# Patient Record
Sex: Female | Born: 1945 | Race: White | Hispanic: No | Smoking: Never smoker
Health system: Southern US, Community
[De-identification: ages and names within clinical notes are randomized; demographics above are authoritative.]

## PROBLEM LIST (undated history)

## (undated) DIAGNOSIS — C9110 Chronic lymphocytic leukemia of B-cell type not having achieved remission: Secondary | ICD-10-CM

## (undated) DIAGNOSIS — J029 Acute pharyngitis, unspecified: Secondary | ICD-10-CM

## (undated) DIAGNOSIS — R0989 Other specified symptoms and signs involving the circulatory and respiratory systems: Secondary | ICD-10-CM

## (undated) DIAGNOSIS — I10 Essential (primary) hypertension: Secondary | ICD-10-CM

## (undated) DIAGNOSIS — F329 Major depressive disorder, single episode, unspecified: Secondary | ICD-10-CM

## (undated) DIAGNOSIS — M1990 Unspecified osteoarthritis, unspecified site: Secondary | ICD-10-CM

## (undated) DIAGNOSIS — F819 Developmental disorder of scholastic skills, unspecified: Secondary | ICD-10-CM

## (undated) DIAGNOSIS — R5383 Other fatigue: Secondary | ICD-10-CM

## (undated) DIAGNOSIS — E785 Hyperlipidemia, unspecified: Secondary | ICD-10-CM

## (undated) DIAGNOSIS — F79 Unspecified intellectual disabilities: Secondary | ICD-10-CM

## (undated) DIAGNOSIS — F32A Depression, unspecified: Secondary | ICD-10-CM

## (undated) HISTORY — PX: ABDOMINAL HYSTERECTOMY: SHX81

## (undated) HISTORY — DX: Chronic lymphocytic leukemia of B-cell type not having achieved remission: C91.10

## (undated) HISTORY — DX: Other fatigue: R53.83

## (undated) HISTORY — DX: Major depressive disorder, single episode, unspecified: F32.9

## (undated) HISTORY — DX: Acute pharyngitis, unspecified: J02.9

## (undated) HISTORY — PX: BREAST REDUCTION SURGERY: SHX8

## (undated) HISTORY — DX: Developmental disorder of scholastic skills, unspecified: F81.9

## (undated) HISTORY — PX: TONSILLECTOMY: SUR1361

## (undated) HISTORY — DX: Essential (primary) hypertension: I10

## (undated) HISTORY — PX: CARPAL TUNNEL RELEASE: SHX101

## (undated) HISTORY — DX: Hyperlipidemia, unspecified: E78.5

## (undated) HISTORY — DX: Depression, unspecified: F32.A

## (undated) HISTORY — PX: REPLACEMENT TOTAL KNEE: SUR1224

## (undated) HISTORY — DX: Unspecified intellectual disabilities: F79

## (undated) HISTORY — DX: Unspecified osteoarthritis, unspecified site: M19.90

---

## 1967-10-19 HISTORY — PX: REDUCTION MAMMAPLASTY: SUR839

## 1973-10-18 HISTORY — PX: TUBAL LIGATION: SHX77

## 2005-01-12 ENCOUNTER — Encounter: Admission: RE | Admit: 2005-01-12 | Discharge: 2005-04-12 | Payer: Self-pay

## 2006-02-18 ENCOUNTER — Encounter: Admission: RE | Admit: 2006-02-18 | Discharge: 2006-02-18 | Payer: Self-pay | Admitting: Family Medicine

## 2006-03-22 ENCOUNTER — Other Ambulatory Visit: Admission: RE | Admit: 2006-03-22 | Discharge: 2006-03-22 | Payer: Self-pay | Admitting: *Deleted

## 2007-02-21 ENCOUNTER — Encounter: Admission: RE | Admit: 2007-02-21 | Discharge: 2007-02-21 | Payer: Self-pay | Admitting: *Deleted

## 2008-02-22 ENCOUNTER — Encounter: Admission: RE | Admit: 2008-02-22 | Discharge: 2008-02-22 | Payer: Self-pay | Admitting: Family Medicine

## 2009-02-24 ENCOUNTER — Encounter: Admission: RE | Admit: 2009-02-24 | Discharge: 2009-02-24 | Payer: Self-pay | Admitting: Family Medicine

## 2009-10-18 HISTORY — PX: BREAST BIOPSY: SHX20

## 2010-03-27 ENCOUNTER — Encounter: Admission: RE | Admit: 2010-03-27 | Discharge: 2010-03-27 | Payer: Self-pay | Admitting: Family Medicine

## 2010-04-01 ENCOUNTER — Encounter: Admission: RE | Admit: 2010-04-01 | Discharge: 2010-04-01 | Payer: Self-pay | Admitting: Family Medicine

## 2010-06-18 ENCOUNTER — Encounter: Admission: RE | Admit: 2010-06-18 | Discharge: 2010-06-18 | Payer: Self-pay | Admitting: Family Medicine

## 2010-07-02 ENCOUNTER — Encounter: Admission: RE | Admit: 2010-07-02 | Discharge: 2010-07-02 | Payer: Self-pay | Admitting: Family Medicine

## 2010-07-07 ENCOUNTER — Ambulatory Visit: Payer: Self-pay | Admitting: Oncology

## 2010-08-04 ENCOUNTER — Other Ambulatory Visit: Admission: RE | Admit: 2010-08-04 | Discharge: 2010-08-04 | Payer: Self-pay | Admitting: Oncology

## 2010-08-04 LAB — CBC WITH DIFFERENTIAL/PLATELET
BASO%: 0.2 % (ref 0.0–2.0)
Basophils Absolute: 0 10*3/uL (ref 0.0–0.1)
EOS%: 0.5 % (ref 0.0–7.0)
Eosinophils Absolute: 0.1 10*3/uL (ref 0.0–0.5)
HCT: 39.1 % (ref 34.8–46.6)
HGB: 13.4 g/dL (ref 11.6–15.9)
LYMPH%: 73.3 % — ABNORMAL HIGH (ref 14.0–49.7)
MCH: 29.5 pg (ref 25.1–34.0)
MCHC: 34.2 g/dL (ref 31.5–36.0)
MCV: 86.3 fL (ref 79.5–101.0)
MONO#: 0.4 10*3/uL (ref 0.1–0.9)
MONO%: 2.7 % (ref 0.0–14.0)
NEUT#: 3.8 10*3/uL (ref 1.5–6.5)
NEUT%: 23.3 % — ABNORMAL LOW (ref 38.4–76.8)
Platelets: 113 10*3/uL — ABNORMAL LOW (ref 145–400)
RBC: 4.53 10*6/uL (ref 3.70–5.45)
RDW: 14.4 % (ref 11.2–14.5)
WBC: 16.2 10*3/uL — ABNORMAL HIGH (ref 3.9–10.3)
lymph#: 11.9 10*3/uL — ABNORMAL HIGH (ref 0.9–3.3)

## 2010-08-04 LAB — COMPREHENSIVE METABOLIC PANEL
ALT: 14 U/L (ref 0–35)
AST: 20 U/L (ref 0–37)
Albumin: 4.5 g/dL (ref 3.5–5.2)
Alkaline Phosphatase: 67 U/L (ref 39–117)
BUN: 24 mg/dL — ABNORMAL HIGH (ref 6–23)
CO2: 29 mEq/L (ref 19–32)
Calcium: 9.6 mg/dL (ref 8.4–10.5)
Chloride: 103 mEq/L (ref 96–112)
Creatinine, Ser: 0.95 mg/dL (ref 0.40–1.20)
Glucose, Bld: 86 mg/dL (ref 70–99)
Potassium: 4.5 mEq/L (ref 3.5–5.3)
Sodium: 141 mEq/L (ref 135–145)
Total Bilirubin: 0.4 mg/dL (ref 0.3–1.2)
Total Protein: 6.3 g/dL (ref 6.0–8.3)

## 2010-08-04 LAB — CHCC SMEAR

## 2010-08-04 LAB — MORPHOLOGY: PLT EST: DECREASED

## 2010-08-04 LAB — LACTATE DEHYDROGENASE: LDH: 127 U/L (ref 94–250)

## 2010-08-05 LAB — FLOW CYTOMETRY

## 2010-08-20 ENCOUNTER — Ambulatory Visit (HOSPITAL_COMMUNITY): Admission: RE | Admit: 2010-08-20 | Discharge: 2010-08-20 | Payer: Self-pay | Admitting: Oncology

## 2010-09-02 ENCOUNTER — Ambulatory Visit (HOSPITAL_COMMUNITY): Admission: RE | Admit: 2010-09-02 | Discharge: 2010-09-02 | Payer: Self-pay | Admitting: General Surgery

## 2010-09-04 ENCOUNTER — Ambulatory Visit: Payer: Self-pay | Admitting: Oncology

## 2010-09-08 LAB — CBC WITH DIFFERENTIAL/PLATELET
BASO%: 0.3 % (ref 0.0–2.0)
Basophils Absolute: 0.1 10*3/uL (ref 0.0–0.1)
EOS%: 0.5 % (ref 0.0–7.0)
Eosinophils Absolute: 0.1 10*3/uL (ref 0.0–0.5)
HCT: 38.5 % (ref 34.8–46.6)
HGB: 13.1 g/dL (ref 11.6–15.9)
LYMPH%: 75.3 % — ABNORMAL HIGH (ref 14.0–49.7)
MCH: 29.2 pg (ref 25.1–34.0)
MCHC: 34 g/dL (ref 31.5–36.0)
MCV: 86 fL (ref 79.5–101.0)
MONO#: 0.8 10*3/uL (ref 0.1–0.9)
MONO%: 3.9 % (ref 0.0–14.0)
NEUT#: 4 10*3/uL (ref 1.5–6.5)
NEUT%: 20 % — ABNORMAL LOW (ref 38.4–76.8)
Platelets: 113 10*3/uL — ABNORMAL LOW (ref 145–400)
RBC: 4.47 10*6/uL (ref 3.70–5.45)
RDW: 14.9 % — ABNORMAL HIGH (ref 11.2–14.5)
WBC: 20.2 10*3/uL — ABNORMAL HIGH (ref 3.9–10.3)
lymph#: 15.2 10*3/uL — ABNORMAL HIGH (ref 0.9–3.3)

## 2010-09-08 LAB — MORPHOLOGY: PLT EST: DECREASED

## 2010-09-08 LAB — CHCC SMEAR

## 2010-09-09 LAB — IGG, IGA, IGM
IgA: 47 mg/dL — ABNORMAL LOW (ref 68–378)
IgG (Immunoglobin G), Serum: 675 mg/dL — ABNORMAL LOW (ref 694–1618)
IgM, Serum: 12 mg/dL — ABNORMAL LOW (ref 60–263)

## 2010-09-09 LAB — BETA 2 MICROGLOBULIN, SERUM: Beta-2 Microglobulin: 3.75 mg/L — ABNORMAL HIGH (ref 1.01–1.73)

## 2010-09-09 LAB — DIRECT ANTIGLOBULIN TEST (NOT AT ARMC)
DAT (Complement): NEGATIVE
DAT IgG: NEGATIVE

## 2010-10-14 ENCOUNTER — Ambulatory Visit: Payer: Self-pay | Admitting: Oncology

## 2010-10-20 ENCOUNTER — Ambulatory Visit: Payer: Self-pay | Admitting: Oncology

## 2010-10-20 LAB — COMPREHENSIVE METABOLIC PANEL
ALT: 18 U/L (ref 0–35)
AST: 22 U/L (ref 0–37)
Albumin: 4.6 g/dL (ref 3.5–5.2)
Alkaline Phosphatase: 67 U/L (ref 39–117)
BUN: 19 mg/dL (ref 6–23)
CO2: 27 mEq/L (ref 19–32)
Calcium: 9.6 mg/dL (ref 8.4–10.5)
Chloride: 105 mEq/L (ref 96–112)
Creatinine, Ser: 0.97 mg/dL (ref 0.40–1.20)
Glucose, Bld: 90 mg/dL (ref 70–99)
Potassium: 4.4 mEq/L (ref 3.5–5.3)
Sodium: 141 mEq/L (ref 135–145)
Total Bilirubin: 0.4 mg/dL (ref 0.3–1.2)
Total Protein: 6.3 g/dL (ref 6.0–8.3)

## 2010-10-20 LAB — MORPHOLOGY: PLT EST: DECREASED

## 2010-10-20 LAB — CBC WITH DIFFERENTIAL/PLATELET
BASO%: 0.2 % (ref 0.0–2.0)
Basophils Absolute: 0.1 10*3/uL (ref 0.0–0.1)
EOS%: 0.4 % (ref 0.0–7.0)
Eosinophils Absolute: 0.1 10*3/uL (ref 0.0–0.5)
HCT: 38.4 % (ref 34.8–46.6)
HGB: 13.1 g/dL (ref 11.6–15.9)
LYMPH%: 84.4 % — ABNORMAL HIGH (ref 14.0–49.7)
MCH: 29.5 pg (ref 25.1–34.0)
MCHC: 34 g/dL (ref 31.5–36.0)
MCV: 86.8 fL (ref 79.5–101.0)
MONO#: 0.6 10*3/uL (ref 0.1–0.9)
MONO%: 2.3 % (ref 0.0–14.0)
NEUT#: 3.3 10*3/uL (ref 1.5–6.5)
NEUT%: 12.7 % — ABNORMAL LOW (ref 38.4–76.8)
Platelets: 106 10*3/uL — ABNORMAL LOW (ref 145–400)
RBC: 4.42 10*6/uL (ref 3.70–5.45)
RDW: 14.9 % — ABNORMAL HIGH (ref 11.2–14.5)
WBC: 25.8 10*3/uL — ABNORMAL HIGH (ref 3.9–10.3)
lymph#: 21.7 10*3/uL — ABNORMAL HIGH (ref 0.9–3.3)

## 2010-10-20 LAB — LACTATE DEHYDROGENASE: LDH: 145 U/L (ref 94–250)

## 2010-11-10 ENCOUNTER — Ambulatory Visit (HOSPITAL_COMMUNITY)
Admission: RE | Admit: 2010-11-10 | Discharge: 2010-11-10 | Payer: Self-pay | Source: Home / Self Care | Attending: Oncology | Admitting: Oncology

## 2010-11-11 LAB — CBC
HCT: 37.7 % (ref 36.0–46.0)
Hemoglobin: 13 g/dL (ref 12.0–15.0)
MCH: 29.2 pg (ref 26.0–34.0)
MCHC: 34.5 g/dL (ref 30.0–36.0)
MCV: 84.7 fL (ref 78.0–100.0)
Platelets: 85 10*3/uL — ABNORMAL LOW (ref 150–400)
RBC: 4.45 MIL/uL (ref 3.87–5.11)
RDW: 13.9 % (ref 11.5–15.5)
WBC: 32.8 10*3/uL — ABNORMAL HIGH (ref 4.0–10.5)

## 2010-11-11 LAB — PROTIME-INR
INR: 1.11 (ref 0.00–1.49)
Prothrombin Time: 14.5 seconds (ref 11.6–15.2)

## 2010-11-11 LAB — APTT: aPTT: 27 seconds (ref 24–37)

## 2010-11-11 LAB — BONE MARROW EXAM: Bone Marrow Exam: 41

## 2010-12-01 ENCOUNTER — Other Ambulatory Visit: Payer: Self-pay | Admitting: Oncology

## 2010-12-01 ENCOUNTER — Encounter (HOSPITAL_BASED_OUTPATIENT_CLINIC_OR_DEPARTMENT_OTHER): Payer: BC Managed Care – PPO | Admitting: Oncology

## 2010-12-01 DIAGNOSIS — D696 Thrombocytopenia, unspecified: Secondary | ICD-10-CM

## 2010-12-01 DIAGNOSIS — C9110 Chronic lymphocytic leukemia of B-cell type not having achieved remission: Secondary | ICD-10-CM

## 2010-12-01 DIAGNOSIS — D7289 Other specified disorders of white blood cells: Secondary | ICD-10-CM

## 2010-12-01 DIAGNOSIS — R599 Enlarged lymph nodes, unspecified: Secondary | ICD-10-CM

## 2010-12-01 LAB — HEPATITIS B SURFACE ANTIGEN: Hepatitis B Surface Ag: NEGATIVE

## 2010-12-01 LAB — CBC WITH DIFFERENTIAL/PLATELET
BASO%: 0.5 % (ref 0.0–2.0)
Basophils Absolute: 0.2 10*3/uL — ABNORMAL HIGH (ref 0.0–0.1)
EOS%: 0.1 % (ref 0.0–7.0)
Eosinophils Absolute: 0 10*3/uL (ref 0.0–0.5)
HCT: 38.8 % (ref 34.8–46.6)
HGB: 12.8 g/dL (ref 11.6–15.9)
LYMPH%: 88.1 % — ABNORMAL HIGH (ref 14.0–49.7)
MCH: 28.8 pg (ref 25.1–34.0)
MCHC: 33 g/dL (ref 31.5–36.0)
MCV: 87.2 fL (ref 79.5–101.0)
MONO#: 0.7 10*3/uL (ref 0.1–0.9)
MONO%: 1.7 % (ref 0.0–14.0)
NEUT#: 3.9 10*3/uL (ref 1.5–6.5)
NEUT%: 9.6 % — ABNORMAL LOW (ref 38.4–76.8)
Platelets: 94 10*3/uL — ABNORMAL LOW (ref 145–400)
RBC: 4.45 10*6/uL (ref 3.70–5.45)
RDW: 14.6 % — ABNORMAL HIGH (ref 11.2–14.5)
WBC: 40.5 10*3/uL — ABNORMAL HIGH (ref 3.9–10.3)
lymph#: 35.6 10*3/uL — ABNORMAL HIGH (ref 0.9–3.3)

## 2010-12-01 LAB — COMPREHENSIVE METABOLIC PANEL
ALT: 15 U/L (ref 0–35)
AST: 22 U/L (ref 0–37)
Albumin: 4.8 g/dL (ref 3.5–5.2)
Alkaline Phosphatase: 60 U/L (ref 39–117)
BUN: 23 mg/dL (ref 6–23)
CO2: 29 mEq/L (ref 19–32)
Calcium: 9.4 mg/dL (ref 8.4–10.5)
Chloride: 101 mEq/L (ref 96–112)
Creatinine, Ser: 0.99 mg/dL (ref 0.40–1.20)
Glucose, Bld: 83 mg/dL (ref 70–99)
Potassium: 4.1 mEq/L (ref 3.5–5.3)
Sodium: 139 mEq/L (ref 135–145)
Total Bilirubin: 0.5 mg/dL (ref 0.3–1.2)
Total Protein: 6.3 g/dL (ref 6.0–8.3)

## 2010-12-01 LAB — HEPATITIS B SURFACE ANTIBODY,QUALITATIVE: Hep B S Ab: NEGATIVE

## 2010-12-01 LAB — HEPATITIS C ANTIBODY: HCV Ab: NEGATIVE

## 2010-12-01 LAB — MORPHOLOGY
PLT EST: DECREASED
RBC Comments: NORMAL

## 2010-12-01 LAB — LACTATE DEHYDROGENASE: LDH: 169 U/L (ref 94–250)

## 2010-12-07 ENCOUNTER — Encounter (HOSPITAL_BASED_OUTPATIENT_CLINIC_OR_DEPARTMENT_OTHER): Payer: BC Managed Care – PPO | Admitting: Oncology

## 2010-12-07 DIAGNOSIS — Z5112 Encounter for antineoplastic immunotherapy: Secondary | ICD-10-CM

## 2010-12-07 DIAGNOSIS — C9110 Chronic lymphocytic leukemia of B-cell type not having achieved remission: Secondary | ICD-10-CM

## 2010-12-07 DIAGNOSIS — Z5111 Encounter for antineoplastic chemotherapy: Secondary | ICD-10-CM

## 2010-12-08 ENCOUNTER — Encounter (HOSPITAL_BASED_OUTPATIENT_CLINIC_OR_DEPARTMENT_OTHER): Payer: BC Managed Care – PPO | Admitting: Oncology

## 2010-12-08 DIAGNOSIS — C9110 Chronic lymphocytic leukemia of B-cell type not having achieved remission: Secondary | ICD-10-CM

## 2010-12-08 DIAGNOSIS — Z5111 Encounter for antineoplastic chemotherapy: Secondary | ICD-10-CM

## 2010-12-09 ENCOUNTER — Encounter (HOSPITAL_BASED_OUTPATIENT_CLINIC_OR_DEPARTMENT_OTHER): Payer: BC Managed Care – PPO | Admitting: Oncology

## 2010-12-09 DIAGNOSIS — Z5111 Encounter for antineoplastic chemotherapy: Secondary | ICD-10-CM

## 2010-12-09 DIAGNOSIS — C9110 Chronic lymphocytic leukemia of B-cell type not having achieved remission: Secondary | ICD-10-CM

## 2010-12-15 ENCOUNTER — Encounter (HOSPITAL_BASED_OUTPATIENT_CLINIC_OR_DEPARTMENT_OTHER): Payer: BC Managed Care – PPO | Admitting: Oncology

## 2010-12-15 ENCOUNTER — Other Ambulatory Visit: Payer: Self-pay | Admitting: Oncology

## 2010-12-15 DIAGNOSIS — D7289 Other specified disorders of white blood cells: Secondary | ICD-10-CM

## 2010-12-15 DIAGNOSIS — C9110 Chronic lymphocytic leukemia of B-cell type not having achieved remission: Secondary | ICD-10-CM

## 2010-12-15 DIAGNOSIS — D696 Thrombocytopenia, unspecified: Secondary | ICD-10-CM

## 2010-12-15 DIAGNOSIS — R599 Enlarged lymph nodes, unspecified: Secondary | ICD-10-CM

## 2010-12-15 DIAGNOSIS — Z5189 Encounter for other specified aftercare: Secondary | ICD-10-CM

## 2010-12-15 LAB — CBC WITH DIFFERENTIAL/PLATELET
BASO%: 0.4 % (ref 0.0–2.0)
Basophils Absolute: 0 10*3/uL (ref 0.0–0.1)
EOS%: 1 % (ref 0.0–7.0)
Eosinophils Absolute: 0.1 10*3/uL (ref 0.0–0.5)
HCT: 35.8 % (ref 34.8–46.6)
HGB: 12.4 g/dL (ref 11.6–15.9)
LYMPH%: 76.3 % — ABNORMAL HIGH (ref 14.0–49.7)
MCH: 29.2 pg (ref 25.1–34.0)
MCHC: 34.5 g/dL (ref 31.5–36.0)
MCV: 84.8 fL (ref 79.5–101.0)
MONO#: 0.2 10*3/uL (ref 0.1–0.9)
MONO%: 2.9 % (ref 0.0–14.0)
NEUT#: 1.5 10*3/uL (ref 1.5–6.5)
NEUT%: 19.4 % — ABNORMAL LOW (ref 38.4–76.8)
Platelets: 94 10*3/uL — ABNORMAL LOW (ref 145–400)
RBC: 4.23 10*6/uL (ref 3.70–5.45)
RDW: 14.6 % — ABNORMAL HIGH (ref 11.2–14.5)
WBC: 8 10*3/uL (ref 3.9–10.3)
lymph#: 6.1 10*3/uL — ABNORMAL HIGH (ref 0.9–3.3)

## 2010-12-15 LAB — COMPREHENSIVE METABOLIC PANEL
ALT: 20 U/L (ref 0–35)
AST: 22 U/L (ref 0–37)
Albumin: 4.1 g/dL (ref 3.5–5.2)
Alkaline Phosphatase: 53 U/L (ref 39–117)
BUN: 17 mg/dL (ref 6–23)
CO2: 30 mEq/L (ref 19–32)
Calcium: 9 mg/dL (ref 8.4–10.5)
Chloride: 102 mEq/L (ref 96–112)
Creatinine, Ser: 0.91 mg/dL (ref 0.40–1.20)
Glucose, Bld: 89 mg/dL (ref 70–99)
Potassium: 4.4 mEq/L (ref 3.5–5.3)
Sodium: 138 mEq/L (ref 135–145)
Total Bilirubin: 0.4 mg/dL (ref 0.3–1.2)
Total Protein: 6.1 g/dL (ref 6.0–8.3)

## 2010-12-15 LAB — URIC ACID: Uric Acid, Serum: 6.3 mg/dL (ref 2.4–7.0)

## 2010-12-15 LAB — LACTATE DEHYDROGENASE: LDH: 127 U/L (ref 94–250)

## 2010-12-22 ENCOUNTER — Encounter (HOSPITAL_BASED_OUTPATIENT_CLINIC_OR_DEPARTMENT_OTHER): Payer: BC Managed Care – PPO | Admitting: Oncology

## 2010-12-22 ENCOUNTER — Other Ambulatory Visit: Payer: Self-pay | Admitting: Oncology

## 2010-12-22 DIAGNOSIS — C9110 Chronic lymphocytic leukemia of B-cell type not having achieved remission: Secondary | ICD-10-CM

## 2010-12-22 LAB — BASIC METABOLIC PANEL
BUN: 16 mg/dL (ref 6–23)
CO2: 28 mEq/L (ref 19–32)
Calcium: 9.3 mg/dL (ref 8.4–10.5)
Chloride: 102 mEq/L (ref 96–112)
Creatinine, Ser: 0.97 mg/dL (ref 0.40–1.20)
Glucose, Bld: 98 mg/dL (ref 70–99)
Potassium: 4 mEq/L (ref 3.5–5.3)
Sodium: 141 mEq/L (ref 135–145)

## 2010-12-22 LAB — CBC WITH DIFFERENTIAL/PLATELET
BASO%: 0.4 % (ref 0.0–2.0)
Basophils Absolute: 0.1 10*3/uL (ref 0.0–0.1)
EOS%: 0.4 % (ref 0.0–7.0)
Eosinophils Absolute: 0.1 10*3/uL (ref 0.0–0.5)
HCT: 37 % (ref 34.8–46.6)
HGB: 12.4 g/dL (ref 11.6–15.9)
LYMPH%: 50.1 % — ABNORMAL HIGH (ref 14.0–49.7)
MCH: 29.3 pg (ref 25.1–34.0)
MCHC: 33.6 g/dL (ref 31.5–36.0)
MCV: 87.2 fL (ref 79.5–101.0)
MONO#: 1.5 10*3/uL — ABNORMAL HIGH (ref 0.1–0.9)
MONO%: 5.7 % (ref 0.0–14.0)
NEUT#: 11.4 10*3/uL — ABNORMAL HIGH (ref 1.5–6.5)
NEUT%: 43.4 % (ref 38.4–76.8)
Platelets: 91 10*3/uL — ABNORMAL LOW (ref 145–400)
RBC: 4.25 10*6/uL (ref 3.70–5.45)
RDW: 15.7 % — ABNORMAL HIGH (ref 11.2–14.5)
WBC: 26.3 10*3/uL — ABNORMAL HIGH (ref 3.9–10.3)
lymph#: 13.2 10*3/uL — ABNORMAL HIGH (ref 0.9–3.3)

## 2010-12-29 ENCOUNTER — Other Ambulatory Visit: Payer: Self-pay | Admitting: Oncology

## 2010-12-29 ENCOUNTER — Encounter (HOSPITAL_BASED_OUTPATIENT_CLINIC_OR_DEPARTMENT_OTHER): Payer: BC Managed Care – PPO | Admitting: Oncology

## 2010-12-29 DIAGNOSIS — C9110 Chronic lymphocytic leukemia of B-cell type not having achieved remission: Secondary | ICD-10-CM

## 2010-12-29 LAB — CBC WITH DIFFERENTIAL/PLATELET
BASO%: 0.2 % (ref 0.0–2.0)
Basophils Absolute: 0 10*3/uL (ref 0.0–0.1)
EOS%: 0.5 % (ref 0.0–7.0)
Eosinophils Absolute: 0.1 10*3/uL (ref 0.0–0.5)
HCT: 37.6 % (ref 34.8–46.6)
HGB: 12.8 g/dL (ref 11.6–15.9)
LYMPH%: 65.9 % — ABNORMAL HIGH (ref 14.0–49.7)
MCH: 29.6 pg (ref 25.1–34.0)
MCHC: 33.9 g/dL (ref 31.5–36.0)
MCV: 87.4 fL (ref 79.5–101.0)
MONO#: 0.3 10*3/uL (ref 0.1–0.9)
MONO%: 1 % (ref 0.0–14.0)
NEUT#: 9.3 10*3/uL — ABNORMAL HIGH (ref 1.5–6.5)
NEUT%: 32.4 % — ABNORMAL LOW (ref 38.4–76.8)
Platelets: 108 10*3/uL — ABNORMAL LOW (ref 145–400)
RBC: 4.3 10*6/uL (ref 3.70–5.45)
RDW: 16 % — ABNORMAL HIGH (ref 11.2–14.5)
WBC: 28.9 10*3/uL — ABNORMAL HIGH (ref 3.9–10.3)
lymph#: 19 10*3/uL — ABNORMAL HIGH (ref 0.9–3.3)

## 2010-12-29 LAB — CBC
HCT: 38.3 % (ref 36.0–46.0)
Hemoglobin: 12.8 g/dL (ref 12.0–15.0)
MCH: 28.8 pg (ref 26.0–34.0)
MCHC: 33.4 g/dL (ref 30.0–36.0)
MCV: 86.3 fL (ref 78.0–100.0)
Platelets: 112 10*3/uL — ABNORMAL LOW (ref 150–400)
RBC: 4.44 MIL/uL (ref 3.87–5.11)
RDW: 14 % (ref 11.5–15.5)
WBC: 22.5 10*3/uL — ABNORMAL HIGH (ref 4.0–10.5)

## 2010-12-29 LAB — COMPREHENSIVE METABOLIC PANEL
ALT: 20 U/L (ref 0–35)
AST: 23 U/L (ref 0–37)
Albumin: 4.5 g/dL (ref 3.5–5.2)
Alkaline Phosphatase: 84 U/L (ref 39–117)
BUN: 16 mg/dL (ref 6–23)
CO2: 28 mEq/L (ref 19–32)
Calcium: 9.5 mg/dL (ref 8.4–10.5)
Chloride: 104 mEq/L (ref 96–112)
Creatinine, Ser: 0.97 mg/dL (ref 0.40–1.20)
Glucose, Bld: 97 mg/dL (ref 70–99)
Potassium: 4.2 mEq/L (ref 3.5–5.3)
Sodium: 142 mEq/L (ref 135–145)
Total Bilirubin: 0.4 mg/dL (ref 0.3–1.2)
Total Protein: 6 g/dL (ref 6.0–8.3)

## 2010-12-29 LAB — BASIC METABOLIC PANEL
BUN: 18 mg/dL (ref 6–23)
CO2: 31 mEq/L (ref 19–32)
Calcium: 9.5 mg/dL (ref 8.4–10.5)
Chloride: 103 mEq/L (ref 96–112)
Creatinine, Ser: 1 mg/dL (ref 0.4–1.2)
GFR calc Af Amer: >60 mL/min (ref >60)
GFR calc non Af Amer: 56 mL/min — ABNORMAL LOW (ref >60)
Glucose, Bld: 93 mg/dL (ref 70–99)
Potassium: 4.2 mEq/L (ref 3.5–5.1)
Sodium: 140 mEq/L (ref 135–145)

## 2010-12-29 LAB — SURGICAL PCR SCREEN
MRSA, PCR: NEGATIVE
Staphylococcus aureus: NEGATIVE

## 2010-12-29 LAB — LACTATE DEHYDROGENASE: LDH: 177 U/L (ref 94–250)

## 2010-12-29 LAB — URIC ACID: Uric Acid, Serum: 8.1 mg/dL — ABNORMAL HIGH (ref 2.4–7.0)

## 2010-12-29 LAB — TECHNOLOGIST REVIEW

## 2011-01-04 ENCOUNTER — Encounter (HOSPITAL_BASED_OUTPATIENT_CLINIC_OR_DEPARTMENT_OTHER): Payer: Medicare Other | Admitting: Oncology

## 2011-01-04 ENCOUNTER — Other Ambulatory Visit: Payer: Self-pay | Admitting: Oncology

## 2011-01-04 DIAGNOSIS — Z5112 Encounter for antineoplastic immunotherapy: Secondary | ICD-10-CM

## 2011-01-04 DIAGNOSIS — C9110 Chronic lymphocytic leukemia of B-cell type not having achieved remission: Secondary | ICD-10-CM

## 2011-01-04 DIAGNOSIS — Z5111 Encounter for antineoplastic chemotherapy: Secondary | ICD-10-CM

## 2011-01-04 LAB — CBC WITH DIFFERENTIAL/PLATELET
BASO%: 3.5 % — ABNORMAL HIGH (ref 0.0–2.0)
Basophils Absolute: 0.7 10*3/uL — ABNORMAL HIGH (ref 0.0–0.1)
EOS%: 0.9 % (ref 0.0–7.0)
Eosinophils Absolute: 0.2 10*3/uL (ref 0.0–0.5)
HCT: 36 % (ref 34.8–46.6)
HGB: 12.5 g/dL (ref 11.6–15.9)
LYMPH%: 73.3 % — ABNORMAL HIGH (ref 14.0–49.7)
MCH: 30.2 pg (ref 25.1–34.0)
MCHC: 34.7 g/dL (ref 31.5–36.0)
MCV: 87.1 fL (ref 79.5–101.0)
MONO#: 0.3 10*3/uL (ref 0.1–0.9)
MONO%: 1.8 % (ref 0.0–14.0)
NEUT#: 3.9 10*3/uL (ref 1.5–6.5)
NEUT%: 20.5 % — ABNORMAL LOW (ref 38.4–76.8)
Platelets: 92 10*3/uL — ABNORMAL LOW (ref 145–400)
RBC: 4.13 10*6/uL (ref 3.70–5.45)
RDW: 16.8 % — ABNORMAL HIGH (ref 11.2–14.5)
WBC: 19 10*3/uL — ABNORMAL HIGH (ref 3.9–10.3)
lymph#: 13.9 10*3/uL — ABNORMAL HIGH (ref 0.9–3.3)

## 2011-01-05 ENCOUNTER — Encounter (HOSPITAL_BASED_OUTPATIENT_CLINIC_OR_DEPARTMENT_OTHER): Payer: Medicare Other | Admitting: Oncology

## 2011-01-05 DIAGNOSIS — C9110 Chronic lymphocytic leukemia of B-cell type not having achieved remission: Secondary | ICD-10-CM

## 2011-01-05 DIAGNOSIS — Z5111 Encounter for antineoplastic chemotherapy: Secondary | ICD-10-CM

## 2011-01-06 ENCOUNTER — Encounter (HOSPITAL_BASED_OUTPATIENT_CLINIC_OR_DEPARTMENT_OTHER): Payer: Medicare Other | Admitting: Oncology

## 2011-01-06 DIAGNOSIS — Z5111 Encounter for antineoplastic chemotherapy: Secondary | ICD-10-CM

## 2011-01-06 DIAGNOSIS — C9110 Chronic lymphocytic leukemia of B-cell type not having achieved remission: Secondary | ICD-10-CM

## 2011-01-11 ENCOUNTER — Encounter (HOSPITAL_BASED_OUTPATIENT_CLINIC_OR_DEPARTMENT_OTHER): Payer: Medicare Other | Admitting: Oncology

## 2011-01-11 DIAGNOSIS — C9110 Chronic lymphocytic leukemia of B-cell type not having achieved remission: Secondary | ICD-10-CM

## 2011-01-18 ENCOUNTER — Other Ambulatory Visit: Payer: Self-pay | Admitting: Oncology

## 2011-01-18 ENCOUNTER — Encounter (HOSPITAL_BASED_OUTPATIENT_CLINIC_OR_DEPARTMENT_OTHER): Payer: Medicare Other | Admitting: Oncology

## 2011-01-18 DIAGNOSIS — D7289 Other specified disorders of white blood cells: Secondary | ICD-10-CM

## 2011-01-18 DIAGNOSIS — C9110 Chronic lymphocytic leukemia of B-cell type not having achieved remission: Secondary | ICD-10-CM

## 2011-01-18 LAB — CBC WITH DIFFERENTIAL/PLATELET
BASO%: 2.1 % — ABNORMAL HIGH (ref 0.0–2.0)
Basophils Absolute: 0.4 10*3/uL — ABNORMAL HIGH (ref 0.0–0.1)
EOS%: 0.6 % (ref 0.0–7.0)
Eosinophils Absolute: 0.1 10*3/uL (ref 0.0–0.5)
HCT: 38.2 % (ref 34.8–46.6)
HGB: 13 g/dL (ref 11.6–15.9)
LYMPH%: 28.7 % (ref 14.0–49.7)
MCH: 30.6 pg (ref 25.1–34.0)
MCHC: 34.2 g/dL (ref 31.5–36.0)
MCV: 89.5 fL (ref 79.5–101.0)
MONO#: 0.1 10*3/uL (ref 0.1–0.9)
MONO%: 0.9 % (ref 0.0–14.0)
NEUT#: 11.2 10*3/uL — ABNORMAL HIGH (ref 1.5–6.5)
NEUT%: 67.7 % (ref 38.4–76.8)
Platelets: 97 10*3/uL — ABNORMAL LOW (ref 145–400)
RBC: 4.27 10*6/uL (ref 3.70–5.45)
RDW: 18.9 % — ABNORMAL HIGH (ref 11.2–14.5)
WBC: 16.6 10*3/uL — ABNORMAL HIGH (ref 3.9–10.3)
lymph#: 4.8 10*3/uL — ABNORMAL HIGH (ref 0.9–3.3)

## 2011-01-18 LAB — COMPREHENSIVE METABOLIC PANEL
ALT: 11 U/L (ref 0–35)
AST: 20 U/L (ref 0–37)
Albumin: 4.9 g/dL (ref 3.5–5.2)
Alkaline Phosphatase: 96 U/L (ref 39–117)
BUN: 15 mg/dL (ref 6–23)
CO2: 30 mEq/L (ref 19–32)
Calcium: 10.5 mg/dL (ref 8.4–10.5)
Chloride: 101 mEq/L (ref 96–112)
Creatinine, Ser: 1.07 mg/dL (ref 0.40–1.20)
Glucose, Bld: 99 mg/dL (ref 70–99)
Potassium: 4 mEq/L (ref 3.5–5.3)
Sodium: 141 mEq/L (ref 135–145)
Total Bilirubin: 0.6 mg/dL (ref 0.3–1.2)
Total Protein: 6.5 g/dL (ref 6.0–8.3)

## 2011-01-18 LAB — URIC ACID: Uric Acid, Serum: 8 mg/dL — ABNORMAL HIGH (ref 2.4–7.0)

## 2011-02-01 ENCOUNTER — Other Ambulatory Visit: Payer: Self-pay | Admitting: Oncology

## 2011-02-01 ENCOUNTER — Encounter (HOSPITAL_BASED_OUTPATIENT_CLINIC_OR_DEPARTMENT_OTHER): Payer: Medicare Other | Admitting: Oncology

## 2011-02-01 DIAGNOSIS — D696 Thrombocytopenia, unspecified: Secondary | ICD-10-CM

## 2011-02-01 DIAGNOSIS — C9110 Chronic lymphocytic leukemia of B-cell type not having achieved remission: Secondary | ICD-10-CM

## 2011-02-01 DIAGNOSIS — Z5112 Encounter for antineoplastic immunotherapy: Secondary | ICD-10-CM

## 2011-02-01 DIAGNOSIS — Z5111 Encounter for antineoplastic chemotherapy: Secondary | ICD-10-CM

## 2011-02-01 LAB — CBC WITH DIFFERENTIAL/PLATELET
BASO%: 0.3 % (ref 0.0–2.0)
Basophils Absolute: 0 10*3/uL (ref 0.0–0.1)
EOS%: 0.4 % (ref 0.0–7.0)
Eosinophils Absolute: 0 10*3/uL (ref 0.0–0.5)
HCT: 33.8 % — ABNORMAL LOW (ref 34.8–46.6)
HGB: 11.4 g/dL — ABNORMAL LOW (ref 11.6–15.9)
LYMPH%: 71 % — ABNORMAL HIGH (ref 14.0–49.7)
MCH: 29.5 pg (ref 25.1–34.0)
MCHC: 33.7 g/dL (ref 31.5–36.0)
MCV: 87.6 fL (ref 79.5–101.0)
MONO#: 1.1 10*3/uL — ABNORMAL HIGH (ref 0.1–0.9)
MONO%: 10.2 % (ref 0.0–14.0)
NEUT#: 1.9 10*3/uL (ref 1.5–6.5)
NEUT%: 18.1 % — ABNORMAL LOW (ref 38.4–76.8)
Platelets: 98 10*3/uL — ABNORMAL LOW (ref 145–400)
RBC: 3.86 10*6/uL (ref 3.70–5.45)
RDW: 17.5 % — ABNORMAL HIGH (ref 11.2–14.5)
WBC: 10.7 10*3/uL — ABNORMAL HIGH (ref 3.9–10.3)
lymph#: 7.6 10*3/uL — ABNORMAL HIGH (ref 0.9–3.3)
nRBC: 0 % (ref 0–0)

## 2011-02-01 LAB — TECHNOLOGIST REVIEW

## 2011-02-02 ENCOUNTER — Encounter (HOSPITAL_BASED_OUTPATIENT_CLINIC_OR_DEPARTMENT_OTHER): Payer: Medicare Other | Admitting: Oncology

## 2011-02-02 DIAGNOSIS — Z5111 Encounter for antineoplastic chemotherapy: Secondary | ICD-10-CM

## 2011-02-02 DIAGNOSIS — C9110 Chronic lymphocytic leukemia of B-cell type not having achieved remission: Secondary | ICD-10-CM

## 2011-02-03 ENCOUNTER — Encounter (HOSPITAL_BASED_OUTPATIENT_CLINIC_OR_DEPARTMENT_OTHER): Payer: Medicare Other | Admitting: Oncology

## 2011-02-03 DIAGNOSIS — C9110 Chronic lymphocytic leukemia of B-cell type not having achieved remission: Secondary | ICD-10-CM

## 2011-02-03 DIAGNOSIS — Z5111 Encounter for antineoplastic chemotherapy: Secondary | ICD-10-CM

## 2011-02-08 ENCOUNTER — Encounter (HOSPITAL_BASED_OUTPATIENT_CLINIC_OR_DEPARTMENT_OTHER): Payer: Medicare Other | Admitting: Oncology

## 2011-02-08 DIAGNOSIS — C9110 Chronic lymphocytic leukemia of B-cell type not having achieved remission: Secondary | ICD-10-CM

## 2011-02-22 ENCOUNTER — Other Ambulatory Visit: Payer: Self-pay | Admitting: Oncology

## 2011-02-22 ENCOUNTER — Encounter (HOSPITAL_BASED_OUTPATIENT_CLINIC_OR_DEPARTMENT_OTHER): Payer: Medicare Other | Admitting: Oncology

## 2011-02-22 DIAGNOSIS — Z5189 Encounter for other specified aftercare: Secondary | ICD-10-CM

## 2011-02-22 DIAGNOSIS — C9110 Chronic lymphocytic leukemia of B-cell type not having achieved remission: Secondary | ICD-10-CM

## 2011-02-22 LAB — CBC WITH DIFFERENTIAL/PLATELET
BASO%: 0.3 % (ref 0.0–2.0)
Basophils Absolute: 0 10*3/uL (ref 0.0–0.1)
EOS%: 0.4 % (ref 0.0–7.0)
Eosinophils Absolute: 0 10*3/uL (ref 0.0–0.5)
HCT: 36.3 % (ref 34.8–46.6)
HGB: 12.7 g/dL (ref 11.6–15.9)
LYMPH%: 28 % (ref 14.0–49.7)
MCH: 31.9 pg (ref 25.1–34.0)
MCHC: 34.9 g/dL (ref 31.5–36.0)
MCV: 91.4 fL (ref 79.5–101.0)
MONO#: 0.2 10*3/uL (ref 0.1–0.9)
MONO%: 2.5 % (ref 0.0–14.0)
NEUT#: 6.8 10*3/uL — ABNORMAL HIGH (ref 1.5–6.5)
NEUT%: 68.8 % (ref 38.4–76.8)
Platelets: 97 10*3/uL — ABNORMAL LOW (ref 145–400)
RBC: 3.97 10*6/uL (ref 3.70–5.45)
RDW: 20.4 % — ABNORMAL HIGH (ref 11.2–14.5)
WBC: 9.9 10*3/uL (ref 3.9–10.3)
lymph#: 2.8 10*3/uL (ref 0.9–3.3)

## 2011-02-22 LAB — COMPREHENSIVE METABOLIC PANEL
ALT: 12 U/L (ref 0–35)
AST: 18 U/L (ref 0–37)
Albumin: 4.7 g/dL (ref 3.5–5.2)
Alkaline Phosphatase: 83 U/L (ref 39–117)
BUN: 27 mg/dL — ABNORMAL HIGH (ref 6–23)
CO2: 29 mEq/L (ref 19–32)
Calcium: 9.9 mg/dL (ref 8.4–10.5)
Chloride: 103 mEq/L (ref 96–112)
Creatinine, Ser: 0.96 mg/dL (ref 0.40–1.20)
Glucose, Bld: 86 mg/dL (ref 70–99)
Potassium: 4.2 mEq/L (ref 3.5–5.3)
Sodium: 142 mEq/L (ref 135–145)
Total Bilirubin: 0.5 mg/dL (ref 0.3–1.2)
Total Protein: 6.5 g/dL (ref 6.0–8.3)

## 2011-02-22 LAB — MORPHOLOGY: PLT EST: DECREASED

## 2011-02-22 LAB — LACTATE DEHYDROGENASE: LDH: 135 U/L (ref 94–250)

## 2011-02-22 LAB — URIC ACID: Uric Acid, Serum: 6.8 mg/dL (ref 2.4–7.0)

## 2011-02-26 ENCOUNTER — Other Ambulatory Visit: Payer: Self-pay | Admitting: Family Medicine

## 2011-02-26 DIAGNOSIS — Z1231 Encounter for screening mammogram for malignant neoplasm of breast: Secondary | ICD-10-CM

## 2011-03-01 ENCOUNTER — Other Ambulatory Visit: Payer: Self-pay | Admitting: Oncology

## 2011-03-01 ENCOUNTER — Encounter (HOSPITAL_BASED_OUTPATIENT_CLINIC_OR_DEPARTMENT_OTHER): Payer: Medicare Other | Admitting: Oncology

## 2011-03-01 DIAGNOSIS — Z5112 Encounter for antineoplastic immunotherapy: Secondary | ICD-10-CM

## 2011-03-01 DIAGNOSIS — C9110 Chronic lymphocytic leukemia of B-cell type not having achieved remission: Secondary | ICD-10-CM

## 2011-03-01 DIAGNOSIS — Z5111 Encounter for antineoplastic chemotherapy: Secondary | ICD-10-CM

## 2011-03-01 DIAGNOSIS — Z5189 Encounter for other specified aftercare: Secondary | ICD-10-CM

## 2011-03-01 LAB — CBC WITH DIFFERENTIAL/PLATELET
BASO%: 0.2 % (ref 0.0–2.0)
Basophils Absolute: 0 10*3/uL (ref 0.0–0.1)
EOS%: 0.4 % (ref 0.0–7.0)
Eosinophils Absolute: 0 10*3/uL (ref 0.0–0.5)
HCT: 35 % (ref 34.8–46.6)
HGB: 12.2 g/dL (ref 11.6–15.9)
LYMPH%: 45 % (ref 14.0–49.7)
MCH: 30.6 pg (ref 25.1–34.0)
MCHC: 34.9 g/dL (ref 31.5–36.0)
MCV: 87.7 fL (ref 79.5–101.0)
MONO#: 0.8 10*3/uL (ref 0.1–0.9)
MONO%: 9.7 % (ref 0.0–14.0)
NEUT#: 3.6 10*3/uL (ref 1.5–6.5)
NEUT%: 44.7 % (ref 38.4–76.8)
Platelets: 107 10*3/uL — ABNORMAL LOW (ref 145–400)
RBC: 3.99 10*6/uL (ref 3.70–5.45)
RDW: 17.3 % — ABNORMAL HIGH (ref 11.2–14.5)
WBC: 8.1 10*3/uL (ref 3.9–10.3)
lymph#: 3.7 10*3/uL — ABNORMAL HIGH (ref 0.9–3.3)

## 2011-03-02 ENCOUNTER — Encounter (HOSPITAL_BASED_OUTPATIENT_CLINIC_OR_DEPARTMENT_OTHER): Payer: Medicare Other | Admitting: Oncology

## 2011-03-02 DIAGNOSIS — C9110 Chronic lymphocytic leukemia of B-cell type not having achieved remission: Secondary | ICD-10-CM

## 2011-03-02 DIAGNOSIS — Z5111 Encounter for antineoplastic chemotherapy: Secondary | ICD-10-CM

## 2011-03-03 ENCOUNTER — Encounter (HOSPITAL_BASED_OUTPATIENT_CLINIC_OR_DEPARTMENT_OTHER): Payer: Medicare Other | Admitting: Oncology

## 2011-03-03 DIAGNOSIS — C9110 Chronic lymphocytic leukemia of B-cell type not having achieved remission: Secondary | ICD-10-CM

## 2011-03-03 DIAGNOSIS — Z5111 Encounter for antineoplastic chemotherapy: Secondary | ICD-10-CM

## 2011-03-08 ENCOUNTER — Encounter (HOSPITAL_BASED_OUTPATIENT_CLINIC_OR_DEPARTMENT_OTHER): Payer: Medicare Other | Admitting: Oncology

## 2011-03-08 DIAGNOSIS — C9110 Chronic lymphocytic leukemia of B-cell type not having achieved remission: Secondary | ICD-10-CM

## 2011-03-24 ENCOUNTER — Other Ambulatory Visit: Payer: Self-pay | Admitting: Oncology

## 2011-03-24 ENCOUNTER — Encounter (HOSPITAL_BASED_OUTPATIENT_CLINIC_OR_DEPARTMENT_OTHER): Payer: Medicare Other | Admitting: Oncology

## 2011-03-24 DIAGNOSIS — R599 Enlarged lymph nodes, unspecified: Secondary | ICD-10-CM

## 2011-03-24 DIAGNOSIS — C9110 Chronic lymphocytic leukemia of B-cell type not having achieved remission: Secondary | ICD-10-CM

## 2011-03-24 LAB — CBC WITH DIFFERENTIAL/PLATELET
BASO%: 0.1 % (ref 0.0–2.0)
Basophils Absolute: 0 10*3/uL (ref 0.0–0.1)
EOS%: 0.6 % (ref 0.0–7.0)
Eosinophils Absolute: 0 10*3/uL (ref 0.0–0.5)
HCT: 35.7 % (ref 34.8–46.6)
HGB: 12.4 g/dL (ref 11.6–15.9)
LYMPH%: 25.2 % (ref 14.0–49.7)
MCH: 32.8 pg (ref 25.1–34.0)
MCHC: 34.7 g/dL (ref 31.5–36.0)
MCV: 94.3 fL (ref 79.5–101.0)
MONO#: 0.4 10*3/uL (ref 0.1–0.9)
MONO%: 5.4 % (ref 0.0–14.0)
NEUT#: 4.6 10*3/uL (ref 1.5–6.5)
NEUT%: 68.7 % (ref 38.4–76.8)
Platelets: 108 10*3/uL — ABNORMAL LOW (ref 145–400)
RBC: 3.79 10*6/uL (ref 3.70–5.45)
RDW: 18.4 % — ABNORMAL HIGH (ref 11.2–14.5)
WBC: 6.7 10*3/uL (ref 3.9–10.3)
lymph#: 1.7 10*3/uL (ref 0.9–3.3)

## 2011-03-24 LAB — MORPHOLOGY: PLT EST: DECREASED

## 2011-03-24 LAB — COMPREHENSIVE METABOLIC PANEL
ALT: 16 U/L (ref 0–35)
AST: 19 U/L (ref 0–37)
Albumin: 4.5 g/dL (ref 3.5–5.2)
Alkaline Phosphatase: 86 U/L (ref 39–117)
BUN: 29 mg/dL — ABNORMAL HIGH (ref 6–23)
CO2: 31 mEq/L (ref 19–32)
Calcium: 9.5 mg/dL (ref 8.4–10.5)
Chloride: 103 mEq/L (ref 96–112)
Creatinine, Ser: 0.98 mg/dL (ref 0.50–1.10)
Glucose, Bld: 83 mg/dL (ref 70–99)
Potassium: 4.1 mEq/L (ref 3.5–5.3)
Sodium: 141 mEq/L (ref 135–145)
Total Bilirubin: 0.6 mg/dL (ref 0.3–1.2)
Total Protein: 6.2 g/dL (ref 6.0–8.3)

## 2011-03-24 LAB — LACTATE DEHYDROGENASE: LDH: 133 U/L (ref 94–250)

## 2011-03-24 LAB — URIC ACID: Uric Acid, Serum: 6.7 mg/dL (ref 2.4–7.0)

## 2011-04-13 ENCOUNTER — Ambulatory Visit
Admission: RE | Admit: 2011-04-13 | Discharge: 2011-04-13 | Disposition: A | Discharge status: Home / Self Care | Payer: Medicare Other | Source: Ambulatory Visit | Attending: Family Medicine | Admitting: Family Medicine

## 2011-04-13 DIAGNOSIS — Z1231 Encounter for screening mammogram for malignant neoplasm of breast: Secondary | ICD-10-CM

## 2011-04-14 ENCOUNTER — Other Ambulatory Visit: Payer: Self-pay | Admitting: Oncology

## 2011-04-14 ENCOUNTER — Encounter (HOSPITAL_BASED_OUTPATIENT_CLINIC_OR_DEPARTMENT_OTHER): Payer: Medicare Other | Admitting: Oncology

## 2011-04-14 DIAGNOSIS — C9110 Chronic lymphocytic leukemia of B-cell type not having achieved remission: Secondary | ICD-10-CM

## 2011-04-14 DIAGNOSIS — Z5111 Encounter for antineoplastic chemotherapy: Secondary | ICD-10-CM

## 2011-04-14 LAB — CBC WITH DIFFERENTIAL/PLATELET
BASO%: 0.3 % (ref 0.0–2.0)
Basophils Absolute: 0 10*3/uL (ref 0.0–0.1)
EOS%: 1 % (ref 0.0–7.0)
Eosinophils Absolute: 0.1 10*3/uL (ref 0.0–0.5)
HCT: 35.7 % (ref 34.8–46.6)
HGB: 12.5 g/dL (ref 11.6–15.9)
LYMPH%: 58.2 % — ABNORMAL HIGH (ref 14.0–49.7)
MCH: 31.6 pg (ref 25.1–34.0)
MCHC: 35 g/dL (ref 31.5–36.0)
MCV: 90.4 fL (ref 79.5–101.0)
MONO#: 1.3 10*3/uL — ABNORMAL HIGH (ref 0.1–0.9)
MONO%: 14 % (ref 0.0–14.0)
NEUT#: 2.4 10*3/uL (ref 1.5–6.5)
NEUT%: 26.5 % — ABNORMAL LOW (ref 38.4–76.8)
Platelets: 96 10*3/uL — ABNORMAL LOW (ref 145–400)
RBC: 3.95 10*6/uL (ref 3.70–5.45)
RDW: 14 % (ref 11.2–14.5)
WBC: 9.2 10*3/uL (ref 3.9–10.3)
lymph#: 5.4 10*3/uL — ABNORMAL HIGH (ref 0.9–3.3)
nRBC: 0 % (ref 0–0)

## 2011-04-14 LAB — TECHNOLOGIST REVIEW

## 2011-04-15 ENCOUNTER — Encounter (HOSPITAL_BASED_OUTPATIENT_CLINIC_OR_DEPARTMENT_OTHER): Payer: Medicare Other | Admitting: Oncology

## 2011-04-15 DIAGNOSIS — Z5111 Encounter for antineoplastic chemotherapy: Secondary | ICD-10-CM

## 2011-04-15 DIAGNOSIS — C9110 Chronic lymphocytic leukemia of B-cell type not having achieved remission: Secondary | ICD-10-CM

## 2011-04-16 ENCOUNTER — Encounter (HOSPITAL_BASED_OUTPATIENT_CLINIC_OR_DEPARTMENT_OTHER): Payer: Medicare Other | Admitting: Oncology

## 2011-04-16 DIAGNOSIS — C9110 Chronic lymphocytic leukemia of B-cell type not having achieved remission: Secondary | ICD-10-CM

## 2011-04-16 DIAGNOSIS — Z5111 Encounter for antineoplastic chemotherapy: Secondary | ICD-10-CM

## 2011-04-19 ENCOUNTER — Encounter (HOSPITAL_BASED_OUTPATIENT_CLINIC_OR_DEPARTMENT_OTHER): Payer: Medicare Other | Admitting: Oncology

## 2011-04-19 DIAGNOSIS — Z5111 Encounter for antineoplastic chemotherapy: Secondary | ICD-10-CM

## 2011-04-19 DIAGNOSIS — C9110 Chronic lymphocytic leukemia of B-cell type not having achieved remission: Secondary | ICD-10-CM

## 2011-04-28 ENCOUNTER — Other Ambulatory Visit: Payer: Self-pay | Admitting: Oncology

## 2011-04-28 ENCOUNTER — Encounter (HOSPITAL_BASED_OUTPATIENT_CLINIC_OR_DEPARTMENT_OTHER): Payer: Medicare Other | Admitting: Oncology

## 2011-04-28 DIAGNOSIS — C9110 Chronic lymphocytic leukemia of B-cell type not having achieved remission: Secondary | ICD-10-CM

## 2011-04-28 LAB — CBC WITH DIFFERENTIAL/PLATELET
BASO%: 0.1 % (ref 0.0–2.0)
Basophils Absolute: 0 10*3/uL (ref 0.0–0.1)
EOS%: 0.5 % (ref 0.0–7.0)
Eosinophils Absolute: 0.1 10*3/uL (ref 0.0–0.5)
HCT: 37.8 % (ref 34.8–46.6)
HGB: 13.1 g/dL (ref 11.6–15.9)
LYMPH%: 14.8 % (ref 14.0–49.7)
MCH: 32.9 pg (ref 25.1–34.0)
MCHC: 34.5 g/dL (ref 31.5–36.0)
MCV: 95.2 fL (ref 79.5–101.0)
MONO#: 0.1 10*3/uL (ref 0.1–0.9)
MONO%: 1.2 % (ref 0.0–14.0)
NEUT#: 8.8 10*3/uL — ABNORMAL HIGH (ref 1.5–6.5)
NEUT%: 83.4 % — ABNORMAL HIGH (ref 38.4–76.8)
Platelets: 91 10*3/uL — ABNORMAL LOW (ref 145–400)
RBC: 3.97 10*6/uL (ref 3.70–5.45)
RDW: 15.4 % — ABNORMAL HIGH (ref 11.2–14.5)
WBC: 10.6 10*3/uL — ABNORMAL HIGH (ref 3.9–10.3)
lymph#: 1.6 10*3/uL (ref 0.9–3.3)

## 2011-04-28 LAB — COMPREHENSIVE METABOLIC PANEL
ALT: 9 U/L (ref 0–35)
AST: 14 U/L (ref 0–37)
Albumin: 4.6 g/dL (ref 3.5–5.2)
Alkaline Phosphatase: 114 U/L (ref 39–117)
BUN: 18 mg/dL (ref 6–23)
CO2: 29 mEq/L (ref 19–32)
Calcium: 9.9 mg/dL (ref 8.4–10.5)
Chloride: 102 mEq/L (ref 96–112)
Creatinine, Ser: 1 mg/dL (ref 0.50–1.10)
Glucose, Bld: 83 mg/dL (ref 70–99)
Potassium: 4.2 mEq/L (ref 3.5–5.3)
Sodium: 141 mEq/L (ref 135–145)
Total Bilirubin: 0.5 mg/dL (ref 0.3–1.2)
Total Protein: 6.2 g/dL (ref 6.0–8.3)

## 2011-04-28 LAB — MORPHOLOGY: PLT EST: DECREASED

## 2011-04-28 LAB — LACTATE DEHYDROGENASE: LDH: 142 U/L (ref 94–250)

## 2011-04-28 LAB — URIC ACID: Uric Acid, Serum: 7.6 mg/dL — ABNORMAL HIGH (ref 2.4–7.0)

## 2011-05-10 ENCOUNTER — Other Ambulatory Visit: Payer: Self-pay | Admitting: Oncology

## 2011-05-10 ENCOUNTER — Encounter (HOSPITAL_BASED_OUTPATIENT_CLINIC_OR_DEPARTMENT_OTHER): Payer: Medicare Other | Admitting: Oncology

## 2011-05-10 DIAGNOSIS — C9110 Chronic lymphocytic leukemia of B-cell type not having achieved remission: Secondary | ICD-10-CM

## 2011-05-10 DIAGNOSIS — Z5111 Encounter for antineoplastic chemotherapy: Secondary | ICD-10-CM

## 2011-05-10 LAB — CBC WITH DIFFERENTIAL/PLATELET
BASO%: 0.3 % (ref 0.0–2.0)
Basophils Absolute: 0 10*3/uL (ref 0.0–0.1)
EOS%: 0.5 % (ref 0.0–7.0)
Eosinophils Absolute: 0 10*3/uL (ref 0.0–0.5)
HCT: 34.3 % — ABNORMAL LOW (ref 34.8–46.6)
HGB: 12 g/dL (ref 11.6–15.9)
LYMPH%: 34.8 % (ref 14.0–49.7)
MCH: 31.4 pg (ref 25.1–34.0)
MCHC: 35 g/dL (ref 31.5–36.0)
MCV: 89.8 fL (ref 79.5–101.0)
MONO#: 0.7 10*3/uL (ref 0.1–0.9)
MONO%: 8.7 % (ref 0.0–14.0)
NEUT#: 4.4 10*3/uL (ref 1.5–6.5)
NEUT%: 55.7 % (ref 38.4–76.8)
Platelets: 82 10*3/uL — ABNORMAL LOW (ref 145–400)
RBC: 3.82 10*6/uL (ref 3.70–5.45)
RDW: 14.3 % (ref 11.2–14.5)
WBC: 7.9 10*3/uL (ref 3.9–10.3)
lymph#: 2.8 10*3/uL (ref 0.9–3.3)
nRBC: 0 % (ref 0–0)

## 2011-05-11 ENCOUNTER — Encounter (HOSPITAL_BASED_OUTPATIENT_CLINIC_OR_DEPARTMENT_OTHER): Payer: Medicare Other | Admitting: Oncology

## 2011-05-11 DIAGNOSIS — C9110 Chronic lymphocytic leukemia of B-cell type not having achieved remission: Secondary | ICD-10-CM

## 2011-05-11 DIAGNOSIS — Z5111 Encounter for antineoplastic chemotherapy: Secondary | ICD-10-CM

## 2011-05-12 ENCOUNTER — Encounter (HOSPITAL_BASED_OUTPATIENT_CLINIC_OR_DEPARTMENT_OTHER): Payer: Medicare Other | Admitting: Oncology

## 2011-05-12 DIAGNOSIS — Z5111 Encounter for antineoplastic chemotherapy: Secondary | ICD-10-CM

## 2011-05-12 DIAGNOSIS — C9110 Chronic lymphocytic leukemia of B-cell type not having achieved remission: Secondary | ICD-10-CM

## 2011-05-14 ENCOUNTER — Encounter (HOSPITAL_BASED_OUTPATIENT_CLINIC_OR_DEPARTMENT_OTHER): Payer: Medicare Other | Admitting: Oncology

## 2011-05-14 DIAGNOSIS — IMO0002 Reserved for concepts with insufficient information to code with codable children: Secondary | ICD-10-CM

## 2011-05-14 DIAGNOSIS — C9110 Chronic lymphocytic leukemia of B-cell type not having achieved remission: Secondary | ICD-10-CM

## 2011-06-08 ENCOUNTER — Other Ambulatory Visit: Payer: Self-pay | Admitting: Oncology

## 2011-06-08 ENCOUNTER — Encounter (HOSPITAL_BASED_OUTPATIENT_CLINIC_OR_DEPARTMENT_OTHER): Payer: Medicare Other | Admitting: Oncology

## 2011-06-08 DIAGNOSIS — Z09 Encounter for follow-up examination after completed treatment for conditions other than malignant neoplasm: Secondary | ICD-10-CM

## 2011-06-08 DIAGNOSIS — C9110 Chronic lymphocytic leukemia of B-cell type not having achieved remission: Secondary | ICD-10-CM

## 2011-06-08 DIAGNOSIS — Z5111 Encounter for antineoplastic chemotherapy: Secondary | ICD-10-CM

## 2011-06-08 LAB — CBC WITH DIFFERENTIAL/PLATELET
BASO%: 0.3 % (ref 0.0–2.0)
Basophils Absolute: 0 10*3/uL (ref 0.0–0.1)
EOS%: 0.7 % (ref 0.0–7.0)
Eosinophils Absolute: 0.1 10*3/uL (ref 0.0–0.5)
HCT: 33.4 % — ABNORMAL LOW (ref 34.8–46.6)
HGB: 11.7 g/dL (ref 11.6–15.9)
LYMPH%: 43.8 % (ref 14.0–49.7)
MCH: 33 pg (ref 25.1–34.0)
MCHC: 35.1 g/dL (ref 31.5–36.0)
MCV: 94.2 fL (ref 79.5–101.0)
MONO#: 0.3 10*3/uL (ref 0.1–0.9)
MONO%: 3.8 % (ref 0.0–14.0)
NEUT#: 4.2 10*3/uL (ref 1.5–6.5)
NEUT%: 51.4 % (ref 38.4–76.8)
Platelets: 77 10*3/uL — ABNORMAL LOW (ref 145–400)
RBC: 3.55 10*6/uL — ABNORMAL LOW (ref 3.70–5.45)
RDW: 15.6 % — ABNORMAL HIGH (ref 11.2–14.5)
WBC: 8.2 10*3/uL (ref 3.9–10.3)
lymph#: 3.6 10*3/uL — ABNORMAL HIGH (ref 0.9–3.3)

## 2011-07-14 ENCOUNTER — Ambulatory Visit (HOSPITAL_COMMUNITY)
Admission: RE | Admit: 2011-07-14 | Discharge: 2011-07-14 | Disposition: A | Discharge status: Home / Self Care | Payer: Medicare Other | Source: Ambulatory Visit | Attending: Oncology | Admitting: Oncology

## 2011-07-14 ENCOUNTER — Other Ambulatory Visit: Payer: Self-pay | Admitting: Oncology

## 2011-07-14 ENCOUNTER — Other Ambulatory Visit (HOSPITAL_COMMUNITY)
Admission: RE | Admit: 2011-07-14 | Discharge: 2011-07-14 | Disposition: A | Discharge status: Home / Self Care | Payer: Medicare Other | Source: Ambulatory Visit | Attending: Oncology | Admitting: Oncology

## 2011-07-14 ENCOUNTER — Encounter (HOSPITAL_BASED_OUTPATIENT_CLINIC_OR_DEPARTMENT_OTHER): Payer: Medicare Other | Admitting: Oncology

## 2011-07-14 DIAGNOSIS — R161 Splenomegaly, not elsewhere classified: Secondary | ICD-10-CM | POA: Insufficient documentation

## 2011-07-14 DIAGNOSIS — Z5111 Encounter for antineoplastic chemotherapy: Secondary | ICD-10-CM

## 2011-07-14 DIAGNOSIS — C9110 Chronic lymphocytic leukemia of B-cell type not having achieved remission: Secondary | ICD-10-CM

## 2011-07-14 DIAGNOSIS — C9590 Leukemia, unspecified not having achieved remission: Secondary | ICD-10-CM | POA: Insufficient documentation

## 2011-07-14 DIAGNOSIS — Z09 Encounter for follow-up examination after completed treatment for conditions other than malignant neoplasm: Secondary | ICD-10-CM

## 2011-07-14 DIAGNOSIS — Z9221 Personal history of antineoplastic chemotherapy: Secondary | ICD-10-CM | POA: Insufficient documentation

## 2011-07-14 LAB — COMPREHENSIVE METABOLIC PANEL
ALT: 13 U/L (ref 0–35)
AST: 16 U/L (ref 0–37)
Albumin: 4.4 g/dL (ref 3.5–5.2)
Alkaline Phosphatase: 63 U/L (ref 39–117)
BUN: 22 mg/dL (ref 6–23)
CO2: 28 mEq/L (ref 19–32)
Calcium: 9 mg/dL (ref 8.4–10.5)
Chloride: 102 mEq/L (ref 96–112)
Creatinine, Ser: 1.01 mg/dL (ref 0.50–1.10)
Glucose, Bld: 96 mg/dL (ref 70–99)
Potassium: 4.2 mEq/L (ref 3.5–5.3)
Sodium: 139 mEq/L (ref 135–145)
Total Bilirubin: 0.6 mg/dL (ref 0.3–1.2)
Total Protein: 5.8 g/dL — ABNORMAL LOW (ref 6.0–8.3)

## 2011-07-14 LAB — CBC WITH DIFFERENTIAL/PLATELET
BASO%: 0.2 % (ref 0.0–2.0)
Basophils Absolute: 0 10*3/uL (ref 0.0–0.1)
EOS%: 0.6 % (ref 0.0–7.0)
Eosinophils Absolute: 0 10*3/uL (ref 0.0–0.5)
HCT: 35 % (ref 34.8–46.6)
HGB: 12.2 g/dL (ref 11.6–15.9)
LYMPH%: 68.5 % — ABNORMAL HIGH (ref 14.0–49.7)
MCH: 31.5 pg (ref 25.1–34.0)
MCHC: 34.9 g/dL (ref 31.5–36.0)
MCV: 90.2 fL (ref 79.5–101.0)
MONO#: 0.3 10*3/uL (ref 0.1–0.9)
MONO%: 5.2 % (ref 0.0–14.0)
NEUT#: 1.7 10*3/uL (ref 1.5–6.5)
NEUT%: 25.5 % — ABNORMAL LOW (ref 38.4–76.8)
Platelets: 80 10*3/uL — ABNORMAL LOW (ref 145–400)
RBC: 3.88 10*6/uL (ref 3.70–5.45)
RDW: 14.2 % (ref 11.2–14.5)
WBC: 6.5 10*3/uL (ref 3.9–10.3)
lymph#: 4.5 10*3/uL — ABNORMAL HIGH (ref 0.9–3.3)

## 2011-07-14 LAB — LACTATE DEHYDROGENASE: LDH: 136 U/L (ref 94–250)

## 2011-07-14 LAB — MORPHOLOGY: PLT EST: DECREASED

## 2011-07-14 LAB — FLOW CYTOMETRY

## 2011-07-28 ENCOUNTER — Other Ambulatory Visit: Payer: Self-pay | Admitting: Oncology

## 2011-07-28 ENCOUNTER — Encounter (HOSPITAL_BASED_OUTPATIENT_CLINIC_OR_DEPARTMENT_OTHER): Payer: Medicare Other | Admitting: Oncology

## 2011-07-28 DIAGNOSIS — Z5111 Encounter for antineoplastic chemotherapy: Secondary | ICD-10-CM

## 2011-07-28 DIAGNOSIS — Z23 Encounter for immunization: Secondary | ICD-10-CM

## 2011-07-28 DIAGNOSIS — C9110 Chronic lymphocytic leukemia of B-cell type not having achieved remission: Secondary | ICD-10-CM

## 2011-07-28 LAB — CBC WITH DIFFERENTIAL/PLATELET
BASO%: 0.5 % (ref 0.0–2.0)
Basophils Absolute: 0.1 10*3/uL (ref 0.0–0.1)
EOS%: 0.4 % (ref 0.0–7.0)
Eosinophils Absolute: 0 10*3/uL (ref 0.0–0.5)
HCT: 35.9 % (ref 34.8–46.6)
HGB: 12.5 g/dL (ref 11.6–15.9)
LYMPH%: 74.5 % — ABNORMAL HIGH (ref 14.0–49.7)
MCH: 30 pg (ref 25.1–34.0)
MCHC: 34.8 g/dL (ref 31.5–36.0)
MCV: 86.1 fL (ref 79.5–101.0)
MONO#: 0.7 10*3/uL (ref 0.1–0.9)
MONO%: 6.4 % (ref 0.0–14.0)
NEUT#: 1.9 10*3/uL (ref 1.5–6.5)
NEUT%: 18.2 % — ABNORMAL LOW (ref 38.4–76.8)
Platelets: 79 10*3/uL — ABNORMAL LOW (ref 145–400)
RBC: 4.17 10*6/uL (ref 3.70–5.45)
RDW: 13 % (ref 11.2–14.5)
WBC: 10.5 10*3/uL — ABNORMAL HIGH (ref 3.9–10.3)
lymph#: 7.9 10*3/uL — ABNORMAL HIGH (ref 0.9–3.3)
nRBC: 0 % (ref 0–0)

## 2011-07-28 LAB — TECHNOLOGIST REVIEW

## 2011-09-07 ENCOUNTER — Other Ambulatory Visit: Payer: Self-pay

## 2011-09-07 MED ORDER — ALLOPURINOL 100 MG PO TABS: 100.0000 mg | ORAL_TABLET | Freq: Every day | ORAL | Status: DC

## 2011-09-07 NOTE — Telephone Encounter (Signed)
CALLED [facility] [phone] THE PCP HAS NOT CALLED IN A REFILL FOR ALLOPURINOL. CALLED IN 1 REFILL AS ABOVE PER DR. LIVESAY SO PT. NOT WITH OUT MEDICATION.

## 2011-09-08 ENCOUNTER — Other Ambulatory Visit: Payer: Medicare Other | Admitting: Lab

## 2011-09-14 ENCOUNTER — Telehealth: Payer: Self-pay

## 2011-09-14 ENCOUNTER — Telehealth: Payer: Self-pay | Admitting: Oncology

## 2011-09-14 NOTE — Telephone Encounter (Signed)
lmonvm for sister lela re appt for 11/28 @ 10:30 am. D/t per lela (nurse). Also confirmed appt for 11/01/2011 @ 11:30 am. Lela to call me directly is she has any questions.

## 2011-09-14 NOTE — Telephone Encounter (Signed)
SPOKE WITH MS. Bartholomew Crews AND TOLD HER THAT HER SISTER DOES NEED A F/U LAB APPT. TO CHECK ON COUNTS PER ORDER 07-28-11.  APPT. SCHEDULED FOR 09-15-11 AT 1030.  MELISSA DALE TO SCHEDULE.  MELISSA TO CONTACT MS. Lillia Mountain WITH January APPT. TO SEE DR. Darrold Span.

## 2011-09-15 ENCOUNTER — Other Ambulatory Visit (HOSPITAL_BASED_OUTPATIENT_CLINIC_OR_DEPARTMENT_OTHER): Payer: Medicare Other | Admitting: Lab

## 2011-09-15 ENCOUNTER — Other Ambulatory Visit: Payer: Self-pay | Admitting: Oncology

## 2011-09-15 DIAGNOSIS — C9110 Chronic lymphocytic leukemia of B-cell type not having achieved remission: Secondary | ICD-10-CM

## 2011-09-15 DIAGNOSIS — Z5111 Encounter for antineoplastic chemotherapy: Secondary | ICD-10-CM

## 2011-09-15 DIAGNOSIS — IMO0002 Reserved for concepts with insufficient information to code with codable children: Secondary | ICD-10-CM

## 2011-09-15 LAB — CBC WITH DIFFERENTIAL/PLATELET
BASO%: 0.2 % (ref 0.0–2.0)
Basophils Absolute: 0.1 10*3/uL (ref 0.0–0.1)
EOS%: 0.5 % (ref 0.0–7.0)
Eosinophils Absolute: 0.1 10*3/uL (ref 0.0–0.5)
HCT: 34.2 % — ABNORMAL LOW (ref 34.8–46.6)
HGB: 11.7 g/dL (ref 11.6–15.9)
LYMPH%: 88.5 % — ABNORMAL HIGH (ref 14.0–49.7)
MCH: 29.7 pg (ref 25.1–34.0)
MCHC: 34.1 g/dL (ref 31.5–36.0)
MCV: 87.1 fL (ref 79.5–101.0)
MONO#: 1 10*3/uL — ABNORMAL HIGH (ref 0.1–0.9)
MONO%: 3.9 % (ref 0.0–14.0)
NEUT#: 1.7 10*3/uL (ref 1.5–6.5)
NEUT%: 6.9 % — ABNORMAL LOW (ref 38.4–76.8)
Platelets: 76 10*3/uL — ABNORMAL LOW (ref 145–400)
RBC: 3.93 10*6/uL (ref 3.70–5.45)
RDW: 14.3 % (ref 11.2–14.5)
WBC: 25.3 10*3/uL — ABNORMAL HIGH (ref 3.9–10.3)
lymph#: 22.4 10*3/uL — ABNORMAL HIGH (ref 0.9–3.3)

## 2011-09-15 LAB — MORPHOLOGY: PLT EST: DECREASED

## 2011-09-24 ENCOUNTER — Other Ambulatory Visit: Payer: Self-pay

## 2011-09-24 MED ORDER — ALLOPURINOL 100 MG PO TABS: 100.0000 mg | ORAL_TABLET | Freq: Every day | ORAL | Status: DC

## 2011-11-01 ENCOUNTER — Other Ambulatory Visit: Payer: Self-pay | Admitting: Oncology

## 2011-11-01 ENCOUNTER — Telehealth: Payer: Self-pay | Admitting: Oncology

## 2011-11-01 ENCOUNTER — Other Ambulatory Visit (HOSPITAL_BASED_OUTPATIENT_CLINIC_OR_DEPARTMENT_OTHER): Payer: Medicare Other | Admitting: Lab

## 2011-11-01 ENCOUNTER — Ambulatory Visit (HOSPITAL_BASED_OUTPATIENT_CLINIC_OR_DEPARTMENT_OTHER): Payer: Medicare Other | Admitting: Oncology

## 2011-11-01 VITALS — BP 120/70 | HR 79 | Temp 97.7°F | Wt 225.6 lb

## 2011-11-01 DIAGNOSIS — Z5111 Encounter for antineoplastic chemotherapy: Secondary | ICD-10-CM

## 2011-11-01 DIAGNOSIS — IMO0002 Reserved for concepts with insufficient information to code with codable children: Secondary | ICD-10-CM

## 2011-11-01 DIAGNOSIS — C9110 Chronic lymphocytic leukemia of B-cell type not having achieved remission: Secondary | ICD-10-CM

## 2011-11-01 LAB — COMPREHENSIVE METABOLIC PANEL
ALT: 10 U/L (ref 0–35)
AST: 22 U/L (ref 0–37)
Albumin: 4.3 g/dL (ref 3.5–5.2)
Alkaline Phosphatase: 70 U/L (ref 39–117)
BUN: 22 mg/dL (ref 6–23)
CO2: 32 mEq/L (ref 19–32)
Calcium: 9 mg/dL (ref 8.4–10.5)
Chloride: 102 mEq/L (ref 96–112)
Creatinine, Ser: 0.92 mg/dL (ref 0.50–1.10)
Glucose, Bld: 101 mg/dL — ABNORMAL HIGH (ref 70–99)
Potassium: 3.9 mEq/L (ref 3.5–5.3)
Sodium: 140 mEq/L (ref 135–145)
Total Bilirubin: 0.5 mg/dL (ref 0.3–1.2)
Total Protein: 5.8 g/dL — ABNORMAL LOW (ref 6.0–8.3)

## 2011-11-01 LAB — CBC WITH DIFFERENTIAL/PLATELET
BASO%: 0.4 % (ref 0.0–2.0)
Basophils Absolute: 0.2 10*3/uL — ABNORMAL HIGH (ref 0.0–0.1)
EOS%: 0.1 % (ref 0.0–7.0)
Eosinophils Absolute: 0.1 10*3/uL (ref 0.0–0.5)
HCT: 33.4 % — ABNORMAL LOW (ref 34.8–46.6)
HGB: 11.4 g/dL — ABNORMAL LOW (ref 11.6–15.9)
LYMPH%: 92.1 % — ABNORMAL HIGH (ref 14.0–49.7)
MCH: 29.8 pg (ref 25.1–34.0)
MCHC: 34.2 g/dL (ref 31.5–36.0)
MCV: 87.3 fL (ref 79.5–101.0)
MONO#: 1.2 10*3/uL — ABNORMAL HIGH (ref 0.1–0.9)
MONO%: 2.1 % (ref 0.0–14.0)
NEUT#: 2.9 10*3/uL (ref 1.5–6.5)
NEUT%: 5.3 % — ABNORMAL LOW (ref 38.4–76.8)
Platelets: 75 10*3/uL — ABNORMAL LOW (ref 145–400)
RBC: 3.83 10*6/uL (ref 3.70–5.45)
RDW: 16.2 % — ABNORMAL HIGH (ref 11.2–14.5)
WBC: 55.1 10*3/uL (ref 3.9–10.3)
lymph#: 50.7 10*3/uL — ABNORMAL HIGH (ref 0.9–3.3)

## 2011-11-01 LAB — LACTATE DEHYDROGENASE: LDH: 191 U/L (ref 94–250)

## 2011-11-01 LAB — MORPHOLOGY: PLT EST: DECREASED

## 2011-11-01 NOTE — Telephone Encounter (Signed)
At patient's request, I spoke with sister Bartholomew Crews, who is her HCPOA,  By phone following visit today. [facility] is [facility] as I have [facility]. Sister prefers [facility] if possible; she will be traveling frequently over next couple of months and will LM with my RN re dates that would be best for scheduling.

## 2011-11-02 ENCOUNTER — Telehealth: Payer: Self-pay

## 2011-11-02 ENCOUNTER — Telehealth: Payer: Self-pay | Admitting: Oncology

## 2011-11-02 NOTE — Telephone Encounter (Signed)
SPOKE WITH SIS [facility] WILIKINS AND TOLD HER THAT MS, Erin Moore WAS SCHEDULED WITH DR. Verlon Au ELLIS AT [facility] ON December 27, 2011 AT 0900.  THAT OFFICE WILL BE IN TOUCH WITH HER WITH MORE INFORMATION  AND DIRECTIONS. SISTER VERBALIZED UNDERSTANDING.

## 2011-11-02 NOTE — Telephone Encounter (Signed)
Called back to [facility] hematology clinic new patient scheduler @ [phone] re appt with Dr.Leslie Rennis Harding, LM.  Note sister prefers appt on any of the following dates: Jan 28 - 31; Feb 11,12,13,15; Feb 18 - 22; or week of March 11 excluding March 15.

## 2011-11-04 ENCOUNTER — Other Ambulatory Visit: Payer: Self-pay | Admitting: Oncology

## 2011-11-04 DIAGNOSIS — C9110 Chronic lymphocytic leukemia of B-cell type not having achieved remission: Secondary | ICD-10-CM

## 2011-11-04 NOTE — Progress Notes (Signed)
OFFICE PROGRESS NOTE Date of Visit 11-01-2011 Physicians: S.Wolters  INTERVAL HISTORY:   Patient is seen, alone for visit today, in continuing attention to her CLL. This is the first time that her sister, who is HCPOA, has not accompanied, as I believe they thought this was lab only visit today. As patient and sister have previously requested, I have not had detailed conversation with patient  alone today; she understands that I will speak with sister by phone following visit. History is of CLL diagnosed fall 2011 after she presented to primary MD with transient fatigue and had WBC ~ 13k. She had cervical lymph node biopsy in Sept 2011 and bone marrow exam Nov 2011 with cytogenetics done at [facility].  She did not have high risk cytogenetics. She did, however, have rapid doubling time and platelets decreaed to < 100k.  She and sister were not interested in consideration of transplant then.  She was treated with fludara/Rituxan x 6 cycles from Feb 2012 thru July 2012, which she tolerated generally well. Despite regular review of medications, we learned after treatment completed that she had not taken prophylactic TMP/SMX as instructed. She had partial response to the fludara/rituxan by labs and repeat US to evaluate spleen size. We have followed since July 2012 with observation. WBC was some higher by labs in Nov .and platelets have not improved since treatment was completed. Darel Hong tells me that she has been feeling very well, with the best energy that she has had in a long time. She is going to the gym and going about all regular activities. She denies any fever or symptoms of infection, any pain, any abdominal discomfort, any bleeding although she bruises easily. She did have flu vaccine in Oct, washes hands frequently.  Review of Systems otherwise: no shortness of breath, cough or chest pain. Good appetite. Bowels fine. No bladder symptoms. Sleeps well. No swelling in legs. Tolerating antidepressant from  primary MD well. Remainder of 10 point ROS negative.  Objective:  Vital signs in last 24 hours:  BP 120/70  Pulse 79  Temp(Src) 97.7 F (36.5 C) (Oral)  Wt 225 lb 9.6 oz (102.331 kg)    HEENT:mucous membranes moist, pharynx normal without lesions PERRL. Neck supple. LymphaticsCervical, supraclavicular, and axillary nodes normal.No appreciable inguinal adenopathy. Resp: clear to auscultation bilaterally and normal percussion bilaterally Cardio: regular rate and rhythm GI: obese, soft, nontender, cannot palpate liver or spleen. Normal bowel sounds. Extremities: extremities normal, atraumatic, no cyanosis or edema Skin: scattered ecchymoses up to 2 cm right lateral abdomen and left forearm. No petechiae, no rash. Neuro/Psych:no focal deficits. Speech fluent, affect appropriate Breasts bilaterally without dominant mass or other findings of concern. No central catheter.  Lab Results:   [facility] 11/01/11 1127  WBC 55.1*  HGB 11.4*  HCT 33.4*  PLT 75*  ANC 2.9    % lymphs 92 Smudge cells and some variant lymphs seen  CBC 09-15-11 WBC 25  ANC 1.7,   Hgb 11.7    plt 76k CBC 07-28-11  WBC 10.5  ANC 1.9   Hgb 12.5  plt 79K CBC 06-08-11   WBC 8.2  ANC 4.2   Hgb 11.7   plt 77K  BMET/CMET  Basename 11/01/11 1127  NA 140  K 3.9  CL 102  CO2 32  GLUCOSE 101*  BUN 22  CREATININE 0.92  CALCIUM 9.0  remainder of full CMET WNL except Tprot 5.8 LDH 191, this having been 136 in Sept.  Studies/Results:  No results found.  Medications: I have reviewed the patient's current medications.  Assessment/Plan:  1. CLL:  History as above, off treatment since July. WBC increasing, mild thrombocytopenia stable without overt bleeding, hgb stable, no recent infections. I have told Darel Hong that it might be helpful to have a second opinion consultation with a physician who specializes in CLL; she defers to her sister. See phone documentation, sister Bartholomew Crews [facility] in agreement but is  traveling quite a bit over next couple of months and appointment will need to be coordinated with her schedule. 2. Mental disability: sister makes health care decisions, tho patient is able to live independently, to drive etc.  3. Anxiety, depression followed by primary. Doing well on present meds. 4.Obesity: not following diet recently   Return appointments to be set up depending on time of second opinion consultation.  > 50% of this visit spent in coordination of care.       LIVESAY,LENNIS P, MD   11/04/2011, 9:26 AM

## 2011-11-05 ENCOUNTER — Telehealth: Payer: Self-pay | Admitting: Oncology

## 2011-11-05 ENCOUNTER — Other Ambulatory Visit: Payer: Self-pay | Admitting: Oncology

## 2011-11-05 NOTE — Telephone Encounter (Signed)
Spoke with [facility] pathology and confirmed that path from bone marrow and LN bx already sent to [facility]

## 2011-11-12 ENCOUNTER — Telehealth: Payer: Self-pay

## 2011-11-12 ENCOUNTER — Other Ambulatory Visit: Payer: Self-pay | Admitting: Oncology

## 2011-11-12 NOTE — Telephone Encounter (Signed)
LM FOR MS. WILKINS STATING THAT MS. Mincer DOES NOT NEED TO KEEP LAB APPT. SET FOR 11-15-11.  IT WAS ACTUALLY  CANCELLED.  MS. Righter HAS A LAB AND VISIT APPT. WITH DR. Darrold Span 12-07-11 AT 1100. MS. WILKINS  CAN CALL AND SPEAK WITH DR. Precious Reel NURSE IF SHE HAS ANY QUESTIONS.

## 2011-11-15 ENCOUNTER — Other Ambulatory Visit: Payer: Self-pay | Admitting: *Deleted

## 2011-11-15 DIAGNOSIS — D7289 Other specified disorders of white blood cells: Secondary | ICD-10-CM

## 2011-11-15 MED ORDER — ALLOPURINOL 100 MG PO TABS: 100.0000 mg | ORAL_TABLET | Freq: Every day | ORAL | Status: DC

## 2011-11-16 ENCOUNTER — Telehealth: Payer: Self-pay

## 2011-11-16 NOTE — Telephone Encounter (Signed)
MAILED A COPY OF [name] LABS FROM 11-01-11 AS REQUESTED BY POA SISTER LEAH WILKINS. ADDRESS: [id number] [place], [id number].

## 2011-11-24 ENCOUNTER — Ambulatory Visit
Admission: RE | Admit: 2011-11-24 | Discharge: 2011-11-24 | Disposition: A | Discharge status: Home / Self Care | Payer: Medicare Other | Source: Ambulatory Visit | Attending: Family Medicine | Admitting: Family Medicine

## 2011-11-24 ENCOUNTER — Other Ambulatory Visit: Payer: Self-pay | Admitting: Family Medicine

## 2011-11-24 DIAGNOSIS — J189 Pneumonia, unspecified organism: Secondary | ICD-10-CM

## 2011-12-07 ENCOUNTER — Telehealth: Payer: Self-pay | Admitting: Oncology

## 2011-12-07 ENCOUNTER — Ambulatory Visit (HOSPITAL_COMMUNITY)
Admission: RE | Admit: 2011-12-07 | Discharge: 2011-12-07 | Disposition: A | Discharge status: Home / Self Care | Payer: Medicare Other | Source: Ambulatory Visit | Attending: Oncology | Admitting: Oncology

## 2011-12-07 ENCOUNTER — Other Ambulatory Visit: Payer: Medicare Other

## 2011-12-07 ENCOUNTER — Encounter: Payer: Self-pay | Admitting: Oncology

## 2011-12-07 ENCOUNTER — Ambulatory Visit (HOSPITAL_BASED_OUTPATIENT_CLINIC_OR_DEPARTMENT_OTHER): Payer: Medicare Other | Admitting: Oncology

## 2011-12-07 VITALS — BP 128/66 | HR 68 | Temp 97.2°F | Ht 62.0 in | Wt 221.4 lb

## 2011-12-07 DIAGNOSIS — Z856 Personal history of leukemia: Secondary | ICD-10-CM | POA: Insufficient documentation

## 2011-12-07 DIAGNOSIS — C9110 Chronic lymphocytic leukemia of B-cell type not having achieved remission: Secondary | ICD-10-CM

## 2011-12-07 DIAGNOSIS — J189 Pneumonia, unspecified organism: Secondary | ICD-10-CM | POA: Insufficient documentation

## 2011-12-07 DIAGNOSIS — C9190 Lymphoid leukemia, unspecified not having achieved remission: Secondary | ICD-10-CM

## 2011-12-07 LAB — CBC WITH DIFFERENTIAL/PLATELET
BASO%: 0 % (ref 0.0–2.0)
Basophils Absolute: 0 10*3/uL (ref 0.0–0.1)
EOS%: 0.4 % (ref 0.0–7.0)
Eosinophils Absolute: 0.4 10*3/uL (ref 0.0–0.5)
HCT: 34.8 % (ref 34.8–46.6)
HGB: 11.4 g/dL — ABNORMAL LOW (ref 11.6–15.9)
LYMPH%: 75.8 % — ABNORMAL HIGH (ref 14.0–49.7)
MCH: 29.2 pg (ref 25.1–34.0)
MCHC: 32.7 g/dL (ref 31.5–36.0)
MCV: 89.4 fL (ref 79.5–101.0)
MONO#: 1.5 10*3/uL — ABNORMAL HIGH (ref 0.1–0.9)
MONO%: 1.4 % (ref 0.0–14.0)
NEUT#: 24.4 10*3/uL — ABNORMAL HIGH (ref 1.5–6.5)
NEUT%: 22.4 % — ABNORMAL LOW (ref 38.4–76.8)
Platelets: 84 10*3/uL — ABNORMAL LOW (ref 145–400)
RBC: 3.89 10*6/uL (ref 3.70–5.45)
RDW: 17.3 % — ABNORMAL HIGH (ref 11.2–14.5)
WBC: 108.9 10*3/uL (ref 3.9–10.3)
lymph#: 82.5 10*3/uL — ABNORMAL HIGH (ref 0.9–3.3)

## 2011-12-07 LAB — COMPREHENSIVE METABOLIC PANEL
ALT: 14 U/L (ref 0–35)
AST: 27 U/L (ref 0–37)
Albumin: 4.3 g/dL (ref 3.5–5.2)
Alkaline Phosphatase: 77 U/L (ref 39–117)
BUN: 19 mg/dL (ref 6–23)
CO2: 29 mEq/L (ref 19–32)
Calcium: 9.6 mg/dL (ref 8.4–10.5)
Chloride: 102 mEq/L (ref 96–112)
Creatinine, Ser: 0.98 mg/dL (ref 0.50–1.10)
Glucose, Bld: 94 mg/dL (ref 70–99)
Potassium: 4.1 mEq/L (ref 3.5–5.3)
Sodium: 140 mEq/L (ref 135–145)
Total Bilirubin: 0.4 mg/dL (ref 0.3–1.2)
Total Protein: 5.7 g/dL — ABNORMAL LOW (ref 6.0–8.3)

## 2011-12-07 LAB — MORPHOLOGY
Other Comments: 2
PLT EST: DECREASED

## 2011-12-07 LAB — LACTATE DEHYDROGENASE: LDH: 155 U/L (ref 94–250)

## 2011-12-07 NOTE — Telephone Encounter (Signed)
gv pts sister appt for 02/25

## 2011-12-07 NOTE — Telephone Encounter (Signed)
LM for HCPOA sister Bartholomew Crews to call back to office re today's CXR and that I have called antibiotic in to [facility] [place]. Left note for RN in case she speaks to sister, to let her know the pneumonia is almost gone and we will do 5 more days of the same azithromycin.

## 2011-12-07 NOTE — Telephone Encounter (Signed)
Called in to [facility] pharmacy on [address] Azithromycin 250 mg daily x 5  # 5 no refills.

## 2011-12-07 NOTE — Progress Notes (Signed)
OFFICE PROGRESS NOTE Date of Visit 12-07-2011 Physicians: S.Paulino Rily, Wil[facility] upcoming)  INTERVAL HISTORY:   Patient is seen, together with sister who is HCPOA, in continuing attention to her CLL, this scheduled as interim check as she is awaiting consultation visit with Velta Addison at [facility] on 12-27-2011.  History is of CLL diagnosed fall 2011 after she presented to primary MD with transient fatigue and had WBC ~ 13k. She had cervical lymph node biopsy in Sept 2011 and bone marrow exam Nov 2011 with cytogenetics done at [facility]. She did not have high risk cytogenetics. She did, however, have rapid doubling time and platelets decreaed to < 100k. She and sister were not interested in consideration of transplant then. She was treated with fludara/Rituxan x 6 cycles from Feb 2012 thru July 2012, which she tolerated generally well. Despite regular review of medications, we learned after treatment completed that she had not taken prophylactic TMP/SMX as instructed. She had partial response to the fludara/rituxan by labs and repeat US to evaluate spleen size. We have followed since July 2012 with observation. WBC was some higher by labs in Nov .and platelets have not improved since treatment was completed. When I had seen her last 11-01-2011, she was feeling entirely well, however WBC had increased to 55K from 25K in 08-2011 and 10.5 in 07-2011. Patient and sister tell me that she has been ill with respiratory symptoms for 2 weeks, which began with cough and sputum production. She was seen by primary care on  11-23-2010, with CXR in [facility] system that day showing RML infiltrate. She was treated with 5 days of azithromycin and prednisone 40 mg bid x 5 days, both filled 11-23-2010 and completed 11-27-2010. The cough is much improved and no longer productive, however she is still very fatigued and still short of breath with exertion. She has not had fever, aches or GI symptoms.  Review of Systems  otherwise: no HA, no bleeding, no chest pain, appetite ok, no new or different pain, no skin rash.   Objective:  Vital signs in last 24 hours:  BP 128/66  Pulse 68  Temp(Src) 97.2 F (36.2 C) (Oral)  Ht 5\' 2"  (1.575 m)  Wt 221 lb 6.4 oz (100.426 kg)  BMI 40.49 kg/m2 Alert, looks mildly ill, occasional cough not obviously productive, slightly diaphoretic. O2 saturation at rest 98% and with walking 93%.  HEENT:mucous membranes moist, pharynx normal without lesions. PERRL, not icteric. No JVD. LymphaticsCervical, supraclavicular, and axillary nodes normal. and no other clear adenopathy Resp: normal percussion bilaterally. Few scattered crackles posteriorly, no wheeze, no use of accessory muscles. Cardio: regular rate and rhythm GI: soft, non-tender; bowel sounds normal; no masses,  no organomegaly. Obese. Extremities: extremities normal, atraumatic, no cyanosis or edema Skin without rash or petechiae.   Lab Results:   Basename 12/07/11 1100  WBC 108.9*  HGB 11.4*  HCT 34.8  PLT 84*  ANC 24.2, 75% lymphs including variant lymphs/ some with nucleoli  BMET  Basename 12/07/11 1100  NA 140  K 4.1  CL 102  CO2 29  GLUCOSE 94  BUN 19  CREATININE 0.98  CALCIUM 9.6  remainder of full CMET with Tprot 5.7, also 4.3, LFTs ok, LDH 155, this having been 191 in Nov  Studies/Results: Patient was sent for CXR from visit. There was delay in final report such that patient and sister preferred to leave office and be contacted by phone later in day. Dg Chest 2 View  12/07/2011  *RADIOLOGY REPORT*  Clinical Data: 66 year old female with shortness of breath, coughing.  Pneumonia x2 weeks.  History of CLL.  CHEST - 2 VIEW  Comparison: 11/24/2011 and earlier.  Findings: Interval regression of right middle lobe airspace disease.  Subtle residual perihilar opacity on the right.  Lung parenchyma elsewhere is stable and clear.  No pneumothorax, edema or effusion.  Stable cardiac size and  mediastinal contours. Visualized tracheal air column is within normal limits.  No acute osseous abnormality identified.  IMPRESSION: Regression and near resolution of right middle lobe dominant pneumonia.  Mild residual right perihilar opacity.  Original Report Authenticated By: Harley Hallmark, M.D.    Medications: I have reviewed the patient's current medications. After CXR report available, have called in more azithromycin to her pharmacy.  Assessment/Plan:  1. RML pneumonia: improved with azithromycin, which will be continued for additional 5 days.  2.CLL: WBC much more elevated today, likely with respiratory infection + recent steroids in addition to underlying CLL. Will see her back early next week or sooner if more problems. She is scheduled for consultation at [facility] March 11 3.Cognitive dysfunction: sister is HCPOA. Patient is fairly independent but not fully so.        Jahliyah Trice P, MD   12/07/2011, 8:20 PM

## 2011-12-08 ENCOUNTER — Telehealth: Payer: Self-pay | Admitting: *Deleted

## 2011-12-08 ENCOUNTER — Other Ambulatory Visit: Payer: Self-pay | Admitting: *Deleted

## 2011-12-08 DIAGNOSIS — J189 Pneumonia, unspecified organism: Secondary | ICD-10-CM

## 2011-12-08 MED ORDER — AZITHROMYCIN 250 MG PO TABS: 250.0000 mg | ORAL_TABLET | Freq: Every day | ORAL | Status: DC

## 2011-12-08 NOTE — Telephone Encounter (Signed)
Received vm call from sister, Bartholomew Crews stating that she was told to call back for result of x-ray done yest.  Returned call & reported to [facility] that Dr. Darrold Span reports that pneumonia almost gone but wants pt to cont. ATB/azithromax 5 more days.  She has already picked up script & understands instructions.

## 2011-12-13 ENCOUNTER — Encounter: Payer: Self-pay | Admitting: Oncology

## 2011-12-13 ENCOUNTER — Telehealth: Payer: Self-pay | Admitting: Oncology

## 2011-12-13 ENCOUNTER — Other Ambulatory Visit (HOSPITAL_BASED_OUTPATIENT_CLINIC_OR_DEPARTMENT_OTHER): Payer: Medicare Other | Admitting: Lab

## 2011-12-13 ENCOUNTER — Telehealth: Payer: Self-pay

## 2011-12-13 ENCOUNTER — Ambulatory Visit (HOSPITAL_BASED_OUTPATIENT_CLINIC_OR_DEPARTMENT_OTHER): Payer: Medicare Other | Admitting: Oncology

## 2011-12-13 VITALS — BP 109/62 | HR 74 | Temp 98.3°F | Wt 221.5 lb

## 2011-12-13 DIAGNOSIS — E669 Obesity, unspecified: Secondary | ICD-10-CM

## 2011-12-13 DIAGNOSIS — C9110 Chronic lymphocytic leukemia of B-cell type not having achieved remission: Secondary | ICD-10-CM

## 2011-12-13 DIAGNOSIS — C9190 Lymphoid leukemia, unspecified not having achieved remission: Secondary | ICD-10-CM

## 2011-12-13 LAB — COMPREHENSIVE METABOLIC PANEL
ALT: 11 U/L (ref 0–35)
AST: 21 U/L (ref 0–37)
Albumin: 4.3 g/dL (ref 3.5–5.2)
Alkaline Phosphatase: 66 U/L (ref 39–117)
BUN: 29 mg/dL — ABNORMAL HIGH (ref 6–23)
CO2: 32 mEq/L (ref 19–32)
Calcium: 9.7 mg/dL (ref 8.4–10.5)
Chloride: 99 mEq/L (ref 96–112)
Creatinine, Ser: 1.09 mg/dL (ref 0.50–1.10)
Glucose, Bld: 84 mg/dL (ref 70–99)
Potassium: 4.3 mEq/L (ref 3.5–5.3)
Sodium: 139 mEq/L (ref 135–145)
Total Bilirubin: 0.4 mg/dL (ref 0.3–1.2)
Total Protein: 5.6 g/dL — ABNORMAL LOW (ref 6.0–8.3)

## 2011-12-13 LAB — CBC WITH DIFFERENTIAL/PLATELET
BASO%: 0 % (ref 0.0–2.0)
Basophils Absolute: 0 10*3/uL (ref 0.0–0.1)
EOS%: 0.1 % (ref 0.0–7.0)
Eosinophils Absolute: 0.1 10*3/uL (ref 0.0–0.5)
HCT: 34.2 % — ABNORMAL LOW (ref 34.8–46.6)
HGB: 11.1 g/dL — ABNORMAL LOW (ref 11.6–15.9)
LYMPH%: 92.7 % — ABNORMAL HIGH (ref 14.0–49.7)
MCH: 29.1 pg (ref 25.1–34.0)
MCHC: 32.5 g/dL (ref 31.5–36.0)
MCV: 89.7 fL (ref 79.5–101.0)
MONO#: 0.6 10*3/uL (ref 0.1–0.9)
MONO%: 0.6 % (ref 0.0–14.0)
NEUT#: 6.5 10*3/uL (ref 1.5–6.5)
NEUT%: 6.6 % — ABNORMAL LOW (ref 38.4–76.8)
Platelets: 81 10*3/uL — ABNORMAL LOW (ref 145–400)
RBC: 3.81 10*6/uL (ref 3.70–5.45)
RDW: 17.2 % — ABNORMAL HIGH (ref 11.2–14.5)
WBC: 99.4 10*3/uL (ref 3.9–10.3)
lymph#: 92.1 10*3/uL — ABNORMAL HIGH (ref 0.9–3.3)

## 2011-12-13 LAB — TECHNOLOGIST REVIEW

## 2011-12-13 NOTE — Telephone Encounter (Signed)
Tried to call sister 31, no answer.

## 2011-12-13 NOTE — Progress Notes (Signed)
OFFICE PROGRESS NOTE Date of Visit: 12-13-2011 Physicians: S.Wolters  INTERVAL HISTORY:  Patient is seen, together with caregiver, in follow up of progressive CLL and recent respiratory infection. Her sister, who is HCPOA, is leaving the country tomorrow and was not able to accompany today. History is of CLL diagnosed fall 2011 after she presented to primary MD with transient fatigue and had WBC ~ 13k. She had cervical lymph node biopsy in Sept 2011 and bone marrow exam Nov 2011 with cytogenetics done at [facility]. She did not have high risk cytogenetics. She did, however, have rapid doubling time and platelets decreaed to < 100k. She and sister were not interested in consideration of transplant then. She was treated with fludara/Rituxan x 6 cycles from Feb 2012 thru July 2012, which she tolerated generally well. Despite regular review of medications, we learned after treatment completed that she had not taken prophylactic TMP/SMX as instructed. She had partial response to the fludara/rituxan by labs and repeat US to evaluate spleen size. We have followed since July 2012 with observation. WBC has been increasing since November and platelets have not improved since treatment was completed. She is scheduled for consultation with Velta Addison at [facility] 12-27-11, this referral made in January but date chosen to allow sister to accompany.  When I saw patient last week, she was gradually improving from a lower respiratory infection that had been ongoing for 2 weeks. Repeat CXR showed improvement in RML pneumonia since treatment with azithromycin and steroids by primary MD. She has now had a second course of azithromycin. Patient and caregiver report that she is further improved, with no fever and cough resolved. She is less short of breath with exertion, still fatigued, still diaphoretic at times, no chest pain. This is the first significant infection of which I am aware since the CLL diagnosis.  Review of  Systems otherwise: good appetite, no nausea, vomiting, diarrhea. No pain. No bleeding or bruising. Able to sleep. No HA or other neurologic symptoms. Remainder of 10 point Review of Systems negative.  Objective:  Vital signs in last 24 hours:  BP 109/62  Pulse 74  Temp(Src) 98.3 F (36.8 C) (Oral)  Wt 221 lb 8 oz (100.472 kg) This weight is stable from last week. Respirations not labored RA. She is ambulatory full length of office without difficulty today, looks somewhat tired and is slightly diaphoretic, but overall improved compared with last week.    HEENT:mucous membranes moist, pharynx normal without lesions. PERRL, not icteric. Neck supple without JVD. LymphaticsCervical, supraclavicular, and axillary nodes normal. and CORRECTION: 2.5 cm soft left axillary node. No clear cervical or supraclavicular adenopathy. Inguinal regions difficult to examine due to obesity.  Resp: clear to auscultation bilaterally and normal percussion bilaterally,no use of accessory muscles. Cardio: regular rate and rhythm GI: obese, nothing palpable, bowel sounds present and normally active, nontender including epigastrium, not distended compared with baseline. Extremities: extremities normal, atraumatic, no cyanosis or edema Neuro:CN, motor, sensory no focal deficits. Speech fluent. Seems to follow conversation adequately.    Lab Results:   Basename 12/13/11 1353  WBC 99.4*  HGB 11.1*  HCT 34.2*  PLT 81*  ANC 6.5, lymphs 92% with some variant lymphs and many smudge cells.  MCV 89.7  BMET  Basename 12/13/11 1353  NA 139  K 4.3  CL 99  CO2 32  GLUCOSE 84  BUN 29*  CREATININE 1.09  CALCIUM 9.7   Remainder of full CMET normal except Tprot 5.6 Studies/Results: Last CT AP was  Nov 2011; also had abd [place] Sept 2012 and recent CXRs. I have recommended repeating CT prior to Dr.Ellis' visit, which patient and caregiver feel they can manage without sister here. Medications: I have reviewed the  patient's current medications.  Assessment/Plan:  1.CLL: history as above, with only partial response to fludara/rituxan and progressive increase in WBC with stable mild thrombocytopenia and anemia; WBC elevation appears in part from recent respiratory infection. I have told Erin Moore today that I expect she will need to resume some treatment after she is seen at [facility]. 2.respiratory infection: radiographically resolving RML pneumonia. Symptoms further improved today 3.morbid obesity 4. Some cognitive disability, tho she lived independently (with daughter) until caregiver involved with recent illness and sister away.  Note sister is very supportive and protective, including during physician visit discussions.  I will see her at any time if needed prior to the [facility] visit, and otherwise will see again on March 19 with labs.       Erin Moore P, MD   12/13/2011, 7:08 PM

## 2011-12-13 NOTE — Telephone Encounter (Signed)
E-MAIL ADDRESS FOR SISTER LEAH IS [email or web address]   SHE IS GOING OUT OF THE COUNTRY 12-14-11.

## 2011-12-13 NOTE — Telephone Encounter (Signed)
Gv pt appt for march2013.  scheduled ct scan for 02/27 @ [facility]

## 2011-12-15 ENCOUNTER — Ambulatory Visit (HOSPITAL_COMMUNITY)
Admission: RE | Admit: 2011-12-15 | Discharge: 2011-12-15 | Disposition: A | Discharge status: Home / Self Care | Payer: Medicare Other | Source: Ambulatory Visit | Attending: Oncology | Admitting: Oncology

## 2011-12-15 DIAGNOSIS — C9190 Lymphoid leukemia, unspecified not having achieved remission: Secondary | ICD-10-CM

## 2011-12-15 DIAGNOSIS — N289 Disorder of kidney and ureter, unspecified: Secondary | ICD-10-CM | POA: Insufficient documentation

## 2011-12-15 DIAGNOSIS — R160 Hepatomegaly, not elsewhere classified: Secondary | ICD-10-CM | POA: Insufficient documentation

## 2011-12-15 DIAGNOSIS — J90 Pleural effusion, not elsewhere classified: Secondary | ICD-10-CM | POA: Insufficient documentation

## 2011-12-15 DIAGNOSIS — I2510 Atherosclerotic heart disease of native coronary artery without angina pectoris: Secondary | ICD-10-CM | POA: Insufficient documentation

## 2011-12-15 DIAGNOSIS — R161 Splenomegaly, not elsewhere classified: Secondary | ICD-10-CM | POA: Insufficient documentation

## 2011-12-15 DIAGNOSIS — R599 Enlarged lymph nodes, unspecified: Secondary | ICD-10-CM | POA: Insufficient documentation

## 2011-12-15 DIAGNOSIS — Z9089 Acquired absence of other organs: Secondary | ICD-10-CM | POA: Insufficient documentation

## 2011-12-15 DIAGNOSIS — C9110 Chronic lymphocytic leukemia of B-cell type not having achieved remission: Secondary | ICD-10-CM | POA: Insufficient documentation

## 2011-12-15 MED ORDER — IOHEXOL 300 MG/ML  SOLN
100.0000 mL | Freq: Once | INTRAMUSCULAR | Status: AC | PRN
  Administered 2011-12-15: 100 mL via INTRAVENOUS

## 2011-12-16 ENCOUNTER — Telehealth: Payer: Self-pay

## 2011-12-16 NOTE — Telephone Encounter (Signed)
FAXED RECENT RECORDS TO DR. ELLIS FOR NEW PT. APPT. THERE 12-27-11.  THEY DO HAVE ORIGINAL RECORDS SENT THERE UP TO 11-04-11. CT REPORT FROM 12-15-11 WAS ALSO FAXED.  VANDA IN [facility] WILL MAIL THE CT SCAN DISC FROM 12-15-11 AND PREVIOUS SCAN IN FALL 2012.  PT.'S PHONE IS SISTERS HOME PHONE AND SHE IS OUT OF THE COUNTRY PRESENTLY SO MAILING THE DISC TO [facility] WOULD BE PRUDENT IN THIS SITUATION.

## 2011-12-31 ENCOUNTER — Telehealth: Payer: Self-pay

## 2011-12-31 NOTE — Telephone Encounter (Signed)
Erin Moore WANTED DR. Darrold Moore TO BE AWARE THAT HER SISTER SAW DR. Rennis Moore ON Monday 12-27-11.  DR. Rennis Moore WAS GOING TO LET Erin. Erin Moore KNOW THE RESULTS OF THE LAB WORK.  SHE HAS NOT HEARD FROM Erin Moore AND IT IS Friday 12-31-11.   Erin. Erin Moore CANNOT BE REACHED UNTIL Tuesday 01-04-12, BUT WOULD LIKE [name] OR HER NURSE TO CALL Erin Moore' OFFICE TO SEE WHAT IS GOING ON.

## 2012-01-04 ENCOUNTER — Ambulatory Visit (HOSPITAL_BASED_OUTPATIENT_CLINIC_OR_DEPARTMENT_OTHER): Payer: Medicare Other | Admitting: Oncology

## 2012-01-04 ENCOUNTER — Telehealth: Payer: Self-pay | Admitting: Oncology

## 2012-01-04 ENCOUNTER — Other Ambulatory Visit (HOSPITAL_BASED_OUTPATIENT_CLINIC_OR_DEPARTMENT_OTHER): Payer: Medicare Other | Admitting: Lab

## 2012-01-04 VITALS — BP 129/61 | HR 67 | Temp 97.7°F | Ht 62.0 in | Wt 223.9 lb

## 2012-01-04 DIAGNOSIS — R4189 Other symptoms and signs involving cognitive functions and awareness: Secondary | ICD-10-CM

## 2012-01-04 DIAGNOSIS — E669 Obesity, unspecified: Secondary | ICD-10-CM

## 2012-01-04 DIAGNOSIS — C9190 Lymphoid leukemia, unspecified not having achieved remission: Secondary | ICD-10-CM

## 2012-01-04 DIAGNOSIS — C9110 Chronic lymphocytic leukemia of B-cell type not having achieved remission: Secondary | ICD-10-CM

## 2012-01-04 DIAGNOSIS — Z7901 Long term (current) use of anticoagulants: Secondary | ICD-10-CM

## 2012-01-04 LAB — COMPREHENSIVE METABOLIC PANEL
ALT: 11 U/L (ref 0–35)
AST: 22 U/L (ref 0–37)
Albumin: 4.3 g/dL (ref 3.5–5.2)
Alkaline Phosphatase: 70 U/L (ref 39–117)
BUN: 27 mg/dL — ABNORMAL HIGH (ref 6–23)
CO2: 29 mEq/L (ref 19–32)
Calcium: 9.3 mg/dL (ref 8.4–10.5)
Chloride: 105 mEq/L (ref 96–112)
Creatinine, Ser: 0.9 mg/dL (ref 0.50–1.10)
Glucose, Bld: 105 mg/dL — ABNORMAL HIGH (ref 70–99)
Potassium: 4.1 mEq/L (ref 3.5–5.3)
Sodium: 143 mEq/L (ref 135–145)
Total Bilirubin: 0.4 mg/dL (ref 0.3–1.2)
Total Protein: 5.8 g/dL — ABNORMAL LOW (ref 6.0–8.3)

## 2012-01-04 LAB — CBC WITH DIFFERENTIAL/PLATELET
BASO%: 0 % (ref 0.0–2.0)
Basophils Absolute: 0 10*3/uL (ref 0.0–0.1)
EOS%: 0.4 % (ref 0.0–7.0)
Eosinophils Absolute: 0.5 10*3/uL (ref 0.0–0.5)
HCT: 32.1 % — ABNORMAL LOW (ref 34.8–46.6)
HGB: 10.4 g/dL — ABNORMAL LOW (ref 11.6–15.9)
LYMPH%: 88.4 % — ABNORMAL HIGH (ref 14.0–49.7)
MCH: 29.7 pg (ref 25.1–34.0)
MCHC: 32.5 g/dL (ref 31.5–36.0)
MCV: 91.3 fL (ref 79.5–101.0)
MONO#: 1.6 10*3/uL — ABNORMAL HIGH (ref 0.1–0.9)
MONO%: 1.3 % (ref 0.0–14.0)
NEUT#: 12.2 10*3/uL — ABNORMAL HIGH (ref 1.5–6.5)
NEUT%: 9.9 % — ABNORMAL LOW (ref 38.4–76.8)
Platelets: 76 10*3/uL — ABNORMAL LOW (ref 145–400)
RBC: 3.52 10*6/uL — ABNORMAL LOW (ref 3.70–5.45)
RDW: 17.8 % — ABNORMAL HIGH (ref 11.2–14.5)
WBC: 122.9 10*3/uL (ref 3.9–10.3)
lymph#: 108.6 10*3/uL — ABNORMAL HIGH (ref 0.9–3.3)

## 2012-01-04 LAB — LACTATE DEHYDROGENASE: LDH: 199 U/L (ref 94–250)

## 2012-01-04 LAB — TECHNOLOGIST REVIEW

## 2012-01-04 NOTE — Progress Notes (Signed)
OFFICE PROGRESS NOTE Date of Visit 01-04-2012 Physicians: S.Wolters, L.Rennis Harding  INTERVAL HISTORY:  Patient is seen, together with caregiver, in continuing attention to her CLL. She was seen in consultation by Velta Addison on 12-27-2011, that note received and reviewed, and I have also spoken with Dr.Ellis by phone prior to patient's visit today. History is of CLL diagnosed fall 2011 after she presented to primary MD with transient fatigue and had WBC ~ 13k. She had cervical lymph node biopsy in Sept 2011 and bone marrow exam Nov 2011 with cytogenetics done at [facility]. She did not have high risk cytogenetics. She did, however, have rapid doubling time and platelets decreaed to < 100k. She and sister were not interested in consideration of transplant then. She was treated with fludara/Rituxan x 6 cycles from Feb 2012 thru July 2012, which she tolerated generally well. Despite regular review of medications, we learned after treatment completed that she had not taken prophylactic TMP/SMX as instructed. She had partial response to the fludara/rituxan by labs and repeat US to evaluate spleen size. We have followed since July 2012 with observation. WBC has been increasing since November and platelets have not improved since treatment was completed. Patient tells me that she has felt very well for past 2 weeks, since the lower respiratory infection resolved entirely. She denies fever, cough, sputum, chest pain, shortness of breath, other symptoms of infection, any bleeding. Appetite is back to baseline. She has had no nausea or vomiting and no diarrhea. She has no new or different pain. She has resumed workouts with trainer at gym. Remainder of 10 point Review of Systems negative/ unchanged. We discussed recommendation for repeat bone marrow exam due to ongoing thrombocytopenia, and patient understands that treatment recommendations will be made depending on results. She does not know her sister's schedule, so may  need to move the date if sister prefers, but will have schedulers set up with IR now. (Previous bone marrow was done by IR Jan 2012, which she tolerated well with the sedation used.)  Objective:  Vital signs in last 24 hours:  BP 129/61  Pulse 67  Temp(Src) 97.7 F (36.5 C) (Oral)  Ht 5\' 2"  (1.575 m)  Wt 223 lb 14.4 oz (101.56 kg)  BMI 40.95 kg/m2 Weight is up 2 lbs. Alert, talkative and in good spirits, looks comfortable.  HEENT:mucous membranes moist, pharynx normal without lesions. PERRL, not icteric. Normal hair pattern. No JVD. LymphaticsCervical, supraclavicular, and axillary nodes normal.Cannnot appreciate inguinal nodes. Resp: clear to auscultation bilaterally and normal percussion bilaterally Cardio: regular rate and rhythm GI: soft, non-tender; bowel sounds normal; no masses,  no organomegaly. Obese, nothing palpable Extremities: extremities normal, atraumatic, no cyanosis or edema Neuro:no sensory deficits noted Skin:small resolving ecchymosis on abdomen, no rash or petechiae.   Lab Results:   Basename 01/04/12 1302  WBC 122.9*  HGB 10.4*  HCT 32.1*  PLT 76*  ANC 12.2    RDW 17    MCV 91     88% lymphs  BMET  Basename 01/04/12 1302  NA 143  K 4.1  CL 105  CO2 29  GLUCOSE 105*  BUN 27*  CREATININE 0.90  CALCIUM 9.3  remainder of full CMET normal with exception of Tprotein 5.8  Studies/Results:  No results found.  Medications: I have reviewed the patient's current medications. Patient tells me that she has decreased premarin to once weekly for ~ past year "can I stop this now?" ; reports not telling any difference in how she feels  after the doses and I have told her to DC.  Assessment/Plan: 1.CLL: will repeat bone marrow exam by IR ? If megakaryocytes adequate or not. 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 I will see her back after the bone marrow is done. 2.recent pneumonia, resolved. Needs QIG yearly or sooner if recurrent sinopulmonary infections. 3.obesity 4.some cognitive disabiliy: sister is HCPOA and present caregiver seems to have good rapport with her. 5.long use of premarin, which she has tapered over past year and will DC.  Reece Packer, MD   01/04/2012, 8:58 PM

## 2012-01-04 NOTE — Patient Instructions (Signed)
Begin iron once daily, best to take on empty stomach with OJ.

## 2012-01-04 NOTE — Telephone Encounter (Signed)
Called [facility] to r/s bone marrow appt to afternoon and was told that they only do them in the moringing. Informed pt and she will keep appt for 03/28.  gv pt appt for april2013

## 2012-01-10 ENCOUNTER — Other Ambulatory Visit: Payer: Self-pay | Admitting: Physician Assistant

## 2012-01-13 ENCOUNTER — Ambulatory Visit (HOSPITAL_COMMUNITY)
Admission: RE | Admit: 2012-01-13 | Discharge: 2012-01-13 | Disposition: A | Discharge status: Home / Self Care | Payer: Medicare Other | Source: Ambulatory Visit | Attending: Oncology | Admitting: Oncology

## 2012-01-13 ENCOUNTER — Encounter (HOSPITAL_COMMUNITY): Payer: Self-pay

## 2012-01-13 DIAGNOSIS — C9110 Chronic lymphocytic leukemia of B-cell type not having achieved remission: Secondary | ICD-10-CM | POA: Insufficient documentation

## 2012-01-13 DIAGNOSIS — Z79899 Other long term (current) drug therapy: Secondary | ICD-10-CM | POA: Insufficient documentation

## 2012-01-13 DIAGNOSIS — C9190 Lymphoid leukemia, unspecified not having achieved remission: Secondary | ICD-10-CM

## 2012-01-13 DIAGNOSIS — I10 Essential (primary) hypertension: Secondary | ICD-10-CM | POA: Insufficient documentation

## 2012-01-13 DIAGNOSIS — D696 Thrombocytopenia, unspecified: Secondary | ICD-10-CM | POA: Insufficient documentation

## 2012-01-13 DIAGNOSIS — E785 Hyperlipidemia, unspecified: Secondary | ICD-10-CM | POA: Insufficient documentation

## 2012-01-13 LAB — APTT: aPTT: 27 seconds (ref 24–37)

## 2012-01-13 LAB — CBC
HCT: 32.3 % — ABNORMAL LOW (ref 36.0–46.0)
Hemoglobin: 10 g/dL — ABNORMAL LOW (ref 12.0–15.0)
MCH: 28.3 pg (ref 26.0–34.0)
MCHC: 31 g/dL (ref 30.0–36.0)
MCV: 91.5 fL (ref 78.0–100.0)
Platelets: 77 10*3/uL — ABNORMAL LOW (ref 150–400)
RBC: 3.53 MIL/uL — ABNORMAL LOW (ref 3.87–5.11)
RDW: 16.3 % — ABNORMAL HIGH (ref 11.5–15.5)
WBC: 147.1 10*3/uL (ref 4.0–10.5)

## 2012-01-13 LAB — PROTIME-INR
INR: 1.05 (ref 0.00–1.49)
Prothrombin Time: 13.9 seconds (ref 11.6–15.2)

## 2012-01-13 MED ORDER — FENTANYL CITRATE 0.05 MG/ML IJ SOLN
INTRAMUSCULAR | Status: AC | PRN
  Administered 2012-01-13: 50 ug via INTRAVENOUS

## 2012-01-13 MED ORDER — MIDAZOLAM HCL 5 MG/5ML IJ SOLN
INTRAMUSCULAR | Status: AC | PRN
  Administered 2012-01-13: 1 mg via INTRAVENOUS

## 2012-01-13 MED ORDER — MIDAZOLAM HCL 2 MG/2ML IJ SOLN
INTRAMUSCULAR | Status: AC
  Filled 2012-01-13: qty 4

## 2012-01-13 MED ORDER — FENTANYL CITRATE 0.05 MG/ML IJ SOLN
INTRAMUSCULAR | Status: AC
  Filled 2012-01-13: qty 4

## 2012-01-13 MED ORDER — SODIUM CHLORIDE 0.9 % IV SOLN
INTRAVENOUS | Status: DC
  Administered 2012-01-13: 08:00:00 via INTRAVENOUS

## 2012-01-13 NOTE — Procedures (Signed)
Technically successful CT guided bone marrow aspiration and biopsy of left iliac crest. No immediate complications. Awaiting pathology report.    

## 2012-01-13 NOTE — H&P (Signed)
Erin Moore is an 66 y.o. female.   Chief Complaint: "I'm here for a bone marrow biopsy" HPI: Patient with history of CLL and ongoing thrombocytopenia presents today for CT guided bone marrow biopsy.  Past Medical History  Diagnosis Date  . Hypertension   . Hyperlipemia   . Depression   . Osteoarthritis   . Mental disability   . Learning disorder   . CLL (chronic lymphocytic leukemia) FALL OF 2011    Past Surgical History  Procedure Date  . Abdominal hysterectomy   . Cesarean section   . Tubal ligation 1975    BILATERAL  . Breast reduction surgery   . Carpal tunnel release     BILATERAL  . Replacement total knee     LEFT KNEE  . Tonsillectomy     History reviewed. No pertinent family history. Social History:  reports that she has never smoked. She has never used smokeless tobacco. She reports that she does not drink alcohol or use illicit drugs.  Allergies:  Allergies  Allergen Reactions  . Ibuprofen (Childrens Advil) Hives    Medications Prior to Admission  Medication Sig Dispense Refill  . allopurinol (ZYLOPRIM) 100 MG tablet Take 1 tablet (100 mg total) by mouth daily.  30 tablet  1  . citalopram (CELEXA) 20 MG tablet Take 20 mg by mouth daily.      . Coenzyme Q10 (CO Q 10 PO) 50 mg. TAKE 2 TABS DAILY      . losartan-hydrochlorothiazide (HYZAAR) 100-12.5 MG per tablet Take 1 tablet by mouth daily.      Marland Kitchen OVER THE COUNTER MEDICATION THIMJ 2 TABS DAILY      . OVER THE COUNTER MEDICATION CORDYCEPS 2 TABS DAILY      . OVER THE COUNTER MEDICATION E-TEA 2 DAILY      . OVER THE COUNTER MEDICATION IMMUNE STIMULATOR 2 DAILY      . OVER THE COUNTER MEDICATION B-12 COMPLETE 10 DROPS TWICE A DAY      . OVER THE COUNTER MEDICATION BALANCED  B 1 DAILY      . OVER THE COUNTER MEDICATION PROBIOTIC 11 (2 A DAY)      . OVER THE COUNTER MEDICATION PROTEASE PLUS 1 (4 A DAY)      . OVER THE COUNTER MEDICATION VIT D 2 A DAY      . OVER THE COUNTER MEDICATION GERMANIUM 1 DAILY       . OVER THE COUNTER MEDICATION SKELETAL STRENGTH 4 A DAY      . OVER THE COUNTER MEDICATION ANTI GAS 2 A DAY      . OVER THE COUNTER MEDICATION WHEY PROTEIN DRINKS  2 TIMES A DAY      . estrogens, conjugated, (PREMARIN) 0.3 MG tablet Take 0.3 mg by mouth once a week. Take daily for 21 days then do not take for 7 days.       Medications Prior to Admission  Medication Dose Route Frequency Provider Last Rate Last Dose  . 0.9 %  sodium chloride infusion   Intravenous Continuous Simonne Come, MD 20 mL/hr at 01/13/12 [phone]      Results for orders placed during the hospital encounter of 01/13/12 (from the past 48 hour(s))  CBC     Status: Abnormal (Preliminary result)   Collection Time   01/13/12  8:20 AM      Component Value Range Comment   WBC 147.1 (*) 4.0 - 10.5 (K/uL)    RBC 3.53 (*) 3.87 -  5.11 (MIL/uL)    Hemoglobin 10.0 (*) 12.0 - 15.0 (g/dL)    HCT 56.2 (*) 13.0 - 46.0 (%)    MCV 91.5  78.0 - 100.0 (fL)    MCH 28.3  26.0 - 34.0 (pg)    MCHC 31.0  30.0 - 36.0 (g/dL)    RDW 86.5 (*) 78.4 - 15.5 (%)    Platelets PENDING  150 - 400 (K/uL)    No results found.  Review of Systems  Constitutional: Negative for fever and chills.  Respiratory: Negative for cough and shortness of breath.   Cardiovascular: Negative for chest pain.  Gastrointestinal: Negative for nausea, vomiting and abdominal pain.  Neurological: Negative for headaches.  Endo/Heme/Allergies: Does not bruise/bleed easily.    Blood pressure 138/56, pulse 72, temperature 97.5 F (36.4 C), temperature source Oral, resp. rate 20, height 5\' 2"  (1.575 m), weight 222 lb (100.699 kg), SpO2 96.00%. Physical Exam  Constitutional: She is oriented to person, place, and time. She appears well-developed and well-nourished.  Cardiovascular: Normal rate and regular rhythm.   Respiratory: Effort normal and breath sounds normal.  GI: Soft. Bowel sounds are normal. There is no tenderness.       obese  Musculoskeletal: Normal range of  motion. She exhibits no edema.  Neurological: She is alert and oriented to person, place, and time.     Assessment/Plan Patient with CLL and persistent thrombocytopenia; plan is for CT guided bone marrow biopsy.  Erin Moore,D KEVIN 01/13/2012, 8:51 AM

## 2012-01-13 NOTE — Discharge Instructions (Signed)
Bone Marrow Aspiration, Bone Marrow Biopsy  Care After  Read the instructions outlined below and refer to this sheet in the next few weeks. These discharge instructions provide you with general information on caring for yourself after you leave the hospital. Your caregiver may also give you specific instructions. While your treatment has been planned according to the most current medical practices available, unavoidable complications occasionally occur. If you have any problems or questions after discharge, call your caregiver.  FINDING OUT THE RESULTS OF YOUR TEST  Not all test results are available during your visit. If your test results are not back during the visit, make an appointment with your caregiver to find out the results. Do not assume everything is normal if you have not heard from your caregiver or the medical facility. It is important for you to follow up on all of your test results.   HOME CARE INSTRUCTIONS   You have had sedation and may be sleepy or dizzy. Your thinking may not be as clear as usual. For the next 24 hours:   Only take over-the-counter or prescription medicines for pain, discomfort, and or fever as directed by your caregiver.   Do not drink alcohol.   Do not smoke.   Do not drive.   Do not make important legal decisions.   Do not operate heavy machinery.   Do not care for small children by yourself.   Keep your dressing clean and dry. You may replace dressing with a bandage after 24 hours.   You may take a bath or shower after 24 hours.   Use an ice pack for 20 minutes every 2 hours while awake for pain as needed.  SEEK MEDICAL CARE IF:    There is redness, swelling, or increasing pain at the biopsy site.   There is pus coming from the biopsy site.   There is drainage from a biopsy site lasting longer than one day.   An unexplained oral temperature above 102 F (38.9 C) develops.  SEEK IMMEDIATE MEDICAL CARE IF:    You develop a rash.   You have difficulty  breathing.   You develop any reaction or side effects to medications given.  Document Released: 04/23/2005 Document Revised: 09/23/2011 Document Reviewed: 10/01/2008  ExitCare Patient Information 2012 [employer].

## 2012-01-18 ENCOUNTER — Other Ambulatory Visit: Payer: Self-pay | Admitting: Oncology

## 2012-01-19 ENCOUNTER — Other Ambulatory Visit (HOSPITAL_BASED_OUTPATIENT_CLINIC_OR_DEPARTMENT_OTHER): Payer: Medicare Other | Admitting: Lab

## 2012-01-19 ENCOUNTER — Other Ambulatory Visit: Payer: Self-pay | Admitting: Oncology

## 2012-01-19 ENCOUNTER — Telehealth: Payer: Self-pay | Admitting: Oncology

## 2012-01-19 ENCOUNTER — Other Ambulatory Visit: Payer: Self-pay

## 2012-01-19 ENCOUNTER — Ambulatory Visit (HOSPITAL_BASED_OUTPATIENT_CLINIC_OR_DEPARTMENT_OTHER): Payer: Medicare Other | Admitting: Oncology

## 2012-01-19 VITALS — BP 126/63 | HR 71 | Temp 98.3°F | Ht 63.0 in | Wt 226.9 lb

## 2012-01-19 DIAGNOSIS — C9190 Lymphoid leukemia, unspecified not having achieved remission: Secondary | ICD-10-CM

## 2012-01-19 DIAGNOSIS — C9110 Chronic lymphocytic leukemia of B-cell type not having achieved remission: Secondary | ICD-10-CM

## 2012-01-19 DIAGNOSIS — I10 Essential (primary) hypertension: Secondary | ICD-10-CM

## 2012-01-19 LAB — COMPREHENSIVE METABOLIC PANEL
ALT: 12 U/L (ref 0–35)
AST: 29 U/L (ref 0–37)
Albumin: 4.3 g/dL (ref 3.5–5.2)
Alkaline Phosphatase: 83 U/L (ref 39–117)
BUN: 35 mg/dL — ABNORMAL HIGH (ref 6–23)
CO2: 30 mEq/L (ref 19–32)
Calcium: 9.5 mg/dL (ref 8.4–10.5)
Chloride: 102 mEq/L (ref 96–112)
Creatinine, Ser: 0.83 mg/dL (ref 0.50–1.10)
Glucose, Bld: 92 mg/dL (ref 70–99)
Potassium: 4.5 mEq/L (ref 3.5–5.3)
Sodium: 139 mEq/L (ref 135–145)
Total Bilirubin: 0.5 mg/dL (ref 0.3–1.2)
Total Protein: 6.1 g/dL (ref 6.0–8.3)

## 2012-01-19 LAB — CBC WITH DIFFERENTIAL/PLATELET
BASO%: 0 % (ref 0.0–2.0)
Basophils Absolute: 0.1 10*3/uL (ref 0.0–0.1)
EOS%: 0.1 % (ref 0.0–7.0)
Eosinophils Absolute: 0.1 10*3/uL (ref 0.0–0.5)
HCT: 29.8 % — ABNORMAL LOW (ref 34.8–46.6)
HGB: 9.9 g/dL — ABNORMAL LOW (ref 11.6–15.9)
LYMPH%: 96.8 % — ABNORMAL HIGH (ref 14.0–49.7)
MCH: 29.4 pg (ref 25.1–34.0)
MCHC: 33.1 g/dL (ref 31.5–36.0)
MCV: 88.9 fL (ref 79.5–101.0)
MONO#: 2.1 10*3/uL — ABNORMAL HIGH (ref 0.1–0.9)
MONO%: 1.2 % (ref 0.0–14.0)
NEUT#: 3.3 10*3/uL (ref 1.5–6.5)
NEUT%: 1.9 % — ABNORMAL LOW (ref 38.4–76.8)
Platelets: 80 10*3/uL — ABNORMAL LOW (ref 145–400)
RBC: 3.35 10*6/uL — ABNORMAL LOW (ref 3.70–5.45)
RDW: 17.6 % — ABNORMAL HIGH (ref 11.2–14.5)
WBC: 173.5 10*3/uL (ref 3.9–10.3)
lymph#: 168 10*3/uL — ABNORMAL HIGH (ref 0.9–3.3)
nRBC: 0 % (ref 0–0)

## 2012-01-19 LAB — HAPTOGLOBIN: Haptoglobin: 66 mg/dL (ref 45–215)

## 2012-01-19 LAB — LACTATE DEHYDROGENASE: LDH: 241 U/L (ref 94–250)

## 2012-01-19 LAB — URIC ACID: Uric Acid, Serum: 7.4 mg/dL — ABNORMAL HIGH (ref 2.4–7.0)

## 2012-01-19 LAB — TECHNOLOGIST REVIEW

## 2012-01-19 MED ORDER — LORAZEPAM 0.5 MG PO TABS: 0.5000 mg | ORAL_TABLET | ORAL | Status: AC | PRN

## 2012-01-19 MED ORDER — ONDANSETRON HCL 8 MG PO TABS: 8.0000 mg | ORAL_TABLET | Freq: Three times a day (TID) | ORAL | Status: DC | PRN

## 2012-01-19 NOTE — Progress Notes (Signed)
OFFICE PROGRESS NOTE Date of Visit 01-19-2012 Physicians: S.Wolters, L.Rennis Harding  INTERVAL HISTORY:  Patient is seen, together with companion Renee Rival, in follow up of her progressive CLL, now post bone marrow exam done 01-13-2012 by interventional radiology. The bone marrow pathology ([id number]) found hypercellular marrow at 80 - 90% with small, mature lymphocytes 89% of total, decreased megakaryocytes, trilineage hematopoiesis, decreased iron stores. We have discussed the bone marrow results at visit; I have discussed with sister Bartholomew Crews by phone after the visit also (sister is HCPOA and did not accompany to visit due to URI). History is of CLL diagnosed fall 2011 after she presented to primary MD with transient fatigue and had WBC ~ 13k. She had cervical lymph node biopsy in Sept 2011 and bone marrow exam Nov 2011 with cytogenetics done at [facility]. She did not have high risk cytogenetics. She did, however, have rapid doubling time and platelets decreaed to < 100k. She and sister were not interested in consideration of transplant then. She was treated with fludara/Rituxan x 6 cycles from Feb 2012 thru July 2012, which she tolerated generally well. Despite regular review of medications, we learned after treatment completed that she had not taken prophylactic TMP/SMX as instructed. She had partial response to the fludara/rituxan by labs and repeat US to evaluate spleen size. We have followed since July 2012 with observation. WBC has been increasing since November and platelets have not improved since treatment was completed. She was seen in consultation by Velta Addison at [facility] on 12-27-2011; Dr Rennis Harding recommends bendamustine with rituxan with findings on bone marrow as above.  Patient tolerated the bone marrow procedure without difficulty and had no bleeding at site. 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 This is not  specifically shortness of breath and she has no pain. She bruises easily, but has seen no bleeding. Appetite is good, no GI symptoms, no cough, no upper respiratory symptoms, no skin rash. Remainder of 10 point Review of Systems negative.  We have discussed starting treatment with bendamustine and rituxan; patient and Ms.Dodd have had teaching by my RN and I have discussed with sister by phone. She will have treanda at 70 mg/m2 day 1 and day 2 with rituxan day 1. She had nausea and hypotension with rate increases in rituxan during her initial treatment. They understand that she should not take antihypertensives AM of rx and that she should be well hydrated. She will probably need to be off allopurinol around treanda and I will confirm with pharmacist whether or not she can still use this intermittently thru treatment. I have told her that we will probably try 6 cycles if she tolerates. She is reluctant to consider PAC, tho will need this if venous access is not adequate for the treanda. Patient was comfortable with information and was in agreement with proceeding as soon as availability for treatment, which will be 4-11 for 7 hrs and 4-12 for 2 hrs.  Sister tells me that Ms.Pollie Friar will be working with patient full-time, possibly with a different living arrangement. Sister would like [place] to give Ms.Dodd information and will let our intake staff know this.  Objective:  Vital signs in last 24 hours:  BP 126/63  Pulse 71  Temp(Src) 98.3 F (36.8 C) (Oral)  Ht 5\' 3"  (1.6 m)  Wt 226 lb 14.4 oz (102.921 kg)  BMI 40.19 kg/m2 This weight is up 3 lbs. Alert, looks comfortable,  respirations not labored, ambulatory easily, not diaphoretic.Interactions with caregiver during visit seem helpful.  HEENT:mucous membranes moist, pharynx normal without lesions. Normal hair pattern. No JVD LymphaticsCervical, supraclavicular, and axillary nodes normal. and soft cervical lymph nodes up to 1 cm R>L. No supraclavicular  nodes. Cannot appreciate inguinal nodes. Resp: clear to auscultation bilaterally and normal percussion bilaterally Bone marrow site left posterior iliac crest with resolving ecchymosis ~ 5 cm diameter, no unusual tenderness, no drainage or heat. Cardio: regular rate and rhythm GI: obese, soft, nontender, cannot appreciate HSM, normal bowel sounds Extremities: extremities normal, atraumatic, no cyanosis or edema Neuro:nonfocal UE veins easily visible. Skin otherwise no rash or petechiae, one resolving ecchymosis anterior abdomen on left. Lab Results:   Basename 01/19/12 1203  WBC 173.5*  HGB 9.9*  HCT 29.8*  PLT 80*  ANC 3.3 MCV 89 RDW 17.6 Haptoglobin 66 BMET  Basename 01/19/12 1203  NA 139  K 4.5  CL 102  CO2 30  GLUCOSE 92  BUN 35*  CREATININE 0.83  CALCIUM 9.5   Remainder of full CMET normal including LFTs and Tbili 0.5, Tprot 6.1 LDH 241 Uric acid 7.4 on allopurinol Studies/Results: Bone marrow exam 01-13-2012 as above. Medications: I have reviewed the patient's current medications. We will call in zofran and ativan as antiemetics, tho she may have some of these already at home.  Patient and caregiver followed discussion well at office and had questions answered to their satisfaction; sister likewise was in agreement with plan per my phone conversation with her.  Time spent 45 min, > 50% in discussion and coordination of care. Assessment/Plan: 1. Progressive CLL with extensive marrow involvement causing thrombocytopenia and anemia: will begin second line treatment with bendamustine and rituxan 4-11 and 01-28-2012. I will see her in infusion at least 4-12 and for visit with counts and chemistries the following week. Note nadir for treanda expected week 3. 2.some mental disability, sister as HCPOA and now full time caregiver 3.obesity 4.pneumonia resolved. 5.HTN  Doniel Maiello P, MD   01/19/2012, 8:47 PM

## 2012-01-19 NOTE — Patient Instructions (Signed)
Do not take regular medicines days of chemotherapy.  Dr.Lovelee Forner will let you know if you need to stop allopurinol longer than just those days.  We will call in nausea medicines to your pharmacy.  If the nurses think you need a portacath, we will set that up.  Dr.Donyea Gafford will call your sister

## 2012-01-19 NOTE — Telephone Encounter (Signed)
gv pt appt schedule for April. Pt aware lb added for 4/11 @ 9 am. Per 4/3 pof no lb 4/12 order entered in error and LL to see pt in inf 4/12.

## 2012-01-20 ENCOUNTER — Encounter: Payer: Self-pay | Admitting: Oncology

## 2012-01-20 DIAGNOSIS — I10 Essential (primary) hypertension: Secondary | ICD-10-CM | POA: Insufficient documentation

## 2012-01-21 ENCOUNTER — Telehealth: Payer: Self-pay

## 2012-01-21 ENCOUNTER — Other Ambulatory Visit: Payer: Self-pay

## 2012-01-21 DIAGNOSIS — C9110 Chronic lymphocytic leukemia of B-cell type not having achieved remission: Secondary | ICD-10-CM

## 2012-01-21 DIAGNOSIS — C9190 Lymphoid leukemia, unspecified not having achieved remission: Secondary | ICD-10-CM

## 2012-01-21 MED ORDER — HYDROCODONE-ACETAMINOPHEN 5-325 MG PO TABS: ORAL_TABLET | ORAL | Status: DC

## 2012-01-21 NOTE — Telephone Encounter (Signed)
Erin Moore SISTER OF Erin Moore AND POA CALLED STATING THAT THEY ARE HAVING SECOND THOUGHTS ABOUT THE NEW  CHEMOTHERAPY TREANDA WHICH IS BEING ADDED TO THE RITUXAN THERAPY.  SHE HAS A LOT OF QUESTIONS ABOUT THE DRUG.  THERE ARE A LOT MORE SIDE-EFFECTS WITH THIS CHEMOTHERAPY.

## 2012-01-21 NOTE — Telephone Encounter (Signed)
Erin Moore CALLED STATING THAT [name] WAS CC OF PAIN IN THE LEFT HIP AND GROIN. ONSET YESTERDAY.  SHE HAD THE BM BX DONE ON THIS SIDE 01-13-12.  THE SITE IS WARM TO THE TOUCH.  AREA IS BRUISED BUT HEALING.  AFEBRILE. IT HURTS TO WALK AND GET UP AND DOWN. SPOKE WITH Erin Moore AND CARE GIVER. [phone].  TOLD THEM THAT DR. Darrold Span SUGGESTS TO DECREASE ACTIVITY, ESPECIALLY EXERCISING  UNTIL HIP AND GROIN BETTER.  SHE CAN APPLY ICE TO THE BX SITE AS NEEDED IF IT HELPS WITH COMFORT. CALLED IN NORCO 5-325 TO HT FOR HER TO USE IF NEEDED FOR PAIN. PT. CARE GIVER VERBALIZED UNDERSTANDING.

## 2012-01-21 NOTE — Telephone Encounter (Signed)
MD tried to return call to sister re concerns about planned bendamustine, without answer. I left message for her to call back on Mon 4-8. She may want full teaching by RN also, and might prefer a different day to start treatment.

## 2012-01-24 ENCOUNTER — Other Ambulatory Visit: Payer: Self-pay | Admitting: Oncology

## 2012-01-24 DIAGNOSIS — C9190 Lymphoid leukemia, unspecified not having achieved remission: Secondary | ICD-10-CM

## 2012-01-24 DIAGNOSIS — C9110 Chronic lymphocytic leukemia of B-cell type not having achieved remission: Secondary | ICD-10-CM

## 2012-01-25 ENCOUNTER — Telehealth: Payer: Self-pay | Admitting: *Deleted

## 2012-01-26 ENCOUNTER — Other Ambulatory Visit: Payer: Self-pay | Admitting: Oncology

## 2012-01-26 ENCOUNTER — Encounter: Payer: Self-pay | Admitting: Oncology

## 2012-01-26 ENCOUNTER — Telehealth: Payer: Self-pay

## 2012-01-26 ENCOUNTER — Telehealth: Payer: Self-pay | Admitting: Oncology

## 2012-01-26 ENCOUNTER — Other Ambulatory Visit: Payer: Self-pay

## 2012-01-26 DIAGNOSIS — C9190 Lymphoid leukemia, unspecified not having achieved remission: Secondary | ICD-10-CM

## 2012-01-26 DIAGNOSIS — C9110 Chronic lymphocytic leukemia of B-cell type not having achieved remission: Secondary | ICD-10-CM

## 2012-01-26 MED ORDER — SODIUM CHLORIDE 0.9 % IV SOLN: INTRAVENOUS | Status: DC

## 2012-01-26 MED ORDER — LORAZEPAM 1 MG PO TABS: 1.0000 mg | ORAL_TABLET | Freq: Once | ORAL | Status: DC

## 2012-01-26 MED ORDER — ACYCLOVIR 400 MG PO TABS: 400.0000 mg | ORAL_TABLET | Freq: Every day | ORAL | Status: DC

## 2012-01-26 MED ORDER — ONDANSETRON 16 MG/50ML IVPB (CHCC): 16.0000 mg | Freq: Once | INTRAVENOUS | Status: DC

## 2012-01-26 NOTE — Telephone Encounter (Signed)
Returned call to Erin Moore, who had just left home. Message left with her husband now, who understands that Erin Moore is welcome to call again if she needs anything further. Told husband that patient can use hydrocodone for hip as she has been doing, including before treatment; suggested they bring this medication to office for the initial long treatment in case she needs additional that day. Explained that we would give antiemetics here, so would not need to give ativan prior to coming. Told him that acyclovir is prophylactic due to treatment for CLL and has been called in to [facility] on [address]. Husband wrote down information and will let his wife know.

## 2012-01-26 NOTE — Telephone Encounter (Signed)
SPOKE WITH MS. Lillia Mountain' HUSBAND AS SHE HAD GONE TO AN ACTIVITY FOR THEIR GRANDCHILDREN. TOLD HIM THAT THE ACYCLOVIR PRESCRIPTION  WAS SENT TO THE PHARMACY. MS. Tetrick CAN USE THE HYDROCODONE FOR HIP PAIN PRIOR TO THE TREATMENT WITH FOOD.  SHE SHOULD BRING THE MEDICATION TO THE TREATMENT INCASE SHE NEEDS MORE SINCE SHE WILL BE HERE AWHILE. MS. Mireles WILL BE RECEIVING ATIVAN HERE PRIOR TO THE CHEMOTHERAPY SO SHE SHOULD NOT TAKE AT HOME PRIOR TO THE TREATMENT. HUSBAND WROTE INSTRUCTIONS DOWN AND VERBALIZED UNDERSTANDING.

## 2012-01-26 NOTE — Progress Notes (Unsigned)
My answers to all of sister's questions from 01-24-2012 were written out for RN, who discussed with sister when sister returned our calls on 01-25-2012. That written information given to HIM to scan into EMR.

## 2012-01-27 ENCOUNTER — Ambulatory Visit (HOSPITAL_BASED_OUTPATIENT_CLINIC_OR_DEPARTMENT_OTHER): Payer: Medicare Other

## 2012-01-27 ENCOUNTER — Other Ambulatory Visit: Payer: Self-pay | Admitting: Oncology

## 2012-01-27 ENCOUNTER — Other Ambulatory Visit (HOSPITAL_BASED_OUTPATIENT_CLINIC_OR_DEPARTMENT_OTHER): Payer: Medicare Other | Admitting: Lab

## 2012-01-27 VITALS — BP 124/76 | HR 108 | Temp 98.9°F

## 2012-01-27 DIAGNOSIS — C9190 Lymphoid leukemia, unspecified not having achieved remission: Secondary | ICD-10-CM

## 2012-01-27 DIAGNOSIS — C9110 Chronic lymphocytic leukemia of B-cell type not having achieved remission: Secondary | ICD-10-CM

## 2012-01-27 DIAGNOSIS — Z5111 Encounter for antineoplastic chemotherapy: Secondary | ICD-10-CM

## 2012-01-27 LAB — CBC WITH DIFFERENTIAL/PLATELET
BASO%: 0.1 % (ref 0.0–2.0)
Basophils Absolute: 0.2 10*3/uL — ABNORMAL HIGH (ref 0.0–0.1)
EOS%: 0.3 % (ref 0.0–7.0)
Eosinophils Absolute: 0.5 10*3/uL (ref 0.0–0.5)
HCT: 29.7 % — ABNORMAL LOW (ref 34.8–46.6)
HGB: 9.8 g/dL — ABNORMAL LOW (ref 11.6–15.9)
LYMPH%: 96.8 % — ABNORMAL HIGH (ref 14.0–49.7)
MCH: 29.2 pg (ref 25.1–34.0)
MCHC: 33 g/dL (ref 31.5–36.0)
MCV: 88.4 fL (ref 79.5–101.0)
MONO#: 1.8 10*3/uL — ABNORMAL HIGH (ref 0.1–0.9)
MONO%: 1 % (ref 0.0–14.0)
NEUT#: 3.2 10*3/uL (ref 1.5–6.5)
NEUT%: 1.8 % — ABNORMAL LOW (ref 38.4–76.8)
Platelets: 70 10*3/uL — ABNORMAL LOW (ref 145–400)
RBC: 3.36 10*6/uL — ABNORMAL LOW (ref 3.70–5.45)
RDW: 17.3 % — ABNORMAL HIGH (ref 11.2–14.5)
WBC: 181 10*3/uL (ref 3.9–10.3)
lymph#: 175.3 10*3/uL — ABNORMAL HIGH (ref 0.9–3.3)
nRBC: 0 % (ref 0–0)

## 2012-01-27 LAB — URIC ACID: Uric Acid, Serum: 8.3 mg/dL — ABNORMAL HIGH (ref 2.4–7.0)

## 2012-01-27 LAB — BASIC METABOLIC PANEL
BUN: 24 mg/dL — ABNORMAL HIGH (ref 6–23)
CO2: 29 mEq/L (ref 19–32)
Calcium: 9.5 mg/dL (ref 8.4–10.5)
Chloride: 101 mEq/L (ref 96–112)
Creatinine, Ser: 0.96 mg/dL (ref 0.50–1.10)
Glucose, Bld: 95 mg/dL (ref 70–99)
Potassium: 4.4 mEq/L (ref 3.5–5.3)
Sodium: 140 mEq/L (ref 135–145)

## 2012-01-27 LAB — TECHNOLOGIST REVIEW

## 2012-01-27 MED ORDER — LORAZEPAM 2 MG/ML IJ SOLN
0.5000 mg | Freq: Once | INTRAMUSCULAR | Status: AC
  Administered 2012-01-27: 0.5 mg via INTRAVENOUS

## 2012-01-27 MED ORDER — DEXAMETHASONE SODIUM PHOSPHATE 10 MG/ML IJ SOLN
10.0000 mg | Freq: Once | INTRAMUSCULAR | Status: AC
  Administered 2012-01-27: 10 mg via INTRAVENOUS

## 2012-01-27 MED ORDER — ACETAMINOPHEN 325 MG PO TABS
650.0000 mg | ORAL_TABLET | Freq: Once | ORAL | Status: AC
  Administered 2012-01-27: 650 mg via ORAL

## 2012-01-27 MED ORDER — DIPHENHYDRAMINE HCL 25 MG PO CAPS
50.0000 mg | ORAL_CAPSULE | Freq: Once | ORAL | Status: AC
  Administered 2012-01-27: 50 mg via ORAL

## 2012-01-27 MED ORDER — ONDANSETRON 16 MG/50ML IVPB (CHCC)
16.0000 mg | Freq: Once | INTRAVENOUS | Status: AC
  Administered 2012-01-27: 16 mg via INTRAVENOUS

## 2012-01-27 MED ORDER — SODIUM CHLORIDE 0.9 % IV SOLN
70.0000 mg/m2 | Freq: Once | INTRAVENOUS | Status: AC
  Administered 2012-01-27: 150 mg via INTRAVENOUS
  Filled 2012-01-27: qty 30

## 2012-01-27 MED ORDER — LORAZEPAM 1 MG PO TABS
0.5000 mg | ORAL_TABLET | ORAL | Status: DC
  Administered 2012-01-27: 0.5 mg via ORAL

## 2012-01-27 MED ORDER — SODIUM CHLORIDE 0.9 % IV SOLN
Freq: Once | INTRAVENOUS | Status: AC
  Administered 2012-01-27: 10:00:00 via INTRAVENOUS

## 2012-01-27 MED ORDER — PANTOPRAZOLE SODIUM 40 MG IV SOLR
40.0000 mg | Freq: Once | INTRAVENOUS | Status: AC
  Administered 2012-01-27: 40 mg via INTRAVENOUS
  Filled 2012-01-27: qty 40

## 2012-01-27 MED ORDER — SODIUM CHLORIDE 0.9 % IV SOLN
375.0000 mg/m2 | Freq: Once | INTRAVENOUS | Status: AC
  Administered 2012-01-27: 800 mg via INTRAVENOUS
  Filled 2012-01-27: qty 80

## 2012-01-27 NOTE — Progress Notes (Unsigned)
1000-OK to treat despite counts per Dr. Darrold Span on 01/27/12 and 01/28/12.  1205-Pt complaining of nausea and is diaphoretic.  Rituxan stopped and Dr. Darrold Span notified.  VS stable.  Order received to give additional Ativan 0.5mg  IV and Protonix IV.  1245-Pt is now without complaints and is ready to restart Rituxan.  Rituxan re-started at 100 mg/hr-42 ml/hr.

## 2012-01-27 NOTE — Patient Instructions (Signed)
[facility] Discharge Instructions for Patients Receiving Chemotherapy  Today you received the following chemotherapy agents ttaxol/ treanda  To help prevent nausea and vomiting after your treatment, we encourage you to take your nausea medication  and take it as often as prescribed for the next   If you develop nausea and vomiting that is not controlled by your nausea medication, call the clinic. If it is after clinic hours your family physician or the after hours number for the clinic or go to the Emergency Department.   BELOW ARE SYMPTOMS THAT SHOULD BE REPORTED IMMEDIATELY:  *FEVER GREATER THAN 100.5 F  *CHILLS WITH OR WITHOUT FEVER  NAUSEA AND VOMITING THAT IS NOT CONTROLLED WITH YOUR NAUSEA MEDICATION  *UNUSUAL SHORTNESS OF BREATH  *UNUSUAL BRUISING OR BLEEDING  TENDERNESS IN MOUTH AND THROAT WITH OR WITHOUT PRESENCE OF ULCERS  *URINARY PROBLEMS  *BOWEL PROBLEMS  UNUSUAL RASH Items with * indicate a potential emergency and should be followed up as soon as possible.  One of the nurses will contact you 24 hours after your treatment. Please let the nurse know about any problems that you may have experienced. Feel free to call the clinic you have any questions or concerns. The clinic phone number is [phone].   I have been informed and understand all the instructions given to me. I know to contact the clinic, my physician, or go to the Emergency Department if any problems should occur. I do not have any questions at this time, but understand that I may call the clinic during office hours   should I have any questions or need assistance in obtaining follow up care.    __________________________________________  _____________  __________ Signature of Patient or Authorized Representative            Date                   Time    __________________________________________ Nurse's Signature

## 2012-01-28 ENCOUNTER — Ambulatory Visit (HOSPITAL_BASED_OUTPATIENT_CLINIC_OR_DEPARTMENT_OTHER): Payer: Medicare Other | Admitting: Oncology

## 2012-01-28 ENCOUNTER — Encounter: Payer: Self-pay | Admitting: Oncology

## 2012-01-28 ENCOUNTER — Encounter: Payer: Self-pay | Admitting: Certified Registered Nurse Anesthetist

## 2012-01-28 ENCOUNTER — Ambulatory Visit (HOSPITAL_BASED_OUTPATIENT_CLINIC_OR_DEPARTMENT_OTHER): Payer: Medicare Other

## 2012-01-28 ENCOUNTER — Telehealth: Payer: Self-pay

## 2012-01-28 VITALS — BP 159/71 | HR 97 | Temp 98.2°F

## 2012-01-28 DIAGNOSIS — C9110 Chronic lymphocytic leukemia of B-cell type not having achieved remission: Secondary | ICD-10-CM

## 2012-01-28 DIAGNOSIS — D6481 Anemia due to antineoplastic chemotherapy: Secondary | ICD-10-CM

## 2012-01-28 DIAGNOSIS — R7989 Other specified abnormal findings of blood chemistry: Secondary | ICD-10-CM

## 2012-01-28 DIAGNOSIS — Z5111 Encounter for antineoplastic chemotherapy: Secondary | ICD-10-CM

## 2012-01-28 DIAGNOSIS — C9190 Lymphoid leukemia, unspecified not having achieved remission: Secondary | ICD-10-CM

## 2012-01-28 DIAGNOSIS — T451X5A Adverse effect of antineoplastic and immunosuppressive drugs, initial encounter: Secondary | ICD-10-CM

## 2012-01-28 DIAGNOSIS — D6959 Other secondary thrombocytopenia: Secondary | ICD-10-CM

## 2012-01-28 MED ORDER — SODIUM CHLORIDE 0.9 % IV SOLN
70.0000 mg/m2 | Freq: Once | INTRAVENOUS | Status: AC
  Administered 2012-01-28: 150 mg via INTRAVENOUS
  Filled 2012-01-28: qty 30

## 2012-01-28 MED ORDER — DEXAMETHASONE SODIUM PHOSPHATE 10 MG/ML IJ SOLN
10.0000 mg | Freq: Once | INTRAMUSCULAR | Status: AC
  Administered 2012-01-28: 10 mg via INTRAVENOUS

## 2012-01-28 MED ORDER — SODIUM CHLORIDE 0.9 % IV SOLN
Freq: Once | INTRAVENOUS | Status: AC
  Administered 2012-01-28: 12:00:00 via INTRAVENOUS

## 2012-01-28 MED ORDER — ONDANSETRON 16 MG/50ML IVPB (CHCC)
16.0000 mg | Freq: Once | INTRAVENOUS | Status: AC
  Administered 2012-01-28: 16 mg via INTRAVENOUS

## 2012-01-28 MED ORDER — LORAZEPAM 1 MG PO TABS
1.0000 mg | ORAL_TABLET | Freq: Once | ORAL | Status: AC
  Administered 2012-01-28: 1 mg via ORAL

## 2012-01-28 NOTE — Progress Notes (Signed)
OFFICE PROGRESS NOTE Date of Visit 01-28-2012 Physicians: L.Rennis Harding, Kathie Rhodes.Wolters  INTERVAL HISTORY:  Patient is seen, together with caregiver Renee Rival, in infusion area where she is receiving day 2 cycle 1 treanda/rituxan. She is feeling very well today and did not have any problems overnight. Day 1 treatment yesterday went smoothly overall, tho she did have nausea x1 after getting up to BR during Rituxan and despite premedication antiemetics. Rituxan was held briefly then, with additiional IVF and IV antiemetics given, then was resumed and completed without difficulty. Note rate was confirmed with pharmacy based on Rituxan rates tolerated with her treatment in 2012. This MD was at office and discussed antiemetics and rate of rituxan with pharmacist prior to patient's arrival on 01-27-2012, and discussed nausea during rituxan directly with infusion RN at that time.  Patient also mistakenly had peripheral IV removed prior to completion of day 1 treatment, this restarted for treanda. IV access was obtained this am without difficulty and she has had no problems with sites from 01-27-2012.  Patient has been drinking fluids well, has had no nausea or vomiting, is voiding, no hip pain today (went to chiropractor 01-26-12 and did have decadron with premeds yesterday and today). She has had no bleeding, no symptoms of infection, did not take allopurinol prior to coming to office today but has taken this now. We have discussed the appointments scheduled for IVF with prn IV antiemetics on 01-29-12, and lab + MD visits 4-15 and 4-19. Patient has a hair appointment on 4-13, but understands that I feel the safest thing would be to keep the appointment for IVF if she is not drinking fluids extremely well or if any nausea. Likewise I feel that it is important for her care as we are beginning this treatment to check labs and let me see her next week as scheduled. Please see sister's phone message to my RN this AM in regards to  appointments. Nursing administration has been informed of sister's concerns from 01-27-2012, including vitals and IV, and I expect that they will be in touch with sister also.  History is of CLL diagnosed fall 2011 after she presented to primary MD with transient fatigue and had WBC ~ 13k. She had cervical lymph node biopsy in Sept 2011 and bone marrow exam Nov 2011 with cytogenetics done at [facility]. She did not have high risk cytogenetics. She did, however, have rapid doubling time and platelets decreaed to < 100k. She and sister were not interested in consideration of transplant then. She was treated with fludara/Rituxan x 6 cycles from Feb 2012 thru July 2012, which she tolerated generally well. Despite regular review of medications, we learned after treatment completed that she had not taken prophylactic TMP/SMX as instructed. She had partial response to the fludara/rituxan by labs and repeat US to evaluate spleen size. We have followed since July 2012 with observation. WBC has been increasing since November and platelets have not improved since treatment was completed. She was seen in consultation by Velta Addison at [facility] on 12-27-2011. Bone marrow exam done 01-13-2012 [phone]) found hypercellular marrow at 80 - 90% with small, mature lymphocytes 89% of total, decreased megakaryocytes, trilineage hematopoiesis, decreased iron stores.  Dr Rennis Harding recommended bendamustine with rituxan with findings on bone marrow as above, bendamustine dose adjusted due to previous treatment. CBC 01-27-2012 prior to treatment had WBC 181 with ANC 3.2, Hgb 9.8 and plt 70K. BUN was 24, creatinine 0.9 and uric acid 8.3 also on 01-27-12.  Dr.Ellis agreed with use  of gCSF, which I plan for next week.  Per my discussions with pharmacist, and as patient has baseline elevation in uric acid for which she is chronically on allopurinol, I have instructed patient to continue allopurinol, as risk of elevated uric acid appears  greater in her than risk of skin reaction. I have also instructed them to call if any rash and hold allopurinol if so. She did hold antihypertensives prior to rituxan yesterday.   Objective:  Vital signs in last 24 hours: Vitals done in infusion today BP 159/71, HR 97 and regular, temp 98.2 oral, respirations 20 and not labored on RA. Alert, appears comfortable in recliner on RA, with caregiver present and very supportive. Peripheral IV infusing in upper extremity, site not remarkable.   HEENT:mucous membranes moist, pharynx normal without lesions PERRL, not icteric. No alopecia. No JVD. Lymphatics no change in small cervical adenopathy, no supraclavicular nodes. Resp: clear to auscultation bilaterally Cardio: regular rate and rhythm GI: soft, nontender, some bowel sounds, not distended. Extremities: extremities normal, atraumatic, no cyanosis or edema Neuro: speech fluent and appropriate, moves all extremities easily, no focal deficits. Skin on partial exam (done in infusion area) without rash or other findings of concern.  Lab Results:   [facility] 01/27/12 0914  WBC 181.0*  HGB 9.8*  HCT 29.7*  PLT 70*  ANC 3.2  BMET  Basename 01/27/12 0914  NA 140  K 4.4  CL 101  CO2 29  GLUCOSE 95  BUN 24*  CREATININE 0.96  CALCIUM 9.5   Uric acid 7.4 on 4-3 and 8.3 on 4-11 Studies/Results:  No results found.  Medications: I have reviewed the patient's current medications. Will increase allopurinol to 200 mg daily beginning now and adjust dose depending on labs next week. I have reviewed instructions to hold allopurinol and call if skin rash. AVS given to patient/ caregiver.   Assessment/Plan: 1. Progressive CLL with extensive marrow involvement causing thrombocytopenia and anemia: Day 2 Cycle 1 treanda/rituxan today. For close follow up including chemistries and other plan as above. Note nadir for treanda ~ week 3, cycles q 4 wk. 2. Chronic elevation in uric acid with family hx  gout: increase allopurinol now, and could increase further depending on labs 4-15. 3. Some mental disability, sister as HCPOA 4.HTN: fine to resume antihypertensives now. 5.obesity Patient and caregiver in agreement with plan and know that they can call at any time if needed. Reece Packer, MD   01/28/2012, 12:29 PM

## 2012-01-28 NOTE — Telephone Encounter (Signed)
MS. Erin Moore CALLED WANTED TO LET DR. Darrold Span KNOW SOME DISTURBING THINGS THAT OCCURRED YESTERDAY WITH MS. Erin Moore'S TREATMENT. SHE HAD NO VSS TAKEN IN THE AFTERNOON OR PRIOR TO DISCHARGE. THE NURSE THAT TOOK OVER FOR Erin Moore TOOK MS, Erin Moore'S IV OUT NOT KNOWING THAT SHE HAD ANOTHER BAG OF CHEMO TOT GIVE SO ANOTHER IV NEEDED TO BE STARTED. MS. Erin Moore DOES NOT WANT Erin Moore TO BE NEAR THIS NURSE EVER AGAIN. MS. Erin Moore IS SCHEDULED FOR FLUIDS ON SAT AT 0800.  MS. Erin Moore HAS SOMETHING ALREADY SCHEDULED FOR SAT. AND IS UPSET.  Erin Moore THOUGHT IN CONVERSING WITH Erin Moore THAT THIS WAS INCASE DR.LIVESAY FELT Erin Moore NEEDED FLUIDS. SHE IS ALSO UPSET ABOUT Erin Moore. BEING SCHEDULED FOR LAB AND SEE DR. Darrold Span ON 01-31-12 AS WELL AS 02-04-12.  SHE HOPES THAT THIS IS JUST A SCHEDULING ERROR. PLS CALRIFY. THIS MAKES NO SENSE. MS. Erin Moore WANTS DR. Darrold Span TO SPEAK WITH Erin Moore AFTER SHE SEES Erin Moore TODAY.

## 2012-01-28 NOTE — Patient Instructions (Signed)
[facility] Discharge Instructions for Patients Receiving Chemotherapy  Today you received the following chemotherapy agents Treanda  To help prevent nausea and vomiting after your treatment, we encourage you to take your nausea medication Begin taking it at 7 pm and take it as often as prescribed for the next 24 to 72 hours.   If you develop nausea and vomiting that is not controlled by your nausea medication, call the clinic. If it is after clinic hours your family physician or the after hours number for the clinic or go to the Emergency Department.   BELOW ARE SYMPTOMS THAT SHOULD BE REPORTED IMMEDIATELY:  *FEVER GREATER THAN 100.5 F  *CHILLS WITH OR WITHOUT FEVER  NAUSEA AND VOMITING THAT IS NOT CONTROLLED WITH YOUR NAUSEA MEDICATION  *UNUSUAL SHORTNESS OF BREATH  *UNUSUAL BRUISING OR BLEEDING  TENDERNESS IN MOUTH AND THROAT WITH OR WITHOUT PRESENCE OF ULCERS  *URINARY PROBLEMS  *BOWEL PROBLEMS  UNUSUAL RASH Items with * indicate a potential emergency and should be followed up as soon as possible.  One of the nurses will contact you 24 hours after your treatment. Please let the nurse know about any problems that you may have experienced. Feel free to call the clinic you have any questions or concerns. The clinic phone number is [phone].   I have been informed and understand all the instructions given to me. I know to contact the clinic, my physician, or go to the Emergency Department if any problems should occur. I do not have any questions at this time, but understand that I may call the clinic during office hours   should I have any questions or need assistance in obtaining follow up care.    __________________________________________  _____________  __________ Signature of Patient or Authorized Representative            Date                   Time    __________________________________________ Nurse's Signature    

## 2012-01-28 NOTE — Patient Instructions (Signed)
Increase allopurinol to 200 mg each day, which will be 2 of the 100 mg tablets each day. Take 1 more of the allopurinol tablets today, for total of 2 today.  If you have any skin rash, stop allopurinol and call Dr.Kainan Patty's office.  Office phone number [phone], 24 hours daily.

## 2012-01-29 ENCOUNTER — Ambulatory Visit (HOSPITAL_BASED_OUTPATIENT_CLINIC_OR_DEPARTMENT_OTHER): Payer: Medicare Other

## 2012-01-29 ENCOUNTER — Other Ambulatory Visit: Payer: Self-pay | Admitting: *Deleted

## 2012-01-29 DIAGNOSIS — C9190 Lymphoid leukemia, unspecified not having achieved remission: Secondary | ICD-10-CM

## 2012-01-29 DIAGNOSIS — C9110 Chronic lymphocytic leukemia of B-cell type not having achieved remission: Secondary | ICD-10-CM

## 2012-01-29 MED ORDER — SODIUM CHLORIDE 0.9 % IV SOLN
INTRAVENOUS | Status: DC
  Administered 2012-01-29: 09:00:00 via INTRAVENOUS

## 2012-01-29 NOTE — Progress Notes (Signed)
0830-Pt denies any N/V this morning.  States that she is feeling well and is ready to "get this done so I can get my hair done."  Ativan not given d/t pt drove herself to [facility] today.

## 2012-01-31 ENCOUNTER — Ambulatory Visit (HOSPITAL_BASED_OUTPATIENT_CLINIC_OR_DEPARTMENT_OTHER): Payer: Medicare Other | Admitting: Oncology

## 2012-01-31 ENCOUNTER — Telehealth: Payer: Self-pay

## 2012-01-31 ENCOUNTER — Other Ambulatory Visit (HOSPITAL_BASED_OUTPATIENT_CLINIC_OR_DEPARTMENT_OTHER): Payer: Medicare Other | Admitting: Lab

## 2012-01-31 ENCOUNTER — Encounter: Payer: Self-pay | Admitting: Oncology

## 2012-01-31 VITALS — BP 122/59 | HR 86 | Temp 98.1°F | Ht 63.0 in | Wt 220.9 lb

## 2012-01-31 DIAGNOSIS — C9110 Chronic lymphocytic leukemia of B-cell type not having achieved remission: Secondary | ICD-10-CM

## 2012-01-31 DIAGNOSIS — D72819 Decreased white blood cell count, unspecified: Secondary | ICD-10-CM

## 2012-01-31 DIAGNOSIS — F79 Unspecified intellectual disabilities: Secondary | ICD-10-CM

## 2012-01-31 DIAGNOSIS — C9190 Lymphoid leukemia, unspecified not having achieved remission: Secondary | ICD-10-CM

## 2012-01-31 LAB — CBC WITH DIFFERENTIAL/PLATELET
BASO%: 0.2 % (ref 0.0–2.0)
Basophils Absolute: 0 10*3/uL (ref 0.0–0.1)
EOS%: 0.4 % (ref 0.0–7.0)
Eosinophils Absolute: 0.1 10*3/uL (ref 0.0–0.5)
HCT: 29.1 % — ABNORMAL LOW (ref 34.8–46.6)
HGB: 9.8 g/dL — ABNORMAL LOW (ref 11.6–15.9)
LYMPH%: 92.8 % — ABNORMAL HIGH (ref 14.0–49.7)
MCH: 29.6 pg (ref 25.1–34.0)
MCHC: 33.7 g/dL (ref 31.5–36.0)
MCV: 88 fL (ref 79.5–101.0)
MONO#: 0.4 10*3/uL (ref 0.1–0.9)
MONO%: 2.2 % (ref 0.0–14.0)
NEUT#: 0.8 10*3/uL — ABNORMAL LOW (ref 1.5–6.5)
NEUT%: 4.4 % — ABNORMAL LOW (ref 38.4–76.8)
Platelets: 54 10*3/uL — ABNORMAL LOW (ref 145–400)
RBC: 3.31 10*6/uL — ABNORMAL LOW (ref 3.70–5.45)
RDW: 17.6 % — ABNORMAL HIGH (ref 11.2–14.5)
WBC: 19 10*3/uL — ABNORMAL HIGH (ref 3.9–10.3)
lymph#: 17.6 10*3/uL — ABNORMAL HIGH (ref 0.9–3.3)
nRBC: 0 % (ref 0–0)

## 2012-01-31 LAB — COMPREHENSIVE METABOLIC PANEL
ALT: 10 U/L (ref 0–35)
AST: 13 U/L (ref 0–37)
Albumin: 3.9 g/dL (ref 3.5–5.2)
Alkaline Phosphatase: 80 U/L (ref 39–117)
BUN: 33 mg/dL — ABNORMAL HIGH (ref 6–23)
CO2: 30 mEq/L (ref 19–32)
Calcium: 9.2 mg/dL (ref 8.4–10.5)
Chloride: 100 mEq/L (ref 96–112)
Creatinine, Ser: 0.89 mg/dL (ref 0.50–1.10)
Glucose, Bld: 104 mg/dL — ABNORMAL HIGH (ref 70–99)
Potassium: 4.3 mEq/L (ref 3.5–5.3)
Sodium: 139 mEq/L (ref 135–145)
Total Bilirubin: 0.4 mg/dL (ref 0.3–1.2)
Total Protein: 5.7 g/dL — ABNORMAL LOW (ref 6.0–8.3)

## 2012-01-31 LAB — URIC ACID: Uric Acid, Serum: 8.8 mg/dL — ABNORMAL HIGH (ref 2.4–7.0)

## 2012-01-31 LAB — TECHNOLOGIST REVIEW

## 2012-01-31 MED ORDER — PEGFILGRASTIM INJECTION 6 MG/0.6ML
6.0000 mg | Freq: Once | SUBCUTANEOUS | Status: AC
  Administered 2012-01-31: 6 mg via SUBCUTANEOUS
  Filled 2012-01-31: qty 0.6

## 2012-01-31 NOTE — Telephone Encounter (Signed)
MS. Erin Moore RETURNING DR. Precious Reel PHONE CALL.

## 2012-01-31 NOTE — Telephone Encounter (Signed)
LM on home machine [phone]) for sister, to let her know labs etc. Asked her to call back by 1700 today if she wanted to speak to me, or tomorrow. Copies of labs given to caregiver for sister and all discussed with patient and caregiver now.

## 2012-01-31 NOTE — Progress Notes (Signed)
OFFICE PROGRESS NOTE Date of Visit 01-31-2012 Physicians: S.Wolters, L.Rennis Harding  INTERVAL HISTORY:  Patient is seen, together with caregiver, in continuing attention to her CLL, now having begun treatment with treanda/RItuxan, cycle 1 given 4-11 and 01-28-12. She has been on allopurinol at 200 mg daily since 4-12, previously on 100mg  daily for past year. She has been pushing po fluids and also received IVF at [facility] on 01-29-12. She has been feeling very well since the treatment, with good energy as she visited friends out of town over weekend, no nausea, voiding well, no fever or symptoms of infection, easy bruising but no bleeding, no pain, no shortness of breath.   CLL was diagnosed fall 2011 after she presented to primary MD with transient fatigue and had WBC ~ 13k. She had cervical lymph node biopsy in Sept 2011 and bone marrow exam Nov 2011 with cytogenetics done at [facility]. She did not have high risk cytogenetics. She did, however, have rapid doubling time and platelets decreaed to < 100k. She and sister were not interested in consideration of transplant then. She was treated with fludara/Rituxan x 6 cycles from Feb 2012 thru July 2012, which she tolerated generally well. Despite regular review of medications, we learned after treatment completed that she had not taken prophylactic TMP/SMX as instructed. She had partial response to the fludara/rituxan by labs and repeat US to evaluate spleen size. We have followed since July 2012 with observation. WBC had increased since November and platelets had not improved since treatment was completed. She was seen in consultation by Velta Addison at [facility] on 12-27-2011, with recommendation for bendamustine with rituxan with findings on bone marrow 01-13-2012 including 80 - 90 % cellularity with 89% lymphocytes and decreased megakaryocytes.  Review of Systems otherwise: no nausea or taste disturbance, good appetite. No fatigue today. No LE swelling. Bladder ok.  No hip pain now. Remainder of 10 point ROS negative/ unchanged.  Objective:  Vital signs in last 24 hours:  BP 122/59  Pulse 86  Temp(Src) 98.1 F (36.7 C) (Oral)  Ht 5\' 3"  (1.6 m)  Wt 220 lb 14.4 oz (100.2 kg)  BMI 39.13 kg/m2 Easily ambulatory, talkative, looks comfortable. Respirations not labored RA. Slightly diaphoretic.Weight is down 6 lbs.  HEENT:mucous membranes moist, pharynx normal without lesions No alopecia. PERRL, not icteric. LymphaticsCervical, supraclavicular, and axillary nodes normal. and No cervical adenopathy palpable now, no supraclavicular or axillary nodes appreciated. Cannot tell inguinal adenopathy with body habitus. Resp: clear to auscultation bilaterally and normal percussion bilaterally Cardio: regular rate and rhythm GI: obese, soft, nontender, cannot appreciate HSM. Some bowel sounds present and normally active. Extremities: extremities normal, atraumatic, no cyanosis or edema Neuro:CN, motor, sensory nonfocal Skin: some scattered ecchymoses on abdomen, no rash.  Lab Results:   Basename 01/31/12 1421  WBC 19.0*  HGB 9.8*  HCT 29.1*  PLT 54*  ANC 0.8 CBC re-run on another machine, same results Note CBC day 1 cycle 1 01-27-2012 WBC 181K, ANC 3.3, Hgb 9.8, plt 70K  BMET  Basename 01/31/12 1421  NA 139  K 4.3  CL 100  CO2 30  GLUCOSE 104*  BUN 33*  CREATININE 0.89  CALCIUM 9.2   Remainder of full STAT CMET resulted while patient waited, with LFTs normal, Tprot 5.7 and otherwise normal. Chemistries 4-3 had BUN 35/ creat .83 and from 4-11 BUN 24 and creat 0.96. Uric acid also resulted STAT while patient was at office, now 8.8 Studies/Results:  No results found.  Medications: I have reviewed  the patient's current medications. She and caregiver have been given written and oral instructions to increase allopurinol to 300 mg daily.   Assessment/Plan: 1.CLL : rapid drop in WBC which she seems to be tolerating at this point. She was given  neulasta today and they have been instructed in neutropenic precautions. Note nadir from treanda may be at week 3, such that she will need continued close attention. I will see her back at least 02-04-12 with CBC, STAT CMET and uric acid, or sooner if any problems. 2.HTN on medication 3.morbid obesity 4.some mental disability, with caregiver and with sister as HCPOA. I tried to reach sister by phone after labs resulted, and have LM for her. Information given to caregiver and patient to share with sister.  Charlcie Prisco P, MD   01/31/2012, 9:05 PM

## 2012-01-31 NOTE — Patient Instructions (Signed)
Neutropenic precautions as discussed. Call for temperature >=100.5 or symptoms of infection, or any bleeding. Drink lots of fluids.

## 2012-01-31 NOTE — Telephone Encounter (Deleted)
[name]  SCHEDULER WITH IR CALLED STATING THAT MS. Erin Moore IS SET UP FOR A PAC ON 02-14-12. HER PLTS. ARE 16K TODAY.  SHE MAY NEED PLTS. PRIOR TO THE PROCEDURE.

## 2012-02-02 ENCOUNTER — Other Ambulatory Visit: Payer: Self-pay

## 2012-02-02 ENCOUNTER — Telehealth: Payer: Self-pay

## 2012-02-02 DIAGNOSIS — C9190 Lymphoid leukemia, unspecified not having achieved remission: Secondary | ICD-10-CM

## 2012-02-02 DIAGNOSIS — C9110 Chronic lymphocytic leukemia of B-cell type not having achieved remission: Secondary | ICD-10-CM

## 2012-02-02 MED ORDER — ALLOPURINOL 300 MG PO TABS: 300.0000 mg | ORAL_TABLET | Freq: Every day | ORAL | Status: DC

## 2012-02-02 NOTE — Telephone Encounter (Signed)
Called pt's sister, Erin Moore, to check on pt, per MD request.  She states she just spoke with pt/caregiver, and pt is "doing great and feels good, has no complaints."  She states pt is staying out of the public like she is supposed to, even though she doesn't like that.  She states pt is eating and drinking well, has no rash, bleeding, nausea, increased fatigue, or other symptoms.  Informed her to call office if any problems arise, and she verbalizes understanding.  This information will be relayed to MD.

## 2012-02-02 NOTE — Telephone Encounter (Signed)
Received call from pt's sister, Bartholomew Crews, stating that pt has been taking allopurinol as directed, but will run out on Thursday.  [facility]s refill to [facility] on [address]  Called pharmacy [phone] and spoke with Deniece Portela, pharmacist, who states previous prescription was written by Dr. Mila Palmer for allopurinol 100 mg.  He states if pt is to take 300 mg total, then the 300 mg tab would be best.  Per Dr. Darrold Span, call in new prescription for allopurinol 300 mg 1 po daily #30 1 RF and inform pt's sister.  Called [facility] and asked Deniece Portela to re-iterate that pt is take 1 TAB ONLY daily, NOT 3.  Called pt's sister and informed her of this as well, and she verbalizes understanding.

## 2012-02-04 ENCOUNTER — Telehealth: Payer: Self-pay | Admitting: Oncology

## 2012-02-04 ENCOUNTER — Ambulatory Visit (HOSPITAL_BASED_OUTPATIENT_CLINIC_OR_DEPARTMENT_OTHER): Payer: Medicare Other | Admitting: Oncology

## 2012-02-04 ENCOUNTER — Encounter: Payer: Self-pay | Admitting: Oncology

## 2012-02-04 ENCOUNTER — Other Ambulatory Visit (HOSPITAL_BASED_OUTPATIENT_CLINIC_OR_DEPARTMENT_OTHER): Payer: Medicare Other

## 2012-02-04 VITALS — BP 107/60 | HR 84 | Temp 98.7°F | Ht 63.0 in | Wt 220.5 lb

## 2012-02-04 DIAGNOSIS — F79 Unspecified intellectual disabilities: Secondary | ICD-10-CM

## 2012-02-04 DIAGNOSIS — C9110 Chronic lymphocytic leukemia of B-cell type not having achieved remission: Secondary | ICD-10-CM

## 2012-02-04 DIAGNOSIS — C9190 Lymphoid leukemia, unspecified not having achieved remission: Secondary | ICD-10-CM

## 2012-02-04 DIAGNOSIS — I10 Essential (primary) hypertension: Secondary | ICD-10-CM

## 2012-02-04 LAB — CBC WITH DIFFERENTIAL/PLATELET
BASO%: 0.3 % (ref 0.0–2.0)
Basophils Absolute: 0 10*3/uL (ref 0.0–0.1)
EOS%: 0.4 % (ref 0.0–7.0)
Eosinophils Absolute: 0 10*3/uL (ref 0.0–0.5)
HCT: 29.6 % — ABNORMAL LOW (ref 34.8–46.6)
HGB: 9.8 g/dL — ABNORMAL LOW (ref 11.6–15.9)
LYMPH%: 36.5 % (ref 14.0–49.7)
MCH: 29.4 pg (ref 25.1–34.0)
MCHC: 33.1 g/dL (ref 31.5–36.0)
MCV: 89 fL (ref 79.5–101.0)
MONO#: 0.9 10*3/uL (ref 0.1–0.9)
MONO%: 8.9 % (ref 0.0–14.0)
NEUT#: 5.3 10*3/uL (ref 1.5–6.5)
NEUT%: 53.9 % (ref 38.4–76.8)
Platelets: 76 10*3/uL — ABNORMAL LOW (ref 145–400)
RBC: 3.33 10*6/uL — ABNORMAL LOW (ref 3.70–5.45)
RDW: 17.6 % — ABNORMAL HIGH (ref 11.2–14.5)
WBC: 9.9 10*3/uL (ref 3.9–10.3)
lymph#: 3.6 10*3/uL — ABNORMAL HIGH (ref 0.9–3.3)
nRBC: 0 % (ref 0–0)

## 2012-02-04 LAB — COMPREHENSIVE METABOLIC PANEL
ALT: 9 U/L (ref 0–35)
AST: 15 U/L (ref 0–37)
Albumin: 3.8 g/dL (ref 3.5–5.2)
Alkaline Phosphatase: 92 U/L (ref 39–117)
BUN: 25 mg/dL — ABNORMAL HIGH (ref 6–23)
CO2: 30 mEq/L (ref 19–32)
Calcium: 9.2 mg/dL (ref 8.4–10.5)
Chloride: 101 mEq/L (ref 96–112)
Creatinine, Ser: 0.91 mg/dL (ref 0.50–1.10)
Glucose, Bld: 97 mg/dL (ref 70–99)
Potassium: 4.2 mEq/L (ref 3.5–5.3)
Sodium: 138 mEq/L (ref 135–145)
Total Bilirubin: 0.3 mg/dL (ref 0.3–1.2)
Total Protein: 6 g/dL (ref 6.0–8.3)

## 2012-02-04 LAB — URIC ACID: Uric Acid, Serum: 6.3 mg/dL (ref 2.4–7.0)

## 2012-02-04 NOTE — Telephone Encounter (Signed)
gv pt appt schedule for April thru June. °

## 2012-02-04 NOTE — Patient Instructions (Signed)
Call if any problems before visit next week.

## 2012-02-04 NOTE — Progress Notes (Signed)
OFFICE PROGRESS NOTE Date of Visit 02-04-2012 Physicians:S.Wolters, L.Rennis Harding  INTERVAL HISTORY:  Patient is seen, together with caregiver, in continuing close attention to her CLL, post first cycle of treanda/rituxan given 4-11 and 01-28-2012. She had neulasta 01-31-2012 as she was already neutropenic by that time. She has tolerated this treatment remarkably well, with rapid marked drop in WBC fortunately without tumor lysis syndrome. She continues on allopurinol at 300mg  daily, has had no skin rash, no nausea, no fatigue, no fever or symptoms of infection. She has been drinking fluids well, mostly water.She is voiding without difficulty. She has had no bleeding and bruises seem improved. She did not have significant neulasta aches.  CLL was diagnosed fall 2011 after she presented to primary MD with transient fatigue and had WBC ~ 13k. She had cervical lymph node biopsy in Sept 2011 and bone marrow exam Nov 2011 with cytogenetics done at [facility]. She did not have high risk cytogenetics. She did, however, have rapid doubling time and platelets decreaed to < 100k. She and sister were not interested in consideration of transplant then. She was treated with fludara/Rituxan x 6 cycles from Feb 2012 thru July 2012, which she tolerated generally well. Despite regular review of medications, we learned after treatment completed that she had not taken prophylactic TMP/SMX as instructed. She had partial response to the fludara/rituxan by labs and repeat US to evaluate spleen size. We have followed since July 2012 with observation. WBC had increased since November and platelets had not improved since treatment was completed. She was seen in consultation by Velta Addison at [facility] on 12-27-2011, with recommendation for bendamustine with rituxan with findings on bone marrow 01-13-2012 including 80 - 90 % cellularity with 89% lymphocytes and decreased megakaryocytes  Full 10 point review of systems otherwise negative/  unchanged. Objective:  Vital signs in last 24 hours:  BP 107/60  Pulse 84  Temp(Src) 98.7 F (37.1 C) (Oral)  Ht 5\' 3"  (1.6 m)  Wt 220 lb 8 oz (100.018 kg)  BMI 39.06 kg/m2 Weight is stable. Ambulatory, looks comfortable, talkative.  HEENT:mucous membranes moist, pharynx normal without lesions. PERRL.  LymphaticsCervical, supraclavicular, and axillary nodes normal. and cervical adenopathy resolved since recent treatment Resp: clear to auscultation bilaterally and normal percussion bilaterally Cardio: regular rate and rhythm GI: soft, non-tender; bowel sounds normal; no masses,  no organomegaly. Obese, nothing palpable. Extremities: extremities normal, atraumatic, no cyanosis or edema Neuro:nonfocal No central catheter. Skin with few resolving bruises lateral abdomen, no rash, no problems at IV access sites UE.  Lab Results:   Basename 02/04/12 1329  WBC 9.9  HGB 9.8*  HCT 29.6*  PLT 76*   HGB is stable. WBC in down from 181k on 01-27-12 and 19k on 01-31-12. ANC is up from 0.8 on 4-15. Plt are up from 54k on 4-15 BMET  Basename 02/04/12 1329  NA 138  K 4.2  CL 101  CO2 30  GLUCOSE 97  BUN 25*  CREATININE 0.91  CALCIUM 9.2   Remainder of full CMET normal today.  Uric acid down to 6.3, this + chemistries sent stat and resulted while patient was being seen. Studies/Results:  No results found.  Medications: I have reviewed the patient's current medications. She will continue allopurinol at 300 mg daily, now being assisted with meds by caregiver.  Assessment/Plan:  1. CLL: rapid doubling time initially, then progression after fludara/rituxan including cytopenias related to bone marrow involvement with CLL. Has had prompt, marked drop WBC now day 9 cycle  1 treanda  /rituxan (+neulasta). Nadir for treanda expected week 3, or ~ 02-17-12. She will see midlevel with counts and chemistries at least 02-11-12 and I will see her again at least 02-16-12. She can do more activities  for next few days as she is no longer neutropenic. She will be due cycle 2 5-9 and 02-25-12. 2.No tumor lysis syndrome and uric acid improved 3.morbid obesity 4.HTN 5.some mental disability, sister is HCPOA and caregiver now assisting I have given copies of labs from today to them for sister.  Erin Moore P, MD   02/04/2012, 9:12 PM

## 2012-02-11 ENCOUNTER — Other Ambulatory Visit (HOSPITAL_BASED_OUTPATIENT_CLINIC_OR_DEPARTMENT_OTHER): Payer: Medicare Other | Admitting: Lab

## 2012-02-11 ENCOUNTER — Ambulatory Visit (HOSPITAL_BASED_OUTPATIENT_CLINIC_OR_DEPARTMENT_OTHER): Payer: Medicare Other | Admitting: Physician Assistant

## 2012-02-11 ENCOUNTER — Encounter: Payer: Self-pay | Admitting: Physician Assistant

## 2012-02-11 VITALS — BP 123/72 | HR 77 | Temp 97.8°F | Ht 63.0 in | Wt 220.9 lb

## 2012-02-11 DIAGNOSIS — C9190 Lymphoid leukemia, unspecified not having achieved remission: Secondary | ICD-10-CM

## 2012-02-11 DIAGNOSIS — E669 Obesity, unspecified: Secondary | ICD-10-CM

## 2012-02-11 DIAGNOSIS — C9110 Chronic lymphocytic leukemia of B-cell type not having achieved remission: Secondary | ICD-10-CM

## 2012-02-11 DIAGNOSIS — I10 Essential (primary) hypertension: Secondary | ICD-10-CM

## 2012-02-11 LAB — BASIC METABOLIC PANEL
BUN: 25 mg/dL — ABNORMAL HIGH (ref 6–23)
CO2: 32 mEq/L (ref 19–32)
Calcium: 9 mg/dL (ref 8.4–10.5)
Chloride: 99 mEq/L (ref 96–112)
Creatinine, Ser: 0.89 mg/dL (ref 0.50–1.10)
Glucose, Bld: 93 mg/dL (ref 70–99)
Potassium: 4.3 mEq/L (ref 3.5–5.3)
Sodium: 137 mEq/L (ref 135–145)

## 2012-02-11 LAB — CBC WITH DIFFERENTIAL/PLATELET
BASO%: 0.2 % (ref 0.0–2.0)
Basophils Absolute: 0 10*3/uL (ref 0.0–0.1)
EOS%: 0.6 % (ref 0.0–7.0)
Eosinophils Absolute: 0.1 10*3/uL (ref 0.0–0.5)
HCT: 29.8 % — ABNORMAL LOW (ref 34.8–46.6)
HGB: 9.7 g/dL — ABNORMAL LOW (ref 11.6–15.9)
LYMPH%: 39.7 % (ref 14.0–49.7)
MCH: 28.9 pg (ref 25.1–34.0)
MCHC: 32.6 g/dL (ref 31.5–36.0)
MCV: 88.7 fL (ref 79.5–101.0)
MONO#: 0.8 10*3/uL (ref 0.1–0.9)
MONO%: 8.5 % (ref 0.0–14.0)
NEUT#: 4.5 10*3/uL (ref 1.5–6.5)
NEUT%: 51 % (ref 38.4–76.8)
Platelets: 70 10*3/uL — ABNORMAL LOW (ref 145–400)
RBC: 3.36 10*6/uL — ABNORMAL LOW (ref 3.70–5.45)
RDW: 16.4 % — ABNORMAL HIGH (ref 11.2–14.5)
WBC: 8.8 10*3/uL (ref 3.9–10.3)
lymph#: 3.5 10*3/uL — ABNORMAL HIGH (ref 0.9–3.3)

## 2012-02-11 LAB — TECHNOLOGIST REVIEW

## 2012-02-11 LAB — URIC ACID: Uric Acid, Serum: 5.1 mg/dL (ref 2.4–7.0)

## 2012-02-11 NOTE — Progress Notes (Signed)
OFFICE PROGRESS NOTE Date of Visit 02-04-2012 Physicians:S.Wolters, L.Rennis Harding  INTERVAL HISTORY:  Patient is seen, together with caregiver, in continuing close attention to her CLL, post first cycle of treanda/rituxan given 4-11 and 01-28-2012. She had neulasta 01-31-2012 as she was already neutropenic by that time. Overall she has tolerated treatment without difficulty. She presents today for a symptom management visit. She reports she feels fine and voices no complaints. She states she is scheduled for a dental cleaning next week and is wondering if she should keep this appointment. She continues on allopurinol at 300mg  daily She denied skin rash, active bleeding or new bruising, fatigue ( in fact she continues to exercise 3-4 days per week), nausea, fever, chills or symptoms of infection. Her appetite is good and she is sleeping well.   CLL was diagnosed fall 2011 after she presented to primary MD with transient fatigue and had WBC ~ 13k. She had cervical lymph node biopsy in Sept 2011 and bone marrow exam Nov 2011 with cytogenetics done at [facility]. She did not have high risk cytogenetics. She did, however, have rapid doubling time and platelets decreaed to < 100k. She and sister were not interested in consideration of transplant then. She was treated with fludara/Rituxan x 6 cycles from Feb 2012 thru July 2012, which she tolerated generally well. Despite regular review of medications, we learned after treatment completed that she had not taken prophylactic TMP/SMX as instructed. She had partial response to the fludara/rituxan by labs and repeat US to evaluate spleen size. We have followed since July 2012 with observation. WBC had increased since November and platelets had not improved since treatment was completed. She was seen in consultation by Velta Addison at [facility] on 12-27-2011, with recommendation for bendamustine with rituxan with findings on bone marrow 01-13-2012 including 80 - 90 % cellularity  with 89% lymphocytes and decreased megakaryocytes  Full 10 point review of systems is negative  Objective:  Vital signs in last 24 hours:  BP 123/72  Pulse 77  Temp(Src) 97.8 F (36.6 C) (Oral)  Ht 5\' 3"  (1.6 m)  Wt 220 lb 14.4 oz (100.2 kg)  BMI 39.13 kg/m2 Weight is stable. Ambulatory, looks comfortable, talkative.  HEENT:mucous membranes moist, pharynx normal without lesions. PERRL.  LymphaticsCervical, supraclavicular, and axillary nodes normal. and cervical adenopathy resolved since recent treatment Resp: clear to auscultation bilaterally and normal percussion bilaterally Cardio: regular rate and rhythm GI: soft, non-tender; bowel sounds normal; no masses,  no organomegaly. Obese, nothing palpable. Extremities: extremities normal, atraumatic, no cyanosis or edema Neuro:, alert, oriented x 3, non-focal, grossly intact No central catheter. Skin with few resolving bruises lateral abdomen, no rash, no problems at IV access sites UE.  Lab Results:   Basename 02/11/12 1110  WBC 8.8  HGB 9.7*  HCT 29.8*  PLT 70*   HGB is stable. WBC in down from 181k on 01-27-12 and 19k on 01-31-12. ANC is up from 0.8 on 4-15. Plt are up from 54k on 4-15 BMET  Basename 02/11/12 1110  NA 137  K 4.3  CL 99  CO2 32  GLUCOSE 93  BUN 25*  CREATININE 0.89  CALCIUM 9.0   Remainder of full CMET normal today.  Uric acid down to 5.1, chemistries sent stat and resulted while patient was being seen. Studies/Results:  No results found.  Medications: I have reviewed the patient's current medications. She will continue allopurinol at 300 mg daily, now being assisted with meds by caregiver.  Assessment/Plan:  1. CLL: rapid  doubling time initially, then progression after fludara/rituxan including cytopenias related to bone marrow involvement with CLL. Has had prompt, marked drop WBC now day 9 cycle 1 treanda  /rituxan (+neulasta). Nadir for treanda expected week 3, or ~ 02-17-12. Fortunately with  the rapid normalization in her counts she has not experienced any tumor lysis syndrome. The patient was discussed with Dr. Darrold Span. She will follow-up with Dr. Darrold Span as scheduled on 02/16/2012. She was asked to postpone her dental cleaning appointment for now. She voiced understanding.  She will be due cycle 2 on 02-24-12 and 02-25-12. 2.No tumor lysis syndrome and uric acid continues to improve. 3.morbid obesity 4.HTN 5.some mental disability, sister is HCPOA and caregiver now assisting I have given copies of labs from today to them for sister.  Conni Slipper, PA-C   02/11/2012, 12:59 PM

## 2012-02-16 ENCOUNTER — Encounter: Payer: Self-pay | Admitting: Oncology

## 2012-02-16 ENCOUNTER — Ambulatory Visit (HOSPITAL_BASED_OUTPATIENT_CLINIC_OR_DEPARTMENT_OTHER): Payer: Medicare Other | Admitting: Oncology

## 2012-02-16 ENCOUNTER — Other Ambulatory Visit (HOSPITAL_BASED_OUTPATIENT_CLINIC_OR_DEPARTMENT_OTHER): Payer: Medicare Other | Admitting: Lab

## 2012-02-16 ENCOUNTER — Telehealth: Payer: Self-pay | Admitting: Oncology

## 2012-02-16 VITALS — BP 120/56 | HR 68 | Temp 97.3°F | Ht 63.0 in | Wt 223.5 lb

## 2012-02-16 DIAGNOSIS — C9110 Chronic lymphocytic leukemia of B-cell type not having achieved remission: Secondary | ICD-10-CM

## 2012-02-16 DIAGNOSIS — C9190 Lymphoid leukemia, unspecified not having achieved remission: Secondary | ICD-10-CM

## 2012-02-16 DIAGNOSIS — I10 Essential (primary) hypertension: Secondary | ICD-10-CM

## 2012-02-16 LAB — CBC WITH DIFFERENTIAL/PLATELET
BASO%: 0.8 % (ref 0.0–2.0)
Basophils Absolute: 0.1 10*3/uL (ref 0.0–0.1)
EOS%: 0.7 % (ref 0.0–7.0)
Eosinophils Absolute: 0.1 10*3/uL (ref 0.0–0.5)
HCT: 31.2 % — ABNORMAL LOW (ref 34.8–46.6)
HGB: 10.2 g/dL — ABNORMAL LOW (ref 11.6–15.9)
LYMPH%: 55.1 % — ABNORMAL HIGH (ref 14.0–49.7)
MCH: 29.4 pg (ref 25.1–34.0)
MCHC: 32.8 g/dL (ref 31.5–36.0)
MCV: 89.8 fL (ref 79.5–101.0)
MONO#: 0.5 10*3/uL (ref 0.1–0.9)
MONO%: 4.5 % (ref 0.0–14.0)
NEUT#: 3.9 10*3/uL (ref 1.5–6.5)
NEUT%: 38.9 % (ref 38.4–76.8)
Platelets: 95 10*3/uL — ABNORMAL LOW (ref 145–400)
RBC: 3.48 10*6/uL — ABNORMAL LOW (ref 3.70–5.45)
RDW: 17.8 % — ABNORMAL HIGH (ref 11.2–14.5)
WBC: 10.1 10*3/uL (ref 3.9–10.3)
lymph#: 5.6 10*3/uL — ABNORMAL HIGH (ref 0.9–3.3)
nRBC: 0 % (ref 0–0)

## 2012-02-16 LAB — COMPREHENSIVE METABOLIC PANEL
ALT: 15 U/L (ref 0–35)
AST: 22 U/L (ref 0–37)
Albumin: 4 g/dL (ref 3.5–5.2)
Alkaline Phosphatase: 89 U/L (ref 39–117)
BUN: 20 mg/dL (ref 6–23)
CO2: 32 mEq/L (ref 19–32)
Calcium: 9 mg/dL (ref 8.4–10.5)
Chloride: 100 mEq/L (ref 96–112)
Creatinine, Ser: 0.92 mg/dL (ref 0.50–1.10)
Glucose, Bld: 92 mg/dL (ref 70–99)
Potassium: 4.4 mEq/L (ref 3.5–5.3)
Sodium: 137 mEq/L (ref 135–145)
Total Bilirubin: 0.4 mg/dL (ref 0.3–1.2)
Total Protein: 6.1 g/dL (ref 6.0–8.3)

## 2012-02-16 LAB — TECHNOLOGIST REVIEW

## 2012-02-16 LAB — LACTATE DEHYDROGENASE: LDH: 160 U/L (ref 94–250)

## 2012-02-16 LAB — URIC ACID: Uric Acid, Serum: 4.8 mg/dL (ref 2.4–7.0)

## 2012-02-16 NOTE — Progress Notes (Signed)
OFFICE PROGRESS NOTE Date of Visit 02-16-2012 Physicians S.Wolters, L.Rennis Harding  INTERVAL HISTORY:  Patient is seen, together with caregiver, in continuing attention to her CLL, having had an excellent response to first cycle of Treanda/rituxan given 4-11 and 01-28-12. She was already neutropenic by day 5, with neulasta given then and fortunately came quickly out of neutropenic range and had no infectious complications. Despite rapid, marked drop in WBC (from 181K on day 1 to 19k on day 5), she had no tumor lysis complications. She has been continued on allopurinol and has had no skin problems from [place].  CLL was diagnosed fall 2011 after she presented to primary MD with transient fatigue and had WBC ~ 13k. She had cervical lymph node biopsy in Sept 2011 and bone marrow exam Nov 2011 with cytogenetics done at [facility]. She did not have high risk cytogenetics. She did, however, have rapid doubling time and platelets decreaed to < 100k. She and sister were not interested in consideration of transplant then. She was treated with fludara/Rituxan x 6 cycles from Feb 2012 thru July 2012, which she tolerated generally well. Despite regular review of medications, we learned after treatment completed that she had not taken prophylactic TMP/SMX as instructed. She had partial response to the fludara/rituxan by labs and repeat US to evaluate spleen size. We have followed since July 2012 with observation. WBC had increased since November and platelets had not improved since treatment was completed. She was seen in consultation by Velta Addison at [facility] on 12-27-2011, with recommendation for bendamustine with rituxan with findings on bone marrow 01-13-2012 including 80 - 90 % cellularity with 89% lymphocytes and decreased megakaryocytes  Patient has been feeling very well since she was here last, with excellent energy and no complaints. She has been exercising at gym without shortness of breath now. She denies pain,  has had no bleeding or further bruising, no nausea, no problems with bowels. Remainder of 10 point Review of Systems negative. Objective:  Vital signs in last 24 hours:  BP 120/56  Pulse 68  Temp 97.3 F (36.3 C)  Ht 5\' 3"  (1.6 m)  Wt 223 lb 8 oz (101.379 kg)  BMI 39.59 kg/m2 Weight is up 3 lbs. Easily ambulatory, looks comfortable, not diaphoretic. HEENT:mucous membranes moist, pharynx normal without lesionsNo alopecia LymphaticsCervical, supraclavicular, and axillary nodes normal. and no palpable cervical or supraclavicular adenopathy now Resp: clear to auscultation bilaterally and normal percussion bilaterally Cardio: regular rate and rhythm, clear heart sounds GI: obese, nothing palpable. Nontender, bowel sounds normally active Extremities: extremities normal, atraumatic, no cyanosis or edema Neuro:no focal deficits No Portacath Skin without rash or ecchymoses Lab Results:   Basename 02/16/12 0939  WBC 10.1  HGB 10.2*  HCT 31.2*  PLT 95*  platelets are highest they have been in past year. ANC 3.9 lymphs 55% BMET  Basename 02/16/12 0939  NA 137  K 4.4  CL 100  CO2 32  GLUCOSE 92  BUN 20  CREATININE 0.92  CALCIUM 9.0  remainder of full CMET normal. LDH down to 160 from 241 prior to cycle 1 Uric acid today 4.8 Studies/Results:  No results found.  Medications: I have reviewed the patient's current medications. She will hold all medications day 1 cycle 2 and will bring antihypertensives to treatment on day 2, to take if appropriate after BP checked that day. She will hold allopurinol days of treatment and resume day after chemo. She will have neulasta on day 3, but will not need IVF that  day. Assessment/Plan:  1.CLL: history as above, responding well to treanda/rituxan already and tolerating this also well. Cycle 2 to be given 5-9 and 02-25-12 with neulasta 5-11. I will see her week after Rx with cbc, stat chemistries and uric acid. 2.problems with nausea and  hypotension with rituxan previously such that rate of infusion is not increased to maximum. 3.morbid obesity 4.HTN 5.some cognitive disability. Sister is HCPOA; caregiver has been assisting with visits recently.   Ranbir Chew P, MD   02/16/2012, 11:29 AM

## 2012-02-16 NOTE — Patient Instructions (Addendum)
On the long first chemo day, do not take blood pressure medicines, allopurinol, or supplements.  On the shorter second day, you can take blood pressure medicines (or bring them with you so you can take them after we check your blood pressure here) but do not take allopurinol.  Resume allopurinol the day after chemo. Call if any rash after chemo

## 2012-02-16 NOTE — Telephone Encounter (Signed)
Gv pt appt for [id number] 

## 2012-02-17 NOTE — Telephone Encounter (Signed)
No other notes.   

## 2012-02-18 ENCOUNTER — Telehealth: Payer: Self-pay | Admitting: Oncology

## 2012-02-18 NOTE — Telephone Encounter (Signed)
Called pt , left message , regarding appt on 5/9 and advised pt to get new calendar on 02/24/12

## 2012-02-20 ENCOUNTER — Other Ambulatory Visit: Payer: Self-pay | Admitting: Oncology

## 2012-02-24 ENCOUNTER — Other Ambulatory Visit: Payer: Self-pay

## 2012-02-24 ENCOUNTER — Ambulatory Visit (HOSPITAL_BASED_OUTPATIENT_CLINIC_OR_DEPARTMENT_OTHER): Payer: Medicare Other

## 2012-02-24 ENCOUNTER — Other Ambulatory Visit (HOSPITAL_BASED_OUTPATIENT_CLINIC_OR_DEPARTMENT_OTHER): Payer: Medicare Other | Admitting: Lab

## 2012-02-24 VITALS — BP 130/84 | HR 96 | Temp 98.1°F

## 2012-02-24 DIAGNOSIS — C9190 Lymphoid leukemia, unspecified not having achieved remission: Secondary | ICD-10-CM

## 2012-02-24 DIAGNOSIS — C9110 Chronic lymphocytic leukemia of B-cell type not having achieved remission: Secondary | ICD-10-CM

## 2012-02-24 DIAGNOSIS — Z5111 Encounter for antineoplastic chemotherapy: Secondary | ICD-10-CM

## 2012-02-24 DIAGNOSIS — C801 Malignant (primary) neoplasm, unspecified: Secondary | ICD-10-CM

## 2012-02-24 LAB — CBC WITH DIFFERENTIAL/PLATELET
BASO%: 0.4 % (ref 0.0–2.0)
Basophils Absolute: 0.1 10*3/uL (ref 0.0–0.1)
EOS%: 0.9 % (ref 0.0–7.0)
Eosinophils Absolute: 0.1 10*3/uL (ref 0.0–0.5)
HCT: 32 % — ABNORMAL LOW (ref 34.8–46.6)
HGB: 10.5 g/dL — ABNORMAL LOW (ref 11.6–15.9)
LYMPH%: 61.9 % — ABNORMAL HIGH (ref 14.0–49.7)
MCH: 29 pg (ref 25.1–34.0)
MCHC: 32.8 g/dL (ref 31.5–36.0)
MCV: 88.4 fL (ref 79.5–101.0)
MONO#: 1.2 10*3/uL — ABNORMAL HIGH (ref 0.1–0.9)
MONO%: 9.7 % (ref 0.0–14.0)
NEUT#: 3.3 10*3/uL (ref 1.5–6.5)
NEUT%: 27.1 % — ABNORMAL LOW (ref 38.4–76.8)
Platelets: 105 10*3/uL — ABNORMAL LOW (ref 145–400)
RBC: 3.62 10*6/uL — ABNORMAL LOW (ref 3.70–5.45)
RDW: 16.3 % — ABNORMAL HIGH (ref 11.2–14.5)
WBC: 12.1 10*3/uL — ABNORMAL HIGH (ref 3.9–10.3)
lymph#: 7.5 10*3/uL — ABNORMAL HIGH (ref 0.9–3.3)
nRBC: 0 % (ref 0–0)

## 2012-02-24 MED ORDER — SODIUM CHLORIDE 0.9 % IV SOLN: Freq: Once | INTRAVENOUS | Status: DC

## 2012-02-24 MED ORDER — DEXAMETHASONE SODIUM PHOSPHATE 10 MG/ML IJ SOLN
10.0000 mg | Freq: Once | INTRAMUSCULAR | Status: AC
  Administered 2012-02-24: 10 mg via INTRAVENOUS

## 2012-02-24 MED ORDER — LORAZEPAM 1 MG PO TABS: 0.5000 mg | ORAL_TABLET | ORAL | Status: DC

## 2012-02-24 MED ORDER — ONDANSETRON 16 MG/50ML IVPB (CHCC)
16.0000 mg | Freq: Once | INTRAVENOUS | Status: AC
  Administered 2012-02-24: 16 mg via INTRAVENOUS

## 2012-02-24 MED ORDER — ACETAMINOPHEN 325 MG PO TABS: 650.0000 mg | ORAL_TABLET | Freq: Once | ORAL | Status: DC

## 2012-02-24 MED ORDER — SODIUM CHLORIDE 0.9 % IV SOLN
375.0000 mg/m2 | Freq: Once | INTRAVENOUS | Status: AC
  Administered 2012-02-24: 800 mg via INTRAVENOUS
  Filled 2012-02-24: qty 80

## 2012-02-24 MED ORDER — BENDAMUSTINE HCL (LYOPHILIZED PWD) CHEMO INJECTION 100MG
70.0000 mg/m2 | Freq: Once | INTRAVENOUS | Status: AC
  Administered 2012-02-24: 150 mg via INTRAVENOUS
  Filled 2012-02-24: qty 30

## 2012-02-24 MED ORDER — LORAZEPAM 2 MG/ML IJ SOLN
0.5000 mg | Freq: Once | INTRAMUSCULAR | Status: AC
  Administered 2012-02-24: 0.5 mg via INTRAVENOUS

## 2012-02-24 MED ORDER — DIPHENHYDRAMINE HCL 25 MG PO CAPS: 50.0000 mg | ORAL_CAPSULE | Freq: Once | ORAL | Status: DC

## 2012-02-24 NOTE — Patient Instructions (Signed)
[facility] Discharge Instructions for Patients Receiving Chemotherapy  Today you received the following chemotherapy agents trenda/rituxan  To help prevent nausea and vomiting after your treatment, we encourage you to take your nausea medication and take it as often as prescribed for the next If you develop nausea and vomiting that is not controlled by your nausea medication, call the clinic. If it is after clinic hours your family physician or the after hours number for the clinic or go to the Emergency Department.   BELOW ARE SYMPTOMS THAT SHOULD BE REPORTED IMMEDIATELY:  *FEVER GREATER THAN 100.5 F  *CHILLS WITH OR WITHOUT FEVER  NAUSEA AND VOMITING THAT IS NOT CONTROLLED WITH YOUR NAUSEA MEDICATION  *UNUSUAL SHORTNESS OF BREATH  *UNUSUAL BRUISING OR BLEEDING  TENDERNESS IN MOUTH AND THROAT WITH OR WITHOUT PRESENCE OF ULCERS  *URINARY PROBLEMS  *BOWEL PROBLEMS  UNUSUAL RASH Items with * indicate a potential emergency and should be followed up as soon as possible.  One of the nurses will contact you 24 hours after your treatment. Please let the nurse know about any problems that you may have experienced. Feel free to call the clinic you have any questions or concerns. The clinic phone number is [phone].   I have been informed and understand all the instructions given to me. I know to contact the clinic, my physician, or go to the Emergency Department if any problems should occur. I do not have any questions at this time, but understand that I may call the clinic during office hours   should I have any questions or need assistance in obtaining follow up care.    __________________________________________  _____________  __________ Signature of Patient or Authorized Representative            Date                   Time    __________________________________________ Nurse's Signature

## 2012-02-25 ENCOUNTER — Ambulatory Visit (HOSPITAL_BASED_OUTPATIENT_CLINIC_OR_DEPARTMENT_OTHER): Payer: Medicare Other

## 2012-02-25 VITALS — BP 146/75 | HR 86 | Temp 98.3°F

## 2012-02-25 DIAGNOSIS — C9190 Lymphoid leukemia, unspecified not having achieved remission: Secondary | ICD-10-CM

## 2012-02-25 DIAGNOSIS — C9110 Chronic lymphocytic leukemia of B-cell type not having achieved remission: Secondary | ICD-10-CM

## 2012-02-25 DIAGNOSIS — Z5111 Encounter for antineoplastic chemotherapy: Secondary | ICD-10-CM

## 2012-02-25 MED ORDER — SODIUM CHLORIDE 0.9 % IV SOLN
70.0000 mg/m2 | Freq: Once | INTRAVENOUS | Status: AC
  Administered 2012-02-25: 150 mg via INTRAVENOUS
  Filled 2012-02-25: qty 30

## 2012-02-25 MED ORDER — SODIUM CHLORIDE 0.9 % IV SOLN: Freq: Once | INTRAVENOUS | Status: DC

## 2012-02-25 MED ORDER — ONDANSETRON 16 MG/50ML IVPB (CHCC)
16.0000 mg | Freq: Once | INTRAVENOUS | Status: AC
  Administered 2012-02-25: 16 mg via INTRAVENOUS

## 2012-02-25 MED ORDER — DEXAMETHASONE SODIUM PHOSPHATE 10 MG/ML IJ SOLN
10.0000 mg | Freq: Once | INTRAMUSCULAR | Status: AC
  Administered 2012-02-25: 10 mg via INTRAVENOUS

## 2012-02-25 MED ORDER — LORAZEPAM 1 MG PO TABS: 0.5000 mg | ORAL_TABLET | Freq: Once | ORAL | Status: DC

## 2012-02-25 NOTE — Patient Instructions (Signed)
[facility] Discharge Instructions for Patients Receiving Chemotherapy  Today you received the following chemotherapy agents trenda  To help prevent nausea and vomiting after your treatment, we encourage you to take your nausea medication and take it as often as prescribed   If you develop nausea and vomiting that is not controlled by your nausea medication, call the clinic. If it is after clinic hours your family physician or the after hours number for the clinic or go to the Emergency Department.   BELOW ARE SYMPTOMS THAT SHOULD BE REPORTED IMMEDIATELY:  *FEVER GREATER THAN 100.5 F  *CHILLS WITH OR WITHOUT FEVER  NAUSEA AND VOMITING THAT IS NOT CONTROLLED WITH YOUR NAUSEA MEDICATION  *UNUSUAL SHORTNESS OF BREATH  *UNUSUAL BRUISING OR BLEEDING  TENDERNESS IN MOUTH AND THROAT WITH OR WITHOUT PRESENCE OF ULCERS  *URINARY PROBLEMS  *BOWEL PROBLEMS  UNUSUAL RASH Items with * indicate a potential emergency and should be followed up as soon as possible.  One of the nurses will contact you 24 hours after your treatment. Please let the nurse know about any problems that you may have experienced. Feel free to call the clinic you have any questions or concerns. The clinic phone number is [phone].   I have been informed and understand all the instructions given to me. I know to contact the clinic, my physician, or go to the Emergency Department if any problems should occur. I do not have any questions at this time, but understand that I may call the clinic during office hours   should I have any questions or need assistance in obtaining follow up care.    __________________________________________  _____________  __________ Signature of Patient or Authorized Representative            Date                   Time    __________________________________________ Nurse's Signature

## 2012-02-26 ENCOUNTER — Ambulatory Visit (HOSPITAL_BASED_OUTPATIENT_CLINIC_OR_DEPARTMENT_OTHER): Payer: Medicare Other

## 2012-02-26 VITALS — BP 134/65 | HR 61 | Temp 97.0°F

## 2012-02-26 DIAGNOSIS — C9110 Chronic lymphocytic leukemia of B-cell type not having achieved remission: Secondary | ICD-10-CM

## 2012-02-26 DIAGNOSIS — C9190 Lymphoid leukemia, unspecified not having achieved remission: Secondary | ICD-10-CM

## 2012-02-26 MED ORDER — PEGFILGRASTIM INJECTION 6 MG/0.6ML
6.0000 mg | Freq: Once | SUBCUTANEOUS | Status: AC
  Administered 2012-02-26: 6 mg via SUBCUTANEOUS

## 2012-02-29 ENCOUNTER — Other Ambulatory Visit: Payer: Self-pay | Admitting: *Deleted

## 2012-02-29 DIAGNOSIS — C9190 Lymphoid leukemia, unspecified not having achieved remission: Secondary | ICD-10-CM

## 2012-02-29 DIAGNOSIS — C9110 Chronic lymphocytic leukemia of B-cell type not having achieved remission: Secondary | ICD-10-CM

## 2012-02-29 MED ORDER — ALLOPURINOL 300 MG PO TABS: 300.0000 mg | ORAL_TABLET | Freq: Every day | ORAL | Status: DC

## 2012-03-01 ENCOUNTER — Other Ambulatory Visit: Payer: Self-pay | Admitting: Oncology

## 2012-03-01 ENCOUNTER — Telehealth: Payer: Self-pay | Admitting: *Deleted

## 2012-03-01 NOTE — Telephone Encounter (Signed)
Per staff message from Maud, I have scheduled appts for July. Per Dr. Darrold Span due to July  4th a holiday, ok to move to the following [place].   JMW

## 2012-03-03 ENCOUNTER — Other Ambulatory Visit (HOSPITAL_BASED_OUTPATIENT_CLINIC_OR_DEPARTMENT_OTHER): Payer: Medicare Other | Admitting: Lab

## 2012-03-03 ENCOUNTER — Encounter: Payer: Self-pay | Admitting: Oncology

## 2012-03-03 ENCOUNTER — Telehealth: Payer: Self-pay | Admitting: Oncology

## 2012-03-03 ENCOUNTER — Ambulatory Visit (HOSPITAL_BASED_OUTPATIENT_CLINIC_OR_DEPARTMENT_OTHER): Payer: Medicare Other | Admitting: Oncology

## 2012-03-03 VITALS — BP 124/66 | HR 74 | Temp 97.8°F | Ht 63.0 in | Wt 221.5 lb

## 2012-03-03 DIAGNOSIS — F79 Unspecified intellectual disabilities: Secondary | ICD-10-CM

## 2012-03-03 DIAGNOSIS — I10 Essential (primary) hypertension: Secondary | ICD-10-CM

## 2012-03-03 DIAGNOSIS — C9190 Lymphoid leukemia, unspecified not having achieved remission: Secondary | ICD-10-CM

## 2012-03-03 DIAGNOSIS — C9110 Chronic lymphocytic leukemia of B-cell type not having achieved remission: Secondary | ICD-10-CM

## 2012-03-03 LAB — URIC ACID: Uric Acid, Serum: 6.5 mg/dL (ref 2.4–7.0)

## 2012-03-03 LAB — BASIC METABOLIC PANEL
BUN: 23 mg/dL (ref 6–23)
CO2: 32 mEq/L (ref 19–32)
Calcium: 9.1 mg/dL (ref 8.4–10.5)
Chloride: 101 mEq/L (ref 96–112)
Creatinine, Ser: 0.83 mg/dL (ref 0.50–1.10)
Glucose, Bld: 98 mg/dL (ref 70–99)
Potassium: 4.6 mEq/L (ref 3.5–5.3)
Sodium: 139 mEq/L (ref 135–145)

## 2012-03-03 LAB — CBC WITH DIFFERENTIAL/PLATELET
BASO%: 0.3 % (ref 0.0–2.0)
Basophils Absolute: 0 10*3/uL (ref 0.0–0.1)
EOS%: 0.9 % (ref 0.0–7.0)
Eosinophils Absolute: 0.1 10*3/uL (ref 0.0–0.5)
HCT: 32.7 % — ABNORMAL LOW (ref 34.8–46.6)
HGB: 10.8 g/dL — ABNORMAL LOW (ref 11.6–15.9)
LYMPH%: 9.9 % — ABNORMAL LOW (ref 14.0–49.7)
MCH: 29.2 pg (ref 25.1–34.0)
MCHC: 33 g/dL (ref 31.5–36.0)
MCV: 88.5 fL (ref 79.5–101.0)
MONO#: 0.6 10*3/uL (ref 0.1–0.9)
MONO%: 6.6 % (ref 0.0–14.0)
NEUT#: 7 10*3/uL — ABNORMAL HIGH (ref 1.5–6.5)
NEUT%: 82.3 % — ABNORMAL HIGH (ref 38.4–76.8)
Platelets: 109 10*3/uL — ABNORMAL LOW (ref 145–400)
RBC: 3.69 10*6/uL — ABNORMAL LOW (ref 3.70–5.45)
RDW: 17.1 % — ABNORMAL HIGH (ref 11.2–14.5)
WBC: 8.4 10*3/uL (ref 3.9–10.3)
lymph#: 0.8 10*3/uL — ABNORMAL LOW (ref 0.9–3.3)
nRBC: 0 % (ref 0–0)

## 2012-03-03 NOTE — Telephone Encounter (Signed)
appts made and printed for pt aom °

## 2012-03-03 NOTE — Patient Instructions (Signed)
Call if very tired or other problems before next visit

## 2012-03-03 NOTE — Progress Notes (Signed)
OFFICE PROGRESS NOTE Date of Visit 03-03-2012 Physicians: S.Wolters, L.Rennis Harding INTERVAL HISTORY:  Patient is seen, together with caregiver and daughter, in continuing attention to her CLL, treatment in process with treanda/rituxan. Cycle 2 was given on 5-9 and 02-25-12 with neulasta on 5-11. CLL was diagnosed fall 2011 after she presented to primary MD with transient fatigue and had WBC ~ 13k. She had cervical lymph node biopsy in Sept 2011 and bone marrow exam Nov 2011 with cytogenetics done at [facility]. She did not have high risk cytogenetics. She did, however, have rapid doubling time and platelets decreaed to < 100k. She and sister were not interested in consideration of transplant then. She was treated with fludara/Rituxan x 6 cycles from Feb 2012 thru July 2012, which she tolerated generally well. Despite regular review of medications, we learned after treatment completed that she had not taken prophylactic TMP/SMX as instructed. She had partial response to the fludara/rituxan by labs and repeat US to evaluate spleen size. We have followed since July 2012 with observation. WBC had increased since November and platelets had not improved since treatment was completed. She was seen in consultation by Velta Addison at [facility] on 12-27-2011, with recommendation for bendamustine with rituxan with findings on bone marrow 01-13-2012 including 80 - 90 % cellularity with 89% lymphocytes and decreased megakaryocytes. She began Treanda/rituxan on 4-11 and 01-28-12, with rapid marked drop in WBC, fortunately without tumor lysis complications, and improvement in platelets after cycle 1. Patient again had mild rituxan reaction with cycle 2 despite capping rate of infusion; the reaction  again began with nausea. She otherwise has had no problems with nausea and has felt entirely well. She has had no skin rash, no bleeding, good energy and appetite, no shortness of breath, no fever or other symptoms of infection.  Remainder of 10 point Review of Systems negative. Objective:  Vital signs in last 24 hours:  BP 124/66  Pulse 74  Temp(Src) 97.8 F (36.6 C) (Oral)  Ht 5\' 3"  (1.6 m)  Wt 221 lb 8 oz (100.472 kg)  BMI 39.24 kg/m2 Weight is down 2 lbs. Ambulatory without assistance, looks comfortable. Not diaphoretic, respirations not labored, no alopecia.  HEENT:mucous membranes moist, pharynx normal without lesions. PERRL, not icteric. Neck supple. LymphaticsCervical, supraclavicular, and axillary nodes normal. and no palpable adenopathy bilateral cervical, supraclavicular, axillary, inguinal Resp: clear to auscultation bilaterally and normal percussion bilaterally Cardio: regular rate and rhythm GI: obese, soft, nontender, nothing palpable, normal bowel sounds Extremities: extremities normal, atraumatic, no cyanosis or edema Neuro:no sensory deficits noted Skin without rash or eccymoses  Lab Results:   [facility] 03/03/12 0836  WBC 8.4  HGB 10.8*  HCT 32.7*  PLT 109*  ANC 7.0  BMET  Basename 03/03/12 0836  NA 139  K 4.6  CL 101  CO2 32  GLUCOSE 98  BUN 23  CREATININE 0.83  CALCIUM 9.1   Uric acid 6.5 Studies/Results:  No results found.  Medications: I have reviewed the patient's current medications.  Assessment/Plan: 1.CLL: history as above, now post second cycle bendamustine/rituxan 5-9 and 02-25-12. We are all pleased with how well she is doing, even with the rituxan problems. I will see her back in ~ 2 weeks, which should be nadir for this treatment, or sooner if needed. 2.obesity 3.HTN, elevated uric acid preceding CLL treatment on allopurinol, cognitive disability. Patient and caregiver understood plan as above.   Yeudiel Mateo P, MD   03/03/2012, 8:22 PM

## 2012-03-08 ENCOUNTER — Other Ambulatory Visit: Payer: Self-pay | Admitting: Family Medicine

## 2012-03-08 DIAGNOSIS — Z1231 Encounter for screening mammogram for malignant neoplasm of breast: Secondary | ICD-10-CM

## 2012-03-09 ENCOUNTER — Telehealth: Payer: Self-pay | Admitting: *Deleted

## 2012-03-09 NOTE — Telephone Encounter (Signed)
Pt's daughter called stating patient received a jury summons.  They are requesting a letter to be faxed to excuse pt from jury duty.  Rn left fax number on message for daughter.

## 2012-03-15 ENCOUNTER — Ambulatory Visit (HOSPITAL_BASED_OUTPATIENT_CLINIC_OR_DEPARTMENT_OTHER): Payer: Medicare Other | Admitting: Oncology

## 2012-03-15 ENCOUNTER — Telehealth: Payer: Self-pay | Admitting: Oncology

## 2012-03-15 ENCOUNTER — Encounter: Payer: Self-pay | Admitting: Oncology

## 2012-03-15 ENCOUNTER — Other Ambulatory Visit: Payer: Medicare Other | Admitting: Lab

## 2012-03-15 VITALS — BP 144/61 | HR 62 | Temp 98.2°F | Ht 63.0 in | Wt 224.3 lb

## 2012-03-15 DIAGNOSIS — I10 Essential (primary) hypertension: Secondary | ICD-10-CM

## 2012-03-15 DIAGNOSIS — C9110 Chronic lymphocytic leukemia of B-cell type not having achieved remission: Secondary | ICD-10-CM

## 2012-03-15 DIAGNOSIS — C9190 Lymphoid leukemia, unspecified not having achieved remission: Secondary | ICD-10-CM

## 2012-03-15 DIAGNOSIS — R7989 Other specified abnormal findings of blood chemistry: Secondary | ICD-10-CM

## 2012-03-15 LAB — CBC WITH DIFFERENTIAL/PLATELET
BASO%: 0.4 % (ref 0.0–2.0)
Basophils Absolute: 0 10*3/uL (ref 0.0–0.1)
EOS%: 1.4 % (ref 0.0–7.0)
Eosinophils Absolute: 0.1 10*3/uL (ref 0.0–0.5)
HCT: 33.4 % — ABNORMAL LOW (ref 34.8–46.6)
HGB: 11 g/dL — ABNORMAL LOW (ref 11.6–15.9)
LYMPH%: 14.9 % (ref 14.0–49.7)
MCH: 28.8 pg (ref 25.1–34.0)
MCHC: 32.9 g/dL (ref 31.5–36.0)
MCV: 87.5 fL (ref 79.5–101.0)
MONO#: 0.4 10*3/uL (ref 0.1–0.9)
MONO%: 8.7 % (ref 0.0–14.0)
NEUT#: 3.2 10*3/uL (ref 1.5–6.5)
NEUT%: 74.6 % (ref 38.4–76.8)
Platelets: 112 10*3/uL — ABNORMAL LOW (ref 145–400)
RBC: 3.81 10*6/uL (ref 3.70–5.45)
RDW: 17.1 % — ABNORMAL HIGH (ref 11.2–14.5)
WBC: 4.3 10*3/uL (ref 3.9–10.3)
lymph#: 0.6 10*3/uL — ABNORMAL LOW (ref 0.9–3.3)
nRBC: 0 % (ref 0–0)

## 2012-03-15 LAB — BASIC METABOLIC PANEL
BUN: 22 mg/dL (ref 6–23)
CO2: 33 mEq/L — ABNORMAL HIGH (ref 19–32)
Calcium: 9 mg/dL (ref 8.4–10.5)
Chloride: 103 mEq/L (ref 96–112)
Creatinine, Ser: 0.85 mg/dL (ref 0.50–1.10)
Glucose, Bld: 89 mg/dL (ref 70–99)
Potassium: 4.5 mEq/L (ref 3.5–5.3)
Sodium: 141 mEq/L (ref 135–145)

## 2012-03-15 LAB — URIC ACID: Uric Acid, Serum: 4.3 mg/dL (ref 2.4–7.0)

## 2012-03-15 NOTE — Patient Instructions (Addendum)
Appointments as scheduled. No blood pressure medicine before you come for treatment on 03-23-12  You can take lorazepam 0.5 mg before you leave home to come for chemotherapy on 6-6

## 2012-03-15 NOTE — Progress Notes (Signed)
OFFICE PROGRESS NOTE Date of Visit 03-15-12 Physicians: S.Wolters, L.Rennis Harding  INTERVAL HISTORY:  Patient is seen, together with daughter and caregiver, in scheduled follow up of her CLL, due third cycle of Treanda/ Rituxan on 6-6 and 03-24-12; we will use neulasta day after treatment as with previous cycles.  CLL was diagnosed fall 2011 after she presented to primary MD with transient fatigue and had WBC ~ 13k. She had cervical lymph node biopsy in Sept 2011 and bone marrow exam Nov 2011 with cytogenetics done at [facility] She did not have high risk cytogenetics. She did, however, have rapid doubling time and platelets decreaed to < 100k. She and sister were not interested in consideration of transplant then. She was treated with fludara/Rituxan x 6 cycles from Feb 2012 thru July 2012, which she tolerated generally well. Despite regular review of medications, we learned after treatment completed that she had not taken prophylactic TMP/SMX as instructed. She had partial response to the fludara/rituxan by labs and repeat US to evaluate spleen size. We have followed since July 2012 with observation. WBC had increased since November and platelets had not improved since treatment was completed. She was seen in consultation by Velta Addison at [facility] on 12-27-2011, with recommendation for bendamustine with rituxan with findings on bone marrow 01-13-2012 including 80 - 90 % cellularity with 89% lymphocytes and decreased megakaryocytes. She began Treanda/rituxan on 4-11 and 01-28-12, with rapid marked drop in WBC, fortunately without tumor lysis complications, and improvement in platelets after cycle 1. Patient again had mild rituxan reaction with cycle 2 despite capping rate of infusion; the reaction again began with nausea.   Patient has felt entirely well since she was here last, with no fever or symptoms of infection, no bleeding and no increased bruising  Objective:  Vital signs in last 24 hours:  BP  144/61  Pulse 62  Temp 98.2 F (36.8 C)  Ht 5\' 3"  (1.6 m)  Wt 224 lb 4.8 oz (101.742 kg)  BMI 39.73 kg/m2 Weight is up 3 lbs. Alert, ambulatory without assistance, appears comfortable  HEENT:mucous membranes moist, pharynx normal without lesions. PERRL, not icteric. Lymphatics: no peripheral adenopathy appreciated cervical, supraclavicular, axillary, inguinal Resp: clear to auscultation bilaterally and normal percussion bilaterally Cardio: regular rate and rhythm GI: soft, non-tender; bowel sounds normal; no masses,  no organomegaly Extremities: extremities normal, atraumatic, no cyanosis or edema Neuro:nonfocal  Lab Results:   Basename 03/15/12 1201  WBC 4.3  HGB 11.0*  HCT 33.4*  PLT 112*  ANC 3.2  BMET Resulted after visit NA 141, K 4.5, Cl 103, CO@ 33, creat 0.85, ca 9 Uric acid 4.3 Studies/Results:  No results found.  Medications: I have reviewed the patient's current medications. She will hold antihypertensives and allopurinol day 1 and can take ativan 0.5 mg prior to coming to office that day.  Assessment/Plan: 1.CLL: continuing treanda/ rituxan, cycle 3 due 6-6 and 03-24-12 with neulasta 03-25-12. Counts improved, tho had expected "nadir" this visit.  2.Rituxan reactions previously: continue to cap infusion rate. I have offered moving the date of rx as I am not scheduled in office that day, however they prefer this as set up. 3.morbid obesity 4.HTN 5.elevated uric acid separate from CLL/ treatment, on allopurinol I will see her back at least 04-04-12 or sooner if needed.  Zykerria Tanton P, MD   03/15/2012, 1:20 PM

## 2012-03-15 NOTE — Telephone Encounter (Signed)
appts made and printed for pt aom °

## 2012-03-17 ENCOUNTER — Telehealth: Payer: Self-pay | Admitting: *Deleted

## 2012-03-17 NOTE — Telephone Encounter (Signed)
Sister called requesting letter excusing Erin Moore from jury duty. She will fax information regarding requirement for letter.

## 2012-03-19 ENCOUNTER — Encounter: Payer: Self-pay | Admitting: Oncology

## 2012-03-22 ENCOUNTER — Telehealth: Payer: Self-pay | Admitting: Oncology

## 2012-03-22 ENCOUNTER — Other Ambulatory Visit: Payer: Self-pay | Admitting: Oncology

## 2012-03-22 DIAGNOSIS — C9190 Lymphoid leukemia, unspecified not having achieved remission: Secondary | ICD-10-CM

## 2012-03-22 DIAGNOSIS — C9110 Chronic lymphocytic leukemia of B-cell type not having achieved remission: Secondary | ICD-10-CM

## 2012-03-22 NOTE — Telephone Encounter (Signed)
aware of lab appt 6/6   aom

## 2012-03-22 NOTE — Telephone Encounter (Signed)
Called pt, left message , regarding labs before chemo tomorrow 03/23/12

## 2012-03-23 ENCOUNTER — Other Ambulatory Visit (HOSPITAL_BASED_OUTPATIENT_CLINIC_OR_DEPARTMENT_OTHER): Payer: Medicare Other

## 2012-03-23 ENCOUNTER — Ambulatory Visit (HOSPITAL_BASED_OUTPATIENT_CLINIC_OR_DEPARTMENT_OTHER): Payer: Medicare Other

## 2012-03-23 VITALS — BP 124/75 | HR 68 | Temp 97.9°F

## 2012-03-23 DIAGNOSIS — Z5112 Encounter for antineoplastic immunotherapy: Secondary | ICD-10-CM

## 2012-03-23 DIAGNOSIS — C9190 Lymphoid leukemia, unspecified not having achieved remission: Secondary | ICD-10-CM

## 2012-03-23 DIAGNOSIS — C9110 Chronic lymphocytic leukemia of B-cell type not having achieved remission: Secondary | ICD-10-CM

## 2012-03-23 LAB — CBC WITH DIFFERENTIAL/PLATELET
BASO%: 0.8 % (ref 0.0–2.0)
Basophils Absolute: 0 10*3/uL (ref 0.0–0.1)
EOS%: 1.6 % (ref 0.0–7.0)
Eosinophils Absolute: 0.1 10*3/uL (ref 0.0–0.5)
HCT: 33.6 % — ABNORMAL LOW (ref 34.8–46.6)
HGB: 11.2 g/dL — ABNORMAL LOW (ref 11.6–15.9)
LYMPH%: 23.8 % (ref 14.0–49.7)
MCH: 28.2 pg (ref 25.1–34.0)
MCHC: 33.3 g/dL (ref 31.5–36.0)
MCV: 84.6 fL (ref 79.5–101.0)
MONO#: 0.4 10*3/uL (ref 0.1–0.9)
MONO%: 12.1 % (ref 0.0–14.0)
NEUT#: 2.3 10*3/uL (ref 1.5–6.5)
NEUT%: 61.7 % (ref 38.4–76.8)
Platelets: 107 10*3/uL — ABNORMAL LOW (ref 145–400)
RBC: 3.97 10*6/uL (ref 3.70–5.45)
RDW: 14.7 % — ABNORMAL HIGH (ref 11.2–14.5)
WBC: 3.7 10*3/uL — ABNORMAL LOW (ref 3.9–10.3)
lymph#: 0.9 10*3/uL (ref 0.9–3.3)
nRBC: 0 % (ref 0–0)

## 2012-03-23 MED ORDER — DIPHENHYDRAMINE HCL 25 MG PO CAPS
50.0000 mg | ORAL_CAPSULE | Freq: Once | ORAL | Status: AC
  Administered 2012-03-23: 50 mg via ORAL

## 2012-03-23 MED ORDER — LORAZEPAM 1 MG PO TABS
0.5000 mg | ORAL_TABLET | ORAL | Status: DC
  Administered 2012-03-23: 0.5 mg via ORAL

## 2012-03-23 MED ORDER — SODIUM CHLORIDE 0.9 % IV SOLN
Freq: Once | INTRAVENOUS | Status: AC
  Administered 2012-03-23: 10:00:00 via INTRAVENOUS

## 2012-03-23 MED ORDER — SODIUM CHLORIDE 0.9 % IV SOLN
70.0000 mg/m2 | Freq: Once | INTRAVENOUS | Status: AC
  Administered 2012-03-23: 150 mg via INTRAVENOUS
  Filled 2012-03-23: qty 30

## 2012-03-23 MED ORDER — ACETAMINOPHEN 325 MG PO TABS
650.0000 mg | ORAL_TABLET | Freq: Once | ORAL | Status: AC
  Administered 2012-03-23: 650 mg via ORAL

## 2012-03-23 MED ORDER — SODIUM CHLORIDE 0.9 % IV SOLN
375.0000 mg/m2 | Freq: Once | INTRAVENOUS | Status: AC
  Administered 2012-03-23: 800 mg via INTRAVENOUS
  Filled 2012-03-23: qty 80

## 2012-03-23 MED ORDER — DEXAMETHASONE SODIUM PHOSPHATE 10 MG/ML IJ SOLN
10.0000 mg | Freq: Once | INTRAMUSCULAR | Status: AC
  Administered 2012-03-23: 10 mg via INTRAVENOUS

## 2012-03-23 MED ORDER — ONDANSETRON 16 MG/50ML IVPB (CHCC)
16.0000 mg | Freq: Once | INTRAVENOUS | Status: AC
  Administered 2012-03-23: 16 mg via INTRAVENOUS

## 2012-03-23 NOTE — Patient Instructions (Signed)
[facility] Discharge Instructions for Patients Receiving Chemotherapy  Today you received the following chemotherapy agents Rituxan and Treanda.  To help prevent nausea and vomiting after your treatment, we encourage you to take your nausea medication. Begin taking it as often as prescribed for by Dr. Darrold Span.    If you develop nausea and vomiting that is not controlled by your nausea medication, call the clinic. If it is after clinic hours your family physician or the after hours number for the clinic or go to the Emergency Department.   BELOW ARE SYMPTOMS THAT SHOULD BE REPORTED IMMEDIATELY:  *FEVER GREATER THAN 100.5 F  *CHILLS WITH OR WITHOUT FEVER  NAUSEA AND VOMITING THAT IS NOT CONTROLLED WITH YOUR NAUSEA MEDICATION  *UNUSUAL SHORTNESS OF BREATH  *UNUSUAL BRUISING OR BLEEDING  TENDERNESS IN MOUTH AND THROAT WITH OR WITHOUT PRESENCE OF ULCERS  *URINARY PROBLEMS  *BOWEL PROBLEMS  UNUSUAL RASH Items with * indicate a potential emergency and should be followed up as soon as possible.  One of the nurses will contact you 24 hours after your treatment. Please let the nurse know about any problems that you may have experienced. Feel free to call the clinic you have any questions or concerns. The clinic phone number is [phone].   I have been informed and understand all the instructions given to me. I know to contact the clinic, my physician, or go to the Emergency Department if any problems should occur. I do not have any questions at this time, but understand that I may call the clinic during office hours   should I have any questions or need assistance in obtaining follow up care.    __________________________________________  _____________  __________ Signature of Patient or Authorized Representative            Date                   Time    __________________________________________ Nurse's Signature

## 2012-03-23 NOTE — Progress Notes (Signed)
1028 Rituxan started @ 18ml/hr x 21ml/hr VTBI. VSS (97.7, 59, 16, 133/69). 1058 Patient tolerating treatment well with no complaints. VSS (98.3, 64, 18, 121/62). Rate increased to 61ml/hr x 88ml/hr VTBI. 1128 Patient tolerating treatment well with no complaints. VSS (98.5, 75, 18, 135/68). Rate increased to 145ml/hr x 31ml/hr VTBI. 1158 Patient tolerating treatment well with no complaints. Sleeping. VSS (98.2, 70, 16, 117/67) Rate at maximum 166ml/hr.

## 2012-03-24 ENCOUNTER — Ambulatory Visit (HOSPITAL_BASED_OUTPATIENT_CLINIC_OR_DEPARTMENT_OTHER): Payer: Medicare Other

## 2012-03-24 VITALS — BP 164/72 | HR 75 | Temp 97.5°F

## 2012-03-24 DIAGNOSIS — C9190 Lymphoid leukemia, unspecified not having achieved remission: Secondary | ICD-10-CM

## 2012-03-24 DIAGNOSIS — Z5111 Encounter for antineoplastic chemotherapy: Secondary | ICD-10-CM

## 2012-03-24 DIAGNOSIS — C9110 Chronic lymphocytic leukemia of B-cell type not having achieved remission: Secondary | ICD-10-CM

## 2012-03-24 MED ORDER — ONDANSETRON 16 MG/50ML IVPB (CHCC)
16.0000 mg | Freq: Once | INTRAVENOUS | Status: AC
  Administered 2012-03-24: 16 mg via INTRAVENOUS

## 2012-03-24 MED ORDER — LORAZEPAM 1 MG PO TABS
1.0000 mg | ORAL_TABLET | Freq: Once | ORAL | Status: AC
  Administered 2012-03-24: 1 mg via ORAL

## 2012-03-24 MED ORDER — SODIUM CHLORIDE 0.9 % IV SOLN
Freq: Once | INTRAVENOUS | Status: AC
  Administered 2012-03-24: 13:00:00 via INTRAVENOUS

## 2012-03-24 MED ORDER — DEXAMETHASONE SODIUM PHOSPHATE 10 MG/ML IJ SOLN
10.0000 mg | Freq: Once | INTRAMUSCULAR | Status: AC
  Administered 2012-03-24: 10 mg via INTRAVENOUS

## 2012-03-24 MED ORDER — SODIUM CHLORIDE 0.9 % IV SOLN
70.0000 mg/m2 | Freq: Once | INTRAVENOUS | Status: AC
  Administered 2012-03-24: 150 mg via INTRAVENOUS
  Filled 2012-03-24: qty 30

## 2012-03-24 NOTE — Patient Instructions (Signed)
[facility] Discharge Instructions for Patients Receiving Chemotherapy  Today you received the following chemotherapy agents Treanda.  To help prevent nausea and vomiting after your treatment, we encourage you to take your nausea medication.   If you develop nausea and vomiting that is not controlled by your nausea medication, call the clinic. If it is after clinic hours your family physician or the after hours number for the clinic or go to the Emergency Department.   BELOW ARE SYMPTOMS THAT SHOULD BE REPORTED IMMEDIATELY:  *FEVER GREATER THAN 100.5 F  *CHILLS WITH OR WITHOUT FEVER  NAUSEA AND VOMITING THAT IS NOT CONTROLLED WITH YOUR NAUSEA MEDICATION  *UNUSUAL SHORTNESS OF BREATH  *UNUSUAL BRUISING OR BLEEDING  TENDERNESS IN MOUTH AND THROAT WITH OR WITHOUT PRESENCE OF ULCERS  *URINARY PROBLEMS  *BOWEL PROBLEMS  UNUSUAL RASH Items with * indicate a potential emergency and should be followed up as soon as possible.  One of the nurses will contact you 24 hours after your treatment. Please let the nurse know about any problems that you may have experienced. Feel free to call the clinic you have any questions or concerns. The clinic phone number is [phone].   I have been informed and understand all the instructions given to me. I know to contact the clinic, my physician, or go to the Emergency Department if any problems should occur. I do not have any questions at this time, but understand that I may call the clinic during office hours   should I have any questions or need assistance in obtaining follow up care.    __________________________________________  _____________  __________ Signature of Patient or Authorized Representative            Date                   Time    __________________________________________ Nurse's Signature    

## 2012-03-25 ENCOUNTER — Ambulatory Visit (HOSPITAL_BASED_OUTPATIENT_CLINIC_OR_DEPARTMENT_OTHER): Payer: Medicare Other

## 2012-03-25 VITALS — BP 192/76 | HR 68 | Temp 98.2°F

## 2012-03-25 DIAGNOSIS — C9110 Chronic lymphocytic leukemia of B-cell type not having achieved remission: Secondary | ICD-10-CM

## 2012-03-25 DIAGNOSIS — C9190 Lymphoid leukemia, unspecified not having achieved remission: Secondary | ICD-10-CM

## 2012-03-25 MED ORDER — PEGFILGRASTIM INJECTION 6 MG/0.6ML
6.0000 mg | Freq: Once | SUBCUTANEOUS | Status: AC
  Administered 2012-03-25: 6 mg via SUBCUTANEOUS

## 2012-04-04 ENCOUNTER — Telehealth: Payer: Self-pay | Admitting: *Deleted

## 2012-04-04 ENCOUNTER — Telehealth: Payer: Self-pay | Admitting: Oncology

## 2012-04-04 ENCOUNTER — Ambulatory Visit (HOSPITAL_BASED_OUTPATIENT_CLINIC_OR_DEPARTMENT_OTHER): Payer: Medicare Other | Admitting: Oncology

## 2012-04-04 ENCOUNTER — Other Ambulatory Visit (HOSPITAL_BASED_OUTPATIENT_CLINIC_OR_DEPARTMENT_OTHER): Payer: Medicare Other

## 2012-04-04 ENCOUNTER — Encounter: Payer: Self-pay | Admitting: Oncology

## 2012-04-04 VITALS — BP 143/74 | HR 73 | Temp 97.3°F | Ht 63.0 in | Wt 223.0 lb

## 2012-04-04 DIAGNOSIS — C9190 Lymphoid leukemia, unspecified not having achieved remission: Secondary | ICD-10-CM

## 2012-04-04 DIAGNOSIS — C9110 Chronic lymphocytic leukemia of B-cell type not having achieved remission: Secondary | ICD-10-CM

## 2012-04-04 DIAGNOSIS — E669 Obesity, unspecified: Secondary | ICD-10-CM

## 2012-04-04 LAB — CBC WITH DIFFERENTIAL/PLATELET
BASO%: 0.1 % (ref 0.0–2.0)
Basophils Absolute: 0 10*3/uL (ref 0.0–0.1)
EOS%: 0.9 % (ref 0.0–7.0)
Eosinophils Absolute: 0.1 10*3/uL (ref 0.0–0.5)
HCT: 36.2 % (ref 34.8–46.6)
HGB: 12.2 g/dL (ref 11.6–15.9)
LYMPH%: 4.2 % — ABNORMAL LOW (ref 14.0–49.7)
MCH: 28.1 pg (ref 25.1–34.0)
MCHC: 33.7 g/dL (ref 31.5–36.0)
MCV: 83.4 fL (ref 79.5–101.0)
MONO#: 0.3 10*3/uL (ref 0.1–0.9)
MONO%: 3.1 % (ref 0.0–14.0)
NEUT#: 8.6 10*3/uL — ABNORMAL HIGH (ref 1.5–6.5)
NEUT%: 91.7 % — ABNORMAL HIGH (ref 38.4–76.8)
Platelets: 93 10*3/uL — ABNORMAL LOW (ref 145–400)
RBC: 4.34 10*6/uL (ref 3.70–5.45)
RDW: 14 % (ref 11.2–14.5)
WBC: 9.3 10*3/uL (ref 3.9–10.3)
lymph#: 0.4 10*3/uL — ABNORMAL LOW (ref 0.9–3.3)

## 2012-04-04 LAB — COMPREHENSIVE METABOLIC PANEL
ALT: 10 U/L (ref 0–35)
AST: 16 U/L (ref 0–37)
Albumin: 4.6 g/dL (ref 3.5–5.2)
Alkaline Phosphatase: 114 U/L (ref 39–117)
BUN: 28 mg/dL — ABNORMAL HIGH (ref 6–23)
CO2: 31 mEq/L (ref 19–32)
Calcium: 9.4 mg/dL (ref 8.4–10.5)
Chloride: 101 mEq/L (ref 96–112)
Creatinine, Ser: 0.86 mg/dL (ref 0.50–1.10)
Glucose, Bld: 90 mg/dL (ref 70–99)
Potassium: 4.3 mEq/L (ref 3.5–5.3)
Sodium: 138 mEq/L (ref 135–145)
Total Bilirubin: 0.5 mg/dL (ref 0.3–1.2)
Total Protein: 6 g/dL (ref 6.0–8.3)

## 2012-04-04 LAB — LACTATE DEHYDROGENASE: LDH: 132 U/L (ref 94–250)

## 2012-04-04 LAB — URIC ACID: Uric Acid, Serum: 5.1 mg/dL (ref 2.4–7.0)

## 2012-04-04 NOTE — Telephone Encounter (Signed)
Per staff message I have scheduled appts. JMW  

## 2012-04-04 NOTE — Patient Instructions (Signed)
Appointments as scheduled. 

## 2012-04-04 NOTE — Telephone Encounter (Signed)
gve the pt her July,aug 2013 appt calendar. °

## 2012-04-04 NOTE — Progress Notes (Signed)
OFFICE PROGRESS NOTE Date of Visit 04-04-12 Physicians: S.Wolters, L.Rennis Harding  INTERVAL HISTORY:  Patient is seen, together with daughter and caregiver, in continuing attention to her CLL, now post 3 cycles of treanda / rituxan most recently 6-6 and 03-24-12. She had no problems with the rituxan for the first time this treatment, having premedicated with nausea med prior to coming to office.  ObjCLL was diagnosed fall 2011 after she presented to primary MD with transient fatigue and had WBC ~ 13k. She had cervical lymph node biopsy in Sept 2011 and bone marrow exam Nov 2011 with cytogenetics done at [facility]. She did not have high risk cytogenetics. She did, however, have rapid doubling time and platelets decreaed to < 100k. She and sister were not interested in consideration of transplant then. She was treated with fludara/Rituxan x 6 cycles from Feb 2012 thru July 2012, which she tolerated generally well. Despite regular review of medications, we learned after treatment completed that she had not taken prophylactic TMP/SMX as instructed. She had partial response to the fludara/rituxan by labs and repeat US to evaluate spleen size. We have followed since July 2012 with observation. WBC had increased since November and platelets had not improved since treatment was completed. She was seen in consultation by Velta Addison at [facility] on 12-27-2011, with recommendation for bendamustine with rituxan with findings on bone marrow 01-13-2012 including 80 - 90 % cellularity with 89% lymphocytes and decreased megakaryocytes. She began Treanda/rituxan on 4-11 and 01-28-12, with rapid marked drop in WBC, fortunately without tumor lysis complications, and improvement in platelets after cycle 1. Patient again had mild rituxan reaction with cycle 2 despite capping rate of infusion; the reaction again began with nausea.   Patient was a little tired days of treatment, but since then has felt entirely well. She does not  experience any significant aches with the neulasta. She has had no fever or symptoms of infection, no bleeding, no nausea, has been going about all of usual activities including working out at gym. She has no new or different pain. Appetite is good and she denies any shortness of breath or cough. Remainder of 10 point Review of Systems negative. Obective:  Vital signs in last 24 hours:  BP 143/74  Pulse 73  Temp 97.3 F (36.3 C) (Oral)  Ht 5\' 3"  (1.6 m)  Wt 223 lb (101.152 kg)  BMI 39.50 kg/m2 This weight is down 1 lb. Ambulatory without assistance. Not diaphoretic. Looks comfortable   HEENT:mucous membranes moist, pharynx normal without lesions. PERRL, not icteric. No alopecia. LymphaticsCervical, supraclavicular, and axillary nodes normal. Resp: clear to auscultation bilaterally and normal percussion bilaterally Cardio: regular rate and rhythm GI: soft, non-tender; bowel sounds normal; no masses,  no organomegaly as far as can determine with obesity Extremities: extremities normal, atraumatic, no cyanosis or edema Neuro:no sensory deficits note Skin without rash or petechiae Sites of IV access not remarkable Lab Results:   [facility] 04/04/12 1217  WBC 9.3  HGB 12.2  HCT 36.2  PLT 93*   ANC 8.6 BMET  Basename 04/04/12 1217  NA 138  K 4.3  CL 101  CO2 31  GLUCOSE 90  BUN 28*  CREATININE 0.86  CALCIUM 9.4  remainder of full CMET normal LDH 132 Uric acid 5.1 still on allopurinol Studies/Results:  No results found.  Medications: I have reviewed the patient's current medications.  Clarified with patient that she has now had 3 cycles of the treanda/ rituxan. Platelet count has improved but not normalized  between cycles as yet. As she is tolerating treatment so well, and obviously is having some improvement to this point, I expect that we will try to go to 6 cycles as Dr Rennis Harding had originally recommended.   Assessment/Plan: 1.CLL: doing well with treanda/ rituxan and  some clinical response obvious. As long as no problems, she will have cycle 4 on 7-8 and 04-25-12 with neulasta on 04-26-12. She will take antiemetics prior to coming to office as she did cycle 3. I will see her with counts and chemistries at least 05-19-12 or sooner if needed 2.obesity 3.cognitive disability tho she is still very functional. Sister is HCPOA and caregiver has accompanied to office visits and treatments recently.  Patient and caregiver, daughter in agreement with plan as above.  Reece Packer, MD   04/04/2012, 9:48 PM

## 2012-04-10 ENCOUNTER — Telehealth: Payer: Self-pay | Admitting: Oncology

## 2012-04-10 NOTE — Telephone Encounter (Signed)
Called pt and left message regarding July 2013 appt

## 2012-04-13 ENCOUNTER — Ambulatory Visit
Admission: RE | Admit: 2012-04-13 | Discharge: 2012-04-13 | Disposition: A | Discharge status: Home / Self Care | Payer: Medicare Other | Source: Ambulatory Visit | Attending: Family Medicine | Admitting: Family Medicine

## 2012-04-13 DIAGNOSIS — Z1231 Encounter for screening mammogram for malignant neoplasm of breast: Secondary | ICD-10-CM

## 2012-04-14 ENCOUNTER — Telehealth: Payer: Self-pay

## 2012-04-14 NOTE — Telephone Encounter (Signed)
Left message for Erin Moore that Ms. Radigan's Bone density test by PCP can be scheduled any time in August per Dr. Darrold Span.  The chemotherapy will not affect the scan results.

## 2012-04-19 ENCOUNTER — Other Ambulatory Visit: Payer: Self-pay | Admitting: Oncology

## 2012-04-19 ENCOUNTER — Telehealth: Payer: Self-pay | Admitting: Oncology

## 2012-04-19 ENCOUNTER — Encounter: Payer: Self-pay | Admitting: Oncology

## 2012-04-19 NOTE — Progress Notes (Unsigned)
Patient is scheduled for same chemo on two separate sets of dates. Orders + email to scheduler to clarify with patient and cancel the incorrect dates. Dates of orders may need to move depending on correct dates. Erin Mcgill, MD

## 2012-04-19 NOTE — Telephone Encounter (Signed)
Per 7/3 pof pt has appts for 7/8, 7/9, 7/10 and 7/11, 7/12, 7/13. Per pof confirm w/pt which set is correct - cx other set and let LL know if dates need to change. lmonvm for pt to call me to confirm above appt.

## 2012-04-23 ENCOUNTER — Other Ambulatory Visit: Payer: Self-pay | Admitting: Oncology

## 2012-04-24 ENCOUNTER — Other Ambulatory Visit (HOSPITAL_BASED_OUTPATIENT_CLINIC_OR_DEPARTMENT_OTHER): Payer: Medicare Other | Admitting: Oncology

## 2012-04-24 ENCOUNTER — Ambulatory Visit (HOSPITAL_BASED_OUTPATIENT_CLINIC_OR_DEPARTMENT_OTHER): Payer: Medicare Other

## 2012-04-24 ENCOUNTER — Ambulatory Visit: Payer: Medicare Other

## 2012-04-24 VITALS — BP 138/71 | HR 61 | Temp 97.2°F

## 2012-04-24 DIAGNOSIS — C9110 Chronic lymphocytic leukemia of B-cell type not having achieved remission: Secondary | ICD-10-CM

## 2012-04-24 DIAGNOSIS — C9190 Lymphoid leukemia, unspecified not having achieved remission: Secondary | ICD-10-CM

## 2012-04-24 DIAGNOSIS — Z5111 Encounter for antineoplastic chemotherapy: Secondary | ICD-10-CM

## 2012-04-24 LAB — CBC WITH DIFFERENTIAL/PLATELET
BASO%: 0.3 % (ref 0.0–2.0)
Basophils Absolute: 0 10*3/uL (ref 0.0–0.1)
EOS%: 2.4 % (ref 0.0–7.0)
Eosinophils Absolute: 0.1 10*3/uL (ref 0.0–0.5)
HCT: 33.9 % — ABNORMAL LOW (ref 34.8–46.6)
HGB: 11.6 g/dL (ref 11.6–15.9)
LYMPH%: 15.1 % (ref 14.0–49.7)
MCH: 28.8 pg (ref 25.1–34.0)
MCHC: 34.1 g/dL (ref 31.5–36.0)
MCV: 84.4 fL (ref 79.5–101.0)
MONO#: 0.3 10*3/uL (ref 0.1–0.9)
MONO%: 9.9 % (ref 0.0–14.0)
NEUT#: 2.2 10*3/uL (ref 1.5–6.5)
NEUT%: 72.3 % (ref 38.4–76.8)
Platelets: 96 10*3/uL — ABNORMAL LOW (ref 145–400)
RBC: 4.01 10*6/uL (ref 3.70–5.45)
RDW: 15.2 % — ABNORMAL HIGH (ref 11.2–14.5)
WBC: 3 10*3/uL — ABNORMAL LOW (ref 3.9–10.3)
lymph#: 0.5 10*3/uL — ABNORMAL LOW (ref 0.9–3.3)

## 2012-04-24 MED ORDER — SODIUM CHLORIDE 0.9 % IV SOLN
375.0000 mg/m2 | Freq: Once | INTRAVENOUS | Status: AC
  Administered 2012-04-24: 800 mg via INTRAVENOUS
  Filled 2012-04-24: qty 80

## 2012-04-24 MED ORDER — DIPHENHYDRAMINE HCL 25 MG PO CAPS
50.0000 mg | ORAL_CAPSULE | Freq: Once | ORAL | Status: AC
  Administered 2012-04-24: 50 mg via ORAL

## 2012-04-24 MED ORDER — SODIUM CHLORIDE 0.9 % IV SOLN
70.0000 mg/m2 | Freq: Once | INTRAVENOUS | Status: AC
  Administered 2012-04-24: 150 mg via INTRAVENOUS
  Filled 2012-04-24: qty 30

## 2012-04-24 MED ORDER — ONDANSETRON 16 MG/50ML IVPB (CHCC)
16.0000 mg | Freq: Once | INTRAVENOUS | Status: AC
  Administered 2012-04-24: 16 mg via INTRAVENOUS

## 2012-04-24 MED ORDER — DEXAMETHASONE SODIUM PHOSPHATE 10 MG/ML IJ SOLN
10.0000 mg | Freq: Once | INTRAMUSCULAR | Status: AC
  Administered 2012-04-24: 10 mg via INTRAVENOUS

## 2012-04-24 MED ORDER — ACETAMINOPHEN 325 MG PO TABS
650.0000 mg | ORAL_TABLET | Freq: Once | ORAL | Status: AC
  Administered 2012-04-24: 650 mg via ORAL

## 2012-04-24 MED ORDER — LORAZEPAM 1 MG PO TABS
0.5000 mg | ORAL_TABLET | ORAL | Status: DC
  Administered 2012-04-24: 0.5 mg via ORAL

## 2012-04-24 MED ORDER — SODIUM CHLORIDE 0.9 % IV SOLN
Freq: Once | INTRAVENOUS | Status: AC
  Administered 2012-04-24: 10:00:00 via INTRAVENOUS

## 2012-04-24 NOTE — Progress Notes (Signed)
Ok to treat with platelets of 96 per Dr Cleophas Dunker.

## 2012-04-24 NOTE — Patient Instructions (Addendum)
[facility] Discharge Instructions for Patients Receiving Chemotherapy  Today you received the following chemotherapy agents Rituxan and Treanda  To help prevent nausea and vomiting after your treatment, we encourage you to take your nausea medication.   If you develop nausea and vomiting that is not controlled by your nausea medication, call the clinic. If it is after clinic hours your family physician or the after hours number for the clinic or go to the Emergency Department.   BELOW ARE SYMPTOMS THAT SHOULD BE REPORTED IMMEDIATELY:  *FEVER GREATER THAN 100.5 F  *CHILLS WITH OR WITHOUT FEVER  NAUSEA AND VOMITING THAT IS NOT CONTROLLED WITH YOUR NAUSEA MEDICATION  *UNUSUAL SHORTNESS OF BREATH  *UNUSUAL BRUISING OR BLEEDING  TENDERNESS IN MOUTH AND THROAT WITH OR WITHOUT PRESENCE OF ULCERS  *URINARY PROBLEMS  *BOWEL PROBLEMS  UNUSUAL RASH Items with * indicate a potential emergency and should be followed up as soon as possible.  One of the nurses will contact you 24 hours after your treatment. Please let the nurse know about any problems that you may have experienced. Feel free to call the clinic you have any questions or concerns. The clinic phone number is [phone].   I have been informed and understand all the instructions given to me. I know to contact the clinic, my physician, or go to the Emergency Department if any problems should occur. I do not have any questions at this time, but understand that I may call the clinic during office hours   should I have any questions or need assistance in obtaining follow up care.    __________________________________________  _____________  __________ Signature of Patient or Authorized Representative            Date                   Time    __________________________________________ Nurse's Signature    

## 2012-04-25 ENCOUNTER — Other Ambulatory Visit: Payer: Self-pay | Admitting: Oncology

## 2012-04-25 ENCOUNTER — Other Ambulatory Visit: Payer: Medicare Other | Admitting: Lab

## 2012-04-25 ENCOUNTER — Ambulatory Visit (HOSPITAL_BASED_OUTPATIENT_CLINIC_OR_DEPARTMENT_OTHER): Payer: Medicare Other

## 2012-04-25 ENCOUNTER — Other Ambulatory Visit: Payer: Self-pay | Admitting: *Deleted

## 2012-04-25 VITALS — BP 159/74 | HR 69 | Temp 97.5°F

## 2012-04-25 DIAGNOSIS — Z5111 Encounter for antineoplastic chemotherapy: Secondary | ICD-10-CM

## 2012-04-25 DIAGNOSIS — C9110 Chronic lymphocytic leukemia of B-cell type not having achieved remission: Secondary | ICD-10-CM

## 2012-04-25 DIAGNOSIS — C9190 Lymphoid leukemia, unspecified not having achieved remission: Secondary | ICD-10-CM

## 2012-04-25 MED ORDER — DEXAMETHASONE SODIUM PHOSPHATE 10 MG/ML IJ SOLN
10.0000 mg | Freq: Once | INTRAMUSCULAR | Status: AC
  Administered 2012-04-25: 10 mg via INTRAVENOUS

## 2012-04-25 MED ORDER — SODIUM CHLORIDE 0.9 % IV SOLN
70.0000 mg/m2 | Freq: Once | INTRAVENOUS | Status: AC
  Administered 2012-04-25: 150 mg via INTRAVENOUS
  Filled 2012-04-25: qty 30

## 2012-04-25 MED ORDER — ONDANSETRON 16 MG/50ML IVPB (CHCC)
16.0000 mg | Freq: Once | INTRAVENOUS | Status: AC
  Administered 2012-04-25: 16 mg via INTRAVENOUS

## 2012-04-25 MED ORDER — LORAZEPAM 1 MG PO TABS
1.0000 mg | ORAL_TABLET | Freq: Once | ORAL | Status: AC
  Administered 2012-04-25: 1 mg via ORAL

## 2012-04-25 MED ORDER — SODIUM CHLORIDE 0.9 % IV SOLN
Freq: Once | INTRAVENOUS | Status: AC
  Administered 2012-04-25: 10:00:00 via INTRAVENOUS

## 2012-04-25 NOTE — Patient Instructions (Signed)
[facility] Discharge Instructions for Patients Receiving Chemotherapy  Today you received the following chemotherapy agents Treanda.  To help prevent nausea and vomiting after your treatment, we encourage you to take your nausea medication.   If you develop nausea and vomiting that is not controlled by your nausea medication, call the clinic. If it is after clinic hours your family physician or the after hours number for the clinic or go to the Emergency Department.   BELOW ARE SYMPTOMS THAT SHOULD BE REPORTED IMMEDIATELY:  *FEVER GREATER THAN 100.5 F  *CHILLS WITH OR WITHOUT FEVER  NAUSEA AND VOMITING THAT IS NOT CONTROLLED WITH YOUR NAUSEA MEDICATION  *UNUSUAL SHORTNESS OF BREATH  *UNUSUAL BRUISING OR BLEEDING  TENDERNESS IN MOUTH AND THROAT WITH OR WITHOUT PRESENCE OF ULCERS  *URINARY PROBLEMS  *BOWEL PROBLEMS  UNUSUAL RASH Items with * indicate a potential emergency and should be followed up as soon as possible.  One of the nurses will contact you 24 hours after your treatment. Please let the nurse know about any problems that you may have experienced. Feel free to call the clinic you have any questions or concerns. The clinic phone number is [phone].   I have been informed and understand all the instructions given to me. I know to contact the clinic, my physician, or go to the Emergency Department if any problems should occur. I do not have any questions at this time, but understand that I may call the clinic during office hours   should I have any questions or need assistance in obtaining follow up care.    __________________________________________  _____________  __________ Signature of Patient or Authorized Representative            Date                   Time    __________________________________________ Nurse's Signature    

## 2012-04-26 ENCOUNTER — Ambulatory Visit (HOSPITAL_BASED_OUTPATIENT_CLINIC_OR_DEPARTMENT_OTHER): Payer: Medicare Other

## 2012-04-26 VITALS — BP 154/76 | HR 72 | Temp 97.7°F

## 2012-04-26 DIAGNOSIS — C9190 Lymphoid leukemia, unspecified not having achieved remission: Secondary | ICD-10-CM

## 2012-04-26 DIAGNOSIS — C9110 Chronic lymphocytic leukemia of B-cell type not having achieved remission: Secondary | ICD-10-CM

## 2012-04-26 MED ORDER — PEGFILGRASTIM INJECTION 6 MG/0.6ML
6.0000 mg | Freq: Once | SUBCUTANEOUS | Status: AC
  Administered 2012-04-26: 6 mg via SUBCUTANEOUS
  Filled 2012-04-26: qty 0.6

## 2012-04-27 ENCOUNTER — Other Ambulatory Visit: Payer: Medicare Other | Admitting: Lab

## 2012-04-27 ENCOUNTER — Ambulatory Visit: Payer: Medicare Other

## 2012-04-28 ENCOUNTER — Ambulatory Visit: Payer: Medicare Other

## 2012-04-29 ENCOUNTER — Ambulatory Visit: Payer: Medicare Other

## 2012-05-19 ENCOUNTER — Telehealth: Payer: Self-pay | Admitting: Oncology

## 2012-05-19 ENCOUNTER — Encounter: Payer: Self-pay | Admitting: Oncology

## 2012-05-19 ENCOUNTER — Ambulatory Visit (HOSPITAL_BASED_OUTPATIENT_CLINIC_OR_DEPARTMENT_OTHER): Payer: Medicare Other | Admitting: Oncology

## 2012-05-19 ENCOUNTER — Other Ambulatory Visit: Payer: Medicare Other | Admitting: Lab

## 2012-05-19 VITALS — BP 132/63 | HR 67 | Temp 97.5°F | Resp 20 | Ht 63.0 in | Wt 229.4 lb

## 2012-05-19 DIAGNOSIS — C9190 Lymphoid leukemia, unspecified not having achieved remission: Secondary | ICD-10-CM

## 2012-05-19 DIAGNOSIS — C9110 Chronic lymphocytic leukemia of B-cell type not having achieved remission: Secondary | ICD-10-CM

## 2012-05-19 DIAGNOSIS — E669 Obesity, unspecified: Secondary | ICD-10-CM

## 2012-05-19 LAB — CBC WITH DIFFERENTIAL/PLATELET
BASO%: 0.5 % (ref 0.0–2.0)
Basophils Absolute: 0 10*3/uL (ref 0.0–0.1)
EOS%: 2.8 % (ref 0.0–7.0)
Eosinophils Absolute: 0.1 10*3/uL (ref 0.0–0.5)
HCT: 36.3 % (ref 34.8–46.6)
HGB: 12.4 g/dL (ref 11.6–15.9)
LYMPH%: 7.5 % — ABNORMAL LOW (ref 14.0–49.7)
MCH: 28.8 pg (ref 25.1–34.0)
MCHC: 34 g/dL (ref 31.5–36.0)
MCV: 84.5 fL (ref 79.5–101.0)
MONO#: 0.4 10*3/uL (ref 0.1–0.9)
MONO%: 9.2 % (ref 0.0–14.0)
NEUT#: 3.6 10*3/uL (ref 1.5–6.5)
NEUT%: 80 % — ABNORMAL HIGH (ref 38.4–76.8)
Platelets: 121 10*3/uL — ABNORMAL LOW (ref 145–400)
RBC: 4.3 10*6/uL (ref 3.70–5.45)
RDW: 15.2 % — ABNORMAL HIGH (ref 11.2–14.5)
WBC: 4.5 10*3/uL (ref 3.9–10.3)
lymph#: 0.3 10*3/uL — ABNORMAL LOW (ref 0.9–3.3)

## 2012-05-19 LAB — COMPREHENSIVE METABOLIC PANEL
ALT: 22 U/L (ref 0–35)
AST: 20 U/L (ref 0–37)
Albumin: 4.2 g/dL (ref 3.5–5.2)
Alkaline Phosphatase: 106 U/L (ref 39–117)
BUN: 21 mg/dL (ref 6–23)
CO2: 30 mEq/L (ref 19–32)
Calcium: 9.2 mg/dL (ref 8.4–10.5)
Chloride: 102 mEq/L (ref 96–112)
Creatinine, Ser: 0.96 mg/dL (ref 0.50–1.10)
Glucose, Bld: 85 mg/dL (ref 70–99)
Potassium: 4.3 mEq/L (ref 3.5–5.3)
Sodium: 141 mEq/L (ref 135–145)
Total Bilirubin: 0.5 mg/dL (ref 0.3–1.2)
Total Protein: 5.7 g/dL — ABNORMAL LOW (ref 6.0–8.3)

## 2012-05-19 LAB — LACTATE DEHYDROGENASE: LDH: 106 U/L (ref 94–250)

## 2012-05-19 NOTE — Progress Notes (Signed)
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  CLL was diagnosed fall 2011 after she presented to primary MD with fatigue and had WBC ~ 13k. She had cervical lymph node biopsy in Sept 2011 and bone marrow exam Nov 2011 with cytogenetics done at [facility]. She did not have high risk cytogenetics. She did, however, have rapid doubling time and platelets decreaed to < 100k. She and sister were not interested in consideration of transplant then. She was treated with fludara/Rituxan x 6 cycles from Feb 2012 thru July 2012, which she tolerated generally well. Despite regular review of medications, we learned after treatment completed that she had not taken prophylactic TMP/SMX as instructed. She had partial response to the fludara/rituxan by labs and repeat US to evaluate spleen size. We have followed since July 2012 with observation. WBC had increased since November and platelets had not improved since treatment was completed. She was seen in consultation by Velta Addison at [facility] on 12-27-2011, with recommendation for bendamustine with rituxan with findings on bone marrow 01-13-2012 including 80 - 90 % cellularity with 89% lymphocytes and decreased megakaryocytes. She began Treanda/rituxan on 4-11 and 01-28-12, with rapid marked drop in WBC, fortunately without tumor lysis complications, and improvement in platelets after cycle 1. Patient again had mild rituxan reaction with cycle 2 despite capping rate of infusion; the reaction again began with nausea. She tolerated cycles 3 and 4 with no difficulty after using ativan prior to coming for treatment.  Otherwise patient has also done very well since most recent treatment. She is drowsy until day after treatment, then very energetic. She has no  significant aches following neulasta. She has had no fever or symptoms of infection, no bleeding, no pain, no nausea, good appetite, no shortness of breath. She has been working out with trainer 3x weekly. She has no uncomfortable adenopathy and no abdominal pain. Remainder of 10 point Review of Systems negative.  Objective:  Vital signs in last 24 hours:  BP 132/63  Pulse 67  Temp 97.5 F (36.4 C) (Oral)  Resp 20  Ht 5\' 3"  (1.6 m)  Wt 229 lb 6.4 oz (104.055 kg)  BMI 40.64 kg/m2 Weight is up 6 lbs. No alopecia. Easily mobile, looks comfortable, in good spirits.  HEENT:PERRLA, sclera clear, anicteric and oropharynx clear, no lesions Lymphaticsno appreciable peripheral adenopathy Resp: clear to auscultation bilaterally and normal percussion bilaterally Cardio: regular rate and rhythm GI: soft, non-tender; bowel sounds normal; no masses,  no organomegaly appreciable, obese. Extremities: extremities normal, atraumatic, no cyanosis or edema Neuro:nonfocal Skin without rash, ecchymoses, petechiae Lab Results:  Results for orders placed in visit on 04/26/12  CBC WITH DIFFERENTIAL      Component Value Range   WBC 4.5  3.9 - 10.3 10e3/uL   NEUT# 3.6  1.5 - 6.5 10e3/uL   HGB 12.4  11.6 - 15.9 g/dL   HCT 16.1  09.6 - 04.5 %   Platelets 121 (*) 145 - 400 10e3/uL   MCV 84.5  79.5 - 101.0 fL   MCH 28.8  25.1 - 34.0 pg   MCHC 34.0  31.5 - 36.0 g/dL   RBC 4.09  8.11 - 9.14 10e6/uL   RDW 15.2 (*) 11.2 - 14.5 %   lymph# 0.3 (*) 0.9 - 3.3 10e3/uL   MONO# 0.4  0.1 - 0.9 10e3/uL   Eosinophils Absolute 0.1  0.0 - 0.5 10e3/uL   Basophils Absolute 0.0  0.0 - 0.1 10e3/uL   NEUT% 80.0 (*) 38.4 - 76.8 %   LYMPH% 7.5 (*) 14.0 - 49.7 %   MONO% 9.2  0.0 - 14.0 %   EOS% 2.8  0.0 - 7.0 %   BASO% 0.5  0.0 - 2.0 %  COMPREHENSIVE METABOLIC PANEL      Component Value Range   Sodium 141  135 - 145 mEq/L   Potassium 4.3  3.5 - 5.3 mEq/L   Chloride 102  96 - 112 mEq/L   CO2 30  19 - 32 mEq/L    Glucose, Bld 85  70 - 99 mg/dL   BUN 21  6 - 23 mg/dL   Creatinine, Ser 1.61  0.50 - 1.10 mg/dL   Total Bilirubin 0.5  0.3 - 1.2 mg/dL   Alkaline Phosphatase 106  39 - 117 U/L   AST 20  0 - 37 U/L   ALT 22  0 - 35 U/L   Total Protein 5.7 (*) 6.0 - 8.3 g/dL   Albumin 4.2  3.5 - 5.2 g/dL   Calcium 9.2  8.4 - 09.6 mg/dL  LACTATE DEHYDROGENASE      Component Value Range   LDH 106  94 - 250 U/L    Platelet count today is best that she has had Studies/Results:  No results found.  Medications: I have reviewed the patient's current medications. She continues to take multiple supplements  Assessment/Plan: 1. CLL: history as above. She and family are in agreement with continuing thru 6 cycles of present Treanda/ Rituxan. Cycle 5 will be given 8-5 and 8-6 due to scheduling constraints. I will see her ~ 3 weeks after treatment or sooner if needed. She will need neulasta on day 3 2. Obesity 3.cognitive disability such that sister is HCPOA and caregiver or sister accompany to visits/ treatments.   Perkins Molina P, MD   05/19/2012, 7:43 PM

## 2012-05-19 NOTE — Patient Instructions (Signed)
Appointments as scheduled. 

## 2012-05-19 NOTE — Telephone Encounter (Signed)
Gave pt appt date for end of August lab and MD, gave Melissa copy of chemo order for 8/6

## 2012-05-19 NOTE — Telephone Encounter (Signed)
Gave pt appt calendar for whole month of August 2013 lab, chemo, and MD

## 2012-05-22 ENCOUNTER — Other Ambulatory Visit: Payer: Self-pay | Admitting: Oncology

## 2012-05-22 ENCOUNTER — Ambulatory Visit (HOSPITAL_BASED_OUTPATIENT_CLINIC_OR_DEPARTMENT_OTHER): Payer: Medicare Other

## 2012-05-22 ENCOUNTER — Other Ambulatory Visit (HOSPITAL_BASED_OUTPATIENT_CLINIC_OR_DEPARTMENT_OTHER): Payer: Medicare Other | Admitting: Lab

## 2012-05-22 VITALS — BP 155/76 | HR 71 | Temp 97.4°F | Resp 20

## 2012-05-22 DIAGNOSIS — Z5112 Encounter for antineoplastic immunotherapy: Secondary | ICD-10-CM

## 2012-05-22 DIAGNOSIS — C9110 Chronic lymphocytic leukemia of B-cell type not having achieved remission: Secondary | ICD-10-CM

## 2012-05-22 DIAGNOSIS — Z5111 Encounter for antineoplastic chemotherapy: Secondary | ICD-10-CM

## 2012-05-22 DIAGNOSIS — C9190 Lymphoid leukemia, unspecified not having achieved remission: Secondary | ICD-10-CM

## 2012-05-22 LAB — CBC WITH DIFFERENTIAL/PLATELET
BASO%: 0.5 % (ref 0.0–2.0)
Basophils Absolute: 0 10*3/uL (ref 0.0–0.1)
EOS%: 2.4 % (ref 0.0–7.0)
Eosinophils Absolute: 0.1 10*3/uL (ref 0.0–0.5)
HCT: 35.3 % (ref 34.8–46.6)
HGB: 12.6 g/dL (ref 11.6–15.9)
LYMPH%: 9.1 % — ABNORMAL LOW (ref 14.0–49.7)
MCH: 28.7 pg (ref 25.1–34.0)
MCHC: 35.7 g/dL (ref 31.5–36.0)
MCV: 80.4 fL (ref 79.5–101.0)
MONO#: 0.4 10*3/uL (ref 0.1–0.9)
MONO%: 9.1 % (ref 0.0–14.0)
NEUT#: 3.3 10*3/uL (ref 1.5–6.5)
NEUT%: 78.9 % — ABNORMAL HIGH (ref 38.4–76.8)
Platelets: 122 10*3/uL — ABNORMAL LOW (ref 145–400)
RBC: 4.39 10*6/uL (ref 3.70–5.45)
RDW: 14 % (ref 11.2–14.5)
WBC: 4.2 10*3/uL (ref 3.9–10.3)
lymph#: 0.4 10*3/uL — ABNORMAL LOW (ref 0.9–3.3)
nRBC: 0 % (ref 0–0)

## 2012-05-22 MED ORDER — LORAZEPAM 1 MG PO TABS
0.5000 mg | ORAL_TABLET | ORAL | Status: DC
  Administered 2012-05-22: 0.5 mg via ORAL

## 2012-05-22 MED ORDER — DIPHENHYDRAMINE HCL 25 MG PO CAPS
50.0000 mg | ORAL_CAPSULE | Freq: Once | ORAL | Status: AC
  Administered 2012-05-22: 50 mg via ORAL

## 2012-05-22 MED ORDER — DEXAMETHASONE SODIUM PHOSPHATE 10 MG/ML IJ SOLN
10.0000 mg | Freq: Once | INTRAMUSCULAR | Status: AC
  Administered 2012-05-22: 10 mg via INTRAVENOUS

## 2012-05-22 MED ORDER — ACETAMINOPHEN 325 MG PO TABS
650.0000 mg | ORAL_TABLET | Freq: Once | ORAL | Status: AC
  Administered 2012-05-22: 650 mg via ORAL

## 2012-05-22 MED ORDER — ONDANSETRON 16 MG/50ML IVPB (CHCC)
16.0000 mg | Freq: Once | INTRAVENOUS | Status: AC
  Administered 2012-05-22: 16 mg via INTRAVENOUS

## 2012-05-22 MED ORDER — SODIUM CHLORIDE 0.9 % IV SOLN
70.0000 mg/m2 | Freq: Once | INTRAVENOUS | Status: AC
  Administered 2012-05-22: 150 mg via INTRAVENOUS
  Filled 2012-05-22: qty 30

## 2012-05-22 MED ORDER — SODIUM CHLORIDE 0.9 % IV SOLN
375.0000 mg/m2 | Freq: Once | INTRAVENOUS | Status: AC
  Administered 2012-05-22: 800 mg via INTRAVENOUS
  Filled 2012-05-22: qty 80

## 2012-05-22 MED ORDER — SODIUM CHLORIDE 0.9 % IV SOLN
Freq: Once | INTRAVENOUS | Status: AC
  Administered 2012-05-22: 09:00:00 via INTRAVENOUS

## 2012-05-22 NOTE — Patient Instructions (Addendum)
[facility] Discharge Instructions for Patients Receiving Chemotherapy  Today you received the following chemotherapy agents Rituxan and Treanda.  To help prevent nausea and vomiting after your treatment, we encourage you to take your nausea medication as ordered per MD.    If you develop nausea and vomiting that is not controlled by your nausea medication, call the clinic. If it is after clinic hours your family physician or the after hours number for the clinic or go to the Emergency Department.   BELOW ARE SYMPTOMS THAT SHOULD BE REPORTED IMMEDIATELY:  *FEVER GREATER THAN 100.5 F  *CHILLS WITH OR WITHOUT FEVER  NAUSEA AND VOMITING THAT IS NOT CONTROLLED WITH YOUR NAUSEA MEDICATION  *UNUSUAL SHORTNESS OF BREATH  *UNUSUAL BRUISING OR BLEEDING  TENDERNESS IN MOUTH AND THROAT WITH OR WITHOUT PRESENCE OF ULCERS  *URINARY PROBLEMS  *BOWEL PROBLEMS  UNUSUAL RASH Items with * indicate a potential emergency and should be followed up as soon as possible.   Please let the nurse know about any problems that you may have experienced. Feel free to call the clinic you have any questions or concerns. The clinic phone number is [phone].   I have been informed and understand all the instructions given to me. I know to contact the clinic, my physician, or go to the Emergency Department if any problems should occur. I do not have any questions at this time, but understand that I may call the clinic during office hours   should I have any questions or need assistance in obtaining follow up care.    __________________________________________  _____________  __________ Signature of Patient or Authorized Representative            Date                   Time    __________________________________________ Nurse's Signature    

## 2012-05-23 ENCOUNTER — Ambulatory Visit (HOSPITAL_BASED_OUTPATIENT_CLINIC_OR_DEPARTMENT_OTHER): Payer: Medicare Other

## 2012-05-23 VITALS — BP 136/72 | HR 68 | Temp 97.6°F | Resp 18

## 2012-05-23 DIAGNOSIS — C9110 Chronic lymphocytic leukemia of B-cell type not having achieved remission: Secondary | ICD-10-CM

## 2012-05-23 DIAGNOSIS — C9190 Lymphoid leukemia, unspecified not having achieved remission: Secondary | ICD-10-CM

## 2012-05-23 DIAGNOSIS — Z5111 Encounter for antineoplastic chemotherapy: Secondary | ICD-10-CM

## 2012-05-23 MED ORDER — DEXAMETHASONE SODIUM PHOSPHATE 10 MG/ML IJ SOLN
10.0000 mg | Freq: Once | INTRAMUSCULAR | Status: AC
  Administered 2012-05-23: 10 mg via INTRAVENOUS

## 2012-05-23 MED ORDER — SODIUM CHLORIDE 0.9 % IV SOLN
Freq: Once | INTRAVENOUS | Status: AC
  Administered 2012-05-23: 16:00:00 via INTRAVENOUS

## 2012-05-23 MED ORDER — ONDANSETRON 16 MG/50ML IVPB (CHCC)
16.0000 mg | Freq: Once | INTRAVENOUS | Status: AC
  Administered 2012-05-23: 16 mg via INTRAVENOUS

## 2012-05-23 MED ORDER — SODIUM CHLORIDE 0.9 % IV SOLN
70.0000 mg/m2 | Freq: Once | INTRAVENOUS | Status: AC
  Administered 2012-05-23: 150 mg via INTRAVENOUS
  Filled 2012-05-23: qty 30

## 2012-05-23 NOTE — Patient Instructions (Addendum)
[facility] Discharge Instructions for Patients Receiving Chemotherapy  Today you received the following chemotherapy agents Treanda.  To help prevent nausea and vomiting after your treatment, we encourage you to take your nausea medication.   If you develop nausea and vomiting that is not controlled by your nausea medication, call the clinic. If it is after clinic hours your family physician or the after hours number for the clinic or go to the Emergency Department.   BELOW ARE SYMPTOMS THAT SHOULD BE REPORTED IMMEDIATELY:  *FEVER GREATER THAN 100.5 F  *CHILLS WITH OR WITHOUT FEVER  NAUSEA AND VOMITING THAT IS NOT CONTROLLED WITH YOUR NAUSEA MEDICATION  *UNUSUAL SHORTNESS OF BREATH  *UNUSUAL BRUISING OR BLEEDING  TENDERNESS IN MOUTH AND THROAT WITH OR WITHOUT PRESENCE OF ULCERS  *URINARY PROBLEMS  *BOWEL PROBLEMS  UNUSUAL RASH Items with * indicate a potential emergency and should be followed up as soon as possible.  One of the nurses will contact you 24 hours after your treatment. Please let the nurse know about any problems that you may have experienced. Feel free to call the clinic you have any questions or concerns. The clinic phone number is [phone].   I have been informed and understand all the instructions given to me. I know to contact the clinic, my physician, or go to the Emergency Department if any problems should occur. I do not have any questions at this time, but understand that I may call the clinic during office hours   should I have any questions or need assistance in obtaining follow up care.    __________________________________________  _____________  __________ Signature of Patient or Authorized Representative            Date                   Time    __________________________________________ Nurse's Signature    

## 2012-05-23 NOTE — Progress Notes (Signed)
1530---Patient states she feels good today, does not want to be sleepy. Offered Ativan PO as ordered, she states she really only needs that on her day 1 of treatment, which is longer.

## 2012-05-24 ENCOUNTER — Ambulatory Visit (HOSPITAL_BASED_OUTPATIENT_CLINIC_OR_DEPARTMENT_OTHER): Payer: Medicare Other

## 2012-05-24 ENCOUNTER — Telehealth: Payer: Self-pay

## 2012-05-24 ENCOUNTER — Other Ambulatory Visit: Payer: Medicare Other | Admitting: Lab

## 2012-05-24 VITALS — BP 150/77 | HR 71 | Temp 97.4°F

## 2012-05-24 DIAGNOSIS — C9110 Chronic lymphocytic leukemia of B-cell type not having achieved remission: Secondary | ICD-10-CM

## 2012-05-24 DIAGNOSIS — C9190 Lymphoid leukemia, unspecified not having achieved remission: Secondary | ICD-10-CM

## 2012-05-24 DIAGNOSIS — Z5189 Encounter for other specified aftercare: Secondary | ICD-10-CM

## 2012-05-24 MED ORDER — PEGFILGRASTIM INJECTION 6 MG/0.6ML
6.0000 mg | Freq: Once | SUBCUTANEOUS | Status: AC
  Administered 2012-05-24: 6 mg via SUBCUTANEOUS
  Filled 2012-05-24: qty 0.6

## 2012-05-24 NOTE — Telephone Encounter (Signed)
Told Erin Moore that Dr. Darrold Span said to increase fluid intake to at least 64 oz. of fluid a day.  No extra labs needed now.

## 2012-05-24 NOTE — Telephone Encounter (Signed)
Ms. Erin Moore calling to let Dr. Darrold Span know that Ms. Erin Moore has not been taking  The appropriate volume of fluid the last 2 weeks.  She has only been takeng in~16 oz of fluids.  Ms. Erin Moore only found this out yesterday.   Does Ms. Erin Moore need to come in for a uric acid test?

## 2012-06-13 ENCOUNTER — Other Ambulatory Visit: Payer: Medicare Other | Admitting: Lab

## 2012-06-14 ENCOUNTER — Encounter: Payer: Self-pay | Admitting: Oncology

## 2012-06-14 ENCOUNTER — Ambulatory Visit (HOSPITAL_BASED_OUTPATIENT_CLINIC_OR_DEPARTMENT_OTHER): Payer: Medicare Other | Admitting: Oncology

## 2012-06-14 ENCOUNTER — Other Ambulatory Visit (HOSPITAL_BASED_OUTPATIENT_CLINIC_OR_DEPARTMENT_OTHER): Payer: Medicare Other | Admitting: Lab

## 2012-06-14 ENCOUNTER — Telehealth: Payer: Self-pay | Admitting: Oncology

## 2012-06-14 VITALS — BP 150/80 | HR 76 | Temp 97.6°F | Resp 20 | Ht 63.0 in | Wt 235.7 lb

## 2012-06-14 DIAGNOSIS — C9190 Lymphoid leukemia, unspecified not having achieved remission: Secondary | ICD-10-CM

## 2012-06-14 DIAGNOSIS — C9110 Chronic lymphocytic leukemia of B-cell type not having achieved remission: Secondary | ICD-10-CM

## 2012-06-14 LAB — COMPREHENSIVE METABOLIC PANEL (CC13)
ALT: 22 U/L (ref 0–55)
AST: 19 U/L (ref 5–34)
Albumin: 4 g/dL (ref 3.5–5.0)
Alkaline Phosphatase: 120 U/L (ref 40–150)
BUN: 21 mg/dL (ref 7.0–26.0)
CO2: 32 mEq/L — ABNORMAL HIGH (ref 22–29)
Calcium: 9.3 mg/dL (ref 8.4–10.4)
Chloride: 103 mEq/L (ref 98–107)
Creatinine: 0.9 mg/dL (ref 0.6–1.1)
Glucose: 97 mg/dl (ref 70–99)
Potassium: 4.5 mEq/L (ref 3.5–5.1)
Sodium: 142 mEq/L (ref 136–145)
Total Bilirubin: 0.5 mg/dL (ref 0.20–1.20)
Total Protein: 5.9 g/dL — ABNORMAL LOW (ref 6.4–8.3)

## 2012-06-14 LAB — CBC WITH DIFFERENTIAL/PLATELET
BASO%: 0.2 % (ref 0.0–2.0)
Basophils Absolute: 0 10*3/uL (ref 0.0–0.1)
EOS%: 2.8 % (ref 0.0–7.0)
Eosinophils Absolute: 0.1 10*3/uL (ref 0.0–0.5)
HCT: 35.8 % (ref 34.8–46.6)
HGB: 12.5 g/dL (ref 11.6–15.9)
LYMPH%: 8.3 % — ABNORMAL LOW (ref 14.0–49.7)
MCH: 29.9 pg (ref 25.1–34.0)
MCHC: 35.1 g/dL (ref 31.5–36.0)
MCV: 85.1 fL (ref 79.5–101.0)
MONO#: 0.5 10*3/uL (ref 0.1–0.9)
MONO%: 10.2 % (ref 0.0–14.0)
NEUT#: 3.9 10*3/uL (ref 1.5–6.5)
NEUT%: 78.5 % — ABNORMAL HIGH (ref 38.4–76.8)
Platelets: 109 10*3/uL — ABNORMAL LOW (ref 145–400)
RBC: 4.2 10*6/uL (ref 3.70–5.45)
RDW: 15.9 % — ABNORMAL HIGH (ref 11.2–14.5)
WBC: 5 10*3/uL (ref 3.9–10.3)
lymph#: 0.4 10*3/uL — ABNORMAL LOW (ref 0.9–3.3)

## 2012-06-14 NOTE — Patient Instructions (Signed)
Appointments as scheduled. 

## 2012-06-14 NOTE — Telephone Encounter (Signed)
Gave pt appt for September 2013 chemo and Injection, October 2013 lab and MD

## 2012-06-14 NOTE — Progress Notes (Signed)
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 She has had no problems with treatments for last 3 cycles since she has been taking additional ativan prior to arrival at office.   CLL was diagnosed fall 2011 after she presented to primary MD with fatigue and had WBC ~ 13k. She had cervical lymph node biopsy in Sept 2011 and bone marrow exam Nov 2011 with cytogenetics done at [facility]. She did not have high risk cytogenetics. She did, however, have rapid doubling time and platelets decreaed to < 100k. She and sister were not interested in consideration of transplant then. She was treated with fludara/Rituxan x 6 cycles from Feb 2012 thru July 2012, which she tolerated generally well. Despite regular review of medications, we learned after treatment completed that she had not taken prophylactic TMP/SMX as instructed. She had partial response to the fludara/rituxan by labs and repeat US to evaluate spleen size. We have followed since July 2012 with observation. WBC had increased since November and platelets had not improved since treatment was completed. She was seen in consultation by Velta Addison at [facility] on 12-27-2011, with recommendation for bendamustine with rituxan with findings on bone marrow 01-13-2012 including 80 - 90 % cellularity with 89% lymphocytes and decreased megakaryocytes. She began Treanda/rituxan on 4-11 and 01-28-12, with rapid marked drop in WBC, fortunately without tumor lysis complications, and improvement in platelets after cycle 1. Patient again had mild rituxan reaction with cycle 2 despite capping rate of infusion; the reaction again began with nausea. More recently she has done will with extra ativan as above.  Patient has had no problems since last treatment, tho she has  some minimal scattered bruises without trauma that she can recall. She has had no fever or symptoms of infection. She denies shortness of breath, cough, any pain, no overt bleeding. Appetite and energy are good, no problems with bowels, no nausea., no noted adenopathy. Remainder of 10 point Review of Systems negative.  Objective:  Vital signs in last 24 hours:  BP 150/69  Pulse 76  Temp 97.6 F (36.4 C) (Oral)  Resp 20  Ht 5\' 3"  (1.6 m)  Wt 235 lb 11.2 oz (106.913 kg)  BMI 41.75 kg/m2 Weight is up ~ 6 lbs.Easily ambulatory, looks comfortable, talkative and very pleasant as always.  HEENT:PERRLA, sclera clear, anicteric and oropharynx clear, no lesions LymphaticsCervical, supraclavicular, and axillary nodes normal.No appreciable inguinal adenopathy. Resp: clear to auscultation bilaterally and normal percussion bilaterally Cardio: regular rate and rhythm GI: obese, soft, nontender, no appreciable HSM, normal bowel sounds Extremities: extremities normal, atraumatic, no cyanosis or edema Neuro: nonfocal Skin without rash or petechiae. 1 cm resolving ecchymosis right upper arm.  Lab Results:  Results for orders placed in visit on 06/14/12  CBC WITH DIFFERENTIAL      Component Value Range   WBC 5.0  3.9 - 10.3 10e3/uL   NEUT# 3.9  1.5 - 6.5 10e3/uL   HGB 12.5  11.6 - 15.9 g/dL   HCT 86.5  78.4 - 69.6 %   Platelets 109 (*) 145 - 400 10e3/uL   MCV 85.1  79.5 - 101.0 fL   MCH 29.9  25.1 - 34.0 pg   MCHC 35.1  31.5 - 36.0 g/dL   RBC 2.95  2.84 - 1.32 10e6/uL  RDW 15.9 (*) 11.2 - 14.5 %   lymph# 0.4 (*) 0.9 - 3.3 10e3/uL   MONO# 0.5  0.1 - 0.9 10e3/uL   Eosinophils Absolute 0.1  0.0 - 0.5 10e3/uL   Basophils Absolute 0.0  0.0 - 0.1 10e3/uL   NEUT% 78.5 (*) 38.4 - 76.8 %   LYMPH% 8.3 (*) 14.0 - 49.7 %   MONO% 10.2  0.0 - 14.0 %   EOS% 2.8  0.0 - 7.0 %   BASO% 0.2  0.0 - 2.0 %    CMET available after visit normal with exception of CO2 32 and Tprot 5.9 Studies/Results:  No  results found.  Medications: I have reviewed the patient's current medications.  Assessment/Plan: 1.CLL: history as above, doing well with present treatment with improvement in counts overall. Will give cycle 6 Treanda/ rituxan on 9-5 and 9-6 with neulasta 06-24-12. I will see her back with repeat labs in early Oct or sooner if needed. I expect to follow labs over next couple of months out from treatment; I do not know that she will need another bone marrow exam at this point. She is not presently scheduled back to Dr Rennis Harding at [facility]. She is on prophylactic acyclovir and we have used neulasta with each treatment. 2. Obesity 3.cognitive disability tho really seems very high functioning: sister is HCPOA and caregiver very helpful  4.elevated uric acid not related to CLL: on allopurinol 300 mg daily  Patient and caregiver were comfortable with plan as above. LIVESAY,LENNIS P, MD   06/14/2012, 11:23 AM

## 2012-06-22 ENCOUNTER — Ambulatory Visit (HOSPITAL_BASED_OUTPATIENT_CLINIC_OR_DEPARTMENT_OTHER): Payer: Medicare Other

## 2012-06-22 VITALS — BP 133/66 | HR 74 | Temp 97.6°F | Resp 18

## 2012-06-22 DIAGNOSIS — Z5111 Encounter for antineoplastic chemotherapy: Secondary | ICD-10-CM

## 2012-06-22 DIAGNOSIS — C9110 Chronic lymphocytic leukemia of B-cell type not having achieved remission: Secondary | ICD-10-CM

## 2012-06-22 DIAGNOSIS — Z5112 Encounter for antineoplastic immunotherapy: Secondary | ICD-10-CM

## 2012-06-22 DIAGNOSIS — C9190 Lymphoid leukemia, unspecified not having achieved remission: Secondary | ICD-10-CM

## 2012-06-22 MED ORDER — ACETAMINOPHEN 325 MG PO TABS
650.0000 mg | ORAL_TABLET | Freq: Once | ORAL | Status: AC
  Administered 2012-06-22: 650 mg via ORAL

## 2012-06-22 MED ORDER — SODIUM CHLORIDE 0.9 % IV SOLN
70.0000 mg/m2 | Freq: Once | INTRAVENOUS | Status: AC
  Administered 2012-06-22: 150 mg via INTRAVENOUS
  Filled 2012-06-22: qty 30

## 2012-06-22 MED ORDER — ONDANSETRON 16 MG/50ML IVPB (CHCC)
16.0000 mg | Freq: Once | INTRAVENOUS | Status: AC
  Administered 2012-06-22: 16 mg via INTRAVENOUS

## 2012-06-22 MED ORDER — LORAZEPAM 1 MG PO TABS
0.5000 mg | ORAL_TABLET | ORAL | Status: DC
  Administered 2012-06-22: 0.5 mg via ORAL

## 2012-06-22 MED ORDER — SODIUM CHLORIDE 0.9 % IV SOLN
375.0000 mg/m2 | Freq: Once | INTRAVENOUS | Status: AC
  Administered 2012-06-22: 800 mg via INTRAVENOUS
  Filled 2012-06-22: qty 80

## 2012-06-22 MED ORDER — SODIUM CHLORIDE 0.9 % IV SOLN
Freq: Once | INTRAVENOUS | Status: AC
  Administered 2012-06-22: 09:00:00 via INTRAVENOUS

## 2012-06-22 MED ORDER — DEXAMETHASONE SODIUM PHOSPHATE 10 MG/ML IJ SOLN
10.0000 mg | Freq: Once | INTRAMUSCULAR | Status: AC
  Administered 2012-06-22: 10 mg via INTRAVENOUS

## 2012-06-22 MED ORDER — DIPHENHYDRAMINE HCL 25 MG PO CAPS
50.0000 mg | ORAL_CAPSULE | Freq: Once | ORAL | Status: AC
  Administered 2012-06-22: 50 mg via ORAL

## 2012-06-22 NOTE — Patient Instructions (Addendum)
[facility] Discharge Instructions for Patients Receiving Chemotherapy  Today you received the following chemotherapy agents Rituxan and treanda To help prevent nausea and vomiting after your treatment, we encourage you to take your nausea medication   Take it as often as prescribed.   If you develop nausea and vomiting that is not controlled by your nausea medication, call the clinic. If it is after clinic hours your family physician or the after hours number for the clinic or go to the Emergency Department.   BELOW ARE SYMPTOMS THAT SHOULD BE REPORTED IMMEDIATELY:  *FEVER GREATER THAN 100.5 F  *CHILLS WITH OR WITHOUT FEVER  NAUSEA AND VOMITING THAT IS NOT CONTROLLED WITH YOUR NAUSEA MEDICATION  *UNUSUAL SHORTNESS OF BREATH  *UNUSUAL BRUISING OR BLEEDING  TENDERNESS IN MOUTH AND THROAT WITH OR WITHOUT PRESENCE OF ULCERS  *URINARY PROBLEMS  *BOWEL PROBLEMS  UNUSUAL RASH Items with * indicate a potential emergency and should be followed up as soon as possible.  If this is your first treatment one of the nurses will contact you 24 hours after your treatment. Please let the nurse know about any problems that you may have experienced. Feel free to call the clinic you have any questions or concerns. The clinic phone number is [phone].   I have been informed and understand all the instructions given to me. I know to contact the clinic, my physician, or go to the Emergency Department if any problems should occur. I do not have any questions at this time, but understand that I may call the clinic during office hours   should I have any questions or need assistance in obtaining follow up care.    __________________________________________  _____________  __________ Signature of Patient or Authorized Representative            Date                   Time    __________________________________________ Nurse's Signature

## 2012-06-23 ENCOUNTER — Ambulatory Visit (HOSPITAL_BASED_OUTPATIENT_CLINIC_OR_DEPARTMENT_OTHER): Payer: Medicare Other

## 2012-06-23 VITALS — BP 157/82 | HR 84 | Temp 97.0°F

## 2012-06-23 DIAGNOSIS — C9110 Chronic lymphocytic leukemia of B-cell type not having achieved remission: Secondary | ICD-10-CM

## 2012-06-23 DIAGNOSIS — C9190 Lymphoid leukemia, unspecified not having achieved remission: Secondary | ICD-10-CM

## 2012-06-23 DIAGNOSIS — Z5111 Encounter for antineoplastic chemotherapy: Secondary | ICD-10-CM

## 2012-06-23 MED ORDER — ONDANSETRON 16 MG/50ML IVPB (CHCC)
16.0000 mg | Freq: Once | INTRAVENOUS | Status: AC
  Administered 2012-06-23: 16 mg via INTRAVENOUS

## 2012-06-23 MED ORDER — SODIUM CHLORIDE 0.9 % IV SOLN
70.0000 mg/m2 | Freq: Once | INTRAVENOUS | Status: AC
  Administered 2012-06-23: 150 mg via INTRAVENOUS
  Filled 2012-06-23: qty 30

## 2012-06-23 MED ORDER — SODIUM CHLORIDE 0.9 % IV SOLN
Freq: Once | INTRAVENOUS | Status: AC
  Administered 2012-06-23: 09:00:00 via INTRAVENOUS

## 2012-06-23 MED ORDER — DEXAMETHASONE SODIUM PHOSPHATE 10 MG/ML IJ SOLN
10.0000 mg | Freq: Once | INTRAMUSCULAR | Status: AC
  Administered 2012-06-23: 10 mg via INTRAVENOUS

## 2012-06-23 NOTE — Patient Instructions (Signed)
[facility] Discharge Instructions for Patients Receiving Chemotherapy  Today you received the following chemotherapy agents Treanda To help prevent nausea and vomiting after your treatment, we encourage you to take your nausea medication as prescribed.If you develop nausea and vomiting that is not controlled by your nausea medication, call the clinic. If it is after clinic hours your family physician or the after hours number for the clinic or go to the Emergency Department.   BELOW ARE SYMPTOMS THAT SHOULD BE REPORTED IMMEDIATELY:  *FEVER GREATER THAN 100.5 F  *CHILLS WITH OR WITHOUT FEVER  NAUSEA AND VOMITING THAT IS NOT CONTROLLED WITH YOUR NAUSEA MEDICATION  *UNUSUAL SHORTNESS OF BREATH  *UNUSUAL BRUISING OR BLEEDING  TENDERNESS IN MOUTH AND THROAT WITH OR WITHOUT PRESENCE OF ULCERS  *URINARY PROBLEMS  *BOWEL PROBLEMS  UNUSUAL RASH Items with * indicate a potential emergency and should be followed up as soon as possible.  One of the nurses will contact you 24 hours after your treatment. Please let the nurse know about any problems that you may have experienced. Feel free to call the clinic you have any questions or concerns. The clinic phone number is [phone].   I have been informed and understand all the instructions given to me. I know to contact the clinic, my physician, or go to the Emergency Department if any problems should occur. I do not have any questions at this time, but understand that I may call the clinic during office hours   should I have any questions or need assistance in obtaining follow up care.    __________________________________________  _____________  __________ Signature of Patient or Authorized Representative            Date                   Time    __________________________________________ Nurse's Signature    

## 2012-06-24 ENCOUNTER — Ambulatory Visit (HOSPITAL_BASED_OUTPATIENT_CLINIC_OR_DEPARTMENT_OTHER): Payer: Medicare Other

## 2012-06-24 VITALS — BP 175/81 | HR 69 | Temp 97.9°F

## 2012-06-24 DIAGNOSIS — C9190 Lymphoid leukemia, unspecified not having achieved remission: Secondary | ICD-10-CM

## 2012-06-24 DIAGNOSIS — C9110 Chronic lymphocytic leukemia of B-cell type not having achieved remission: Secondary | ICD-10-CM

## 2012-06-24 MED ORDER — PEGFILGRASTIM INJECTION 6 MG/0.6ML
6.0000 mg | Freq: Once | SUBCUTANEOUS | Status: AC
  Administered 2012-06-24: 6 mg via SUBCUTANEOUS

## 2012-06-29 ENCOUNTER — Other Ambulatory Visit: Payer: Self-pay | Admitting: Oncology

## 2012-06-29 DIAGNOSIS — C9110 Chronic lymphocytic leukemia of B-cell type not having achieved remission: Secondary | ICD-10-CM

## 2012-07-26 ENCOUNTER — Telehealth: Payer: Self-pay | Admitting: Oncology

## 2012-07-26 ENCOUNTER — Other Ambulatory Visit (HOSPITAL_BASED_OUTPATIENT_CLINIC_OR_DEPARTMENT_OTHER): Payer: Medicare Other

## 2012-07-26 ENCOUNTER — Encounter: Payer: Self-pay | Admitting: Oncology

## 2012-07-26 ENCOUNTER — Other Ambulatory Visit: Payer: Self-pay | Admitting: *Deleted

## 2012-07-26 ENCOUNTER — Ambulatory Visit (HOSPITAL_BASED_OUTPATIENT_CLINIC_OR_DEPARTMENT_OTHER): Payer: Medicare Other | Admitting: Oncology

## 2012-07-26 VITALS — BP 121/69 | HR 69 | Temp 97.5°F | Resp 20 | Ht 63.0 in | Wt 235.3 lb

## 2012-07-26 DIAGNOSIS — I10 Essential (primary) hypertension: Secondary | ICD-10-CM

## 2012-07-26 DIAGNOSIS — C9110 Chronic lymphocytic leukemia of B-cell type not having achieved remission: Secondary | ICD-10-CM

## 2012-07-26 DIAGNOSIS — Z23 Encounter for immunization: Secondary | ICD-10-CM

## 2012-07-26 DIAGNOSIS — C9190 Lymphoid leukemia, unspecified not having achieved remission: Secondary | ICD-10-CM

## 2012-07-26 LAB — COMPREHENSIVE METABOLIC PANEL (CC13)
ALT: 25 U/L (ref 0–55)
AST: 21 U/L (ref 5–34)
Albumin: 4.1 g/dL (ref 3.5–5.0)
Alkaline Phosphatase: 113 U/L (ref 40–150)
BUN: 16 mg/dL (ref 7.0–26.0)
CO2: 29 mEq/L (ref 22–29)
Calcium: 9.6 mg/dL (ref 8.4–10.4)
Chloride: 102 mEq/L (ref 98–107)
Creatinine: 0.9 mg/dL (ref 0.6–1.1)
Glucose: 92 mg/dl (ref 70–99)
Potassium: 4.2 mEq/L (ref 3.5–5.1)
Sodium: 141 mEq/L (ref 136–145)
Total Bilirubin: 0.7 mg/dL (ref 0.20–1.20)
Total Protein: 6.1 g/dL — ABNORMAL LOW (ref 6.4–8.3)

## 2012-07-26 LAB — CBC WITH DIFFERENTIAL/PLATELET
BASO%: 0.5 % (ref 0.0–2.0)
Basophils Absolute: 0 10*3/uL (ref 0.0–0.1)
EOS%: 2.3 % (ref 0.0–7.0)
Eosinophils Absolute: 0.1 10*3/uL (ref 0.0–0.5)
HCT: 35.6 % (ref 34.8–46.6)
HGB: 12.4 g/dL (ref 11.6–15.9)
LYMPH%: 9.2 % — ABNORMAL LOW (ref 14.0–49.7)
MCH: 29.5 pg (ref 25.1–34.0)
MCHC: 34.8 g/dL (ref 31.5–36.0)
MCV: 84.6 fL (ref 79.5–101.0)
MONO#: 0.5 10*3/uL (ref 0.1–0.9)
MONO%: 12 % (ref 0.0–14.0)
NEUT#: 3 10*3/uL (ref 1.5–6.5)
NEUT%: 76 % (ref 38.4–76.8)
Platelets: 127 10*3/uL — ABNORMAL LOW (ref 145–400)
RBC: 4.21 10*6/uL (ref 3.70–5.45)
RDW: 14.4 % (ref 11.2–14.5)
WBC: 3.9 10*3/uL (ref 3.9–10.3)
lymph#: 0.4 10*3/uL — ABNORMAL LOW (ref 0.9–3.3)

## 2012-07-26 LAB — URIC ACID (CC13): Uric Acid, Serum: 4.3 mg/dl (ref 2.6–7.4)

## 2012-07-26 LAB — LACTATE DEHYDROGENASE (CC13): LDH: 139 U/L (ref 125–220)

## 2012-07-26 MED ORDER — ACYCLOVIR 400 MG PO TABS: 400.0000 mg | ORAL_TABLET | Freq: Every day | ORAL | Status: DC

## 2012-07-26 MED ORDER — INFLUENZA VIRUS VACC SPLIT PF IM SUSP
0.5000 mL | INTRAMUSCULAR | Status: AC
  Administered 2012-07-26: 0.5 mL via INTRAMUSCULAR
  Filled 2012-07-26: qty 0.5

## 2012-07-26 NOTE — Patient Instructions (Signed)
Flu shot given today

## 2012-07-26 NOTE — Progress Notes (Signed)
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 She did well with that treatment and has felt very well recently. She does not have PAC.  CLL was diagnosed fall 2011 after she presented to primary MD with fatigue and had WBC ~ 13k. She had cervical lymph node biopsy in Sept 2011 and bone marrow exam Nov 2011 with cytogenetics done at [facility]. She did not have high risk cytogenetics. She did, however, have rapid doubling time and platelets decreaed to < 100k. She and sister were not interested in consideration of transplant then. She was treated with fludara/Rituxan x 6 cycles from Feb 2012 thru July 2012, which she tolerated generally well. Despite regular review of medications, we learned after treatment completed that she had not taken prophylactic TMP/SMX as instructed. She had partial response to the fludara/rituxan by labs and repeat US to evaluate spleen size. We have followed since July 2012 with observation. WBC had increased since November and platelets had not improved since treatment was completed. She was seen in consultation by Velta Addison at [facility] on 12-27-2011, with recommendation for bendamustine with rituxan with findings on bone marrow 01-13-2012 including 80 - 90 % cellularity with 89% lymphocytes and decreased megakaryocytes. She began Treanda/rituxan on 4-11 and 01-28-12, with rapid marked drop in WBC, fortunately without tumor lysis complications, and improvement in platelets after cycle 1. Patient again had mild rituxan reaction with cycle 2 despite capping rate of infusion; the reaction again began with nausea. More recently she has done will with extra ativan prior to treatment. She is not presently scheduled back to Dr Rennis Harding.  Patient has had good energy overall, tho  she is deconditioned and overweight. She has requested handicapped parking papers, however I have encouraged her to walk and did not fill out these forms. She is working with trainer 3 days per week. She has had no fever or symptoms of infection, no pain, no excessive bruising and no bleeding. She has no respiratory symptoms. Appetite is good tho she has "burning mouth" with spicy foods, no mucositis symptoms otherwise. She is not aware of adenopathy. She has minimal sinus drainage now, with environmental allergies. Bowels and bladder are fine. She is able to sleep well. Remainder of 10 point Review of Systems negative.  Objective:  Vital signs in last 24 hours:  BP 158/75  Pulse 64  Temp 97.5 F (36.4 C) (Oral)  Resp 20  Ht 5\' 3"  (1.6 m)  Wt 235 lb 4.8 oz (106.731 kg)  BMI 41.68 kg/m2 Weight is down ~ 0.5 lbs. Easily ambulatory, respirations not labored RA. Talkative and cheerful.   HEENT:PERRLA, sclera clear, anicteric, oropharynx clear, no lesions and neck supple with midline trachea LymphaticsCervical, supraclavicular, and axillary nodes normal. Resp: clear to auscultation bilaterally and normal percussion bilaterally Cardio: regular rate and rhythm GI: obese, soft, nothing palpable, normal bowel sounds Extremities: no pititing edema, cords, tenderness Neuro: unchanged Skin with single small, resolving ecchymosis right anterior abdomen No problems at most recent IV site  Lab Results:  Results for orders placed in visit on 07/26/12  CBC WITH DIFFERENTIAL      Component Value Range   WBC 3.9  3.9 - 10.3 10e3/uL   NEUT# 3.0  1.5 - 6.5 10e3/uL   HGB 12.4  11.6 - 15.9 g/dL  HCT 35.6  34.8 - 46.6 %   Platelets 127 (*) 145 - 400 10e3/uL   MCV 84.6  79.5 - 101.0 fL   MCH 29.5  25.1 - 34.0 pg   MCHC 34.8  31.5 - 36.0 g/dL   RBC 6.21  3.08 - 6.57 10e6/uL   RDW 14.4  11.2 - 14.5 %   lymph# 0.4 (*) 0.9 - 3.3 10e3/uL   MONO# 0.5  0.1 - 0.9 10e3/uL   Eosinophils Absolute 0.1  0.0 -  0.5 10e3/uL   Basophils Absolute 0.0  0.0 - 0.1 10e3/uL   NEUT% 76.0  38.4 - 76.8 %   LYMPH% 9.2 (*) 14.0 - 49.7 %   MONO% 12.0  0.0 - 14.0 %   EOS% 2.3  0.0 - 7.0 %   BASO% 0.5  0.0 - 2.0 %     Studies/Results:  No results found.  Medications: I have reviewed the patient's current medications. She has had flu vaccine today  Assessment/Plan: 1. Stage IV CLL: counts further improved today. I expect to follow over next few months, possible repeat US for spleen size. I will see her back ~ late Nov/ early Dec or sooner if needed. Continued exercise encouraged. 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     LIVESAY,LENNIS P, MD   07/26/2012, 10:39 AM

## 2012-07-26 NOTE — Telephone Encounter (Signed)
Printed and gv pt appt schedule for Dec.

## 2012-07-27 ENCOUNTER — Other Ambulatory Visit: Payer: Self-pay | Admitting: Oncology

## 2012-08-07 ENCOUNTER — Telehealth: Payer: Self-pay

## 2012-08-07 NOTE — Telephone Encounter (Signed)
Ms. Erin Moore's BP  this am was 138/61. P= 55 which is low for her.  She is cc of dizziness when she stands up.  Told her that she needs to call PCP Dr. Mick Sell who manages her blood pressure.   Ms. Erin Moore verbalized understanding.

## 2012-08-18 ENCOUNTER — Other Ambulatory Visit: Payer: Self-pay | Admitting: Oncology

## 2012-08-18 DIAGNOSIS — C9110 Chronic lymphocytic leukemia of B-cell type not having achieved remission: Secondary | ICD-10-CM

## 2012-09-20 ENCOUNTER — Telehealth: Payer: Self-pay | Admitting: Oncology

## 2012-09-20 ENCOUNTER — Other Ambulatory Visit (HOSPITAL_BASED_OUTPATIENT_CLINIC_OR_DEPARTMENT_OTHER): Payer: Medicare Other | Admitting: Lab

## 2012-09-20 ENCOUNTER — Encounter: Payer: Self-pay | Admitting: Oncology

## 2012-09-20 ENCOUNTER — Other Ambulatory Visit: Payer: Self-pay | Admitting: Oncology

## 2012-09-20 ENCOUNTER — Ambulatory Visit (HOSPITAL_BASED_OUTPATIENT_CLINIC_OR_DEPARTMENT_OTHER): Payer: Medicare Other | Admitting: Oncology

## 2012-09-20 VITALS — BP 148/64 | HR 76 | Temp 97.7°F | Resp 18 | Wt 243.1 lb

## 2012-09-20 DIAGNOSIS — C9190 Lymphoid leukemia, unspecified not having achieved remission: Secondary | ICD-10-CM

## 2012-09-20 DIAGNOSIS — C9110 Chronic lymphocytic leukemia of B-cell type not having achieved remission: Secondary | ICD-10-CM

## 2012-09-20 LAB — COMPREHENSIVE METABOLIC PANEL (CC13)
ALT: 28 U/L (ref 0–55)
AST: 20 U/L (ref 5–34)
Albumin: 3.9 g/dL (ref 3.5–5.0)
Alkaline Phosphatase: 99 U/L (ref 40–150)
BUN: 22 mg/dL (ref 7.0–26.0)
CO2: 32 mEq/L — ABNORMAL HIGH (ref 22–29)
Calcium: 9.6 mg/dL (ref 8.4–10.4)
Chloride: 102 mEq/L (ref 98–107)
Creatinine: 1 mg/dL (ref 0.6–1.1)
Glucose: 100 mg/dl — ABNORMAL HIGH (ref 70–99)
Potassium: 4.2 mEq/L (ref 3.5–5.1)
Sodium: 144 mEq/L (ref 136–145)
Total Bilirubin: 0.47 mg/dL (ref 0.20–1.20)
Total Protein: 6.1 g/dL — ABNORMAL LOW (ref 6.4–8.3)

## 2012-09-20 LAB — CBC WITH DIFFERENTIAL/PLATELET
BASO%: 0.5 % (ref 0.0–2.0)
Basophils Absolute: 0 10*3/uL (ref 0.0–0.1)
EOS%: 2.6 % (ref 0.0–7.0)
Eosinophils Absolute: 0.1 10*3/uL (ref 0.0–0.5)
HCT: 37.5 % (ref 34.8–46.6)
HGB: 13.2 g/dL (ref 11.6–15.9)
LYMPH%: 11.6 % — ABNORMAL LOW (ref 14.0–49.7)
MCH: 30.4 pg (ref 25.1–34.0)
MCHC: 35.2 g/dL (ref 31.5–36.0)
MCV: 86.3 fL (ref 79.5–101.0)
MONO#: 0.4 10*3/uL (ref 0.1–0.9)
MONO%: 8.5 % (ref 0.0–14.0)
NEUT#: 3.8 10*3/uL (ref 1.5–6.5)
NEUT%: 76.8 % (ref 38.4–76.8)
Platelets: 128 10*3/uL — ABNORMAL LOW (ref 145–400)
RBC: 4.34 10*6/uL (ref 3.70–5.45)
RDW: 13.5 % (ref 11.2–14.5)
WBC: 4.9 10*3/uL (ref 3.9–10.3)
lymph#: 0.6 10*3/uL — ABNORMAL LOW (ref 0.9–3.3)

## 2012-09-20 LAB — MORPHOLOGY: PLT EST: DECREASED

## 2012-09-20 LAB — LACTATE DEHYDROGENASE (CC13): LDH: 130 U/L (ref 125–245)

## 2012-09-20 NOTE — Patient Instructions (Signed)
Call if needed prior to next scheduled visit.  Check BP at home and call Dr Paulino Rily if stays up

## 2012-09-20 NOTE — Progress Notes (Signed)
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  CLL was diagnosed fall 2011 after she presented to primary MD with fatigue and had WBC ~ 13k. She had cervical lymph node biopsy in Sept 2011 and bone marrow exam Nov 2011 with cytogenetics done at [facility]. She did not have high risk cytogenetics. She did, however, have rapid doubling time and platelets decreaed to < 100k. She and sister were not interested in consideration of transplant then. She was treated with fludara/Rituxan x 6 cycles from Feb 2012 thru July 2012, which she tolerated generally well. Despite regular review of medications, we learned after treatment completed that she had not taken prophylactic TMP/SMX as instructed. She had partial response to the fludara/rituxan by labs and repeat US to evaluate spleen size. We have followed since July 2012 with observation. WBC had increased since November and platelets had not improved since treatment was completed. She was seen in consultation by Velta Addison at [facility] on 12-27-2011, with recommendation for bendamustine with rituxan with findings on bone marrow 01-13-2012 including 80 - 90 % cellularity with 89% lymphocytes and decreased megakaryocytes. She began Treanda/rituxan on 4-11 and 01-28-12, with rapid marked drop in WBC, fortunately without tumor lysis complications, and improvement in platelets after cycle 1. Patient again had mild rituxan reaction with cycle 2 despite capping rate of infusion; the reaction again began with nausea. She had no further difficulty after using ativan prior to coming for treatment; she was given neulasta with all of these treatments as she was not compliant previously with infection prophylaxis.  Patient tells me that she has been feeling entirely well, with  lots of activities and regular schedule at gym. She has been eating more with holidays. She has had no fever or symptoms of infection, no shortness of breath, no pain, no bleeding or bruising. Remainder of 10 point Review of Systems negative.  Objective:  Vital signs in last 24 hours:  BP 148/64  Pulse 76  Temp 97.7 F (36.5 C)  Resp 18  Wt 243 lb 2 oz (110.281 kg) Easily ambulatory, looks comfortable. Weight is up 8 lbs from October.   HEENT:PERRLA, sclera clear, anicteric and oropharynx clear, no lesions LymphaticsCervical, supraclavicular, and axillary nodes normal. Cannot appreciate any inguinal adenopathy Resp: clear to auscultation bilaterally and normal percussion bilaterally Cardio: regular rate and rhythm GI: soft, non-tender; bowel sounds normal; no masses,  no organomegaly Obese. Extremities: extremities normal, atraumatic, no cyanosis or edema Neuro:nonfocal Skin without rash or ecchymoses/ petechiae  Lab Results:  Results for orders placed in visit on 09/20/12  CBC WITH DIFFERENTIAL      Component Value Range   WBC 4.9  3.9 - 10.3 10e3/uL   NEUT# 3.8  1.5 - 6.5 10e3/uL   HGB 13.2  11.6 - 15.9 g/dL   HCT 21.3  08.6 - 57.8 %   Platelets 128 (*) 145 - 400 10e3/uL   MCV 86.3  79.5 - 101.0 fL   MCH 30.4  25.1 - 34.0 pg   MCHC 35.2  31.5 - 36.0 g/dL   RBC 4.69  6.29 - 5.28 10e6/uL   RDW 13.5  11.2 - 14.5 %   lymph# 0.6 (*) 0.9 - 3.3 10e3/uL   MONO# 0.4  0.1 - 0.9 10e3/uL   Eosinophils Absolute 0.1  0.0 - 0.5  10e3/uL   Basophils Absolute 0.0  0.0 - 0.1 10e3/uL   NEUT% 76.8  38.4 - 76.8 %   LYMPH% 11.6 (*) 14.0 - 49.7 %   MONO% 8.5  0.0 - 14.0 %   EOS% 2.6  0.0 - 7.0 %   BASO% 0.5  0.0 - 2.0 %    CMET normal except CO2 32, glucose 100, Tprot 6.1 LDH 130 Studies/Results:  No results found.  Medications: I have reviewed the patient's current medications.  Assessment/Plan: 1.CLL: history as above, continuing observation. I will see her back in ~ 2 months  or sooner if needed. 2.morbid obesity 3.HTN, on medication 4.flu vaccine done   Adamarys Shall P, MD   09/20/2012, 10:09 AM

## 2012-09-20 NOTE — Telephone Encounter (Signed)
gv pt appt schedule for January 2014.  °

## 2012-09-26 ENCOUNTER — Other Ambulatory Visit: Payer: Self-pay

## 2012-09-26 DIAGNOSIS — C9110 Chronic lymphocytic leukemia of B-cell type not having achieved remission: Secondary | ICD-10-CM

## 2012-09-26 MED ORDER — ACYCLOVIR 400 MG PO TABS: 400.0000 mg | ORAL_TABLET | Freq: Every day | ORAL | Status: DC

## 2012-09-26 NOTE — Progress Notes (Signed)
Prescription was original date of 07-18-12 per [facility] so no refills.  Called in new prescription today and is on hold for patient until January.  Caregiver Renee Rival aware prescription is ready for p/u and a new one is on hold.

## 2012-11-14 ENCOUNTER — Telehealth: Payer: Self-pay | Admitting: Oncology

## 2012-11-14 ENCOUNTER — Other Ambulatory Visit (HOSPITAL_BASED_OUTPATIENT_CLINIC_OR_DEPARTMENT_OTHER): Payer: Medicare Other | Admitting: Lab

## 2012-11-14 ENCOUNTER — Encounter: Payer: Self-pay | Admitting: Oncology

## 2012-11-14 ENCOUNTER — Ambulatory Visit (HOSPITAL_BASED_OUTPATIENT_CLINIC_OR_DEPARTMENT_OTHER): Payer: Medicare Other | Admitting: Oncology

## 2012-11-14 VITALS — BP 125/66 | HR 66 | Temp 97.3°F | Resp 20 | Ht 63.0 in | Wt 238.4 lb

## 2012-11-14 DIAGNOSIS — C9110 Chronic lymphocytic leukemia of B-cell type not having achieved remission: Secondary | ICD-10-CM

## 2012-11-14 DIAGNOSIS — C9190 Lymphoid leukemia, unspecified not having achieved remission: Secondary | ICD-10-CM

## 2012-11-14 DIAGNOSIS — I10 Essential (primary) hypertension: Secondary | ICD-10-CM

## 2012-11-14 LAB — CBC WITH DIFFERENTIAL/PLATELET
BASO%: 0.6 % (ref 0.0–2.0)
Basophils Absolute: 0 10*3/uL (ref 0.0–0.1)
EOS%: 2.1 % (ref 0.0–7.0)
Eosinophils Absolute: 0.1 10*3/uL (ref 0.0–0.5)
HCT: 37.9 % (ref 34.8–46.6)
HGB: 13.4 g/dL (ref 11.6–15.9)
LYMPH%: 21.3 % (ref 14.0–49.7)
MCH: 29.9 pg (ref 25.1–34.0)
MCHC: 35.3 g/dL (ref 31.5–36.0)
MCV: 84.8 fL (ref 79.5–101.0)
MONO#: 0.4 10*3/uL (ref 0.1–0.9)
MONO%: 8.3 % (ref 0.0–14.0)
NEUT#: 3.5 10*3/uL (ref 1.5–6.5)
NEUT%: 67.7 % (ref 38.4–76.8)
Platelets: 118 10*3/uL — ABNORMAL LOW (ref 145–400)
RBC: 4.47 10*6/uL (ref 3.70–5.45)
RDW: 14.4 % (ref 11.2–14.5)
WBC: 5.2 10*3/uL (ref 3.9–10.3)
lymph#: 1.1 10*3/uL (ref 0.9–3.3)

## 2012-11-14 LAB — MORPHOLOGY: PLT EST: DECREASED

## 2012-11-14 LAB — COMPREHENSIVE METABOLIC PANEL (CC13)
ALT: 19 U/L (ref 0–55)
AST: 16 U/L (ref 5–34)
Albumin: 3.9 g/dL (ref 3.5–5.0)
Alkaline Phosphatase: 89 U/L (ref 40–150)
BUN: 22.4 mg/dL (ref 7.0–26.0)
CO2: 29 mEq/L (ref 22–29)
Calcium: 10 mg/dL (ref 8.4–10.4)
Chloride: 102 mEq/L (ref 98–107)
Creatinine: 1 mg/dL (ref 0.6–1.1)
Glucose: 102 mg/dl — ABNORMAL HIGH (ref 70–99)
Potassium: 4.2 mEq/L (ref 3.5–5.1)
Sodium: 141 mEq/L (ref 136–145)
Total Bilirubin: 0.44 mg/dL (ref 0.20–1.20)
Total Protein: 6.5 g/dL (ref 6.4–8.3)

## 2012-11-14 LAB — LACTATE DEHYDROGENASE (CC13): LDH: 140 U/L (ref 125–245)

## 2012-11-14 NOTE — Patient Instructions (Signed)
Call if needed prior to return appointment in  2 months

## 2012-11-14 NOTE — Telephone Encounter (Signed)
Gave pt appt for lab and MD on March 2014

## 2012-11-14 NOTE — Progress Notes (Signed)
OFFICE PROGRESS NOTE   11/14/2012   Physicians:S.Wolters, L.Rennis Harding   INTERVAL HISTORY:   Patient is seen, together with caregiver, in scheduled follow up of her stage IV CLL, most recently treated with treanda/ rituxan x 6 cycles thru 06-23-2012, now on observation. She does not have central catheter.   CLL was diagnosed fall 2011 after she presented to primary MD with fatigue and had WBC ~ 13k. She had cervical lymph node biopsy in Sept 2011 and bone marrow exam Nov 2011 with cytogenetics done at [facility]. She did not have high risk cytogenetics. She did, however, have rapid doubling time and platelets decreaed to < 100k. She and sister were not interested in consideration of transplant then. She was treated with fludara/Rituxan x 6 cycles from Feb 2012 thru July 2012, which she tolerated generally well. Despite regular review of medications, we learned after treatment completed that she had not taken prophylactic TMP/SMX as instructed. She had partial response to the fludara/rituxan by labs and repeat US to evaluate spleen size. We have followed since July 2012 with observation. WBC had increased since November and platelets had not improved since treatment was completed. She was seen in consultation by Velta Addison at [facility] on 12-27-2011, with recommendation for bendamustine with rituxan with findings on bone marrow 01-13-2012 including 80 - 90 % cellularity with 89% lymphocytes and decreased megakaryocytes. She began Treanda/rituxan on 4-11 and 01-28-12, with rapid marked drop in WBC, fortunately without tumor lysis complications, and improvement in platelets after cycle 1. Patient again had mild rituxan reaction with cycle 2 despite capping rate of infusion; the reaction again began with nausea. She had no further difficulty after using ativan prior to coming for treatment; she was given neulasta with all of these treatments as she was not compliant previously with infection  prophylaxis  Patient has been feeling very well since she was here last, with good energy and appetite, no infectious symptoms, no bleeding, no symptomatic adenopathy. She gets occasional bruises that are slow to resolve. She is walking at least 2 miles daily and exercising at gym 3x per week, and has been more careful with diet. Her balance has improved per the trainer. Remainder of 10 point Review of Systems negative.  Objective:  Vital signs in last 24 hours:  BP 143/78  Pulse 71  Temp 97.3 F (36.3 C) (Oral)  Resp 20  Ht 5\' 3"  (1.6 m)  Wt 238 lb 6.4 oz (108.138 kg)  BMI 42.23 kg/m2 Repeat BP 125/66. Weight is down 5 lbs, tho some question re accuracy of scales. Alert, looks comfortable, respirations not labored, easily ambulatory.  HEENT:PERRLA, sclera clear, anicteric, oropharynx clear, no lesions and neck supple with midline trachea LymphaticsCervical, supraclavicular, and axillary nodes normal. Resp: clear to auscultation bilaterally and normal percussion bilaterally Cardio: regular rate and rhythm GI: obese, soft, nontender, nothing palpable. Extremities: extremities normal, atraumatic, no cyanosis or edema Neuro:nonfocal Breast:normal without suspicious masses, skin or nipple changes or axillary nodes Skin with small resolving ecchymoses right upper breast and left mid abdomen. No rash.  Lab Results:  Results for orders placed in visit on 11/14/12  CBC WITH DIFFERENTIAL      Component Value Range   WBC 5.2  3.9 - 10.3 10e3/uL   NEUT# 3.5  1.5 - 6.5 10e3/uL   HGB 13.4  11.6 - 15.9 g/dL   HCT 91.4  78.2 - 95.6 %   Platelets 118 (*) 145 - 400 10e3/uL   MCV 84.8  79.5 - 101.0  fL   MCH 29.9  25.1 - 34.0 pg   MCHC 35.3  31.5 - 36.0 g/dL   RBC 1.61  0.96 - 0.45 10e6/uL   RDW 14.4  11.2 - 14.5 %   lymph# 1.1  0.9 - 3.3 10e3/uL   MONO# 0.4  0.1 - 0.9 10e3/uL   Eosinophils Absolute 0.1  0.0 - 0.5 10e3/uL   Basophils Absolute 0.0  0.0 - 0.1 10e3/uL   NEUT% 67.7  38.4 -  76.8 %   LYMPH% 21.3  14.0 - 49.7 %   MONO% 8.3  0.0 - 14.0 %   EOS% 2.1  0.0 - 7.0 %   BASO% 0.6  0.0 - 2.0 %  MORPHOLOGY      Component Value Range   Ovalocytes Few  Negative   White Cell Comments Variant Lymphs, Rare Myelocyte     PLT EST Decreased  Adequate    CMET available after visit normal with exception of glucose on 102  Studies/Results:  Last CT AP 11-2011 Last mammograms  04-14-12  Medications: I have reviewed the patient's current medications.  Assessment/Plan:  1.CLL: history as above, continuing observation. I will see her back in ~ 2 months or sooner if needed. Platelets are very slightly lower today, not symptomatic. She did not have ITP with progression of disease last year, has had splenomegaly. 2.morbid obesity: again exercising and following diet 3.HTN, on medication: I have told her that she does not need to check BP twice daily now, but good to check 2-3x per week. 4.flu vaccine done  Next apt with Dr Paulino Rily is in July.  Patient and caregiver followed discussion well and were in agreement with plan above. They know that they can call at any time if concerns prior to next visit.   Jamale Spangler P, MD   11/14/2012, 11:19 AM

## 2013-01-10 ENCOUNTER — Ambulatory Visit (HOSPITAL_BASED_OUTPATIENT_CLINIC_OR_DEPARTMENT_OTHER): Payer: Medicare Other | Admitting: Oncology

## 2013-01-10 ENCOUNTER — Telehealth: Payer: Self-pay | Admitting: Oncology

## 2013-01-10 ENCOUNTER — Other Ambulatory Visit (HOSPITAL_BASED_OUTPATIENT_CLINIC_OR_DEPARTMENT_OTHER): Payer: Medicare Other | Admitting: Lab

## 2013-01-10 VITALS — BP 126/62 | HR 73 | Temp 97.1°F | Resp 20 | Ht 63.0 in | Wt 241.7 lb

## 2013-01-10 DIAGNOSIS — E669 Obesity, unspecified: Secondary | ICD-10-CM

## 2013-01-10 DIAGNOSIS — C9110 Chronic lymphocytic leukemia of B-cell type not having achieved remission: Secondary | ICD-10-CM

## 2013-01-10 DIAGNOSIS — C9190 Lymphoid leukemia, unspecified not having achieved remission: Secondary | ICD-10-CM

## 2013-01-10 DIAGNOSIS — I10 Essential (primary) hypertension: Secondary | ICD-10-CM

## 2013-01-10 LAB — CBC WITH DIFFERENTIAL/PLATELET
BASO%: 0.5 % (ref 0.0–2.0)
Basophils Absolute: 0 10*3/uL (ref 0.0–0.1)
EOS%: 1.7 % (ref 0.0–7.0)
Eosinophils Absolute: 0.1 10*3/uL (ref 0.0–0.5)
HCT: 38.8 % (ref 34.8–46.6)
HGB: 13.6 g/dL (ref 11.6–15.9)
LYMPH%: 30.4 % (ref 14.0–49.7)
MCH: 29.6 pg (ref 25.1–34.0)
MCHC: 35.1 g/dL (ref 31.5–36.0)
MCV: 84.4 fL (ref 79.5–101.0)
MONO#: 0.5 10*3/uL (ref 0.1–0.9)
MONO%: 8 % (ref 0.0–14.0)
NEUT#: 3.7 10*3/uL (ref 1.5–6.5)
NEUT%: 59.4 % (ref 38.4–76.8)
Platelets: 116 10*3/uL — ABNORMAL LOW (ref 145–400)
RBC: 4.6 10*6/uL (ref 3.70–5.45)
RDW: 14.4 % (ref 11.2–14.5)
WBC: 6.2 10*3/uL (ref 3.9–10.3)
lymph#: 1.9 10*3/uL (ref 0.9–3.3)

## 2013-01-10 LAB — COMPREHENSIVE METABOLIC PANEL (CC13)
ALT: 28 U/L (ref 0–55)
AST: 17 U/L (ref 5–34)
Albumin: 4 g/dL (ref 3.5–5.0)
Alkaline Phosphatase: 87 U/L (ref 40–150)
BUN: 27 mg/dL — ABNORMAL HIGH (ref 7.0–26.0)
CO2: 32 mEq/L — ABNORMAL HIGH (ref 22–29)
Calcium: 9.9 mg/dL (ref 8.4–10.4)
Chloride: 101 mEq/L (ref 98–107)
Creatinine: 1 mg/dL (ref 0.6–1.1)
Glucose: 100 mg/dl — ABNORMAL HIGH (ref 70–99)
Potassium: 4.4 mEq/L (ref 3.5–5.1)
Sodium: 142 mEq/L (ref 136–145)
Total Bilirubin: 0.45 mg/dL (ref 0.20–1.20)
Total Protein: 6.5 g/dL (ref 6.4–8.3)

## 2013-01-10 LAB — LACTATE DEHYDROGENASE (CC13): LDH: 149 U/L (ref 125–245)

## 2013-01-10 LAB — MORPHOLOGY: PLT EST: DECREASED

## 2013-01-10 NOTE — Progress Notes (Signed)
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 She is presently on observation. She does not have PAC.  CLL was diagnosed fall 2011 after she presented to primary MD with fatigue and had WBC ~ 13k. She had cervical lymph node biopsy in Sept 2011 and bone marrow exam Nov 2011 with cytogenetics done at [facility]. She did not have high risk cytogenetics. She did, however, have rapid doubling time and platelets decreaed to < 100k. She and sister were not interested in consideration of transplant then. She was treated with fludara/Rituxan x 6 cycles from Feb 2012 thru July 2012, which she tolerated generally well. Despite regular review of medications, we learned after treatment completed that she had not taken prophylactic TMP/SMX as instructed. She had partial response to the fludara/rituxan by labs and repeat US to evaluate spleen size. WBC had increased since November 2012 and platelets had not improved since treatment was completed. She was seen in consultation by Velta Addison at [facility] on 12-27-2011, with recommendation for bendamustine with rituxan with findings on bone marrow 01-13-2012 including 80 - 90 % cellularity with 89% lymphocytes and decreased megakaryocytes. She began Treanda/rituxan on 4-11 and 01-28-12, with rapid marked drop in WBC, fortunately without tumor lysis complications, and improvement in platelets after first cycle. Patient  had mild rituxan reactions despite capping rate of infusion; the rituxan reactions began with nausea and did not recur after she began premedicating with ativan before coming for Rx. She was given neulasta with all of these treatments as she was not compliant previously with infection prophylaxis.   Patient has seemed more fatigued for last few  weeks, tho she may not have been getting enough sleep. She seems more short of breath with exertion, denies other respiratory symptoms. She has bruises on trunk particularly, without clear trauma. She has had no frank bleeding. She has had no fever or symptoms of infection, no pain, good appetite, no complaints of night sweats or diaphoresis otherwise. Denies environmental allergy symptoms. Remainder of 10 point Review of Systems negative.  Objective:  Vital signs in last 24 hours: Weight is up 3.5 lbs to 241.7, BP 147/83, HR 74 regular, resp 20 not labored with exertion in exam room, temp 97.1 Alert, talkative, in good spirits. Not diaphoretic. Looks comfortable  HEENT:PERRLA, sclera clear, anicteric and oropharynx clear, no lesions No alopecia Lymphatics No appreciable cervical or supraclavicular adenopathy Resp: clear to auscultation bilaterally and normal percussion bilaterally Cardio: regular rate and rhythm GI: obese, soft, nontender, normal bowel sounds, cannot appreciate HSM Extremities: extremities normal, atraumatic, no cyanosis or edema Neuro: nonfocal Skin with 3 resolving ecchymoses at waistline on left, where she wears pedometer.   Lab Results:  Results for orders placed in visit on 01/10/13  CBC WITH DIFFERENTIAL      Result Value Range   WBC 6.2  3.9 - 10.3 10e3/uL   NEUT# 3.7  1.5 - 6.5 10e3/uL   HGB 13.6  11.6 - 15.9 g/dL   HCT 03.4  74.2 - 59.5 %   Platelets 116 (*) 145 - 400 10e3/uL   MCV 84.4  79.5 - 101.0 fL   MCH 29.6  25.1 - 34.0 pg   MCHC 35.1  31.5 - 36.0 g/dL   RBC 6.38  7.56 - 4.33 10e6/uL   RDW 14.4  11.2 - 14.5 %   lymph# 1.9  0.9 - 3.3 10e3/uL   MONO# 0.5  0.1 - 0.9 10e3/uL   Eosinophils Absolute 0.1  0.0 - 0.5 10e3/uL   Basophils Absolute 0.0  0.0 - 0.1 10e3/uL   NEUT% 59.4  38.4 - 76.8 %   LYMPH% 30.4  14.0 - 49.7 %   MONO% 8.0  0.0 - 14.0 %   EOS% 1.7  0.0 - 7.0 %   BASO% 0.5  0.0 - 2.0 %   CMET available after visit normal other than CO2   32, glucose 100 and BUN 27 (creat 1.0) LDH 149  Studies/Results:  No results found.  Medications: I have reviewed the patient's current medications.  Assessment/Plan: 1.CLL: on exam and bloodwork appears stable,including mild thrombocytopenia,  slight increase in %lymphocytes,  complaints of fatigue and SOB on exertion. Will get CXR and [place] for spleen size prior to next visit in ~ 4 weeks. I have encouraged her to continue healthy lifestyle including adequate sleep, good diet, exercise. They will call prior to next scheduled appointment if needed 2.obesity with further weight gain 3.HTN  4.cognitive dysfunction: sister is HCPOA, caregiver very attentive   LIVESAY,LENNIS P, MD   01/10/2013, 10:20 AM

## 2013-01-10 NOTE — Telephone Encounter (Signed)
Gave pt appt for lab and MD for April 2014, radiology will schedule U/S

## 2013-01-11 ENCOUNTER — Encounter: Payer: Self-pay | Admitting: Oncology

## 2013-01-17 ENCOUNTER — Ambulatory Visit (HOSPITAL_COMMUNITY)
Admission: RE | Admit: 2013-01-17 | Discharge: 2013-01-17 | Disposition: A | Discharge status: Home / Self Care | Payer: Medicare Other | Source: Ambulatory Visit | Attending: Oncology | Admitting: Oncology

## 2013-01-17 ENCOUNTER — Telehealth: Payer: Self-pay

## 2013-01-17 DIAGNOSIS — C9110 Chronic lymphocytic leukemia of B-cell type not having achieved remission: Secondary | ICD-10-CM

## 2013-01-17 DIAGNOSIS — C9112 Chronic lymphocytic leukemia of B-cell type in relapse: Secondary | ICD-10-CM | POA: Insufficient documentation

## 2013-01-17 DIAGNOSIS — C9190 Lymphoid leukemia, unspecified not having achieved remission: Secondary | ICD-10-CM

## 2013-01-17 DIAGNOSIS — R0602 Shortness of breath: Secondary | ICD-10-CM | POA: Insufficient documentation

## 2013-01-17 DIAGNOSIS — I10 Essential (primary) hypertension: Secondary | ICD-10-CM | POA: Insufficient documentation

## 2013-01-17 DIAGNOSIS — R161 Splenomegaly, not elsewhere classified: Secondary | ICD-10-CM | POA: Insufficient documentation

## 2013-01-17 NOTE — Telephone Encounter (Signed)
Message copied by Lorine Bears on Wed Jan 17, 2013  4:41 PM ------      Message from: Reece Packer      Created: Wed Jan 17, 2013 12:36 PM       Labs seen and need follow up: see CXR communication also. Let her know spleen is just slightly larger on this [place]. Keep appointment with Dr Trude Mcburney as scheduled and we will discuss then ------

## 2013-01-17 NOTE — Telephone Encounter (Signed)
Spoke with Ms. Zachman and told her the results of [place] and CXR as noted below by Dr. Darrold Span.

## 2013-01-17 NOTE — Telephone Encounter (Signed)
Labs seen and need follow up: please let her know CXR does not show any problems      Attached to

## 2013-02-02 ENCOUNTER — Ambulatory Visit (HOSPITAL_BASED_OUTPATIENT_CLINIC_OR_DEPARTMENT_OTHER): Payer: Medicare Other | Admitting: Oncology

## 2013-02-02 ENCOUNTER — Encounter: Payer: Self-pay | Admitting: Oncology

## 2013-02-02 ENCOUNTER — Other Ambulatory Visit (HOSPITAL_BASED_OUTPATIENT_CLINIC_OR_DEPARTMENT_OTHER): Payer: Medicare Other | Admitting: Lab

## 2013-02-02 ENCOUNTER — Telehealth: Payer: Self-pay | Admitting: Oncology

## 2013-02-02 VITALS — BP 114/62 | HR 79 | Temp 98.3°F | Resp 20 | Ht 63.0 in | Wt 243.1 lb

## 2013-02-02 DIAGNOSIS — C9190 Lymphoid leukemia, unspecified not having achieved remission: Secondary | ICD-10-CM

## 2013-02-02 DIAGNOSIS — C9110 Chronic lymphocytic leukemia of B-cell type not having achieved remission: Secondary | ICD-10-CM

## 2013-02-02 LAB — CBC WITH DIFFERENTIAL/PLATELET
BASO%: 0.5 % (ref 0.0–2.0)
Basophils Absolute: 0 10*3/uL (ref 0.0–0.1)
EOS%: 1.3 % (ref 0.0–7.0)
Eosinophils Absolute: 0.1 10*3/uL (ref 0.0–0.5)
HCT: 39.3 % (ref 34.8–46.6)
HGB: 13.5 g/dL (ref 11.6–15.9)
LYMPH%: 31.7 % (ref 14.0–49.7)
MCH: 29.3 pg (ref 25.1–34.0)
MCHC: 34.3 g/dL (ref 31.5–36.0)
MCV: 85.4 fL (ref 79.5–101.0)
MONO#: 0.4 10*3/uL (ref 0.1–0.9)
MONO%: 5.6 % (ref 0.0–14.0)
NEUT#: 4.9 10*3/uL (ref 1.5–6.5)
NEUT%: 60.9 % (ref 38.4–76.8)
Platelets: 122 10*3/uL — ABNORMAL LOW (ref 145–400)
RBC: 4.6 10*6/uL (ref 3.70–5.45)
RDW: 13.9 % (ref 11.2–14.5)
WBC: 8.1 10*3/uL (ref 3.9–10.3)
lymph#: 2.6 10*3/uL (ref 0.9–3.3)

## 2013-02-02 LAB — COMPREHENSIVE METABOLIC PANEL (CC13)
ALT: 16 U/L (ref 0–55)
AST: 18 U/L (ref 5–34)
Albumin: 4 g/dL (ref 3.5–5.0)
Alkaline Phosphatase: 91 U/L (ref 40–150)
BUN: 27 mg/dL — ABNORMAL HIGH (ref 7.0–26.0)
CO2: 31 mEq/L — ABNORMAL HIGH (ref 22–29)
Calcium: 9.8 mg/dL (ref 8.4–10.4)
Chloride: 98 mEq/L (ref 98–107)
Creatinine: 1 mg/dL (ref 0.6–1.1)
Glucose: 99 mg/dl (ref 70–99)
Potassium: 4.1 mEq/L (ref 3.5–5.1)
Sodium: 140 mEq/L (ref 136–145)
Total Bilirubin: 0.5 mg/dL (ref 0.20–1.20)
Total Protein: 6.7 g/dL (ref 6.4–8.3)

## 2013-02-02 LAB — MORPHOLOGY: PLT EST: DECREASED

## 2013-02-02 LAB — LACTATE DEHYDROGENASE (CC13): LDH: 155 U/L (ref 125–245)

## 2013-02-02 NOTE — Progress Notes (Signed)
OFFICE PROGRESS NOTE   02/02/2013   Physicians:S.Wolters, L.Rennis Harding   INTERVAL HISTORY:   Patient is seen, together with caregiver, in continuing attention to her CLL, most recently treated with treanda/ rituxan x 6 cycles thru 06-23-12, and is now on observation. With slightly lower platelets and complaints of fatigue when I saw her in March, she had CXR and [place] for spleen size on 01-16-13, which do not have any major changes. She does not have PAC.  CLL was diagnosed fall 2011 after she presented to primary MD with fatigue and had WBC ~ 13k. She had cervical lymph node biopsy in Sept 2011 and bone marrow exam Nov 2011 with cytogenetics done at [facility]. She did not have high risk cytogenetics. She did, however, have rapid doubling time and platelets decreaed to < 100k. She and sister were not interested in consideration of transplant then. She was treated with fludara/Rituxan x 6 cycles from Feb 2012 thru July 2012, which she tolerated generally well. Despite regular review of medications, we learned after treatment completed that she had not taken prophylactic TMP/SMX as instructed. She had partial response to the fludara/rituxan by labs and repeat US to evaluate spleen size. WBC had increased since November 2012 and platelets had not improved since treatment was completed. She was seen in consultation by Velta Addison at [facility] on 12-27-2011, with recommendation for bendamustine with rituxan with findings on bone marrow 01-13-2012 including 80 - 90 % cellularity with 89% lymphocytes and decreased megakaryocytes. She began Treanda/rituxan on 4-11 and 01-28-12, with rapid marked drop in WBC, fortunately without tumor lysis complications, and improvement in platelets after first cycle. Patient had mild rituxan reactions despite capping rate of infusion; the rituxan reactions began with nausea and did not recur after she began premedicating with ativan before coming for Rx. She was given neulasta with all  of these treatments as she was not compliant previously with infection prophylaxis.  Patient has been feeling very well since she was here last, no longer complaining of fatigue or any SOB. She has been watching diet more carefully and has been getting more sleep at night. She has had no fever or symptoms of infection, no bleeding tho still bruises easily, has been back at gym several days weekly. Remainder of 10 point Review of Systems negative.  Objective:  Vital signs in last 24 hours:  BP 114/62  Pulse 79  Temp(Src) 98.3 F (36.8 C) (Oral)  Resp 20  Ht 5\' 3"  (1.6 m)  Wt 243 lb 1.6 oz (110.269 kg)  BMI 43.07 kg/m2  SpO2 98%  Weight is up 2 lbs. Easily ambulatory but needs some assistance getting off exam table. Alert, looks comfortable, very pleasant.  HEENT:PERRLA, sclera clear, anicteric and oropharynx clear, no lesions LymphaticsCervical, supraclavicular, and axillary nodes normal. Resp: clear to auscultation bilaterally and normal percussion bilaterally Cardio: regular rate and rhythm GI: obese, soft, not tender, no appreciable HSM Extremities: extremities normal, atraumatic, no cyanosis or edema Neuro:nonfocal Skin with single small resolving ecchymosis right ribs laterally Lab Results:  Results for orders placed in visit on 02/02/13  CBC WITH DIFFERENTIAL      Result Value Range   WBC 8.1  3.9 - 10.3 10e3/uL   NEUT# 4.9  1.5 - 6.5 10e3/uL   HGB 13.5  11.6 - 15.9 g/dL   HCT 16.1  09.6 - 04.5 %   Platelets 122 (*) 145 - 400 10e3/uL   MCV 85.4  79.5 - 101.0 fL   MCH 29.3  25.1 - 34.0 pg   MCHC 34.3  31.5 - 36.0 g/dL   RBC 1.61  0.96 - 0.45 10e6/uL   RDW 13.9  11.2 - 14.5 %   lymph# 2.6  0.9 - 3.3 10e3/uL   MONO# 0.4  0.1 - 0.9 10e3/uL   Eosinophils Absolute 0.1  0.0 - 0.5 10e3/uL   Basophils Absolute 0.0  0.0 - 0.1 10e3/uL   NEUT% 60.9  38.4 - 76.8 %   LYMPH% 31.7  14.0 - 49.7 %   MONO% 5.6  0.0 - 14.0 %   EOS% 1.3  0.0 - 7.0 %   BASO% 0.5  0.0 - 2.0 %   MORPHOLOGY      Result Value Range   Polychromasia Slight  Slight   Tear Drop Cells Few  Negative   Ovalocytes Few  Negative   White Cell Comments C/W auto diff     PLT EST Decreased  Adequate  LACTATE DEHYDROGENASE (CC13)      Result Value Range   LDH 155  125 - 245 U/L    CMET has not resulted at time of this transcription. Studies/Results: LIMITED ABDOMINAL ULTRASOUND 01-17-2013 Comparison: 07/14/2011  Findings: The spleen measures 13.9 cm in length. The splenic  volume is equal to 662 ml. This is compared with 601.7 ml  previously.  IMPRESSION:  Splenomegaly. This is slightly increased in size from previous  Exam.  CHEST - 2 VIEW 4-2-20114 Comparison: 12/07/2011  Findings: Heart size is normal. Vascularity is normal. Lungs are  clear without infiltrate or effusion. No adenopathy or mass is  present.  IMPRESSION:  No acute cardiopulmonary abnormality.    Medications: I have reviewed the patient's current medications.  She is to see Dr Paulino Rily again in July. I have requested CBC with that visit if possible.  Assessment/Plan:  1.CLL: vague symptoms of fatigue and SOB in March resolved, counts stable to slightly improved, CXR clear and spleen size not significantly changed. I will ask Dr Paulino Rily to check CBC in July and to send me that result, and I will see her at least in August with labs. They know to call prior if concerns. 2.obesity with further weight gain  3.HTN  4.cognitive dysfunction: sister is HCPOA, caregiver very attentive  Patient and caregiver and in agreement with plan.    LIVESAY,LENNIS P, MD   02/02/2013, 8:21 PM

## 2013-02-02 NOTE — Patient Instructions (Signed)
Keep appointment with Dr Paulino Rily in July as scheduled, then Dr Darrold Span will see you in August.  It would be fine for Dr Paulino Rily to check your blood counts at her visit, and she can let Dr Darrold Span know those results. Call before Dr Precious Reel appointment in August if any concerns

## 2013-02-02 NOTE — Telephone Encounter (Signed)
gv and printed appt sched and avs for AUG

## 2013-03-05 ENCOUNTER — Other Ambulatory Visit: Payer: Self-pay

## 2013-03-05 DIAGNOSIS — Z1231 Encounter for screening mammogram for malignant neoplasm of breast: Secondary | ICD-10-CM

## 2013-04-16 ENCOUNTER — Ambulatory Visit
Admission: RE | Admit: 2013-04-16 | Discharge: 2013-04-16 | Disposition: A | Discharge status: Home / Self Care | Payer: Medicare Other | Source: Ambulatory Visit

## 2013-04-16 DIAGNOSIS — Z1231 Encounter for screening mammogram for malignant neoplasm of breast: Secondary | ICD-10-CM

## 2013-04-17 ENCOUNTER — Telehealth: Payer: Self-pay | Admitting: *Deleted

## 2013-04-17 NOTE — Telephone Encounter (Signed)
Talked with caregiver, Erin Moore and told her Dr Precious Reel suggestion is for Erin Moore to see PCP sometime next week to get her recommendations regarding surgery. Mary verbalized understanding.

## 2013-04-17 NOTE — Telephone Encounter (Signed)
Caregiver, Renee Rival called to say that patient is having bunion foot surgery on 04/30/13. States patient had bronchitis on mid June, had allergic reaction to antibiotic and continues to be weak and has slight cough. She is wanting to know if Dr Darrold Span thinks it is OK to go forward with surgery. Has an appointment with surgeon on 04/23/13.

## 2013-04-19 ENCOUNTER — Ambulatory Visit
Admission: RE | Admit: 2013-04-19 | Discharge: 2013-04-19 | Disposition: A | Discharge status: Home / Self Care | Payer: Medicare Other | Source: Ambulatory Visit | Attending: Family Medicine | Admitting: Family Medicine

## 2013-04-19 ENCOUNTER — Other Ambulatory Visit: Payer: Self-pay | Admitting: Family Medicine

## 2013-04-19 DIAGNOSIS — S4980XA Other specified injuries of shoulder and upper arm, unspecified arm, initial encounter: Secondary | ICD-10-CM

## 2013-05-29 ENCOUNTER — Telehealth: Payer: Self-pay | Admitting: Hematology and Oncology

## 2013-05-29 NOTE — Telephone Encounter (Signed)
, °

## 2013-06-05 ENCOUNTER — Ambulatory Visit: Payer: Medicare Other

## 2013-06-05 ENCOUNTER — Other Ambulatory Visit: Payer: Medicare Other | Admitting: Lab

## 2013-06-12 ENCOUNTER — Telehealth: Payer: Self-pay | Admitting: Hematology and Oncology

## 2013-06-12 ENCOUNTER — Other Ambulatory Visit (HOSPITAL_BASED_OUTPATIENT_CLINIC_OR_DEPARTMENT_OTHER): Payer: Medicare Other | Admitting: Lab

## 2013-06-12 ENCOUNTER — Ambulatory Visit (HOSPITAL_BASED_OUTPATIENT_CLINIC_OR_DEPARTMENT_OTHER): Payer: Medicare Other | Admitting: Hematology and Oncology

## 2013-06-12 VITALS — BP 145/77 | HR 78 | Temp 98.0°F | Resp 20 | Ht 63.0 in | Wt 246.2 lb

## 2013-06-12 DIAGNOSIS — C9110 Chronic lymphocytic leukemia of B-cell type not having achieved remission: Secondary | ICD-10-CM

## 2013-06-12 DIAGNOSIS — C9190 Lymphoid leukemia, unspecified not having achieved remission: Secondary | ICD-10-CM

## 2013-06-12 DIAGNOSIS — D696 Thrombocytopenia, unspecified: Secondary | ICD-10-CM

## 2013-06-12 LAB — CBC WITH DIFFERENTIAL/PLATELET
BASO%: 0.5 % (ref 0.0–2.0)
Basophils Absolute: 0.1 10*3/uL (ref 0.0–0.1)
EOS%: 0.4 % (ref 0.0–7.0)
Eosinophils Absolute: 0.1 10*3/uL (ref 0.0–0.5)
HCT: 38.8 % (ref 34.8–46.6)
HGB: 12.7 g/dL (ref 11.6–15.9)
LYMPH%: 87.9 % — ABNORMAL HIGH (ref 14.0–49.7)
MCH: 28.5 pg (ref 25.1–34.0)
MCHC: 32.8 g/dL (ref 31.5–36.0)
MCV: 87.1 fL (ref 79.5–101.0)
MONO#: 0.8 10*3/uL (ref 0.1–0.9)
MONO%: 2.8 % (ref 0.0–14.0)
NEUT#: 2.4 10*3/uL (ref 1.5–6.5)
NEUT%: 8.4 % — ABNORMAL LOW (ref 38.4–76.8)
Platelets: 85 10*3/uL — ABNORMAL LOW (ref 145–400)
RBC: 4.46 10*6/uL (ref 3.70–5.45)
RDW: 15.9 % — ABNORMAL HIGH (ref 11.2–14.5)
WBC: 28.6 10*3/uL — ABNORMAL HIGH (ref 3.9–10.3)
lymph#: 25.2 10*3/uL — ABNORMAL HIGH (ref 0.9–3.3)

## 2013-06-12 LAB — COMPREHENSIVE METABOLIC PANEL (CC13)
ALT: 20 U/L (ref 0–55)
AST: 20 U/L (ref 5–34)
Albumin: 4.1 g/dL (ref 3.5–5.0)
Alkaline Phosphatase: 74 U/L (ref 40–150)
BUN: 24.3 mg/dL (ref 7.0–26.0)
CO2: 30 mEq/L — ABNORMAL HIGH (ref 22–29)
Calcium: 9.5 mg/dL (ref 8.4–10.4)
Chloride: 103 mEq/L (ref 98–109)
Creatinine: 1 mg/dL (ref 0.6–1.1)
Glucose: 97 mg/dl (ref 70–140)
Potassium: 4.4 mEq/L (ref 3.5–5.1)
Sodium: 142 mEq/L (ref 136–145)
Total Bilirubin: 0.48 mg/dL (ref 0.20–1.20)
Total Protein: 6.5 g/dL (ref 6.4–8.3)

## 2013-06-12 LAB — TECHNOLOGIST REVIEW

## 2013-06-12 NOTE — Telephone Encounter (Signed)
gv and pritned appt sched and avs fo rpt. °

## 2013-06-15 NOTE — Progress Notes (Signed)
ID: Erin Moore OB: [birth year]  MR#: [id number]  FAO#:[id number]  [facility]  [phone] [phone]   OFFICE PROGRESS NOTE  PCP: [name], MD  DIAGNOSIS: CLL.  HISTORY OF PRESENT ILLNESS: CLL was diagnosed fall 2011 after she presented to primary MD with fatigue and had WBC ~ 13k. She had cervical lymph node biopsy in Sept 2011 and bone marrow exam Nov 2011 with cytogenetics done at [facility]. She did not have high risk cytogenetics. She did, however, have rapid doubling time and platelets decreased to < 100k. She and sister were not interested in consideration of transplant then. She was treated with fludara/Rituxan x 6 cycles from Feb 2012 thru July 2012, which she tolerated generally well. Despite regular review of medications, we learned after treatment completed that she had not taken prophylactic TMP/SMX as instructed. She had partial response to the fludara/rituxan by labs and repeat US to evaluate spleen size. WBC had increased since November 2012 and platelets had not improved since treatment was completed. She was seen in consultation by Velta Addison at [facility] on 12-27-2011, with recommendation for bendamustine with rituxan with findings on bone marrow 01-13-2012 including 80 - 90 % cellularity with 89% lymphocytes and decreased megakaryocytes. She began Treanda/rituxan on 4-11 and 01-28-12, with rapid marked drop in WBC, fortunately without tumor lysis complications, and improvement in platelets after first cycle. Patient had mild rituxan reactions despite capping rate of infusion; the rituxan reactions began with nausea and did not recur after she began premedicating with ativan before coming for Rx. She was given neulasta with all of these treatments as she was not compliant previously with infection prophylaxis.   INTERVAL HISTORY: Patient presented today for follow up visit. She reported that she sweating a lot but does not have  significant night sweats.  Her appetite is fine and she gain some weight. She is easy bruise, feels tired and sometimes has numbness in fingers.The patient denied fever, chills, night sweats. She denied headaches, double vision, blurry vision, nasal congestion, nasal discharge, hearing problems, odynophagia or dysphagia. No chest pain, palpitations, dyspnea, cough, abdominal pain, nausea, vomiting, diarrhea, constipation, hematochezia. The patient denied dysuria, nocturia, polyuria, hematuria, myalgia, tingling, psychiatric problems.  Review of Systems  Constitutional: Positive for malaise/fatigue and diaphoresis. Negative for fever, chills and weight loss.  HENT: Negative for hearing loss, ear pain, nosebleeds, congestion, sore throat, neck pain and tinnitus.   Eyes: Negative for blurred vision, double vision, photophobia, pain and discharge.  Respiratory: Negative for cough, hemoptysis, sputum production, shortness of breath, wheezing and stridor.   Cardiovascular: Negative for chest pain, palpitations, orthopnea, claudication, leg swelling and PND.  Gastrointestinal: Negative for heartburn, nausea, vomiting, abdominal pain, diarrhea, constipation, blood in stool and melena.  Genitourinary: Negative for dysuria, urgency, frequency, hematuria and flank pain.  Musculoskeletal: Negative for myalgias and back pain.  Skin: Negative for itching and rash.  Neurological: Positive for sensory change. Negative for dizziness, tremors, speech change, focal weakness, seizures, loss of consciousness, weakness and headaches.  Endo/Heme/Allergies: Bruises/bleeds easily.  Psychiatric/Behavioral: Negative.    PAST MEDICAL HISTORY: Past Medical History  Diagnosis Date  . Hypertension   . Hyperlipemia   . Depression   . Osteoarthritis   . Mental disability   . Learning disorder   . CLL (chronic lymphocytic leukemia) FALL OF 2011    PAST SURGICAL HISTORY: Past Surgical History  Procedure Laterality Date    . Abdominal hysterectomy    . Cesarean section    .  Tubal ligation  1975    BILATERAL  . Breast reduction surgery    . Carpal tunnel release      BILATERAL  . Replacement total knee      LEFT KNEE  . Tonsillectomy      HEALTH MAINTENANCE: History  Substance Use Topics  . Smoking status: Never Smoker   . Smokeless tobacco: Never Used  . Alcohol Use: No    Allergies  Allergen Reactions  . Azithromycin Hives    Hives and fever  . Ibuprofen [Ibuprofen] Hives    Current Outpatient Prescriptions  Medication Sig Dispense Refill  . acyclovir (ZOVIRAX) 400 MG tablet Take 1 tablet (400 mg total) by mouth daily.  30 tablet  2  . allopurinol (ZYLOPRIM) 300 MG tablet TAKE one TABLET (300 MG TOTAL) BY MOUTH DAILY.  30 tablet  3  . carboxymethylcellulose (REFRESH PLUS) 0.5 % SOLN 1 drop daily.      . citalopram (CELEXA) 20 MG tablet Take 20 mg by mouth daily.      . Coenzyme Q10 (CO Q 10 PO) 50 mg. TAKE 1 TABS DAILY      . HYDROcodone-acetaminophen (NORCO) 5-325 MG per tablet TAKE 1 TABLET EVERY 4-6 HOURS AS NEEDED FOR PAIN  20 tablet  0  . LECITHIN PO Take by mouth. 4 tabs daily      . losartan-hydrochlorothiazide (HYZAAR) 100-12.5 MG per tablet       . OVER THE COUNTER MEDICATION daily. E-TEA one  DAILY      . OVER THE COUNTER MEDICATION IMMUNE STIMULATOR 2 DAILY      . OVER THE COUNTER MEDICATION PROBIOTIC 11 (2 A DAY)      . OVER THE COUNTER MEDICATION Take 2,000 Units by mouth daily. 3  tabs daily      . OVER THE COUNTER MEDICATION ANTI GAS 3 A DAY      . OVER THE COUNTER MEDICATION WHEY PROTEIN DRINKS  1 TIME A DAY      . OVER THE COUNTER MEDICATION Magnesium Complex 2 tabs a day      . OVER THE COUNTER MEDICATION Green Tea 2 tabs a day      . OVER THE COUNTER MEDICATION Liquid Chlorophyll 1 tsp. daily      . OVER THE COUNTER MEDICATION Cordycepts 1 a day      . OVER THE COUNTER MEDICATION Wheat Germ 1 tsp. daily      . UNABLE TO FIND PrtMed Name: Protease Plus  5 tablets  daily       No current facility-administered medications for this visit.   Facility-Administered Medications Ordered in Other Visits  Medication Dose Route Frequency Provider Last Rate Last Dose  . LORazepam (ATIVAN) tablet 0.5 mg  0.5 mg Oral UD Lennis P Livesay, MD   0.5 mg at 01/27/12 1011    OBJECTIVE: Filed Vitals:   06/12/13 1038  BP: 145/77  Pulse: 78  Temp: 98 F (36.7 C)  Resp: 20     Body mass index is 43.62 kg/(m^2).      PHYSICAL EXAMINATION:  HEENT: Sclerae anicteric.  Conjunctivae were pink. Pupils round and reactive bilaterally. Oral mucosa is moist without ulceration or thrush. No occipital, submandibular, cervical, supraclavicular or axillar adenopathy. Lungs: clear to auscultation without wheezes. No rales or rhonchi. Heart: regular rate and rhythm. No murmur, gallop or rubs. Abdomen: soft, non tender. No guarding or rebound tenderness. Bowel sounds are present. No palpable hepatosplenomegaly. MSK: no focal spinal tenderness. Extremities:  No clubbing or cyanosis.No calf tenderness to palpitation, no peripheral edema. The patient had grossly intact strength in upper and lower extremities. Skin exam was without ecchymosis, petechiae. Neuro: non-focal, alert and oriented to time, person and place, appropriate affect  LAB RESULTS:  CMP     Component Value Date/Time   NA 142 06/12/2013 1013   NA 141 05/19/2012 1036   K 4.4 06/12/2013 1013   K 4.3 05/19/2012 1036   CL 98 02/02/2013 1353   CL 102 05/19/2012 1036   CO2 30* 06/12/2013 1013   CO2 30 05/19/2012 1036   GLUCOSE 97 06/12/2013 1013   GLUCOSE 99 02/02/2013 1353   GLUCOSE 85 05/19/2012 1036   BUN 24.3 06/12/2013 1013   BUN 21 05/19/2012 1036   CREATININE 1.0 06/12/2013 1013   CREATININE 0.96 05/19/2012 1036   CALCIUM 9.5 06/12/2013 1013   CALCIUM 9.2 05/19/2012 1036   PROT 6.5 06/12/2013 1013   PROT 5.7* 05/19/2012 1036   ALBUMIN 4.1 06/12/2013 1013   ALBUMIN 4.2 05/19/2012 1036   AST 20 06/12/2013 1013   AST 20 05/19/2012  1036   ALT 20 06/12/2013 1013   ALT 22 05/19/2012 1036   ALKPHOS 74 06/12/2013 1013   ALKPHOS 106 05/19/2012 1036   BILITOT 0.48 06/12/2013 1013   BILITOT 0.5 05/19/2012 1036   GFRNONAA 56* 08/31/2010 1429   GFRAA  Value: >60        The eGFR has been calculated using the MDRD equation. This calculation has not been validated in all clinical situations. eGFR's persistently <60 mL/min signify possible Chronic Kidney Disease. 08/31/2010 1429    Lab Results  Component Value Date   WBC 28.6* 06/12/2013   NEUTROABS 2.4 06/12/2013   HGB 12.7 06/12/2013   HCT 38.8 06/12/2013   MCV 87.1 06/12/2013   PLT 85* 06/12/2013      Chemistry      Component Value Date/Time   NA 142 06/12/2013 1013   NA 141 05/19/2012 1036   K 4.4 06/12/2013 1013   K 4.3 05/19/2012 1036   CL 98 02/02/2013 1353   CL 102 05/19/2012 1036   CO2 30* 06/12/2013 1013   CO2 30 05/19/2012 1036   BUN 24.3 06/12/2013 1013   BUN 21 05/19/2012 1036   CREATININE 1.0 06/12/2013 1013   CREATININE 0.96 05/19/2012 1036      Component Value Date/Time   CALCIUM 9.5 06/12/2013 1013   CALCIUM 9.2 05/19/2012 1036   ALKPHOS 74 06/12/2013 1013   ALKPHOS 106 05/19/2012 1036   AST 20 06/12/2013 1013   AST 20 05/19/2012 1036   ALT 20 06/12/2013 1013   ALT 22 05/19/2012 1036   BILITOT 0.48 06/12/2013 1013   BILITOT 0.5 05/19/2012 1036      STUDIES: No results found.  ASSESSMENT AND PLAN: 1.CLL: Short doubling time and progression thrombocytopenia,fatigue.  I  recommended to repeat CBC in 2 weeks and in case of further progression of thrombocytopenia and lymphocytosis I would consider treatment after  FISH to detect: +12; del(11q); del(13q); del(17p). The patient wants to have follow up with her regular hematologist Dr. Reece Packer and I agree with this.  Myra Rude, MD   06/15/2013 12:32 AM

## 2013-06-24 ENCOUNTER — Other Ambulatory Visit: Payer: Self-pay | Admitting: Oncology

## 2013-06-24 DIAGNOSIS — C9110 Chronic lymphocytic leukemia of B-cell type not having achieved remission: Secondary | ICD-10-CM

## 2013-06-24 DIAGNOSIS — C9190 Lymphoid leukemia, unspecified not having achieved remission: Secondary | ICD-10-CM

## 2013-06-25 ENCOUNTER — Other Ambulatory Visit (HOSPITAL_COMMUNITY)
Admission: RE | Admit: 2013-06-25 | Discharge: 2013-06-25 | Disposition: A | Discharge status: Home / Self Care | Payer: Medicare Other | Source: Ambulatory Visit | Attending: Oncology | Admitting: Oncology

## 2013-06-25 ENCOUNTER — Ambulatory Visit (HOSPITAL_BASED_OUTPATIENT_CLINIC_OR_DEPARTMENT_OTHER): Payer: Medicare Other | Admitting: Oncology

## 2013-06-25 ENCOUNTER — Ambulatory Visit: Payer: Medicare Other | Admitting: Oncology

## 2013-06-25 ENCOUNTER — Telehealth: Payer: Self-pay | Admitting: *Deleted

## 2013-06-25 ENCOUNTER — Other Ambulatory Visit (HOSPITAL_BASED_OUTPATIENT_CLINIC_OR_DEPARTMENT_OTHER): Payer: Medicare Other | Admitting: Lab

## 2013-06-25 ENCOUNTER — Ambulatory Visit: Payer: Medicare Other

## 2013-06-25 VITALS — BP 146/79 | HR 87 | Temp 98.3°F | Resp 18 | Ht 63.0 in | Wt 243.1 lb

## 2013-06-25 DIAGNOSIS — C9110 Chronic lymphocytic leukemia of B-cell type not having achieved remission: Secondary | ICD-10-CM

## 2013-06-25 DIAGNOSIS — C9190 Lymphoid leukemia, unspecified not having achieved remission: Secondary | ICD-10-CM

## 2013-06-25 DIAGNOSIS — Z23 Encounter for immunization: Secondary | ICD-10-CM

## 2013-06-25 LAB — CBC WITH DIFFERENTIAL/PLATELET
BASO%: 0.3 % (ref 0.0–2.0)
Basophils Absolute: 0.1 10*3/uL (ref 0.0–0.1)
EOS%: 0.4 % (ref 0.0–7.0)
Eosinophils Absolute: 0.2 10*3/uL (ref 0.0–0.5)
HCT: 37.7 % (ref 34.8–46.6)
HGB: 12.4 g/dL (ref 11.6–15.9)
LYMPH%: 87.1 % — ABNORMAL HIGH (ref 14.0–49.7)
MCH: 29 pg (ref 25.1–34.0)
MCHC: 33 g/dL (ref 31.5–36.0)
MCV: 88 fL (ref 79.5–101.0)
MONO#: 0.4 10*3/uL (ref 0.1–0.9)
MONO%: 0.9 % (ref 0.0–14.0)
NEUT#: 4.6 10*3/uL (ref 1.5–6.5)
NEUT%: 11.3 % — ABNORMAL LOW (ref 38.4–76.8)
Platelets: 87 10*3/uL — ABNORMAL LOW (ref 145–400)
RBC: 4.28 10*6/uL (ref 3.70–5.45)
RDW: 15.6 % — ABNORMAL HIGH (ref 11.2–14.5)
WBC: 41 10*3/uL — ABNORMAL HIGH (ref 3.9–10.3)
lymph#: 35.7 10*3/uL — ABNORMAL HIGH (ref 0.9–3.3)

## 2013-06-25 LAB — MORPHOLOGY: PLT EST: DECREASED

## 2013-06-25 MED ORDER — INFLUENZA VIRUS VACC SPLIT PF IM SUSP
0.5000 mL | Freq: Once | INTRAMUSCULAR | Status: AC
  Administered 2013-06-25: 0.5 mL via INTRAMUSCULAR
  Filled 2013-06-25: qty 0.5

## 2013-06-25 NOTE — Telephone Encounter (Signed)
appts made and printed. Pt is aware that cs will call her w/ appts for CT CHEST & ABD. i printed an pof for Tiffany for DR. Willette Pa...td

## 2013-06-25 NOTE — Progress Notes (Signed)
OFFICE PROGRESS NOTE   06/25/2013   Physicians: Mila Palmer, Willette Pa  INTERVAL HISTORY:  Patient is seen, together with caregiver Corrie Dandy, in continuing attention to her CLL, as there is concern that she may have more active disease again now. She has been on observation since completing second line Treanda Rituxan in 06-2013. She was treated for bronchitis by PCP in late June, with 2 antibiotics, complicated by rash to azithromycin. She reportedly had CXR by PCP, not clear if she had CBC then. She has had more fatigue, SOB with exertion and diaphoresis since the bronchitis, tho no cough or other residual infectious symptoms. She had Xrays including CXR in early July after a fall, with lungs clear other than mild peribronchial thickening. CBC at this office on 06-12-13 had WBC 28.6 with ANC 2.4 and lymphs 87.9%, Hgb 12.7 and plt 85k (compared with CBC from 01-2013 with WBC 8.1, Hgb 13.5, plt 122k and lymphs 31%).  She does not have PAC, and has been reluctant to consider this despite difficult peripheral venous access  (due to infection problems in her mother with [facility] for metastatic cancer).   ONCOLOGIC HISTORY CLL was diagnosed fall 2011 after she presented to primary MD with fatigue and had WBC ~ 13k. She had cervical lymph node biopsy in Sept 2011 and bone marrow exam Nov 2011 with cytogenetics done at [facility]. She did not have high risk cytogenetics. She did, however, have rapid doubling time and platelets decreaed to < 100k. She and sister were not interested in consideration of transplant then. She was treated with fludara/Rituxan x 6 cycles from Feb 2012 thru July 2012, which she tolerated generally well. Despite regular review of medications, we learned after treatment completed that she had not taken prophylactic TMP/SMX as instructed. She had partial response to the fludara/rituxan by labs and repeat US to evaluate spleen size. WBC had increased since November 2012 and platelets had not  improved since treatment was completed. She was seen in consultation by Velta Addison at [facility] on 12-27-2011, with recommendation for bendamustine with rituxan with findings on bone marrow 01-13-2012 including 80 - 90 % cellularity with 89% lymphocytes and decreased megakaryocytes. She began Treanda/rituxan on 4-11 and 01-28-12, with rapid marked drop in WBC, fortunately without tumor lysis complications, and improvement in platelets after first cycle. Patient had mild rituxan reactions despite capping rate of infusion; the rituxan reactions began with nausea and did not recur after she began premedicating with ativan before coming for Rx. She was given neulasta with all of these treatments as she was not compliant previously with infection prophylaxis. She received 6 cycles of Treanda Rituxan thru 06-23-2013.    Review of systems as above, also: No fever. Still working out at gym 3 days weekly, but fatigues with activity such as walking at [employer]. Drenching sweats, not night sweats. Has gained weight over summer. No sputum, no chest pain. Bruises easily, no overt bleeding. Appetite good. Able to sleep. Bowels and bladder ok.She is again trying to lose weight, notes 3 lbs in last 2 wks. Remainder of 10 point Review of Systems negative.  Objective:  Vital signs in last 24 hours:  BP 146/79  Pulse 87  Temp(Src) 98.3 F (36.8 C) (Oral)  Resp 18  Ht 5\' 3"  (1.6 m)  Wt 243 lb 1.6 oz (110.269 kg)  BMI 43.07 kg/m2  Weight is stable from April and down 3 lbs from 2 weeks ago.  Alert, oriented and appropriate. Ambulatory without assistance. Not diaphoretic,  respirations not labored in exam room.    HEENT:PERRL, sclerae not icteric. Oral mucosa moist without lesions, posterior pharynx clear. Neck supple. Lymphatics no supraclavicular or axillary adenopathy appreciated, difficult to evaluate inguinal due to habitus. Small lower cervical nodes L>R not tender, firm or fixed. Resp: clear to  auscultation bilaterally and normal percussion bilaterally Cardio: regular rate and rhythm. No gallop. GI: obese,soft, nontender, not distended, no mass or organomegaly.  Extremities: without pitting edema, cords, tenderness Neuro:  nonfocal Skin without rash or petechiae. Resolving 3-4 cm ecchymosis right abdomen and tiny ecchymoses shins. Breasts: without dominant mass, skin or nipple findings. Axilae benign.   Lab Results:  Results for orders placed in visit on 06/25/13  CBC WITH DIFFERENTIAL      Result Value Range   WBC 41.0 (*) 3.9 - 10.3 10e3/uL   NEUT# 4.6  1.5 - 6.5 10e3/uL   HGB 12.4  11.6 - 15.9 g/dL   HCT 16.1  09.6 - 04.5 %   Platelets 87 (*) 145 - 400 10e3/uL   MCV 88.0  79.5 - 101.0 fL   MCH 29.0  25.1 - 34.0 pg   MCHC 33.0  31.5 - 36.0 g/dL   RBC 4.09  8.11 - 9.14 10e6/uL   RDW 15.6 (*) 11.2 - 14.5 %   lymph# 35.7 (*) 0.9 - 3.3 10e3/uL   MONO# 0.4  0.1 - 0.9 10e3/uL   Eosinophils Absolute 0.2  0.0 - 0.5 10e3/uL   Basophils Absolute 0.1  0.0 - 0.1 10e3/uL   NEUT% 11.3 (*) 38.4 - 76.8 %   LYMPH% 87.1 (*) 14.0 - 49.7 %   MONO% 0.9  0.0 - 14.0 %   EOS% 0.4  0.0 - 7.0 %   BASO% 0.3  0.0 - 2.0 %  MORPHOLOGY      Result Value Range   Polychromasia Slight  Slight   Tear Drop Cells Few  Negative   Ovalocytes Few  Negative   Basophilic Stippling Few  Negative   Smudge Cells Few  Negative   White Cell Comments Variant Lymphs     PLT EST Decreased  Adequate    Peripheral blood for FISH CLL sent and pending. Last CMET 06-12-13 normal with exception of CO2 30. Did not have LDH or uric acid with those chemistries.  Studies/Results:  No results found.  Medications: I have reviewed the patient's current medications. She continues to take multiple supplements Influenza vaccine (inactivated) given today  Assessment/Plan:  1.CLL: WBC increasing with lymphocytosis, platelets lower than last spring but stable in past 2 weeks without overt bleeding. They understand that  this is likely increased activity of known CLL. Will follow up FISH and repeat CTs, last scans 11-2011. No marked peripheral adenopathy on exam. She is known to Dr Willette Pa at [facility] and is interested in reevaluation by her, ? If any studies available thru [facility] or other thoughts about retreatment. I have been able to set up reconsultation by Dr Rennis Harding on 07-11-13; per that office we will fax information [phone] as CareEverywhere in EPIC not always adequate. 2.obesity, back watching diet, with apparent intentional weight loss 3.HTN  4.treatment for bronchitis in late June, complicated by rash apparently to azithromycin 5.cognitive dysfunction: sister is HCPOA, caregiver very attentive 6.flu vaccine given now       Reece Packer, MD   06/25/2013, 2:00 PM

## 2013-06-25 NOTE — Patient Instructions (Addendum)
Dr Willette Pa can see you at [facility] on Sept 24 at 9:30 AM. Phone for the [facility] hematology oncology clinic if you have questions is [phone]

## 2013-06-27 ENCOUNTER — Telehealth: Payer: Self-pay | Admitting: Oncology

## 2013-06-27 ENCOUNTER — Encounter: Payer: Self-pay | Admitting: Oncology

## 2013-06-27 ENCOUNTER — Ambulatory Visit: Payer: Medicare Other | Admitting: Oncology

## 2013-06-27 NOTE — Telephone Encounter (Signed)
FAXED PT MEDICAL RECORDS TO DR Verlon Au ELLIS AT [facility]

## 2013-06-29 ENCOUNTER — Ambulatory Visit (HOSPITAL_COMMUNITY)
Admission: RE | Admit: 2013-06-29 | Discharge: 2013-06-29 | Disposition: A | Discharge status: Home / Self Care | Payer: Medicare Other | Source: Ambulatory Visit | Attending: Oncology | Admitting: Oncology

## 2013-06-29 ENCOUNTER — Other Ambulatory Visit: Payer: Self-pay | Admitting: Oncology

## 2013-06-29 ENCOUNTER — Encounter (HOSPITAL_COMMUNITY): Payer: Self-pay

## 2013-06-29 DIAGNOSIS — C9110 Chronic lymphocytic leukemia of B-cell type not having achieved remission: Secondary | ICD-10-CM | POA: Insufficient documentation

## 2013-06-29 DIAGNOSIS — K750 Abscess of liver: Secondary | ICD-10-CM

## 2013-06-29 DIAGNOSIS — R599 Enlarged lymph nodes, unspecified: Secondary | ICD-10-CM | POA: Insufficient documentation

## 2013-06-29 DIAGNOSIS — R161 Splenomegaly, not elsewhere classified: Secondary | ICD-10-CM | POA: Insufficient documentation

## 2013-06-29 DIAGNOSIS — C9190 Lymphoid leukemia, unspecified not having achieved remission: Secondary | ICD-10-CM

## 2013-06-29 LAB — TISSUE HYBRIDIZATION TO NCBH

## 2013-06-29 MED ORDER — IOHEXOL 300 MG/ML  SOLN
100.0000 mL | Freq: Once | INTRAMUSCULAR | Status: AC | PRN
  Administered 2013-06-29: 100 mL via INTRAVENOUS

## 2013-07-05 ENCOUNTER — Telehealth: Payer: Self-pay

## 2013-07-05 NOTE — Telephone Encounter (Signed)
Message copied by Lorine Bears on Thu Jul 05, 2013 12:07 PM ------      Message from: Jama Flavors P      Created: Wed Jun 27, 2013  7:57 AM       Appointment with Dr Willette Pa in hematology oncology clinic at [facility] on 07-11-13 at 0930.        Please fax attn Dr Willette Pa:      My note 9-8, Dr Sherryll Burger note 06-15-13, my note 02-02-13, CBCs past 12 months, last CMET, [place] from April, RESULTS OF FISH when available, RESULTS of CT CAP when available.      [facility] clinic phone [phone]      [facility] fax [phone]            Please be sure patient, and caregiver or sister, aware of the [facility] appointment, as I cannot tell that AVS with this information was printed for them.            Thanks      Cc LA, TH ------

## 2013-07-05 NOTE — Telephone Encounter (Signed)
Faxed requested information to Dr. Rennis Harding as noted below by Dr. Darrold Span.

## 2013-07-09 LAB — FISH, PERIPHERAL BLOOD

## 2013-07-18 ENCOUNTER — Telehealth: Payer: Self-pay | Admitting: Hematology and Oncology

## 2013-07-18 ENCOUNTER — Other Ambulatory Visit: Payer: Medicare Other | Admitting: Lab

## 2013-07-18 ENCOUNTER — Telehealth: Payer: Self-pay | Admitting: Oncology

## 2013-07-18 ENCOUNTER — Ambulatory Visit: Payer: Medicare Other | Admitting: Oncology

## 2013-07-18 ENCOUNTER — Telehealth: Payer: Self-pay

## 2013-07-18 ENCOUNTER — Encounter: Payer: Self-pay | Admitting: Hematology and Oncology

## 2013-07-18 ENCOUNTER — Other Ambulatory Visit: Payer: Self-pay | Admitting: Oncology

## 2013-07-18 ENCOUNTER — Ambulatory Visit (HOSPITAL_BASED_OUTPATIENT_CLINIC_OR_DEPARTMENT_OTHER): Payer: Medicare Other | Admitting: Lab

## 2013-07-18 ENCOUNTER — Encounter (HOSPITAL_COMMUNITY): Payer: Self-pay | Admitting: Pharmacy Technician

## 2013-07-18 ENCOUNTER — Ambulatory Visit (HOSPITAL_BASED_OUTPATIENT_CLINIC_OR_DEPARTMENT_OTHER): Payer: Medicare Other | Admitting: Hematology and Oncology

## 2013-07-18 VITALS — BP 128/54 | HR 66 | Temp 97.5°F | Resp 18 | Ht 63.0 in | Wt 240.3 lb

## 2013-07-18 DIAGNOSIS — R5381 Other malaise: Secondary | ICD-10-CM

## 2013-07-18 DIAGNOSIS — R5383 Other fatigue: Secondary | ICD-10-CM

## 2013-07-18 DIAGNOSIS — C9190 Lymphoid leukemia, unspecified not having achieved remission: Secondary | ICD-10-CM

## 2013-07-18 DIAGNOSIS — R599 Enlarged lymph nodes, unspecified: Secondary | ICD-10-CM

## 2013-07-18 DIAGNOSIS — D696 Thrombocytopenia, unspecified: Secondary | ICD-10-CM | POA: Insufficient documentation

## 2013-07-18 DIAGNOSIS — C9110 Chronic lymphocytic leukemia of B-cell type not having achieved remission: Secondary | ICD-10-CM

## 2013-07-18 DIAGNOSIS — D72829 Elevated white blood cell count, unspecified: Secondary | ICD-10-CM

## 2013-07-18 HISTORY — DX: Other fatigue: R53.83

## 2013-07-18 LAB — LACTATE DEHYDROGENASE (CC13): LDH: 219 U/L (ref 125–245)

## 2013-07-18 LAB — COMPREHENSIVE METABOLIC PANEL (CC13)
ALT: 26 U/L (ref 0–55)
AST: 30 U/L (ref 5–34)
Albumin: 4.2 g/dL (ref 3.5–5.0)
Alkaline Phosphatase: 64 U/L (ref 40–150)
BUN: 25.9 mg/dL (ref 7.0–26.0)
CO2: 26 mEq/L (ref 22–29)
Calcium: 9.5 mg/dL (ref 8.4–10.4)
Chloride: 103 mEq/L (ref 98–109)
Creatinine: 1.1 mg/dL (ref 0.6–1.1)
Glucose: 97 mg/dl (ref 70–140)
Potassium: 4.2 mEq/L (ref 3.5–5.1)
Sodium: 139 mEq/L (ref 136–145)
Total Bilirubin: 0.6 mg/dL (ref 0.20–1.20)
Total Protein: 6.3 g/dL — ABNORMAL LOW (ref 6.4–8.3)

## 2013-07-18 LAB — CBC WITH DIFFERENTIAL/PLATELET
BASO%: 0 % (ref 0.0–2.0)
Basophils Absolute: 0 10*3/uL (ref 0.0–0.1)
EOS%: 0 % (ref 0.0–7.0)
Eosinophils Absolute: 0 10*3/uL (ref 0.0–0.5)
HCT: 36.3 % (ref 34.8–46.6)
HGB: 11.9 g/dL (ref 11.6–15.9)
LYMPH%: 94.9 % — ABNORMAL HIGH (ref 14.0–49.7)
MCH: 28.8 pg (ref 25.1–34.0)
MCHC: 32.8 g/dL (ref 31.5–36.0)
MCV: 88 fL (ref 79.5–101.0)
MONO#: 1.3 10*3/uL — ABNORMAL HIGH (ref 0.1–0.9)
MONO%: 2.1 % (ref 0.0–14.0)
NEUT#: 1.9 10*3/uL (ref 1.5–6.5)
NEUT%: 3 % — ABNORMAL LOW (ref 38.4–76.8)
Platelets: 78 10*3/uL — ABNORMAL LOW (ref 145–400)
RBC: 4.12 10*6/uL (ref 3.70–5.45)
RDW: 15.8 % — ABNORMAL HIGH (ref 11.2–14.5)
WBC: 63.4 10*3/uL (ref 3.9–10.3)
lymph#: 60.1 10*3/uL — ABNORMAL HIGH (ref 0.9–3.3)

## 2013-07-18 LAB — HOLD TUBE, BLOOD BANK

## 2013-07-18 MED ORDER — IBRUTINIB 140 MG PO CAPS: 420.0000 mg | ORAL_CAPSULE | Freq: Every day | ORAL | Status: DC

## 2013-07-18 NOTE — Telephone Encounter (Signed)
Added to NG's schedule today @ 3pm. S/w pt she is aware and will come. Per [facility] has asked that she see pt. D/t per NG.

## 2013-07-18 NOTE — Telephone Encounter (Signed)
Spoke with Ms. Erin Moore and Ms. Erin Moore is doing fine except for some fatigue.  Will cancel today's appointment as suggested by Dr. Darrold Span and obtain Bone marrrow biopsy and then reschedule MD visit to after bone marrow. Dr. Darrold Span will send pof to scheduling.

## 2013-07-18 NOTE — Telephone Encounter (Signed)
Medical Oncology  Patient's apt with this MD cancelled for today. Dr Bertis Ruddy has kindly agreed to see her now for CLL, with apt made for this afternoon with Dr Bertis Ruddy.  POF sent for bone marrow exam by IR. I spoke with patient by phone, explaining that Dr Bertis Ruddy is new full time physician at [facility] who specializes in hematologic problems. Patient is pleased with this plan and understands that the bone marrow information will help direct treatment. She has been more fatigued and will discuss this with Dr Bertis Ruddy later today.  Ila Mcgill, MD

## 2013-07-18 NOTE — Telephone Encounter (Signed)
Pt sent back to lb and given appt schedule for October.  °

## 2013-07-18 NOTE — Progress Notes (Signed)
[facility] OFFICE PROGRESS NOTE  [name], MD  DIAGNOSIS: Recurrent CLL  SUMMARY OF ONCOLOGIC HISTORY:  CLL was diagnosed fall 2011 after she presented to primary MD with fatigue and had WBC ~ 13k. She had cervical lymph node biopsy in Sept 2011 and bone marrow exam Nov 2011 with cytogenetics done at [facility]. She did not have high risk cytogenetics. She did, however, have rapid doubling time and platelets decreaed to < 100k. She and sister were not interested in consideration of transplant then. She was treated with fludara/Rituxan x 6 cycles from Feb 2012 thru July 2012, which she tolerated generally well. Despite regular review of medications, we learned after treatment completed that she had not taken prophylactic TMP/SMX as instructed. She had partial response to the fludara/rituxan by labs and repeat US to evaluate spleen size. WBC had increased since November 2012 and platelets had not improved since treatment was completed. She was seen in consultation by Velta Addison at [facility] on 12-27-2011, with recommendation for bendamustine with rituxan with findings on bone marrow 01-13-2012 including 80 - 90 % cellularity with 89% lymphocytes and decreased megakaryocytes. She began Treanda/rituxan on 4-11 and 01-28-12, with rapid marked drop in WBC, fortunately without tumor lysis complications, and improvement in platelets after first cycle. Patient had mild rituxan reactions despite capping rate of infusion; the rituxan reactions began with nausea and did not recur after she began premedicating with ativan before coming for Rx. She was given neulasta with all of these treatments as she was not compliant previously with infection prophylaxis. She received 6 cycles of Treanda Rituxan thru September 2013.  INTERVAL HISTORY: Erin Moore 67 y.o. female returns for further followup. Over the last few months, she had progressive leukocytosis, diffuse lymphadenopathy, and  thrombocytopenia. She stated that she's been bruising significantly and developed 7-10 areas of bruising every week. She denies any spontaneous bleeding such as epistaxis, hematuria, or hematochezia. Her appetite is stable she denies any recent weight loss. She has no recent infection. She complained of profound fatigue  I have reviewed the past medical history, past surgical history, social history and family history with the patient and they are unchanged from previous note.  ALLERGIES:  is allergic to azithromycin and ibuprofen.  MEDICATIONS: Current outpatient prescriptions:acyclovir (ZOVIRAX) 400 MG tablet, Take 400 mg by mouth daily., Disp: , Rfl: ;  allopurinol (ZYLOPRIM) 300 MG tablet, Take 300 mg by mouth every morning., Disp: , Rfl: ;  Carboxymethylcellul-Glycerin (REFRESH OPTIVE SENSITIVE OP), Place 1 drop into both eyes daily., Disp: , Rfl: ;  Cholecalciferol 2000 UNITS CAPS, Take 1 capsule by mouth 3 (three) times daily., Disp: , Rfl:  citalopram (CELEXA) 20 MG tablet, Take 20 mg by mouth every morning. , Disp: , Rfl: ;  Coenzyme Q10 50 MG CAPS, Take 1 capsule by mouth every evening., Disp: , Rfl: ;  LECITHIN PO, Take 4 tablets by mouth daily. , Disp: , Rfl: ;  losartan-hydrochlorothiazide (HYZAAR) 100-12.5 MG per tablet, Take 1 tablet by mouth every morning. , Disp: , Rfl: ;  magnesium oxide (MAG-OX) 400 MG tablet, Take 400 mg by mouth daily., Disp: , Rfl:  OVER THE COUNTER MEDICATION, Take 2 tablets by mouth daily. IMMUNE STIMULATOR, Disp: , Rfl: ;  OVER THE COUNTER MEDICATION, Take 2 tablets by mouth daily. PROBIOTIC 11, Disp: , Rfl: ;  OVER THE COUNTER MEDICATION, Take 1 Can by mouth every Monday, Wednesday, and Friday. WHEY PROTEIN DRINKS, Disp: , Rfl: ;  OVER THE COUNTER  MEDICATION, Take 1 tablet by mouth 2 (two) times daily. Green Tea, Disp: , Rfl:  OVER THE COUNTER MEDICATION, Take 10 mLs by mouth daily with lunch. Liquid Chlorophyll, Disp: , Rfl: ;  OVER THE COUNTER MEDICATION, Take 2  tablets by mouth 2 (two) times daily. Cordycepts, Disp: , Rfl: ;  OVER THE COUNTER MEDICATION, Take 2 tablets by mouth 2 (two) times daily. Skeletal Strength, Disp: , Rfl: ;  OVER THE COUNTER MEDICATION, Take 1 tablet by mouth 2 (two) times daily. B Right, Disp: , Rfl:  OVER THE COUNTER MEDICATION, Take 1 tablet by mouth 3 (three) times daily. Paud Arco, Disp: , Rfl: ;  OVER THE COUNTER MEDICATION, Take 1 tablet by mouth daily. Stress relief, Disp: , Rfl: ;  Potassium 99 MG TABS, Take 1 tablet by mouth 2 (two) times daily., Disp: , Rfl: ;  UNABLE TO FIND, Take 1 tablet by mouth 3 (three) times daily. Protease Plus, Disp: , Rfl:  No current facility-administered medications for this visit. Facility-Administered Medications Ordered in Other Visits: LORazepam (ATIVAN) tablet 0.5 mg, 0.5 mg, Oral, UD, Lennis P Livesay, MD, 0.5 mg at 01/27/12 1011  REVIEW OF SYSTEMS:   Constitutional: Denies fevers, chills or abnormal weight loss Eyes: Denies blurriness of vision Ears, nose, mouth, throat, and face: Denies mucositis or sore throat Respiratory: Denies cough, dyspnea or wheezes Cardiovascular: Denies palpitation, chest discomfort or lower extremity swelling Gastrointestinal:  Denies nausea, heartburn or change in bowel habits Skin: Denies abnormal skin rashes she does have significant bruising as above Neurological:Denies numbness, tingling or new weaknesses Behavioral/Psych: Mood is stable, no new changes  All other systems were reviewed with the patient and are negative.  PHYSICAL EXAMINATION: ECOG PERFORMANCE STATUS: 1 - Symptomatic but completely ambulatory  Filed Vitals:   07/18/13 1509  BP: 128/54  Pulse: 66  Temp: 97.5 F (36.4 C)  Resp: 18   Filed Weights   07/18/13 1509  Weight: 240 lb 4.8 oz (108.999 kg)    GENERAL:alert, no distress and comfortable she is morbidly obese SKIN: skin color, texture, turgor are normal, no rashes or significant lesions she does have extensive  bruising EYES: normal, Conjunctiva are pink and non-injected, sclera clear OROPHARYNX:no exudate, no erythema and lips, buccal mucosa, and tongue normal  NECK: supple, thyroid normal size, non-tender, without nodularity LYMPH: Palpable lymphadenopathy in the supraclavicular axillary region and inguinal region LUNGS: clear to auscultation and percussion with normal breathing effort HEART: regular rate & rhythm and no murmurs and no lower extremity edema ABDOMEN:abdomen soft, non-tender and normal bowel sounds cannot appreciate hepatosplenomegaly due to morbid obesity Musculoskeletal:no cyanosis of digits and no clubbing  NEURO: alert & oriented x 3 with fluent speech, no focal motor/sensory deficits  LABORATORY DATA:  I have reviewed the data as listed    Component Value Date/Time   NA 142 06/12/2013 1013   NA 141 05/19/2012 1036   K 4.4 06/12/2013 1013   K 4.3 05/19/2012 1036   CL 98 02/02/2013 1353   CL 102 05/19/2012 1036   CO2 30* 06/12/2013 1013   CO2 30 05/19/2012 1036   GLUCOSE 97 06/12/2013 1013   GLUCOSE 99 02/02/2013 1353   GLUCOSE 85 05/19/2012 1036   BUN 24.3 06/12/2013 1013   BUN 21 05/19/2012 1036   CREATININE 1.0 06/12/2013 1013   CREATININE 0.96 05/19/2012 1036   CALCIUM 9.5 06/12/2013 1013   CALCIUM 9.2 05/19/2012 1036   PROT 6.5 06/12/2013 1013   PROT 5.7* 05/19/2012 1036  ALBUMIN 4.1 06/12/2013 1013   ALBUMIN 4.2 05/19/2012 1036   AST 20 06/12/2013 1013   AST 20 05/19/2012 1036   ALT 20 06/12/2013 1013   ALT 22 05/19/2012 1036   ALKPHOS 74 06/12/2013 1013   ALKPHOS 106 05/19/2012 1036   BILITOT 0.48 06/12/2013 1013   BILITOT 0.5 05/19/2012 1036   GFRNONAA 56* 08/31/2010 1429   GFRAA  Value: >60        The eGFR has been calculated using the MDRD equation. This calculation has not been validated in all clinical situations. eGFR's persistently <60 mL/min signify possible Chronic Kidney Disease. 08/31/2010 1429    I No results found for this basename: SPEP, UPEP,  kappa and lambda light chains     Lab Results  Component Value Date   WBC 41.0* 06/25/2013   NEUTROABS 4.6 06/25/2013   HGB 12.4 06/25/2013   HCT 37.7 06/25/2013   MCV 88.0 06/25/2013   PLT 87* 06/25/2013      Chemistry      Component Value Date/Time   NA 142 06/12/2013 1013   NA 141 05/19/2012 1036   K 4.4 06/12/2013 1013   K 4.3 05/19/2012 1036   CL 98 02/02/2013 1353   CL 102 05/19/2012 1036   CO2 30* 06/12/2013 1013   CO2 30 05/19/2012 1036   BUN 24.3 06/12/2013 1013   BUN 21 05/19/2012 1036   CREATININE 1.0 06/12/2013 1013   CREATININE 0.96 05/19/2012 1036      Component Value Date/Time   CALCIUM 9.5 06/12/2013 1013   CALCIUM 9.2 05/19/2012 1036   ALKPHOS 74 06/12/2013 1013   ALKPHOS 106 05/19/2012 1036   AST 20 06/12/2013 1013   AST 20 05/19/2012 1036   ALT 20 06/12/2013 1013   ALT 22 05/19/2012 1036   BILITOT 0.48 06/12/2013 1013   BILITOT 0.5 05/19/2012 1036     RADIOGRAPHIC STUDIES: I have personally reviewed the radiological images as listed and agreed with the findings in the report. Her most recent CT scan show recurrence of bulky lymphadenopathy  ASSESSMENT: CLL, significant lymphadenopathy, progressive leukocytosis and thrombocytopenia  PLAN:  #1 CLL, recurrence of disease #2 significant lymphadenopathy #3 progressive leukocytosis #4 progressive thrombocytopenia I reviewed the recommendation from [facility]. Bone marrow aspirate and biopsy was recommended to delineate the cause of her thrombocytopenia. I suspect is due to disease recurrence. However, it was felt prudent to proceed with bone marrow aspirate and biopsy to rule out potential ITP as the patient is not anemic. Thrombocytopenia could be also due to splenomegaly. In any case, we'll proceed with a bone marrow aspirate and biopsy as scheduled. The next line of treatment would consist of ibrutinib. I will go ahead and asked for insurance preauthorization in anticipation she may need to be started on this in the near future. I will go ahead and order some bloodwork  today just in case she needs transfusion. I recommended she discontinue all the over-the-counter supplements. I will see her next week for further evaluation and review of test results.  All questions were answered. The patient knows to call the clinic with any problems, questions or concerns. We can certainly see the patient much sooner if necessary. No barriers to learning was detected. I spent 40 minutes counseling the patient face to face. The total time spent in the appointment was 60 minutes and more than 50% was on counseling and review of test results     [facility], Erin Weimann, MD 07/18/2013 3:48 PM

## 2013-07-19 ENCOUNTER — Other Ambulatory Visit: Payer: Self-pay | Admitting: Radiology

## 2013-07-19 LAB — HEPATITIS B SURFACE ANTIGEN: Hepatitis B Surface Ag: NEGATIVE

## 2013-07-19 LAB — T4, FREE: Free T4: 0.99 ng/dL (ref 0.80–1.80)

## 2013-07-19 LAB — HEPATITIS B SURFACE ANTIBODY,QUALITATIVE: Hep B S Ab: NEGATIVE

## 2013-07-19 LAB — HEPATITIS B CORE ANTIBODY, IGM: Hep B C IgM: NEGATIVE

## 2013-07-19 LAB — VITAMIN B12: Vitamin B-12: 1245 pg/mL — ABNORMAL HIGH (ref 211–911)

## 2013-07-19 LAB — TSH CHCC: TSH: 2.789 m(IU)/L (ref 0.308–3.960)

## 2013-07-23 ENCOUNTER — Ambulatory Visit (HOSPITAL_COMMUNITY)
Admission: RE | Admit: 2013-07-23 | Discharge: 2013-07-23 | Disposition: A | Discharge status: Home / Self Care | Payer: Medicare Other | Source: Ambulatory Visit | Attending: Oncology | Admitting: Oncology

## 2013-07-23 ENCOUNTER — Encounter (HOSPITAL_COMMUNITY): Payer: Self-pay

## 2013-07-23 DIAGNOSIS — C8599 Non-Hodgkin lymphoma, unspecified, extranodal and solid organ sites: Secondary | ICD-10-CM | POA: Insufficient documentation

## 2013-07-23 DIAGNOSIS — C9110 Chronic lymphocytic leukemia of B-cell type not having achieved remission: Secondary | ICD-10-CM | POA: Diagnosis not present

## 2013-07-23 DIAGNOSIS — Z96659 Presence of unspecified artificial knee joint: Secondary | ICD-10-CM | POA: Diagnosis not present

## 2013-07-23 DIAGNOSIS — E785 Hyperlipidemia, unspecified: Secondary | ICD-10-CM | POA: Insufficient documentation

## 2013-07-23 DIAGNOSIS — I10 Essential (primary) hypertension: Secondary | ICD-10-CM | POA: Insufficient documentation

## 2013-07-23 DIAGNOSIS — D696 Thrombocytopenia, unspecified: Secondary | ICD-10-CM | POA: Insufficient documentation

## 2013-07-23 DIAGNOSIS — D649 Anemia, unspecified: Secondary | ICD-10-CM | POA: Insufficient documentation

## 2013-07-23 DIAGNOSIS — C9190 Lymphoid leukemia, unspecified not having achieved remission: Secondary | ICD-10-CM

## 2013-07-23 LAB — CBC
HCT: 36.2 % (ref 36.0–46.0)
Hemoglobin: 11.9 g/dL — ABNORMAL LOW (ref 12.0–15.0)
MCH: 29.2 pg (ref 26.0–34.0)
MCHC: 32.9 g/dL (ref 30.0–36.0)
MCV: 88.7 fL (ref 78.0–100.0)
Platelets: 63 10*3/uL — ABNORMAL LOW (ref 150–400)
RBC: 4.08 MIL/uL (ref 3.87–5.11)
RDW: 14.7 % (ref 11.5–15.5)
WBC: 65.1 10*3/uL (ref 4.0–10.5)

## 2013-07-23 LAB — APTT: aPTT: 26 seconds (ref 24–37)

## 2013-07-23 LAB — BONE MARROW EXAM

## 2013-07-23 LAB — PROTIME-INR
INR: 1.08 (ref 0.00–1.49)
Prothrombin Time: 13.8 seconds (ref 11.6–15.2)

## 2013-07-23 MED ORDER — SODIUM CHLORIDE 0.9 % IV SOLN
Freq: Once | INTRAVENOUS | Status: AC
  Administered 2013-07-23: 08:00:00 via INTRAVENOUS

## 2013-07-23 MED ORDER — MIDAZOLAM HCL 2 MG/2ML IJ SOLN
INTRAMUSCULAR | Status: AC
  Filled 2013-07-23: qty 4

## 2013-07-23 MED ORDER — FENTANYL CITRATE 0.05 MG/ML IJ SOLN
INTRAMUSCULAR | Status: AC
  Filled 2013-07-23: qty 4

## 2013-07-23 MED ORDER — HYDROCODONE-ACETAMINOPHEN 5-325 MG PO TABS
1.0000 | ORAL_TABLET | ORAL | Status: DC | PRN
  Filled 2013-07-23: qty 2

## 2013-07-23 MED ORDER — MIDAZOLAM HCL 2 MG/2ML IJ SOLN
INTRAMUSCULAR | Status: AC | PRN
  Administered 2013-07-23: 2 mg via INTRAVENOUS

## 2013-07-23 MED ORDER — FENTANYL CITRATE 0.05 MG/ML IJ SOLN
INTRAMUSCULAR | Status: AC | PRN
  Administered 2013-07-23: 100 ug via INTRAVENOUS

## 2013-07-23 NOTE — H&P (Signed)
Chief Complaint: "I'm here for a bone marrow biopsy" Referring Physician:Livesay HPI: Erin Moore is an 67 y.o. female with hx of CLL. She is here for bone marrow biopsy. She has had these in the past. PMHx and meds reviewed. She otherwise feels well.  Past Medical History:  Past Medical History  Diagnosis Date  . Hypertension   . Hyperlipemia   . Depression   . Osteoarthritis   . Mental disability   . Learning disorder   . CLL (chronic lymphocytic leukemia) FALL OF 2011  . Fatigue 07/18/2013    Past Surgical History:  Past Surgical History  Procedure Laterality Date  . Abdominal hysterectomy    . Cesarean section    . Tubal ligation  1975    BILATERAL  . Breast reduction surgery    . Carpal tunnel release      BILATERAL  . Replacement total knee      LEFT KNEE  . Tonsillectomy      Family History: History reviewed. No pertinent family history.  Social History:  reports that she has never smoked. She has never used smokeless tobacco. She reports that she does not drink alcohol or use illicit drugs.  Allergies:  Allergies  Allergen Reactions  . Azithromycin Hives    Hives and fever  . Ibuprofen [Ibuprofen] Hives    Medications:   Medication List    ASK your doctor about these medications       acyclovir 400 MG tablet  Commonly known as:  ZOVIRAX  Take 400 mg by mouth daily.     allopurinol 300 MG tablet  Commonly known as:  ZYLOPRIM  Take 300 mg by mouth every morning.     Cholecalciferol 2000 UNITS Caps  Take 1 capsule by mouth 3 (three) times daily.     citalopram 20 MG tablet  Commonly known as:  CELEXA  Take 20 mg by mouth every morning.     Coenzyme Q10 50 MG Caps  Take 1 capsule by mouth every evening.     ibrutinib 140 MG capsul  Commonly known as:  IMBRUVICA  Take 3 capsules (420 mg total) by mouth daily.  Start taking on:  07/30/2013     LECITHIN PO  Take 4 tablets by mouth daily.     losartan-hydrochlorothiazide 100-12.5 MG per  tablet  Commonly known as:  HYZAAR  Take 1 tablet by mouth every morning.     magnesium oxide 400 MG tablet  Commonly known as:  MAG-OX  Take 400 mg by mouth daily.     OVER THE COUNTER MEDICATION  Take 2 tablets by mouth daily. IMMUNE STIMULATOR     OVER THE COUNTER MEDICATION  Take 2 tablets by mouth daily. PROBIOTIC 11     OVER THE COUNTER MEDICATION  Take 1 Can by mouth every Monday, Wednesday, and Friday. WHEY PROTEIN DRINKS     OVER THE COUNTER MEDICATION  Take 1 tablet by mouth 2 (two) times daily. Green Tea     OVER THE COUNTER MEDICATION  Take 10 mLs by mouth daily with lunch. Liquid Chlorophyll     OVER THE COUNTER MEDICATION  Take 2 tablets by mouth 2 (two) times daily. Cordycepts     OVER THE COUNTER MEDICATION  Take 2 tablets by mouth 2 (two) times daily. Skeletal Strength     OVER THE COUNTER MEDICATION  Take 1 tablet by mouth 2 (two) times daily. B Right     OVER THE COUNTER MEDICATION  Take  1 tablet by mouth 3 (three) times daily. Paud Arco     OVER THE COUNTER MEDICATION  Take 1 tablet by mouth daily. Stress relief     Potassium 99 MG Tabs  Take 1 tablet by mouth 2 (two) times daily.     REFRESH OPTIVE SENSITIVE OP  Place 1 drop into both eyes daily.     UNABLE TO FIND  Take 1 tablet by mouth 3 (three) times daily. Protease Plus        Please HPI for pertinent positives, otherwise complete 10 system ROS negative.  Physical Exam: BP 158/72  Pulse 71  Temp(Src) 97.7 F (36.5 C) (Oral)  Resp 20  SpO2 94% There is no weight on file to calculate BMI.   General Appearance:  Alert, cooperative, no distress, appears stated age  Head:  Normocephalic, without obvious abnormality, atraumatic  ENT: Unremarkable  Neck: Supple, symmetrical, trachea midline  Lungs:   Clear to auscultation bilaterally, no w/r/r, respirations unlabored without use of accessory muscles.  Chest Wall:  No tenderness or deformity  Heart:  Regular rate and rhythm, S1, S2  normal, no murmur, rub or gallop.  Neurologic: Normal affect, no gross deficits.   Results for orders placed during the hospital encounter of 07/23/13 (from the past 48 hour(s))  APTT     Status: None   Collection Time    07/23/13  7:35 AM      Result Value Range   aPTT 26  24 - 37 seconds  CBC     Status: Abnormal   Collection Time    07/23/13  7:35 AM      Result Value Range   WBC 65.1 (*) 4.0 - 10.5 K/uL   Comment: CRITICAL VALUE NOTED.  VALUE IS CONSISTENT WITH PREVIOUSLY REPORTED AND CALLED VALUE.   RBC 4.08  3.87 - 5.11 MIL/uL   Hemoglobin 11.9 (*) 12.0 - 15.0 g/dL   HCT 78.2  95.6 - 21.3 %   MCV 88.7  78.0 - 100.0 fL   MCH 29.2  26.0 - 34.0 pg   MCHC 32.9  30.0 - 36.0 g/dL   RDW 08.6  57.8 - 46.9 %   Platelets 63 (*) 150 - 400 K/uL   Comment: CONSISTENT WITH PREVIOUS RESULT  PROTIME-INR     Status: None   Collection Time    07/23/13  7:35 AM      Result Value Range   Prothrombin Time 13.8  11.6 - 15.2 seconds   INR 1.08  0.00 - 1.49   No results found.  Assessment/Plan CLL Discussed BM biopsy Explained procedure, risks, complications, use of sedation Labs reviewed. Consent signed in chart  Brayton El PA-C 07/23/2013, 7:59 AM

## 2013-07-23 NOTE — Procedures (Signed)
CT guided bone marrow biopsy.  No immediate complication.   

## 2013-07-27 ENCOUNTER — Ambulatory Visit (HOSPITAL_BASED_OUTPATIENT_CLINIC_OR_DEPARTMENT_OTHER): Payer: Medicare Other | Admitting: Hematology and Oncology

## 2013-07-27 ENCOUNTER — Telehealth: Payer: Self-pay | Admitting: Hematology and Oncology

## 2013-07-27 ENCOUNTER — Encounter: Payer: Self-pay | Admitting: Hematology and Oncology

## 2013-07-27 VITALS — BP 124/70 | HR 77 | Temp 98.0°F | Resp 20 | Ht 63.0 in | Wt 240.6 lb

## 2013-07-27 DIAGNOSIS — C9190 Lymphoid leukemia, unspecified not having achieved remission: Secondary | ICD-10-CM

## 2013-07-27 DIAGNOSIS — D696 Thrombocytopenia, unspecified: Secondary | ICD-10-CM

## 2013-07-27 DIAGNOSIS — C9110 Chronic lymphocytic leukemia of B-cell type not having achieved remission: Secondary | ICD-10-CM

## 2013-07-27 DIAGNOSIS — D649 Anemia, unspecified: Secondary | ICD-10-CM

## 2013-07-27 NOTE — Progress Notes (Signed)
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 She had cervical lymph node biopsy in Sept 2011 and bone marrow exam Nov 2011 with cytogenetics done at [facility]. She did not have high risk cytogenetics. She did, however, have rapid doubling time and platelets decreaed to < 100k. She and sister were not interested in consideration of transplant then. She was treated with fludara/Rituxan x 6 cycles from Feb 2012 thru July 2012, which she tolerated generally well. Despite regular review of medications, we learned after treatment completed that she had not taken prophylactic TMP/SMX as instructed. She had partial response to the fludara/rituxan by labs and repeat US to evaluate spleen size. WBC had increased since November 2012 and platelets had not improved since treatment was completed. She was seen in consultation by Velta Addison at [facility] on 12-27-2011, with recommendation for bendamustine with rituxan with findings on bone marrow 01-13-2012 including 80 - 90 % cellularity with 89% lymphocytes and decreased megakaryocytes. She began Treanda/rituxan on 4-11 and 01-28-12, with rapid marked drop in WBC, fortunately without tumor lysis complications, and improvement in platelets after first cycle. Patient had mild rituxan reactions despite capping rate of infusion; the rituxan reactions began with nausea and did not recur after she began premedicating with ativan before coming for Rx. She was given neulasta with all of these treatments as she was not compliant previously with infection prophylaxis. She received 6 cycles of Treanda Rituxan thru September 2013.  INTERVAL HISTORY: Erin Moore 67 y.o. female returns for further followup. This week, she had a bone marrow aspirate and biopsy  performed. Upon from some mild bruising and fatigue she has no new complaints. Denies any spontaneous epistaxis, hematuria, or hematochezia. No new adenopathy. No recent fevers or chills.  I have reviewed the past medical history, past surgical history, social history and family history with the patient and they are unchanged from previous note.  ALLERGIES:  is allergic to azithromycin and ibuprofen.  MEDICATIONS: Current outpatient prescriptions:acyclovir (ZOVIRAX) 400 MG tablet, Take 400 mg by mouth daily., Disp: , Rfl: ;  allopurinol (ZYLOPRIM) 300 MG tablet, Take 300 mg by mouth every morning., Disp: , Rfl: ;  Carboxymethylcellul-Glycerin (REFRESH OPTIVE SENSITIVE OP), Place 1 drop into both eyes daily., Disp: , Rfl: ;  Cholecalciferol 2000 UNITS CAPS, Take 1 capsule by mouth 3 (three) times daily., Disp: , Rfl:  citalopram (CELEXA) 20 MG tablet, Take 20 mg by mouth every morning. , Disp: , Rfl: ;  Coenzyme Q10 50 MG CAPS, Take 1 capsule by mouth every evening., Disp: , Rfl: ;  LECITHIN PO, Take 4 tablets by mouth daily. , Disp: , Rfl: ;  losartan-hydrochlorothiazide (HYZAAR) 100-12.5 MG per tablet, Take 1 tablet by mouth every morning. , Disp: , Rfl: ;  magnesium oxide (MAG-OX) 400 MG tablet, Take 400 mg by mouth daily., Disp: , Rfl:  OVER THE COUNTER MEDICATION, Take 2 tablets by mouth daily. IMMUNE STIMULATOR, Disp: , Rfl: ;  OVER THE COUNTER MEDICATION, Take 2 tablets by mouth daily. PROBIOTIC 11, Disp: , Rfl: ;  OVER THE COUNTER MEDICATION, Take 1 Can by mouth every Monday, Wednesday, and Friday. WHEY PROTEIN DRINKS, Disp: , Rfl: ;  OVER THE COUNTER MEDICATION, Take 1 tablet by mouth 2 (two) times daily. Green Tea, Disp: , Rfl:  OVER  THE COUNTER MEDICATION, Take 10 mLs by mouth daily with lunch. Liquid Chlorophyll, Disp: , Rfl: ;  OVER THE COUNTER MEDICATION, Take 2 tablets by mouth 2 (two) times daily. Cordycepts, Disp: , Rfl: ;  OVER THE COUNTER MEDICATION, Take 2 tablets by mouth 2 (two) times  daily. Skeletal Strength, Disp: , Rfl: ;  OVER THE COUNTER MEDICATION, Take 1 tablet by mouth 2 (two) times daily. B Right, Disp: , Rfl:  OVER THE COUNTER MEDICATION, Take 1 tablet by mouth 3 (three) times daily. Paud Arco, Disp: , Rfl: ;  OVER THE COUNTER MEDICATION, Take 1 tablet by mouth daily. Stress relief, Disp: , Rfl: ;  Potassium 99 MG TABS, Take 1 tablet by mouth 2 (two) times daily., Disp: , Rfl: ;  UNABLE TO FIND, Take 1 tablet by mouth 3 (three) times daily. Protease Plus, Disp: , Rfl:  [START ON 07/30/2013] ibrutinib (IMBRUVICA) 140 MG capsul, Take 3 capsules (420 mg total) by mouth daily., Disp: 90 capsule, Rfl: 0 No current facility-administered medications for this visit. Facility-Administered Medications Ordered in Other Visits: LORazepam (ATIVAN) tablet 0.5 mg, 0.5 mg, Oral, UD, Lennis P Livesay, MD, 0.5 mg at 01/27/12 1011  REVIEW OF SYSTEMS:   Constitutional: Denies fevers, chills or abnormal weight loss All other systems were reviewed with the patient and are negative.  PHYSICAL EXAMINATION: ECOG PERFORMANCE STATUS: 1 - Symptomatic but completely ambulatory  Filed Vitals:   07/27/13 1522  BP: 124/70  Pulse: 77  Temp:   Resp:    Filed Weights   07/27/13 1512  Weight: 240 lb 9.6 oz (109.135 kg)    GENERAL:alert, no distress and comfortable LYMPH: palpable lymphadenopathy in the cervical, axillary or inguinal NEURO: alert & oriented x 3 with fluent speech, no focal motor/sensory deficits  LABORATORY DATA:  I have reviewed the data as listed    Component Value Date/Time   NA 139 07/18/2013 1556   NA 141 05/19/2012 1036   K 4.2 07/18/2013 1556   K 4.3 05/19/2012 1036   CL 98 02/02/2013 1353   CL 102 05/19/2012 1036   CO2 26 07/18/2013 1556   CO2 30 05/19/2012 1036   GLUCOSE 97 07/18/2013 1556   GLUCOSE 99 02/02/2013 1353   GLUCOSE 85 05/19/2012 1036   BUN 25.9 07/18/2013 1556   BUN 21 05/19/2012 1036   CREATININE 1.1 07/18/2013 1556   CREATININE 0.96 05/19/2012 1036    CALCIUM 9.5 07/18/2013 1556   CALCIUM 9.2 05/19/2012 1036   PROT 6.3* 07/18/2013 1556   PROT 5.7* 05/19/2012 1036   ALBUMIN 4.2 07/18/2013 1556   ALBUMIN 4.2 05/19/2012 1036   AST 30 07/18/2013 1556   AST 20 05/19/2012 1036   ALT 26 07/18/2013 1556   ALT 22 05/19/2012 1036   ALKPHOS 64 07/18/2013 1556   ALKPHOS 106 05/19/2012 1036   BILITOT 0.60 07/18/2013 1556   BILITOT 0.5 05/19/2012 1036   GFRNONAA 56* 08/31/2010 1429   GFRAA  Value: >60        The eGFR has been calculated using the MDRD equation. This calculation has not been validated in all clinical situations. eGFR's persistently <60 mL/min signify possible Chronic Kidney Disease. 08/31/2010 1429    No results found for this basename: SPEP,  UPEP,   kappa and lambda light chains    Lab Results  Component Value Date   WBC 65.1* 07/23/2013   NEUTROABS 1.9 07/18/2013   HGB 11.9* 07/23/2013   HCT 36.2 07/23/2013   MCV 88.7 07/23/2013  PLT 63* 07/23/2013      Chemistry      Component Value Date/Time   NA 139 07/18/2013 1556   NA 141 05/19/2012 1036   K 4.2 07/18/2013 1556   K 4.3 05/19/2012 1036   CL 98 02/02/2013 1353   CL 102 05/19/2012 1036   CO2 26 07/18/2013 1556   CO2 30 05/19/2012 1036   BUN 25.9 07/18/2013 1556   BUN 21 05/19/2012 1036   CREATININE 1.1 07/18/2013 1556   CREATININE 0.96 05/19/2012 1036      Component Value Date/Time   CALCIUM 9.5 07/18/2013 1556   CALCIUM 9.2 05/19/2012 1036   ALKPHOS 64 07/18/2013 1556   ALKPHOS 106 05/19/2012 1036   AST 30 07/18/2013 1556   AST 20 05/19/2012 1036   ALT 26 07/18/2013 1556   ALT 22 05/19/2012 1036   BILITOT 0.60 07/18/2013 1556   BILITOT 0.5 05/19/2012 1036    ASSESSMENT: Recurrent CLL  PLAN:  #1 recurrent CLL Have a very long discussion with her sister and her caregiver. We went back-and-forth about treatment options. The major concern is whether she will be able to tolerate the treatments. She's also taking numerous supplements and the family wanted her to continue taking the supplements while on  treatment. I sense that they have a lot of anxiety related to the prospect of her getting sick with side effects of treatment. I offered to keep an eye on her on a weekly basis in my clinic with vigilant history, physical examination, and close followup on her blood work and to treat complication that might arise. We discussed some of the risks, benefits and side-effects of Ibrutinib. Some of the short term side-effects included, though not limited to, risk of fatigue, weight loss, tumor lysis syndrome, risk of allergic reactions, pancytopenia, life-threatening infections, need for transfusions of blood products, nausea, vomiting, change in bowel habits, admission to hospital for various reasons, and risks of death. The patient is aware that the response rates discussed earlier is not guaranteed.  After a long discussion, patient made an informed decision to proceed with the prescribed plan of care and went ahead to sign the consent form today.   #2 anemia and thrombocytopenia This is due to diffuse bone marrow infiltration. At present time she is relatively asymptomatic. She does not need transfusions for now. I educated the patient and family about signs and symptoms to watch out for in terms of risk of bleeding.  All questions were answered. The patient knows to call the clinic with any problems, questions or concerns. We can certainly see the patient much sooner if necessary. No barriers to learning was detected. I spent 40 minutes counseling the patient face to face. The total time spent in the appointment was 60 minutes and more than 50% was on counseling and review of test results     [facility], Michayla Mcneil, MD 07/27/2013 4:43 PM

## 2013-07-27 NOTE — Telephone Encounter (Signed)
Gave pt appt for lab and Md for [facility] 2014

## 2013-07-31 ENCOUNTER — Encounter: Payer: Self-pay | Admitting: *Deleted

## 2013-07-31 NOTE — Progress Notes (Signed)
RECEIVED A FAX FROM [employer] CONCERNING A CONFIRMATION OF PRESCRIPTION SHIPMENT FOR IMBRUVICA ON 07/30/13.

## 2013-08-06 ENCOUNTER — Telehealth: Payer: Self-pay | Admitting: *Deleted

## 2013-08-06 LAB — CHROMOSOME ANALYSIS, BONE MARROW

## 2013-08-06 NOTE — Telephone Encounter (Signed)
Call to pt regarding Ibrutinib Rx. Pt advised she received Ibrutinib on or about 10/15. Pt reports she "has a little rash on chest under the bra strap". Pt reports buying this new bra the other day. Encouraged pt to wear different bra, monitor rash if worsens to call us back. Pt verbalized understanding. No further concerns.

## 2013-08-08 ENCOUNTER — Encounter: Payer: Self-pay | Admitting: Hematology and Oncology

## 2013-08-08 ENCOUNTER — Other Ambulatory Visit (HOSPITAL_BASED_OUTPATIENT_CLINIC_OR_DEPARTMENT_OTHER): Payer: Medicare Other | Admitting: Lab

## 2013-08-08 ENCOUNTER — Ambulatory Visit (HOSPITAL_BASED_OUTPATIENT_CLINIC_OR_DEPARTMENT_OTHER): Payer: Medicare Other | Admitting: Hematology and Oncology

## 2013-08-08 ENCOUNTER — Telehealth: Payer: Self-pay | Admitting: Hematology and Oncology

## 2013-08-08 VITALS — BP 135/66 | HR 70 | Temp 97.5°F | Resp 20 | Ht 63.0 in | Wt 234.7 lb

## 2013-08-08 DIAGNOSIS — D72829 Elevated white blood cell count, unspecified: Secondary | ICD-10-CM

## 2013-08-08 DIAGNOSIS — G4700 Insomnia, unspecified: Secondary | ICD-10-CM

## 2013-08-08 DIAGNOSIS — D696 Thrombocytopenia, unspecified: Secondary | ICD-10-CM

## 2013-08-08 DIAGNOSIS — D649 Anemia, unspecified: Secondary | ICD-10-CM

## 2013-08-08 DIAGNOSIS — C9110 Chronic lymphocytic leukemia of B-cell type not having achieved remission: Secondary | ICD-10-CM

## 2013-08-08 DIAGNOSIS — C9190 Lymphoid leukemia, unspecified not having achieved remission: Secondary | ICD-10-CM

## 2013-08-08 LAB — CBC WITH DIFFERENTIAL/PLATELET
BASO%: 0.1 % (ref 0.0–2.0)
Basophils Absolute: 0.2 10*3/uL — ABNORMAL HIGH (ref 0.0–0.1)
EOS%: 0.1 % (ref 0.0–7.0)
Eosinophils Absolute: 0.2 10*3/uL (ref 0.0–0.5)
HCT: 33.6 % — ABNORMAL LOW (ref 34.8–46.6)
HGB: 11.1 g/dL — ABNORMAL LOW (ref 11.6–15.9)
LYMPH%: 94.6 % — ABNORMAL HIGH (ref 14.0–49.7)
MCH: 28.9 pg (ref 25.1–34.0)
MCHC: 33 g/dL (ref 31.5–36.0)
MCV: 87.7 fL (ref 79.5–101.0)
MONO#: 4.3 10*3/uL — ABNORMAL HIGH (ref 0.1–0.9)
MONO%: 2.6 % (ref 0.0–14.0)
NEUT#: 4.4 10*3/uL (ref 1.5–6.5)
NEUT%: 2.6 % — ABNORMAL LOW (ref 38.4–76.8)
Platelets: 62 10*3/uL — ABNORMAL LOW (ref 145–400)
RBC: 3.83 10*6/uL (ref 3.70–5.45)
RDW: 16.3 % — ABNORMAL HIGH (ref 11.2–14.5)
WBC: 167.5 10*3/uL (ref 3.9–10.3)
lymph#: 158.4 10*3/uL — ABNORMAL HIGH (ref 0.9–3.3)

## 2013-08-08 LAB — COMPREHENSIVE METABOLIC PANEL (CC13)
ALT: 13 U/L (ref 0–55)
AST: 22 U/L (ref 5–34)
Albumin: 4.1 g/dL (ref 3.5–5.0)
Alkaline Phosphatase: 65 U/L (ref 40–150)
Anion Gap: 8 mEq/L (ref 3–11)
BUN: 35.8 mg/dL — ABNORMAL HIGH (ref 7.0–26.0)
CO2: 28 mEq/L (ref 22–29)
Calcium: 9.6 mg/dL (ref 8.4–10.4)
Chloride: 101 mEq/L (ref 98–109)
Creatinine: 1.1 mg/dL (ref 0.6–1.1)
Glucose: 97 mg/dl (ref 70–140)
Potassium: 4.1 mEq/L (ref 3.5–5.1)
Sodium: 138 mEq/L (ref 136–145)
Total Bilirubin: 0.59 mg/dL (ref 0.20–1.20)
Total Protein: 6.4 g/dL (ref 6.4–8.3)

## 2013-08-08 LAB — LACTATE DEHYDROGENASE (CC13): LDH: 163 U/L (ref 125–245)

## 2013-08-08 LAB — URIC ACID (CC13): Uric Acid, Serum: 6.2 mg/dl (ref 2.6–7.4)

## 2013-08-08 LAB — TECHNOLOGIST REVIEW

## 2013-08-08 NOTE — Telephone Encounter (Signed)
Gave pt appt for lab and MD on October 2014 °

## 2013-08-08 NOTE — Progress Notes (Signed)
[facility] OFFICE PROGRESS NOTE  Patient Care Team: [name], MD as PCP - General (Family Medicine)  DIAGNOSIS: CLL with progressive anemia and thrombocytopenia  SUMMARY OF ONCOLOGIC HISTORY: Oncology History     CLL (chronic lymphoid leukemia) with failed remission   07/02/2010 Procedure LN biopsy confirmed CLL   08/18/2010 Procedure Bone marrow biopsy confirmed CLL   11/18/2010 - 04/15/2011 Chemotherapy patient received 6 cycles of FLudarabine and Rituximab   01/13/2012 Relapse/Recurrence Repeat BM biopsy confirmed relapse   01/27/2012 - 06/23/2012 Chemotherapy Bendamustine & Rituximab given, complicated by infusion reaction to Rituximab, completed 6 cycles   06/29/2013 Imaging Ct scan confirmed disease relapse with bulky lymphadenopathy   07/23/2013 Procedure Repeat BM biopsy due to thrombocytopenia and progressive luekocytosis   08/02/2013 Relapse/Recurrence Patient consented to start iburitinib as salvage therapy for relapsed CLL        CLL (chronic lymphoid leukemia) with failed remission   07/02/2010 Procedure LN biopsy confirmed CLL   08/18/2010 Procedure Bone marrow biopsy confirmed CLL   11/18/2010 - 04/15/2011 Chemotherapy patient received 6 cycles of FLudarabine and Rituximab   01/13/2012 Relapse/Recurrence Repeat BM biopsy confirmed relapse   01/27/2012 - 06/23/2012 Chemotherapy Bendamustine & Rituximab given, complicated by infusion reaction to Rituximab, completed 6 cycles   06/29/2013 Imaging Ct scan confirmed disease relapse with bulky lymphadenopathy   07/23/2013 Procedure Repeat BM biopsy due to thrombocytopenia and progressive luekocytosis   08/02/2013 Relapse/Recurrence Patient consented to start iburitinib as salvage therapy for relapsed CLL    INTERVAL HISTORY: Erin Moore 67 y.o. female returns for further followup. On 07/31/2013, she started on Ibutinib. The patient complained of new bruises all over her body. She denies any spontaneous bleeding such  as epistaxis, hematuria, or hematochezia. She felt that she is sleeping well since the start of treatment. She complained of some mild diarrhea for about 2 days after she started on chemo but it has since resolved She denies any recent fever, chills, night sweats or abnormal weight loss The patient denies any mouth sores, nausea, vomiting or change in bowel habits  I have reviewed the past medical history, past surgical history, social history and family history with the patient and they are unchanged from previous note.  ALLERGIES:  is allergic to azithromycin and ibuprofen.  MEDICATIONS:  Current Outpatient Prescriptions  Medication Sig Dispense Refill  . acyclovir (ZOVIRAX) 400 MG tablet Take 400 mg by mouth daily.      Marland Kitchen allopurinol (ZYLOPRIM) 300 MG tablet Take 300 mg by mouth every morning.      . Carboxymethylcellul-Glycerin (REFRESH OPTIVE SENSITIVE OP) Place 1 drop into both eyes daily.      . Cholecalciferol 2000 UNITS CAPS Take 1 capsule by mouth 3 (three) times daily.      . citalopram (CELEXA) 20 MG tablet Take 20 mg by mouth every morning.       . Coenzyme Q10 50 MG CAPS Take 1 capsule by mouth every evening.      . ibrutinib (IMBRUVICA) 140 MG capsul Take 3 capsules (420 mg total) by mouth daily.  90 capsule  0  . LECITHIN PO Take 4 tablets by mouth daily.       Marland Kitchen losartan-hydrochlorothiazide (HYZAAR) 100-12.5 MG per tablet Take 1 tablet by mouth every morning.       . magnesium oxide (MAG-OX) 400 MG tablet Take 400 mg by mouth daily.      Marland Kitchen OVER THE COUNTER MEDICATION Take 2 tablets by mouth daily.  IMMUNE STIMULATOR      . OVER THE COUNTER MEDICATION Take 2 tablets by mouth daily. PROBIOTIC 11      . OVER THE COUNTER MEDICATION Take 1 Can by mouth every Monday, Wednesday, and Friday. WHEY PROTEIN DRINKS      . OVER THE COUNTER MEDICATION Take 10 mLs by mouth daily with lunch. Liquid Chlorophyll      . OVER THE COUNTER MEDICATION Take 2 tablets by mouth 2 (two) times daily.  Cordycepts      . OVER THE COUNTER MEDICATION Take 2 tablets by mouth 2 (two) times daily. Skeletal Strength      . OVER THE COUNTER MEDICATION Take 1 tablet by mouth 2 (two) times daily. B Right      . OVER THE COUNTER MEDICATION Take 1 tablet by mouth 3 (three) times daily. Paud Arco      . OVER THE COUNTER MEDICATION Take 1 tablet by mouth daily. Stress relief      . Potassium 99 MG TABS Take 1 tablet by mouth 2 (two) times daily.      Marland Kitchen UNABLE TO FIND Take 1 tablet by mouth 3 (three) times daily. Protease Plus       No current facility-administered medications for this visit.   Facility-Administered Medications Ordered in Other Visits  Medication Dose Route Frequency Provider Last Rate Last Dose  . LORazepam (ATIVAN) tablet 0.5 mg  0.5 mg Oral UD Lennis Buzzy Han, MD   0.5 mg at 01/27/12 1011    REVIEW OF SYSTEMS:   Constitutional: Denies fevers, chills or abnormal weight loss Eyes: Denies blurriness of vision Ears, nose, mouth, throat, and face: Denies mucositis or sore throat Respiratory: Denies cough, dyspnea or wheezes Cardiovascular: Denies palpitation, chest discomfort or lower extremity swelling Gastrointestinal:  Denies nausea, heartburn or change in bowel habits Lymphatics: Denies new lymphadenopathy  Neurological:Denies numbness, tingling or new weaknesses Behavioral/Psych: Mood is stable, no new changes  All other systems were reviewed with the patient and are negative.  PHYSICAL EXAMINATION: ECOG PERFORMANCE STATUS: 0 - Asymptomatic  Filed Vitals:   08/08/13 1127  BP: 135/66  Pulse: 70  Temp: 97.5 F (36.4 C)  Resp: 20   Filed Weights   08/08/13 1127  Weight: 234 lb 11.2 oz (106.459 kg)    GENERAL:alert, no distress and comfortable. Patient is morbidly obese. SKIN: Extensive bruises are noted. No petechiae rash EYES: normal, Conjunctiva are pink and non-injected, sclera clear OROPHARYNX:no exudate, no erythema and lips, buccal mucosa, and tongue normal   NECK: supple, thyroid normal size, non-tender, without nodularity LYMPH:  There are palpable lymphadenopathies in the neck and axilla, unchanged compared to previous exam LUNGS: clear to auscultation and percussion with normal breathing effort HEART: regular rate & rhythm and no murmurs and no lower extremity edema ABDOMEN:abdomen soft, non-tender and normal bowel sounds Musculoskeletal:no cyanosis of digits and no clubbing  NEURO: alert & oriented x 3 with fluent speech, no focal motor/sensory deficits  LABORATORY DATA:  I have reviewed the data as listed    Component Value Date/Time   NA 138 08/08/2013 1110   NA 141 05/19/2012 1036   K 4.1 08/08/2013 1110   K 4.3 05/19/2012 1036   CL 98 02/02/2013 1353   CL 102 05/19/2012 1036   CO2 28 08/08/2013 1110   CO2 30 05/19/2012 1036   GLUCOSE 97 08/08/2013 1110   GLUCOSE 99 02/02/2013 1353   GLUCOSE 85 05/19/2012 1036   BUN 35.8* 08/08/2013 1110  BUN 21 05/19/2012 1036   CREATININE 1.1 08/08/2013 1110   CREATININE 0.96 05/19/2012 1036   CALCIUM 9.6 08/08/2013 1110   CALCIUM 9.2 05/19/2012 1036   PROT 6.4 08/08/2013 1110   PROT 5.7* 05/19/2012 1036   ALBUMIN 4.1 08/08/2013 1110   ALBUMIN 4.2 05/19/2012 1036   AST 22 08/08/2013 1110   AST 20 05/19/2012 1036   ALT 13 08/08/2013 1110   ALT 22 05/19/2012 1036   ALKPHOS 65 08/08/2013 1110   ALKPHOS 106 05/19/2012 1036   BILITOT 0.59 08/08/2013 1110   BILITOT 0.5 05/19/2012 1036   GFRNONAA 56* 08/31/2010 1429   GFRAA  Value: >60        The eGFR has been calculated using the MDRD equation. This calculation has not been validated in all clinical situations. eGFR's persistently <60 mL/min signify possible Chronic Kidney Disease. 08/31/2010 1429    No results found for this basename: SPEP, UPEP,  kappa and lambda light chains    Lab Results  Component Value Date   WBC 167.5* 08/08/2013   NEUTROABS 4.4 08/08/2013   HGB 11.1* 08/08/2013   HCT 33.6* 08/08/2013   MCV 87.7 08/08/2013   PLT 62* 08/08/2013       Chemistry      Component Value Date/Time   NA 138 08/08/2013 1110   NA 141 05/19/2012 1036   K 4.1 08/08/2013 1110   K 4.3 05/19/2012 1036   CL 98 02/02/2013 1353   CL 102 05/19/2012 1036   CO2 28 08/08/2013 1110   CO2 30 05/19/2012 1036   BUN 35.8* 08/08/2013 1110   BUN 21 05/19/2012 1036   CREATININE 1.1 08/08/2013 1110   CREATININE 0.96 05/19/2012 1036      Component Value Date/Time   CALCIUM 9.6 08/08/2013 1110   CALCIUM 9.2 05/19/2012 1036   ALKPHOS 65 08/08/2013 1110   ALKPHOS 106 05/19/2012 1036   AST 22 08/08/2013 1110   AST 20 05/19/2012 1036   ALT 13 08/08/2013 1110   ALT 22 05/19/2012 1036   BILITOT 0.59 08/08/2013 1110   BILITOT 0.5 05/19/2012 1036     ASSESSMENT:  Recurrent CLL  PLAN:  #1 recurrent CLL We'll continue chemotherapy with a dose adjustment. She very minor side effects. #2 leukocytosis This is related to his CLL. We will observe for now. There are no signs of infection. #3 anemia This is likely due to recent treatment and from her CLL. The patient denies recent history of bleeding such as epistaxis, hematuria or hematochezia. She is asymptomatic from the anemia. I will observe for now.  She does not require transfusion now. I will continue the chemotherapy at current dose without dosage adjustment.  If the anemia gets progressive worse in the future, I might have to delay her treatment or adjust the chemotherapy dose. #4 thrombocytopenia This is not worse. Thrombocytopenia is due to bone marrow infiltration. She has no new bruises but no bleeding. She does require platelet transfusion. We will just observe. #5 insomnia I recommend over-the-counter sleeping aid. If it does not work, I will start her on some medication next week. The patient has significant comorbidities. She'll continue to be seen on a weekly basis for side effects monitoring and supportive care.  Orders Placed This Encounter  Procedures  . CBC with Differential    Standing Status: Future      Number of Occurrences:      Standing Expiration Date: 04/30/2014  . Comprehensive metabolic panel    Standing Status: Future  Number of Occurrences:      Standing Expiration Date: 08/08/2014  . Lactate dehydrogenase    Standing Status: Future     Number of Occurrences:      Standing Expiration Date: 08/08/2014  . Uric Acid    Standing Status: Future     Number of Occurrences:      Standing Expiration Date: 08/08/2014   All questions were answered. The patient knows to call the clinic with any problems, questions or concerns. No barriers to learning was detected.   [facility], Shawnell Dykes, MD 08/08/2013 1:59 PM

## 2013-08-15 ENCOUNTER — Telehealth: Payer: Self-pay | Admitting: Hematology and Oncology

## 2013-08-15 ENCOUNTER — Other Ambulatory Visit: Payer: Self-pay | Admitting: *Deleted

## 2013-08-15 ENCOUNTER — Other Ambulatory Visit (HOSPITAL_BASED_OUTPATIENT_CLINIC_OR_DEPARTMENT_OTHER): Payer: Medicare Other | Admitting: Lab

## 2013-08-15 ENCOUNTER — Ambulatory Visit (HOSPITAL_BASED_OUTPATIENT_CLINIC_OR_DEPARTMENT_OTHER): Payer: Medicare Other | Admitting: Hematology and Oncology

## 2013-08-15 VITALS — BP 120/69 | HR 65 | Temp 97.1°F | Resp 20 | Ht 63.0 in | Wt 232.7 lb

## 2013-08-15 DIAGNOSIS — C9110 Chronic lymphocytic leukemia of B-cell type not having achieved remission: Secondary | ICD-10-CM

## 2013-08-15 DIAGNOSIS — C9190 Lymphoid leukemia, unspecified not having achieved remission: Secondary | ICD-10-CM

## 2013-08-15 DIAGNOSIS — D696 Thrombocytopenia, unspecified: Secondary | ICD-10-CM

## 2013-08-15 DIAGNOSIS — R5381 Other malaise: Secondary | ICD-10-CM

## 2013-08-15 DIAGNOSIS — D72829 Elevated white blood cell count, unspecified: Secondary | ICD-10-CM

## 2013-08-15 DIAGNOSIS — R5383 Other fatigue: Secondary | ICD-10-CM

## 2013-08-15 LAB — COMPREHENSIVE METABOLIC PANEL (CC13)
ALT: 9 U/L (ref 0–55)
AST: 16 U/L (ref 5–34)
Albumin: 3.9 g/dL (ref 3.5–5.0)
Alkaline Phosphatase: 60 U/L (ref 40–150)
Anion Gap: 8 mEq/L (ref 3–11)
BUN: 33.7 mg/dL — ABNORMAL HIGH (ref 7.0–26.0)
CO2: 28 mEq/L (ref 22–29)
Calcium: 9.6 mg/dL (ref 8.4–10.4)
Chloride: 103 mEq/L (ref 98–109)
Creatinine: 1 mg/dL (ref 0.6–1.1)
Glucose: 92 mg/dl (ref 70–140)
Potassium: 4.2 mEq/L (ref 3.5–5.1)
Sodium: 139 mEq/L (ref 136–145)
Total Bilirubin: 0.77 mg/dL (ref 0.20–1.20)
Total Protein: 6.3 g/dL — ABNORMAL LOW (ref 6.4–8.3)

## 2013-08-15 LAB — CBC WITH DIFFERENTIAL/PLATELET
BASO%: 0.5 % (ref 0.0–2.0)
Basophils Absolute: 0.9 10*3/uL — ABNORMAL HIGH (ref 0.0–0.1)
EOS%: 0 % (ref 0.0–7.0)
Eosinophils Absolute: 0.1 10*3/uL (ref 0.0–0.5)
HCT: 35.4 % (ref 34.8–46.6)
HGB: 10.8 g/dL — ABNORMAL LOW (ref 11.6–15.9)
LYMPH%: 95.4 % — ABNORMAL HIGH (ref 14.0–49.7)
MCH: 27.9 pg (ref 25.1–34.0)
MCHC: 30.5 g/dL — ABNORMAL LOW (ref 31.5–36.0)
MCV: 91.5 fL (ref 79.5–101.0)
MONO#: 2.7 10*3/uL — ABNORMAL HIGH (ref 0.1–0.9)
MONO%: 1.5 % (ref 0.0–14.0)
NEUT#: 4.8 10*3/uL (ref 1.5–6.5)
NEUT%: 2.6 % — ABNORMAL LOW (ref 38.4–76.8)
Platelets: 74 10*3/uL — ABNORMAL LOW (ref 145–400)
RBC: 3.87 10*6/uL (ref 3.70–5.45)
RDW: 14.7 % — ABNORMAL HIGH (ref 11.2–14.5)
WBC: 182.9 10*3/uL (ref 3.9–10.3)
lymph#: 174.4 10*3/uL — ABNORMAL HIGH (ref 0.9–3.3)
nRBC: 0 % (ref 0–0)

## 2013-08-15 LAB — URIC ACID (CC13): Uric Acid, Serum: 5.3 mg/dl (ref 2.6–7.4)

## 2013-08-15 LAB — LACTATE DEHYDROGENASE (CC13): LDH: 139 U/L (ref 125–245)

## 2013-08-15 LAB — TECHNOLOGIST REVIEW

## 2013-08-15 MED ORDER — IBRUTINIB 140 MG PO CAPS: 420.0000 mg | ORAL_CAPSULE | Freq: Every day | ORAL | Status: DC

## 2013-08-15 MED ORDER — IBRUTINIB 140 MG PO CAPS
420.0000 mg | ORAL_CAPSULE | Freq: Every day | ORAL | Status: DC
Start: 2013-08-15 — End: 2013-09-24

## 2013-08-15 NOTE — Telephone Encounter (Signed)
gv and printed appt sched and avs for pt fro NOV °

## 2013-08-15 NOTE — Telephone Encounter (Signed)
Faxed refill on on Ibrutinib to [facility] fax #[phone]

## 2013-08-15 NOTE — Progress Notes (Signed)
[facility] OFFICE PROGRESS NOTE  Patient Care Team: [name], MD as PCP - General (Family Medicine)  DIAGNOSIS: CLL, on ibrutinib  SUMMARY OF ONCOLOGIC HISTORY: Oncology History     CLL (chronic lymphoid leukemia) with failed remission   07/02/2010 Procedure LN biopsy confirmed CLL   08/18/2010 Procedure Bone marrow biopsy confirmed CLL   11/18/2010 - 04/15/2011 Chemotherapy patient received 6 cycles of FLudarabine and Rituximab   01/13/2012 Relapse/Recurrence Repeat BM biopsy confirmed relapse   01/27/2012 - 06/23/2012 Chemotherapy Bendamustine & Rituximab given, complicated by infusion reaction to Rituximab, completed 6 cycles   06/29/2013 Imaging Ct scan confirmed disease relapse with bulky lymphadenopathy   07/23/2013 Procedure Repeat BM biopsy due to thrombocytopenia and progressive luekocytosis   08/02/2013 Relapse/Recurrence Patient consented to start iburitinib as salvage therapy for relapsed CLL        CLL (chronic lymphoid leukemia) with failed remission   07/02/2010 Procedure LN biopsy confirmed CLL   08/18/2010 Procedure Bone marrow biopsy confirmed CLL   11/18/2010 - 04/15/2011 Chemotherapy patient received 6 cycles of FLudarabine and Rituximab   01/13/2012 Relapse/Recurrence Repeat BM biopsy confirmed relapse   01/27/2012 - 06/23/2012 Chemotherapy Bendamustine & Rituximab given, complicated by infusion reaction to Rituximab, completed 6 cycles   06/29/2013 Imaging Ct scan confirmed disease relapse with bulky lymphadenopathy   07/23/2013 Procedure Repeat BM biopsy due to thrombocytopenia and progressive luekocytosis   08/02/2013 Relapse/Recurrence Patient consented to start iburitinib as salvage therapy for relapsed CLL    INTERVAL HISTORY: Erin Moore 67 y.o. female returns for further followup. Over the past week, she noticed even more bruises all over her body. She denies any spontaneous bleeding such as epistaxis, hematuria, or hematochezia. She had  excellent energy level and able to exercise regularly. Denies any diarrhea. She denies any recent fever, chills, night sweats or abnormal weight loss  I have reviewed the past medical history, past surgical history, social history and family history with the patient and they are unchanged from previous note.  ALLERGIES:  is allergic to azithromycin and ibuprofen.  MEDICATIONS:  Current Outpatient Prescriptions  Medication Sig Dispense Refill  . acyclovir (ZOVIRAX) 400 MG tablet Take 400 mg by mouth daily.      Marland Kitchen allopurinol (ZYLOPRIM) 300 MG tablet Take 300 mg by mouth every morning.      . Carboxymethylcellul-Glycerin (REFRESH OPTIVE SENSITIVE OP) Place 1 drop into both eyes 2 (two) times daily.       . Cholecalciferol 2000 UNITS CAPS Take 1 capsule by mouth 3 (three) times daily.      . citalopram (CELEXA) 20 MG tablet Take 20 mg by mouth every morning.       . Coenzyme Q10 50 MG CAPS Take 1 capsule by mouth every evening.      Marland Kitchen LECITHIN PO Take 4 tablets by mouth daily.       Marland Kitchen losartan-hydrochlorothiazide (HYZAAR) 100-12.5 MG per tablet Take 1 tablet by mouth every morning.       . magnesium oxide (MAG-OX) 400 MG tablet Take 400 mg by mouth daily.      Marland Kitchen OVER THE COUNTER MEDICATION Take 2 tablets by mouth daily. IMMUNE STIMULATOR      . OVER THE COUNTER MEDICATION Take 2 tablets by mouth daily. PROBIOTIC 11      . OVER THE COUNTER MEDICATION Take 1 Can by mouth every Monday, Wednesday, and Friday. WHEY PROTEIN DRINKS      . OVER THE COUNTER MEDICATION Take 10  mLs by mouth daily with lunch. Liquid Chlorophyll      . OVER THE COUNTER MEDICATION Take 2 tablets by mouth 2 (two) times daily. Cordycepts      . OVER THE COUNTER MEDICATION Take 2 tablets by mouth 2 (two) times daily. Skeletal Strength      . OVER THE COUNTER MEDICATION Take 1 tablet by mouth 2 (two) times daily. B Right      . OVER THE COUNTER MEDICATION Take 1 tablet by mouth 3 (three) times daily. Paud Arco      . OVER THE  COUNTER MEDICATION Take 1 tablet by mouth daily. Stress relief      . Potassium 99 MG TABS Take 1 tablet by mouth 2 (two) times daily.      Marland Kitchen UNABLE TO FIND Take 1 tablet by mouth 3 (three) times daily. Protease Plus      . ibrutinib (IMBRUVICA) 140 MG capsul Take 3 capsules (420 mg total) by mouth daily.  90 capsule  0   No current facility-administered medications for this visit.   Facility-Administered Medications Ordered in Other Visits  Medication Dose Route Frequency Provider Last Rate Last Dose  . LORazepam (ATIVAN) tablet 0.5 mg  0.5 mg Oral UD Lennis Buzzy Han, MD   0.5 mg at 01/27/12 1011    REVIEW OF SYSTEMS:   Constitutional: Denies fevers, chills or abnormal weight loss Eyes: Denies blurriness of vision Ears, nose, mouth, throat, and face: Denies mucositis or sore throat Respiratory: Denies cough, dyspnea or wheezes Cardiovascular: Denies palpitation, chest discomfort or lower extremity swelling Gastrointestinal:  Denies nausea, heartburn or change in bowel habits Lymphatics: Denies new lymphadenopathy or easy bruising Neurological:Denies numbness, tingling or new weaknesses Behavioral/Psych: Mood is stable, no new changes  All other systems were reviewed with the patient and are negative.  PHYSICAL EXAMINATION: ECOG PERFORMANCE STATUS: 0 - Asymptomatic  Filed Vitals:   08/15/13 1210  BP: 120/69  Pulse: 65  Temp:   Resp:    Filed Weights   08/15/13 1202  Weight: 232 lb 11.2 oz (105.552 kg)    GENERAL:alert, no distress and comfortable. Patient is morbidly obese SKIN: Diffuse bruises throughout her body with occasional petechiae rash at the back of her neck EYES: normal, Conjunctiva are pink and non-injected, sclera clear OROPHARYNX:no exudate, no erythema and lips, buccal mucosa, and tongue normal  NECK: supple, thyroid normal size, non-tender, without nodularity LYMPH:  She had persistent lymphadenopathy in her neck and axilla, unchanged compared to previous  visit LUNGS: clear to auscultation and percussion with normal breathing effort HEART: regular rate & rhythm and no murmurs and no lower extremity edema ABDOMEN:abdomen soft, non-tender and normal bowel sounds. Unable to appreciate splenomegaly due to morbid obesity Musculoskeletal:no cyanosis of digits and no clubbing  NEURO: alert & oriented x 3 with fluent speech, no focal motor/sensory deficits  LABORATORY DATA:  I have reviewed the data as listed    Component Value Date/Time   NA 139 08/15/2013 1150   NA 141 05/19/2012 1036   K 4.2 08/15/2013 1150   K 4.3 05/19/2012 1036   CL 98 02/02/2013 1353   CL 102 05/19/2012 1036   CO2 28 08/15/2013 1150   CO2 30 05/19/2012 1036   GLUCOSE 92 08/15/2013 1150   GLUCOSE 99 02/02/2013 1353   GLUCOSE 85 05/19/2012 1036   BUN 33.7* 08/15/2013 1150   BUN 21 05/19/2012 1036   CREATININE 1.0 08/15/2013 1150   CREATININE 0.96 05/19/2012 1036   CALCIUM  9.6 08/15/2013 1150   CALCIUM 9.2 05/19/2012 1036   PROT 6.3* 08/15/2013 1150   PROT 5.7* 05/19/2012 1036   ALBUMIN 3.9 08/15/2013 1150   ALBUMIN 4.2 05/19/2012 1036   AST 16 08/15/2013 1150   AST 20 05/19/2012 1036   ALT 9 08/15/2013 1150   ALT 22 05/19/2012 1036   ALKPHOS 60 08/15/2013 1150   ALKPHOS 106 05/19/2012 1036   BILITOT 0.77 08/15/2013 1150   BILITOT 0.5 05/19/2012 1036   GFRNONAA 56* 08/31/2010 1429   GFRAA  Value: >60        The eGFR has been calculated using the MDRD equation. This calculation has not been validated in all clinical situations. eGFR's persistently <60 mL/min signify possible Chronic Kidney Disease. 08/31/2010 1429    No results found for this basename: SPEP, UPEP,  kappa and lambda light chains    Lab Results  Component Value Date   WBC 182.9* 08/15/2013   NEUTROABS 4.8 08/15/2013   HGB 10.8* 08/15/2013   HCT 35.4 08/15/2013   MCV 91.5 08/15/2013   PLT 74* 08/15/2013      Chemistry      Component Value Date/Time   NA 139 08/15/2013 1150   NA 141 05/19/2012 1036   K 4.2  08/15/2013 1150   K 4.3 05/19/2012 1036   CL 98 02/02/2013 1353   CL 102 05/19/2012 1036   CO2 28 08/15/2013 1150   CO2 30 05/19/2012 1036   BUN 33.7* 08/15/2013 1150   BUN 21 05/19/2012 1036   CREATININE 1.0 08/15/2013 1150   CREATININE 0.96 05/19/2012 1036      Component Value Date/Time   CALCIUM 9.6 08/15/2013 1150   CALCIUM 9.2 05/19/2012 1036   ALKPHOS 60 08/15/2013 1150   ALKPHOS 106 05/19/2012 1036   AST 16 08/15/2013 1150   AST 20 05/19/2012 1036   ALT 9 08/15/2013 1150   ALT 22 05/19/2012 1036   BILITOT 0.77 08/15/2013 1150   BILITOT 0.5 05/19/2012 1036     ASSESSMENT:  #1 CLL #2 progressive leukocytosis #3 anemia #4 thrombocytopenia #5 extensive bruises  PLAN:  #1 CLL #2 progressive leukocytosis #3 anemia #4 severe thrombocytopenia #5 extensive bruises Even though she has extensive bruises and progressive leukocytosis, thrombocytopenia is mildly improving. She has not experienced ill side effects from CLL. It is still too soon to tell whether she is responding to treatment. She just started on the chemotherapy less than a month ago. It is unclear to me whether the progressive leukocytosis is a rebound effect or whether this is truly due to progression of disease. I am encouraged to see that she had minimal side effects from treatment apart from extensive bruises. The bruises are related to her thrombocytopenia which in fact is improving while on treatment. I will continue to see her on a weekly basis due to her comorbidities and her significant blood count abnormalities. At present level, she does require platelet transfusion or blood transfusions for her thrombocytopenia and anemia. We will observe closely for side effects.  Orders Placed This Encounter  Procedures  . CBC with Differential    Standing Status: Future     Number of Occurrences:      Standing Expiration Date: 05/07/2014  . Lactate dehydrogenase    Standing Status: Future     Number of Occurrences:       Standing Expiration Date: 08/15/2014  . Comprehensive metabolic panel    Standing Status: Future     Number of Occurrences:  Standing Expiration Date: 08/15/2014   All questions were answered. The patient knows to call the clinic with any problems, questions or concerns. No barriers to learning was detected. I spent 25 minutes counseling the patient face to face. The total time spent in the appointment was 40 minutes and more than 50% was on counseling and review of test results     [facility], Tenzin Edelman, MD 08/15/2013 12:40 PM

## 2013-08-16 ENCOUNTER — Encounter: Payer: Self-pay | Admitting: *Deleted

## 2013-08-16 NOTE — Progress Notes (Signed)
RECEIVED A FAX FROM [facility] CONCERNING A PATIENT UPDATE: ADVERSE EVENT MONITORING. THIS FAX WAS GIVEN TO DR.GORSUCH'S NURSE, [facility].

## 2013-08-23 ENCOUNTER — Ambulatory Visit (HOSPITAL_BASED_OUTPATIENT_CLINIC_OR_DEPARTMENT_OTHER): Payer: Medicare Other | Admitting: Hematology and Oncology

## 2013-08-23 ENCOUNTER — Other Ambulatory Visit (HOSPITAL_BASED_OUTPATIENT_CLINIC_OR_DEPARTMENT_OTHER): Payer: Medicare Other | Admitting: Lab

## 2013-08-23 ENCOUNTER — Telehealth: Payer: Self-pay | Admitting: Hematology and Oncology

## 2013-08-23 VITALS — BP 138/61 | HR 66 | Temp 97.0°F | Resp 19 | Ht 63.0 in | Wt 233.4 lb

## 2013-08-23 DIAGNOSIS — R5383 Other fatigue: Secondary | ICD-10-CM

## 2013-08-23 DIAGNOSIS — C9190 Lymphoid leukemia, unspecified not having achieved remission: Secondary | ICD-10-CM

## 2013-08-23 DIAGNOSIS — C9110 Chronic lymphocytic leukemia of B-cell type not having achieved remission: Secondary | ICD-10-CM

## 2013-08-23 DIAGNOSIS — D696 Thrombocytopenia, unspecified: Secondary | ICD-10-CM

## 2013-08-23 DIAGNOSIS — D72829 Elevated white blood cell count, unspecified: Secondary | ICD-10-CM

## 2013-08-23 DIAGNOSIS — D649 Anemia, unspecified: Secondary | ICD-10-CM

## 2013-08-23 LAB — CBC WITH DIFFERENTIAL/PLATELET
BASO%: 0.1 % (ref 0.0–2.0)
Basophils Absolute: 0.3 10*3/uL — ABNORMAL HIGH (ref 0.0–0.1)
EOS%: 0.2 % (ref 0.0–7.0)
Eosinophils Absolute: 0.4 10*3/uL (ref 0.0–0.5)
HCT: 31.5 % — ABNORMAL LOW (ref 34.8–46.6)
HGB: 10.2 g/dL — ABNORMAL LOW (ref 11.6–15.9)
LYMPH%: 90.3 % — ABNORMAL HIGH (ref 14.0–49.7)
MCH: 28.7 pg (ref 25.1–34.0)
MCHC: 32.3 g/dL (ref 31.5–36.0)
MCV: 88.8 fL (ref 79.5–101.0)
MONO#: 5.1 10*3/uL — ABNORMAL HIGH (ref 0.1–0.9)
MONO%: 2.4 % (ref 0.0–14.0)
NEUT#: 15.1 10*3/uL — ABNORMAL HIGH (ref 1.5–6.5)
NEUT%: 7 % — ABNORMAL LOW (ref 38.4–76.8)
Platelets: 95 10*3/uL — ABNORMAL LOW (ref 145–400)
RBC: 3.54 10*6/uL — ABNORMAL LOW (ref 3.70–5.45)
RDW: 16.1 % — ABNORMAL HIGH (ref 11.2–14.5)
WBC: 214.2 10*3/uL (ref 3.9–10.3)
lymph#: 193.4 10*3/uL — ABNORMAL HIGH (ref 0.9–3.3)

## 2013-08-23 LAB — COMPREHENSIVE METABOLIC PANEL (CC13)
ALT: 7 U/L (ref 0–55)
AST: 14 U/L (ref 5–34)
Albumin: 3.9 g/dL (ref 3.5–5.0)
Alkaline Phosphatase: 56 U/L (ref 40–150)
Anion Gap: 8 mEq/L (ref 3–11)
BUN: 22.1 mg/dL (ref 7.0–26.0)
CO2: 30 mEq/L — ABNORMAL HIGH (ref 22–29)
Calcium: 9.7 mg/dL (ref 8.4–10.4)
Chloride: 102 mEq/L (ref 98–109)
Creatinine: 1 mg/dL (ref 0.6–1.1)
Glucose: 83 mg/dl (ref 70–140)
Potassium: 3.9 mEq/L (ref 3.5–5.1)
Sodium: 140 mEq/L (ref 136–145)
Total Bilirubin: 0.68 mg/dL (ref 0.20–1.20)
Total Protein: 6.1 g/dL — ABNORMAL LOW (ref 6.4–8.3)

## 2013-08-23 LAB — LACTATE DEHYDROGENASE (CC13): LDH: 129 U/L (ref 125–245)

## 2013-08-23 LAB — TECHNOLOGIST REVIEW

## 2013-08-23 NOTE — Telephone Encounter (Signed)
gv and printed appt sched and avs for pt for NOV °

## 2013-08-23 NOTE — Progress Notes (Signed)
[facility] OFFICE PROGRESS NOTE  Patient Care Team: [name], MD as PCP - General (Family Medicine)  DIAGNOSIS: CLL on treatment, stage IV disease  SUMMARY OF ONCOLOGIC HISTORY: Oncology History     CLL (chronic lymphoid leukemia) with failed remission   07/02/2010 Procedure LN biopsy confirmed CLL   08/18/2010 Procedure Bone marrow biopsy confirmed CLL   11/18/2010 - 04/15/2011 Chemotherapy patient received 6 cycles of FLudarabine and Rituximab   01/13/2012 Relapse/Recurrence Repeat BM biopsy confirmed relapse   01/27/2012 - 06/23/2012 Chemotherapy Bendamustine & Rituximab given, complicated by infusion reaction to Rituximab, completed 6 cycles   06/29/2013 Imaging Ct scan confirmed disease relapse with bulky lymphadenopathy   07/23/2013 Procedure Repeat BM biopsy due to thrombocytopenia and progressive luekocytosis   08/02/2013 Relapse/Recurrence Patient consented to start iburitinib as salvage therapy for relapsed CLL        CLL (chronic lymphoid leukemia) with failed remission   07/02/2010 Procedure LN biopsy confirmed CLL   08/18/2010 Procedure Bone marrow biopsy confirmed CLL   11/18/2010 - 04/15/2011 Chemotherapy patient received 6 cycles of FLudarabine and Rituximab   01/13/2012 Relapse/Recurrence Repeat BM biopsy confirmed relapse   01/27/2012 - 06/23/2012 Chemotherapy Bendamustine & Rituximab given, complicated by infusion reaction to Rituximab, completed 6 cycles   06/29/2013 Imaging Ct scan confirmed disease relapse with bulky lymphadenopathy   07/23/2013 Procedure Repeat BM biopsy due to thrombocytopenia and progressive luekocytosis   08/02/2013 Relapse/Recurrence Patient consented to start iburitinib as salvage therapy for relapsed CLL    INTERVAL HISTORY: Erin Moore 67 y.o. female returns for further followup. The patient denies any recent signs or symptoms of bleeding such as spontaneous epistaxis, hematuria or hematochezia. She does have easy bruising. She  denies any recent fever, chills, night sweats or abnormal weight loss She has very mild diarrhea once or twice a day with loose stool, resolve spontaneously. It is not associated with pain or cramps in her abdomen She remains very active with all activities of daily living  I have reviewed the past medical history, past surgical history, social history and family history with the patient and they are unchanged from previous note.  ALLERGIES:  is allergic to azithromycin and ibuprofen.  MEDICATIONS:  Current Outpatient Prescriptions  Medication Sig Dispense Refill  . acyclovir (ZOVIRAX) 400 MG tablet Take 400 mg by mouth daily.      Marland Kitchen allopurinol (ZYLOPRIM) 300 MG tablet Take 300 mg by mouth every morning.      . Carboxymethylcellul-Glycerin (REFRESH OPTIVE SENSITIVE OP) Place 1 drop into both eyes 2 (two) times daily.       . Cholecalciferol 2000 UNITS CAPS Take 1 capsule by mouth 3 (three) times daily.      . citalopram (CELEXA) 20 MG tablet Take 20 mg by mouth every morning.       . Coenzyme Q10 50 MG CAPS Take 1 capsule by mouth every evening.      . ibrutinib (IMBRUVICA) 140 MG capsul Take 3 capsules (420 mg total) by mouth daily.  90 capsule  0  . LECITHIN PO Take 4 tablets by mouth daily.       Marland Kitchen losartan-hydrochlorothiazide (HYZAAR) 100-12.5 MG per tablet Take 1 tablet by mouth every morning.       . magnesium oxide (MAG-OX) 400 MG tablet Take 400 mg by mouth daily.      Marland Kitchen OVER THE COUNTER MEDICATION Take 2 tablets by mouth daily. IMMUNE STIMULATOR      . OVER THE  COUNTER MEDICATION Take 2 tablets by mouth daily. PROBIOTIC 11      . OVER THE COUNTER MEDICATION Take 1 Can by mouth every Monday, Wednesday, and Friday. WHEY PROTEIN DRINKS      . OVER THE COUNTER MEDICATION Take 10 mLs by mouth daily with lunch. Liquid Chlorophyll      . OVER THE COUNTER MEDICATION Take 2 tablets by mouth 2 (two) times daily. Cordycepts      . OVER THE COUNTER MEDICATION Take 2 tablets by mouth 2 (two)  times daily. Skeletal Strength      . OVER THE COUNTER MEDICATION Take 1 tablet by mouth 2 (two) times daily. B Right      . OVER THE COUNTER MEDICATION Take 1 tablet by mouth 3 (three) times daily. Paud Arco      . OVER THE COUNTER MEDICATION Take 1 tablet by mouth daily. Stress relief      . Potassium 99 MG TABS Take 1 tablet by mouth 2 (two) times daily.      Marland Kitchen UNABLE TO FIND Take 1 tablet by mouth 3 (three) times daily. Protease Plus       No current facility-administered medications for this visit.   Facility-Administered Medications Ordered in Other Visits  Medication Dose Route Frequency Provider Last Rate Last Dose  . LORazepam (ATIVAN) tablet 0.5 mg  0.5 mg Oral UD Lennis Buzzy Han, MD   0.5 mg at 01/27/12 1011    REVIEW OF SYSTEMS:   Constitutional: Denies fevers, chills or abnormal weight loss Eyes: Denies blurriness of vision Ears, nose, mouth, throat, and face: Denies mucositis or sore throat Respiratory: Denies cough, dyspnea or wheezes Cardiovascular: Denies palpitation, chest discomfort or lower extremity swelling Lymphatics: Denies new lymphadenopathy Neurological:Denies numbness, tingling or new weaknesses Behavioral/Psych: Mood is stable, no new changes  All other systems were reviewed with the patient and are negative.  PHYSICAL EXAMINATION: ECOG PERFORMANCE STATUS: 1 - Symptomatic but completely ambulatory  Filed Vitals:   08/23/13 1248  BP: 138/61  Pulse: 66  Temp: 97 F (36.1 C)  Resp: 19   Filed Weights   08/23/13 1248  Weight: 233 lb 6.4 oz (105.87 kg)    GENERAL:alert, no distress and comfortable. She is morbidly obese  SKIN:  she has extensive bruising but no petechiae rash  EYES: normal, Conjunctiva are pink and non-injected, sclera clear OROPHARYNX:no exudate, no erythema and lips, buccal mucosa, and tongue normal  NECK: supple, thyroid normal size, non-tender, without nodularity LYMPH:  previously palpable lymphadenopathy in the axillary  region has reduced in size  LUNGS: clear to auscultation and percussion with normal breathing effort HEART: regular rate & rhythm and no murmurs and no lower extremity edema ABDOMEN:abdomen soft, non-tender and normal bowel sounds Musculoskeletal:no cyanosis of digits and no clubbing  NEURO: alert & oriented x 3 with fluent speech, no focal motor/sensory deficits  LABORATORY DATA:  I have reviewed the data as listed    Component Value Date/Time   NA 140 08/23/2013 1237   NA 141 05/19/2012 1036   K 3.9 08/23/2013 1237   K 4.3 05/19/2012 1036   CL 98 02/02/2013 1353   CL 102 05/19/2012 1036   CO2 30* 08/23/2013 1237   CO2 30 05/19/2012 1036   GLUCOSE 83 08/23/2013 1237   GLUCOSE 99 02/02/2013 1353   GLUCOSE 85 05/19/2012 1036   BUN 22.1 08/23/2013 1237   BUN 21 05/19/2012 1036   CREATININE 1.0 08/23/2013 1237   CREATININE 0.96 05/19/2012 1036  CALCIUM 9.7 08/23/2013 1237   CALCIUM 9.2 05/19/2012 1036   PROT 6.1* 08/23/2013 1237   PROT 5.7* 05/19/2012 1036   ALBUMIN 3.9 08/23/2013 1237   ALBUMIN 4.2 05/19/2012 1036   AST 14 08/23/2013 1237   AST 20 05/19/2012 1036   ALT 7 08/23/2013 1237   ALT 22 05/19/2012 1036   ALKPHOS 56 08/23/2013 1237   ALKPHOS 106 05/19/2012 1036   BILITOT 0.68 08/23/2013 1237   BILITOT 0.5 05/19/2012 1036   GFRNONAA 56* 08/31/2010 1429   GFRAA  Value: >60        The eGFR has been calculated using the MDRD equation. This calculation has not been validated in all clinical situations. eGFR's persistently <60 mL/min signify possible Chronic Kidney Disease. 08/31/2010 1429    No results found for this basename: SPEP,  UPEP,   kappa and lambda light chains    Lab Results  Component Value Date   WBC 214.2* 08/23/2013   NEUTROABS 15.1* 08/23/2013   HGB 10.2* 08/23/2013   HCT 31.5* 08/23/2013   MCV 88.8 08/23/2013   PLT 95* 08/23/2013      Chemistry      Component Value Date/Time   NA 140 08/23/2013 1237   NA 141 05/19/2012 1036   K 3.9 08/23/2013 1237   K 4.3 05/19/2012 1036   CL 98 02/02/2013  1353   CL 102 05/19/2012 1036   CO2 30* 08/23/2013 1237   CO2 30 05/19/2012 1036   BUN 22.1 08/23/2013 1237   BUN 21 05/19/2012 1036   CREATININE 1.0 08/23/2013 1237   CREATININE 0.96 05/19/2012 1036      Component Value Date/Time   CALCIUM 9.7 08/23/2013 1237   CALCIUM 9.2 05/19/2012 1036   ALKPHOS 56 08/23/2013 1237   ALKPHOS 106 05/19/2012 1036   AST 14 08/23/2013 1237   AST 20 05/19/2012 1036   ALT 7 08/23/2013 1237   ALT 22 05/19/2012 1036   BILITOT 0.68 08/23/2013 1237   BILITOT 0.5 05/19/2012 1036    Peripheral smear was reviewed which showed no evidence of increased blast  ASSESSMENT:  Stage IV CLL, positive response to treatment   PLAN:  #1 CLL #2 progressive leukocytosis #3 anemia #4 severe thrombocytopenia #5 extensive bruises Even though she has extensive bruises and progressive leukocytosis, thrombocytopenia is mildly improving. She has not experienced ill side effects from CLL. Lymphocytosis has been described in the publication from [place] [employer] and was observed in 69% of the participants in the initial research trial Overall, with reduction in the size of the lymphadenopathy and improvement of her platelet counts, I felt that she is responding to chemotherapy. She just started on the chemotherapy less than a month ago. I am encouraged to see that she had minimal side effects from treatment apart from extensive bruises. The bruises are related to her thrombocytopenia which in fact is improving while on treatment. I will continue to see her on a weekly basis due to her comorbidities and her significant blood count abnormalities. At present level, she does require platelet transfusion or blood transfusions for her thrombocytopenia and anemia. We will observe closely for side effects.   Orders Placed This Encounter  Procedures  . CBC with Differential    Standing Status: Future     Number of Occurrences:      Standing Expiration Date: 05/15/2014  . Comprehensive  metabolic panel    Standing Status: Future     Number of Occurrences:  Standing Expiration Date: 08/23/2014  . Lactate dehydrogenase    Standing Status: Future     Number of Occurrences:      Standing Expiration Date: 08/23/2014   All questions were answered. The patient knows to call the clinic with any problems, questions or concerns. No barriers to learning was detected. I spent 25 minutes counseling the patient face to face. The total time spent in the appointment was 40 minutes and more than 50% was on counseling and review of test results     [facility], Orlean Holtrop, MD 08/23/2013 1:21 PM

## 2013-08-28 NOTE — Telephone Encounter (Signed)
RECEIVED A FAX FROM [employer] [facility]CERNING A CONFIRMATION OF PRESCRIPTION SHIPMENT FOR IMBRUVICA ON 08/27/13.

## 2013-08-30 ENCOUNTER — Ambulatory Visit (HOSPITAL_BASED_OUTPATIENT_CLINIC_OR_DEPARTMENT_OTHER): Payer: Medicare Other | Admitting: Hematology and Oncology

## 2013-08-30 ENCOUNTER — Other Ambulatory Visit (HOSPITAL_BASED_OUTPATIENT_CLINIC_OR_DEPARTMENT_OTHER): Payer: Medicare Other

## 2013-08-30 ENCOUNTER — Telehealth: Payer: Self-pay | Admitting: Hematology and Oncology

## 2013-08-30 VITALS — BP 123/63 | HR 59 | Temp 97.1°F | Resp 19 | Ht 63.0 in | Wt 231.8 lb

## 2013-08-30 DIAGNOSIS — D649 Anemia, unspecified: Secondary | ICD-10-CM

## 2013-08-30 DIAGNOSIS — D696 Thrombocytopenia, unspecified: Secondary | ICD-10-CM

## 2013-08-30 DIAGNOSIS — C9110 Chronic lymphocytic leukemia of B-cell type not having achieved remission: Secondary | ICD-10-CM

## 2013-08-30 DIAGNOSIS — C9190 Lymphoid leukemia, unspecified not having achieved remission: Secondary | ICD-10-CM

## 2013-08-30 DIAGNOSIS — D72829 Elevated white blood cell count, unspecified: Secondary | ICD-10-CM

## 2013-08-30 LAB — CBC WITH DIFFERENTIAL/PLATELET
BASO%: 0.1 % (ref 0.0–2.0)
Basophils Absolute: 0.2 10*3/uL — ABNORMAL HIGH (ref 0.0–0.1)
EOS%: 0.3 % (ref 0.0–7.0)
Eosinophils Absolute: 0.7 10*3/uL — ABNORMAL HIGH (ref 0.0–0.5)
HCT: 32.7 % — ABNORMAL LOW (ref 34.8–46.6)
HGB: 10.5 g/dL — ABNORMAL LOW (ref 11.6–15.9)
LYMPH%: 90.3 % — ABNORMAL HIGH (ref 14.0–49.7)
MCH: 28.5 pg (ref 25.1–34.0)
MCHC: 32.1 g/dL (ref 31.5–36.0)
MCV: 88.8 fL (ref 79.5–101.0)
MONO#: 6.7 10*3/uL — ABNORMAL HIGH (ref 0.1–0.9)
MONO%: 2.6 % (ref 0.0–14.0)
NEUT#: 17 10*3/uL — ABNORMAL HIGH (ref 1.5–6.5)
NEUT%: 6.7 % — ABNORMAL LOW (ref 38.4–76.8)
Platelets: 105 10*3/uL — ABNORMAL LOW (ref 145–400)
RBC: 3.68 10*6/uL — ABNORMAL LOW (ref 3.70–5.45)
RDW: 16.5 % — ABNORMAL HIGH (ref 11.2–14.5)
WBC: 253 10*3/uL (ref 3.9–10.3)
lymph#: 228.4 10*3/uL — ABNORMAL HIGH (ref 0.9–3.3)

## 2013-08-30 LAB — COMPREHENSIVE METABOLIC PANEL (CC13)
ALT: 8 U/L (ref 0–55)
AST: 13 U/L (ref 5–34)
Albumin: 4.2 g/dL (ref 3.5–5.0)
Alkaline Phosphatase: 61 U/L (ref 40–150)
Anion Gap: 8 mEq/L (ref 3–11)
BUN: 22.3 mg/dL (ref 7.0–26.0)
CO2: 29 mEq/L (ref 22–29)
Calcium: 9.7 mg/dL (ref 8.4–10.4)
Chloride: 105 mEq/L (ref 98–109)
Creatinine: 1 mg/dL (ref 0.6–1.1)
Glucose: 96 mg/dl (ref 70–140)
Potassium: 4.2 mEq/L (ref 3.5–5.1)
Sodium: 142 mEq/L (ref 136–145)
Total Bilirubin: 0.66 mg/dL (ref 0.20–1.20)
Total Protein: 6.3 g/dL — ABNORMAL LOW (ref 6.4–8.3)

## 2013-08-30 LAB — LACTATE DEHYDROGENASE (CC13): LDH: 123 U/L — ABNORMAL LOW (ref 125–245)

## 2013-08-30 NOTE — Telephone Encounter (Signed)
gv and prited appt sched and avs for pt for NOV

## 2013-08-30 NOTE — Progress Notes (Signed)
[facility] OFFICE PROGRESS NOTE  Patient Care Team: [name], MD as PCP - General (Family Medicine)  DIAGNOSIS: CLL, stage IV disease  SUMMARY OF ONCOLOGIC HISTORY:   CLL (chronic lymphoid leukemia) with failed remission   07/02/2010 Procedure LN biopsy confirmed CLL   08/18/2010 Procedure Bone marrow biopsy confirmed CLL   11/18/2010 - 04/15/2011 Chemotherapy patient received 6 cycles of FLudarabine and Rituximab   01/13/2012 Relapse/Recurrence Repeat BM biopsy confirmed relapse   01/27/2012 - 06/23/2012 Chemotherapy Bendamustine & Rituximab given, complicated by infusion reaction to Rituximab, completed 6 cycles   06/29/2013 Imaging Ct scan confirmed disease relapse with bulky lymphadenopathy   07/23/2013 Procedure Repeat BM biopsy due to thrombocytopenia and progressive luekocytosis   08/02/2013 Relapse/Recurrence Patient consented to start iburitinib as salvage therapy for relapsed CLL    INTERVAL HISTORY: Erin Moore 67 y.o. female returns for further followup. She had excellent energy level. She is to have easy bruising but they are improving. She denies any recent fever, chills, night sweats or abnormal weight loss No recent change in bowel habits. Denies any new lymphadenopathy.  I have reviewed the past medical history, past surgical history, social history and family history with the patient and they are unchanged from previous note.  ALLERGIES:  is allergic to azithromycin and ibuprofen.  MEDICATIONS:  Current Outpatient Prescriptions  Medication Sig Dispense Refill  . acyclovir (ZOVIRAX) 400 MG tablet Take 400 mg by mouth daily.      Marland Kitchen allopurinol (ZYLOPRIM) 300 MG tablet Take 300 mg by mouth every morning.      . Carboxymethylcellul-Glycerin (REFRESH OPTIVE SENSITIVE OP) Place 1 drop into both eyes 2 (two) times daily.       . Cholecalciferol 2000 UNITS CAPS Take 1 capsule by mouth 3 (three) times daily.      . citalopram (CELEXA) 20 MG tablet Take  20 mg by mouth every morning.       . Coenzyme Q10 50 MG CAPS Take 1 capsule by mouth every evening.      . ibrutinib (IMBRUVICA) 140 MG capsul Take 3 capsules (420 mg total) by mouth daily.  90 capsule  0  . LECITHIN PO Take 4 tablets by mouth daily.       Marland Kitchen losartan-hydrochlorothiazide (HYZAAR) 100-12.5 MG per tablet Take 1 tablet by mouth every morning.       . magnesium oxide (MAG-OX) 400 MG tablet Take 400 mg by mouth daily.      Marland Kitchen OVER THE COUNTER MEDICATION Take 2 tablets by mouth daily. IMMUNE STIMULATOR      . OVER THE COUNTER MEDICATION Take 2 tablets by mouth daily. PROBIOTIC 11      . OVER THE COUNTER MEDICATION Take 1 Can by mouth every Monday, Wednesday, and Friday. WHEY PROTEIN DRINKS      . OVER THE COUNTER MEDICATION Take 10 mLs by mouth daily with lunch. Liquid Chlorophyll      . OVER THE COUNTER MEDICATION Take 2 tablets by mouth 2 (two) times daily. Cordycepts      . OVER THE COUNTER MEDICATION Take 2 tablets by mouth 2 (two) times daily. Skeletal Strength      . OVER THE COUNTER MEDICATION Take 1 tablet by mouth 2 (two) times daily. B Right      . OVER THE COUNTER MEDICATION Take 1 tablet by mouth 3 (three) times daily. Paud Arco      . OVER THE COUNTER MEDICATION Take 1 tablet by mouth daily.  Stress relief      . Potassium 99 MG TABS Take 1 tablet by mouth 2 (two) times daily.      Marland Kitchen UNABLE TO FIND Take 1 tablet by mouth 3 (three) times daily. Protease Plus       No current facility-administered medications for this visit.   Facility-Administered Medications Ordered in Other Visits  Medication Dose Route Frequency Provider Last Rate Last Dose  . LORazepam (ATIVAN) tablet 0.5 mg  0.5 mg Oral UD Lennis Buzzy Han, MD   0.5 mg at 01/27/12 1011    REVIEW OF SYSTEMS:   Constitutional: Denies fevers, chills or abnormal weight loss Eyes: Denies blurriness of vision Ears, nose, mouth, throat, and face: Denies mucositis or sore throat Respiratory: Denies cough, dyspnea or  wheezes Cardiovascular: Denies palpitation, chest discomfort or lower extremity swelling Gastrointestinal:  Denies nausea, heartburn or change in bowel habits Skin: Denies abnormal skin rashes Neurological:Denies numbness, tingling or new weaknesses Behavioral/Psych: Mood is stable, no new changes  All other systems were reviewed with the patient and are negative.  PHYSICAL EXAMINATION: ECOG PERFORMANCE STATUS: 0 - Asymptomatic  Filed Vitals:   08/30/13 1134  BP: 123/63  Pulse: 59  Temp:   Resp:    Filed Weights   08/30/13 1120  Weight: 231 lb 12.8 oz (105.144 kg)    GENERAL:alert, no distress and comfortable. She looked morbidly obese SKIN: skin color, texture, turgor are normal, no rashes or significant lesions. She multiple process but they are improving EYES: normal, Conjunctiva are pink and non-injected, sclera clear OROPHARYNX:no exudate, no erythema and lips, buccal mucosa, and tongue normal  NECK: supple, thyroid normal size, non-tender, without nodularity LYMPH:  no palpable lymphadenopathy in the cervical, axillary or inguinal. This has improved compared to previous exam LUNGS: clear to auscultation and percussion with normal breathing effort HEART: regular rate & rhythm and no murmurs and no lower extremity edema ABDOMEN:abdomen soft, non-tender and normal bowel sounds Musculoskeletal:no cyanosis of digits and no clubbing  NEURO: alert & oriented x 3 with fluent speech, no focal motor/sensory deficits  LABORATORY DATA:  I have reviewed the data as listed    Component Value Date/Time   NA 142 08/30/2013 1117   NA 141 05/19/2012 1036   K 4.2 08/30/2013 1117   K 4.3 05/19/2012 1036   CL 98 02/02/2013 1353   CL 102 05/19/2012 1036   CO2 29 08/30/2013 1117   CO2 30 05/19/2012 1036   GLUCOSE 96 08/30/2013 1117   GLUCOSE 99 02/02/2013 1353   GLUCOSE 85 05/19/2012 1036   BUN 22.3 08/30/2013 1117   BUN 21 05/19/2012 1036   CREATININE 1.0 08/30/2013 1117   CREATININE 0.96  05/19/2012 1036   CALCIUM 9.7 08/30/2013 1117   CALCIUM 9.2 05/19/2012 1036   PROT 6.3* 08/30/2013 1117   PROT 5.7* 05/19/2012 1036   ALBUMIN 4.2 08/30/2013 1117   ALBUMIN 4.2 05/19/2012 1036   AST 13 08/30/2013 1117   AST 20 05/19/2012 1036   ALT 8 08/30/2013 1117   ALT 22 05/19/2012 1036   ALKPHOS 61 08/30/2013 1117   ALKPHOS 106 05/19/2012 1036   BILITOT 0.66 08/30/2013 1117   BILITOT 0.5 05/19/2012 1036   GFRNONAA 56* 08/31/2010 1429   GFRAA  Value: >60        The eGFR has been calculated using the MDRD equation. This calculation has not been validated in all clinical situations. eGFR's persistently <60 mL/min signify possible Chronic Kidney Disease. 08/31/2010 1429  No results found for this basename: SPEP,  UPEP,   kappa and lambda light chains    Lab Results  Component Value Date   WBC 253.0* 08/30/2013   NEUTROABS 17.0* 08/30/2013   HGB 10.5* 08/30/2013   HCT 32.7* 08/30/2013   MCV 88.8 08/30/2013   PLT 105* 08/30/2013      Chemistry      Component Value Date/Time   NA 142 08/30/2013 1117   NA 141 05/19/2012 1036   K 4.2 08/30/2013 1117   K 4.3 05/19/2012 1036   CL 98 02/02/2013 1353   CL 102 05/19/2012 1036   CO2 29 08/30/2013 1117   CO2 30 05/19/2012 1036   BUN 22.3 08/30/2013 1117   BUN 21 05/19/2012 1036   CREATININE 1.0 08/30/2013 1117   CREATININE 0.96 05/19/2012 1036      Component Value Date/Time   CALCIUM 9.7 08/30/2013 1117   CALCIUM 9.2 05/19/2012 1036   ALKPHOS 61 08/30/2013 1117   ALKPHOS 106 05/19/2012 1036   AST 13 08/30/2013 1117   AST 20 05/19/2012 1036   ALT 8 08/30/2013 1117   ALT 22 05/19/2012 1036   BILITOT 0.66 08/30/2013 1117   BILITOT 0.5 05/19/2012 1036     ASSESSMENT & PLAN:  #1 CLL #2 progressive leukocytosis #3 anemia #4 thrombocytopenia #5 extensive bruises Even though she has extensive bruises and progressive leukocytosis, thrombocytopenia is mildly improving. She has not experienced ill side effects from CLL. Lymphocytosis has been described in the  publication from [place] [employer] and was observed in 69% of the participants in the initial research trial Overall, with reduction in the size of the lymphadenopathy and improvement of her platelet counts, I felt that she is responding to chemotherapy. She just started on the chemotherapy less than a month ago. I am encouraged to see that she had minimal side effects from treatment apart from extensive bruises. The bruises are related to her thrombocytopenia which in fact is improving while on treatment. I will continue to see her on a weekly basis due to her comorbidities and her significant blood count abnormalities. At present level, she does not require platelet transfusion or blood transfusions for her thrombocytopenia and anemia.  Orders Placed This Encounter  Procedures  . CBC & Diff and Retic    Standing Status: Future     Number of Occurrences:      Standing Expiration Date: 08/30/2014  . Comprehensive metabolic panel    Standing Status: Future     Number of Occurrences:      Standing Expiration Date: 08/30/2014  . Lactate dehydrogenase    Standing Status: Future     Number of Occurrences:      Standing Expiration Date: 08/30/2014   All questions were answered. The patient knows to call the clinic with any problems, questions or concerns. No barriers to learning was detected. I spent 25 minutes counseling the patient face to face. The total time spent in the appointment was 40 minutes and more than 50% was on counseling and review of test results     [facility], Lucian Baswell, MD 08/30/2013 12:33 PM

## 2013-09-07 ENCOUNTER — Telehealth: Payer: Self-pay | Admitting: Hematology and Oncology

## 2013-09-07 ENCOUNTER — Ambulatory Visit (HOSPITAL_BASED_OUTPATIENT_CLINIC_OR_DEPARTMENT_OTHER): Payer: Medicare Other | Admitting: Hematology and Oncology

## 2013-09-07 ENCOUNTER — Other Ambulatory Visit (HOSPITAL_BASED_OUTPATIENT_CLINIC_OR_DEPARTMENT_OTHER): Payer: Medicare Other | Admitting: Lab

## 2013-09-07 ENCOUNTER — Encounter: Payer: Self-pay | Admitting: Hematology and Oncology

## 2013-09-07 VITALS — BP 140/76 | HR 62 | Temp 97.8°F | Resp 20 | Ht 63.0 in | Wt 233.3 lb

## 2013-09-07 DIAGNOSIS — D72829 Elevated white blood cell count, unspecified: Secondary | ICD-10-CM

## 2013-09-07 DIAGNOSIS — C9190 Lymphoid leukemia, unspecified not having achieved remission: Secondary | ICD-10-CM

## 2013-09-07 DIAGNOSIS — D649 Anemia, unspecified: Secondary | ICD-10-CM

## 2013-09-07 DIAGNOSIS — D696 Thrombocytopenia, unspecified: Secondary | ICD-10-CM

## 2013-09-07 DIAGNOSIS — C9110 Chronic lymphocytic leukemia of B-cell type not having achieved remission: Secondary | ICD-10-CM

## 2013-09-07 LAB — COMPREHENSIVE METABOLIC PANEL (CC13)
ALT: 8 U/L (ref 0–55)
AST: 14 U/L (ref 5–34)
Albumin: 4.1 g/dL (ref 3.5–5.0)
Alkaline Phosphatase: 65 U/L (ref 40–150)
Anion Gap: 9 mEq/L (ref 3–11)
BUN: 23.2 mg/dL (ref 7.0–26.0)
CO2: 29 mEq/L (ref 22–29)
Calcium: 9.5 mg/dL (ref 8.4–10.4)
Chloride: 103 mEq/L (ref 98–109)
Creatinine: 1 mg/dL (ref 0.6–1.1)
Glucose: 90 mg/dl (ref 70–140)
Potassium: 4 mEq/L (ref 3.5–5.1)
Sodium: 140 mEq/L (ref 136–145)
Total Bilirubin: 0.64 mg/dL (ref 0.20–1.20)
Total Protein: 6.3 g/dL — ABNORMAL LOW (ref 6.4–8.3)

## 2013-09-07 LAB — CBC & DIFF AND RETIC
BASO%: 0.8 % (ref 0.0–2.0)
Basophils Absolute: 2.2 10*3/uL — ABNORMAL HIGH (ref 0.0–0.1)
EOS%: 0 % (ref 0.0–7.0)
Eosinophils Absolute: 0.1 10*3/uL (ref 0.0–0.5)
HCT: 36.9 % (ref 34.8–46.6)
HGB: 10.6 g/dL — ABNORMAL LOW (ref 11.6–15.9)
Immature Retic Fract: 13.7 % — ABNORMAL HIGH (ref 1.60–10.00)
LYMPH%: 96.5 % — ABNORMAL HIGH (ref 14.0–49.7)
MCH: 27 pg (ref 25.1–34.0)
MCHC: 28.7 g/dL — ABNORMAL LOW (ref 31.5–36.0)
MCV: 94.1 fL (ref 79.5–101.0)
MONO#: 4.4 10*3/uL — ABNORMAL HIGH (ref 0.1–0.9)
MONO%: 1.7 % (ref 0.0–14.0)
NEUT#: 2.4 10*3/uL (ref 1.5–6.5)
NEUT%: 1 % — ABNORMAL LOW (ref 38.4–76.8)
Platelets: 107 10*3/uL — ABNORMAL LOW (ref 145–400)
RBC: 3.92 10*6/uL (ref 3.70–5.45)
Retic %: 1.77 % (ref 0.70–2.10)
Retic Ct Abs: 69.38 10*3/uL (ref 33.70–90.70)
WBC: 263.1 10*3/uL (ref 3.9–10.3)
lymph#: 254 10*3/uL — ABNORMAL HIGH (ref 0.9–3.3)
nRBC: 0 % (ref 0–0)

## 2013-09-07 LAB — LACTATE DEHYDROGENASE (CC13): LDH: 116 U/L — ABNORMAL LOW (ref 125–245)

## 2013-09-07 LAB — TECHNOLOGIST REVIEW

## 2013-09-07 NOTE — Progress Notes (Signed)
[facility] OFFICE PROGRESS NOTE  Patient Care Team: [name], MD as PCP - General (Family Medicine)  DIAGNOSIS: CLL, stage IV disease  SUMMARY OF ONCOLOGIC HISTORY:   CLL (chronic lymphoid leukemia) with failed remission   07/02/2010 Procedure LN biopsy confirmed CLL   08/18/2010 Procedure Bone marrow biopsy confirmed CLL   11/18/2010 - 04/15/2011 Chemotherapy patient received 6 cycles of FLudarabine and Rituximab   01/13/2012 Relapse/Recurrence Repeat BM biopsy confirmed relapse   01/27/2012 - 06/23/2012 Chemotherapy Bendamustine & Rituximab given, complicated by infusion reaction to Rituximab, completed 6 cycles   06/29/2013 Imaging Ct scan confirmed disease relapse with bulky lymphadenopathy   07/23/2013 Procedure Repeat BM biopsy due to thrombocytopenia and progressive luekocytosis   08/02/2013 Relapse/Recurrence Patient consented to start iburitinib as salvage therapy for relapsed CLL    INTERVAL HISTORY: Erin Moore 67 y.o. female returns for further followup. She complained of less bruising She had great energy level and great appetite. Denies any new lymphadenopathy. She denies any recent fever, chills, night sweats or abnormal weight loss  I have reviewed the past medical history, past surgical history, social history and family history with the patient and they are unchanged from previous note.  ALLERGIES:  is allergic to azithromycin and ibuprofen.  MEDICATIONS:  Current Outpatient Prescriptions  Medication Sig Dispense Refill  . acyclovir (ZOVIRAX) 400 MG tablet Take 400 mg by mouth daily.      Marland Kitchen allopurinol (ZYLOPRIM) 300 MG tablet Take 300 mg by mouth every morning.      . Carboxymethylcellul-Glycerin (REFRESH OPTIVE SENSITIVE OP) Place 1 drop into both eyes 2 (two) times daily.       . Cholecalciferol 2000 UNITS CAPS Take 1 capsule by mouth 3 (three) times daily.      . citalopram (CELEXA) 20 MG tablet Take 20 mg by mouth every morning.       .  Coenzyme Q10 50 MG CAPS Take 1 capsule by mouth every evening.      . ibrutinib (IMBRUVICA) 140 MG capsul Take 3 capsules (420 mg total) by mouth daily.  90 capsule  0  . LECITHIN PO Take 4 tablets by mouth daily.       Marland Kitchen losartan-hydrochlorothiazide (HYZAAR) 100-12.5 MG per tablet Take 1 tablet by mouth every morning.       . magnesium oxide (MAG-OX) 400 MG tablet Take 400 mg by mouth daily.      Marland Kitchen OVER THE COUNTER MEDICATION Take 2 tablets by mouth daily. IMMUNE STIMULATOR      . OVER THE COUNTER MEDICATION Take 2 tablets by mouth daily. PROBIOTIC 11      . OVER THE COUNTER MEDICATION Take 1 Can by mouth every Monday, Wednesday, and Friday. WHEY PROTEIN DRINKS      . OVER THE COUNTER MEDICATION Take 10 mLs by mouth daily with lunch. Liquid Chlorophyll      . OVER THE COUNTER MEDICATION Take 2 tablets by mouth 2 (two) times daily. Cordycepts      . OVER THE COUNTER MEDICATION Take 2 tablets by mouth 2 (two) times daily. Skeletal Strength      . OVER THE COUNTER MEDICATION Take 1 tablet by mouth 2 (two) times daily. B Right      . OVER THE COUNTER MEDICATION Take 1 tablet by mouth 3 (three) times daily. Paud Arco      . OVER THE COUNTER MEDICATION Take 1 tablet by mouth daily. Stress relief      .  Potassium 99 MG TABS Take 1 tablet by mouth 2 (two) times daily.      Marland Kitchen UNABLE TO FIND Take 1 tablet by mouth 3 (three) times daily. Protease Plus       No current facility-administered medications for this visit.   Facility-Administered Medications Ordered in Other Visits  Medication Dose Route Frequency Provider Last Rate Last Dose  . LORazepam (ATIVAN) tablet 0.5 mg  0.5 mg Oral UD Lennis P Livesay, MD   0.5 mg at 01/27/12 1011    REVIEW OF SYSTEMS:   Constitutional: Denies fevers, chills or abnormal weight loss Behavioral/Psych: Mood is stable, no new changes  All other systems were reviewed with the patient and are negative.  PHYSICAL EXAMINATION: ECOG PERFORMANCE STATUS: 0 -  Asymptomatic  Filed Vitals:   09/07/13 1031  BP: 140/76  Pulse: 62  Temp:   Resp:    Filed Weights   09/07/13 1027  Weight: 233 lb 4.8 oz (105.824 kg)    GENERAL:alert, no distress and comfortable. She is morbidly obese SKIN: skin color, texture, turgor are normal, no rashes or significant lesions. No to well healed bruises EYES: normal, Conjunctiva are pink and non-injected, sclera clear OROPHARYNX:no exudate, no erythema and lips, buccal mucosa, and tongue normal  NECK: supple, thyroid normal size, non-tender, without nodularity LYMPH:  no palpable lymphadenopathy in the cervical, axillary or inguinal LUNGS: clear to auscultation and percussion with normal breathing effort HEART: regular rate & rhythm and no murmurs and no lower extremity edema ABDOMEN:abdomen soft, non-tender and normal bowel sounds Musculoskeletal:no cyanosis of digits and no clubbing  NEURO: alert & oriented x 3 with fluent speech, no focal motor/sensory deficits  LABORATORY DATA:  I have reviewed the data as listed    Component Value Date/Time   NA 142 08/30/2013 1117   NA 141 05/19/2012 1036   K 4.2 08/30/2013 1117   K 4.3 05/19/2012 1036   CL 98 02/02/2013 1353   CL 102 05/19/2012 1036   CO2 29 08/30/2013 1117   CO2 30 05/19/2012 1036   GLUCOSE 96 08/30/2013 1117   GLUCOSE 99 02/02/2013 1353   GLUCOSE 85 05/19/2012 1036   BUN 22.3 08/30/2013 1117   BUN 21 05/19/2012 1036   CREATININE 1.0 08/30/2013 1117   CREATININE 0.96 05/19/2012 1036   CALCIUM 9.7 08/30/2013 1117   CALCIUM 9.2 05/19/2012 1036   PROT 6.3* 08/30/2013 1117   PROT 5.7* 05/19/2012 1036   ALBUMIN 4.2 08/30/2013 1117   ALBUMIN 4.2 05/19/2012 1036   AST 13 08/30/2013 1117   AST 20 05/19/2012 1036   ALT 8 08/30/2013 1117   ALT 22 05/19/2012 1036   ALKPHOS 61 08/30/2013 1117   ALKPHOS 106 05/19/2012 1036   BILITOT 0.66 08/30/2013 1117   BILITOT 0.5 05/19/2012 1036   GFRNONAA 56* 08/31/2010 1429   GFRAA  Value: >60        The eGFR has been calculated  using the MDRD equation. This calculation has not been validated in all clinical situations. eGFR's persistently <60 mL/min signify possible Chronic Kidney Disease. 08/31/2010 1429    No results found for this basename: SPEP,  UPEP,   kappa and lambda light chains    Lab Results  Component Value Date   WBC 263.1* 09/07/2013   NEUTROABS 2.4 09/07/2013   HGB 10.6* 09/07/2013   HCT 36.9 09/07/2013   MCV 94.1 09/07/2013   PLT 107* 09/07/2013      Chemistry      Component Value Date/Time  NA 142 08/30/2013 1117   NA 141 05/19/2012 1036   K 4.2 08/30/2013 1117   K 4.3 05/19/2012 1036   CL 98 02/02/2013 1353   CL 102 05/19/2012 1036   CO2 29 08/30/2013 1117   CO2 30 05/19/2012 1036   BUN 22.3 08/30/2013 1117   BUN 21 05/19/2012 1036   CREATININE 1.0 08/30/2013 1117   CREATININE 0.96 05/19/2012 1036      Component Value Date/Time   CALCIUM 9.7 08/30/2013 1117   CALCIUM 9.2 05/19/2012 1036   ALKPHOS 61 08/30/2013 1117   ALKPHOS 106 05/19/2012 1036   AST 13 08/30/2013 1117   AST 20 05/19/2012 1036   ALT 8 08/30/2013 1117   ALT 22 05/19/2012 1036   BILITOT 0.66 08/30/2013 1117   BILITOT 0.5 05/19/2012 1036     ASSESSMENT & PLAN:  #1 CLL #2 progressive leukocytosis #3 anemia #4 thrombocytopenia #5 extensive bruises Even though she has extensive bruises and progressive leukocytosis, thrombocytopenia is mildly improving. She has not experienced ill side effects from CLL. Lymphocytosis has been described in the publication from [place] [employer] and was observed in 69% of the participants in the initial research trial Overall, with reduction in the size of the lymphadenopathy and improvement of her platelet counts, I felt that she is responding to chemotherapy. She just started on the chemotherapy a month ago. I am encouraged to see that she had minimal side effects from treatment apart from extensive bruises. The bruises are related to her thrombocytopenia which in fact is improving  while on treatment. At present level, she does not require platelet transfusion or blood transfusions for her thrombocytopenia and anemia. At the 2 months month, I will consider ordering CT scan to evaluate disease response Orders Placed This Encounter  Procedures  . CBC with Differential    Standing Status: Future     Number of Occurrences:      Standing Expiration Date: 05/30/2014  . Comprehensive metabolic panel    Standing Status: Future     Number of Occurrences:      Standing Expiration Date: 09/07/2014  . Lactate dehydrogenase    Standing Status: Future     Number of Occurrences:      Standing Expiration Date: 09/07/2014   All questions were answered. The patient knows to call the clinic with any problems, questions or concerns. No barriers to learning was detected. I spent 15 minutes counseling the patient face to face. The total time spent in the appointment was 20 minutes and more than 50% was on counseling and review of test results     [facility], Durelle Zepeda, MD 09/07/2013 10:51 AM

## 2013-09-07 NOTE — Telephone Encounter (Signed)
Gave pt appt appt for lab and md for december 2014

## 2013-09-18 ENCOUNTER — Telehealth: Payer: Self-pay | Admitting: Hematology and Oncology

## 2013-09-18 ENCOUNTER — Encounter: Payer: Self-pay | Admitting: Hematology and Oncology

## 2013-09-18 ENCOUNTER — Ambulatory Visit (HOSPITAL_BASED_OUTPATIENT_CLINIC_OR_DEPARTMENT_OTHER): Payer: Medicare Other | Admitting: Hematology and Oncology

## 2013-09-18 ENCOUNTER — Other Ambulatory Visit (HOSPITAL_BASED_OUTPATIENT_CLINIC_OR_DEPARTMENT_OTHER): Payer: Medicare Other | Admitting: Lab

## 2013-09-18 VITALS — BP 127/64 | HR 69 | Temp 98.1°F | Resp 18 | Ht 63.0 in | Wt 235.4 lb

## 2013-09-18 DIAGNOSIS — C9190 Lymphoid leukemia, unspecified not having achieved remission: Secondary | ICD-10-CM

## 2013-09-18 DIAGNOSIS — D709 Neutropenia, unspecified: Secondary | ICD-10-CM

## 2013-09-18 DIAGNOSIS — C9110 Chronic lymphocytic leukemia of B-cell type not having achieved remission: Secondary | ICD-10-CM

## 2013-09-18 DIAGNOSIS — D649 Anemia, unspecified: Secondary | ICD-10-CM

## 2013-09-18 DIAGNOSIS — J029 Acute pharyngitis, unspecified: Secondary | ICD-10-CM

## 2013-09-18 DIAGNOSIS — D696 Thrombocytopenia, unspecified: Secondary | ICD-10-CM

## 2013-09-18 DIAGNOSIS — D72829 Elevated white blood cell count, unspecified: Secondary | ICD-10-CM

## 2013-09-18 HISTORY — DX: Acute pharyngitis, unspecified: J02.9

## 2013-09-18 LAB — CBC WITH DIFFERENTIAL/PLATELET
BASO%: 0.1 % (ref 0.0–2.0)
Basophils Absolute: 0.1 10*3/uL (ref 0.0–0.1)
EOS%: 0.2 % (ref 0.0–7.0)
Eosinophils Absolute: 0.4 10*3/uL (ref 0.0–0.5)
HCT: 34.8 % (ref 34.8–46.6)
HGB: 11 g/dL — ABNORMAL LOW (ref 11.6–15.9)
LYMPH%: 93.1 % — ABNORMAL HIGH (ref 14.0–49.7)
MCH: 28.6 pg (ref 25.1–34.0)
MCHC: 31.7 g/dL (ref 31.5–36.0)
MCV: 90.4 fL (ref 79.5–101.0)
MONO#: 3.6 10*3/uL — ABNORMAL HIGH (ref 0.1–0.9)
MONO%: 1.6 % (ref 0.0–14.0)
NEUT#: 11.2 10*3/uL — ABNORMAL HIGH (ref 1.5–6.5)
NEUT%: 5 % — ABNORMAL LOW (ref 38.4–76.8)
Platelets: 110 10*3/uL — ABNORMAL LOW (ref 145–400)
RBC: 3.85 10*6/uL (ref 3.70–5.45)
RDW: 16 % — ABNORMAL HIGH (ref 11.2–14.5)
WBC: 225.9 10*3/uL (ref 3.9–10.3)
lymph#: 210.6 10*3/uL — ABNORMAL HIGH (ref 0.9–3.3)

## 2013-09-18 LAB — COMPREHENSIVE METABOLIC PANEL (CC13)
ALT: 14 U/L (ref 0–55)
AST: 18 U/L (ref 5–34)
Albumin: 4.2 g/dL (ref 3.5–5.0)
Alkaline Phosphatase: 62 U/L (ref 40–150)
Anion Gap: 13 mEq/L — ABNORMAL HIGH (ref 3–11)
BUN: 19.8 mg/dL (ref 7.0–26.0)
CO2: 31 mEq/L — ABNORMAL HIGH (ref 22–29)
Calcium: 9.8 mg/dL (ref 8.4–10.4)
Chloride: 99 mEq/L (ref 98–109)
Creatinine: 1 mg/dL (ref 0.6–1.1)
Glucose: 92 mg/dl (ref 70–140)
Potassium: 4.3 mEq/L (ref 3.5–5.1)
Sodium: 142 mEq/L (ref 136–145)
Total Bilirubin: 0.63 mg/dL (ref 0.20–1.20)
Total Protein: 6.3 g/dL — ABNORMAL LOW (ref 6.4–8.3)

## 2013-09-18 LAB — LACTATE DEHYDROGENASE (CC13): LDH: 132 U/L (ref 125–245)

## 2013-09-18 MED ORDER — AMOXICILLIN-POT CLAVULANATE 875-125 MG PO TABS: 1.0000 | ORAL_TABLET | Freq: Two times a day (BID) | ORAL | Status: DC

## 2013-09-18 NOTE — Progress Notes (Signed)
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  SUMMARY OF ONCOLOGIC HISTORY:   CLL (chronic lymphoid leukemia) with failed remission   07/02/2010 Procedure LN biopsy confirmed CLL   08/18/2010 Procedure Bone marrow biopsy confirmed CLL   11/18/2010 - 04/15/2011 Chemotherapy patient received 6 cycles of FLudarabine and Rituximab   01/13/2012 Relapse/Recurrence Repeat BM biopsy confirmed relapse   01/27/2012 - 06/23/2012 Chemotherapy Bendamustine & Rituximab given, complicated by infusion reaction to Rituximab, completed 6 cycles   06/29/2013 Imaging Ct scan confirmed disease relapse with bulky lymphadenopathy   07/23/2013 Procedure Repeat BM biopsy due to thrombocytopenia and progressive luekocytosis   08/02/2013 Relapse/Recurrence Patient consented to start iburitinib as salvage therapy for relapsed CLL    INTERVAL HISTORY: Erin Moore 67 y.o. female returns for further followup. She is doing well. The patient denies any recent signs or symptoms of bleeding such as spontaneous epistaxis, hematuria or hematochezia. Her only main complaint is sore throat for 2 days. Is associated with mild coughing but the cough is nonproductive. Denies any shortness of breath. No hemoptysis. She denies any fevers or chills. Diarrhea has resolved recently.  I have reviewed the past medical history, past surgical history, social history and family history with the patient and they are unchanged from previous note.  ALLERGIES:  is allergic to azithromycin and ibuprofen.  MEDICATIONS:  Current Outpatient Prescriptions  Medication Sig Dispense Refill  . acyclovir (ZOVIRAX) 400 MG tablet Take 400 mg by mouth daily.      Marland Kitchen allopurinol (ZYLOPRIM) 300 MG tablet Take 300 mg by mouth every morning.      . Carboxymethylcellul-Glycerin (REFRESH OPTIVE SENSITIVE OP) Place 1 drop into both eyes 2  (two) times daily.       . Cholecalciferol 2000 UNITS CAPS Take 1 capsule by mouth 3 (three) times daily.      . citalopram (CELEXA) 20 MG tablet Take 20 mg by mouth every morning.       . Coenzyme Q10 50 MG CAPS Take 1 capsule by mouth every evening.      . ibrutinib (IMBRUVICA) 140 MG capsul Take 3 capsules (420 mg total) by mouth daily.  90 capsule  0  . LECITHIN PO Take 4 tablets by mouth daily.       Marland Kitchen losartan-hydrochlorothiazide (HYZAAR) 100-12.5 MG per tablet Take 1 tablet by mouth every morning.       . magnesium oxide (MAG-OX) 400 MG tablet Take 400 mg by mouth daily.      Marland Kitchen OVER THE COUNTER MEDICATION Take 2 tablets by mouth daily. IMMUNE STIMULATOR      . OVER THE COUNTER MEDICATION Take 2 tablets by mouth daily. PROBIOTIC 11      . OVER THE COUNTER MEDICATION Take 1 Can by mouth every Monday, Wednesday, and Friday. WHEY PROTEIN DRINKS      . OVER THE COUNTER MEDICATION Take 10 mLs by mouth daily with lunch. Liquid Chlorophyll      . OVER THE COUNTER MEDICATION Take 2 tablets by mouth 2 (two) times daily. Cordycepts      . OVER THE COUNTER MEDICATION Take 2 tablets by mouth 2 (two) times daily. Skeletal Strength      . OVER THE COUNTER MEDICATION Take 1 tablet by mouth 2 (two) times daily. B Right      . OVER THE COUNTER MEDICATION Take 1 tablet by  mouth 3 (three) times daily. Paud Arco      . OVER THE COUNTER MEDICATION Take 1 tablet by mouth daily. Stress relief      . Potassium 99 MG TABS Take 1 tablet by mouth 2 (two) times daily.      Marland Kitchen UNABLE TO FIND Take 1 tablet by mouth 3 (three) times daily. Protease Plus      . amoxicillin-clavulanate (AUGMENTIN) 875-125 MG per tablet Take 1 tablet by mouth 2 (two) times daily.  14 tablet  0   No current facility-administered medications for this visit.   Facility-Administered Medications Ordered in Other Visits  Medication Dose Route Frequency Provider Last Rate Last Dose  . LORazepam (ATIVAN) tablet 0.5 mg  0.5 mg Oral UD Lennis Buzzy Han, MD   0.5 mg at 01/27/12 1011    REVIEW OF SYSTEMS:   Eyes: Denies blurriness of vision Respiratory: Denies cough, dyspnea or wheezes Cardiovascular: Denies palpitation, chest discomfort or lower extremity swelling Gastrointestinal:  Denies nausea, heartburn or change in bowel habits Skin: Denies abnormal skin rashes Lymphatics: Denies new lymphadenopathy or easy bruising Neurological:Denies numbness, tingling or new weaknesses Behavioral/Psych: Mood is stable, no new changes  All other systems were reviewed with the patient and are negative.  PHYSICAL EXAMINATION: ECOG PERFORMANCE STATUS: 0 - Asymptomatic  Filed Vitals:   09/18/13 0920  BP: 127/64  Pulse: 69  Temp:   Resp:    Filed Weights   09/18/13 0915  Weight: 235 lb 6.4 oz (106.777 kg)    GENERAL:alert, no distress and comfortable. She is morbidly obese SKIN: skin color, texture, turgor are normal, no rashes or significant lesions noted some old bruises but nothing new EYES: normal, Conjunctiva are pink and non-injected, sclera clear OROPHARYNX:no exudate, no erythema and lips, buccal mucosa, and tongue normal  NECK: supple, thyroid normal size, non-tender, without nodularity LYMPH:  no palpable lymphadenopathy in the cervical, axillary or inguinal LUNGS: clear to auscultation and percussion with normal breathing effort HEART: regular rate & rhythm and no murmurs and no lower extremity edema ABDOMEN:abdomen soft, non-tender and normal bowel sounds Musculoskeletal:no cyanosis of digits and no clubbing  NEURO: alert & oriented x 3 with fluent speech, no focal motor/sensory deficits  LABORATORY DATA:  I have reviewed the data as listed    Component Value Date/Time   NA 142 09/18/2013 0903   NA 141 05/19/2012 1036   K 4.3 09/18/2013 0903   K 4.3 05/19/2012 1036   CL 98 02/02/2013 1353   CL 102 05/19/2012 1036   CO2 31* 09/18/2013 0903   CO2 30 05/19/2012 1036   GLUCOSE 92 09/18/2013 0903   GLUCOSE 99 02/02/2013 1353    GLUCOSE 85 05/19/2012 1036   BUN 19.8 09/18/2013 0903   BUN 21 05/19/2012 1036   CREATININE 1.0 09/18/2013 0903   CREATININE 0.96 05/19/2012 1036   CALCIUM 9.8 09/18/2013 0903   CALCIUM 9.2 05/19/2012 1036   PROT 6.3* 09/18/2013 0903   PROT 5.7* 05/19/2012 1036   ALBUMIN 4.2 09/18/2013 0903   ALBUMIN 4.2 05/19/2012 1036   AST 18 09/18/2013 0903   AST 20 05/19/2012 1036   ALT 14 09/18/2013 0903   ALT 22 05/19/2012 1036   ALKPHOS 62 09/18/2013 0903   ALKPHOS 106 05/19/2012 1036   BILITOT 0.63 09/18/2013 0903   BILITOT 0.5 05/19/2012 1036   GFRNONAA 56* 08/31/2010 1429   GFRAA  Value: >60        The eGFR has been calculated using the  MDRD equation. This calculation has not been validated in all clinical situations. eGFR's persistently <60 mL/min signify possible Chronic Kidney Disease. 08/31/2010 1429    No results found for this basename: SPEP, UPEP,  kappa and lambda light chains    Lab Results  Component Value Date   WBC 225.9* 09/18/2013   NEUTROABS 11.2* 09/18/2013   HGB 11.0* 09/18/2013   HCT 34.8 09/18/2013   MCV 90.4 09/18/2013   PLT 110* 09/18/2013      Chemistry      Component Value Date/Time   NA 142 09/18/2013 0903   NA 141 05/19/2012 1036   K 4.3 09/18/2013 0903   K 4.3 05/19/2012 1036   CL 98 02/02/2013 1353   CL 102 05/19/2012 1036   CO2 31* 09/18/2013 0903   CO2 30 05/19/2012 1036   BUN 19.8 09/18/2013 0903   BUN 21 05/19/2012 1036   CREATININE 1.0 09/18/2013 0903   CREATININE 0.96 05/19/2012 1036      Component Value Date/Time   CALCIUM 9.8 09/18/2013 0903   CALCIUM 9.2 05/19/2012 1036   ALKPHOS 62 09/18/2013 0903   ALKPHOS 106 05/19/2012 1036   AST 18 09/18/2013 0903   AST 20 05/19/2012 1036   ALT 14 09/18/2013 0903   ALT 22 05/19/2012 1036   BILITOT 0.63 09/18/2013 0903   BILITOT 0.5 05/19/2012 1036      ASSESSMENT & PLAN:  #1 CLL She has significant good response to treatment. I recommend we continue without dose adjustment. I will repeat imaging study end of the month. Clinically she is  responding well. #2 anemia This is likely due to recent treatment. The patient denies recent history of bleeding such as epistaxis, hematuria or hematochezia. She is asymptomatic from the anemia. I will observe for now.  She does not require transfusion now. I will continue the chemotherapy at current dose without dosage adjustment.  If the anemia gets progressive worse in the future, I might have to delay her treatment or adjust the chemotherapy dose. #3 sore throat I suspect she may have infection. Technically the patient is neutropenic. I will go ahead and give her a prescription of Augmentin to take for one week. Side effects to be expected from antibiotics I discussed with the patient including risk of allergic reaction and diarrhea #4 thrombocytopenia This is likely due to her underlying disease. The patient denies recent history of bleeding such as epistaxis, hematuria or hematochezia. She is asymptomatic from the low platelet count. I will observe for now.  she does not require transfusion now. I will continue the chemotherapy at current dose without dosage adjustment.  If the thrombocytopenia gets progressive worse in the future, I might have to delay her treatment or adjust the chemotherapy dose. #5 extreme leukocytosis This is expected side effects from her chemotherapy. We will observe for now. It is already improving.  Orders Placed This Encounter  Procedures  . Comprehensive metabolic panel    Standing Status: Future     Number of Occurrences:      Standing Expiration Date: 09/18/2014  . CBC with Differential    Standing Status: Future     Number of Occurrences:      Standing Expiration Date: 06/10/2014  . Lactate dehydrogenase    Standing Status: Future     Number of Occurrences:      Standing Expiration Date: 09/18/2014   All questions were answered. The patient knows to call the clinic with any problems, questions or concerns. No barriers to  learning was detected.   Dariona Postma,  Hannan Tetzlaff, MD 09/18/2013 9:54 AM

## 2013-09-18 NOTE — Telephone Encounter (Signed)
appts made per 12/2 POF AVS and CAL given shh °

## 2013-09-20 ENCOUNTER — Encounter: Payer: Self-pay | Admitting: Hematology and Oncology

## 2013-09-20 ENCOUNTER — Encounter (INDEPENDENT_AMBULATORY_CARE_PROVIDER_SITE_OTHER): Payer: Self-pay

## 2013-09-20 ENCOUNTER — Other Ambulatory Visit: Payer: Self-pay | Admitting: Hematology and Oncology

## 2013-09-20 ENCOUNTER — Ambulatory Visit (HOSPITAL_BASED_OUTPATIENT_CLINIC_OR_DEPARTMENT_OTHER): Payer: Medicare Other | Admitting: Hematology and Oncology

## 2013-09-20 ENCOUNTER — Telehealth: Payer: Self-pay | Admitting: *Deleted

## 2013-09-20 VITALS — BP 138/75 | HR 81 | Temp 98.5°F | Resp 20 | Ht 63.0 in | Wt 233.4 lb

## 2013-09-20 DIAGNOSIS — J029 Acute pharyngitis, unspecified: Secondary | ICD-10-CM

## 2013-09-20 DIAGNOSIS — C9110 Chronic lymphocytic leukemia of B-cell type not having achieved remission: Secondary | ICD-10-CM

## 2013-09-20 DIAGNOSIS — R197 Diarrhea, unspecified: Secondary | ICD-10-CM

## 2013-09-20 MED ORDER — TRAMADOL HCL 50 MG PO TABS: 50.0000 mg | ORAL_TABLET | Freq: Four times a day (QID) | ORAL | Status: DC | PRN

## 2013-09-20 NOTE — Telephone Encounter (Signed)
Caregiver reports pt having "extreme diarrhea" since starting on Amoxicillin and sore throat worse.  Pt having difficulty swallowing and has temp 100.1 F.   Dr. Bertis Ruddy wants to see pt in clinic to evaluate.  Instructed caregiver to bring pt in as soon as she can get here.  She verbalized understanding.

## 2013-09-20 NOTE — Progress Notes (Signed)
[facility] OFFICE PROGRESS NOTE  Patient Care Team: [name], MD as PCP - General (Family Medicine)  DIAGNOSIS: urgent evaluation of severe diarrhea and sore throat  SUMMARY OF ONCOLOGIC HISTORY:   CLL (chronic lymphoid leukemia) with failed remission   07/02/2010 Procedure LN biopsy confirmed CLL   08/18/2010 Procedure Bone marrow biopsy confirmed CLL   11/18/2010 - 04/15/2011 Chemotherapy patient received 6 cycles of FLudarabine and Rituximab   01/13/2012 Relapse/Recurrence Repeat BM biopsy confirmed relapse   01/27/2012 - 06/23/2012 Chemotherapy Bendamustine & Rituximab given, complicated by infusion reaction to Rituximab, completed 6 cycles   06/29/2013 Imaging Ct scan confirmed disease relapse with bulky lymphadenopathy   07/23/2013 Procedure Repeat BM biopsy due to thrombocytopenia and progressive luekocytosis   08/02/2013 Relapse/Recurrence Patient consented to start iburitinib as salvage therapy for relapsed CLL    INTERVAL HISTORY: Erin Moore 67 y.o. female returns for urgent evaluation for severe diarrhea and sore throat. Patient was placed on Augmentin 2 days ago for sore throat and low grade fever. Her sore throat was not better. Diarrhea started this morning without association with crampy abdominal pain or passage of blood. She is able to eat a small amount of food and drink liquids. No chills.  I have reviewed the past medical history, past surgical history, social history and family history with the patient and they are unchanged from previous note.  ALLERGIES:  is allergic to azithromycin and ibuprofen.  MEDICATIONS:  Current Outpatient Prescriptions  Medication Sig Dispense Refill  . acyclovir (ZOVIRAX) 400 MG tablet Take 400 mg by mouth daily.      Marland Kitchen allopurinol (ZYLOPRIM) 300 MG tablet Take 300 mg by mouth every morning.      Marland Kitchen amoxicillin-clavulanate (AUGMENTIN) 875-125 MG per tablet Take 1 tablet by mouth 2 (two) times daily.  14 tablet  0  .  Carboxymethylcellul-Glycerin (REFRESH OPTIVE SENSITIVE OP) Place 1 drop into both eyes 2 (two) times daily.       . Cholecalciferol 2000 UNITS CAPS Take 1 capsule by mouth 3 (three) times daily.      . citalopram (CELEXA) 20 MG tablet Take 20 mg by mouth every morning.       . Coenzyme Q10 50 MG CAPS Take 1 capsule by mouth every evening.      . ibrutinib (IMBRUVICA) 140 MG capsul Take 3 capsules (420 mg total) by mouth daily.  90 capsule  0  . LECITHIN PO Take 4 tablets by mouth daily.       Marland Kitchen losartan-hydrochlorothiazide (HYZAAR) 100-12.5 MG per tablet Take 1 tablet by mouth every morning.       . magnesium oxide (MAG-OX) 400 MG tablet Take 400 mg by mouth daily.      Marland Kitchen OVER THE COUNTER MEDICATION Take 2 tablets by mouth daily. IMMUNE STIMULATOR      . OVER THE COUNTER MEDICATION Take 2 tablets by mouth daily. PROBIOTIC 11      . OVER THE COUNTER MEDICATION Take 1 Can by mouth every Monday, Wednesday, and Friday. WHEY PROTEIN DRINKS      . OVER THE COUNTER MEDICATION Take 10 mLs by mouth daily with lunch. Liquid Chlorophyll      . OVER THE COUNTER MEDICATION Take 2 tablets by mouth 2 (two) times daily. Cordycepts      . OVER THE COUNTER MEDICATION Take 2 tablets by mouth 2 (two) times daily. Skeletal Strength      . OVER THE COUNTER MEDICATION Take 1 tablet  by mouth 2 (two) times daily. B Right      . OVER THE COUNTER MEDICATION Take 1 tablet by mouth 3 (three) times daily. Paud Arco      . OVER THE COUNTER MEDICATION Take 1 tablet by mouth daily. Stress relief      . Potassium 99 MG TABS Take 1 tablet by mouth 2 (two) times daily.      . traMADol (ULTRAM) 50 MG tablet Take 1 tablet (50 mg total) by mouth every 6 (six) hours as needed for moderate pain.  60 tablet  0  . UNABLE TO FIND Take 1 tablet by mouth 3 (three) times daily. Protease Plus       No current facility-administered medications for this visit.   Facility-Administered Medications Ordered in Other Visits  Medication Dose Route  Frequency Provider Last Rate Last Dose  . LORazepam (ATIVAN) tablet 0.5 mg  0.5 mg Oral UD Lennis P Livesay, MD   0.5 mg at 01/27/12 1011    REVIEW OF SYSTEMS:   Constitutional: Denies fevers, chills or abnormal weight loss All other systems were reviewed with the patient and are negative.  PHYSICAL EXAMINATION: ECOG PERFORMANCE STATUS: 1 - Symptomatic but completely ambulatory  Filed Vitals:   09/20/13 1022  BP: 138/75  Pulse:   Temp:   Resp:    Filed Weights   09/20/13 1013  Weight: 233 lb 6.4 oz (105.87 kg)    GENERAL:alert, no distress and comfortable. She is morbidly obese SKIN: skin color, texture, turgor are normal, no rashes or significant lesions EYES: normal, Conjunctiva are pink and non-injected, sclera clear OROPHARYNX:no exudate, no erythema and lips, buccal mucosa, and tongue normal . No tonsillar exudate NECK: supple, thyroid normal size, non-tender, without nodularity LYMPH:  no palpable lymphadenopathy in the cervical, axillary or inguinal LUNGS: clear to auscultation and percussion with normal breathing effort HEART: regular rate & rhythm and no murmurs and no lower extremity edema ABDOMEN:abdomen soft, non-tender and normal bowel sounds Musculoskeletal:no cyanosis of digits and no clubbing  NEURO: alert & oriented x 3 with fluent speech, no focal motor/sensory deficits  LABORATORY DATA:  I have reviewed the data as listed    Component Value Date/Time   NA 142 09/18/2013 0903   NA 141 05/19/2012 1036   K 4.3 09/18/2013 0903   K 4.3 05/19/2012 1036   CL 98 02/02/2013 1353   CL 102 05/19/2012 1036   CO2 31* 09/18/2013 0903   CO2 30 05/19/2012 1036   GLUCOSE 92 09/18/2013 0903   GLUCOSE 99 02/02/2013 1353   GLUCOSE 85 05/19/2012 1036   BUN 19.8 09/18/2013 0903   BUN 21 05/19/2012 1036   CREATININE 1.0 09/18/2013 0903   CREATININE 0.96 05/19/2012 1036   CALCIUM 9.8 09/18/2013 0903   CALCIUM 9.2 05/19/2012 1036   PROT 6.3* 09/18/2013 0903   PROT 5.7* 05/19/2012 1036    ALBUMIN 4.2 09/18/2013 0903   ALBUMIN 4.2 05/19/2012 1036   AST 18 09/18/2013 0903   AST 20 05/19/2012 1036   ALT 14 09/18/2013 0903   ALT 22 05/19/2012 1036   ALKPHOS 62 09/18/2013 0903   ALKPHOS 106 05/19/2012 1036   BILITOT 0.63 09/18/2013 0903   BILITOT 0.5 05/19/2012 1036   GFRNONAA 56* 08/31/2010 1429   GFRAA  Value: >60        The eGFR has been calculated using the MDRD equation. This calculation has not been validated in all clinical situations. eGFR's persistently <60 mL/min signify possible Chronic Kidney  Disease. 08/31/2010 1429    No results found for this basename: SPEP, UPEP,  kappa and lambda light chains    Lab Results  Component Value Date   WBC 225.9* 09/18/2013   NEUTROABS 11.2* 09/18/2013   HGB 11.0* 09/18/2013   HCT 34.8 09/18/2013   MCV 90.4 09/18/2013   PLT 110* 09/18/2013      Chemistry      Component Value Date/Time   NA 142 09/18/2013 0903   NA 141 05/19/2012 1036   K 4.3 09/18/2013 0903   K 4.3 05/19/2012 1036   CL 98 02/02/2013 1353   CL 102 05/19/2012 1036   CO2 31* 09/18/2013 0903   CO2 30 05/19/2012 1036   BUN 19.8 09/18/2013 0903   BUN 21 05/19/2012 1036   CREATININE 1.0 09/18/2013 0903   CREATININE 0.96 05/19/2012 1036      Component Value Date/Time   CALCIUM 9.8 09/18/2013 0903   CALCIUM 9.2 05/19/2012 1036   ALKPHOS 62 09/18/2013 0903   ALKPHOS 106 05/19/2012 1036   AST 18 09/18/2013 0903   AST 20 05/19/2012 1036   ALT 14 09/18/2013 0903   ALT 22 05/19/2012 1036   BILITOT 0.63 09/18/2013 0903   BILITOT 0.5 05/19/2012 1036      ASSESSMENT & PLAN:  1) CLL Continue Ibrutinib 2) Diarrhea Could be due to antibiotics. Symptoms are not suggestive of C. Diff. I have asked her to increase oral fluid hydration and stop antibiotics. Will consider add Imodium if does not improve by tomorrow 3) severe sore Throat Not improving with oral antibiotics. Likely viral I have told patient to stop antibiotics and just treat with conservative measures. I gave her prescription tramadol for  severe sore throat All questions were answered. The patient knows to call the clinic with any problems, questions or concerns. No barriers to learning was detected. I spent 15 minutes counseling the patient face to face. The total time spent in the appointment was 20 minutes and more than 50% was on counseling and review of test results     [facility], Selina Tapper, MD 09/20/2013 10:37 AM

## 2013-09-24 ENCOUNTER — Other Ambulatory Visit: Payer: Self-pay | Admitting: *Deleted

## 2013-09-24 DIAGNOSIS — C9110 Chronic lymphocytic leukemia of B-cell type not having achieved remission: Secondary | ICD-10-CM

## 2013-09-24 DIAGNOSIS — R5383 Other fatigue: Secondary | ICD-10-CM

## 2013-09-24 DIAGNOSIS — C9190 Lymphoid leukemia, unspecified not having achieved remission: Secondary | ICD-10-CM

## 2013-09-24 DIAGNOSIS — D696 Thrombocytopenia, unspecified: Secondary | ICD-10-CM

## 2013-09-24 MED ORDER — IBRUTINIB 140 MG PO CAPS: 420.0000 mg | ORAL_CAPSULE | Freq: Every day | ORAL | Status: DC

## 2013-09-24 NOTE — Telephone Encounter (Signed)
THIS REQUEST REFILL FOR IMBRUVICA WAS PLACED IN DR.GORSUCH'S ACTIVE WORK FOLDER.

## 2013-09-24 NOTE — Addendum Note (Signed)
Addended by: Arvilla Meres on: 09/24/2013 04:24 PM   Modules accepted: Orders

## 2013-09-26 NOTE — Telephone Encounter (Signed)
[facility] sent facsimile confirmation of Imbruvica prescription shipment.  Imbruvica was shipped on 09-25-2013 with next business day delivery.

## 2013-10-04 ENCOUNTER — Ambulatory Visit (HOSPITAL_BASED_OUTPATIENT_CLINIC_OR_DEPARTMENT_OTHER): Payer: Medicare Other | Admitting: Hematology and Oncology

## 2013-10-04 ENCOUNTER — Telehealth: Payer: Self-pay | Admitting: Hematology and Oncology

## 2013-10-04 ENCOUNTER — Other Ambulatory Visit (HOSPITAL_BASED_OUTPATIENT_CLINIC_OR_DEPARTMENT_OTHER): Payer: Medicare Other

## 2013-10-04 ENCOUNTER — Encounter: Payer: Self-pay | Admitting: Hematology and Oncology

## 2013-10-04 VITALS — BP 127/65 | HR 73 | Temp 98.1°F | Resp 18 | Ht 63.0 in | Wt 236.2 lb

## 2013-10-04 DIAGNOSIS — R233 Spontaneous ecchymoses: Secondary | ICD-10-CM

## 2013-10-04 DIAGNOSIS — J3489 Other specified disorders of nose and nasal sinuses: Secondary | ICD-10-CM

## 2013-10-04 DIAGNOSIS — J029 Acute pharyngitis, unspecified: Secondary | ICD-10-CM

## 2013-10-04 DIAGNOSIS — D709 Neutropenia, unspecified: Secondary | ICD-10-CM

## 2013-10-04 DIAGNOSIS — R070 Pain in throat: Secondary | ICD-10-CM

## 2013-10-04 DIAGNOSIS — C9190 Lymphoid leukemia, unspecified not having achieved remission: Secondary | ICD-10-CM

## 2013-10-04 DIAGNOSIS — C9110 Chronic lymphocytic leukemia of B-cell type not having achieved remission: Secondary | ICD-10-CM

## 2013-10-04 DIAGNOSIS — D649 Anemia, unspecified: Secondary | ICD-10-CM

## 2013-10-04 DIAGNOSIS — D696 Thrombocytopenia, unspecified: Secondary | ICD-10-CM

## 2013-10-04 DIAGNOSIS — D72829 Elevated white blood cell count, unspecified: Secondary | ICD-10-CM

## 2013-10-04 LAB — CBC WITH DIFFERENTIAL/PLATELET
BASO%: 0.1 % (ref 0.0–2.0)
Basophils Absolute: 0.1 10*3/uL (ref 0.0–0.1)
EOS%: 0.2 % (ref 0.0–7.0)
Eosinophils Absolute: 0.4 10*3/uL (ref 0.0–0.5)
HCT: 32.4 % — ABNORMAL LOW (ref 34.8–46.6)
HGB: 10.6 g/dL — ABNORMAL LOW (ref 11.6–15.9)
LYMPH%: 91.5 % — ABNORMAL HIGH (ref 14.0–49.7)
MCH: 29 pg (ref 25.1–34.0)
MCHC: 32.7 g/dL (ref 31.5–36.0)
MCV: 88.5 fL (ref 79.5–101.0)
MONO#: 3.3 10*3/uL — ABNORMAL HIGH (ref 0.1–0.9)
MONO%: 1.6 % (ref 0.0–14.0)
NEUT#: 14.3 10*3/uL — ABNORMAL HIGH (ref 1.5–6.5)
NEUT%: 6.6 % — ABNORMAL LOW (ref 38.4–76.8)
Platelets: 120 10*3/uL — ABNORMAL LOW (ref 145–400)
RBC: 3.66 10*6/uL — ABNORMAL LOW (ref 3.70–5.45)
RDW: 16.1 % — ABNORMAL HIGH (ref 11.2–14.5)
WBC: 214.9 10*3/uL (ref 3.9–10.3)
lymph#: 196.8 10*3/uL — ABNORMAL HIGH (ref 0.9–3.3)

## 2013-10-04 LAB — COMPREHENSIVE METABOLIC PANEL (CC13)
ALT: 9 U/L (ref 0–55)
AST: 17 U/L (ref 5–34)
Albumin: 4 g/dL (ref 3.5–5.0)
Alkaline Phosphatase: 65 U/L (ref 40–150)
Anion Gap: 9 mEq/L (ref 3–11)
BUN: 21.3 mg/dL (ref 7.0–26.0)
CO2: 30 mEq/L — ABNORMAL HIGH (ref 22–29)
Calcium: 9.4 mg/dL (ref 8.4–10.4)
Chloride: 104 mEq/L (ref 98–109)
Creatinine: 0.9 mg/dL (ref 0.6–1.1)
Glucose: 91 mg/dl (ref 70–140)
Potassium: 3.8 mEq/L (ref 3.5–5.1)
Sodium: 143 mEq/L (ref 136–145)
Total Bilirubin: 0.63 mg/dL (ref 0.20–1.20)
Total Protein: 6.2 g/dL — ABNORMAL LOW (ref 6.4–8.3)

## 2013-10-04 LAB — LACTATE DEHYDROGENASE (CC13): LDH: 129 U/L (ref 125–245)

## 2013-10-04 LAB — TECHNOLOGIST REVIEW

## 2013-10-04 MED ORDER — CIPROFLOXACIN HCL 250 MG PO TABS: 250.0000 mg | ORAL_TABLET | Freq: Two times a day (BID) | ORAL | Status: DC

## 2013-10-04 NOTE — Telephone Encounter (Signed)
gtv and printed appt sched and avs for pt for Jan 2015

## 2013-10-04 NOTE — Progress Notes (Signed)
[facility] OFFICE PROGRESS NOTE  Patient Care Team: [name], MD as PCP - General (Family Medicine)  DIAGNOSIS: Stage IV CLL  SUMMARY OF ONCOLOGIC HISTORY:   CLL (chronic lymphoid leukemia) with failed remission   07/02/2010 Procedure LN biopsy confirmed CLL   08/18/2010 Procedure Bone marrow biopsy confirmed CLL   11/18/2010 - 04/15/2011 Chemotherapy patient received 6 cycles of FLudarabine and Rituximab   01/13/2012 Relapse/Recurrence Repeat BM biopsy confirmed relapse   01/27/2012 - 06/23/2012 Chemotherapy Bendamustine & Rituximab given, complicated by infusion reaction to Rituximab, completed 6 cycles   06/29/2013 Imaging Ct scan confirmed disease relapse with bulky lymphadenopathy   07/23/2013 Procedure Repeat BM biopsy due to thrombocytopenia and progressive luekocytosis   08/02/2013 Relapse/Recurrence Patient consented to start iburitinib as salvage therapy for relapsed CLL    INTERVAL HISTORY: Erin Moore 67 y.o. female returns for further followup. Recently, she took some extra nutritional supplements and developed significant and bruises and diarrhea. Since then should stop taking the supplement and her diarrhea resolved. She developed nasal congestion with deep productive cough. She denies any recent fever, chills, night sweats or abnormal weight loss The patient denies any recent signs or symptoms of bleeding such as spontaneous epistaxis, hematuria or hematochezia.  I have reviewed the past medical history, past surgical history, social history and family history with the patient and they are unchanged from previous note.  ALLERGIES:  is allergic to azithromycin and ibuprofen.  MEDICATIONS:  Current Outpatient Prescriptions  Medication Sig Dispense Refill  . acyclovir (ZOVIRAX) 400 MG tablet Take 400 mg by mouth daily.      Marland Kitchen allopurinol (ZYLOPRIM) 300 MG tablet Take 300 mg by mouth every morning.      . Carboxymethylcellul-Glycerin (REFRESH OPTIVE  SENSITIVE OP) Place 1 drop into both eyes 2 (two) times daily.       . Cholecalciferol 2000 UNITS CAPS Take 1 capsule by mouth 3 (three) times daily.      . citalopram (CELEXA) 20 MG tablet Take 20 mg by mouth every morning.       . Coenzyme Q10 50 MG CAPS Take 1 capsule by mouth every evening.      . ibrutinib (IMBRUVICA) 140 MG capsul Take 3 capsules (420 mg total) by mouth daily.  90 capsule  3  . LECITHIN PO Take 4 tablets by mouth daily.       Marland Kitchen losartan-hydrochlorothiazide (HYZAAR) 100-12.5 MG per tablet Take 1 tablet by mouth every morning.       . magnesium oxide (MAG-OX) 400 MG tablet Take 400 mg by mouth daily.      Marland Kitchen OVER THE COUNTER MEDICATION Take 2 tablets by mouth daily. IMMUNE STIMULATOR      . OVER THE COUNTER MEDICATION Take 2 tablets by mouth daily. PROBIOTIC 11      . OVER THE COUNTER MEDICATION Take 1 Can by mouth every Monday, Wednesday, and Friday. WHEY PROTEIN DRINKS      . OVER THE COUNTER MEDICATION Take 15 mLs by mouth daily with lunch. Liquid Chlorophyll      . OVER THE COUNTER MEDICATION Take 2 tablets by mouth 2 (two) times daily. Cordycepts      . OVER THE COUNTER MEDICATION Take 2 tablets by mouth 2 (two) times daily. Skeletal Strength      . OVER THE COUNTER MEDICATION Take 1 tablet by mouth 2 (two) times daily. Stress relief      . Potassium 99 MG TABS Take 1 tablet  by mouth 2 (two) times daily.      Marland Kitchen UNABLE TO FIND Take 1 tablet by mouth 4 (four) times daily. Protease Plus      . ciprofloxacin (CIPRO) 250 MG tablet Take 1 tablet (250 mg total) by mouth 2 (two) times daily.  14 tablet  0  . traMADol (ULTRAM) 50 MG tablet Take 1 tablet (50 mg total) by mouth every 6 (six) hours as needed for moderate pain.  60 tablet  0   No current facility-administered medications for this visit.   Facility-Administered Medications Ordered in Other Visits  Medication Dose Route Frequency Provider Last Rate Last Dose  . LORazepam (ATIVAN) tablet 0.5 mg  0.5 mg Oral UD Lennis  Buzzy Han, MD   0.5 mg at 01/27/12 1011    REVIEW OF SYSTEMS:   Constitutional: Denies fevers, chills or abnormal weight loss Eyes: Denies blurriness of vision Ears, nose, mouth, throat, and face: Denies mucositis or sore throat Cardiovascular: Denies palpitation, chest discomfort or lower extremity swelling Lymphatics: Denies new lymphadenopathy or easy bruising Neurological:Denies numbness, tingling or new weaknesses Behavioral/Psych: Mood is stable, no new changes  All other systems were reviewed with the patient and are negative.  PHYSICAL EXAMINATION: ECOG PERFORMANCE STATUS: 1 - Symptomatic but completely ambulatory  Filed Vitals:   10/04/13 0902  BP: 127/65  Pulse:   Temp:   Resp:    Filed Weights   10/04/13 0853  Weight: 236 lb 3.2 oz (107.14 kg)    GENERAL:alert, no distress and comfortable. Patient is morbidly obese SKIN: She had significant new bruises since the last time I saw her EYES: normal, Conjunctiva are pink and non-injected, sclera clear OROPHARYNX:no exudate, no erythema and lips, buccal mucosa, and tongue normal  NECK: supple, thyroid normal size, non-tender, without nodularity LYMPH:  no palpable lymphadenopathy in the cervical, axillary or inguinal LUNGS: clear to auscultation and percussion with normal breathing effort HEART: regular rate & rhythm and no murmurs and no lower extremity edema ABDOMEN:abdomen soft, non-tender and normal bowel sounds Musculoskeletal:no cyanosis of digits and no clubbing  NEURO: alert & oriented x 3 with fluent speech, no focal motor/sensory deficits  LABORATORY DATA:  I have reviewed the data as listed    Component Value Date/Time   NA 143 10/04/2013 0839   NA 141 05/19/2012 1036   K 3.8 10/04/2013 0839   K 4.3 05/19/2012 1036   CL 98 02/02/2013 1353   CL 102 05/19/2012 1036   CO2 30* 10/04/2013 0839   CO2 30 05/19/2012 1036   GLUCOSE 91 10/04/2013 0839   GLUCOSE 99 02/02/2013 1353   GLUCOSE 85 05/19/2012 1036   BUN  21.3 10/04/2013 0839   BUN 21 05/19/2012 1036   CREATININE 0.9 10/04/2013 0839   CREATININE 0.96 05/19/2012 1036   CALCIUM 9.4 10/04/2013 0839   CALCIUM 9.2 05/19/2012 1036   PROT 6.2* 10/04/2013 0839   PROT 5.7* 05/19/2012 1036   ALBUMIN 4.0 10/04/2013 0839   ALBUMIN 4.2 05/19/2012 1036   AST 17 10/04/2013 0839   AST 20 05/19/2012 1036   ALT 9 10/04/2013 0839   ALT 22 05/19/2012 1036   ALKPHOS 65 10/04/2013 0839   ALKPHOS 106 05/19/2012 1036   BILITOT 0.63 10/04/2013 0839   BILITOT 0.5 05/19/2012 1036   GFRNONAA 56* 08/31/2010 1429   GFRAA  Value: >60        The eGFR has been calculated using the MDRD equation. This calculation has not been validated in all clinical situations.  eGFR's persistently <60 mL/min signify possible Chronic Kidney Disease. 08/31/2010 1429    No results found for this basename: SPEP,  UPEP,   kappa and lambda light chains    Lab Results  Component Value Date   WBC 214.9* 10/04/2013   NEUTROABS 14.3* 10/04/2013   HGB 10.6* 10/04/2013   HCT 32.4* 10/04/2013   MCV 88.5 10/04/2013   PLT 120* 10/04/2013      Chemistry      Component Value Date/Time   NA 143 10/04/2013 0839   NA 141 05/19/2012 1036   K 3.8 10/04/2013 0839   K 4.3 05/19/2012 1036   CL 98 02/02/2013 1353   CL 102 05/19/2012 1036   CO2 30* 10/04/2013 0839   CO2 30 05/19/2012 1036   BUN 21.3 10/04/2013 0839   BUN 21 05/19/2012 1036   CREATININE 0.9 10/04/2013 0839   CREATININE 0.96 05/19/2012 1036      Component Value Date/Time   CALCIUM 9.4 10/04/2013 0839   CALCIUM 9.2 05/19/2012 1036   ALKPHOS 65 10/04/2013 0839   ALKPHOS 106 05/19/2012 1036   AST 17 10/04/2013 0839   AST 20 05/19/2012 1036   ALT 9 10/04/2013 0839   ALT 22 05/19/2012 1036   BILITOT 0.63 10/04/2013 0839   BILITOT 0.5 05/19/2012 1036     ASSESSMENT & PLAN:  #1 CLL She has significant good response to treatment. I recommend we continue without dose adjustment. I will repeat imaging study end of next month. Clinically she is responding  well. #2 anemia This is likely due to recent treatment. The patient denies recent history of bleeding such as epistaxis, hematuria or hematochezia. She is asymptomatic from the anemia. I will observe for now.  She does not require transfusion now. I will continue the chemotherapy at current dose without dosage adjustment.  If the anemia gets progressive worse in the future, I might have to delay her treatment or adjust the chemotherapy dose. #3 sore throat nasal congestion I suspect she may have infection. Technically the patient is neutropenic. I will go ahead and give her a prescription of ciprofloxacin to take for one week. Side effects to be expected from antibiotics I discussed with the patient including risk of allergic reaction and diarrhea #4 thrombocytopenia This is likely due to her underlying disease. The patient denies recent history of bleeding such as epistaxis, hematuria or hematochezia. She is asymptomatic from the low platelet count. I will observe for now.  she does not require transfusion now. I will continue the chemotherapy at current dose without dosage adjustment.  If the thrombocytopenia gets progressive worse in the future, I might have to delay her treatment or adjust the chemotherapy dose. #5 extreme leukocytosis This is expected side effects from her chemotherapy. We will observe for now. It is already improving. #6 new bruises Suspect this is due to her nutritional supplements. I recommend she stop taking them Orders Placed This Encounter  Procedures  . CBC & Diff and Retic    Standing Status: Standing     Number of Occurrences: 9     Standing Expiration Date: 10/04/2014  . Comprehensive metabolic panel    Standing Status: Standing     Number of Occurrences: 9     Standing Expiration Date: 10/04/2014   All questions were answered. The patient knows to call the clinic with any problems, questions or concerns. No barriers to learning was detected.    Markcus Lazenby,  MD 10/04/2013 11:09 AM

## 2013-10-19 ENCOUNTER — Telehealth: Payer: Self-pay | Admitting: Hematology and Oncology

## 2013-10-19 ENCOUNTER — Ambulatory Visit (HOSPITAL_BASED_OUTPATIENT_CLINIC_OR_DEPARTMENT_OTHER): Payer: Medicare Other | Admitting: Hematology and Oncology

## 2013-10-19 ENCOUNTER — Other Ambulatory Visit (HOSPITAL_BASED_OUTPATIENT_CLINIC_OR_DEPARTMENT_OTHER): Payer: Medicare Other

## 2013-10-19 VITALS — BP 152/78 | HR 66 | Temp 98.1°F | Resp 20 | Ht 63.0 in | Wt 239.5 lb

## 2013-10-19 DIAGNOSIS — C9190 Lymphoid leukemia, unspecified not having achieved remission: Secondary | ICD-10-CM

## 2013-10-19 DIAGNOSIS — C9110 Chronic lymphocytic leukemia of B-cell type not having achieved remission: Secondary | ICD-10-CM

## 2013-10-19 DIAGNOSIS — J029 Acute pharyngitis, unspecified: Secondary | ICD-10-CM

## 2013-10-19 DIAGNOSIS — D696 Thrombocytopenia, unspecified: Secondary | ICD-10-CM

## 2013-10-19 DIAGNOSIS — D649 Anemia, unspecified: Secondary | ICD-10-CM

## 2013-10-19 DIAGNOSIS — D72829 Elevated white blood cell count, unspecified: Secondary | ICD-10-CM

## 2013-10-19 LAB — CBC & DIFF AND RETIC
BASO%: 0.1 % (ref 0.0–2.0)
Basophils Absolute: 0.2 10*3/uL — ABNORMAL HIGH (ref 0.0–0.1)
EOS%: 0.3 % (ref 0.0–7.0)
Eosinophils Absolute: 0.4 10*3/uL (ref 0.0–0.5)
HCT: 34.2 % — ABNORMAL LOW (ref 34.8–46.6)
HGB: 11.1 g/dL — ABNORMAL LOW (ref 11.6–15.9)
Immature Retic Fract: 16.6 % — ABNORMAL HIGH (ref 1.60–10.00)
LYMPH%: 92.3 % — ABNORMAL HIGH (ref 14.0–49.7)
MCH: 28.5 pg (ref 25.1–34.0)
MCHC: 32.4 g/dL (ref 31.5–36.0)
MCV: 88.1 fL (ref 79.5–101.0)
MONO#: 2.3 10*3/uL — ABNORMAL HIGH (ref 0.1–0.9)
MONO%: 1.3 % (ref 0.0–14.0)
NEUT#: 10.6 10*3/uL — ABNORMAL HIGH (ref 1.5–6.5)
NEUT%: 6 % — ABNORMAL LOW (ref 38.4–76.8)
Platelets: 127 10*3/uL — ABNORMAL LOW (ref 145–400)
RBC: 3.88 10*6/uL (ref 3.70–5.45)
RDW: 15.5 % — ABNORMAL HIGH (ref 11.2–14.5)
Retic %: 1.63 % (ref 0.70–2.10)
Retic Ct Abs: 63.24 10*3/uL (ref 33.70–90.70)
WBC: 176.1 10*3/uL (ref 3.9–10.3)
lymph#: 162.6 10*3/uL — ABNORMAL HIGH (ref 0.9–3.3)

## 2013-10-19 LAB — COMPREHENSIVE METABOLIC PANEL (CC13)
ALT: 14 U/L (ref 0–55)
AST: 14 U/L (ref 5–34)
Albumin: 4 g/dL (ref 3.5–5.0)
Alkaline Phosphatase: 68 U/L (ref 40–150)
Anion Gap: 11 mEq/L (ref 3–11)
BUN: 21.6 mg/dL (ref 7.0–26.0)
CO2: 29 mEq/L (ref 22–29)
Calcium: 9.5 mg/dL (ref 8.4–10.4)
Chloride: 102 mEq/L (ref 98–109)
Creatinine: 1 mg/dL (ref 0.6–1.1)
Glucose: 92 mg/dl (ref 70–140)
Potassium: 3.8 mEq/L (ref 3.5–5.1)
Sodium: 142 mEq/L (ref 136–145)
Total Bilirubin: 0.42 mg/dL (ref 0.20–1.20)
Total Protein: 6.3 g/dL — ABNORMAL LOW (ref 6.4–8.3)

## 2013-10-19 LAB — TECHNOLOGIST REVIEW

## 2013-10-19 NOTE — Telephone Encounter (Signed)
gv and pritned appt sched and avs for pt for Jan...gv pt barium

## 2013-10-19 NOTE — Telephone Encounter (Signed)
gv adn printed aptp sched and avs for pt for Jan 2015...gv pt barium

## 2013-10-19 NOTE — Progress Notes (Signed)
[place] OFFICE PROGRESS NOTE  Patient Care Team: Lilian Coma, MD as PCP - General (Family Medicine)  DIAGNOSIS: CLL, on palliative chemotherapy  SUMMARY OF ONCOLOGIC HISTORY:   CLL (chronic lymphoid leukemia) with failed remission   07/02/2010 Procedure LN biopsy confirmed CLL   08/18/2010 Procedure Bone marrow biopsy confirmed CLL   11/18/2010 - 04/15/2011 Chemotherapy patient received 6 cycles of FLudarabine and Rituximab   01/13/2012 Relapse/Recurrence Repeat BM biopsy confirmed relapse   01/27/2012 - 06/23/2012 Chemotherapy Bendamustine & Rituximab given, complicated by infusion reaction to Rituximab, completed 6 cycles   06/29/2013 Imaging Ct scan confirmed disease relapse with bulky lymphadenopathy   07/23/2013 Procedure Repeat BM biopsy due to thrombocytopenia and progressive luekocytosis   08/02/2013 Relapse/Recurrence Patient consented to start iburitinib as salvage therapy for relapsed CLL    INTERVAL HISTORY: Erin Moore 68 y.o. female returns for further followup. Her recent sore throat has resolved. A bowel habits are now regular with no further diarrhea. Her bruising is improving. She denies any recent fever, chills, night sweats or abnormal weight loss The patient denies any recent signs or symptoms of bleeding such as spontaneous epistaxis, hematuria or hematochezia.   I have reviewed the past medical history, past surgical history, social history and family history with the patient and they are unchanged from previous note.  ALLERGIES:  is allergic to azithromycin and ibuprofen.  MEDICATIONS:  Current Outpatient Prescriptions  Medication Sig Dispense Refill  . acyclovir (ZOVIRAX) 400 MG tablet Take 400 mg by mouth daily.      Marland Kitchen allopurinol (ZYLOPRIM) 300 MG tablet Take 300 mg by mouth every morning.      . Carboxymethylcellul-Glycerin (REFRESH OPTIVE SENSITIVE OP) Place 1 drop into both eyes 2 (two) times daily.       . Cholecalciferol 2000  UNITS CAPS Take 1 capsule by mouth 3 (three) times daily.      . citalopram (CELEXA) 20 MG tablet Take 20 mg by mouth every morning.       . Coenzyme Q10 50 MG CAPS Take 1 capsule by mouth every evening.      . ibrutinib (IMBRUVICA) 140 MG capsul Take 3 capsules (420 mg total) by mouth daily.  90 capsule  3  . LECITHIN PO Take 4 tablets by mouth daily.       Marland Kitchen losartan-hydrochlorothiazide (HYZAAR) 100-12.5 MG per tablet Take 1 tablet by mouth every morning.       . magnesium oxide (MAG-OX) 400 MG tablet Take 400 mg by mouth daily.      Marland Kitchen OVER THE COUNTER MEDICATION Take 2 tablets by mouth daily. IMMUNE STIMULATOR      . OVER THE COUNTER MEDICATION Take 2 tablets by mouth daily. PROBIOTIC 11      . OVER THE COUNTER MEDICATION Take 1 Can by mouth every Monday, Wednesday, and Friday. WHEY PROTEIN DRINKS      . OVER THE COUNTER MEDICATION Take 15 mLs by mouth daily with lunch. Liquid Chlorophyll      . OVER THE COUNTER MEDICATION Take 2 tablets by mouth 2 (two) times daily. Cordycepts      . OVER THE COUNTER MEDICATION Take 2 tablets by mouth 2 (two) times daily. Skeletal Strength      . OVER THE COUNTER MEDICATION Take 1 tablet by mouth 2 (two) times daily. Stress relief      . Potassium 99 MG TABS Take 1 tablet by mouth 2 (two) times daily.      Marland Kitchen  UNABLE TO FIND Take 1 tablet by mouth 4 (four) times daily. Protease Plus       No current facility-administered medications for this visit.   Facility-Administered Medications Ordered in Other Visits  Medication Dose Route Frequency Provider Last Rate Last Dose  . LORazepam (ATIVAN) tablet 0.5 mg  0.5 mg Oral UD Lennis Marion Downer, MD   0.5 mg at 01/27/12 1011    REVIEW OF SYSTEMS:   Eyes: Denies blurriness of vision Respiratory: Denies cough, dyspnea or wheezes Cardiovascular: Denies palpitation, chest discomfort or lower extremity swelling Gastrointestinal:  Denies nausea, heartburn or change in bowel habits Neurological:Denies numbness,  tingling or new weaknesses Behavioral/Psych: Mood is stable, no new changes  All other systems were reviewed with the patient and are negative.  PHYSICAL EXAMINATION: ECOG PERFORMANCE STATUS: 0 - Asymptomatic  Filed Vitals:   10/19/13 0857  BP: 152/78  Pulse: 66  Temp:   Resp:    Filed Weights   10/19/13 0850  Weight: 239 lb 8 oz (108.636 kg)    GENERAL:alert, no distress and comfortable. Patient is morbidly obese  SKIN: skin color normal, prior bruises are resolving  EYES: normal, Conjunctiva are pink and non-injected, sclera clear OROPHARYNX:no exudate, no erythema and lips, buccal mucosa, and tongue normal  NECK: supple, thyroid normal size, non-tender, without nodularity LYMPH:  no palpable lymphadenopathy in the cervical, axillary or inguinal LUNGS: clear to auscultation and percussion with normal breathing effort HEART: regular rate & rhythm and no murmurs and no lower extremity edema ABDOMEN:abdomen soft, non-tender and normal bowel sounds Musculoskeletal:no cyanosis of digits and no clubbing  NEURO: alert & oriented x 3 with fluent speech, no focal motor/sensory deficits  LABORATORY DATA:  I have reviewed the data as listed    Component Value Date/Time   NA 143 10/04/2013 0839   NA 141 05/19/2012 1036   K 3.8 10/04/2013 0839   K 4.3 05/19/2012 1036   CL 98 02/02/2013 1353   CL 102 05/19/2012 1036   CO2 30* 10/04/2013 0839   CO2 30 05/19/2012 1036   GLUCOSE 91 10/04/2013 0839   GLUCOSE 99 02/02/2013 1353   GLUCOSE 85 05/19/2012 1036   BUN 21.3 10/04/2013 0839   BUN 21 05/19/2012 1036   CREATININE 0.9 10/04/2013 0839   CREATININE 0.96 05/19/2012 1036   CALCIUM 9.4 10/04/2013 0839   CALCIUM 9.2 05/19/2012 1036   PROT 6.2* 10/04/2013 0839   PROT 5.7* 05/19/2012 1036   ALBUMIN 4.0 10/04/2013 0839   ALBUMIN 4.2 05/19/2012 1036   AST 17 10/04/2013 0839   AST 20 05/19/2012 1036   ALT 9 10/04/2013 0839   ALT 22 05/19/2012 1036   ALKPHOS 65 10/04/2013 0839   ALKPHOS 106 05/19/2012 1036    BILITOT 0.63 10/04/2013 0839   BILITOT 0.5 05/19/2012 1036   GFRNONAA 56* 08/31/2010 1429   GFRAA  Value: >60        The eGFR has been calculated using the MDRD equation. This calculation has not been validated in all clinical situations. eGFR's persistently <60 mL/min signify possible Chronic Kidney Disease. 08/31/2010 1429    No results found for this basename: SPEP,  UPEP,   kappa and lambda light chains    Lab Results  Component Value Date   WBC 176.1* 10/19/2013   NEUTROABS 10.6* 10/19/2013   HGB 11.1* 10/19/2013   HCT 34.2* 10/19/2013   MCV 88.1 10/19/2013   PLT 127* 10/19/2013      Chemistry      Component  Value Date/Time   NA 143 10/04/2013 0839   NA 141 05/19/2012 1036   K 3.8 10/04/2013 0839   K 4.3 05/19/2012 1036   CL 98 02/02/2013 1353   CL 102 05/19/2012 1036   CO2 30* 10/04/2013 0839   CO2 30 05/19/2012 1036   BUN 21.3 10/04/2013 0839   BUN 21 05/19/2012 1036   CREATININE 0.9 10/04/2013 0839   CREATININE 0.96 05/19/2012 1036      Component Value Date/Time   CALCIUM 9.4 10/04/2013 0839   CALCIUM 9.2 05/19/2012 1036   ALKPHOS 65 10/04/2013 0839   ALKPHOS 106 05/19/2012 1036   AST 17 10/04/2013 0839   AST 20 05/19/2012 1036   ALT 9 10/04/2013 0839   ALT 22 05/19/2012 1036   BILITOT 0.63 10/04/2013 0839   BILITOT 0.5 05/19/2012 1036      ASSESSMENT & PLAN:  #1 CLL She has significant good response to treatment. I recommend we continue without dose adjustment. I will repeat imaging study end of this month. Clinically she is responding well. #2 anemia This is likely due to recent treatment. The patient denies recent history of bleeding such as epistaxis, hematuria or hematochezia. She is asymptomatic from the anemia. I will observe for now.  She does not require transfusion now. I will continue the chemotherapy at current dose without dosage adjustment.  If the anemia gets progressive worse in the future, I might have to delay her treatment or adjust the chemotherapy dose. #3  thrombocytopenia This is likely due to her underlying disease. The patient denies recent history of bleeding such as epistaxis, hematuria or hematochezia. She is asymptomatic from the low platelet count. I will observe for now.  she does not require transfusion now. I will continue the chemotherapy at current dose without dosage adjustment.  If the thrombocytopenia gets progressive worse in the future, I might have to delay her treatment or adjust the chemotherapy dose. #4 extreme leukocytosis This is expected side effects from her chemotherapy. We will observe for now. It is already improving.  Orders Placed This Encounter  Procedures  . CT Abdomen Pelvis W Contrast    Standing Status: Future     Number of Occurrences:      Standing Expiration Date: 01/19/2015    Order Specific Question:  Reason for Exam (SYMPTOM  OR DIAGNOSIS REQUIRED)    Answer:  staging CLL, assess response to Rx    Order Specific Question:  Preferred imaging location?    Answer:  [facility]  . CT Chest W Contrast    Standing Status: Future     Number of Occurrences:      Standing Expiration Date: 12/19/2014    Order Specific Question:  Reason for Exam (SYMPTOM  OR DIAGNOSIS REQUIRED)    Answer:  staging CLL, assess response to Rx    Order Specific Question:  Preferred imaging location?    Answer:  [facility]   All questions were answered. The patient knows to call the clinic with any problems, questions or concerns. No barriers to learning was detected. I spent 25 minutes counseling the patient face to face. The total time spent in the appointment was 40 minutes and more than 50% was on counseling and review of test results     [facility], [place], MD 10/19/2013 9:10 AM

## 2013-10-25 ENCOUNTER — Encounter: Payer: Self-pay | Admitting: *Deleted

## 2013-10-25 NOTE — Progress Notes (Signed)
RECEIVED A FAX FROM [facility] CONCERNING A CONFIRMATION OF PRESCRIPTION SHIPMENT FOR [place] ON 10/24/13.

## 2013-11-09 ENCOUNTER — Ambulatory Visit (HOSPITAL_COMMUNITY)
Admission: RE | Admit: 2013-11-09 | Discharge: 2013-11-09 | Disposition: A | Discharge status: Home / Self Care | Payer: Medicare Other | Source: Ambulatory Visit | Attending: Hematology and Oncology | Admitting: Hematology and Oncology

## 2013-11-09 ENCOUNTER — Encounter (HOSPITAL_COMMUNITY): Payer: Self-pay

## 2013-11-09 DIAGNOSIS — R599 Enlarged lymph nodes, unspecified: Secondary | ICD-10-CM | POA: Insufficient documentation

## 2013-11-09 DIAGNOSIS — R161 Splenomegaly, not elsewhere classified: Secondary | ICD-10-CM | POA: Insufficient documentation

## 2013-11-09 DIAGNOSIS — C9110 Chronic lymphocytic leukemia of B-cell type not having achieved remission: Secondary | ICD-10-CM

## 2013-11-09 DIAGNOSIS — C9190 Lymphoid leukemia, unspecified not having achieved remission: Secondary | ICD-10-CM

## 2013-11-09 DIAGNOSIS — Z9089 Acquired absence of other organs: Secondary | ICD-10-CM | POA: Insufficient documentation

## 2013-11-09 MED ORDER — IOHEXOL 300 MG/ML  SOLN
100.0000 mL | Freq: Once | INTRAMUSCULAR | Status: AC | PRN
  Administered 2013-11-09: 100 mL via INTRAVENOUS

## 2013-11-15 ENCOUNTER — Other Ambulatory Visit (HOSPITAL_BASED_OUTPATIENT_CLINIC_OR_DEPARTMENT_OTHER): Payer: Medicare Other

## 2013-11-15 ENCOUNTER — Ambulatory Visit (HOSPITAL_BASED_OUTPATIENT_CLINIC_OR_DEPARTMENT_OTHER): Payer: Medicare Other | Admitting: Hematology and Oncology

## 2013-11-15 ENCOUNTER — Telehealth: Payer: Self-pay | Admitting: *Deleted

## 2013-11-15 ENCOUNTER — Other Ambulatory Visit: Payer: Self-pay | Admitting: *Deleted

## 2013-11-15 VITALS — BP 138/75 | HR 72 | Temp 98.0°F | Resp 20 | Ht 63.0 in | Wt 236.6 lb

## 2013-11-15 DIAGNOSIS — D72829 Elevated white blood cell count, unspecified: Secondary | ICD-10-CM

## 2013-11-15 DIAGNOSIS — I10 Essential (primary) hypertension: Secondary | ICD-10-CM

## 2013-11-15 DIAGNOSIS — J029 Acute pharyngitis, unspecified: Secondary | ICD-10-CM

## 2013-11-15 DIAGNOSIS — R5383 Other fatigue: Secondary | ICD-10-CM

## 2013-11-15 DIAGNOSIS — D696 Thrombocytopenia, unspecified: Secondary | ICD-10-CM

## 2013-11-15 DIAGNOSIS — C9110 Chronic lymphocytic leukemia of B-cell type not having achieved remission: Secondary | ICD-10-CM

## 2013-11-15 DIAGNOSIS — C9190 Lymphoid leukemia, unspecified not having achieved remission: Secondary | ICD-10-CM

## 2013-11-15 LAB — CBC & DIFF AND RETIC
BASO%: 0.2 % (ref 0.0–2.0)
Basophils Absolute: 0.2 10*3/uL — ABNORMAL HIGH (ref 0.0–0.1)
EOS%: 0.2 % (ref 0.0–7.0)
Eosinophils Absolute: 0.2 10*3/uL (ref 0.0–0.5)
HCT: 39.2 % (ref 34.8–46.6)
HGB: 12.2 g/dL (ref 11.6–15.9)
Immature Retic Fract: 11.6 % — ABNORMAL HIGH (ref 1.60–10.00)
LYMPH%: 92.9 % — ABNORMAL HIGH (ref 14.0–49.7)
MCH: 27.5 pg (ref 25.1–34.0)
MCHC: 31.1 g/dL — ABNORMAL LOW (ref 31.5–36.0)
MCV: 88.3 fL (ref 79.5–101.0)
MONO#: 1.4 10*3/uL — ABNORMAL HIGH (ref 0.1–0.9)
MONO%: 1.5 % (ref 0.0–14.0)
NEUT#: 4.8 10*3/uL (ref 1.5–6.5)
NEUT%: 5.2 % — ABNORMAL LOW (ref 38.4–76.8)
Platelets: 125 10*3/uL — ABNORMAL LOW (ref 145–400)
RBC: 4.44 10*6/uL (ref 3.70–5.45)
RDW: 14.7 % — ABNORMAL HIGH (ref 11.2–14.5)
Retic %: 1.21 % (ref 0.70–2.10)
Retic Ct Abs: 53.72 10*3/uL (ref 33.70–90.70)
WBC: 91.9 10*3/uL (ref 3.9–10.3)
lymph#: 85.4 10*3/uL — ABNORMAL HIGH (ref 0.9–3.3)

## 2013-11-15 LAB — COMPREHENSIVE METABOLIC PANEL (CC13)
ALT: 11 U/L (ref 0–55)
AST: 12 U/L (ref 5–34)
Albumin: 4.3 g/dL (ref 3.5–5.0)
Alkaline Phosphatase: 70 U/L (ref 40–150)
Anion Gap: 10 mEq/L (ref 3–11)
BUN: 21.3 mg/dL (ref 7.0–26.0)
CO2: 31 mEq/L — ABNORMAL HIGH (ref 22–29)
Calcium: 10.1 mg/dL (ref 8.4–10.4)
Chloride: 101 mEq/L (ref 98–109)
Creatinine: 1.1 mg/dL (ref 0.6–1.1)
Glucose: 90 mg/dl (ref 70–140)
Potassium: 4.2 mEq/L (ref 3.5–5.1)
Sodium: 142 mEq/L (ref 136–145)
Total Bilirubin: 0.51 mg/dL (ref 0.20–1.20)
Total Protein: 6.4 g/dL (ref 6.4–8.3)

## 2013-11-15 MED ORDER — AMLODIPINE BESYLATE 10 MG PO TABS: 10.0000 mg | ORAL_TABLET | Freq: Every day | ORAL | Status: DC

## 2013-11-15 MED ORDER — IBRUTINIB 140 MG PO CAPS: 420.0000 mg | ORAL_CAPSULE | Freq: Every day | ORAL | Status: DC

## 2013-11-15 NOTE — Telephone Encounter (Signed)
FAX SCRIPT FOR IBRUTINIB ([place]) TO [facility] [phone].

## 2013-11-15 NOTE — Progress Notes (Signed)
[place] OFFICE PROGRESS NOTE  Patient Care Team: Lilian Coma, MD as PCP - General (Family Medicine)  DIAGNOSIS: CLL, ongoing palliative chemotherapy  SUMMARY OF ONCOLOGIC HISTORY:   CLL (chronic lymphoid leukemia) with failed remission   07/02/2010 Procedure LN biopsy confirmed CLL   08/18/2010 Procedure Bone marrow biopsy confirmed CLL   11/18/2010 - 04/15/2011 Chemotherapy patient received 6 cycles of FLudarabine and Rituximab   01/13/2012 Relapse/Recurrence Repeat BM biopsy confirmed relapse   01/27/2012 - 06/23/2012 Chemotherapy Bendamustine & Rituximab given, complicated by infusion reaction to Rituximab, completed 6 cycles   06/29/2013 Imaging Ct scan confirmed disease relapse with bulky lymphadenopathy   07/23/2013 Procedure Repeat BM biopsy due to thrombocytopenia and progressive luekocytosis   08/02/2013 Relapse/Recurrence Patient consented to start iburitinib as salvage therapy for relapsed CLL   11/09/2013 Imaging Repeat CT scan the chest, abdomen and pelvis showed greater than 50% response to treatment    INTERVAL HISTORY: Erin Moore 68 y.o. female returns for further followup. She still has easy bruising. The patient denies any recent signs or symptoms of bleeding such as spontaneous epistaxis, hematuria or hematochezia. She denies any recent fever, chills, night sweats or abnormal weight loss Denies any recent infections  I have reviewed the past medical history, past surgical history, social history and family history with the patient and they are unchanged from previous note.  ALLERGIES:  is allergic to azithromycin and ibuprofen.  MEDICATIONS:  Current Outpatient Prescriptions  Medication Sig Dispense Refill  . acyclovir (ZOVIRAX) 400 MG tablet Take 400 mg by mouth daily.      Marland Kitchen allopurinol (ZYLOPRIM) 300 MG tablet Take 300 mg by mouth every morning.      . Carboxymethylcellul-Glycerin (REFRESH OPTIVE SENSITIVE OP) Place 1 drop into both eyes 2  (two) times daily.       . Cholecalciferol 2000 UNITS CAPS Take 1 capsule by mouth 3 (three) times daily.      . citalopram (CELEXA) 20 MG tablet Take 20 mg by mouth every morning.       . Coenzyme Q10 50 MG CAPS Take 1 capsule by mouth every evening.      . ibrutinib (IMBRUVICA) 140 MG capsul Take 3 capsules (420 mg total) by mouth daily.  90 capsule  3  . LECITHIN PO Take 4 tablets by mouth daily.       Marland Kitchen losartan-hydrochlorothiazide (HYZAAR) 100-12.5 MG per tablet Take 1 tablet by mouth every morning.       . magnesium oxide (MAG-OX) 400 MG tablet Take 400 mg by mouth daily.      Marland Kitchen OVER THE COUNTER MEDICATION Take 2 tablets by mouth daily. IMMUNE STIMULATOR      . OVER THE COUNTER MEDICATION Take 2 tablets by mouth daily. PROBIOTIC 11      . OVER THE COUNTER MEDICATION Take 1 Can by mouth every Monday, Wednesday, and Friday. WHEY PROTEIN DRINKS      . OVER THE COUNTER MEDICATION Take 15 mLs by mouth daily with lunch. Liquid Chlorophyll      . OVER THE COUNTER MEDICATION Take 2 tablets by mouth 2 (two) times daily. Cordycepts      . OVER THE COUNTER MEDICATION Take 2 tablets by mouth 2 (two) times daily. Skeletal Strength      . OVER THE COUNTER MEDICATION Take 1 tablet by mouth 2 (two) times daily. Stress relief      . Potassium 99 MG TABS Take 1 tablet by mouth 2 (two)  times daily.      Marland Kitchen UNABLE TO FIND Take 1 tablet by mouth 4 (four) times daily. Protease Plus      . amLODipine (NORVASC) 10 MG tablet Take 1 tablet (10 mg total) by mouth daily.  90 tablet  1   No current facility-administered medications for this visit.   Facility-Administered Medications Ordered in Other Visits  Medication Dose Route Frequency Provider Last Rate Last Dose  . LORazepam (ATIVAN) tablet 0.5 mg  0.5 mg Oral UD Lennis Marion Downer, MD   0.5 mg at 01/27/12 1011    REVIEW OF SYSTEMS:   Constitutional: Denies fevers, chills or abnormal weight loss Eyes: Denies blurriness of vision Ears, nose, mouth, throat,  and face: Denies mucositis or sore throat Respiratory: Denies cough, dyspnea or wheezes Cardiovascular: Denies palpitation, chest discomfort or lower extremity swelling Gastrointestinal:  Denies nausea, heartburn or change in bowel habits Lymphatics: Denies new lymphadenopathy Neurological:Denies numbness, tingling or new weaknesses Behavioral/Psych: Mood is stable, no new changes  All other systems were reviewed with the patient and are negative.  PHYSICAL EXAMINATION: ECOG PERFORMANCE STATUS: 1 - Symptomatic but completely ambulatory  Filed Vitals:   11/15/13 0901  BP: 138/75  Pulse: 72  Temp:   Resp:    Filed Weights   11/15/13 0855  Weight: 236 lb 9.6 oz (107.321 kg)    GENERAL:alert, no distress and comfortable SKIN: Noticed some bruises on the skin but no petechiae EYES: normal, Conjunctiva are pink and non-injected, sclera clear OROPHARYNX:no exudate, no erythema and lips, buccal mucosa, and tongue normal  NECK: supple, thyroid normal size, non-tender, without nodularity LYMPH:  no palpable lymphadenopathy in the cervical, axillary or inguinal LUNGS: clear to auscultation and percussion with normal breathing effort HEART: regular rate & rhythm and no murmurs and no lower extremity edema ABDOMEN:abdomen soft, non-tender and normal bowel sounds Musculoskeletal:no cyanosis of digits and no clubbing  NEURO: alert & oriented x 3 with fluent speech, no focal motor/sensory deficits  LABORATORY DATA:  I have reviewed the data as listed    Component Value Date/Time   NA 142 11/15/2013 0841   NA 141 05/19/2012 1036   K 4.2 11/15/2013 0841   K 4.3 05/19/2012 1036   CL 98 02/02/2013 1353   CL 102 05/19/2012 1036   CO2 31* 11/15/2013 0841   CO2 30 05/19/2012 1036   GLUCOSE 90 11/15/2013 0841   GLUCOSE 99 02/02/2013 1353   GLUCOSE 85 05/19/2012 1036   BUN 21.3 11/15/2013 0841   BUN 21 05/19/2012 1036   CREATININE 1.1 11/15/2013 0841   CREATININE 0.96 05/19/2012 1036   CALCIUM 10.1 11/15/2013  0841   CALCIUM 9.2 05/19/2012 1036   PROT 6.4 11/15/2013 0841   PROT 5.7* 05/19/2012 1036   ALBUMIN 4.3 11/15/2013 0841   ALBUMIN 4.2 05/19/2012 1036   AST 12 11/15/2013 0841   AST 20 05/19/2012 1036   ALT 11 11/15/2013 0841   ALT 22 05/19/2012 1036   ALKPHOS 70 11/15/2013 0841   ALKPHOS 106 05/19/2012 1036   BILITOT 0.51 11/15/2013 0841   BILITOT 0.5 05/19/2012 1036   GFRNONAA 56* 08/31/2010 1429   GFRAA  Value: >60        The eGFR has been calculated using the MDRD equation. This calculation has not been validated in all clinical situations. eGFR's persistently <60 mL/min signify possible Chronic Kidney Disease. 08/31/2010 1429    No results found for this basename: SPEP,  UPEP,   kappa and lambda light chains  Lab Results  Component Value Date   WBC 91.9* 11/15/2013   NEUTROABS 4.8 11/15/2013   HGB 12.2 11/15/2013   HCT 39.2 11/15/2013   MCV 88.3 11/15/2013   PLT 125* 11/15/2013      Chemistry      Component Value Date/Time   NA 142 11/15/2013 0841   NA 141 05/19/2012 1036   K 4.2 11/15/2013 0841   K 4.3 05/19/2012 1036   CL 98 02/02/2013 1353   CL 102 05/19/2012 1036   CO2 31* 11/15/2013 0841   CO2 30 05/19/2012 1036   BUN 21.3 11/15/2013 0841   BUN 21 05/19/2012 1036   CREATININE 1.1 11/15/2013 0841   CREATININE 0.96 05/19/2012 1036      Component Value Date/Time   CALCIUM 10.1 11/15/2013 0841   CALCIUM 9.2 05/19/2012 1036   ALKPHOS 70 11/15/2013 0841   ALKPHOS 106 05/19/2012 1036   AST 12 11/15/2013 0841   AST 20 05/19/2012 1036   ALT 11 11/15/2013 0841   ALT 22 05/19/2012 1036   BILITOT 0.51 11/15/2013 0841   BILITOT 0.5 05/19/2012 1036       RADIOGRAPHIC STUDIES: I reviewed the CT scan of the chest, abdomen and pelvis with the patient and family we show significant disease response I have personally reviewed the radiological images as listed and agreed with the findings in the report.  ASSESSMENT & PLAN:  #1 CLL She has significant good response to treatment. I recommend we continue without dose  adjustment. I will continue to follow on a monthly basis  #2 anemia, resolved #3 thrombocytopenia This is likely due to her underlying disease and splenomegaly. The patient denies recent history of bleeding such as epistaxis, hematuria or hematochezia. She is asymptomatic from the low platelet count. I will observe for now.  she does not require transfusion now. I will continue the chemotherapy at current dose without dosage adjustment.  If the thrombocytopenia gets progressive worse in the future, I might have to delay her treatment or adjust the chemotherapy dose. #4 extreme leukocytosis This is expected side effects from her chemotherapy. We will observe for now. It is already improving. #5 hypertension This is due to side effects of treatment. I will add amlodipine to be taken in the evening and will adjust her blood pressure medications in our next visit All questions were answered. The patient knows to call the clinic with any problems, questions or concerns. No barriers to learning was detected.    [place], [place], MD 11/15/2013 9:28 AM

## 2013-11-15 NOTE — Telephone Encounter (Signed)
appts made and printed...td 

## 2013-11-23 ENCOUNTER — Encounter: Payer: Self-pay | Admitting: *Deleted

## 2013-11-23 NOTE — Progress Notes (Signed)
RECEIVED A FAX FROM [facility] CONCERNING A CONFIRMATION OF PRESCRIPTION SHIPMENT FOR Erin Moore ON 11/22/13.

## 2013-12-13 ENCOUNTER — Ambulatory Visit: Payer: Medicare Other | Admitting: Hematology and Oncology

## 2013-12-13 ENCOUNTER — Telehealth: Payer: Self-pay | Admitting: Hematology and Oncology

## 2013-12-13 ENCOUNTER — Other Ambulatory Visit: Payer: Medicare Other

## 2013-12-13 NOTE — Telephone Encounter (Signed)
Pt called and r/s appt for 12/20/13 lab and MD

## 2013-12-18 ENCOUNTER — Ambulatory Visit (HOSPITAL_BASED_OUTPATIENT_CLINIC_OR_DEPARTMENT_OTHER): Payer: Medicare Other | Admitting: Hematology and Oncology

## 2013-12-18 ENCOUNTER — Telehealth: Payer: Self-pay | Admitting: Hematology and Oncology

## 2013-12-18 ENCOUNTER — Other Ambulatory Visit (HOSPITAL_BASED_OUTPATIENT_CLINIC_OR_DEPARTMENT_OTHER): Payer: Medicare Other

## 2013-12-18 ENCOUNTER — Encounter: Payer: Self-pay | Admitting: Hematology and Oncology

## 2013-12-18 VITALS — BP 136/72 | HR 72 | Temp 98.4°F | Resp 18 | Ht 63.0 in | Wt 239.3 lb

## 2013-12-18 DIAGNOSIS — C9110 Chronic lymphocytic leukemia of B-cell type not having achieved remission: Secondary | ICD-10-CM

## 2013-12-18 DIAGNOSIS — C9190 Lymphoid leukemia, unspecified not having achieved remission: Secondary | ICD-10-CM

## 2013-12-18 DIAGNOSIS — I10 Essential (primary) hypertension: Secondary | ICD-10-CM

## 2013-12-18 DIAGNOSIS — J029 Acute pharyngitis, unspecified: Secondary | ICD-10-CM

## 2013-12-18 DIAGNOSIS — D696 Thrombocytopenia, unspecified: Secondary | ICD-10-CM

## 2013-12-18 HISTORY — DX: Chronic lymphocytic leukemia of B-cell type not having achieved remission: C91.10

## 2013-12-18 LAB — CBC & DIFF AND RETIC
BASO%: 0.2 % (ref 0.0–2.0)
Basophils Absolute: 0.1 10*3/uL (ref 0.0–0.1)
EOS%: 0.3 % (ref 0.0–7.0)
Eosinophils Absolute: 0.1 10*3/uL (ref 0.0–0.5)
HCT: 38.4 % (ref 34.8–46.6)
HGB: 12.3 g/dL (ref 11.6–15.9)
Immature Retic Fract: 10 % (ref 1.60–10.00)
LYMPH%: 83.2 % — ABNORMAL HIGH (ref 14.0–49.7)
MCH: 27.9 pg (ref 25.1–34.0)
MCHC: 32 g/dL (ref 31.5–36.0)
MCV: 87.1 fL (ref 79.5–101.0)
MONO#: 0.7 10*3/uL (ref 0.1–0.9)
MONO%: 1.9 % (ref 0.0–14.0)
NEUT#: 5.5 10*3/uL (ref 1.5–6.5)
NEUT%: 14.4 % — ABNORMAL LOW (ref 38.4–76.8)
Platelets: 147 10*3/uL (ref 145–400)
RBC: 4.41 10*6/uL (ref 3.70–5.45)
RDW: 14.8 % — ABNORMAL HIGH (ref 11.2–14.5)
Retic %: 1.49 % (ref 0.70–2.10)
Retic Ct Abs: 65.71 10*3/uL (ref 33.70–90.70)
WBC: 37.8 10*3/uL — ABNORMAL HIGH (ref 3.9–10.3)
lymph#: 31.5 10*3/uL — ABNORMAL HIGH (ref 0.9–3.3)

## 2013-12-18 LAB — COMPREHENSIVE METABOLIC PANEL (CC13)
ALT: 10 U/L (ref 0–55)
AST: 14 U/L (ref 5–34)
Albumin: 4 g/dL (ref 3.5–5.0)
Alkaline Phosphatase: 65 U/L (ref 40–150)
Anion Gap: 9 mEq/L (ref 3–11)
BUN: 18.5 mg/dL (ref 7.0–26.0)
CO2: 32 mEq/L — ABNORMAL HIGH (ref 22–29)
Calcium: 10 mg/dL (ref 8.4–10.4)
Chloride: 102 mEq/L (ref 98–109)
Creatinine: 1 mg/dL (ref 0.6–1.1)
Glucose: 90 mg/dl (ref 70–140)
Potassium: 4 mEq/L (ref 3.5–5.1)
Sodium: 143 mEq/L (ref 136–145)
Total Bilirubin: 0.44 mg/dL (ref 0.20–1.20)
Total Protein: 6.2 g/dL — ABNORMAL LOW (ref 6.4–8.3)

## 2013-12-18 NOTE — Telephone Encounter (Signed)
Gave pt appt for lab and Md for march 2015

## 2013-12-18 NOTE — Progress Notes (Signed)
[place] OFFICE PROGRESS NOTE  Patient Care Team: Lilian Coma, MD as PCP - General (Family Medicine)  DIAGNOSIS: CLL, ongoing chemotherapy  SUMMARY OF ONCOLOGIC HISTORY:   CLL (chronic lymphoid leukemia) with failed remission   07/02/2010 Procedure LN biopsy confirmed CLL   08/18/2010 Procedure Bone marrow biopsy confirmed CLL   11/18/2010 - 04/15/2011 Chemotherapy patient received 6 cycles of FLudarabine and Rituximab   01/13/2012 Relapse/Recurrence Repeat BM biopsy confirmed relapse   01/27/2012 - 06/23/2012 Chemotherapy Bendamustine & Rituximab given, complicated by infusion reaction to Rituximab, completed 6 cycles   06/29/2013 Imaging Ct scan confirmed disease relapse with bulky lymphadenopathy   07/23/2013 Procedure Repeat BM biopsy due to thrombocytopenia and progressive luekocytosis   08/02/2013 Relapse/Recurrence Patient consented to start iburitinib as salvage therapy for relapsed CLL   11/09/2013 Imaging Repeat CT scan the chest, abdomen and pelvis showed greater than 50% response to treatment    INTERVAL HISTORY: Erin Moore 68 y.o. female returns for further followup. She is doing very well. She had occasional bruising. She denies any recent fever, chills, night sweats or abnormal weight loss  I have reviewed the past medical history, past surgical history, social history and family history with the patient and they are unchanged from previous note.  ALLERGIES:  is allergic to azithromycin and ibuprofen.  MEDICATIONS:  Current Outpatient Prescriptions  Medication Sig Dispense Refill  . acyclovir (ZOVIRAX) 400 MG tablet Take 400 mg by mouth daily.      Marland Kitchen allopurinol (ZYLOPRIM) 300 MG tablet Take 300 mg by mouth every morning.      Marland Kitchen amLODipine (NORVASC) 10 MG tablet Take 5 mg by mouth daily.      . Carboxymethylcellul-Glycerin (REFRESH OPTIVE SENSITIVE OP) Place 1 drop into both eyes 2 (two) times daily.       . Cholecalciferol 2000 UNITS CAPS Take 1  capsule by mouth 3 (three) times daily.      . citalopram (CELEXA) 20 MG tablet Take 20 mg by mouth every morning.       . Coenzyme Q10 50 MG CAPS Take 1 capsule by mouth every evening.      . ibrutinib (IMBRUVICA) 140 MG capsul Take 3 capsules (420 mg total) by mouth daily.  90 capsule  3  . LECITHIN PO Take 4 tablets by mouth daily.       Marland Kitchen losartan-hydrochlorothiazide (HYZAAR) 100-12.5 MG per tablet Take 1 tablet by mouth every morning.       . magnesium oxide (MAG-OX) 400 MG tablet Take 400 mg by mouth daily.      Marland Kitchen OVER THE COUNTER MEDICATION Take 2 tablets by mouth daily. IMMUNE STIMULATOR      . OVER THE COUNTER MEDICATION Take 2 tablets by mouth daily. PROBIOTIC 11      . OVER THE COUNTER MEDICATION Take 1 Can by mouth every Monday, Wednesday, and Friday. WHEY PROTEIN DRINKS      . OVER THE COUNTER MEDICATION Take 15 mLs by mouth daily with lunch. Liquid Chlorophyll      . OVER THE COUNTER MEDICATION Take 2 tablets by mouth 2 (two) times daily. Cordycepts      . OVER THE COUNTER MEDICATION Take 2 tablets by mouth 2 (two) times daily. Skeletal Strength      . OVER THE COUNTER MEDICATION Take 1 tablet by mouth 2 (two) times daily. Stress relief      . Potassium 99 MG TABS Take 1 tablet by mouth 2 (two) times  daily.      Marland Kitchen UNABLE TO FIND Take 1 tablet by mouth 4 (four) times daily. Protease Plus       No current facility-administered medications for this visit.   Facility-Administered Medications Ordered in Other Visits  Medication Dose Route Frequency Provider Last Rate Last Dose  . LORazepam (ATIVAN) tablet 0.5 mg  0.5 mg Oral UD Lennis Marion Downer, MD   0.5 mg at 01/27/12 1011    REVIEW OF SYSTEMS:   Constitutional: Denies fevers, chills or abnormal weight loss Eyes: Denies blurriness of vision Ears, nose, mouth, throat, and face: Denies mucositis or sore throat Respiratory: Denies cough, dyspnea or wheezes Cardiovascular: Denies palpitation, chest discomfort or lower extremity  swelling Gastrointestinal:  Denies nausea, heartburn or change in bowel habits Skin: Denies abnormal skin rashes Lymphatics: Denies new lymphadenopathy or easy bruising Neurological:Denies numbness, tingling or new weaknesses Behavioral/Psych: Mood is stable, no new changes  All other systems were reviewed with the patient and are negative.  PHYSICAL EXAMINATION: ECOG PERFORMANCE STATUS: 0 - Asymptomatic  Filed Vitals:   12/18/13 0835  BP: 136/72  Pulse: 72  Temp:   Resp:    Filed Weights   12/18/13 0829  Weight: 239 lb 4.8 oz (108.546 kg)    GENERAL:alert, no distress and comfortable. She is morbidly obese SKIN: skin color, texture, turgor are normal, no rashes or significant lesions EYES: normal, Conjunctiva are pink and non-injected, sclera clear OROPHARYNX:no exudate, no erythema and lips, buccal mucosa, and tongue normal  NECK: supple, thyroid normal size, non-tender, without nodularity LYMPH:  no palpable lymphadenopathy in the cervical, axillary or inguinal LUNGS: clear to auscultation and percussion with normal breathing effort HEART: regular rate & rhythm and no murmurs and no lower extremity edema ABDOMEN:abdomen soft, non-tender and normal bowel sounds Musculoskeletal:no cyanosis of digits and no clubbing  NEURO: alert & oriented x 3 with fluent speech, no focal motor/sensory deficits  LABORATORY DATA:  I have reviewed the data as listed    Component Value Date/Time   NA 143 12/18/2013 0812   NA 141 05/19/2012 1036   K 4.0 12/18/2013 0812   K 4.3 05/19/2012 1036   CL 98 02/02/2013 1353   CL 102 05/19/2012 1036   CO2 32* 12/18/2013 0812   CO2 30 05/19/2012 1036   GLUCOSE 90 12/18/2013 0812   GLUCOSE 99 02/02/2013 1353   GLUCOSE 85 05/19/2012 1036   BUN 18.5 12/18/2013 0812   BUN 21 05/19/2012 1036   CREATININE 1.0 12/18/2013 0812   CREATININE 0.96 05/19/2012 1036   CALCIUM 10.0 12/18/2013 0812   CALCIUM 9.2 05/19/2012 1036   PROT 6.2* 12/18/2013 0812   PROT 5.7* 05/19/2012 1036    ALBUMIN 4.0 12/18/2013 0812   ALBUMIN 4.2 05/19/2012 1036   AST 14 12/18/2013 0812   AST 20 05/19/2012 1036   ALT 10 12/18/2013 0812   ALT 22 05/19/2012 1036   ALKPHOS 65 12/18/2013 0812   ALKPHOS 106 05/19/2012 1036   BILITOT 0.44 12/18/2013 0812   BILITOT 0.5 05/19/2012 1036   GFRNONAA 56* 08/31/2010 1429   GFRAA  Value: >60        The eGFR has been calculated using the MDRD equation. This calculation has not been validated in all clinical situations. eGFR's persistently <60 mL/min signify possible Chronic Kidney Disease. 08/31/2010 1429    No results found for this basename: SPEP,  UPEP,   kappa and lambda light chains    Lab Results  Component Value Date  WBC 37.8* 12/18/2013   NEUTROABS 5.5 12/18/2013   HGB 12.3 12/18/2013   HCT 38.4 12/18/2013   MCV 87.1 12/18/2013   PLT 147 12/18/2013      Chemistry      Component Value Date/Time   NA 143 12/18/2013 0812   NA 141 05/19/2012 1036   K 4.0 12/18/2013 0812   K 4.3 05/19/2012 1036   CL 98 02/02/2013 1353   CL 102 05/19/2012 1036   CO2 32* 12/18/2013 0812   CO2 30 05/19/2012 1036   BUN 18.5 12/18/2013 0812   BUN 21 05/19/2012 1036   CREATININE 1.0 12/18/2013 0812   CREATININE 0.96 05/19/2012 1036      Component Value Date/Time   CALCIUM 10.0 12/18/2013 0812   CALCIUM 9.2 05/19/2012 1036   ALKPHOS 65 12/18/2013 0812   ALKPHOS 106 05/19/2012 1036   AST 14 12/18/2013 0812   AST 20 05/19/2012 1036   ALT 10 12/18/2013 0812   ALT 22 05/19/2012 1036   BILITOT 0.44 12/18/2013 0812   BILITOT 0.5 05/19/2012 1036     ASSESSMENT & PLAN:  #1 CLL #2 anemia, resolved #3 thrombocytopenia, resolved #4 extreme leukocytosis, improving She has significant good response to treatment. I recommend we continue without dose adjustment. I will see her in 3 months with repeat blood work, history and physical exam #5 hypertension This is due to side effects of treatment. It is improving All questions were answered. The patient knows to call the clinic with any problems, questions or concerns. No  barriers to learning was detected. I spent 15 minutes counseling the patient face to face. The total time spent in the appointment was 20 minutes and more than 50% was on counseling and review of test results     [facility], Daniel, MD 12/18/2013 9:06 AM

## 2013-12-25 ENCOUNTER — Encounter: Payer: Self-pay | Admitting: *Deleted

## 2013-12-25 NOTE — Progress Notes (Signed)
RECEIVED A FAX FROM [facility] CONCERNING A CONFIRMATION OF PRESCRIPTION SHIPMENT FOR [place] ON 12/24/13.

## 2014-01-17 ENCOUNTER — Telehealth: Payer: Self-pay | Admitting: *Deleted

## 2014-01-17 ENCOUNTER — Other Ambulatory Visit: Payer: Self-pay | Admitting: Hematology and Oncology

## 2014-01-17 DIAGNOSIS — C9110 Chronic lymphocytic leukemia of B-cell type not having achieved remission: Secondary | ICD-10-CM

## 2014-01-17 DIAGNOSIS — D696 Thrombocytopenia, unspecified: Secondary | ICD-10-CM

## 2014-01-17 DIAGNOSIS — R5383 Other fatigue: Secondary | ICD-10-CM

## 2014-01-17 DIAGNOSIS — I10 Essential (primary) hypertension: Secondary | ICD-10-CM

## 2014-01-17 DIAGNOSIS — C9190 Lymphoid leukemia, unspecified not having achieved remission: Secondary | ICD-10-CM

## 2014-01-17 MED ORDER — IBRUTINIB 140 MG PO CAPS: 420.0000 mg | ORAL_CAPSULE | Freq: Every day | ORAL | Status: DC

## 2014-01-17 NOTE — Telephone Encounter (Signed)
Caregiver states pt needs refill on Imbruvica sent to [facility] for refill.

## 2014-01-17 NOTE — Telephone Encounter (Signed)
Rx for Imbruvica faxed to [facility] at fax [phone].

## 2014-01-18 NOTE — Telephone Encounter (Signed)
RECEIVED A FAX FROM [facility] CONCERNING A CONFIRMATION OF FACSIMILE RECEIPT FOR PT. REFERRAL. 

## 2014-01-23 ENCOUNTER — Telehealth: Payer: Self-pay | Admitting: *Deleted

## 2014-01-23 NOTE — Telephone Encounter (Signed)
Tell her to take one full tab

## 2014-01-23 NOTE — Telephone Encounter (Signed)
Sister states pt's BP has been running high this past week in spite of taking 1/2 tablet of a 10 mg Norvasc at Bedtime.  It has been as high as 183/90 and this morning it was 173/79 even after resting for 40 minutes.

## 2014-01-23 NOTE — Telephone Encounter (Signed)
Instructed sister to have pt take a whole pill (norvasc 10 mg) at bedtime, continue to monitor BP and let us know if it does not improve. She verbalized understanding.

## 2014-01-24 ENCOUNTER — Encounter: Payer: Self-pay | Admitting: *Deleted

## 2014-01-24 NOTE — Progress Notes (Signed)
RECEIVED A FAX FROM [facility] CONCERNING A CONFIRMATION OF PRESCRIPTION SHIPMENT FOR [facility] ON 01/21/14.

## 2014-02-14 ENCOUNTER — Telehealth: Payer: Self-pay | Admitting: *Deleted

## 2014-02-14 NOTE — Telephone Encounter (Signed)
Sister left VM asking if ok for pt to have Dentist appt next month?

## 2014-02-14 NOTE — Telephone Encounter (Signed)
Yes, OK 

## 2014-02-15 ENCOUNTER — Telehealth: Payer: Self-pay | Admitting: *Deleted

## 2014-02-15 NOTE — Telephone Encounter (Signed)
Informed pt that it is ok for routine cleaning/ check up at Dentist per Dr. Alvy Bimler.

## 2014-02-21 ENCOUNTER — Telehealth: Payer: Self-pay | Admitting: *Deleted

## 2014-02-21 NOTE — Telephone Encounter (Signed)
[facility] sent facsimile confirmation of Imbruvica prescription shipment.  Imbruvica was shipped on 02-20-2014 with next business day delivery.

## 2014-03-18 ENCOUNTER — Other Ambulatory Visit: Payer: Self-pay

## 2014-03-18 DIAGNOSIS — Z1231 Encounter for screening mammogram for malignant neoplasm of breast: Secondary | ICD-10-CM

## 2014-03-21 ENCOUNTER — Other Ambulatory Visit (HOSPITAL_BASED_OUTPATIENT_CLINIC_OR_DEPARTMENT_OTHER): Payer: Medicare Other

## 2014-03-21 ENCOUNTER — Encounter: Payer: Self-pay | Admitting: Hematology and Oncology

## 2014-03-21 ENCOUNTER — Telehealth: Payer: Self-pay | Admitting: Hematology and Oncology

## 2014-03-21 ENCOUNTER — Ambulatory Visit (HOSPITAL_BASED_OUTPATIENT_CLINIC_OR_DEPARTMENT_OTHER): Payer: Medicare Other | Admitting: Hematology and Oncology

## 2014-03-21 VITALS — BP 138/56 | HR 66 | Temp 97.4°F | Resp 20 | Ht 63.0 in | Wt 248.8 lb

## 2014-03-21 DIAGNOSIS — D696 Thrombocytopenia, unspecified: Secondary | ICD-10-CM

## 2014-03-21 DIAGNOSIS — C9110 Chronic lymphocytic leukemia of B-cell type not having achieved remission: Secondary | ICD-10-CM

## 2014-03-21 DIAGNOSIS — C9190 Lymphoid leukemia, unspecified not having achieved remission: Secondary | ICD-10-CM

## 2014-03-21 DIAGNOSIS — J029 Acute pharyngitis, unspecified: Secondary | ICD-10-CM

## 2014-03-21 DIAGNOSIS — I10 Essential (primary) hypertension: Secondary | ICD-10-CM

## 2014-03-21 LAB — CBC & DIFF AND RETIC
BASO%: 0.3 % (ref 0.0–2.0)
Basophils Absolute: 0 10*3/uL (ref 0.0–0.1)
EOS%: 0.7 % (ref 0.0–7.0)
Eosinophils Absolute: 0.1 10*3/uL (ref 0.0–0.5)
HCT: 39.7 % (ref 34.8–46.6)
HGB: 13.1 g/dL (ref 11.6–15.9)
Immature Retic Fract: 3.8 % (ref 1.60–10.00)
LYMPH%: 66.4 % — ABNORMAL HIGH (ref 14.0–49.7)
MCH: 28.7 pg (ref 25.1–34.0)
MCHC: 33 g/dL (ref 31.5–36.0)
MCV: 87.1 fL (ref 79.5–101.0)
MONO#: 0.5 10*3/uL (ref 0.1–0.9)
MONO%: 3.9 % (ref 0.0–14.0)
NEUT#: 3.4 10*3/uL (ref 1.5–6.5)
NEUT%: 28.7 % — ABNORMAL LOW (ref 38.4–76.8)
Platelets: 119 10*3/uL — ABNORMAL LOW (ref 145–400)
RBC: 4.56 10*6/uL (ref 3.70–5.45)
RDW: 13.9 % (ref 11.2–14.5)
Retic %: 1.33 % (ref 0.70–2.10)
Retic Ct Abs: 60.65 10*3/uL (ref 33.70–90.70)
WBC: 11.9 10*3/uL — ABNORMAL HIGH (ref 3.9–10.3)
lymph#: 7.9 10*3/uL — ABNORMAL HIGH (ref 0.9–3.3)

## 2014-03-21 LAB — COMPREHENSIVE METABOLIC PANEL (CC13)
ALT: 15 U/L (ref 0–55)
AST: 14 U/L (ref 5–34)
Albumin: 3.8 g/dL (ref 3.5–5.0)
Alkaline Phosphatase: 65 U/L (ref 40–150)
Anion Gap: 12 mEq/L — ABNORMAL HIGH (ref 3–11)
BUN: 16.5 mg/dL (ref 7.0–26.0)
CO2: 28 mEq/L (ref 22–29)
Calcium: 9.5 mg/dL (ref 8.4–10.4)
Chloride: 102 mEq/L (ref 98–109)
Creatinine: 0.9 mg/dL (ref 0.6–1.1)
Glucose: 97 mg/dl (ref 70–140)
Potassium: 3.9 mEq/L (ref 3.5–5.1)
Sodium: 142 mEq/L (ref 136–145)
Total Bilirubin: 0.44 mg/dL (ref 0.20–1.20)
Total Protein: 6 g/dL — ABNORMAL LOW (ref 6.4–8.3)

## 2014-03-21 NOTE — Progress Notes (Signed)
[facility] OFFICE PROGRESS NOTE  Patient Care Team: Lilian Coma, MD as PCP - General (Family Medicine)  SUMMARY OF ONCOLOGIC HISTORY:   CLL (chronic lymphoid leukemia) with failed remission   07/02/2010 Procedure LN biopsy confirmed CLL   08/18/2010 Procedure Bone marrow biopsy confirmed CLL   11/18/2010 - 04/15/2011 Chemotherapy patient received 6 cycles of FLudarabine and Rituximab   01/13/2012 Relapse/Recurrence Repeat BM biopsy confirmed relapse   01/27/2012 - 06/23/2012 Chemotherapy Bendamustine & Rituximab given, complicated by infusion reaction to Rituximab, completed 6 cycles   06/29/2013 Imaging Ct scan confirmed disease relapse with bulky lymphadenopathy   07/23/2013 Procedure Repeat BM biopsy due to thrombocytopenia and progressive luekocytosis   08/02/2013 Relapse/Recurrence Patient consented to start iburitinib as salvage therapy for relapsed CLL   11/09/2013 Imaging Repeat CT scan the chest, abdomen and pelvis showed greater than 50% response to treatment    INTERVAL HISTORY: Please see below for problem oriented charting. She is tolerating chemotherapy well. She has some mild bruising. The patient denies any recent signs or symptoms of bleeding such as spontaneous epistaxis, hematuria or hematochezia. She denies any recent fever, chills, night sweats or abnormal weight loss   REVIEW OF SYSTEMS:   Constitutional: Denies fevers, chills or abnormal weight loss Eyes: Denies blurriness of vision Ears, nose, mouth, throat, and face: Denies mucositis or sore throat Respiratory: Denies cough, dyspnea or wheezes Cardiovascular: Denies palpitation, chest discomfort or lower extremity swelling Gastrointestinal:  Denies nausea, heartburn or change in bowel habits Skin: Denies abnormal skin rashes Lymphatics: Denies new lymphadenopathy  Neurological:Denies numbness, tingling or new weaknesses Behavioral/Psych: Mood is stable, no new changes  All other systems were  reviewed with the patient and are negative.  I have reviewed the past medical history, past surgical history, social history and family history with the patient and they are unchanged from previous note.  ALLERGIES:  is allergic to azithromycin and ibuprofen.  MEDICATIONS:  Current Outpatient Prescriptions  Medication Sig Dispense Refill  . acyclovir (ZOVIRAX) 400 MG tablet Take 400 mg by mouth daily.      Marland Kitchen allopurinol (ZYLOPRIM) 300 MG tablet Take 300 mg by mouth every morning.      Marland Kitchen amLODipine (NORVASC) 10 MG tablet Take 5 mg by mouth daily.      . Carboxymethylcellul-Glycerin (REFRESH OPTIVE SENSITIVE OP) Place 1 drop into both eyes 2 (two) times daily.       . Cholecalciferol 2000 UNITS CAPS Take 1 capsule by mouth 3 (three) times daily.      . citalopram (CELEXA) 20 MG tablet Take 20 mg by mouth every morning.       . Coenzyme Q10 50 MG CAPS Take 1 capsule by mouth every evening.      . ibrutinib (IMBRUVICA) 140 MG capsul Take 3 capsules (420 mg total) by mouth daily.  90 capsule  3  . LECITHIN PO Take 4 tablets by mouth daily.       Marland Kitchen losartan-hydrochlorothiazide (HYZAAR) 100-12.5 MG per tablet Take 1 tablet by mouth every morning.       . magnesium oxide (MAG-OX) 400 MG tablet Take 400 mg by mouth daily.      Marland Kitchen OVER THE COUNTER MEDICATION Take 2 tablets by mouth daily. IMMUNE STIMULATOR      . OVER THE COUNTER MEDICATION Take 2 tablets by mouth daily. PROBIOTIC 11      . OVER THE COUNTER MEDICATION Take 1 Can by mouth every Monday, Wednesday, and Friday. WHEY PROTEIN  DRINKS      . OVER THE COUNTER MEDICATION Take 15 mLs by mouth daily with lunch. Liquid Chlorophyll      . OVER THE COUNTER MEDICATION Take 2 tablets by mouth 2 (two) times daily. Cordycepts      . OVER THE COUNTER MEDICATION Take 2 tablets by mouth 2 (two) times daily. Skeletal Strength      . OVER THE COUNTER MEDICATION Take 1 tablet by mouth 2 (two) times daily. Stress relief      . Potassium 99 MG TABS Take 1  tablet by mouth 2 (two) times daily.      Marland Kitchen UNABLE TO FIND Take 1 tablet by mouth 4 (four) times daily. Protease Plus       No current facility-administered medications for this visit.   Facility-Administered Medications Ordered in Other Visits  Medication Dose Route Frequency Provider Last Rate Last Dose  . LORazepam (ATIVAN) tablet 0.5 mg  0.5 mg Oral UD Lennis P Livesay, MD   0.5 mg at 01/27/12 1011    PHYSICAL EXAMINATION: ECOG PERFORMANCE STATUS: 1 - Symptomatic but completely ambulatory  Filed Vitals:   03/21/14 0827  BP: 138/56  Pulse: 66  Temp:   Resp:    Filed Weights   03/21/14 0820  Weight: 248 lb 12.8 oz (112.855 kg)    GENERAL:alert, no distress and comfortable. She is morbidly obese SKIN: skin color, texture, turgor are normal, no rashes or significant lesions. Some minor bruising is noted EYES: normal, Conjunctiva are pink and non-injected, sclera clear OROPHARYNX:no exudate, no erythema and lips, buccal mucosa, and tongue normal  NECK: supple, thyroid normal size, non-tender, without nodularity LYMPH:  no palpable lymphadenopathy in the cervical, axillary or inguinal LUNGS: clear to auscultation and percussion with normal breathing effort HEART: regular rate & rhythm and no murmurs and no lower extremity edema ABDOMEN:abdomen soft, non-tender and normal bowel sounds. She does not have palpable splenomegaly. Musculoskeletal:no cyanosis of digits and no clubbing  NEURO: alert & oriented x 3 with fluent speech, no focal motor/sensory deficits  LABORATORY DATA:  I have reviewed the data as listed    Component Value Date/Time   NA 142 03/21/2014 0809   NA 141 05/19/2012 1036   K 3.9 03/21/2014 0809   K 4.3 05/19/2012 1036   CL 98 02/02/2013 1353   CL 102 05/19/2012 1036   CO2 28 03/21/2014 0809   CO2 30 05/19/2012 1036   GLUCOSE 97 03/21/2014 0809   GLUCOSE 99 02/02/2013 1353   GLUCOSE 85 05/19/2012 1036   BUN 16.5 03/21/2014 0809   BUN 21 05/19/2012 1036   CREATININE 0.9  03/21/2014 0809   CREATININE 0.96 05/19/2012 1036   CALCIUM 9.5 03/21/2014 0809   CALCIUM 9.2 05/19/2012 1036   PROT 6.0* 03/21/2014 0809   PROT 5.7* 05/19/2012 1036   ALBUMIN 3.8 03/21/2014 0809   ALBUMIN 4.2 05/19/2012 1036   AST 14 03/21/2014 0809   AST 20 05/19/2012 1036   ALT 15 03/21/2014 0809   ALT 22 05/19/2012 1036   ALKPHOS 65 03/21/2014 0809   ALKPHOS 106 05/19/2012 1036   BILITOT 0.44 03/21/2014 0809   BILITOT 0.5 05/19/2012 1036   GFRNONAA 56* 08/31/2010 1429   GFRAA  Value: >60        The eGFR has been calculated using the MDRD equation. This calculation has not been validated in all clinical situations. eGFR's persistently <60 mL/min signify possible Chronic Kidney Disease. 08/31/2010 1429    No results found for  this basename: SPEP, UPEP,  kappa and lambda light chains    Lab Results  Component Value Date   WBC 11.9* 03/21/2014   NEUTROABS 3.4 03/21/2014   HGB 13.1 03/21/2014   HCT 39.7 03/21/2014   MCV 87.1 03/21/2014   PLT 119* 03/21/2014      Chemistry      Component Value Date/Time   NA 142 03/21/2014 0809   NA 141 05/19/2012 1036   K 3.9 03/21/2014 0809   K 4.3 05/19/2012 1036   CL 98 02/02/2013 1353   CL 102 05/19/2012 1036   CO2 28 03/21/2014 0809   CO2 30 05/19/2012 1036   BUN 16.5 03/21/2014 0809   BUN 21 05/19/2012 1036   CREATININE 0.9 03/21/2014 0809   CREATININE 0.96 05/19/2012 1036      Component Value Date/Time   CALCIUM 9.5 03/21/2014 0809   CALCIUM 9.2 05/19/2012 1036   ALKPHOS 65 03/21/2014 0809   ALKPHOS 106 05/19/2012 1036   AST 14 03/21/2014 0809   AST 20 05/19/2012 1036   ALT 15 03/21/2014 0809   ALT 22 05/19/2012 1036   BILITOT 0.44 03/21/2014 0809   BILITOT 0.5 05/19/2012 1036     ASSESSMENT & PLAN:  HTN (hypertension) She is not symptomatic. Continue her current blood pressure medication.  CLL (chronic lymphoid leukemia) with failed remission She tolerated chemotherapy well apart from some minor bruising. She denies diarrhea. I recommend continue the same treatment. Before she returns in 3  months, I plan to repeat imaging studying to document response rate to treatment.  Thrombocytopenia, unspecified This is related to her underlying disease and splenomegaly. Overall, it is stable. She is not symptomatic apart from some minor bruising. Continue observation.   Orders Placed This Encounter  Procedures  . CT Chest W Contrast    Standing Status: Future     Number of Occurrences:      Standing Expiration Date: 05/21/2015    Order Specific Question:  Reason for Exam (SYMPTOM  OR DIAGNOSIS REQUIRED)    Answer:  staging CLL, assess response to Rx    Order Specific Question:  Preferred imaging location?    Answer:  [facility]  . CT Abdomen Pelvis W Contrast    Standing Status: Future     Number of Occurrences:      Standing Expiration Date: 06/21/2015    Order Specific Question:  Reason for Exam (SYMPTOM  OR DIAGNOSIS REQUIRED)    Answer:  staging CLL, assess response to Rx    Order Specific Question:  Preferred imaging location?    Answer:  [facility]   All questions were answered. The patient knows to call the clinic with any problems, questions or concerns. No barriers to learning was detected. I spent 25 minutes counseling the patient face to face. The total time spent in the appointment was 30 minutes and more than 50% was on counseling and review of test results     Heath Lark, MD 03/21/2014 7:36 PM

## 2014-03-21 NOTE — Telephone Encounter (Signed)
gv adn printed appts ched and avs for pt fro Aug and SEpt...gv pt barium

## 2014-03-21 NOTE — Assessment & Plan Note (Signed)
She is not symptomatic. Continue her current blood pressure medication.   

## 2014-03-21 NOTE — Assessment & Plan Note (Signed)
She tolerated chemotherapy well apart from some minor bruising. She denies diarrhea. I recommend continue the same treatment. Before she returns in 3 months, I plan to repeat imaging studying to document response rate to treatment.

## 2014-03-21 NOTE — Assessment & Plan Note (Signed)
This is related to her underlying disease and splenomegaly. Overall, it is stable. She is not symptomatic apart from some minor bruising. Continue observation.

## 2014-04-17 ENCOUNTER — Ambulatory Visit
Admission: RE | Admit: 2014-04-17 | Discharge: 2014-04-17 | Disposition: A | Discharge status: Home / Self Care | Payer: Medicare Other | Source: Ambulatory Visit

## 2014-04-17 DIAGNOSIS — Z1231 Encounter for screening mammogram for malignant neoplasm of breast: Secondary | ICD-10-CM

## 2014-05-09 ENCOUNTER — Other Ambulatory Visit: Payer: Self-pay | Admitting: Hematology and Oncology

## 2014-05-17 ENCOUNTER — Other Ambulatory Visit: Payer: Self-pay | Admitting: *Deleted

## 2014-05-17 DIAGNOSIS — C9190 Lymphoid leukemia, unspecified not having achieved remission: Secondary | ICD-10-CM

## 2014-05-17 DIAGNOSIS — C9110 Chronic lymphocytic leukemia of B-cell type not having achieved remission: Secondary | ICD-10-CM

## 2014-05-17 MED ORDER — IBRUTINIB 140 MG PO CAPS: 420.0000 mg | ORAL_CAPSULE | Freq: Every day | ORAL | Status: DC

## 2014-05-17 NOTE — Telephone Encounter (Signed)
RECEIVED A FAX FROM [facility] CONCERNING A CONFIRMATION OF FACSIMILE RECEIPT FOR PT. REFERRAL. 

## 2014-05-17 NOTE — Telephone Encounter (Signed)
THIS REFILL REQUEST FOR IMBRUVICA WAS PLACED ON DR.GORSUCH'S DESK.

## 2014-06-17 ENCOUNTER — Ambulatory Visit (HOSPITAL_COMMUNITY)
Admission: RE | Admit: 2014-06-17 | Discharge: 2014-06-17 | Disposition: A | Discharge status: Home / Self Care | Payer: Medicare Other | Source: Ambulatory Visit | Attending: Hematology and Oncology | Admitting: Hematology and Oncology

## 2014-06-17 ENCOUNTER — Other Ambulatory Visit (HOSPITAL_BASED_OUTPATIENT_CLINIC_OR_DEPARTMENT_OTHER): Payer: Medicare Other

## 2014-06-17 ENCOUNTER — Encounter (HOSPITAL_COMMUNITY): Payer: Self-pay

## 2014-06-17 DIAGNOSIS — J029 Acute pharyngitis, unspecified: Secondary | ICD-10-CM

## 2014-06-17 DIAGNOSIS — R599 Enlarged lymph nodes, unspecified: Secondary | ICD-10-CM | POA: Insufficient documentation

## 2014-06-17 DIAGNOSIS — D696 Thrombocytopenia, unspecified: Secondary | ICD-10-CM

## 2014-06-17 DIAGNOSIS — Z9221 Personal history of antineoplastic chemotherapy: Secondary | ICD-10-CM | POA: Insufficient documentation

## 2014-06-17 DIAGNOSIS — C9190 Lymphoid leukemia, unspecified not having achieved remission: Secondary | ICD-10-CM

## 2014-06-17 DIAGNOSIS — C9110 Chronic lymphocytic leukemia of B-cell type not having achieved remission: Secondary | ICD-10-CM | POA: Insufficient documentation

## 2014-06-17 LAB — COMPREHENSIVE METABOLIC PANEL (CC13)
ALT: 18 U/L (ref 0–55)
AST: 15 U/L (ref 5–34)
Albumin: 4 g/dL (ref 3.5–5.0)
Alkaline Phosphatase: 64 U/L (ref 40–150)
Anion Gap: 8 mEq/L (ref 3–11)
BUN: 14.6 mg/dL (ref 7.0–26.0)
CO2: 33 mEq/L — ABNORMAL HIGH (ref 22–29)
Calcium: 9.5 mg/dL (ref 8.4–10.4)
Chloride: 99 mEq/L (ref 98–109)
Creatinine: 1.1 mg/dL (ref 0.6–1.1)
Glucose: 92 mg/dl (ref 70–140)
Potassium: 3.9 mEq/L (ref 3.5–5.1)
Sodium: 140 mEq/L (ref 136–145)
Total Bilirubin: 0.53 mg/dL (ref 0.20–1.20)
Total Protein: 6.3 g/dL — ABNORMAL LOW (ref 6.4–8.3)

## 2014-06-17 LAB — CBC & DIFF AND RETIC
BASO%: 0.5 % (ref 0.0–2.0)
Basophils Absolute: 0 10*3/uL (ref 0.0–0.1)
EOS%: 1.1 % (ref 0.0–7.0)
Eosinophils Absolute: 0.1 10*3/uL (ref 0.0–0.5)
HCT: 39.9 % (ref 34.8–46.6)
HGB: 13.2 g/dL (ref 11.6–15.9)
Immature Retic Fract: 7.8 % (ref 1.60–10.00)
LYMPH%: 21.6 % (ref 14.0–49.7)
MCH: 29.1 pg (ref 25.1–34.0)
MCHC: 33.1 g/dL (ref 31.5–36.0)
MCV: 87.9 fL (ref 79.5–101.0)
MONO#: 0.5 10*3/uL (ref 0.1–0.9)
MONO%: 8.4 % (ref 0.0–14.0)
NEUT#: 4.3 10*3/uL (ref 1.5–6.5)
NEUT%: 68.4 % (ref 38.4–76.8)
Platelets: 127 10*3/uL — ABNORMAL LOW (ref 145–400)
RBC: 4.54 10*6/uL (ref 3.70–5.45)
RDW: 13.4 % (ref 11.2–14.5)
Retic %: 2.18 % — ABNORMAL HIGH (ref 0.70–2.10)
Retic Ct Abs: 98.97 10*3/uL — ABNORMAL HIGH (ref 33.70–90.70)
WBC: 6.2 10*3/uL (ref 3.9–10.3)
lymph#: 1.3 10*3/uL (ref 0.9–3.3)

## 2014-06-17 LAB — TECHNOLOGIST REVIEW

## 2014-06-17 MED ORDER — IOHEXOL 300 MG/ML  SOLN
100.0000 mL | Freq: Once | INTRAMUSCULAR | Status: AC | PRN
  Administered 2014-06-17: 100 mL via INTRAVENOUS

## 2014-06-20 ENCOUNTER — Encounter: Payer: Self-pay | Admitting: Hematology and Oncology

## 2014-06-20 ENCOUNTER — Ambulatory Visit (HOSPITAL_BASED_OUTPATIENT_CLINIC_OR_DEPARTMENT_OTHER): Payer: Medicare Other | Admitting: Hematology and Oncology

## 2014-06-20 VITALS — BP 136/69 | HR 73 | Temp 98.4°F | Resp 20 | Ht 63.0 in | Wt 264.6 lb

## 2014-06-20 DIAGNOSIS — C9110 Chronic lymphocytic leukemia of B-cell type not having achieved remission: Secondary | ICD-10-CM

## 2014-06-20 DIAGNOSIS — I10 Essential (primary) hypertension: Secondary | ICD-10-CM

## 2014-06-20 DIAGNOSIS — C9190 Lymphoid leukemia, unspecified not having achieved remission: Secondary | ICD-10-CM

## 2014-06-20 DIAGNOSIS — D696 Thrombocytopenia, unspecified: Secondary | ICD-10-CM

## 2014-06-20 DIAGNOSIS — Z23 Encounter for immunization: Secondary | ICD-10-CM

## 2014-06-20 DIAGNOSIS — E669 Obesity, unspecified: Secondary | ICD-10-CM

## 2014-06-20 MED ORDER — INFLUENZA VAC SPLIT QUAD 0.5 ML IM SUSY
0.5000 mL | PREFILLED_SYRINGE | Freq: Once | INTRAMUSCULAR | Status: AC
  Administered 2014-06-20: 0.5 mL via INTRAMUSCULAR
  Filled 2014-06-20: qty 0.5

## 2014-06-20 NOTE — Assessment & Plan Note (Signed)
This is related to her underlying disease. Overall, it is stable. She is not symptomatic apart from some minor bruising. Continue observation.

## 2014-06-20 NOTE — Assessment & Plan Note (Signed)
She is not symptomatic. Continue her current blood pressure medication.

## 2014-06-20 NOTE — Assessment & Plan Note (Signed)
She had gained a lot of weight. I recommend increase exercise and dietary modification.

## 2014-06-20 NOTE — Progress Notes (Signed)
[place] OFFICE PROGRESS NOTE  Patient Care Team: Lilian Coma, MD as PCP - General (Family Medicine)  SUMMARY OF ONCOLOGIC HISTORY:   CLL (chronic lymphoid leukemia) with failed remission   07/02/2010 Procedure LN biopsy confirmed CLL   08/18/2010 Procedure Bone marrow biopsy confirmed CLL   11/18/2010 - 04/15/2011 Chemotherapy patient received 6 cycles of FLudarabine and Rituximab   01/13/2012 Relapse/Recurrence Repeat BM biopsy confirmed relapse   01/27/2012 - 06/23/2012 Chemotherapy Bendamustine & Rituximab given, complicated by infusion reaction to Rituximab, completed 6 cycles   06/29/2013 Imaging Ct scan confirmed disease relapse with bulky lymphadenopathy   07/23/2013 Procedure Repeat BM biopsy due to thrombocytopenia and progressive luekocytosis   08/02/2013 Relapse/Recurrence Patient consented to start iburitinib as salvage therapy for relapsed CLL   11/09/2013 Imaging Repeat CT scan the chest, abdomen and pelvis showed greater than 50% response to treatment   06/17/2014 Imaging Repeat CT scan showed near complete response to treatment.    INTERVAL HISTORY: Please see below for problem oriented charting. She tolerated treatment well. Her only complaints are bruising.  REVIEW OF SYSTEMS:   Constitutional: Denies fevers, chills or abnormal weight loss Eyes: Denies blurriness of vision Ears, nose, mouth, throat, and face: Denies mucositis or sore throat Respiratory: Denies cough, dyspnea or wheezes Cardiovascular: Denies palpitation, chest discomfort or lower extremity swelling Gastrointestinal:  Denies nausea, heartburn or change in bowel habits Skin: Denies abnormal skin rashes Lymphatics: Denies new lymphadenopathy  Neurological:Denies numbness, tingling or new weaknesses Behavioral/Psych: Mood is stable, no new changes  All other systems were reviewed with the patient and are negative.  I have reviewed the past medical history, past surgical history, social  history and family history with the patient and they are unchanged from previous note.  ALLERGIES:  is allergic to azithromycin and ibuprofen.  MEDICATIONS:  Current Outpatient Prescriptions  Medication Sig Dispense Refill  . acyclovir (ZOVIRAX) 400 MG tablet Take 400 mg by mouth daily.      Marland Kitchen allopurinol (ZYLOPRIM) 300 MG tablet Take 300 mg by mouth every morning.      Marland Kitchen amLODipine (NORVASC) 10 MG tablet TAKE 1 TABLET (10 MG TOTAL) BY MOUTH DAILY.  90 tablet  1  . Carboxymethylcellul-Glycerin (REFRESH OPTIVE SENSITIVE OP) Place 1 drop into both eyes 2 (two) times daily.       . citalopram (CELEXA) 20 MG tablet Take 20 mg by mouth every morning.       . ibrutinib (IMBRUVICA) 140 MG capsul Take 3 capsules (420 mg total) by mouth daily.  90 capsule  3  . LECITHIN PO Take 4 tablets by mouth daily.       Marland Kitchen losartan-hydrochlorothiazide (HYZAAR) 100-12.5 MG per tablet Take 1 tablet by mouth every morning.       . magnesium oxide (MAG-OX) 400 MG tablet Take 750 mg by mouth daily.       Marland Kitchen OVER THE COUNTER MEDICATION Take 3 tablets by mouth daily. IMMUNE STIMULATOR      . OVER THE COUNTER MEDICATION Take 2 tablets by mouth daily. PROBIOTIC 11      . OVER THE COUNTER MEDICATION Take 1 Can by mouth every Monday, Wednesday, and Friday. WHEY PROTEIN DRINKS      . OVER THE COUNTER MEDICATION Take 15 mLs by mouth daily with lunch. Liquid Chlorophyll      . OVER THE COUNTER MEDICATION Take 2 tablets by mouth 2 (two) times daily. Cordycepts      . OVER THE COUNTER  MEDICATION Take 2 tablets by mouth 2 (two) times daily. Skeletal Strength      . OVER THE COUNTER MEDICATION Take 1 tablet by mouth daily. Stress relief      . Potassium 99 MG TABS Take 1 tablet by mouth 2 (two) times daily. 1 tablet in am and 2 at HS      . UNABLE TO FIND Take 1 tablet by mouth 4 (four) times daily. Protease Plus      . VITAMIN D, CHOLECALCIFEROL, PO Take 5,000 Units by mouth 2 (two) times daily at 10 AM and 5 PM. 5,000 units in  am and 15,000 u in pm       No current facility-administered medications for this visit.   Facility-Administered Medications Ordered in Other Visits  Medication Dose Route Frequency Provider Last Rate Last Dose  . LORazepam (ATIVAN) tablet 0.5 mg  0.5 mg Oral UD Lennis P Livesay, MD   0.5 mg at 01/27/12 1011    PHYSICAL EXAMINATION: ECOG PERFORMANCE STATUS: 0 - Asymptomatic  Filed Vitals:   06/20/14 0903  BP: 136/69  Pulse: 73  Temp:   Resp:    Filed Weights   06/20/14 0841  Weight: 264 lb 9.6 oz (120.022 kg)    GENERAL:alert, no distress and comfortable. She is morbidly obese SKIN: No significant bruising. No petechiae rash. EYES: normal, Conjunctiva are pink and non-injected, sclera clear OROPHARYNX:no exudate, no erythema and lips, buccal mucosa, and tongue normal  NECK: supple, thyroid normal size, non-tender, without nodularity LYMPH:  no palpable lymphadenopathy in the cervical, axillary or inguinal LUNGS: clear to auscultation and percussion with normal breathing effort HEART: regular rate & rhythm and no murmurs and no lower extremity edema ABDOMEN:abdomen soft, non-tender and normal bowel sounds Musculoskeletal:no cyanosis of digits and no clubbing  NEURO: alert & oriented x 3 with fluent speech, no focal motor/sensory deficits  LABORATORY DATA:  I have reviewed the data as listed    Component Value Date/Time   NA 140 06/17/2014 0857   NA 141 05/19/2012 1036   K 3.9 06/17/2014 0857   K 4.3 05/19/2012 1036   CL 98 02/02/2013 1353   CL 102 05/19/2012 1036   CO2 33* 06/17/2014 0857   CO2 30 05/19/2012 1036   GLUCOSE 92 06/17/2014 0857   GLUCOSE 99 02/02/2013 1353   GLUCOSE 85 05/19/2012 1036   BUN 14.6 06/17/2014 0857   BUN 21 05/19/2012 1036   CREATININE 1.1 06/17/2014 0857   CREATININE 0.96 05/19/2012 1036   CALCIUM 9.5 06/17/2014 0857   CALCIUM 9.2 05/19/2012 1036   PROT 6.3* 06/17/2014 0857   PROT 5.7* 05/19/2012 1036   ALBUMIN 4.0 06/17/2014 0857   ALBUMIN 4.2 05/19/2012 1036    AST 15 06/17/2014 0857   AST 20 05/19/2012 1036   ALT 18 06/17/2014 0857   ALT 22 05/19/2012 1036   ALKPHOS 64 06/17/2014 0857   ALKPHOS 106 05/19/2012 1036   BILITOT 0.53 06/17/2014 0857   BILITOT 0.5 05/19/2012 1036   GFRNONAA 56* 08/31/2010 1429   GFRAA  Value: >60        The eGFR has been calculated using the MDRD equation. This calculation has not been validated in all clinical situations. eGFR's persistently <60 mL/min signify possible Chronic Kidney Disease. 08/31/2010 1429    No results found for this basename: SPEP, UPEP,  kappa and lambda light chains    Lab Results  Component Value Date   WBC 6.2 06/17/2014   NEUTROABS 4.3 06/17/2014  HGB 13.2 06/17/2014   HCT 39.9 06/17/2014   MCV 87.9 06/17/2014   PLT 127* 06/17/2014      Chemistry      Component Value Date/Time   NA 140 06/17/2014 0857   NA 141 05/19/2012 1036   K 3.9 06/17/2014 0857   K 4.3 05/19/2012 1036   CL 98 02/02/2013 1353   CL 102 05/19/2012 1036   CO2 33* 06/17/2014 0857   CO2 30 05/19/2012 1036   BUN 14.6 06/17/2014 0857   BUN 21 05/19/2012 1036   CREATININE 1.1 06/17/2014 0857   CREATININE 0.96 05/19/2012 1036      Component Value Date/Time   CALCIUM 9.5 06/17/2014 0857   CALCIUM 9.2 05/19/2012 1036   ALKPHOS 64 06/17/2014 0857   ALKPHOS 106 05/19/2012 1036   AST 15 06/17/2014 0857   AST 20 05/19/2012 1036   ALT 18 06/17/2014 0857   ALT 22 05/19/2012 1036   BILITOT 0.53 06/17/2014 0857   BILITOT 0.5 05/19/2012 1036       RADIOGRAPHIC STUDIES: A review CT scan report with the patient and family. I have personally reviewed the radiological images as listed and agreed with the findings in the report.  ASSESSMENT & PLAN:  CLL (chronic lymphoid leukemia) with failed remission She had excellent response to treatment. CT scan showed near complete response. Her white blood cell count has normalized. I will continue current dosage of chemotherapy with the adjustment. I will see her back in 4 months for further assessment, history and  physical examination.  Obesity She had gained a lot of weight. I recommend increase exercise and dietary modification.  Thrombocytopenia, unspecified This is related to her underlying disease. Overall, it is stable. She is not symptomatic apart from some minor bruising. Continue observation.    HTN (hypertension) She is not symptomatic. Continue her current blood pressure medication.   We discussed the importance of preventive care and reviewed the vaccination programs. She does not have any prior allergic reactions to influenza vaccination. She agrees to proceed with influenza vaccination today and we will administer it today at the clinic.   No orders of the defined types were placed in this encounter.   All questions were answered. The patient knows to call the clinic with any problems, questions or concerns. No barriers to learning was detected. I spent 25 minutes counseling the patient face to face. The total time spent in the appointment was 30 minutes and more than 50% was on counseling and review of test results     [facility], Maronda Caison, MD 06/20/2014 9:22 AM

## 2014-06-20 NOTE — Assessment & Plan Note (Signed)
She had excellent response to treatment. CT scan showed near complete response. Her white blood cell count has normalized. I will continue current dosage of chemotherapy with the adjustment. I will see her back in 4 months for further assessment, history and physical examination.

## 2014-06-21 ENCOUNTER — Telehealth: Payer: Self-pay | Admitting: Hematology and Oncology

## 2014-06-21 ENCOUNTER — Encounter: Payer: Self-pay | Admitting: *Deleted

## 2014-06-21 NOTE — Telephone Encounter (Signed)
s.w. pt and advised on Jan 2016 appt....pt ok and aware °

## 2014-06-21 NOTE — Progress Notes (Signed)
RECEIVED A FAX FROM [facility] CONCERNING A CONFIRMATION OF PRESCRIPTION SHIPMENT FOR [place] ON 06/20/14.

## 2014-08-13 ENCOUNTER — Encounter (HOSPITAL_COMMUNITY): Payer: Self-pay

## 2014-08-16 ENCOUNTER — Telehealth: Payer: Self-pay | Admitting: Hematology and Oncology

## 2014-08-16 ENCOUNTER — Telehealth: Payer: Self-pay | Admitting: *Deleted

## 2014-08-16 NOTE — Telephone Encounter (Signed)
Informed caregiver of Thursday at 12:15 pm for lab and MD at 12:45 pm. She verbalized understanding.

## 2014-08-16 NOTE — Telephone Encounter (Signed)
Caregiver reports pt c/o jaw hurting on left upper side for several weeks on and off at night.  It hurt so bad last night that it kept pt awake.  It does not hurt during the day.  Pt also having more bruising lately.

## 2014-08-16 NOTE — Telephone Encounter (Signed)
Thursday 1245 pm with labs before

## 2014-08-16 NOTE — Telephone Encounter (Signed)
Labs/ov per 10/30 POF, caregiver aware of D/T..... KJ

## 2014-08-16 NOTE — Telephone Encounter (Signed)
She needs to make an appt with dentist to make sure it's not a tooth problem. I can see her on Tuesday morning with labs, 15 mins appt

## 2014-08-16 NOTE — Telephone Encounter (Signed)
Pt has dentist appt on Tuesday morning already.  Is there another time to come for lab and see Dr. Alvy Bimler?

## 2014-08-20 ENCOUNTER — Encounter: Payer: Self-pay | Admitting: *Deleted

## 2014-08-20 NOTE — Progress Notes (Signed)
RECEIVED A FAX FROM [facility] CONCERNING A CONFIRMATION OF PRESCRIPTION SHIPMENT FOR [place] ON 08/19/14.

## 2014-08-22 ENCOUNTER — Ambulatory Visit (HOSPITAL_BASED_OUTPATIENT_CLINIC_OR_DEPARTMENT_OTHER): Payer: Medicare Other | Admitting: Hematology and Oncology

## 2014-08-22 ENCOUNTER — Encounter: Payer: Self-pay | Admitting: Hematology and Oncology

## 2014-08-22 ENCOUNTER — Other Ambulatory Visit (HOSPITAL_BASED_OUTPATIENT_CLINIC_OR_DEPARTMENT_OTHER): Payer: Medicare Other

## 2014-08-22 VITALS — BP 122/75 | HR 74 | Temp 98.5°F | Resp 18 | Ht 63.0 in | Wt 263.6 lb

## 2014-08-22 DIAGNOSIS — C9190 Lymphoid leukemia, unspecified not having achieved remission: Secondary | ICD-10-CM

## 2014-08-22 DIAGNOSIS — C9110 Chronic lymphocytic leukemia of B-cell type not having achieved remission: Secondary | ICD-10-CM

## 2014-08-22 DIAGNOSIS — D696 Thrombocytopenia, unspecified: Secondary | ICD-10-CM

## 2014-08-22 DIAGNOSIS — J029 Acute pharyngitis, unspecified: Secondary | ICD-10-CM

## 2014-08-22 LAB — COMPREHENSIVE METABOLIC PANEL (CC13)
ALT: 24 U/L (ref 0–55)
AST: 17 U/L (ref 5–34)
Albumin: 4 g/dL (ref 3.5–5.0)
Alkaline Phosphatase: 72 U/L (ref 40–150)
Anion Gap: 9 mEq/L (ref 3–11)
BUN: 23 mg/dL (ref 7.0–26.0)
CO2: 31 mEq/L — ABNORMAL HIGH (ref 22–29)
Calcium: 9.8 mg/dL (ref 8.4–10.4)
Chloride: 101 mEq/L (ref 98–109)
Creatinine: 1.1 mg/dL (ref 0.6–1.1)
Glucose: 89 mg/dl (ref 70–140)
Potassium: 4.1 mEq/L (ref 3.5–5.1)
Sodium: 141 mEq/L (ref 136–145)
Total Bilirubin: 0.63 mg/dL (ref 0.20–1.20)
Total Protein: 6.3 g/dL — ABNORMAL LOW (ref 6.4–8.3)

## 2014-08-22 LAB — CBC & DIFF AND RETIC
BASO%: 0.5 % (ref 0.0–2.0)
Basophils Absolute: 0 10*3/uL (ref 0.0–0.1)
EOS%: 1.3 % (ref 0.0–7.0)
Eosinophils Absolute: 0.1 10*3/uL (ref 0.0–0.5)
HCT: 40.3 % (ref 34.8–46.6)
HGB: 13.5 g/dL (ref 11.6–15.9)
Immature Retic Fract: 4 % (ref 1.60–10.00)
LYMPH%: 17.6 % (ref 14.0–49.7)
MCH: 29.3 pg (ref 25.1–34.0)
MCHC: 33.5 g/dL (ref 31.5–36.0)
MCV: 87.6 fL (ref 79.5–101.0)
MONO#: 0.6 10*3/uL (ref 0.1–0.9)
MONO%: 10.2 % (ref 0.0–14.0)
NEUT#: 4.4 10*3/uL (ref 1.5–6.5)
NEUT%: 70.4 % (ref 38.4–76.8)
Platelets: 152 10*3/uL (ref 145–400)
RBC: 4.6 10*6/uL (ref 3.70–5.45)
RDW: 13.3 % (ref 11.2–14.5)
Retic %: 1.89 % (ref 0.70–2.10)
Retic Ct Abs: 86.94 10*3/uL (ref 33.70–90.70)
WBC: 6.2 10*3/uL (ref 3.9–10.3)
lymph#: 1.1 10*3/uL (ref 0.9–3.3)

## 2014-08-22 NOTE — Progress Notes (Signed)
[place] OFFICE PROGRESS NOTE  Patient Care Team: Lilian Coma, MD as PCP - General (Family Medicine)  SUMMARY OF ONCOLOGIC HISTORY:   CLL (chronic lymphoid leukemia) with failed remission   07/02/2010 Procedure LN biopsy confirmed CLL   08/18/2010 Procedure Bone marrow biopsy confirmed CLL   11/18/2010 - 04/15/2011 Chemotherapy patient received 6 cycles of FLudarabine and Rituximab   01/13/2012 Relapse/Recurrence Repeat BM biopsy confirmed relapse   01/27/2012 - 06/23/2012 Chemotherapy Bendamustine & Rituximab given, complicated by infusion reaction to Rituximab, completed 6 cycles   06/29/2013 Imaging Ct scan confirmed disease relapse with bulky lymphadenopathy   07/23/2013 Procedure Repeat BM biopsy due to thrombocytopenia and progressive luekocytosis   08/02/2013 Relapse/Recurrence Patient consented to start iburitinib as salvage therapy for relapsed CLL   11/09/2013 Imaging Repeat CT scan the chest, abdomen and pelvis showed greater than 50% response to treatment   06/17/2014 Imaging Repeat CT scan showed near complete response to treatment.    INTERVAL HISTORY: Please see below for problem oriented charting. She is seen as part of her review while on treatment for CLL. She complained of bruising of skin. Complained of mild TMJ pain.  REVIEW OF SYSTEMS:   Constitutional: Denies fevers, chills or abnormal weight loss Eyes: Denies blurriness of vision Ears, nose, mouth, throat, and face: Denies mucositis or sore throat Respiratory: Denies cough, dyspnea or wheezes Cardiovascular: Denies palpitation, chest discomfort or lower extremity swelling Gastrointestinal:  Denies nausea, heartburn or change in bowel habits Skin: Denies abnormal skin rashes Lymphatics: Denies new lymphadenopathy  Neurological:Denies numbness, tingling or new weaknesses Behavioral/Psych: Mood is stable, no new changes  All other systems were reviewed with the patient and are negative.  I have  reviewed the past medical history, past surgical history, social history and family history with the patient and they are unchanged from previous note.  ALLERGIES:  is allergic to azithromycin and ibuprofen.  MEDICATIONS:  Current Outpatient Prescriptions  Medication Sig Dispense Refill  . acyclovir (ZOVIRAX) 400 MG tablet Take 400 mg by mouth daily.    Marland Kitchen allopurinol (ZYLOPRIM) 300 MG tablet Take 300 mg by mouth every morning.    Marland Kitchen amLODipine (NORVASC) 10 MG tablet TAKE 1 TABLET (10 MG TOTAL) BY MOUTH DAILY. 90 tablet 1  . Carboxymethylcellul-Glycerin (REFRESH OPTIVE SENSITIVE OP) Place 1 drop into both eyes 2 (two) times daily.     . citalopram (CELEXA) 20 MG tablet Take 20 mg by mouth every morning.     . ibrutinib (IMBRUVICA) 140 MG capsul Take 3 capsules (420 mg total) by mouth daily. 90 capsule 3  . LECITHIN PO Take 4 tablets by mouth daily.     Marland Kitchen losartan-hydrochlorothiazide (HYZAAR) 100-12.5 MG per tablet Take 1 tablet by mouth every morning.     . magnesium oxide (MAG-OX) 400 MG tablet Take 750 mg by mouth daily.     Marland Kitchen OVER THE COUNTER MEDICATION Take 3 tablets by mouth daily. IMMUNE STIMULATOR    . OVER THE COUNTER MEDICATION Take 2 tablets by mouth daily. PROBIOTIC 11    . OVER THE COUNTER MEDICATION Take 1 Can by mouth every Monday, Wednesday, and Friday. WHEY PROTEIN DRINKS    . OVER THE COUNTER MEDICATION Take 15 mLs by mouth daily with lunch. Liquid Chlorophyll    . OVER THE COUNTER MEDICATION Take 2 tablets by mouth 2 (two) times daily. Cordycepts    . OVER THE COUNTER MEDICATION Take 2 tablets by mouth 2 (two) times daily. Skeletal Strength    .  OVER THE COUNTER MEDICATION Take 1 tablet by mouth daily. Stress relief    . Potassium 99 MG TABS Take 1 tablet by mouth 2 (two) times daily. 1 tablet in am and 2 at HS    . UNABLE TO FIND Take 1 tablet by mouth 4 (four) times daily. Protease Plus    . VITAMIN D, CHOLECALCIFEROL, PO Take 5,000 Units by mouth 2 (two) times daily at 10  AM and 5 PM. 5,000 units in am and 15,000 u in pm     No current facility-administered medications for this visit.    PHYSICAL EXAMINATION: ECOG PERFORMANCE STATUS: 0 - Asymptomatic  Filed Vitals:   08/22/14 1253  BP: 122/75  Pulse: 74  Temp:   Resp:    Filed Weights   08/22/14 1244  Weight: 263 lb 9.6 oz (119.568 kg)    GENERAL:alert, no distress and comfortable. She is morbidly obese SKIN: skin color, texture, turgor are normal, no rashes or significant lesions. Noted old skin bruising EYES: normal, Conjunctiva are pink and non-injected, sclera clear OROPHARYNX:no exudate, no erythema and lips, buccal mucosa, and tongue normal  NECK: supple, thyroid normal size, non-tender, without nodularity LYMPH:  no palpable lymphadenopathy in the cervical, axillary or inguinal LUNGS: clear to auscultation and percussion with normal breathing effort HEART: regular rate & rhythm and no murmurs and no lower extremity edema ABDOMEN:abdomen soft, non-tender and normal bowel sounds Musculoskeletal:no cyanosis of digits and no clubbing  NEURO: alert & oriented x 3 with fluent speech, no focal motor/sensory deficits  LABORATORY DATA:  I have reviewed the data as listed    Component Value Date/Time   NA 141 08/22/2014 1231   NA 141 05/19/2012 1036   K 4.1 08/22/2014 1231   K 4.3 05/19/2012 1036   CL 98 02/02/2013 1353   CL 102 05/19/2012 1036   CO2 31* 08/22/2014 1231   CO2 30 05/19/2012 1036   GLUCOSE 89 08/22/2014 1231   GLUCOSE 99 02/02/2013 1353   GLUCOSE 85 05/19/2012 1036   BUN 23.0 08/22/2014 1231   BUN 21 05/19/2012 1036   CREATININE 1.1 08/22/2014 1231   CREATININE 0.96 05/19/2012 1036   CALCIUM 9.8 08/22/2014 1231   CALCIUM 9.2 05/19/2012 1036   PROT 6.3* 08/22/2014 1231   PROT 5.7* 05/19/2012 1036   ALBUMIN 4.0 08/22/2014 1231   ALBUMIN 4.2 05/19/2012 1036   AST 17 08/22/2014 1231   AST 20 05/19/2012 1036   ALT 24 08/22/2014 1231   ALT 22 05/19/2012 1036   ALKPHOS  72 08/22/2014 1231   ALKPHOS 106 05/19/2012 1036   BILITOT 0.63 08/22/2014 1231   BILITOT 0.5 05/19/2012 1036   GFRNONAA 56* 08/31/2010 1429   GFRAA  08/31/2010 1429    >60        The eGFR has been calculated using the MDRD equation. This calculation has not been validated in all clinical situations. eGFR's persistently <60 mL/min signify possible Chronic Kidney Disease.    No results found for: SPEP, UPEP  Lab Results  Component Value Date   WBC 6.2 08/22/2014   NEUTROABS 4.4 08/22/2014   HGB 13.5 08/22/2014   HCT 40.3 08/22/2014   MCV 87.6 08/22/2014   PLT 152 08/22/2014      Chemistry      Component Value Date/Time   NA 141 08/22/2014 1231   NA 141 05/19/2012 1036   K 4.1 08/22/2014 1231   K 4.3 05/19/2012 1036   CL 98 02/02/2013 1353   CL  102 05/19/2012 1036   CO2 31* 08/22/2014 1231   CO2 30 05/19/2012 1036   BUN 23.0 08/22/2014 1231   BUN 21 05/19/2012 1036   CREATININE 1.1 08/22/2014 1231   CREATININE 0.96 05/19/2012 1036      Component Value Date/Time   CALCIUM 9.8 08/22/2014 1231   CALCIUM 9.2 05/19/2012 1036   ALKPHOS 72 08/22/2014 1231   ALKPHOS 106 05/19/2012 1036   AST 17 08/22/2014 1231   AST 20 05/19/2012 1036   ALT 24 08/22/2014 1231   ALT 22 05/19/2012 1036   BILITOT 0.63 08/22/2014 1231   BILITOT 0.5 05/19/2012 1036     ASSESSMENT & PLAN:  CLL (chronic lymphoid leukemia) with failed remission She had excellent response to treatment. CT scan showed near complete response. Her white blood cell count has normalized. I will continue current dosage of chemotherapy with the adjustment. I will see her back in 4 months for further assessment, history and physical examination.   Orders Placed This Encounter  Procedures  . CBC with Differential    Standing Status: Future     Number of Occurrences:      Standing Expiration Date: 09/26/2015  . Comprehensive metabolic panel    Standing Status: Future     Number of Occurrences:      Standing  Expiration Date: 09/26/2015  . Lactate dehydrogenase    Standing Status: Future     Number of Occurrences:      Standing Expiration Date: 09/26/2015   All questions were answered. The patient knows to call the clinic with any problems, questions or concerns. No barriers to learning was detected. I spent 15 minutes counseling the patient face to face. The total time spent in the appointment was 20 minutes and more than 50% was on counseling and review of test results     [facility], [place], MD 08/22/2014 8:12 PM

## 2014-08-22 NOTE — Assessment & Plan Note (Signed)
She had excellent response to treatment. CT scan showed near complete response. Her white blood cell count has normalized. I will continue current dosage of chemotherapy with the adjustment. I will see her back in 4 months for further assessment, history and physical examination.

## 2014-08-23 ENCOUNTER — Telehealth: Payer: Self-pay | Admitting: Hematology and Oncology

## 2014-08-23 NOTE — Telephone Encounter (Signed)
lvm for pt regarding to March 2016 appt....mailed pt appt sched/avs and letter

## 2014-09-09 ENCOUNTER — Other Ambulatory Visit: Payer: Self-pay | Admitting: *Deleted

## 2014-09-09 DIAGNOSIS — C9190 Lymphoid leukemia, unspecified not having achieved remission: Secondary | ICD-10-CM

## 2014-09-09 DIAGNOSIS — C9110 Chronic lymphocytic leukemia of B-cell type not having achieved remission: Secondary | ICD-10-CM

## 2014-09-09 MED ORDER — IBRUTINIB 140 MG PO CAPS: 420.0000 mg | ORAL_CAPSULE | Freq: Every day | ORAL | Status: DC

## 2014-09-09 NOTE — Telephone Encounter (Signed)
Refill request for Imbruvica forwarded to MD desk.

## 2014-09-10 NOTE — Telephone Encounter (Signed)
RECEIVED A FAX FROM [facility] CONCERNING A CONFIRMATION OF FACSIMILE RECEIPT FOR PT. REFERRAL. 

## 2014-09-19 NOTE — Telephone Encounter (Signed)
RECEIVED A FAX FROM [facility] CONCERNING A CONFIRMATION OF PRESCRIPTION SHIPMENT FOR [place] ON 09/17/14.

## 2014-10-24 ENCOUNTER — Other Ambulatory Visit: Payer: Medicare Other

## 2014-10-24 ENCOUNTER — Ambulatory Visit: Payer: Medicare Other | Admitting: Hematology and Oncology

## 2014-12-09 ENCOUNTER — Other Ambulatory Visit: Payer: Self-pay | Admitting: Hematology and Oncology

## 2014-12-19 ENCOUNTER — Telehealth: Payer: Self-pay | Admitting: Hematology and Oncology

## 2014-12-19 ENCOUNTER — Encounter: Payer: Self-pay | Admitting: Hematology and Oncology

## 2014-12-19 ENCOUNTER — Other Ambulatory Visit (HOSPITAL_BASED_OUTPATIENT_CLINIC_OR_DEPARTMENT_OTHER): Payer: Medicare Other

## 2014-12-19 ENCOUNTER — Ambulatory Visit (HOSPITAL_BASED_OUTPATIENT_CLINIC_OR_DEPARTMENT_OTHER): Payer: Medicare Other | Admitting: Hematology and Oncology

## 2014-12-19 VITALS — BP 137/57 | HR 69 | Temp 97.9°F | Resp 20 | Wt 261.0 lb

## 2014-12-19 DIAGNOSIS — C9190 Lymphoid leukemia, unspecified not having achieved remission: Secondary | ICD-10-CM

## 2014-12-19 DIAGNOSIS — C9110 Chronic lymphocytic leukemia of B-cell type not having achieved remission: Secondary | ICD-10-CM

## 2014-12-19 LAB — COMPREHENSIVE METABOLIC PANEL (CC13)
ALT: 13 U/L (ref 0–55)
AST: 15 U/L (ref 5–34)
Albumin: 4 g/dL (ref 3.5–5.0)
Alkaline Phosphatase: 71 U/L (ref 40–150)
Anion Gap: 9 mEq/L (ref 3–11)
BUN: 22.5 mg/dL (ref 7.0–26.0)
CO2: 31 mEq/L — ABNORMAL HIGH (ref 22–29)
Calcium: 9.9 mg/dL (ref 8.4–10.4)
Chloride: 102 mEq/L (ref 98–109)
Creatinine: 1 mg/dL (ref 0.6–1.1)
EGFR: 57 mL/min/{1.73_m2} — ABNORMAL LOW (ref >90)
Glucose: 98 mg/dl (ref 70–140)
Potassium: 4.1 mEq/L (ref 3.5–5.1)
Sodium: 142 mEq/L (ref 136–145)
Total Bilirubin: 0.67 mg/dL (ref 0.20–1.20)
Total Protein: 6.4 g/dL (ref 6.4–8.3)

## 2014-12-19 LAB — CBC WITH DIFFERENTIAL/PLATELET
BASO%: 0.5 % (ref 0.0–2.0)
Basophils Absolute: 0 10*3/uL (ref 0.0–0.1)
EOS%: 1.6 % (ref 0.0–7.0)
Eosinophils Absolute: 0.1 10*3/uL (ref 0.0–0.5)
HCT: 41.5 % (ref 34.8–46.6)
HGB: 14.2 g/dL (ref 11.6–15.9)
LYMPH%: 14.4 % (ref 14.0–49.7)
MCH: 30.3 pg (ref 25.1–34.0)
MCHC: 34.2 g/dL (ref 31.5–36.0)
MCV: 88.5 fL (ref 79.5–101.0)
MONO#: 0.6 10*3/uL (ref 0.1–0.9)
MONO%: 10.3 % (ref 0.0–14.0)
NEUT#: 4.5 10*3/uL (ref 1.5–6.5)
NEUT%: 73.2 % (ref 38.4–76.8)
Platelets: 177 10*3/uL (ref 145–400)
RBC: 4.69 10*6/uL (ref 3.70–5.45)
RDW: 13.3 % (ref 11.2–14.5)
WBC: 6.1 10*3/uL (ref 3.9–10.3)
lymph#: 0.9 10*3/uL (ref 0.9–3.3)

## 2014-12-19 LAB — TECHNOLOGIST REVIEW

## 2014-12-19 LAB — LACTATE DEHYDROGENASE (CC13): LDH: 177 U/L (ref 125–245)

## 2014-12-19 NOTE — Progress Notes (Signed)
[place] OFFICE PROGRESS NOTE  Patient Care Team: Lilian Coma, MD as PCP - General (Family Medicine)  SUMMARY OF ONCOLOGIC HISTORY:   CLL (chronic lymphoid leukemia) with failed remission   07/02/2010 Procedure LN biopsy confirmed CLL   08/18/2010 Procedure Bone marrow biopsy confirmed CLL   11/18/2010 - 04/15/2011 Chemotherapy patient received 6 cycles of FLudarabine and Rituximab   01/13/2012 Relapse/Recurrence Repeat BM biopsy confirmed relapse   01/27/2012 - 06/23/2012 Chemotherapy Bendamustine & Rituximab given, complicated by infusion reaction to Rituximab, completed 6 cycles   06/29/2013 Imaging Ct scan confirmed disease relapse with bulky lymphadenopathy   07/23/2013 Procedure Repeat BM biopsy due to thrombocytopenia and progressive luekocytosis   08/02/2013 Relapse/Recurrence Patient consented to start iburitinib as salvage therapy for relapsed CLL   11/09/2013 Imaging Repeat CT scan the chest, abdomen and pelvis showed greater than 50% response to treatment   06/17/2014 Imaging Repeat CT scan showed near complete response to treatment.    INTERVAL HISTORY: Please see below for problem oriented charting.  she is seen today to review toxicity. She is doing well. Denies recent infection. No new lymphadenopathy.  REVIEW OF SYSTEMS:   Constitutional: Denies fevers, chills or abnormal weight loss Eyes: Denies blurriness of vision Ears, nose, mouth, throat, and face: Denies mucositis or sore throat Respiratory: Denies cough, dyspnea or wheezes Cardiovascular: Denies palpitation, chest discomfort or lower extremity swelling Gastrointestinal:  Denies nausea, heartburn or change in bowel habits Skin: Denies abnormal skin rashes Lymphatics: Denies new lymphadenopathy  Neurological:Denies numbness, tingling or new weaknesses Behavioral/Psych: Mood is stable, no new changes  All other systems were reviewed with the patient and are negative.  I have reviewed the past  medical history, past surgical history, social history and family history with the patient and they are unchanged from previous note.  ALLERGIES:  is allergic to azithromycin and ibuprofen.  MEDICATIONS:  Current Outpatient Prescriptions  Medication Sig Dispense Refill  . acyclovir (ZOVIRAX) 400 MG tablet Take 400 mg by mouth daily.    Marland Kitchen allopurinol (ZYLOPRIM) 300 MG tablet Take 300 mg by mouth every morning.    Marland Kitchen amLODipine (NORVASC) 10 MG tablet TAKE 1 TABLET (10 MG TOTAL) BY MOUTH DAILY. 90 tablet 1  . Carboxymethylcellul-Glycerin (REFRESH OPTIVE SENSITIVE OP) Place 1 drop into both eyes 2 (two) times daily.     . citalopram (CELEXA) 20 MG tablet Take 20 mg by mouth every morning.     . ibrutinib (IMBRUVICA) 140 MG capsul Take 3 capsules (420 mg total) by mouth daily. 90 capsule 3  . LECITHIN PO Take 4 tablets by mouth daily.     Marland Kitchen losartan-hydrochlorothiazide (HYZAAR) 100-12.5 MG per tablet Take 1 tablet by mouth every morning.     . magnesium oxide (MAG-OX) 400 MG tablet Take 750 mg by mouth daily.     Marland Kitchen OVER THE COUNTER MEDICATION Take 3 tablets by mouth daily. IMMUNE STIMULATOR    . OVER THE COUNTER MEDICATION Take 2 tablets by mouth daily. PROBIOTIC 11    . OVER THE COUNTER MEDICATION Take 1 Can by mouth every Monday, Wednesday, and Friday. WHEY PROTEIN DRINKS    . OVER THE COUNTER MEDICATION Take 15 mLs by mouth daily with lunch. Liquid Chlorophyll    . OVER THE COUNTER MEDICATION Take 2 tablets by mouth 2 (two) times daily. Cordycepts    . OVER THE COUNTER MEDICATION Take 2 tablets by mouth 2 (two) times daily. Skeletal Strength    . OVER THE COUNTER  MEDICATION Take 1 tablet by mouth daily. Stress relief    . Potassium 99 MG TABS Take 1 tablet by mouth 2 (two) times daily. 1 tablet in am and 2 at HS    . UNABLE TO FIND Take 1 tablet by mouth 4 (four) times daily. Protease Plus    . VITAMIN D, CHOLECALCIFEROL, PO Take 5,000 Units by mouth 2 (two) times daily at 10 AM and 5 PM. 5,000  units in am and 15,000 u in pm     No current facility-administered medications for this visit.    PHYSICAL EXAMINATION: ECOG PERFORMANCE STATUS: 0 - Asymptomatic  Filed Vitals:   12/19/14 0954  BP: 137/57  Pulse: 69  Temp:   Resp:    Filed Weights   12/19/14 0948  Weight: 261 lb (118.389 kg)    GENERAL:alert, no distress and comfortable. She is morbidly obese SKIN: skin color, texture, turgor are normal, no rashes or significant lesions EYES: normal, Conjunctiva are pink and non-injected, sclera clear OROPHARYNX:no exudate, no erythema and lips, buccal mucosa, and tongue normal  NECK: supple, thyroid normal size, non-tender, without nodularity LYMPH:  no palpable lymphadenopathy in the cervical, axillary or inguinal LUNGS: clear to auscultation and percussion with normal breathing effort HEART: regular rate & rhythm and no murmurs and no lower extremity edema ABDOMEN:abdomen soft, non-tender and normal bowel sounds Musculoskeletal:no cyanosis of digits and no clubbing  NEURO: alert & oriented x 3 with fluent speech, no focal motor/sensory deficits  LABORATORY DATA:  I have reviewed the data as listed    Component Value Date/Time   NA 142 12/19/2014 0936   NA 141 05/19/2012 1036   K 4.1 12/19/2014 0936   K 4.3 05/19/2012 1036   CL 98 02/02/2013 1353   CL 102 05/19/2012 1036   CO2 31* 12/19/2014 0936   CO2 30 05/19/2012 1036   GLUCOSE 98 12/19/2014 0936   GLUCOSE 99 02/02/2013 1353   GLUCOSE 85 05/19/2012 1036   BUN 22.5 12/19/2014 0936   BUN 21 05/19/2012 1036   CREATININE 1.0 12/19/2014 0936   CREATININE 0.96 05/19/2012 1036   CALCIUM 9.9 12/19/2014 0936   CALCIUM 9.2 05/19/2012 1036   PROT 6.4 12/19/2014 0936   PROT 5.7* 05/19/2012 1036   ALBUMIN 4.0 12/19/2014 0936   ALBUMIN 4.2 05/19/2012 1036   AST 15 12/19/2014 0936   AST 20 05/19/2012 1036   ALT 13 12/19/2014 0936   ALT 22 05/19/2012 1036   ALKPHOS 71 12/19/2014 0936   ALKPHOS 106 05/19/2012 1036    BILITOT 0.67 12/19/2014 0936   BILITOT 0.5 05/19/2012 1036   GFRNONAA 56* 08/31/2010 1429   GFRAA  08/31/2010 1429    >60        The eGFR has been calculated using the MDRD equation. This calculation has not been validated in all clinical situations. eGFR's persistently <60 mL/min signify possible Chronic Kidney Disease.    No results found for: SPEP, UPEP  Lab Results  Component Value Date   WBC 6.1 12/19/2014   NEUTROABS 4.5 12/19/2014   HGB 14.2 12/19/2014   HCT 41.5 12/19/2014   MCV 88.5 12/19/2014   PLT 177 12/19/2014      Chemistry      Component Value Date/Time   NA 142 12/19/2014 0936   NA 141 05/19/2012 1036   K 4.1 12/19/2014 0936   K 4.3 05/19/2012 1036   CL 98 02/02/2013 1353   CL 102 05/19/2012 1036   CO2 31* 12/19/2014 6962  CO2 30 05/19/2012 1036   BUN 22.5 12/19/2014 0936   BUN 21 05/19/2012 1036   CREATININE 1.0 12/19/2014 0936   CREATININE 0.96 05/19/2012 1036      Component Value Date/Time   CALCIUM 9.9 12/19/2014 0936   CALCIUM 9.2 05/19/2012 1036   ALKPHOS 71 12/19/2014 0936   ALKPHOS 106 05/19/2012 1036   AST 15 12/19/2014 0936   AST 20 05/19/2012 1036   ALT 13 12/19/2014 0936   ALT 22 05/19/2012 1036   BILITOT 0.67 12/19/2014 0936   BILITOT 0.5 05/19/2012 1036      ASSESSMENT & PLAN:  CLL (chronic lymphoid leukemia) with failed remission She had excellent response to treatment. Last CT scan showed near complete response. Her white blood cell count has normalized. I will continue current dosage of chemotherapy with the adjustment. I will see her back in 5 months for further assessment,  with blood work, imaging study,history and physical examination.    Orders Placed This Encounter  Procedures  . CT Chest W Contrast    Standing Status: Future     Number of Occurrences:      Standing Expiration Date: 03/20/2016    Order Specific Question:  Reason for Exam (SYMPTOM  OR DIAGNOSIS REQUIRED)    Answer:  staging CLL, assess  repsonse to Rx    Order Specific Question:  Preferred imaging location?    Answer:  [facility]  . CT Abdomen Pelvis W Contrast    Standing Status: Future     Number of Occurrences:      Standing Expiration Date: 03/20/2016    Order Specific Question:  Reason for Exam (SYMPTOM  OR DIAGNOSIS REQUIRED)    Answer:  CLLstaging, assess response to Rx    Order Specific Question:  Preferred imaging location?    Answer:  [facility]  . Comprehensive metabolic panel    Standing Status: Future     Number of Occurrences:      Standing Expiration Date: 03/20/2016  . CBC with Differential/Platelet    Standing Status: Future     Number of Occurrences:      Standing Expiration Date: 03/20/2016  . Lactate dehydrogenase    Standing Status: Future     Number of Occurrences:      Standing Expiration Date: 03/20/2016   All questions were answered. The patient knows to call the clinic with any problems, questions or concerns. No barriers to learning was detected. I spent 15 minutes counseling the patient face to face. The total time spent in the appointment was 20 minutes and more than 50% was on counseling and review of test results     [facility], [place], MD 12/19/2014 10:36 AM

## 2014-12-19 NOTE — Telephone Encounter (Signed)
S/w pt confirming labs/ov per 03/03 POF, advised pt and caregiver someone would need to come p/u her contrast for her CT scans that she is having done the same day as labs, mailed schedule to pt.... KJ

## 2014-12-19 NOTE — Assessment & Plan Note (Addendum)
She had excellent response to treatment. Last CT scan showed near complete response. Her white blood cell count has normalized. I will continue current dosage of chemotherapy with the adjustment. I will see her back in 5 months for further assessment,  with blood work, imaging study,history and physical examination.

## 2014-12-20 NOTE — Telephone Encounter (Signed)
Received a fax from [employer] concerning confirmation of shipment for patient's Imbruvica - ship date 12-19-14

## 2015-01-14 ENCOUNTER — Other Ambulatory Visit: Payer: Self-pay | Admitting: *Deleted

## 2015-01-14 DIAGNOSIS — C9110 Chronic lymphocytic leukemia of B-cell type not having achieved remission: Secondary | ICD-10-CM

## 2015-01-14 DIAGNOSIS — C9190 Lymphoid leukemia, unspecified not having achieved remission: Secondary | ICD-10-CM

## 2015-01-14 MED ORDER — IBRUTINIB 140 MG PO CAPS: 420.0000 mg | ORAL_CAPSULE | Freq: Every day | ORAL | Status: DC

## 2015-02-20 ENCOUNTER — Other Ambulatory Visit: Payer: Self-pay | Admitting: Family Medicine

## 2015-02-20 DIAGNOSIS — W5501XA Bitten by cat, initial encounter: Secondary | ICD-10-CM

## 2015-02-26 ENCOUNTER — Telehealth: Payer: Self-pay | Admitting: Hematology and Oncology

## 2015-02-26 NOTE — Telephone Encounter (Signed)
lvm for pt poa and advised to call radiology to r/s appt then call us back to r/s lab and MD around ct.

## 2015-02-27 ENCOUNTER — Telehealth: Payer: Self-pay | Admitting: Hematology and Oncology

## 2015-02-27 NOTE — Telephone Encounter (Signed)
pt called to r/s appt...done...ok adn aware

## 2015-03-09 ENCOUNTER — Ambulatory Visit
Admission: RE | Admit: 2015-03-09 | Discharge: 2015-03-09 | Disposition: A | Discharge status: Home / Self Care | Payer: Medicare Other | Source: Ambulatory Visit | Attending: Family Medicine | Admitting: Family Medicine

## 2015-03-09 DIAGNOSIS — W5501XA Bitten by cat, initial encounter: Secondary | ICD-10-CM

## 2015-03-10 ENCOUNTER — Other Ambulatory Visit: Payer: Self-pay

## 2015-03-10 DIAGNOSIS — Z1231 Encounter for screening mammogram for malignant neoplasm of breast: Secondary | ICD-10-CM

## 2015-03-24 ENCOUNTER — Ambulatory Visit
Admission: RE | Admit: 2015-03-24 | Discharge: 2015-03-24 | Disposition: A | Discharge status: Home / Self Care | Payer: Medicare Other | Source: Ambulatory Visit

## 2015-03-24 DIAGNOSIS — Z1231 Encounter for screening mammogram for malignant neoplasm of breast: Secondary | ICD-10-CM

## 2015-04-23 ENCOUNTER — Ambulatory Visit: Payer: Medicare Other

## 2015-05-08 ENCOUNTER — Telehealth: Payer: Self-pay | Admitting: *Deleted

## 2015-05-08 NOTE — Telephone Encounter (Signed)
VM from Dr. Stephanie Acre informing they are giving pt Prevnar 13 today.   I noted it in our EMR.

## 2015-05-21 ENCOUNTER — Ambulatory Visit (HOSPITAL_COMMUNITY): Payer: Medicare Other

## 2015-05-21 ENCOUNTER — Other Ambulatory Visit: Payer: Medicare Other

## 2015-05-22 ENCOUNTER — Ambulatory Visit: Payer: Medicare Other | Admitting: Hematology and Oncology

## 2015-05-26 ENCOUNTER — Telehealth: Payer: Self-pay | Admitting: *Deleted

## 2015-05-26 NOTE — Telephone Encounter (Signed)
[facility] sent facsimile confirmation of Imbruvica prescription shipment.  Imbruvica was shipped on 05-23-2015 with next business day delivery.

## 2015-05-27 ENCOUNTER — Telehealth: Payer: Self-pay | Admitting: *Deleted

## 2015-05-27 NOTE — Telephone Encounter (Signed)
Received VM message from pt's niece-Tracy Lurey, regarding upcoming labs and CT Scan.  Returned call to her. She just wanted to review when to pick up contrast for CT Scan and timing of labs etc. She was also inquiring as to how long pt needed to be NPO. Reviewed all this with her. She voiced understanding. No other needs identified.

## 2015-05-30 ENCOUNTER — Ambulatory Visit (HOSPITAL_COMMUNITY)
Admission: RE | Admit: 2015-05-30 | Discharge: 2015-05-30 | Disposition: A | Discharge status: Home / Self Care | Payer: Medicare Other | Source: Ambulatory Visit | Attending: Hematology and Oncology | Admitting: Hematology and Oncology

## 2015-05-30 ENCOUNTER — Encounter (HOSPITAL_COMMUNITY): Payer: Self-pay

## 2015-05-30 ENCOUNTER — Other Ambulatory Visit (HOSPITAL_BASED_OUTPATIENT_CLINIC_OR_DEPARTMENT_OTHER): Payer: Medicare Other

## 2015-05-30 DIAGNOSIS — Z08 Encounter for follow-up examination after completed treatment for malignant neoplasm: Secondary | ICD-10-CM | POA: Insufficient documentation

## 2015-05-30 DIAGNOSIS — C9110 Chronic lymphocytic leukemia of B-cell type not having achieved remission: Secondary | ICD-10-CM

## 2015-05-30 DIAGNOSIS — I7090 Unspecified atherosclerosis: Secondary | ICD-10-CM | POA: Diagnosis not present

## 2015-05-30 DIAGNOSIS — R161 Splenomegaly, not elsewhere classified: Secondary | ICD-10-CM | POA: Diagnosis not present

## 2015-05-30 DIAGNOSIS — C9190 Lymphoid leukemia, unspecified not having achieved remission: Secondary | ICD-10-CM

## 2015-05-30 DIAGNOSIS — R160 Hepatomegaly, not elsewhere classified: Secondary | ICD-10-CM | POA: Insufficient documentation

## 2015-05-30 DIAGNOSIS — R590 Localized enlarged lymph nodes: Secondary | ICD-10-CM | POA: Diagnosis not present

## 2015-05-30 LAB — CBC WITH DIFFERENTIAL/PLATELET
BASO%: 1.2 % (ref 0.0–2.0)
Basophils Absolute: 0.1 10*3/uL (ref 0.0–0.1)
EOS%: 1.2 % (ref 0.0–7.0)
Eosinophils Absolute: 0.1 10*3/uL (ref 0.0–0.5)
HCT: 40.9 % (ref 34.8–46.6)
HGB: 13.7 g/dL (ref 11.6–15.9)
LYMPH%: 11.4 % — ABNORMAL LOW (ref 14.0–49.7)
MCH: 29.4 pg (ref 25.1–34.0)
MCHC: 33.4 g/dL (ref 31.5–36.0)
MCV: 88 fL (ref 79.5–101.0)
MONO#: 0.5 10*3/uL (ref 0.1–0.9)
MONO%: 7.6 % (ref 0.0–14.0)
NEUT#: 5.5 10*3/uL (ref 1.5–6.5)
NEUT%: 78.6 % — ABNORMAL HIGH (ref 38.4–76.8)
Platelets: 148 10*3/uL (ref 145–400)
RBC: 4.65 10*6/uL (ref 3.70–5.45)
RDW: 14.5 % (ref 11.2–14.5)
WBC: 7 10*3/uL (ref 3.9–10.3)
lymph#: 0.8 10*3/uL — ABNORMAL LOW (ref 0.9–3.3)

## 2015-05-30 LAB — COMPREHENSIVE METABOLIC PANEL (CC13)
ALT: 13 U/L (ref 0–55)
AST: 12 U/L (ref 5–34)
Albumin: 3.8 g/dL (ref 3.5–5.0)
Alkaline Phosphatase: 70 U/L (ref 40–150)
Anion Gap: 10 mEq/L (ref 3–11)
BUN: 17.6 mg/dL (ref 7.0–26.0)
CO2: 28 mEq/L (ref 22–29)
Calcium: 9.4 mg/dL (ref 8.4–10.4)
Chloride: 102 mEq/L (ref 98–109)
Creatinine: 1.1 mg/dL (ref 0.6–1.1)
EGFR: 50 mL/min/{1.73_m2} — ABNORMAL LOW (ref >90)
Glucose: 87 mg/dl (ref 70–140)
Potassium: 4 mEq/L (ref 3.5–5.1)
Sodium: 140 mEq/L (ref 136–145)
Total Bilirubin: 0.68 mg/dL (ref 0.20–1.20)
Total Protein: 6.2 g/dL — ABNORMAL LOW (ref 6.4–8.3)

## 2015-05-30 LAB — TECHNOLOGIST REVIEW

## 2015-05-30 LAB — LACTATE DEHYDROGENASE (CC13): LDH: 155 U/L (ref 125–245)

## 2015-05-30 MED ORDER — IOHEXOL 300 MG/ML  SOLN
100.0000 mL | Freq: Once | INTRAMUSCULAR | Status: AC | PRN
  Administered 2015-05-30: 100 mL via INTRAVENOUS

## 2015-06-02 ENCOUNTER — Ambulatory Visit (HOSPITAL_BASED_OUTPATIENT_CLINIC_OR_DEPARTMENT_OTHER): Payer: Medicare Other | Admitting: Hematology and Oncology

## 2015-06-02 ENCOUNTER — Encounter: Payer: Self-pay | Admitting: Hematology and Oncology

## 2015-06-02 ENCOUNTER — Telehealth: Payer: Self-pay | Admitting: Hematology and Oncology

## 2015-06-02 VITALS — BP 146/75 | HR 76 | Temp 98.3°F | Resp 18 | Ht 63.0 in | Wt 258.4 lb

## 2015-06-02 DIAGNOSIS — C9110 Chronic lymphocytic leukemia of B-cell type not having achieved remission: Secondary | ICD-10-CM

## 2015-06-02 HISTORY — DX: Chronic lymphocytic leukemia of B-cell type not having achieved remission: C91.10

## 2015-06-02 NOTE — Assessment & Plan Note (Signed)
She had excellent response to treatment. Last CT scan showed complete response. Her white blood cell count has normalized. I will continue current dosage of chemotherapy with the adjustment.  I will see her back in 6 months for further assessment, with blood work, history and physical examination.  I will defer imaging study in one year.

## 2015-06-02 NOTE — Telephone Encounter (Signed)
Appointments made and a calendar has been mailed to the patient °

## 2015-06-02 NOTE — Progress Notes (Signed)
[place] OFFICE PROGRESS NOTE  Patient Care Team: Jonathon Jordan, MD as PCP - General (Family Medicine)  SUMMARY OF ONCOLOGIC HISTORY:   CLL (chronic lymphoid leukemia) with failed remission   07/02/2010 Procedure LN biopsy confirmed CLL   08/18/2010 Procedure Bone marrow biopsy confirmed CLL   11/18/2010 - 04/15/2011 Chemotherapy patient received 6 cycles of FLudarabine and Rituximab   01/13/2012 Relapse/Recurrence Repeat BM biopsy confirmed relapse   01/27/2012 - 06/23/2012 Chemotherapy Bendamustine & Rituximab given, complicated by infusion reaction to Rituximab, completed 6 cycles   06/29/2013 Imaging Ct scan confirmed disease relapse with bulky lymphadenopathy   07/23/2013 Procedure Repeat BM biopsy due to thrombocytopenia and progressive luekocytosis   08/02/2013 Relapse/Recurrence Patient consented to start iburitinib as salvage therapy for relapsed CLL   11/09/2013 Imaging Repeat CT scan the chest, abdomen and pelvis showed greater than 50% response to treatment   06/17/2014 Imaging Repeat CT scan showed near complete response to treatment.   05/30/2015 Imaging Repeat CT scan showed near complete response to treatment.    INTERVAL HISTORY: Please see below for problem oriented charting. She returns for further follow-up. Her niece is present. The patient has been doing well. Denies recent infection. She is compliant taking all her medications as prescribed.  REVIEW OF SYSTEMS:   Constitutional: Denies fevers, chills or abnormal weight loss Eyes: Denies blurriness of vision Ears, nose, mouth, throat, and face: Denies mucositis or sore throat Respiratory: Denies cough, dyspnea or wheezes Cardiovascular: Denies palpitation, chest discomfort or lower extremity swelling Gastrointestinal:  Denies nausea, heartburn or change in bowel habits Skin: Denies abnormal skin rashes Lymphatics: Denies new lymphadenopathy or easy bruising Neurological:Denies numbness, tingling or new  weaknesses Behavioral/Psych: Mood is stable, no new changes  All other systems were reviewed with the patient and are negative.  I have reviewed the past medical history, past surgical history, social history and family history with the patient and they are unchanged from previous note.  ALLERGIES:  is allergic to azithromycin and ibuprofen.  MEDICATIONS:  Current Outpatient Prescriptions  Medication Sig Dispense Refill  . acyclovir (ZOVIRAX) 400 MG tablet Take 400 mg by mouth daily.    Marland Kitchen allopurinol (ZYLOPRIM) 300 MG tablet Take 300 mg by mouth every morning.    Marland Kitchen amLODipine (NORVASC) 10 MG tablet TAKE 1 TABLET (10 MG TOTAL) BY MOUTH DAILY. 90 tablet 1  . Carboxymethylcellul-Glycerin (REFRESH OPTIVE SENSITIVE OP) Place 1 drop into both eyes 2 (two) times daily.     . citalopram (CELEXA) 20 MG tablet Take 20 mg by mouth every morning.     . ibrutinib (IMBRUVICA) 140 MG capsul Take 3 capsules (420 mg total) by mouth daily. 90 capsule 11  . losartan-hydrochlorothiazide (HYZAAR) 100-12.5 MG per tablet Take 1 tablet by mouth every morning.      No current facility-administered medications for this visit.    PHYSICAL EXAMINATION: ECOG PERFORMANCE STATUS: 0 - Asymptomatic  Filed Vitals:   06/02/15 0943  BP: 146/75  Pulse: 76  Temp: 98.3 F (36.8 C)  Resp: 18   Filed Weights   06/02/15 0943  Weight: 258 lb 6.4 oz (117.209 kg)    GENERAL:alert, no distress and comfortable SKIN: skin color, texture, turgor are normal, no rashes or significant lesions EYES: normal, Conjunctiva are pink and non-injected, sclera clear OROPHARYNX:no exudate, no erythema and lips, buccal mucosa, and tongue normal  NECK: supple, thyroid normal size, non-tender, without nodularity LYMPH:  no palpable lymphadenopathy in the cervical, axillary or inguinal LUNGS: clear  to auscultation and percussion with normal breathing effort HEART: regular rate & rhythm and no murmurs and no lower extremity  edema ABDOMEN:abdomen soft, non-tender and normal bowel sounds Musculoskeletal:no cyanosis of digits and no clubbing  NEURO: alert & oriented x 3 with fluent speech, no focal motor/sensory deficits  LABORATORY DATA:  I have reviewed the data as listed    Component Value Date/Time   NA 140 05/30/2015 0959   NA 141 05/19/2012 1036   K 4.0 05/30/2015 0959   K 4.3 05/19/2012 1036   CL 98 02/02/2013 1353   CL 102 05/19/2012 1036   CO2 28 05/30/2015 0959   CO2 30 05/19/2012 1036   GLUCOSE 87 05/30/2015 0959   GLUCOSE 99 02/02/2013 1353   GLUCOSE 85 05/19/2012 1036   BUN 17.6 05/30/2015 0959   BUN 21 05/19/2012 1036   CREATININE 1.1 05/30/2015 0959   CREATININE 0.96 05/19/2012 1036   CALCIUM 9.4 05/30/2015 0959   CALCIUM 9.2 05/19/2012 1036   PROT 6.2* 05/30/2015 0959   PROT 5.7* 05/19/2012 1036   ALBUMIN 3.8 05/30/2015 0959   ALBUMIN 4.2 05/19/2012 1036   AST 12 05/30/2015 0959   AST 20 05/19/2012 1036   ALT 13 05/30/2015 0959   ALT 22 05/19/2012 1036   ALKPHOS 70 05/30/2015 0959   ALKPHOS 106 05/19/2012 1036   BILITOT 0.68 05/30/2015 0959   BILITOT 0.5 05/19/2012 1036   GFRNONAA 56* 08/31/2010 1429   GFRAA  08/31/2010 1429    >60        The eGFR has been calculated using the MDRD equation. This calculation has not been validated in all clinical situations. eGFR's persistently <60 mL/min signify possible Chronic Kidney Disease.    No results found for: SPEP, UPEP  Lab Results  Component Value Date   WBC 7.0 05/30/2015   NEUTROABS 5.5 05/30/2015   HGB 13.7 05/30/2015   HCT 40.9 05/30/2015   MCV 88.0 05/30/2015   PLT 148 05/30/2015      Chemistry      Component Value Date/Time   NA 140 05/30/2015 0959   NA 141 05/19/2012 1036   K 4.0 05/30/2015 0959   K 4.3 05/19/2012 1036   CL 98 02/02/2013 1353   CL 102 05/19/2012 1036   CO2 28 05/30/2015 0959   CO2 30 05/19/2012 1036   BUN 17.6 05/30/2015 0959   BUN 21 05/19/2012 1036   CREATININE 1.1 05/30/2015  0959   CREATININE 0.96 05/19/2012 1036      Component Value Date/Time   CALCIUM 9.4 05/30/2015 0959   CALCIUM 9.2 05/19/2012 1036   ALKPHOS 70 05/30/2015 0959   ALKPHOS 106 05/19/2012 1036   AST 12 05/30/2015 0959   AST 20 05/19/2012 1036   ALT 13 05/30/2015 0959   ALT 22 05/19/2012 1036   BILITOT 0.68 05/30/2015 0959   BILITOT 0.5 05/19/2012 1036       RADIOGRAPHIC STUDIES: review of imaging study with her and her niece I have personally reviewed the radiological images as listed and agreed with the findings in the report.   ASSESSMENT & PLAN:  CLL (chronic lymphocytic leukemia) She had excellent response to treatment. Last CT scan showed complete response. Her white blood cell count has normalized. I will continue current dosage of chemotherapy with the adjustment.  I will see her back in 6 months for further assessment, with blood work, history and physical examination.  I will defer imaging study in one year.     Orders Placed  This Encounter  Procedures  . CBC with Differential/Platelet    Standing Status: Future     Number of Occurrences:      Standing Expiration Date: 07/06/2016  . Comprehensive metabolic panel    Standing Status: Future     Number of Occurrences:      Standing Expiration Date: 07/06/2016  . Lactate dehydrogenase    Standing Status: Future     Number of Occurrences:      Standing Expiration Date: 07/06/2016   All questions were answered. The patient knows to call the clinic with any problems, questions or concerns. No barriers to learning was detected. I spent 15 minutes counseling the patient face to face. The total time spent in the appointment was 20 minutes and more than 50% was on counseling and review of test results     [facility], [place], MD 06/02/2015 3:15 PM

## 2015-07-22 ENCOUNTER — Encounter: Payer: Self-pay | Admitting: Hematology and Oncology

## 2015-07-22 NOTE — Progress Notes (Signed)
Per [facility] imbruvica was shipped via [employer] °

## 2015-08-21 ENCOUNTER — Encounter: Payer: Self-pay | Admitting: Hematology and Oncology

## 2015-08-21 NOTE — Progress Notes (Signed)
Per [facility] imbruvica was shipped via [employer]

## 2015-11-20 ENCOUNTER — Encounter: Payer: Self-pay | Admitting: Hematology and Oncology

## 2015-11-20 NOTE — Progress Notes (Signed)
[facility] imbruvica was shipped 11/19/15

## 2015-11-24 DIAGNOSIS — H04123 Dry eye syndrome of bilateral lacrimal glands: Secondary | ICD-10-CM | POA: Diagnosis not present

## 2015-11-24 DIAGNOSIS — D3131 Benign neoplasm of right choroid: Secondary | ICD-10-CM | POA: Diagnosis not present

## 2015-11-25 DIAGNOSIS — J329 Chronic sinusitis, unspecified: Secondary | ICD-10-CM | POA: Diagnosis not present

## 2015-11-25 DIAGNOSIS — Z111 Encounter for screening for respiratory tuberculosis: Secondary | ICD-10-CM | POA: Diagnosis not present

## 2015-12-15 ENCOUNTER — Other Ambulatory Visit (HOSPITAL_BASED_OUTPATIENT_CLINIC_OR_DEPARTMENT_OTHER): Payer: Medicare Other

## 2015-12-15 ENCOUNTER — Ambulatory Visit (HOSPITAL_BASED_OUTPATIENT_CLINIC_OR_DEPARTMENT_OTHER): Payer: Medicare Other | Admitting: Hematology and Oncology

## 2015-12-15 ENCOUNTER — Encounter: Payer: Self-pay | Admitting: Hematology and Oncology

## 2015-12-15 VITALS — BP 150/75 | HR 81 | Temp 98.1°F | Resp 18 | Ht 63.0 in | Wt 269.6 lb

## 2015-12-15 DIAGNOSIS — C9110 Chronic lymphocytic leukemia of B-cell type not having achieved remission: Secondary | ICD-10-CM | POA: Diagnosis not present

## 2015-12-15 DIAGNOSIS — W19XXXA Unspecified fall, initial encounter: Secondary | ICD-10-CM | POA: Diagnosis not present

## 2015-12-15 DIAGNOSIS — I10 Essential (primary) hypertension: Secondary | ICD-10-CM | POA: Diagnosis not present

## 2015-12-15 DIAGNOSIS — M62830 Muscle spasm of back: Secondary | ICD-10-CM | POA: Diagnosis not present

## 2015-12-15 LAB — CBC WITH DIFFERENTIAL/PLATELET
BASO%: 1.1 % (ref 0.0–2.0)
Basophils Absolute: 0.1 10*3/uL (ref 0.0–0.1)
EOS%: 1.7 % (ref 0.0–7.0)
Eosinophils Absolute: 0.1 10*3/uL (ref 0.0–0.5)
HCT: 39.6 % (ref 34.8–46.6)
HGB: 13.1 g/dL (ref 11.6–15.9)
LYMPH%: 10.7 % — ABNORMAL LOW (ref 14.0–49.7)
MCH: 28.7 pg (ref 25.1–34.0)
MCHC: 33 g/dL (ref 31.5–36.0)
MCV: 87.1 fL (ref 79.5–101.0)
MONO#: 0.6 10*3/uL (ref 0.1–0.9)
MONO%: 8 % (ref 0.0–14.0)
NEUT#: 5.8 10*3/uL (ref 1.5–6.5)
NEUT%: 78.5 % — ABNORMAL HIGH (ref 38.4–76.8)
Platelets: 147 10*3/uL (ref 145–400)
RBC: 4.55 10*6/uL (ref 3.70–5.45)
RDW: 15.2 % — ABNORMAL HIGH (ref 11.2–14.5)
WBC: 7.5 10*3/uL (ref 3.9–10.3)
lymph#: 0.8 10*3/uL — ABNORMAL LOW (ref 0.9–3.3)

## 2015-12-15 LAB — COMPREHENSIVE METABOLIC PANEL
ALT: 21 U/L (ref 0–55)
AST: 14 U/L (ref 5–34)
Albumin: 3.8 g/dL (ref 3.5–5.0)
Alkaline Phosphatase: 69 U/L (ref 40–150)
Anion Gap: 9 mEq/L (ref 3–11)
BUN: 21.2 mg/dL (ref 7.0–26.0)
CO2: 29 mEq/L (ref 22–29)
Calcium: 9.5 mg/dL (ref 8.4–10.4)
Chloride: 103 mEq/L (ref 98–109)
Creatinine: 1.2 mg/dL — ABNORMAL HIGH (ref 0.6–1.1)
EGFR: 46 mL/min/{1.73_m2} — ABNORMAL LOW (ref >90)
Glucose: 99 mg/dl (ref 70–140)
Potassium: 3.8 mEq/L (ref 3.5–5.1)
Sodium: 141 mEq/L (ref 136–145)
Total Bilirubin: 0.47 mg/dL (ref 0.20–1.20)
Total Protein: 6.4 g/dL (ref 6.4–8.3)

## 2015-12-15 LAB — LACTATE DEHYDROGENASE: LDH: 171 U/L (ref 125–245)

## 2015-12-15 LAB — TECHNOLOGIST REVIEW

## 2015-12-16 ENCOUNTER — Telehealth: Payer: Self-pay | Admitting: Hematology and Oncology

## 2015-12-16 NOTE — Assessment & Plan Note (Signed)
She is not symptomatic. she will continue current medical management. I recommend close follow-up with primary care doctor for medication adjustment.

## 2015-12-16 NOTE — Assessment & Plan Note (Signed)
She had excellent response to treatment. Last CT scan showed complete response. Her white blood cell count has normalized. I will continue current dosage of chemotherapy with the adjustment.  I will see her back in 6 months for further assessment, with blood work, history, imaging study and physical examination.

## 2015-12-16 NOTE — Telephone Encounter (Signed)
per p[facility] to sch pt appt-cld & spoke to neice tracy and adv of appt time & date for 8/28 & 8/29-adv to come get contrast prior to scan and [facility] will call to [facility] that

## 2015-12-16 NOTE — Progress Notes (Signed)
[place] OFFICE PROGRESS NOTE  Patient Care Team: Jonathon Jordan, MD as PCP - General (Family Medicine)  SUMMARY OF ONCOLOGIC HISTORY:   CLL (chronic lymphocytic leukemia) ([place])   07/02/2010 Procedure LN biopsy confirmed CLL   08/18/2010 Procedure Bone marrow biopsy confirmed CLL   11/18/2010 - 04/15/2011 Chemotherapy patient received 6 cycles of FLudarabine and Rituximab   01/13/2012 Relapse/Recurrence Repeat BM biopsy confirmed relapse   01/27/2012 - 06/23/2012 Chemotherapy Bendamustine & Rituximab given, complicated by infusion reaction to Rituximab, completed 6 cycles   06/29/2013 Imaging Ct scan confirmed disease relapse with bulky lymphadenopathy   07/23/2013 Procedure Repeat BM biopsy due to thrombocytopenia and progressive luekocytosis   08/02/2013 Relapse/Recurrence Patient consented to start iburitinib as salvage therapy for relapsed CLL   11/09/2013 Imaging Repeat CT scan the chest, abdomen and pelvis showed greater than 50% response to treatment   06/17/2014 Imaging Repeat CT scan showed near complete response to treatment.   05/30/2015 Imaging Repeat CT scan showed near complete response to treatment.    INTERVAL HISTORY: Please see below for problem oriented charting. She returns for further follow-up. Her niece is present. The patient has been doing well. Denies recent infection. She is compliant taking all her medications as prescribed. She has minor bruising. She had recent fall and the family is arranging for her to be moved to assisted living  REVIEW OF SYSTEMS:   Constitutional: Denies fevers, chills or abnormal weight loss Eyes: Denies blurriness of vision Ears, nose, mouth, throat, and face: Denies mucositis or sore throat Respiratory: Denies cough, dyspnea or wheezes Cardiovascular: Denies palpitation, chest discomfort or lower extremity swelling Gastrointestinal:  Denies nausea, heartburn or change in bowel habits Skin: Denies abnormal skin  rashes Lymphatics: Denies new lymphadenopathy  Neurological:Denies numbness, tingling or new weaknesses Behavioral/Psych: Mood is stable, no new changes  All other systems were reviewed with the patient and are negative.  I have reviewed the past medical history, past surgical history, social history and family history with the patient and they are unchanged from previous note.  ALLERGIES:  is allergic to azithromycin and ibuprofen.  MEDICATIONS:  Current Outpatient Prescriptions  Medication Sig Dispense Refill  . acyclovir (ZOVIRAX) 400 MG tablet Take 400 mg by mouth daily.    Marland Kitchen allopurinol (ZYLOPRIM) 300 MG tablet Take 300 mg by mouth every morning.    Marland Kitchen amLODipine (NORVASC) 10 MG tablet TAKE 1 TABLET (10 MG TOTAL) BY MOUTH DAILY. 90 tablet 1  . Carboxymethylcellul-Glycerin (REFRESH OPTIVE SENSITIVE OP) Place 1 drop into both eyes 2 (two) times daily.     . citalopram (CELEXA) 20 MG tablet Take 20 mg by mouth every morning.     . ibrutinib (IMBRUVICA) 140 MG capsul Take 3 capsules (420 mg total) by mouth daily. 90 capsule 11  . losartan-hydrochlorothiazide (HYZAAR) 100-12.5 MG per tablet Take 1 tablet by mouth every morning.      No current facility-administered medications for this visit.    PHYSICAL EXAMINATION: ECOG PERFORMANCE STATUS: 0 - Asymptomatic  Filed Vitals:   12/15/15 1017  BP: 150/75  Pulse: 81  Temp: 98.1 F (36.7 C)  Resp: 18   Filed Weights   12/15/15 1017  Weight: 269 lb 9.6 oz (122.29 kg)    GENERAL:alert, no distress and comfortable SKIN: skin color, texture, turgor are normal, no rashes or significant lesions EYES: normal, Conjunctiva are pink and non-injected, sclera clear OROPHARYNX:no exudate, no erythema and lips, buccal mucosa, and tongue normal  NECK: supple, thyroid normal  size, non-tender, without nodularity LYMPH:  no palpable lymphadenopathy in the cervical, axillary or inguinal LUNGS: clear to auscultation and percussion with normal  breathing effort HEART: regular rate & rhythm and no murmurs and no lower extremity edema ABDOMEN:abdomen soft, non-tender and normal bowel sounds Musculoskeletal:no cyanosis of digits and no clubbing  NEURO: alert & oriented x 3 with fluent speech, no focal motor/sensory deficits  LABORATORY DATA:  I have reviewed the data as listed    Component Value Date/Time   NA 141 12/15/2015 1004   NA 141 05/19/2012 1036   K 3.8 12/15/2015 1004   K 4.3 05/19/2012 1036   CL 98 02/02/2013 1353   CL 102 05/19/2012 1036   CO2 29 12/15/2015 1004   CO2 30 05/19/2012 1036   GLUCOSE 99 12/15/2015 1004   GLUCOSE 99 02/02/2013 1353   GLUCOSE 85 05/19/2012 1036   BUN 21.2 12/15/2015 1004   BUN 21 05/19/2012 1036   CREATININE 1.2* 12/15/2015 1004   CREATININE 0.96 05/19/2012 1036   CALCIUM 9.5 12/15/2015 1004   CALCIUM 9.2 05/19/2012 1036   PROT 6.4 12/15/2015 1004   PROT 5.7* 05/19/2012 1036   ALBUMIN 3.8 12/15/2015 1004   ALBUMIN 4.2 05/19/2012 1036   AST 14 12/15/2015 1004   AST 20 05/19/2012 1036   ALT 21 12/15/2015 1004   ALT 22 05/19/2012 1036   ALKPHOS 69 12/15/2015 1004   ALKPHOS 106 05/19/2012 1036   BILITOT 0.47 12/15/2015 1004   BILITOT 0.5 05/19/2012 1036   GFRNONAA 56* 08/31/2010 1429   GFRAA  08/31/2010 1429    >60        The eGFR has been calculated using the MDRD equation. This calculation has not been validated in all clinical situations. eGFR's persistently <60 mL/min signify possible Chronic Kidney Disease.    No results found for: SPEP, UPEP  Lab Results  Component Value Date   WBC 7.5 12/15/2015   NEUTROABS 5.8 12/15/2015   HGB 13.1 12/15/2015   HCT 39.6 12/15/2015   MCV 87.1 12/15/2015   PLT 147 12/15/2015      Chemistry      Component Value Date/Time   NA 141 12/15/2015 1004   NA 141 05/19/2012 1036   K 3.8 12/15/2015 1004   K 4.3 05/19/2012 1036   CL 98 02/02/2013 1353   CL 102 05/19/2012 1036   CO2 29 12/15/2015 1004   CO2 30 05/19/2012  1036   BUN 21.2 12/15/2015 1004   BUN 21 05/19/2012 1036   CREATININE 1.2* 12/15/2015 1004   CREATININE 0.96 05/19/2012 1036      Component Value Date/Time   CALCIUM 9.5 12/15/2015 1004   CALCIUM 9.2 05/19/2012 1036   ALKPHOS 69 12/15/2015 1004   ALKPHOS 106 05/19/2012 1036   AST 14 12/15/2015 1004   AST 20 05/19/2012 1036   ALT 21 12/15/2015 1004   ALT 22 05/19/2012 1036   BILITOT 0.47 12/15/2015 1004   BILITOT 0.5 05/19/2012 1036      ASSESSMENT & PLAN:  CLL (chronic lymphocytic leukemia) ([place]) She had excellent response to treatment. Last CT scan showed complete response. Her white blood cell count has normalized. I will continue current dosage of chemotherapy with the adjustment.  I will see her back in 6 months for further assessment, with blood work, history, imaging study and physical examination.      Essential hypertension She is not symptomatic. she will continue current medical management. I recommend close follow-up with primary care doctor for  medication adjustment.        Orders Placed This Encounter  Procedures  . CT Chest W Contrast    Standing Status: Future     Number of Occurrences:      Standing Expiration Date: 03/16/2017    Order Specific Question:  Reason for Exam (SYMPTOM  OR DIAGNOSIS REQUIRED)    Answer:  CLL, splenomegaly, on Rx, exclude progression    Order Specific Question:  Preferred imaging location?    Answer:  [facility]  . CT Abdomen Pelvis W Contrast    Standing Status: Future     Number of Occurrences:      Standing Expiration Date: 03/16/2017    Order Specific Question:  Reason for Exam (SYMPTOM  OR DIAGNOSIS REQUIRED)    Answer:  CLL, splenomegaly, on Rx, exclude progression    Order Specific Question:  Preferred imaging location?    Answer:  [facility]  . Comprehensive metabolic panel    Standing Status: Future     Number of Occurrences:      Standing Expiration Date: 03/16/2017  . CBC with  Differential/Platelet    Standing Status: Future     Number of Occurrences:      Standing Expiration Date: 03/16/2017   All questions were answered. The patient knows to call the clinic with any problems, questions or concerns. No barriers to learning was detected. I spent 15 minutes counseling the patient face to face. The total time spent in the appointment was 20 minutes and more than 50% was on counseling and review of test results     [facility], [place], MD 12/16/2015 9:26 AM

## 2016-01-06 DIAGNOSIS — R2689 Other abnormalities of gait and mobility: Secondary | ICD-10-CM | POA: Diagnosis not present

## 2016-01-06 DIAGNOSIS — R2681 Unsteadiness on feet: Secondary | ICD-10-CM | POA: Diagnosis not present

## 2016-01-06 DIAGNOSIS — R293 Abnormal posture: Secondary | ICD-10-CM | POA: Diagnosis not present

## 2016-01-06 DIAGNOSIS — R296 Repeated falls: Secondary | ICD-10-CM | POA: Diagnosis not present

## 2016-01-19 ENCOUNTER — Other Ambulatory Visit: Payer: Self-pay | Admitting: *Deleted

## 2016-01-19 DIAGNOSIS — R2689 Other abnormalities of gait and mobility: Secondary | ICD-10-CM | POA: Diagnosis not present

## 2016-01-19 DIAGNOSIS — R296 Repeated falls: Secondary | ICD-10-CM | POA: Diagnosis not present

## 2016-01-19 DIAGNOSIS — R2681 Unsteadiness on feet: Secondary | ICD-10-CM | POA: Diagnosis not present

## 2016-01-19 DIAGNOSIS — R293 Abnormal posture: Secondary | ICD-10-CM | POA: Diagnosis not present

## 2016-01-19 DIAGNOSIS — C9110 Chronic lymphocytic leukemia of B-cell type not having achieved remission: Secondary | ICD-10-CM

## 2016-01-19 DIAGNOSIS — C9190 Lymphoid leukemia, unspecified not having achieved remission: Secondary | ICD-10-CM

## 2016-01-19 MED ORDER — IBRUTINIB 140 MG PO CAPS: 420.0000 mg | ORAL_CAPSULE | Freq: Every day | ORAL | Status: DC

## 2016-02-16 DIAGNOSIS — M25512 Pain in left shoulder: Secondary | ICD-10-CM | POA: Diagnosis not present

## 2016-02-16 DIAGNOSIS — R2689 Other abnormalities of gait and mobility: Secondary | ICD-10-CM | POA: Diagnosis not present

## 2016-02-16 DIAGNOSIS — R293 Abnormal posture: Secondary | ICD-10-CM | POA: Diagnosis not present

## 2016-02-16 DIAGNOSIS — R296 Repeated falls: Secondary | ICD-10-CM | POA: Diagnosis not present

## 2016-02-16 DIAGNOSIS — R2681 Unsteadiness on feet: Secondary | ICD-10-CM | POA: Diagnosis not present

## 2016-02-19 ENCOUNTER — Other Ambulatory Visit: Payer: Self-pay

## 2016-02-19 ENCOUNTER — Encounter: Payer: Self-pay | Admitting: Hematology and Oncology

## 2016-02-19 DIAGNOSIS — Z1231 Encounter for screening mammogram for malignant neoplasm of breast: Secondary | ICD-10-CM

## 2016-02-19 NOTE — Progress Notes (Signed)
[employer] imbruvica was shipped via [employer] 02/18/16

## 2016-03-18 DIAGNOSIS — R2681 Unsteadiness on feet: Secondary | ICD-10-CM | POA: Diagnosis not present

## 2016-03-18 DIAGNOSIS — R296 Repeated falls: Secondary | ICD-10-CM | POA: Diagnosis not present

## 2016-03-18 DIAGNOSIS — M25512 Pain in left shoulder: Secondary | ICD-10-CM | POA: Diagnosis not present

## 2016-03-18 DIAGNOSIS — R2689 Other abnormalities of gait and mobility: Secondary | ICD-10-CM | POA: Diagnosis not present

## 2016-03-18 DIAGNOSIS — R293 Abnormal posture: Secondary | ICD-10-CM | POA: Diagnosis not present

## 2016-03-25 ENCOUNTER — Other Ambulatory Visit: Payer: Self-pay | Admitting: Family Medicine

## 2016-03-25 ENCOUNTER — Ambulatory Visit
Admission: RE | Admit: 2016-03-25 | Discharge: 2016-03-25 | Disposition: A | Discharge status: Home / Self Care | Payer: Medicare Other | Source: Ambulatory Visit

## 2016-03-25 DIAGNOSIS — Z1231 Encounter for screening mammogram for malignant neoplasm of breast: Secondary | ICD-10-CM

## 2016-04-14 DIAGNOSIS — H35371 Puckering of macula, right eye: Secondary | ICD-10-CM | POA: Diagnosis not present

## 2016-04-14 DIAGNOSIS — H2513 Age-related nuclear cataract, bilateral: Secondary | ICD-10-CM | POA: Diagnosis not present

## 2016-04-14 DIAGNOSIS — H35372 Puckering of macula, left eye: Secondary | ICD-10-CM | POA: Diagnosis not present

## 2016-04-14 DIAGNOSIS — H35033 Hypertensive retinopathy, bilateral: Secondary | ICD-10-CM | POA: Diagnosis not present

## 2016-04-14 DIAGNOSIS — H353130 Nonexudative age-related macular degeneration, bilateral, stage unspecified: Secondary | ICD-10-CM | POA: Diagnosis not present

## 2016-04-14 DIAGNOSIS — H25013 Cortical age-related cataract, bilateral: Secondary | ICD-10-CM | POA: Diagnosis not present

## 2016-04-21 ENCOUNTER — Encounter: Payer: Self-pay | Admitting: Hematology and Oncology

## 2016-04-21 DIAGNOSIS — D1801 Hemangioma of skin and subcutaneous tissue: Secondary | ICD-10-CM | POA: Diagnosis not present

## 2016-04-21 DIAGNOSIS — L570 Actinic keratosis: Secondary | ICD-10-CM | POA: Diagnosis not present

## 2016-04-21 DIAGNOSIS — L219 Seborrheic dermatitis, unspecified: Secondary | ICD-10-CM | POA: Diagnosis not present

## 2016-04-21 DIAGNOSIS — D235 Other benign neoplasm of skin of trunk: Secondary | ICD-10-CM | POA: Diagnosis not present

## 2016-04-21 DIAGNOSIS — L821 Other seborrheic keratosis: Secondary | ICD-10-CM | POA: Diagnosis not present

## 2016-04-21 DIAGNOSIS — L814 Other melanin hyperpigmentation: Secondary | ICD-10-CM | POA: Diagnosis not present

## 2016-04-21 NOTE — Progress Notes (Signed)
[employer] imbruvica was shipped via [employer] 04/19/16

## 2016-05-25 DIAGNOSIS — Z79899 Other long term (current) drug therapy: Secondary | ICD-10-CM | POA: Diagnosis not present

## 2016-05-25 DIAGNOSIS — M109 Gout, unspecified: Secondary | ICD-10-CM | POA: Diagnosis not present

## 2016-05-25 DIAGNOSIS — E559 Vitamin D deficiency, unspecified: Secondary | ICD-10-CM | POA: Diagnosis not present

## 2016-05-25 DIAGNOSIS — F325 Major depressive disorder, single episode, in full remission: Secondary | ICD-10-CM | POA: Diagnosis not present

## 2016-05-25 DIAGNOSIS — J3489 Other specified disorders of nose and nasal sinuses: Secondary | ICD-10-CM | POA: Diagnosis not present

## 2016-05-25 DIAGNOSIS — C8374 Burkitt lymphoma, lymph nodes of axilla and upper limb: Secondary | ICD-10-CM | POA: Diagnosis not present

## 2016-05-25 DIAGNOSIS — Z0000 Encounter for general adult medical examination without abnormal findings: Secondary | ICD-10-CM | POA: Diagnosis not present

## 2016-05-25 DIAGNOSIS — E782 Mixed hyperlipidemia: Secondary | ICD-10-CM | POA: Diagnosis not present

## 2016-05-25 DIAGNOSIS — M129 Arthropathy, unspecified: Secondary | ICD-10-CM | POA: Diagnosis not present

## 2016-06-14 ENCOUNTER — Encounter (HOSPITAL_COMMUNITY): Payer: Self-pay

## 2016-06-14 ENCOUNTER — Ambulatory Visit (HOSPITAL_COMMUNITY)
Admission: RE | Admit: 2016-06-14 | Discharge: 2016-06-14 | Disposition: A | Discharge status: Home / Self Care | Payer: Medicare Other | Source: Ambulatory Visit | Attending: Hematology and Oncology | Admitting: Hematology and Oncology

## 2016-06-14 ENCOUNTER — Other Ambulatory Visit (HOSPITAL_BASED_OUTPATIENT_CLINIC_OR_DEPARTMENT_OTHER): Payer: Medicare Other

## 2016-06-14 DIAGNOSIS — C9110 Chronic lymphocytic leukemia of B-cell type not having achieved remission: Secondary | ICD-10-CM | POA: Diagnosis not present

## 2016-06-14 DIAGNOSIS — R161 Splenomegaly, not elsewhere classified: Secondary | ICD-10-CM | POA: Insufficient documentation

## 2016-06-14 DIAGNOSIS — F79 Unspecified intellectual disabilities: Secondary | ICD-10-CM

## 2016-06-14 DIAGNOSIS — R590 Localized enlarged lymph nodes: Secondary | ICD-10-CM | POA: Insufficient documentation

## 2016-06-14 LAB — CBC WITH DIFFERENTIAL/PLATELET
BASO%: 0.5 % (ref 0.0–2.0)
Basophils Absolute: 0 10*3/uL (ref 0.0–0.1)
EOS%: 1.7 % (ref 0.0–7.0)
Eosinophils Absolute: 0.1 10*3/uL (ref 0.0–0.5)
HCT: 37.9 % (ref 34.8–46.6)
HGB: 12.5 g/dL (ref 11.6–15.9)
LYMPH%: 11.2 % — ABNORMAL LOW (ref 14.0–49.7)
MCH: 27.8 pg (ref 25.1–34.0)
MCHC: 33 g/dL (ref 31.5–36.0)
MCV: 84.4 fL (ref 79.5–101.0)
MONO#: 0.5 10*3/uL (ref 0.1–0.9)
MONO%: 8.9 % (ref 0.0–14.0)
NEUT#: 4.5 10*3/uL (ref 1.5–6.5)
NEUT%: 77.7 % — ABNORMAL HIGH (ref 38.4–76.8)
Platelets: 136 10*3/uL — ABNORMAL LOW (ref 145–400)
RBC: 4.49 10*6/uL (ref 3.70–5.45)
RDW: 13.4 % (ref 11.2–14.5)
WBC: 5.8 10*3/uL (ref 3.9–10.3)
lymph#: 0.7 10*3/uL — ABNORMAL LOW (ref 0.9–3.3)

## 2016-06-14 LAB — COMPREHENSIVE METABOLIC PANEL
ALT: 17 U/L (ref 0–55)
AST: 15 U/L (ref 5–34)
Albumin: 3.7 g/dL (ref 3.5–5.0)
Alkaline Phosphatase: 64 U/L (ref 40–150)
Anion Gap: 10 mEq/L (ref 3–11)
BUN: 18.3 mg/dL (ref 7.0–26.0)
CO2: 30 mEq/L — ABNORMAL HIGH (ref 22–29)
Calcium: 9.4 mg/dL (ref 8.4–10.4)
Chloride: 102 mEq/L (ref 98–109)
Creatinine: 1 mg/dL (ref 0.6–1.1)
EGFR: 56 mL/min/{1.73_m2} — ABNORMAL LOW (ref >90)
Glucose: 93 mg/dl (ref 70–140)
Potassium: 3.4 mEq/L — ABNORMAL LOW (ref 3.5–5.1)
Sodium: 142 mEq/L (ref 136–145)
Total Bilirubin: 0.6 mg/dL (ref 0.20–1.20)
Total Protein: 6.1 g/dL — ABNORMAL LOW (ref 6.4–8.3)

## 2016-06-14 LAB — TECHNOLOGIST REVIEW

## 2016-06-14 MED ORDER — IOPAMIDOL (ISOVUE-300) INJECTION 61%
100.0000 mL | Freq: Once | INTRAVENOUS | Status: AC | PRN
  Administered 2016-06-14: 100 mL via INTRAVENOUS

## 2016-06-15 ENCOUNTER — Ambulatory Visit (HOSPITAL_BASED_OUTPATIENT_CLINIC_OR_DEPARTMENT_OTHER): Payer: Medicare Other | Admitting: Hematology and Oncology

## 2016-06-15 ENCOUNTER — Encounter: Payer: Self-pay | Admitting: Hematology and Oncology

## 2016-06-15 VITALS — BP 147/63 | HR 73 | Temp 97.9°F | Resp 18 | Ht 63.0 in | Wt 273.4 lb

## 2016-06-15 DIAGNOSIS — C9110 Chronic lymphocytic leukemia of B-cell type not having achieved remission: Secondary | ICD-10-CM | POA: Diagnosis not present

## 2016-06-15 DIAGNOSIS — R161 Splenomegaly, not elsewhere classified: Secondary | ICD-10-CM

## 2016-06-15 DIAGNOSIS — I10 Essential (primary) hypertension: Secondary | ICD-10-CM

## 2016-06-15 DIAGNOSIS — D696 Thrombocytopenia, unspecified: Secondary | ICD-10-CM

## 2016-06-15 NOTE — Progress Notes (Signed)
[place] OFFICE PROGRESS NOTE  Patient Care Team: Jonathon Jordan, MD as PCP - General (Family Medicine)  SUMMARY OF ONCOLOGIC HISTORY:   CLL (chronic lymphocytic leukemia) ([place])   07/02/2010 Procedure    LN biopsy confirmed CLL      08/18/2010 Procedure    Bone marrow biopsy confirmed CLL      11/18/2010 - 04/15/2011 Chemotherapy    patient received 6 cycles of FLudarabine and Rituximab      01/13/2012 Relapse/Recurrence    Repeat BM biopsy confirmed relapse      01/27/2012 - 06/23/2012 Chemotherapy    Bendamustine & Rituximab given, complicated by infusion reaction to Rituximab, completed 6 cycles      06/29/2013 Imaging    Ct scan confirmed disease relapse with bulky lymphadenopathy      07/23/2013 Procedure    Repeat BM biopsy due to thrombocytopenia and progressive leukocytosis      08/02/2013 Relapse/Recurrence    Patient consented to start iburitinib as salvage therapy for relapsed CLL      11/09/2013 Imaging    Repeat CT scan the chest, abdomen and pelvis showed greater than 50% response to treatment      06/17/2014 Imaging    Repeat CT scan showed near complete response to treatment.      05/30/2015 Imaging    Repeat CT scan showed near complete response to treatment.      06/14/2016 Imaging    CT scan showed stable scattered small retroperitoneal lymph nodes and pelvic lymph nodes. No findings for recurrent or progressive lymphoma. Stable mild splenomegaly.       INTERVAL HISTORY: Please see below for problem oriented charting. She returns for follow-up. She had recent falls and is using a cane. The patient denies any recent signs or symptoms of bleeding such as spontaneous epistaxis, hematuria or hematochezia. She denies new lymphadenopathy. Denies recent infection  REVIEW OF SYSTEMS:   Constitutional: Denies fevers, chills or abnormal weight loss Eyes: Denies blurriness of vision Ears, nose, mouth, throat, and face: Denies mucositis or  sore throat Respiratory: Denies cough, dyspnea or wheezes Cardiovascular: Denies palpitation, chest discomfort or lower extremity swelling Gastrointestinal:  Denies nausea, heartburn or change in bowel habits Skin: Denies abnormal skin rashes Lymphatics: Denies new lymphadenopathy  Neurological:Denies numbness, tingling or new weaknesses Behavioral/Psych: Mood is stable, no new changes  All other systems were reviewed with the patient and are negative.  I have reviewed the past medical history, past surgical history, social history and family history with the patient and they are unchanged from previous note.  ALLERGIES:  is allergic to azithromycin and ibuprofen [ibuprofen].  MEDICATIONS:  Current Outpatient Prescriptions  Medication Sig Dispense Refill  . amLODipine (NORVASC) 10 MG tablet TAKE 1 TABLET (10 MG TOTAL) BY MOUTH DAILY. 90 tablet 1  . Carboxymethylcellul-Glycerin (REFRESH OPTIVE SENSITIVE OP) Place 1 drop into both eyes as needed.     . citalopram (CELEXA) 20 MG tablet Take 20 mg by mouth every morning.     . ibrutinib (IMBRUVICA) 140 MG capsul Take 3 capsules (420 mg total) by mouth daily. 90 capsule 11  . losartan-hydrochlorothiazide (HYZAAR) 100-12.5 MG per tablet Take 1 tablet by mouth every morning.     . Multiple Vitamins-Minerals (PRESERVISION AREDS 2) CAPS Take 1 capsule by mouth 2 (two) times daily.     No current facility-administered medications for this visit.     PHYSICAL EXAMINATION: ECOG PERFORMANCE STATUS: 0 - Asymptomatic  Vitals:   06/15/16 1033  BP: (!) 147/63  Pulse: 73  Resp: 18  Temp: 97.9 F (36.6 C)   Filed Weights   06/15/16 1033  Weight: 273 lb 6.4 oz (124 kg)    GENERAL:alert, no distress and comfortable. She is morbidly obese SKIN: Noted minus skin bruising. EYES: normal, Conjunctiva are pink and non-injected, sclera clear OROPHARYNX:no exudate, no erythema and lips, buccal mucosa, and tongue normal  NECK: supple, thyroid normal  size, non-tender, without nodularity LYMPH:  no palpable lymphadenopathy in the cervical, axillary or inguinal LUNGS: clear to auscultation and percussion with normal breathing effort HEART: regular rate & rhythm and no murmurs and no lower extremity edema ABDOMEN:abdomen soft, non-tender and normal bowel sounds Musculoskeletal:no cyanosis of digits and no clubbing  NEURO: alert & oriented x 3 with fluent speech, no focal motor/sensory deficits  LABORATORY DATA:  I have reviewed the data as listed    Component Value Date/Time   NA 142 06/14/2016 0917   K 3.4 (L) 06/14/2016 0917   CL 98 02/02/2013 1353   CO2 30 (H) 06/14/2016 0917   GLUCOSE 93 06/14/2016 0917   GLUCOSE 99 02/02/2013 1353   BUN 18.3 06/14/2016 0917   CREATININE 1.0 06/14/2016 0917   CALCIUM 9.4 06/14/2016 0917   PROT 6.1 (L) 06/14/2016 0917   ALBUMIN 3.7 06/14/2016 0917   AST 15 06/14/2016 0917   ALT 17 06/14/2016 0917   ALKPHOS 64 06/14/2016 0917   BILITOT 0.60 06/14/2016 0917   GFRNONAA 56 (L) 08/31/2010 1429   GFRAA  08/31/2010 1429    >60        The eGFR has been calculated using the MDRD equation. This calculation has not been validated in all clinical situations. eGFR's persistently <60 mL/min signify possible Chronic Kidney Disease.    No results found for: SPEP, UPEP  Lab Results  Component Value Date   WBC 5.8 06/14/2016   NEUTROABS 4.5 06/14/2016   HGB 12.5 06/14/2016   HCT 37.9 06/14/2016   MCV 84.4 06/14/2016   PLT 136 (L) 06/14/2016      Chemistry      Component Value Date/Time   NA 142 06/14/2016 0917   K 3.4 (L) 06/14/2016 0917   CL 98 02/02/2013 1353   CO2 30 (H) 06/14/2016 0917   BUN 18.3 06/14/2016 0917   CREATININE 1.0 06/14/2016 0917      Component Value Date/Time   CALCIUM 9.4 06/14/2016 0917   ALKPHOS 64 06/14/2016 0917   AST 15 06/14/2016 0917   ALT 17 06/14/2016 0917   BILITOT 0.60 06/14/2016 0917       RADIOGRAPHIC STUDIES: I have personally reviewed the  radiological images as listed and agreed with the findings in the report. Ct Chest W Contrast  Result Date: 06/14/2016 CLINICAL DATA:  Restaging CLL. EXAM: CT CHEST, ABDOMEN, AND PELVIS WITH CONTRAST TECHNIQUE: Multidetector CT imaging of the chest, abdomen and pelvis was performed following the standard protocol during bolus administration of intravenous contrast. CONTRAST:  14m ISOVUE-300 IOPAMIDOL (ISOVUE-300) INJECTION 61% COMPARISON:  05/30/2015 FINDINGS: CT CHEST FINDINGS Chest wall: No breast masses, supraclavicular or axillary lymphadenopathy. The thyroid gland is normal. Cardiovascular: The heart is normal in size. No pericardial effusion. Moderate atherosclerotic calcifications involving the aortic arch. No aneurysm or dissection. Mediastinum/Lymph Nodes: Small scattered mediastinal and hilar lymph nodes but no mass or adenopathy. The esophagus is grossly normal. Lungs/Pleura: No acute pulmonary findings. No worrisome pulmonary lesions. No pleural effusion. Musculoskeletal: No significant bony findings. No bone lesions. Stable degenerative changes  involving the spine. CT ABDOMEN PELVIS FINDINGS Hepatobiliary: No focal hepatic lesions or intrahepatic biliary dilatation. Gallbladder is surgically absent. No common bile duct dilatation. Pancreas: No mass, inflammation or ductal dilatation. Spleen: Stable splenomegaly. The spleen measures 15 x 14 x 9.2 cm. No focal lesions. Adrenals/Urinary Tract: The adrenal glands are unremarkable and stable. No renal mass or hydronephrosis. No obstructing ureteral calculi or bladder calculi. Stomach/Bowel: The stomach, duodenum, small bowel and colon are grossly normal. No inflammatory changes, mass lesions or obstructive findings. The terminal ileum is normal. The appendix is normal. Vascular/Lymphatic: Stable scattered atherosclerotic calcifications involving the abdominal aorta and branch vessels. No aneurysm or dissection. No mesenteric or retroperitoneal mass or  adenopathy. Stable small scattered retroperitoneal lymph nodes. 8 mm lymph node noted on image number 75. 7 mm lymph node on image number 84. In the pelvis stable external iliac lymph nodes bilaterally. 10.5 mm lymph node on the left and 9 mm lymph node on the right. No inguinal adenopathy. Reproductive: The uterus is surgically absent. The ovaries are not identified. Other: No pelvic mass or adenopathy. No free pelvic fluid collections. No inguinal mass or adenopathy. Musculoskeletal: Moderate degenerative changes involving the spine. No acute bony findings. IMPRESSION: Stable scattered small retroperitoneal lymph nodes and pelvic lymph nodes. No findings for recurrent or progressive lymphoma. No acute abdominal/ pelvic findings. No significant findings chest. Stable mild splenomegaly. Electronically Signed   By: Marijo Sanes M.D.   On: 06/14/2016 15:18   Ct Abdomen Pelvis W Contrast  Result Date: 06/14/2016 CLINICAL DATA:  Restaging CLL. EXAM: CT CHEST, ABDOMEN, AND PELVIS WITH CONTRAST TECHNIQUE: Multidetector CT imaging of the chest, abdomen and pelvis was performed following the standard protocol during bolus administration of intravenous contrast. CONTRAST:  166m ISOVUE-300 IOPAMIDOL (ISOVUE-300) INJECTION 61% COMPARISON:  05/30/2015 FINDINGS: CT CHEST FINDINGS Chest wall: No breast masses, supraclavicular or axillary lymphadenopathy. The thyroid gland is normal. Cardiovascular: The heart is normal in size. No pericardial effusion. Moderate atherosclerotic calcifications involving the aortic arch. No aneurysm or dissection. Mediastinum/Lymph Nodes: Small scattered mediastinal and hilar lymph nodes but no mass or adenopathy. The esophagus is grossly normal. Lungs/Pleura: No acute pulmonary findings. No worrisome pulmonary lesions. No pleural effusion. Musculoskeletal: No significant bony findings. No bone lesions. Stable degenerative changes involving the spine. CT ABDOMEN PELVIS FINDINGS Hepatobiliary:  No focal hepatic lesions or intrahepatic biliary dilatation. Gallbladder is surgically absent. No common bile duct dilatation. Pancreas: No mass, inflammation or ductal dilatation. Spleen: Stable splenomegaly. The spleen measures 15 x 14 x 9.2 cm. No focal lesions. Adrenals/Urinary Tract: The adrenal glands are unremarkable and stable. No renal mass or hydronephrosis. No obstructing ureteral calculi or bladder calculi. Stomach/Bowel: The stomach, duodenum, small bowel and colon are grossly normal. No inflammatory changes, mass lesions or obstructive findings. The terminal ileum is normal. The appendix is normal. Vascular/Lymphatic: Stable scattered atherosclerotic calcifications involving the abdominal aorta and branch vessels. No aneurysm or dissection. No mesenteric or retroperitoneal mass or adenopathy. Stable small scattered retroperitoneal lymph nodes. 8 mm lymph node noted on image number 75. 7 mm lymph node on image number 84. In the pelvis stable external iliac lymph nodes bilaterally. 10.5 mm lymph node on the left and 9 mm lymph node on the right. No inguinal adenopathy. Reproductive: The uterus is surgically absent. The ovaries are not identified. Other: No pelvic mass or adenopathy. No free pelvic fluid collections. No inguinal mass or adenopathy. Musculoskeletal: Moderate degenerative changes involving the spine. No acute bony findings. IMPRESSION: Stable  scattered small retroperitoneal lymph nodes and pelvic lymph nodes. No findings for recurrent or progressive lymphoma. No acute abdominal/ pelvic findings. No significant findings chest. Stable mild splenomegaly. Electronically Signed   By: Marijo Sanes M.D.   On: 06/14/2016 15:18     ASSESSMENT & PLAN:  CLL (chronic lymphocytic leukemia) ([place]) She had excellent response to treatment. Last CT scan showed complete response. Her white blood cell count has normalized. I will continue current dosage of chemotherapy with the adjustment.  I will see  her back in 6 months for further assessment, with blood work, history and physical examination.    Essential hypertension She is not symptomatic. she will continue current medical management. I recommend close follow-up with primary care doctor for medication adjustment.     Morbid obesity due to excess calories [facility]) The patient has progressive weight gain. She has been educated about portion control, eating healthy and continues to exercise regularly  Thrombocytopenia, unspecified ([place]) This is related to her underlying disease and mild splenomegaly Overall, it is stable. She is not symptomatic apart from some minor bruising. Continue observation and same dose treatment   Orders Placed This Encounter  Procedures  . CBC with Differential/Platelet    Standing Status:   Future    Standing Expiration Date:   07/20/2017  . Comprehensive metabolic panel    Standing Status:   Future    Standing Expiration Date:   07/20/2017   All questions were answered. The patient knows to call the clinic with any problems, questions or concerns. No barriers to learning was detected. I spent 15 minutes counseling the patient face to face. The total time spent in the appointment was 20 minutes and more than 50% was on counseling and review of test results     [employer], [place], MD 06/15/2016 10:55 AM

## 2016-06-15 NOTE — Assessment & Plan Note (Signed)
The patient has progressive weight gain. She has been educated about portion control, eating healthy and continues to exercise regularly 

## 2016-06-15 NOTE — Assessment & Plan Note (Signed)
This is related to her underlying disease and mild splenomegaly Overall, it is stable. She is not symptomatic apart from some minor bruising. Continue observation and same dose treatment

## 2016-06-15 NOTE — Assessment & Plan Note (Signed)
She is not symptomatic. she will continue current medical management. I recommend close follow-up with primary care doctor for medication adjustment.

## 2016-06-15 NOTE — Assessment & Plan Note (Signed)
She had excellent response to treatment. Last CT scan showed complete response. Her white blood cell count has normalized. I will continue current dosage of chemotherapy with the adjustment.  I will see her back in 6 months for further assessment, with blood work, history and physical examination.

## 2016-06-23 DIAGNOSIS — M8588 Other specified disorders of bone density and structure, other site: Secondary | ICD-10-CM | POA: Diagnosis not present

## 2016-06-23 DIAGNOSIS — E2839 Other primary ovarian failure: Secondary | ICD-10-CM | POA: Diagnosis not present

## 2016-06-25 ENCOUNTER — Telehealth: Payer: Self-pay | Admitting: Hematology and Oncology

## 2016-06-25 NOTE — Telephone Encounter (Signed)
Called patient to conf appts per 06/15/16 los. L/M. Appt ltr and schd mailed.

## 2016-09-29 DIAGNOSIS — R296 Repeated falls: Secondary | ICD-10-CM | POA: Diagnosis not present

## 2016-09-29 DIAGNOSIS — M6281 Muscle weakness (generalized): Secondary | ICD-10-CM | POA: Diagnosis not present

## 2016-09-29 DIAGNOSIS — M25511 Pain in right shoulder: Secondary | ICD-10-CM | POA: Diagnosis not present

## 2016-09-29 DIAGNOSIS — M25512 Pain in left shoulder: Secondary | ICD-10-CM | POA: Diagnosis not present

## 2016-10-19 DIAGNOSIS — M25512 Pain in left shoulder: Secondary | ICD-10-CM | POA: Diagnosis not present

## 2016-10-19 DIAGNOSIS — R296 Repeated falls: Secondary | ICD-10-CM | POA: Diagnosis not present

## 2016-10-19 DIAGNOSIS — M6281 Muscle weakness (generalized): Secondary | ICD-10-CM | POA: Diagnosis not present

## 2016-10-19 DIAGNOSIS — M25511 Pain in right shoulder: Secondary | ICD-10-CM | POA: Diagnosis not present

## 2016-11-18 DIAGNOSIS — R296 Repeated falls: Secondary | ICD-10-CM | POA: Diagnosis not present

## 2016-11-18 DIAGNOSIS — M6281 Muscle weakness (generalized): Secondary | ICD-10-CM | POA: Diagnosis not present

## 2016-11-19 DIAGNOSIS — R296 Repeated falls: Secondary | ICD-10-CM | POA: Diagnosis not present

## 2016-11-19 DIAGNOSIS — M6281 Muscle weakness (generalized): Secondary | ICD-10-CM | POA: Diagnosis not present

## 2016-11-23 DIAGNOSIS — Z23 Encounter for immunization: Secondary | ICD-10-CM | POA: Diagnosis not present

## 2016-11-23 DIAGNOSIS — J329 Chronic sinusitis, unspecified: Secondary | ICD-10-CM | POA: Diagnosis not present

## 2016-11-23 DIAGNOSIS — R05 Cough: Secondary | ICD-10-CM | POA: Diagnosis not present

## 2016-11-24 DIAGNOSIS — R296 Repeated falls: Secondary | ICD-10-CM | POA: Diagnosis not present

## 2016-11-24 DIAGNOSIS — M6281 Muscle weakness (generalized): Secondary | ICD-10-CM | POA: Diagnosis not present

## 2016-11-25 DIAGNOSIS — M6281 Muscle weakness (generalized): Secondary | ICD-10-CM | POA: Diagnosis not present

## 2016-11-25 DIAGNOSIS — R296 Repeated falls: Secondary | ICD-10-CM | POA: Diagnosis not present

## 2016-11-28 DIAGNOSIS — M6281 Muscle weakness (generalized): Secondary | ICD-10-CM | POA: Diagnosis not present

## 2016-11-28 DIAGNOSIS — R296 Repeated falls: Secondary | ICD-10-CM | POA: Diagnosis not present

## 2016-11-29 DIAGNOSIS — R296 Repeated falls: Secondary | ICD-10-CM | POA: Diagnosis not present

## 2016-11-29 DIAGNOSIS — M6281 Muscle weakness (generalized): Secondary | ICD-10-CM | POA: Diagnosis not present

## 2016-12-01 DIAGNOSIS — R296 Repeated falls: Secondary | ICD-10-CM | POA: Diagnosis not present

## 2016-12-01 DIAGNOSIS — M6281 Muscle weakness (generalized): Secondary | ICD-10-CM | POA: Diagnosis not present

## 2016-12-03 DIAGNOSIS — M6281 Muscle weakness (generalized): Secondary | ICD-10-CM | POA: Diagnosis not present

## 2016-12-03 DIAGNOSIS — R296 Repeated falls: Secondary | ICD-10-CM | POA: Diagnosis not present

## 2016-12-07 DIAGNOSIS — R296 Repeated falls: Secondary | ICD-10-CM | POA: Diagnosis not present

## 2016-12-07 DIAGNOSIS — M6281 Muscle weakness (generalized): Secondary | ICD-10-CM | POA: Diagnosis not present

## 2016-12-08 DIAGNOSIS — R296 Repeated falls: Secondary | ICD-10-CM | POA: Diagnosis not present

## 2016-12-08 DIAGNOSIS — M6281 Muscle weakness (generalized): Secondary | ICD-10-CM | POA: Diagnosis not present

## 2016-12-10 DIAGNOSIS — R296 Repeated falls: Secondary | ICD-10-CM | POA: Diagnosis not present

## 2016-12-10 DIAGNOSIS — M6281 Muscle weakness (generalized): Secondary | ICD-10-CM | POA: Diagnosis not present

## 2016-12-13 DIAGNOSIS — R296 Repeated falls: Secondary | ICD-10-CM | POA: Diagnosis not present

## 2016-12-13 DIAGNOSIS — M6281 Muscle weakness (generalized): Secondary | ICD-10-CM | POA: Diagnosis not present

## 2016-12-15 DIAGNOSIS — R296 Repeated falls: Secondary | ICD-10-CM | POA: Diagnosis not present

## 2016-12-15 DIAGNOSIS — M6281 Muscle weakness (generalized): Secondary | ICD-10-CM | POA: Diagnosis not present

## 2016-12-16 ENCOUNTER — Telehealth: Payer: Self-pay | Admitting: Medical Oncology

## 2016-12-16 ENCOUNTER — Ambulatory Visit (HOSPITAL_BASED_OUTPATIENT_CLINIC_OR_DEPARTMENT_OTHER): Payer: Medicare Other | Admitting: Hematology and Oncology

## 2016-12-16 ENCOUNTER — Other Ambulatory Visit (HOSPITAL_BASED_OUTPATIENT_CLINIC_OR_DEPARTMENT_OTHER): Payer: Medicare Other

## 2016-12-16 VITALS — BP 163/68 | HR 88 | Temp 98.2°F | Resp 18 | Ht 63.0 in | Wt 287.0 lb

## 2016-12-16 DIAGNOSIS — I10 Essential (primary) hypertension: Secondary | ICD-10-CM | POA: Diagnosis not present

## 2016-12-16 DIAGNOSIS — C9110 Chronic lymphocytic leukemia of B-cell type not having achieved remission: Secondary | ICD-10-CM | POA: Diagnosis not present

## 2016-12-16 LAB — CBC WITH DIFFERENTIAL/PLATELET
BASO%: 0.9 % (ref 0.0–2.0)
Basophils Absolute: 0.1 10*3/uL (ref 0.0–0.1)
EOS%: 1.4 % (ref 0.0–7.0)
Eosinophils Absolute: 0.1 10*3/uL (ref 0.0–0.5)
HCT: 36.6 % (ref 34.8–46.6)
HGB: 12.1 g/dL (ref 11.6–15.9)
LYMPH%: 8.6 % — ABNORMAL LOW (ref 14.0–49.7)
MCH: 27.3 pg (ref 25.1–34.0)
MCHC: 33.1 g/dL (ref 31.5–36.0)
MCV: 82.5 fL (ref 79.5–101.0)
MONO#: 0.7 10*3/uL (ref 0.1–0.9)
MONO%: 8.2 % (ref 0.0–14.0)
NEUT#: 6.9 10*3/uL — ABNORMAL HIGH (ref 1.5–6.5)
NEUT%: 80.9 % — ABNORMAL HIGH (ref 38.4–76.8)
Platelets: 164 10*3/uL (ref 145–400)
RBC: 4.44 10*6/uL (ref 3.70–5.45)
RDW: 14.9 % — ABNORMAL HIGH (ref 11.2–14.5)
WBC: 8.5 10*3/uL (ref 3.9–10.3)
lymph#: 0.7 10*3/uL — ABNORMAL LOW (ref 0.9–3.3)

## 2016-12-16 LAB — COMPREHENSIVE METABOLIC PANEL
ALT: 10 U/L (ref 0–55)
AST: 9 U/L (ref 5–34)
Albumin: 3.7 g/dL (ref 3.5–5.0)
Alkaline Phosphatase: 76 U/L (ref 40–150)
Anion Gap: 7 mEq/L (ref 3–11)
BUN: 15.9 mg/dL (ref 7.0–26.0)
CO2: 33 mEq/L — ABNORMAL HIGH (ref 22–29)
Calcium: 9.5 mg/dL (ref 8.4–10.4)
Chloride: 101 mEq/L (ref 98–109)
Creatinine: 1.1 mg/dL (ref 0.6–1.1)
EGFR: 50 mL/min/{1.73_m2} — ABNORMAL LOW (ref >90)
Glucose: 100 mg/dl (ref 70–140)
Potassium: 3.8 mEq/L (ref 3.5–5.1)
Sodium: 141 mEq/L (ref 136–145)
Total Bilirubin: 0.57 mg/dL (ref 0.20–1.20)
Total Protein: 6.1 g/dL — ABNORMAL LOW (ref 6.4–8.3)

## 2016-12-16 LAB — TECHNOLOGIST REVIEW

## 2016-12-16 NOTE — Telephone Encounter (Signed)
Prescription has been shipped to pt -[place]

## 2016-12-17 ENCOUNTER — Encounter: Payer: Self-pay | Admitting: Hematology and Oncology

## 2016-12-17 DIAGNOSIS — R296 Repeated falls: Secondary | ICD-10-CM | POA: Diagnosis not present

## 2016-12-17 DIAGNOSIS — M6281 Muscle weakness (generalized): Secondary | ICD-10-CM | POA: Diagnosis not present

## 2016-12-17 NOTE — Assessment & Plan Note (Signed)
she will continue current medical management. I recommend close follow-up with primary care doctor for medication adjustment.  

## 2016-12-17 NOTE — Assessment & Plan Note (Signed)
She had excellent response to treatment. Last CT scan showed complete response. Her white blood cell count has normalized. I will continue current dosage of chemotherapy with the adjustment.  I will see her back in 4 months for further assessment, with blood work, history and physical examination. We discussed the role of imaging study.  Due to lack of changes, I recommend we defer imaging study to the future to be done only if she has signs suspicious for disease relapse   

## 2016-12-17 NOTE — Progress Notes (Signed)
[place] OFFICE PROGRESS NOTE  Patient Care Team: Jonathon Jordan, MD as PCP - General (Family Medicine)  SUMMARY OF ONCOLOGIC HISTORY:   CLL (chronic lymphocytic leukemia) ([place])   07/02/2010 Procedure    LN biopsy confirmed CLL      08/18/2010 Procedure    Bone marrow biopsy confirmed CLL      11/18/2010 - 04/15/2011 Chemotherapy    patient received 6 cycles of FLudarabine and Rituximab      01/13/2012 Relapse/Recurrence    Repeat BM biopsy confirmed relapse      01/27/2012 - 06/23/2012 Chemotherapy    Bendamustine & Rituximab given, complicated by infusion reaction to Rituximab, completed 6 cycles      06/29/2013 Imaging    Ct scan confirmed disease relapse with bulky lymphadenopathy      07/23/2013 Procedure    Repeat BM biopsy due to thrombocytopenia and progressive leukocytosis      08/02/2013 Relapse/Recurrence    Patient consented to start iburitinib as salvage therapy for relapsed CLL      11/09/2013 Imaging    Repeat CT scan the chest, abdomen and pelvis showed greater than 50% response to treatment      06/17/2014 Imaging    Repeat CT scan showed near complete response to treatment.      05/30/2015 Imaging    Repeat CT scan showed near complete response to treatment.      06/14/2016 Imaging    CT scan showed stable scattered small retroperitoneal lymph nodes and pelvic lymph nodes. No findings for recurrent or progressive lymphoma. Stable mild splenomegaly.       INTERVAL HISTORY: Please see below for problem oriented charting. She is here accompanied by her caregiver. She is doing very well. No recent infection. No recent new lymphadenopathy. She is compliant taking her chemotherapy as prescribed  REVIEW OF SYSTEMS:   Constitutional: Denies fevers, chills or abnormal weight loss Eyes: Denies blurriness of vision Ears, nose, mouth, throat, and face: Denies mucositis or sore throat Respiratory: Denies cough, dyspnea or  wheezes Cardiovascular: Denies palpitation, chest discomfort or lower extremity swelling Gastrointestinal:  Denies nausea, heartburn or change in bowel habits Skin: Denies abnormal skin rashes Lymphatics: Denies new lymphadenopathy or easy bruising Neurological:Denies numbness, tingling or new weaknesses Behavioral/Psych: Mood is stable, no new changes  All other systems were reviewed with the patient and are negative.  I have reviewed the past medical history, past surgical history, social history and family history with the patient and they are unchanged from previous note.  ALLERGIES:  is allergic to azithromycin and ibuprofen [ibuprofen].  MEDICATIONS:  Current Outpatient Prescriptions  Medication Sig Dispense Refill  . amLODipine (NORVASC) 10 MG tablet TAKE 1 TABLET (10 MG TOTAL) BY MOUTH DAILY. 90 tablet 1  . Carboxymethylcellul-Glycerin (REFRESH OPTIVE SENSITIVE OP) Place 1 drop into both eyes as needed.     . citalopram (CELEXA) 20 MG tablet Take 20 mg by mouth every morning.     . ibrutinib (IMBRUVICA) 140 MG capsul Take 3 capsules (420 mg total) by mouth daily. 90 capsule 11  . losartan-hydrochlorothiazide (HYZAAR) 100-12.5 MG per tablet Take 1 tablet by mouth every morning.     . Multiple Vitamins-Minerals (PRESERVISION AREDS 2) CAPS Take 1 capsule by mouth 2 (two) times daily.     No current facility-administered medications for this visit.     PHYSICAL EXAMINATION: ECOG PERFORMANCE STATUS: 0 - Asymptomatic  Vitals:   12/16/16 1105  BP: (!) 163/68  Pulse: 88  Resp:  18  Temp: 98.2 F (36.8 C)   Filed Weights   12/16/16 1105  Weight: 287 lb (130.2 kg)    GENERAL:alert, no distress and comfortable.  She is morbidly obese SKIN: skin color, texture, turgor are normal, no rashes or significant lesions EYES: normal, Conjunctiva are pink and non-injected, sclera clear OROPHARYNX:no exudate, no erythema and lips, buccal mucosa, and tongue normal  NECK: supple, thyroid  normal size, non-tender, without nodularity LYMPH:  no palpable lymphadenopathy in the cervical, axillary or inguinal LUNGS: clear to auscultation and percussion with normal breathing effort HEART: regular rate & rhythm and no murmurs and no lower extremity edema ABDOMEN:abdomen soft, non-tender and normal bowel sounds Musculoskeletal:no cyanosis of digits and no clubbing  NEURO: alert & oriented x 3 with fluent speech, no focal motor/sensory deficits  LABORATORY DATA:  I have reviewed the data as listed    Component Value Date/Time   NA 141 12/16/2016 1045   K 3.8 12/16/2016 1045   CL 98 02/02/2013 1353   CO2 33 (H) 12/16/2016 1045   GLUCOSE 100 12/16/2016 1045   GLUCOSE 99 02/02/2013 1353   BUN 15.9 12/16/2016 1045   CREATININE 1.1 12/16/2016 1045   CALCIUM 9.5 12/16/2016 1045   PROT 6.1 (L) 12/16/2016 1045   ALBUMIN 3.7 12/16/2016 1045   AST 9 12/16/2016 1045   ALT 10 12/16/2016 1045   ALKPHOS 76 12/16/2016 1045   BILITOT 0.57 12/16/2016 1045   GFRNONAA 56 (L) 08/31/2010 1429   GFRAA  08/31/2010 1429    >60        The eGFR has been calculated using the MDRD equation. This calculation has not been validated in all clinical situations. eGFR's persistently <60 mL/min signify possible Chronic Kidney Disease.    No results found for: SPEP, UPEP  Lab Results  Component Value Date   WBC 8.5 12/16/2016   NEUTROABS 6.9 (H) 12/16/2016   HGB 12.1 12/16/2016   HCT 36.6 12/16/2016   MCV 82.5 12/16/2016   PLT 164 12/16/2016      Chemistry      Component Value Date/Time   NA 141 12/16/2016 1045   K 3.8 12/16/2016 1045   CL 98 02/02/2013 1353   CO2 33 (H) 12/16/2016 1045   BUN 15.9 12/16/2016 1045   CREATININE 1.1 12/16/2016 1045      Component Value Date/Time   CALCIUM 9.5 12/16/2016 1045   ALKPHOS 76 12/16/2016 1045   AST 9 12/16/2016 1045   ALT 10 12/16/2016 1045   BILITOT 0.57 12/16/2016 1045      ASSESSMENT & PLAN:  CLL (chronic lymphocytic leukemia)  ([place]) She had excellent response to treatment. Last CT scan showed complete response. Her white blood cell count has normalized. I will continue current dosage of chemotherapy with the adjustment.  I will see her back in 4 months for further assessment, with blood work, history and physical examination. We discussed the role of imaging study.  Due to lack of changes, I recommend we defer imaging study to the future to be done only if she has signs suspicious for disease relapse    Essential hypertension she will continue current medical management. I recommend close follow-up with primary care doctor for medication adjustment.   Morbid obesity due to excess calories [facility]) The patient has progressive weight gain. She has been educated about portion control, eating healthy and continues to exercise regularly   Orders Placed This Encounter  Procedures  . CBC with Differential/Platelet    Standing  Status:   Future    Standing Expiration Date:   01/20/2018  . Comprehensive metabolic panel    Standing Status:   Future    Standing Expiration Date:   01/20/2018   All questions were answered. The patient knows to call the clinic with any problems, questions or concerns. No barriers to learning was detected. I spent 15 minutes counseling the patient face to face. The total time spent in the appointment was 20 minutes and more than 50% was on counseling and review of test results     Heath Lark, MD 12/17/2016 6:27 AM

## 2016-12-17 NOTE — Assessment & Plan Note (Signed)
The patient has progressive weight gain. She has been educated about portion control, eating healthy and continues to exercise regularly 

## 2017-01-10 ENCOUNTER — Other Ambulatory Visit: Payer: Self-pay | Admitting: *Deleted

## 2017-01-10 DIAGNOSIS — C9110 Chronic lymphocytic leukemia of B-cell type not having achieved remission: Secondary | ICD-10-CM

## 2017-01-10 DIAGNOSIS — C9190 Lymphoid leukemia, unspecified not having achieved remission: Secondary | ICD-10-CM

## 2017-01-10 MED ORDER — IBRUTINIB 140 MG PO TABS: ORAL_TABLET | ORAL | 11 refills | Status: DC

## 2017-01-11 DIAGNOSIS — R2681 Unsteadiness on feet: Secondary | ICD-10-CM | POA: Diagnosis not present

## 2017-01-11 DIAGNOSIS — M6281 Muscle weakness (generalized): Secondary | ICD-10-CM | POA: Diagnosis not present

## 2017-01-12 DIAGNOSIS — R296 Repeated falls: Secondary | ICD-10-CM | POA: Diagnosis not present

## 2017-01-13 ENCOUNTER — Other Ambulatory Visit: Payer: Self-pay | Admitting: Medical Oncology

## 2017-01-13 NOTE — Progress Notes (Signed)
Faxed refill for Imbruvica to [facility]/ [facility]

## 2017-01-14 ENCOUNTER — Other Ambulatory Visit: Payer: Self-pay | Admitting: *Deleted

## 2017-01-14 MED ORDER — IBRUTINIB 140 MG PO TABS: ORAL_TABLET | ORAL | 11 refills | Status: DC

## 2017-01-15 DIAGNOSIS — R296 Repeated falls: Secondary | ICD-10-CM | POA: Diagnosis not present

## 2017-01-18 DIAGNOSIS — R296 Repeated falls: Secondary | ICD-10-CM | POA: Diagnosis not present

## 2017-01-20 DIAGNOSIS — R296 Repeated falls: Secondary | ICD-10-CM | POA: Diagnosis not present

## 2017-01-21 DIAGNOSIS — R296 Repeated falls: Secondary | ICD-10-CM | POA: Diagnosis not present

## 2017-01-24 ENCOUNTER — Encounter: Payer: Self-pay | Admitting: *Deleted

## 2017-01-24 DIAGNOSIS — R296 Repeated falls: Secondary | ICD-10-CM | POA: Diagnosis not present

## 2017-01-26 DIAGNOSIS — R296 Repeated falls: Secondary | ICD-10-CM | POA: Diagnosis not present

## 2017-01-28 DIAGNOSIS — R296 Repeated falls: Secondary | ICD-10-CM | POA: Diagnosis not present

## 2017-01-31 DIAGNOSIS — R296 Repeated falls: Secondary | ICD-10-CM | POA: Diagnosis not present

## 2017-02-01 DIAGNOSIS — R296 Repeated falls: Secondary | ICD-10-CM | POA: Diagnosis not present

## 2017-02-03 DIAGNOSIS — R296 Repeated falls: Secondary | ICD-10-CM | POA: Diagnosis not present

## 2017-02-08 DIAGNOSIS — R296 Repeated falls: Secondary | ICD-10-CM | POA: Diagnosis not present

## 2017-02-09 DIAGNOSIS — R296 Repeated falls: Secondary | ICD-10-CM | POA: Diagnosis not present

## 2017-02-11 DIAGNOSIS — R296 Repeated falls: Secondary | ICD-10-CM | POA: Diagnosis not present

## 2017-02-15 ENCOUNTER — Other Ambulatory Visit: Payer: Self-pay | Admitting: Hematology and Oncology

## 2017-02-15 DIAGNOSIS — R296 Repeated falls: Secondary | ICD-10-CM | POA: Diagnosis not present

## 2017-02-15 DIAGNOSIS — Z1231 Encounter for screening mammogram for malignant neoplasm of breast: Secondary | ICD-10-CM

## 2017-02-16 DIAGNOSIS — R296 Repeated falls: Secondary | ICD-10-CM | POA: Diagnosis not present

## 2017-02-17 DIAGNOSIS — R296 Repeated falls: Secondary | ICD-10-CM | POA: Diagnosis not present

## 2017-02-22 DIAGNOSIS — R296 Repeated falls: Secondary | ICD-10-CM | POA: Diagnosis not present

## 2017-02-23 DIAGNOSIS — R296 Repeated falls: Secondary | ICD-10-CM | POA: Diagnosis not present

## 2017-02-24 ENCOUNTER — Telehealth: Payer: Self-pay | Admitting: Hematology and Oncology

## 2017-02-24 DIAGNOSIS — R296 Repeated falls: Secondary | ICD-10-CM | POA: Diagnosis not present

## 2017-02-24 NOTE — Telephone Encounter (Signed)
Left message on VM for Niece (only number) and a call back number for questions or concerns

## 2017-02-28 DIAGNOSIS — R296 Repeated falls: Secondary | ICD-10-CM | POA: Diagnosis not present

## 2017-03-01 DIAGNOSIS — R296 Repeated falls: Secondary | ICD-10-CM | POA: Diagnosis not present

## 2017-03-03 DIAGNOSIS — R296 Repeated falls: Secondary | ICD-10-CM | POA: Diagnosis not present

## 2017-03-07 DIAGNOSIS — R296 Repeated falls: Secondary | ICD-10-CM | POA: Diagnosis not present

## 2017-03-08 DIAGNOSIS — R296 Repeated falls: Secondary | ICD-10-CM | POA: Diagnosis not present

## 2017-03-10 DIAGNOSIS — R296 Repeated falls: Secondary | ICD-10-CM | POA: Diagnosis not present

## 2017-03-15 DIAGNOSIS — R296 Repeated falls: Secondary | ICD-10-CM | POA: Diagnosis not present

## 2017-03-17 DIAGNOSIS — R296 Repeated falls: Secondary | ICD-10-CM | POA: Diagnosis not present

## 2017-03-22 DIAGNOSIS — R296 Repeated falls: Secondary | ICD-10-CM | POA: Diagnosis not present

## 2017-03-23 DIAGNOSIS — R296 Repeated falls: Secondary | ICD-10-CM | POA: Diagnosis not present

## 2017-03-24 DIAGNOSIS — R296 Repeated falls: Secondary | ICD-10-CM | POA: Diagnosis not present

## 2017-03-28 ENCOUNTER — Ambulatory Visit
Admission: RE | Admit: 2017-03-28 | Discharge: 2017-03-28 | Disposition: A | Discharge status: Home / Self Care | Payer: Medicare Other | Source: Ambulatory Visit | Attending: Hematology and Oncology | Admitting: Hematology and Oncology

## 2017-03-28 DIAGNOSIS — R296 Repeated falls: Secondary | ICD-10-CM | POA: Diagnosis not present

## 2017-03-28 DIAGNOSIS — Z1231 Encounter for screening mammogram for malignant neoplasm of breast: Secondary | ICD-10-CM

## 2017-03-29 DIAGNOSIS — R296 Repeated falls: Secondary | ICD-10-CM | POA: Diagnosis not present

## 2017-03-31 DIAGNOSIS — R296 Repeated falls: Secondary | ICD-10-CM | POA: Diagnosis not present

## 2017-04-04 DIAGNOSIS — R296 Repeated falls: Secondary | ICD-10-CM | POA: Diagnosis not present

## 2017-04-05 DIAGNOSIS — R296 Repeated falls: Secondary | ICD-10-CM | POA: Diagnosis not present

## 2017-04-07 DIAGNOSIS — R296 Repeated falls: Secondary | ICD-10-CM | POA: Diagnosis not present

## 2017-04-11 ENCOUNTER — Other Ambulatory Visit: Payer: Self-pay | Admitting: Hematology and Oncology

## 2017-04-11 ENCOUNTER — Other Ambulatory Visit: Payer: Self-pay

## 2017-04-11 ENCOUNTER — Telehealth: Payer: Self-pay

## 2017-04-11 DIAGNOSIS — R296 Repeated falls: Secondary | ICD-10-CM | POA: Diagnosis not present

## 2017-04-11 MED ORDER — IBRUTINIB 140 MG PO CAPS: 420.0000 mg | ORAL_CAPSULE | Freq: Every day | ORAL | 11 refills | Status: DC

## 2017-04-11 MED ORDER — IBRUTINIB 420 MG PO TABS: 420.0000 mg | ORAL_TABLET | Freq: Every day | ORAL | 11 refills | Status: DC

## 2017-04-11 NOTE — Telephone Encounter (Signed)
Called Olivia Mackie and told her [facility] has new Rx.

## 2017-04-11 NOTE — Telephone Encounter (Signed)
Olivia Mackie patients niece called and left message.  Imbruvica manufacturer has changed from tablets to capsules. They need a new Rx for Imbruvica for capsules sent to [facility] and sent to [facility].

## 2017-04-11 NOTE — Telephone Encounter (Signed)
I sent electronic refill to [facility] Please call/check later make sure they can work on the prescription

## 2017-04-12 DIAGNOSIS — R296 Repeated falls: Secondary | ICD-10-CM | POA: Diagnosis not present

## 2017-04-14 DIAGNOSIS — R296 Repeated falls: Secondary | ICD-10-CM | POA: Diagnosis not present

## 2017-04-19 DIAGNOSIS — R296 Repeated falls: Secondary | ICD-10-CM | POA: Diagnosis not present

## 2017-04-22 ENCOUNTER — Encounter: Payer: Self-pay | Admitting: Hematology and Oncology

## 2017-04-22 ENCOUNTER — Telehealth: Payer: Self-pay | Admitting: Hematology and Oncology

## 2017-04-22 ENCOUNTER — Ambulatory Visit (HOSPITAL_BASED_OUTPATIENT_CLINIC_OR_DEPARTMENT_OTHER): Payer: Medicare Other | Admitting: Hematology and Oncology

## 2017-04-22 ENCOUNTER — Other Ambulatory Visit (HOSPITAL_BASED_OUTPATIENT_CLINIC_OR_DEPARTMENT_OTHER): Payer: Medicare Other

## 2017-04-22 DIAGNOSIS — I10 Essential (primary) hypertension: Secondary | ICD-10-CM

## 2017-04-22 DIAGNOSIS — C9110 Chronic lymphocytic leukemia of B-cell type not having achieved remission: Secondary | ICD-10-CM | POA: Diagnosis not present

## 2017-04-22 LAB — CBC WITH DIFFERENTIAL/PLATELET
BASO%: 0.6 % (ref 0.0–2.0)
Basophils Absolute: 0.1 10*3/uL (ref 0.0–0.1)
EOS%: 1.9 % (ref 0.0–7.0)
Eosinophils Absolute: 0.2 10*3/uL (ref 0.0–0.5)
HCT: 37.7 % (ref 34.8–46.6)
HGB: 12.1 g/dL (ref 11.6–15.9)
LYMPH%: 14 % (ref 14.0–49.7)
MCH: 27.1 pg (ref 25.1–34.0)
MCHC: 32.1 g/dL (ref 31.5–36.0)
MCV: 84.5 fL (ref 79.5–101.0)
MONO#: 0.7 10*3/uL (ref 0.1–0.9)
MONO%: 8.2 % (ref 0.0–14.0)
NEUT#: 6.8 10*3/uL — ABNORMAL HIGH (ref 1.5–6.5)
NEUT%: 75.3 % (ref 38.4–76.8)
Platelets: 162 10*3/uL (ref 145–400)
RBC: 4.46 10*6/uL (ref 3.70–5.45)
RDW: 13.9 % (ref 11.2–14.5)
WBC: 9 10*3/uL (ref 3.9–10.3)
lymph#: 1.3 10*3/uL (ref 0.9–3.3)

## 2017-04-22 LAB — COMPREHENSIVE METABOLIC PANEL
ALT: 13 U/L (ref 0–55)
AST: 11 U/L (ref 5–34)
Albumin: 3.9 g/dL (ref 3.5–5.0)
Alkaline Phosphatase: 72 U/L (ref 40–150)
Anion Gap: 9 mEq/L (ref 3–11)
BUN: 16.6 mg/dL (ref 7.0–26.0)
CO2: 31 mEq/L — ABNORMAL HIGH (ref 22–29)
Calcium: 9.9 mg/dL (ref 8.4–10.4)
Chloride: 101 mEq/L (ref 98–109)
Creatinine: 1 mg/dL (ref 0.6–1.1)
EGFR: 55 mL/min/{1.73_m2} — ABNORMAL LOW (ref >90)
Glucose: 96 mg/dl (ref 70–140)
Potassium: 3.8 mEq/L (ref 3.5–5.1)
Sodium: 141 mEq/L (ref 136–145)
Total Bilirubin: 0.67 mg/dL (ref 0.20–1.20)
Total Protein: 6.2 g/dL — ABNORMAL LOW (ref 6.4–8.3)

## 2017-04-22 LAB — TECHNOLOGIST REVIEW

## 2017-04-22 NOTE — Assessment & Plan Note (Signed)
She had excellent response to treatment. Last CT scan showed complete response. Her white blood cell count has normalized. I will continue current dosage of chemotherapy with the adjustment.  I will see her back in 4 months for further assessment, with blood work, history and physical examination. We discussed the role of imaging study.  Due to lack of changes, I recommend we defer imaging study to the future to be done only if she has signs suspicious for disease relapse

## 2017-04-22 NOTE — Assessment & Plan Note (Signed)
She has poorly controlled hypertension I wrote her a letter to give it to her caregivers I recommend spacing out amlodipine to be given in the morning and Hyzaar in the evening I also recommend close documentation of blood pressure at least twice a day. I have instructed family members to reach out to her primary care doctor to consider medication adjustment

## 2017-04-22 NOTE — Telephone Encounter (Signed)
Scheduled appt per 7/6 los - Gave patient AVS and calender per los.  

## 2017-04-22 NOTE — Progress Notes (Signed)
[place] OFFICE PROGRESS NOTE  Patient Care Team: Jonathon Jordan, MD as PCP - General (Family Medicine)  SUMMARY OF ONCOLOGIC HISTORY:   CLL (chronic lymphocytic leukemia) ([place])   07/02/2010 Procedure    LN biopsy confirmed CLL      08/18/2010 Procedure    Bone marrow biopsy confirmed CLL      11/18/2010 - 04/15/2011 Chemotherapy    patient received 6 cycles of FLudarabine and Rituximab      01/13/2012 Relapse/Recurrence    Repeat BM biopsy confirmed relapse      01/27/2012 - 06/23/2012 Chemotherapy    Bendamustine & Rituximab given, complicated by infusion reaction to Rituximab, completed 6 cycles      06/29/2013 Imaging    Ct scan confirmed disease relapse with bulky lymphadenopathy      07/23/2013 Procedure    Repeat BM biopsy due to thrombocytopenia and progressive leukocytosis      08/02/2013 Relapse/Recurrence    Patient consented to start iburitinib as salvage therapy for relapsed CLL      11/09/2013 Imaging    Repeat CT scan the chest, abdomen and pelvis showed greater than 50% response to treatment      06/17/2014 Imaging    Repeat CT scan showed near complete response to treatment.      05/30/2015 Imaging    Repeat CT scan showed near complete response to treatment.      06/14/2016 Imaging    CT scan showed stable scattered small retroperitoneal lymph nodes and pelvic lymph nodes. No findings for recurrent or progressive lymphoma. Stable mild splenomegaly.       INTERVAL HISTORY: Please see below for problem oriented charting. She returns for further follow-up She is accompanied by her niece who is her healthcare medical power of attorney She was able to get her medications refilled without major problems She is noted to have progressive weight gain and poorly controlled hypertension She continues to bruise easily but denies palpitation, irregular heartbeat or diarrhea No recent infection No new lymphadenopathy  REVIEW OF SYSTEMS:    Constitutional: Denies fevers, chills or abnormal weight loss Eyes: Denies blurriness of vision Ears, nose, mouth, throat, and face: Denies mucositis or sore throat Respiratory: Denies cough, dyspnea or wheezes Cardiovascular: Denies palpitation, chest discomfort or lower extremity swelling Gastrointestinal:  Denies nausea, heartburn or change in bowel habits Skin: Denies abnormal skin rashes Lymphatics: Denies new lymphadenopathy  Neurological:Denies numbness, tingling or new weaknesses Behavioral/Psych: Mood is stable, no new changes  All other systems were reviewed with the patient and are negative.  I have reviewed the past medical history, past surgical history, social history and family history with the patient and they are unchanged from previous note.  ALLERGIES:  is allergic to azithromycin and ibuprofen [ibuprofen].  MEDICATIONS:  Current Outpatient Prescriptions  Medication Sig Dispense Refill  . amLODipine (NORVASC) 10 MG tablet TAKE 1 TABLET (10 MG TOTAL) BY MOUTH DAILY. 90 tablet 1  . Carboxymethylcellul-Glycerin (REFRESH OPTIVE SENSITIVE OP) Place 1 drop into both eyes as needed.     . citalopram (CELEXA) 20 MG tablet Take 20 mg by mouth every morning.     . ibrutinib (IMBRUVICA) 140 MG capsul Take 3 capsules (420 mg total) by mouth daily. 90 capsule 11  . losartan-hydrochlorothiazide (HYZAAR) 100-12.5 MG per tablet Take 1 tablet by mouth every morning.     . Multiple Vitamins-Minerals (PRESERVISION AREDS 2) CAPS Take 1 capsule by mouth 2 (two) times daily.     No current facility-administered  medications for this visit.     PHYSICAL EXAMINATION: ECOG PERFORMANCE STATUS: 1 - Symptomatic but completely ambulatory  Vitals:   04/22/17 1034  BP: (!) 152/63  Pulse: 84  Resp: 18  Temp: 98.4 F (36.9 C)   Filed Weights   04/22/17 1034  Weight: 293 lb 14.4 oz (133.3 kg)    GENERAL:alert, no distress and comfortable.  She is morbidly obese SKIN: skin color,  texture, turgor are normal, no rashes or significant lesions EYES: normal, Conjunctiva are pink and non-injected, sclera clear OROPHARYNX:no exudate, no erythema and lips, buccal mucosa, and tongue normal  NECK: supple, thyroid normal size, non-tender, without nodularity LYMPH:  no palpable lymphadenopathy in the cervical, axillary or inguinal LUNGS: clear to auscultation and percussion with normal breathing effort HEART: regular rate & rhythm and no murmurs and no lower extremity edema ABDOMEN:abdomen soft, non-tender and normal bowel sounds Musculoskeletal:no cyanosis of digits and no clubbing  NEURO: alert & oriented x 3 with fluent speech, no focal motor/sensory deficits  LABORATORY DATA:  I have reviewed the data as listed    Component Value Date/Time   NA 141 04/22/2017 1017   K 3.8 04/22/2017 1017   CL 98 02/02/2013 1353   CO2 31 (H) 04/22/2017 1017   GLUCOSE 96 04/22/2017 1017   GLUCOSE 99 02/02/2013 1353   BUN 16.6 04/22/2017 1017   CREATININE 1.0 04/22/2017 1017   CALCIUM 9.9 04/22/2017 1017   PROT 6.2 (L) 04/22/2017 1017   ALBUMIN 3.9 04/22/2017 1017   AST 11 04/22/2017 1017   ALT 13 04/22/2017 1017   ALKPHOS 72 04/22/2017 1017   BILITOT 0.67 04/22/2017 1017   GFRNONAA 56 (L) 08/31/2010 1429   GFRAA  08/31/2010 1429    >60        The eGFR has been calculated using the MDRD equation. This calculation has not been validated in all clinical situations. eGFR's persistently <60 mL/min signify possible Chronic Kidney Disease.    No results found for: SPEP, UPEP  Lab Results  Component Value Date   WBC 9.0 04/22/2017   NEUTROABS 6.8 (H) 04/22/2017   HGB 12.1 04/22/2017   HCT 37.7 04/22/2017   MCV 84.5 04/22/2017   PLT 162 04/22/2017      Chemistry      Component Value Date/Time   NA 141 04/22/2017 1017   K 3.8 04/22/2017 1017   CL 98 02/02/2013 1353   CO2 31 (H) 04/22/2017 1017   BUN 16.6 04/22/2017 1017   CREATININE 1.0 04/22/2017 1017       Component Value Date/Time   CALCIUM 9.9 04/22/2017 1017   ALKPHOS 72 04/22/2017 1017   AST 11 04/22/2017 1017   ALT 13 04/22/2017 1017   BILITOT 0.67 04/22/2017 1017       RADIOGRAPHIC STUDIES: I have personally reviewed the radiological images as listed and agreed with the findings in the report. Mm Digital Screening Bilateral  Result Date: 03/28/2017 CLINICAL DATA:  Screening. EXAM: DIGITAL SCREENING BILATERAL MAMMOGRAM WITH CAD COMPARISON:  Previous exam(s). ACR Breast Density Category a: The breast tissue is almost entirely fatty. FINDINGS: There are no findings suspicious for malignancy. Images were processed with CAD. IMPRESSION: No mammographic evidence of malignancy. A result letter of this screening mammogram will be mailed directly to the patient. RECOMMENDATION: Screening mammogram in one year. (Code:SM-B-01Y) BI-RADS CATEGORY  1: Negative. Electronically Signed   By: Stephanie  Morgan M.D.   On: 03/28/2017 14:54    ASSESSMENT & PLAN:  CLL (chronic   lymphocytic leukemia) ([facility]) She had excellent response to treatment. Last CT scan showed complete response. Her white blood cell count has normalized. I will continue current dosage of chemotherapy with the adjustment.  I will see her back in 4 months for further assessment, with blood work, history and physical examination. We discussed the role of imaging study.  Due to lack of changes, I recommend we defer imaging study to the future to be done only if she has signs suspicious for disease relapse    Essential hypertension She has poorly controlled hypertension I wrote her a letter to give it to her caregivers I recommend spacing out amlodipine to be given in the morning and Hyzaar in the evening I also recommend close documentation of blood pressure at least twice a day. I have instructed family members to reach out to her primary care doctor to consider medication adjustment  Morbid obesity due to excess calories  ([facility]) The patient has progressive weight gain. She has been educated about portion control, eating healthy and continues to exercise regularly   No orders of the defined types were placed in this encounter.  All questions were answered. The patient knows to call the clinic with any problems, questions or concerns. No barriers to learning was detected. I spent 15 minutes counseling the patient face to face. The total time spent in the appointment was 20 minutes and more than 50% was on counseling and review of test results     Ni Gorsuch, MD 04/22/2017 4:53 PM  

## 2017-04-22 NOTE — Assessment & Plan Note (Signed)
The patient has progressive weight gain. She has been educated about portion control, eating healthy and continues to exercise regularly 

## 2017-05-10 DIAGNOSIS — I10 Essential (primary) hypertension: Secondary | ICD-10-CM | POA: Diagnosis not present

## 2017-05-31 DIAGNOSIS — H353132 Nonexudative age-related macular degeneration, bilateral, intermediate dry stage: Secondary | ICD-10-CM | POA: Diagnosis not present

## 2017-05-31 DIAGNOSIS — H25013 Cortical age-related cataract, bilateral: Secondary | ICD-10-CM | POA: Diagnosis not present

## 2017-05-31 DIAGNOSIS — H35373 Puckering of macula, bilateral: Secondary | ICD-10-CM | POA: Diagnosis not present

## 2017-05-31 DIAGNOSIS — H2513 Age-related nuclear cataract, bilateral: Secondary | ICD-10-CM | POA: Diagnosis not present

## 2017-06-03 DIAGNOSIS — I10 Essential (primary) hypertension: Secondary | ICD-10-CM | POA: Diagnosis not present

## 2017-06-23 DIAGNOSIS — Z0000 Encounter for general adult medical examination without abnormal findings: Secondary | ICD-10-CM | POA: Diagnosis not present

## 2017-06-23 DIAGNOSIS — I10 Essential (primary) hypertension: Secondary | ICD-10-CM | POA: Diagnosis not present

## 2017-06-23 DIAGNOSIS — E782 Mixed hyperlipidemia: Secondary | ICD-10-CM | POA: Diagnosis not present

## 2017-06-23 DIAGNOSIS — E559 Vitamin D deficiency, unspecified: Secondary | ICD-10-CM | POA: Diagnosis not present

## 2017-06-28 ENCOUNTER — Telehealth: Payer: Self-pay | Admitting: *Deleted

## 2017-06-28 NOTE — Telephone Encounter (Signed)
Spoke with niece regarding labs from Dr Stephanie Acre' office. All labs are being address

## 2017-06-30 DIAGNOSIS — R946 Abnormal results of thyroid function studies: Secondary | ICD-10-CM | POA: Diagnosis not present

## 2017-08-12 DIAGNOSIS — I10 Essential (primary) hypertension: Secondary | ICD-10-CM | POA: Diagnosis not present

## 2017-08-22 ENCOUNTER — Other Ambulatory Visit: Payer: Self-pay | Admitting: Hematology and Oncology

## 2017-08-22 DIAGNOSIS — C9110 Chronic lymphocytic leukemia of B-cell type not having achieved remission: Secondary | ICD-10-CM

## 2017-08-23 ENCOUNTER — Ambulatory Visit (HOSPITAL_BASED_OUTPATIENT_CLINIC_OR_DEPARTMENT_OTHER): Payer: Medicare Other | Admitting: Hematology and Oncology

## 2017-08-23 ENCOUNTER — Other Ambulatory Visit (HOSPITAL_BASED_OUTPATIENT_CLINIC_OR_DEPARTMENT_OTHER): Payer: Medicare Other

## 2017-08-23 ENCOUNTER — Telehealth: Payer: Self-pay | Admitting: Hematology and Oncology

## 2017-08-23 ENCOUNTER — Encounter: Payer: Self-pay | Admitting: Hematology and Oncology

## 2017-08-23 DIAGNOSIS — C9110 Chronic lymphocytic leukemia of B-cell type not having achieved remission: Secondary | ICD-10-CM

## 2017-08-23 DIAGNOSIS — I10 Essential (primary) hypertension: Secondary | ICD-10-CM

## 2017-08-23 LAB — CBC WITH DIFFERENTIAL/PLATELET
BASO%: 0.9 % (ref 0.0–2.0)
Basophils Absolute: 0.1 10*3/uL (ref 0.0–0.1)
EOS%: 1.6 % (ref 0.0–7.0)
Eosinophils Absolute: 0.2 10*3/uL (ref 0.0–0.5)
HCT: 37.2 % (ref 34.8–46.6)
HGB: 12.1 g/dL (ref 11.6–15.9)
LYMPH%: 26.1 % (ref 14.0–49.7)
MCH: 26.7 pg (ref 25.1–34.0)
MCHC: 32.5 g/dL (ref 31.5–36.0)
MCV: 82.2 fL (ref 79.5–101.0)
MONO#: 0.8 10*3/uL (ref 0.1–0.9)
MONO%: 8.1 % (ref 0.0–14.0)
NEUT#: 6.7 10*3/uL — ABNORMAL HIGH (ref 1.5–6.5)
NEUT%: 63.3 % (ref 38.4–76.8)
Platelets: 148 10*3/uL (ref 145–400)
RBC: 4.53 10*6/uL (ref 3.70–5.45)
RDW: 15.7 % — ABNORMAL HIGH (ref 11.2–14.5)
WBC: 10.5 10*3/uL — ABNORMAL HIGH (ref 3.9–10.3)
lymph#: 2.7 10*3/uL (ref 0.9–3.3)

## 2017-08-23 LAB — COMPREHENSIVE METABOLIC PANEL
ALT: 10 U/L (ref 0–55)
AST: 11 U/L (ref 5–34)
Albumin: 3.8 g/dL (ref 3.5–5.0)
Alkaline Phosphatase: 66 U/L (ref 40–150)
Anion Gap: 8 mEq/L (ref 3–11)
BUN: 21 mg/dL (ref 7.0–26.0)
CO2: 33 mEq/L — ABNORMAL HIGH (ref 22–29)
Calcium: 9.3 mg/dL (ref 8.4–10.4)
Chloride: 101 mEq/L (ref 98–109)
Creatinine: 1.2 mg/dL — ABNORMAL HIGH (ref 0.6–1.1)
EGFR: 47 mL/min/{1.73_m2} — ABNORMAL LOW (ref >60)
Glucose: 92 mg/dl (ref 70–140)
Potassium: 3.7 mEq/L (ref 3.5–5.1)
Sodium: 141 mEq/L (ref 136–145)
Total Bilirubin: 0.59 mg/dL (ref 0.20–1.20)
Total Protein: 6.3 g/dL — ABNORMAL LOW (ref 6.4–8.3)

## 2017-08-23 LAB — TECHNOLOGIST REVIEW: Technologist Review: 1

## 2017-08-23 NOTE — Assessment & Plan Note (Signed)
The patient has progressive weight gain. She has been educated about portion control, eating healthy and continues to exercise regularly

## 2017-08-23 NOTE — Assessment & Plan Note (Signed)
She has poorly controlled hypertension I do not believe the chemotherapy is the cause of this The most likely culprit is related to her morbid obesity Compared to 4 years ago, the patient has gained 60 pounds of weight I would defer to her primary care doctor for medical management

## 2017-08-23 NOTE — Assessment & Plan Note (Signed)
She had excellent response to treatment. Last CT scan in August 2017 showed complete response. Her white blood cell count has normalized. I will continue current dosage of chemotherapy with the adjustment.  I will see her back in 4 months for further assessment, with blood work, history and physical examination. We discussed the role of imaging study.  Due to lack of changes, I recommend we defer imaging study to the future to be done only if she has signs suspicious for disease relapse

## 2017-08-23 NOTE — Progress Notes (Signed)
[place] OFFICE PROGRESS NOTE  Patient Care Team: Jonathon Jordan, MD as PCP - General (Family Medicine)  SUMMARY OF ONCOLOGIC HISTORY:   CLL (chronic lymphocytic leukemia) ([place])   07/02/2010 Procedure    LN biopsy confirmed CLL      08/18/2010 Procedure    Bone marrow biopsy confirmed CLL      11/18/2010 - 04/15/2011 Chemotherapy    patient received 6 cycles of FLudarabine and Rituximab      01/13/2012 Relapse/Recurrence    Repeat BM biopsy confirmed relapse      01/27/2012 - 06/23/2012 Chemotherapy    Bendamustine & Rituximab given, complicated by infusion reaction to Rituximab, completed 6 cycles      06/29/2013 Imaging    Ct scan confirmed disease relapse with bulky lymphadenopathy      07/23/2013 Procedure    Repeat BM biopsy due to thrombocytopenia and progressive leukocytosis      08/02/2013 Relapse/Recurrence    Patient consented to start iburitinib as salvage therapy for relapsed CLL      11/09/2013 Imaging    Repeat CT scan the chest, abdomen and pelvis showed greater than 50% response to treatment      06/17/2014 Imaging    Repeat CT scan showed near complete response to treatment.      05/30/2015 Imaging    Repeat CT scan showed near complete response to treatment.      06/14/2016 Imaging    CT scan showed stable scattered small retroperitoneal lymph nodes and pelvic lymph nodes. No findings for recurrent or progressive lymphoma. Stable mild splenomegaly.       INTERVAL HISTORY: Please see below for problem oriented charting. She returns with her caregiver for further evaluation She denies new lymphadenopathy No recent infection Denies recent irregular heartbeat, excessive bruising or diarrhea Her caregiver noted that the patient have difficult to control hypertension Of note, the patient had progressive weight gain  REVIEW OF SYSTEMS:   Constitutional: Denies fevers, chills or abnormal weight loss Eyes: Denies blurriness of  vision Ears, nose, mouth, throat, and face: Denies mucositis or sore throat Respiratory: Denies cough, dyspnea or wheezes Cardiovascular: Denies palpitation, chest discomfort or lower extremity swelling Gastrointestinal:  Denies nausea, heartburn or change in bowel habits Skin: Denies abnormal skin rashes Lymphatics: Denies new lymphadenopathy or easy bruising Neurological:Denies numbness, tingling or new weaknesses Behavioral/Psych: Mood is stable, no new changes  All other systems were reviewed with the patient and are negative.  I have reviewed the past medical history, past surgical history, social history and family history with the patient and they are unchanged from previous note.  ALLERGIES:  is allergic to azithromycin and ibuprofen [ibuprofen].  MEDICATIONS:  Current Outpatient Medications  Medication Sig Dispense Refill  . amLODipine (NORVASC) 10 MG tablet TAKE 1 TABLET (10 MG TOTAL) BY MOUTH DAILY. 90 tablet 1  . bisoprolol (ZEBETA) 5 MG tablet   10  . Carboxymethylcellul-Glycerin (REFRESH OPTIVE SENSITIVE OP) Place 1 drop into both eyes as needed.     . citalopram (CELEXA) 20 MG tablet Take 20 mg by mouth every morning.     . ibrutinib (IMBRUVICA) 140 MG capsul Take 3 capsules (420 mg total) by mouth daily. 90 capsule 11  . loratadine (CLARITIN) 10 MG tablet Take 10 mg daily by mouth.    . losartan-hydrochlorothiazide (HYZAAR) 100-25 MG tablet   9  . Multiple Vitamins-Minerals (PRESERVISION AREDS 2) CAPS Take 1 capsule by mouth 2 (two) times daily.    . Vitamin D,  Ergocalciferol, 2000 units CAPS Take by mouth.     No current facility-administered medications for this visit.     PHYSICAL EXAMINATION: ECOG PERFORMANCE STATUS: 0 - Asymptomatic  Vitals:   08/23/17 1037  BP: (!) 171/85  Pulse: 66  Resp: 20  Temp: 98.8 F (37.1 C)  SpO2: 97%   Filed Weights   08/23/17 1037  Weight: 298 lb 9.6 oz (135.4 kg)    GENERAL:alert, no distress and comfortable.  She is  morbidly obese SKIN: skin color, texture, turgor are normal, no rashes or significant lesions EYES: normal, Conjunctiva are pink and non-injected, sclera clear OROPHARYNX:no exudate, no erythema and lips, buccal mucosa, and tongue normal  NECK: supple, thyroid normal size, non-tender, without nodularity LYMPH:  no palpable lymphadenopathy in the cervical, axillary or inguinal LUNGS: clear to auscultation and percussion with normal breathing effort HEART: regular rate & rhythm and no murmurs and no lower extremity edema ABDOMEN:abdomen soft, non-tender and normal bowel sounds Musculoskeletal:no cyanosis of digits and no clubbing  NEURO: alert & oriented x 3 with fluent speech, no focal motor/sensory deficits  LABORATORY DATA:  I have reviewed the data as listed    Component Value Date/Time   NA 141 08/23/2017 1026   K 3.7 08/23/2017 1026   CL 98 02/02/2013 1353   CO2 33 (H) 08/23/2017 1026   GLUCOSE 92 08/23/2017 1026   GLUCOSE 99 02/02/2013 1353   BUN 21.0 08/23/2017 1026   CREATININE 1.2 (H) 08/23/2017 1026   CALCIUM 9.3 08/23/2017 1026   PROT 6.3 (L) 08/23/2017 1026   ALBUMIN 3.8 08/23/2017 1026   AST 11 08/23/2017 1026   ALT 10 08/23/2017 1026   ALKPHOS 66 08/23/2017 1026   BILITOT 0.59 08/23/2017 1026   GFRNONAA 56 (L) 08/31/2010 1429   GFRAA  08/31/2010 1429    >60        The eGFR has been calculated using the MDRD equation. This calculation has not been validated in all clinical situations. eGFR's persistently <60 mL/min signify possible Chronic Kidney Disease.    No results found for: SPEP, UPEP  Lab Results  Component Value Date   WBC 10.5 (H) 08/23/2017   NEUTROABS 6.7 (H) 08/23/2017   HGB 12.1 08/23/2017   HCT 37.2 08/23/2017   MCV 82.2 08/23/2017   PLT 148 08/23/2017      Chemistry      Component Value Date/Time   NA 141 08/23/2017 1026   K 3.7 08/23/2017 1026   CL 98 02/02/2013 1353   CO2 33 (H) 08/23/2017 1026   BUN 21.0 08/23/2017 1026    CREATININE 1.2 (H) 08/23/2017 1026      Component Value Date/Time   CALCIUM 9.3 08/23/2017 1026   ALKPHOS 66 08/23/2017 1026   AST 11 08/23/2017 1026   ALT 10 08/23/2017 1026   BILITOT 0.59 08/23/2017 1026     ASSESSMENT & PLAN:  CLL (chronic lymphocytic leukemia) ([place]) She had excellent response to treatment. Last CT scan in August 2017 showed complete response. Her white blood cell count has normalized. I will continue current dosage of chemotherapy with the adjustment.  I will see her back in 4 months for further assessment, with blood work, history and physical examination. We discussed the role of imaging study.  Due to lack of changes, I recommend we defer imaging study to the future to be done only if she has signs suspicious for disease relapse    Essential hypertension She has poorly controlled hypertension I do not  believe the chemotherapy is the cause of this The most likely culprit is related to her morbid obesity Compared to 4 years ago, the patient has gained 60 pounds of weight I would defer to her primary care doctor for medical management  Morbid obesity due to excess calories [facility]) The patient has progressive weight gain. She has been educated about portion control, eating healthy and continues to exercise regularly   No orders of the defined types were placed in this encounter.  All questions were answered. The patient knows to call the clinic with any problems, questions or concerns. No barriers to learning was detected. I spent 15 minutes counseling the patient face to face. The total time spent in the appointment was 20 minutes and more than 50% was on counseling and review of test results     Heath Lark, MD 08/23/2017 5:27 PM

## 2017-08-23 NOTE — Telephone Encounter (Signed)
Gave avs and calendar for march 2019 

## 2017-12-22 ENCOUNTER — Telehealth: Payer: Self-pay | Admitting: Hematology and Oncology

## 2017-12-22 ENCOUNTER — Inpatient Hospital Stay: Payer: Medicare Other | Attending: Hematology and Oncology | Admitting: Hematology and Oncology

## 2017-12-22 ENCOUNTER — Other Ambulatory Visit: Payer: Self-pay | Admitting: Hematology and Oncology

## 2017-12-22 ENCOUNTER — Inpatient Hospital Stay: Payer: Medicare Other

## 2017-12-22 ENCOUNTER — Encounter: Payer: Self-pay | Admitting: Hematology and Oncology

## 2017-12-22 VITALS — BP 148/74 | HR 73 | Temp 98.3°F | Resp 18 | Ht 63.0 in | Wt 299.9 lb

## 2017-12-22 DIAGNOSIS — E790 Hyperuricemia without signs of inflammatory arthritis and tophaceous disease: Secondary | ICD-10-CM | POA: Insufficient documentation

## 2017-12-22 DIAGNOSIS — Z7189 Other specified counseling: Secondary | ICD-10-CM | POA: Diagnosis not present

## 2017-12-22 DIAGNOSIS — C9110 Chronic lymphocytic leukemia of B-cell type not having achieved remission: Secondary | ICD-10-CM | POA: Diagnosis not present

## 2017-12-22 DIAGNOSIS — D61818 Other pancytopenia: Secondary | ICD-10-CM | POA: Diagnosis not present

## 2017-12-22 DIAGNOSIS — I10 Essential (primary) hypertension: Secondary | ICD-10-CM | POA: Insufficient documentation

## 2017-12-22 LAB — COMPREHENSIVE METABOLIC PANEL
ALT: 15 U/L (ref 0–55)
AST: 10 U/L (ref 5–34)
Albumin: 3.7 g/dL (ref 3.5–5.0)
Alkaline Phosphatase: 74 U/L (ref 40–150)
Anion gap: 8 (ref 3–11)
BUN: 24 mg/dL (ref 7–26)
CO2: 34 mmol/L — ABNORMAL HIGH (ref 22–29)
Calcium: 9.8 mg/dL (ref 8.4–10.4)
Chloride: 100 mmol/L (ref 98–109)
Creatinine, Ser: 1.3 mg/dL — ABNORMAL HIGH (ref 0.60–1.10)
GFR calc Af Amer: 47 mL/min — ABNORMAL LOW (ref >60)
GFR calc non Af Amer: 40 mL/min — ABNORMAL LOW (ref >60)
Glucose, Bld: 99 mg/dL (ref 70–140)
Potassium: 3.6 mmol/L (ref 3.5–5.1)
Sodium: 142 mmol/L (ref 136–145)
Total Bilirubin: 0.5 mg/dL (ref 0.2–1.2)
Total Protein: 6.1 g/dL — ABNORMAL LOW (ref 6.4–8.3)

## 2017-12-22 LAB — CBC WITH DIFFERENTIAL/PLATELET
Basophils Absolute: 0.1 10*3/uL (ref 0.0–0.1)
Basophils Relative: 0 %
Eosinophils Absolute: 0.2 10*3/uL (ref 0.0–0.5)
Eosinophils Relative: 1 %
HCT: 36.4 % (ref 34.8–46.6)
Hemoglobin: 11.5 g/dL — ABNORMAL LOW (ref 11.6–15.9)
Lymphocytes Relative: 56 %
Lymphs Abs: 10.9 10*3/uL — ABNORMAL HIGH (ref 0.9–3.3)
MCH: 26.5 pg (ref 25.1–34.0)
MCHC: 31.6 g/dL (ref 31.5–36.0)
MCV: 83.9 fL (ref 79.5–101.0)
Monocytes Absolute: 0.7 10*3/uL (ref 0.1–0.9)
Monocytes Relative: 4 %
Neutro Abs: 7.5 10*3/uL — ABNORMAL HIGH (ref 1.5–6.5)
Neutrophils Relative %: 39 %
Other: 1 %
Platelets: 125 10*3/uL — ABNORMAL LOW (ref 145–400)
RBC: 4.34 MIL/uL (ref 3.70–5.45)
RDW: 15.3 % — ABNORMAL HIGH (ref 11.2–14.5)
WBC: 19.4 10*3/uL — ABNORMAL HIGH (ref 3.9–10.3)

## 2017-12-22 LAB — LACTATE DEHYDROGENASE: LDH: 182 U/L (ref 125–245)

## 2017-12-22 NOTE — Progress Notes (Signed)
[place] OFFICE PROGRESS NOTE  Patient Care Team: Jonathon Jordan, MD as PCP - General (Family Medicine)  ASSESSMENT & PLAN:  CLL (chronic lymphocytic leukemia) ([place]) Unfortunately, she have signs of lymphocytosis along with pancytopenia, suspicious for cancer progression I recommend her caregiver to inquire the facility staff to make sure that she is taking her medications as directed I recommend CT scan of the chest, abdomen and pelvis to reassess If CT imaging showed disease progression, I will consider switching her back on chemotherapy or venetoclax  Pancytopenia, acquired ([place]) This is suspicious for progression of disease Just to be sure, I will order serum vitamin B12 and iron studies to exclude nutritional deficiency as a cause of her pancytopenia  Morbid obesity due to excess calories ([place]) Her weight is stable I would inquire to see if she will be able to tolerate routine CT scanner due to morbid obesity   Orders Placed This Encounter  Procedures  . CT ABDOMEN PELVIS W CONTRAST    Standing Status:   Future    Standing Expiration Date:   12/23/2018    Order Specific Question:   If indicated for the ordered procedure, I authorize the administration of contrast media per Radiology protocol    Answer:   Yes    Order Specific Question:   Preferred imaging location?    Answer:   [facility]    Order Specific Question:   Radiology Contrast Protocol - do NOT remove file path    Answer:   \\[email or web address]  . CT CHEST W CONTRAST    Standing Status:   Future    Standing Expiration Date:   01/26/2019    Order Specific Question:   If indicated for the ordered procedure, I authorize the administration of contrast media per Radiology protocol    Answer:   Yes    Order Specific Question:   Preferred imaging location?    Answer:   [facility]    Order Specific Question:   Radiology Contrast Protocol - do NOT remove file  path    Answer:   \\[email or web address]  . FISH, CLL Prognostic Panel    Standing Status:   Future    Standing Expiration Date:   01/26/2019  . Hepatitis B core antibody, IgM    Standing Status:   Future    Standing Expiration Date:   01/26/2019  . Hepatitis B surface antibody    Standing Status:   Future    Standing Expiration Date:   01/26/2019  . Hepatitis B surface antigen    Standing Status:   Future    Standing Expiration Date:   01/26/2019  . CBC with Differential/Platelet    Standing Status:   Future    Standing Expiration Date:   01/26/2019  . Lactate dehydrogenase    Standing Status:   Future    Standing Expiration Date:   01/26/2019  . Uric acid    Standing Status:   Future    Standing Expiration Date:   01/26/2019  . Vitamin B12    Standing Status:   Future    Standing Expiration Date:   01/26/2019  . Iron and TIBC    Standing Status:   Future    Standing Expiration Date:   01/26/2019  . Ferritin    Standing Status:   Future    Standing Expiration Date:   01/26/2019    INTERVAL HISTORY: Please see below for problem oriented charting. She returns with her caregiver for  further follow-up There were no reported recent infection, fever or chills Her appetite is stable, no reported weight loss She continues to have intermittent bruising Her caregiver felt that the patient is taking her prescribed chemotherapy as instructed There were no reported recent abnormal night sweats  SUMMARY OF ONCOLOGIC HISTORY:   CLL (chronic lymphocytic leukemia) ([place])   07/02/2010 Procedure    LN biopsy confirmed CLL      08/18/2010 Procedure    Bone marrow biopsy confirmed CLL      11/18/2010 - 04/15/2011 Chemotherapy    patient received 6 cycles of FLudarabine and Rituximab      01/13/2012 Relapse/Recurrence    Repeat BM biopsy confirmed relapse      01/27/2012 - 06/23/2012 Chemotherapy    Bendamustine & Rituximab given, complicated by infusion reaction to  Rituximab, completed 6 cycles      06/29/2013 Imaging    Ct scan confirmed disease relapse with bulky lymphadenopathy      07/23/2013 Procedure    Repeat BM biopsy due to thrombocytopenia and progressive leukocytosis      08/02/2013 Relapse/Recurrence    Patient consented to start iburitinib as salvage therapy for relapsed CLL      11/09/2013 Imaging    Repeat CT scan the chest, abdomen and pelvis showed greater than 50% response to treatment      06/17/2014 Imaging    Repeat CT scan showed near complete response to treatment.      05/30/2015 Imaging    Repeat CT scan showed near complete response to treatment.      06/14/2016 Imaging    CT scan showed stable scattered small retroperitoneal lymph nodes and pelvic lymph nodes. No findings for recurrent or progressive lymphoma. Stable mild splenomegaly.       REVIEW OF SYSTEMS:   Constitutional: Denies fevers, chills or abnormal weight loss Eyes: Denies blurriness of vision Ears, nose, mouth, throat, and face: Denies mucositis or sore throat Respiratory: Denies cough, dyspnea or wheezes Cardiovascular: Denies palpitation, chest discomfort or lower extremity swelling Gastrointestinal:  Denies nausea, heartburn or change in bowel habits Skin: Denies abnormal skin rashes Lymphatics: Denies new lymphadenopathy  Neurological:Denies numbness, tingling or new weaknesses Behavioral/Psych: Mood is stable, no new changes  All other systems were reviewed with the patient and are negative.  I have reviewed the past medical history, past surgical history, social history and family history with the patient and they are unchanged from previous note.  ALLERGIES:  is allergic to azithromycin and ibuprofen [ibuprofen].  MEDICATIONS:  Current Outpatient Medications  Medication Sig Dispense Refill  . amLODipine (NORVASC) 10 MG tablet TAKE 1 TABLET (10 MG TOTAL) BY MOUTH DAILY. 90 tablet 1  . bisoprolol (ZEBETA) 5 MG tablet   10  .  Carboxymethylcellul-Glycerin (REFRESH OPTIVE SENSITIVE OP) Place 1 drop into both eyes as needed.     . citalopram (CELEXA) 20 MG tablet Take 20 mg by mouth every morning.     . ibrutinib (IMBRUVICA) 140 MG capsul Take 3 capsules (420 mg total) by mouth daily. 90 capsule 11  . loratadine (CLARITIN) 10 MG tablet Take 10 mg daily by mouth.    . losartan-hydrochlorothiazide (HYZAAR) 100-25 MG tablet   9  . Multiple Vitamins-Minerals (PRESERVISION AREDS 2) CAPS Take 1 capsule by mouth 2 (two) times daily.    . Vitamin D, Ergocalciferol, 2000 units CAPS Take by mouth.     No current facility-administered medications for this visit.     PHYSICAL EXAMINATION: ECOG PERFORMANCE STATUS:  1 - Symptomatic but completely ambulatory  Vitals:   12/22/17 1215  BP: (!) 148/74  Pulse: 73  Resp: 18  Temp: 98.3 F (36.8 C)  SpO2: 95%   Filed Weights   12/22/17 1215  Weight: 299 lb 14.4 oz (136 kg)    GENERAL:alert, no distress and comfortable.  She is morbidly obese SKIN: skin color, texture, turgor are normal, no rashes or significant lesions EYES: normal, Conjunctiva are pink and non-injected, sclera clear OROPHARYNX:no exudate, no erythema and lips, buccal mucosa, and tongue normal  NECK: supple, thyroid normal size, non-tender, without nodularity LYMPH:  no palpable lymphadenopathy in the cervical, axillary or inguinal LUNGS: clear to auscultation and percussion with normal breathing effort HEART: regular rate & rhythm and no murmurs and no lower extremity edema ABDOMEN:abdomen soft, non-tender and normal bowel sounds Musculoskeletal:no cyanosis of digits and no clubbing  NEURO: alert & oriented x 3 with fluent speech, no focal motor/sensory deficits  LABORATORY DATA:  I have reviewed the data as listed    Component Value Date/Time   NA 142 12/22/2017 1200   NA 141 08/23/2017 1026   K 3.6 12/22/2017 1200   K 3.7 08/23/2017 1026   CL 100 12/22/2017 1200   CL 98 02/02/2013 1353   CO2  34 (H) 12/22/2017 1200   CO2 33 (H) 08/23/2017 1026   GLUCOSE 99 12/22/2017 1200   GLUCOSE 92 08/23/2017 1026   GLUCOSE 99 02/02/2013 1353   BUN 24 12/22/2017 1200   BUN 21.0 08/23/2017 1026   CREATININE 1.30 (H) 12/22/2017 1200   CREATININE 1.2 (H) 08/23/2017 1026   CALCIUM 9.8 12/22/2017 1200   CALCIUM 9.3 08/23/2017 1026   PROT 6.1 (L) 12/22/2017 1200   PROT 6.3 (L) 08/23/2017 1026   ALBUMIN 3.7 12/22/2017 1200   ALBUMIN 3.8 08/23/2017 1026   AST 10 12/22/2017 1200   AST 11 08/23/2017 1026   ALT 15 12/22/2017 1200   ALT 10 08/23/2017 1026   ALKPHOS 74 12/22/2017 1200   ALKPHOS 66 08/23/2017 1026   BILITOT 0.5 12/22/2017 1200   BILITOT 0.59 08/23/2017 1026   GFRNONAA 40 (L) 12/22/2017 1200   GFRAA 47 (L) 12/22/2017 1200    No results found for: SPEP, UPEP  Lab Results  Component Value Date   WBC 19.4 (H) 12/22/2017   NEUTROABS 7.5 (H) 12/22/2017   HGB 11.5 (L) 12/22/2017   HCT 36.4 12/22/2017   MCV 83.9 12/22/2017   PLT 125 (L) 12/22/2017      Chemistry      Component Value Date/Time   NA 142 12/22/2017 1200   NA 141 08/23/2017 1026   K 3.6 12/22/2017 1200   K 3.7 08/23/2017 1026   CL 100 12/22/2017 1200   CL 98 02/02/2013 1353   CO2 34 (H) 12/22/2017 1200   CO2 33 (H) 08/23/2017 1026   BUN 24 12/22/2017 1200   BUN 21.0 08/23/2017 1026   CREATININE 1.30 (H) 12/22/2017 1200   CREATININE 1.2 (H) 08/23/2017 1026      Component Value Date/Time   CALCIUM 9.8 12/22/2017 1200   CALCIUM 9.3 08/23/2017 1026   ALKPHOS 74 12/22/2017 1200   ALKPHOS 66 08/23/2017 1026   AST 10 12/22/2017 1200   AST 11 08/23/2017 1026   ALT 15 12/22/2017 1200   ALT 10 08/23/2017 1026   BILITOT 0.5 12/22/2017 1200   BILITOT 0.59 08/23/2017 1026       All questions were answered. The patient knows to call the clinic  with any problems, questions or concerns. No barriers to learning was detected.  I spent 25 minutes counseling the patient face to face. The total time spent in  the appointment was 30 minutes and more than 50% was on counseling and review of test results  Heath Lark, MD 12/22/2017 4:22 PM

## 2017-12-22 NOTE — Assessment & Plan Note (Signed)
This is suspicious for progression of disease Just to be sure, I will order serum vitamin B12 and iron studies to exclude nutritional deficiency as a cause of her pancytopenia

## 2017-12-22 NOTE — Telephone Encounter (Signed)
Gave patient AVs and calendar of upcoming march appointments.

## 2017-12-22 NOTE — Assessment & Plan Note (Signed)
Unfortunately, she have signs of lymphocytosis along with pancytopenia, suspicious for cancer progression I recommend her caregiver to inquire the facility staff to make sure that she is taking her medications as directed I recommend CT scan of the chest, abdomen and pelvis to reassess If CT imaging showed disease progression, I will consider switching her back on chemotherapy or venetoclax

## 2017-12-22 NOTE — Assessment & Plan Note (Signed)
Her weight is stable I would inquire to see if she will be able to tolerate routine CT scanner due to morbid obesity

## 2017-12-29 ENCOUNTER — Ambulatory Visit (HOSPITAL_COMMUNITY)
Admission: RE | Admit: 2017-12-29 | Discharge: 2017-12-29 | Disposition: A | Discharge status: Home / Self Care | Payer: Medicare Other | Source: Ambulatory Visit | Attending: Hematology and Oncology | Admitting: Hematology and Oncology

## 2017-12-29 DIAGNOSIS — C9190 Lymphoid leukemia, unspecified not having achieved remission: Secondary | ICD-10-CM | POA: Diagnosis not present

## 2017-12-29 DIAGNOSIS — R161 Splenomegaly, not elsewhere classified: Secondary | ICD-10-CM | POA: Diagnosis not present

## 2017-12-29 DIAGNOSIS — C9110 Chronic lymphocytic leukemia of B-cell type not having achieved remission: Secondary | ICD-10-CM

## 2017-12-29 DIAGNOSIS — I700 Atherosclerosis of aorta: Secondary | ICD-10-CM | POA: Insufficient documentation

## 2017-12-29 DIAGNOSIS — I2510 Atherosclerotic heart disease of native coronary artery without angina pectoris: Secondary | ICD-10-CM | POA: Diagnosis not present

## 2017-12-29 DIAGNOSIS — R590 Localized enlarged lymph nodes: Secondary | ICD-10-CM | POA: Insufficient documentation

## 2017-12-29 MED ORDER — IOPAMIDOL (ISOVUE-300) INJECTION 61%
INTRAVENOUS | Status: AC
  Filled 2017-12-29: qty 100

## 2017-12-29 MED ORDER — IOPAMIDOL (ISOVUE-300) INJECTION 61%
100.0000 mL | Freq: Once | INTRAVENOUS | Status: AC | PRN
  Administered 2017-12-29: 80 mL via INTRAVENOUS

## 2018-01-02 ENCOUNTER — Telehealth: Payer: Self-pay

## 2018-01-02 ENCOUNTER — Inpatient Hospital Stay: Payer: Medicare Other

## 2018-01-02 ENCOUNTER — Other Ambulatory Visit: Payer: Self-pay | Admitting: *Deleted

## 2018-01-02 ENCOUNTER — Encounter: Payer: Self-pay | Admitting: Hematology and Oncology

## 2018-01-02 ENCOUNTER — Inpatient Hospital Stay (HOSPITAL_BASED_OUTPATIENT_CLINIC_OR_DEPARTMENT_OTHER): Payer: Medicare Other | Admitting: Hematology and Oncology

## 2018-01-02 VITALS — BP 154/73 | HR 65 | Temp 97.7°F | Resp 18 | Ht 63.0 in | Wt 298.4 lb

## 2018-01-02 DIAGNOSIS — D61818 Other pancytopenia: Secondary | ICD-10-CM | POA: Diagnosis not present

## 2018-01-02 DIAGNOSIS — C9110 Chronic lymphocytic leukemia of B-cell type not having achieved remission: Secondary | ICD-10-CM | POA: Diagnosis not present

## 2018-01-02 DIAGNOSIS — Z7189 Other specified counseling: Secondary | ICD-10-CM | POA: Diagnosis not present

## 2018-01-02 DIAGNOSIS — I10 Essential (primary) hypertension: Secondary | ICD-10-CM

## 2018-01-02 DIAGNOSIS — E790 Hyperuricemia without signs of inflammatory arthritis and tophaceous disease: Secondary | ICD-10-CM | POA: Diagnosis not present

## 2018-01-02 DIAGNOSIS — C9190 Lymphoid leukemia, unspecified not having achieved remission: Secondary | ICD-10-CM | POA: Diagnosis not present

## 2018-01-02 LAB — CBC WITH DIFFERENTIAL/PLATELET
Basophils Absolute: 0.1 10*3/uL (ref 0.0–0.1)
Basophils Relative: 0 %
Eosinophils Absolute: 0.2 10*3/uL (ref 0.0–0.5)
Eosinophils Relative: 1 %
HCT: 36.9 % (ref 34.8–46.6)
Hemoglobin: 11.5 g/dL — ABNORMAL LOW (ref 11.6–15.9)
Lymphocytes Relative: 61 %
Lymphs Abs: 12.7 10*3/uL — ABNORMAL HIGH (ref 0.9–3.3)
MCH: 26.7 pg (ref 25.1–34.0)
MCHC: 31.2 g/dL — ABNORMAL LOW (ref 31.5–36.0)
MCV: 85.6 fL (ref 79.5–101.0)
Monocytes Absolute: 0.8 10*3/uL (ref 0.1–0.9)
Monocytes Relative: 4 %
Neutro Abs: 7.2 10*3/uL — ABNORMAL HIGH (ref 1.5–6.5)
Neutrophils Relative %: 34 %
Platelets: 140 10*3/uL — ABNORMAL LOW (ref 145–400)
RBC: 4.31 MIL/uL (ref 3.70–5.45)
RDW: 15.7 % — ABNORMAL HIGH (ref 11.2–14.5)
WBC: 20.9 10*3/uL — ABNORMAL HIGH (ref 3.9–10.3)

## 2018-01-02 LAB — CMP (CANCER CENTER ONLY)
ALT: 14 U/L (ref 0–55)
AST: 11 U/L (ref 5–34)
Albumin: 3.9 g/dL (ref 3.5–5.0)
Alkaline Phosphatase: 67 U/L (ref 40–150)
Anion gap: 10 (ref 3–11)
BUN: 21 mg/dL (ref 7–26)
CO2: 30 mmol/L — ABNORMAL HIGH (ref 22–29)
Calcium: 9.1 mg/dL (ref 8.4–10.4)
Chloride: 104 mmol/L (ref 98–109)
Creatinine: 1.22 mg/dL — ABNORMAL HIGH (ref 0.60–1.10)
GFR, Est AFR Am: 50 mL/min — ABNORMAL LOW (ref >60)
GFR, Estimated: 43 mL/min — ABNORMAL LOW (ref >60)
Glucose, Bld: 99 mg/dL (ref 70–140)
Potassium: 3.6 mmol/L (ref 3.5–5.1)
Sodium: 144 mmol/L (ref 136–145)
Total Bilirubin: 0.6 mg/dL (ref 0.2–1.2)
Total Protein: 5.6 g/dL — ABNORMAL LOW (ref 6.4–8.3)

## 2018-01-02 LAB — IRON AND TIBC
Iron: 48 ug/dL (ref 41–142)
Saturation Ratios: 17 % — ABNORMAL LOW (ref 21–57)
TIBC: 280 ug/dL (ref 236–444)
UIBC: 232 ug/dL

## 2018-01-02 LAB — FERRITIN: Ferritin: 66 ng/mL (ref 9–269)

## 2018-01-02 LAB — LACTATE DEHYDROGENASE: LDH: 188 U/L (ref 125–245)

## 2018-01-02 LAB — VITAMIN B12: Vitamin B-12: 225 pg/mL (ref 180–914)

## 2018-01-02 LAB — URIC ACID: Uric Acid, Serum: 10.9 mg/dL — ABNORMAL HIGH (ref 2.6–7.4)

## 2018-01-02 NOTE — Assessment & Plan Note (Signed)
She has hyperuricemia and is at risk of tumor lysis syndrome I will start her on allopurinol and encourage increase oral fluid intake So far, her creatinine is stable

## 2018-01-02 NOTE — Assessment & Plan Note (Signed)
The patient is aware she has incurable disease and treatment is strictly palliative. We discussed importance of Advanced Directives and Living will. Multiple family members her dedicated medical healthcare power of attorney

## 2018-01-02 NOTE — Assessment & Plan Note (Signed)
I have reviewed CT imaging with the patient and caregiver Unfortunately, it confirmed disease progression I am awaiting repeat FISH analysis to see if she has any adverse mutations In the meantime, she will continue on ibrutinib We discussed treatment options based on current guidelines We discussed treatment including Gazyva plus bendamustine, venetoclax or rituximab with idelalisib Some of the, risk, benefits, side effects of treatment were discussed and the patient and family members are undecided at this point I will call them once I have test results back to determine the next plan of care

## 2018-01-02 NOTE — Assessment & Plan Note (Signed)
This is suspicious for progression of disease Serum vitamin B12 is pending She is not symptomatic Observe only

## 2018-01-02 NOTE — Assessment & Plan Note (Signed)
Her blood pressure is elevated We discussed dietary modification We will monitor closely

## 2018-01-02 NOTE — Telephone Encounter (Signed)
Attempted to call, mailbox full. Unable to leave a message.

## 2018-01-02 NOTE — Progress Notes (Signed)
[place] OFFICE PROGRESS NOTE  Patient Care Team: Jonathon Jordan, MD as PCP - General (Family Medicine)  ASSESSMENT & PLAN:  CLL (chronic lymphocytic leukemia) ([place]) I have reviewed CT imaging with the patient and caregiver Unfortunately, it confirmed disease progression I am awaiting repeat FISH analysis to see if she has any adverse mutations In the meantime, she will continue on ibrutinib We discussed treatment options based on current guidelines We discussed treatment including Gazyva plus bendamustine, venetoclax or rituximab with idelalisib Some of the, risk, benefits, side effects of treatment were discussed and the patient and family members are undecided at this point I will call them once I have test results back to determine the next plan of care  Pancytopenia, acquired [facility]) This is suspicious for progression of disease Serum vitamin B12 is pending She is not symptomatic Observe only  Hyperuricemia She has hyperuricemia and is at risk of tumor lysis syndrome I will start her on allopurinol and encourage increase oral fluid intake So far, her creatinine is stable  Essential hypertension Her blood pressure is elevated We discussed dietary modification We will monitor closely  Goals of care, counseling/discussion The patient is aware she has incurable disease and treatment is strictly palliative. We discussed importance of Advanced Directives and Living will. Multiple family members her dedicated medical healthcare power of attorney   Orders Placed This Encounter  Procedures  . Flow Cytometry    CLL    Standing Status:   Future    Number of Occurrences:   1    Standing Expiration Date:   02/06/2019  . CMP ([place] only)    Standing Status:   Future    Number of Occurrences:   1    Standing Expiration Date:   01/03/2019    INTERVAL HISTORY: Please see below for problem oriented charting. She returns with her knees for further  follow-up She feels well No recent infection. She has lost some weight due to dietary modification  SUMMARY OF ONCOLOGIC HISTORY:   CLL (chronic lymphocytic leukemia) ([place])   07/02/2010 Procedure    LN biopsy confirmed CLL      08/18/2010 Procedure    Bone marrow biopsy confirmed CLL      11/18/2010 - 04/15/2011 Chemotherapy    patient received 6 cycles of FLudarabine and Rituximab      01/13/2012 Relapse/Recurrence    Repeat BM biopsy confirmed relapse      01/27/2012 - 06/23/2012 Chemotherapy    Bendamustine & Rituximab given, complicated by infusion reaction to Rituximab, completed 6 cycles      06/29/2013 Imaging    Ct scan confirmed disease relapse with bulky lymphadenopathy      07/23/2013 Procedure    Repeat BM biopsy due to thrombocytopenia and progressive leukocytosis      08/02/2013 Relapse/Recurrence    Patient consented to start iburitinib as salvage therapy for relapsed CLL      11/09/2013 Imaging    Repeat CT scan the chest, abdomen and pelvis showed greater than 50% response to treatment      06/17/2014 Imaging    Repeat CT scan showed near complete response to treatment.      05/30/2015 Imaging    Repeat CT scan showed near complete response to treatment.      06/14/2016 Imaging    CT scan showed stable scattered small retroperitoneal lymph nodes and pelvic lymph nodes. No findings for recurrent or progressive lymphoma. Stable mild splenomegaly.      12/30/2017  Imaging    1. Recurrent adenopathy within the chest, abdomen, and pelvis. 2. Splenomegaly. 3. Aortic Atherosclerosis (ICD10-I70.0). 4. Coronary artery calcifications.       REVIEW OF SYSTEMS:   Constitutional: Denies fevers, chills or abnormal weight loss Eyes: Denies blurriness of vision Ears, nose, mouth, throat, and face: Denies mucositis or sore throat Respiratory: Denies cough, dyspnea or wheezes Cardiovascular: Denies palpitation, chest discomfort or lower extremity  swelling Gastrointestinal:  Denies nausea, heartburn or change in bowel habits Skin: Denies abnormal skin rashes Neurological:Denies numbness, tingling or new weaknesses Behavioral/Psych: Mood is stable, no new changes  All other systems were reviewed with the patient and are negative.  I have reviewed the past medical history, past surgical history, social history and family history with the patient and they are unchanged from previous note.  ALLERGIES:  is allergic to azithromycin and ibuprofen [ibuprofen].  MEDICATIONS:  Current Outpatient Medications  Medication Sig Dispense Refill  . amLODipine (NORVASC) 10 MG tablet TAKE 1 TABLET (10 MG TOTAL) BY MOUTH DAILY. 90 tablet 1  . bisoprolol (ZEBETA) 5 MG tablet   10  . Carboxymethylcellul-Glycerin (REFRESH OPTIVE SENSITIVE OP) Place 1 drop into both eyes as needed.     . citalopram (CELEXA) 20 MG tablet Take 20 mg by mouth every morning.     . ibrutinib (IMBRUVICA) 140 MG capsul Take 3 capsules (420 mg total) by mouth daily. 90 capsule 11  . loratadine (CLARITIN) 10 MG tablet Take 10 mg daily by mouth.    . losartan-hydrochlorothiazide (HYZAAR) 100-25 MG tablet   9  . Multiple Vitamins-Minerals (PRESERVISION AREDS 2) CAPS Take 1 capsule by mouth 2 (two) times daily.    . Vitamin D, Ergocalciferol, 2000 units CAPS Take by mouth.     No current facility-administered medications for this visit.     PHYSICAL EXAMINATION: ECOG PERFORMANCE STATUS: 1 - Symptomatic but completely ambulatory  Vitals:   01/02/18 1041  BP: (!) 154/73  Pulse: 65  Resp: 18  Temp: 97.7 F (36.5 C)  SpO2: 96%   Filed Weights   01/02/18 1041  Weight: 298 lb 6.4 oz (135.4 kg)    GENERAL:alert, no distress and comfortable  NEURO: alert & oriented x 3 with fluent speech, no focal motor/sensory deficits  LABORATORY DATA:  I have reviewed the data as listed    Component Value Date/Time   NA 144 01/02/2018 1109   NA 141 08/23/2017 1026   K 3.6  01/02/2018 1109   K 3.7 08/23/2017 1026   CL 104 01/02/2018 1109   CL 98 02/02/2013 1353   CO2 30 (H) 01/02/2018 1109   CO2 33 (H) 08/23/2017 1026   GLUCOSE 99 01/02/2018 1109   GLUCOSE 92 08/23/2017 1026   GLUCOSE 99 02/02/2013 1353   BUN 21 01/02/2018 1109   BUN 21.0 08/23/2017 1026   CREATININE 1.22 (H) 01/02/2018 1109   CREATININE 1.2 (H) 08/23/2017 1026   CALCIUM 9.1 01/02/2018 1109   CALCIUM 9.3 08/23/2017 1026   PROT 5.6 (L) 01/02/2018 1109   PROT 6.3 (L) 08/23/2017 1026   ALBUMIN 3.9 01/02/2018 1109   ALBUMIN 3.8 08/23/2017 1026   AST 11 01/02/2018 1109   AST 11 08/23/2017 1026   ALT 14 01/02/2018 1109   ALT 10 08/23/2017 1026   ALKPHOS 67 01/02/2018 1109   ALKPHOS 66 08/23/2017 1026   BILITOT 0.6 01/02/2018 1109   BILITOT 0.59 08/23/2017 1026   GFRNONAA 43 (L) 01/02/2018 1109   GFRAA 50 (L) 01/02/2018 1109  No results found for: SPEP, UPEP  Lab Results  Component Value Date   WBC 20.9 (H) 01/02/2018   NEUTROABS 7.2 (H) 01/02/2018   HGB 11.5 (L) 01/02/2018   HCT 36.9 01/02/2018   MCV 85.6 01/02/2018   PLT 140 (L) 01/02/2018      Chemistry      Component Value Date/Time   NA 144 01/02/2018 1109   NA 141 08/23/2017 1026   K 3.6 01/02/2018 1109   K 3.7 08/23/2017 1026   CL 104 01/02/2018 1109   CL 98 02/02/2013 1353   CO2 30 (H) 01/02/2018 1109   CO2 33 (H) 08/23/2017 1026   BUN 21 01/02/2018 1109   BUN 21.0 08/23/2017 1026   CREATININE 1.22 (H) 01/02/2018 1109   CREATININE 1.2 (H) 08/23/2017 1026      Component Value Date/Time   CALCIUM 9.1 01/02/2018 1109   CALCIUM 9.3 08/23/2017 1026   ALKPHOS 67 01/02/2018 1109   ALKPHOS 66 08/23/2017 1026   AST 11 01/02/2018 1109   AST 11 08/23/2017 1026   ALT 14 01/02/2018 1109   ALT 10 08/23/2017 1026   BILITOT 0.6 01/02/2018 1109   BILITOT 0.59 08/23/2017 1026       RADIOGRAPHIC STUDIES: I have reviewed multiple imaging study with her niece I have personally reviewed the radiological images as  listed and agreed with the findings in the report. Ct Chest W Contrast  Result Date: 12/30/2017 CLINICAL DATA:  Chronic lymphocytic leukemia. EXAM: CT CHEST, ABDOMEN, AND PELVIS WITH CONTRAST TECHNIQUE: Multidetector CT imaging of the chest, abdomen and pelvis was performed following the standard protocol during bolus administration of intravenous contrast. CONTRAST:  6m ISOVUE-300 IOPAMIDOL (ISOVUE-300) INJECTION 61% COMPARISON:  06/14/2016. FINDINGS: CT CHEST FINDINGS Cardiovascular: The heart size is normal. No pericardial effusion. Aortic atherosclerosis. Calcification in the RCA, LAD coronary artery noted. Mediastinum/Nodes: Normal appearance of the thyroid gland. The trachea appears patent and is midline. Normal appearance of the esophagus. Recurrent thoracic adenopathy identified. Index right axillary node measures 1.3 cm, image 8 of series 2. Left axillary node measures 1.4 cm, image 10/series 2. Subcarinal lymph node measures 1.4 cm, image 23/series 2. Right hilar node measures 1.4 cm, image 27/2. Lungs/Pleura: No pleural effusion. Bilateral peripheral and lower lobe predominant interstitial reticulation and pleural thickening is identified. New from previous exam. Pulmonary nodule in the right middle lobe measures 4 mm. Unchanged from previous exam, image 62/6. 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 No aggressive lytic or sclerotic bone lesions identified. CT ABDOMEN PELVIS FINDINGS Hepatobiliary: No suspicious liver abnormality. Previous cholecystectomy. Pancreas: Unremarkable. No pancreatic ductal dilatation or surrounding inflammatory changes. Spleen: The spleen measures 11.6 by 6.9 by 17.5 cm (volume = 730 cm^3). No focal splenic abnormality identified. Adrenals/Urinary Tract: Normal appearance of the adrenal glands. No kidney mass or hydronephrosis. Urinary bladder appears normal. Stomach/Bowel: Stomach is within normal limits. Appendix appears normal. No  evidence of bowel wall thickening, distention, or inflammatory changes. Vascular/Lymphatic: Aortic atherosclerosis. No aneurysm. Recurrent adenopathy identified within the abdomen and pelvis. Peripancreatic node measures 2.3 cm, image 52/2. Right retroperitoneal/retrocaval node measures 1.6 cm, image 66/2. Left retroperitoneal node measures 2.3 cm, image 73/series 2. Bilateral pelvic adenopathy. Left pelvic sidewall node measures 3 cm, image 104/series 2. Right pelvic sidewall node measures 2.6 cm, image 105/2. Reproductive: Status post hysterectomy. No adnexal masses. Other: No free fluid or fluid collections. Periumbilical hernia is noted which contains fat only. For Musculoskeletal: Degenerative disc disease identified within the  lumbar spine. IMPRESSION: 1. Recurrent adenopathy within the chest, abdomen, and pelvis. 2. Splenomegaly. 3.  Aortic Atherosclerosis (ICD10-I70.0). 4. Coronary artery calcifications. Electronically Signed   By: Kerby Moors M.D.   On: 12/30/2017 09:13   Ct Abdomen Pelvis W Contrast  Result Date: 12/30/2017 CLINICAL DATA:  Chronic lymphocytic leukemia. EXAM: CT CHEST, ABDOMEN, AND PELVIS WITH CONTRAST TECHNIQUE: Multidetector CT imaging of the chest, abdomen and pelvis was performed following the standard protocol during bolus administration of intravenous contrast. CONTRAST:  50m ISOVUE-300 IOPAMIDOL (ISOVUE-300) INJECTION 61% COMPARISON:  06/14/2016. FINDINGS: CT CHEST FINDINGS Cardiovascular: The heart size is normal. No pericardial effusion. Aortic atherosclerosis. Calcification in the RCA, LAD coronary artery noted. Mediastinum/Nodes: Normal appearance of the thyroid gland. The trachea appears patent and is midline. Normal appearance of the esophagus. Recurrent thoracic adenopathy identified. Index right axillary node measures 1.3 cm, image 8 of series 2. Left axillary node measures 1.4 cm, image 10/series 2. Subcarinal lymph node measures 1.4 cm, image 23/series 2. Right  hilar node measures 1.4 cm, image 27/2. Lungs/Pleura: No pleural effusion. Bilateral peripheral and lower lobe predominant interstitial reticulation and pleural thickening is identified. New from previous exam. Pulmonary nodule in the right middle lobe measures 4 mm. Unchanged from previous exam, image 62/6. 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 No aggressive lytic or sclerotic bone lesions identified. CT ABDOMEN PELVIS FINDINGS Hepatobiliary: No suspicious liver abnormality. Previous cholecystectomy. Pancreas: Unremarkable. No pancreatic ductal dilatation or surrounding inflammatory changes. Spleen: The spleen measures 11.6 by 6.9 by 17.5 cm (volume = 730 cm^3). No focal splenic abnormality identified. Adrenals/Urinary Tract: Normal appearance of the adrenal glands. No kidney mass or hydronephrosis. Urinary bladder appears normal. Stomach/Bowel: Stomach is within normal limits. Appendix appears normal. No evidence of bowel wall thickening, distention, or inflammatory changes. Vascular/Lymphatic: Aortic atherosclerosis. No aneurysm. Recurrent adenopathy identified within the abdomen and pelvis. Peripancreatic node measures 2.3 cm, image 52/2. Right retroperitoneal/retrocaval node measures 1.6 cm, image 66/2. Left retroperitoneal node measures 2.3 cm, image 73/series 2. Bilateral pelvic adenopathy. Left pelvic sidewall node measures 3 cm, image 104/series 2. Right pelvic sidewall node measures 2.6 cm, image 105/2. Reproductive: Status post hysterectomy. No adnexal masses. Other: No free fluid or fluid collections. Periumbilical hernia is noted which contains fat only. For Musculoskeletal: Degenerative disc disease identified within the lumbar spine. IMPRESSION: 1. Recurrent adenopathy within the chest, abdomen, and pelvis. 2. Splenomegaly. 3.  Aortic Atherosclerosis (ICD10-I70.0). 4. Coronary artery calcifications. Electronically Signed   By: TKerby MoorsM.D.   On:  12/30/2017 09:13    All questions were answered. The patient knows to call the clinic with any problems, questions or concerns. No barriers to learning was detected.  I spent 30 minutes counseling the patient face to face. The total time spent in the appointment was 40 minutes and more than 50% was on counseling and review of test results  NHeath Lark MD 01/02/2018 1:24 PM

## 2018-01-03 ENCOUNTER — Other Ambulatory Visit: Payer: Self-pay

## 2018-01-03 LAB — HEPATITIS B SURFACE ANTIBODY,QUALITATIVE: Hep B S Ab: NONREACTIVE

## 2018-01-03 LAB — HEPATITIS B SURFACE ANTIGEN: Hepatitis B Surface Ag: NEGATIVE

## 2018-01-03 LAB — FLOW CYTOMETRY

## 2018-01-03 LAB — HEPATITIS B CORE ANTIBODY, IGM: Hep B C IgM: NEGATIVE

## 2018-01-03 MED ORDER — ALLOPURINOL 300 MG PO TABS: 300.0000 mg | ORAL_TABLET | Freq: Every day | ORAL | 2 refills | Status: DC

## 2018-01-03 NOTE — Telephone Encounter (Signed)
Erin Moore, niece of patient. Dr. Alvy Bimler is ordering  Allopurinol 300 mg Daily. She said to call [facility] and give to the order to the nurse at [phone].

## 2018-01-03 NOTE — Telephone Encounter (Signed)
[facility] nursing office and faxed Rx for Allopurinol to [phone].

## 2018-01-03 NOTE — Telephone Encounter (Signed)
Called nursing office

## 2018-01-09 ENCOUNTER — Telehealth: Payer: Self-pay | Admitting: *Deleted

## 2018-01-09 ENCOUNTER — Other Ambulatory Visit: Payer: Self-pay | Admitting: Hematology and Oncology

## 2018-01-09 ENCOUNTER — Telehealth: Payer: Self-pay | Admitting: Hematology and Oncology

## 2018-01-09 DIAGNOSIS — C9110 Chronic lymphocytic leukemia of B-cell type not having achieved remission: Secondary | ICD-10-CM

## 2018-01-09 MED ORDER — ACYCLOVIR 400 MG PO TABS
400.0000 mg | ORAL_TABLET | Freq: Every day | ORAL | 5 refills | Status: DC
Start: 2018-01-09 — End: 2020-02-05

## 2018-01-09 MED ORDER — LIDOCAINE-PRILOCAINE 2.5-2.5 % EX CREA: TOPICAL_CREAM | CUTANEOUS | 3 refills | Status: DC

## 2018-01-09 MED ORDER — PROCHLORPERAZINE MALEATE 10 MG PO TABS: 10.0000 mg | ORAL_TABLET | Freq: Four times a day (QID) | ORAL | 1 refills | Status: DC | PRN

## 2018-01-09 MED ORDER — PREDNISONE 50 MG PO TABS: 50.0000 mg | ORAL_TABLET | Freq: Every day | ORAL | 0 refills | Status: DC

## 2018-01-09 MED ORDER — ONDANSETRON HCL 8 MG PO TABS: 8.0000 mg | ORAL_TABLET | Freq: Three times a day (TID) | ORAL | 1 refills | Status: DC | PRN

## 2018-01-09 NOTE — Progress Notes (Signed)
START OFF PATHWAY REGIMEN - Lymphoma and CLL   [id number]:Bendamustine 90 mg/m2 D1, 2 + Rituximab (IV/Subcut) D1 q28 Days:   A cycle is every 28 days:     Bendamustine      Rituximab      Rituximab and hyaluronidase human   **Always confirm dose/schedule in your pharmacy ordering system**    Patient Characteristics: Small Lymphocytic Lymphoma (SLL), Third Line, 17p del (-) or Unknown Disease Type: Small Lymphocytic Lymphoma (SLL) Disease Type: Not Applicable Disease Type: Not Applicable [place] Stage: IV Line of therapy: Third Line 17p Deletion Status: Negative Intent of Therapy: Non-Curative / Palliative Intent, Discussed with Patient

## 2018-01-09 NOTE — Telephone Encounter (Signed)
I spoke with the patient's caregiver, Olivia Mackie I recommend chemo class and see her back on 01/20/2018 with plan to start chemo the following week Due to high uric acid level, I plan to give her rasburicase next week I will schedule port placement The plan will be to give her Gazyva plus bendamustine

## 2018-01-09 NOTE — Telephone Encounter (Signed)
Spoke with care coordinator for [place] at [facility]. Prescriptions for Prednisone, EMLA cream, Zofran, Compazine and Acyclovir faxed to [phone]

## 2018-01-11 ENCOUNTER — Telehealth: Payer: Self-pay | Admitting: Hematology and Oncology

## 2018-01-11 NOTE — Telephone Encounter (Signed)
Spoke to patients niece regarding upcoming April appointments per 3/25 sch message

## 2018-01-15 ENCOUNTER — Other Ambulatory Visit: Payer: Self-pay | Admitting: Radiology

## 2018-01-16 DIAGNOSIS — R0989 Other specified symptoms and signs involving the circulatory and respiratory systems: Secondary | ICD-10-CM

## 2018-01-16 HISTORY — DX: Other specified symptoms and signs involving the circulatory and respiratory systems: R09.89

## 2018-01-17 ENCOUNTER — Other Ambulatory Visit: Payer: Self-pay | Admitting: Hematology and Oncology

## 2018-01-17 ENCOUNTER — Ambulatory Visit (HOSPITAL_COMMUNITY)
Admission: RE | Admit: 2018-01-17 | Discharge: 2018-01-17 | Disposition: A | Discharge status: Home / Self Care | Payer: Medicare Other | Source: Ambulatory Visit | Attending: Hematology and Oncology | Admitting: Hematology and Oncology

## 2018-01-17 ENCOUNTER — Encounter (HOSPITAL_COMMUNITY): Payer: Self-pay

## 2018-01-17 ENCOUNTER — Telehealth: Payer: Self-pay

## 2018-01-17 ENCOUNTER — Other Ambulatory Visit: Payer: Self-pay | Admitting: Pharmacist

## 2018-01-17 DIAGNOSIS — Z90710 Acquired absence of both cervix and uterus: Secondary | ICD-10-CM | POA: Diagnosis not present

## 2018-01-17 DIAGNOSIS — I10 Essential (primary) hypertension: Secondary | ICD-10-CM | POA: Insufficient documentation

## 2018-01-17 DIAGNOSIS — Z881 Allergy status to other antibiotic agents status: Secondary | ICD-10-CM | POA: Diagnosis not present

## 2018-01-17 DIAGNOSIS — Z9889 Other specified postprocedural states: Secondary | ICD-10-CM | POA: Insufficient documentation

## 2018-01-17 DIAGNOSIS — M1990 Unspecified osteoarthritis, unspecified site: Secondary | ICD-10-CM | POA: Diagnosis not present

## 2018-01-17 DIAGNOSIS — Z5111 Encounter for antineoplastic chemotherapy: Secondary | ICD-10-CM | POA: Diagnosis not present

## 2018-01-17 DIAGNOSIS — Z803 Family history of malignant neoplasm of breast: Secondary | ICD-10-CM | POA: Insufficient documentation

## 2018-01-17 DIAGNOSIS — Z79899 Other long term (current) drug therapy: Secondary | ICD-10-CM | POA: Diagnosis not present

## 2018-01-17 DIAGNOSIS — C9110 Chronic lymphocytic leukemia of B-cell type not having achieved remission: Secondary | ICD-10-CM | POA: Diagnosis not present

## 2018-01-17 DIAGNOSIS — Z886 Allergy status to analgesic agent status: Secondary | ICD-10-CM | POA: Diagnosis not present

## 2018-01-17 DIAGNOSIS — E785 Hyperlipidemia, unspecified: Secondary | ICD-10-CM | POA: Insufficient documentation

## 2018-01-17 DIAGNOSIS — C9190 Lymphoid leukemia, unspecified not having achieved remission: Secondary | ICD-10-CM | POA: Diagnosis not present

## 2018-01-17 DIAGNOSIS — Z96652 Presence of left artificial knee joint: Secondary | ICD-10-CM | POA: Diagnosis not present

## 2018-01-17 HISTORY — PX: IR US GUIDE VASC ACCESS RIGHT: IMG2390

## 2018-01-17 HISTORY — DX: Other specified symptoms and signs involving the circulatory and respiratory systems: R09.89

## 2018-01-17 HISTORY — PX: IR FLUORO GUIDE PORT INSERTION RIGHT: IMG5741

## 2018-01-17 LAB — CBC WITH DIFFERENTIAL/PLATELET
Basophils Absolute: 0 10*3/uL (ref 0.0–0.1)
Basophils Relative: 0 %
Eosinophils Absolute: 0.2 10*3/uL (ref 0.0–0.7)
Eosinophils Relative: 1 %
HCT: 37.1 % (ref 36.0–46.0)
Hemoglobin: 11.4 g/dL — ABNORMAL LOW (ref 12.0–15.0)
Lymphocytes Relative: 62 %
Lymphs Abs: 11.4 10*3/uL — ABNORMAL HIGH (ref 0.7–4.0)
MCH: 26.2 pg (ref 26.0–34.0)
MCHC: 30.7 g/dL (ref 30.0–36.0)
MCV: 85.3 fL (ref 78.0–100.0)
Monocytes Absolute: 0.9 10*3/uL (ref 0.1–1.0)
Monocytes Relative: 5 %
Neutro Abs: 5.9 10*3/uL (ref 1.7–7.7)
Neutrophils Relative %: 32 %
Platelets: 134 10*3/uL — ABNORMAL LOW (ref 150–400)
RBC: 4.35 MIL/uL (ref 3.87–5.11)
RDW: 15.5 % (ref 11.5–15.5)
WBC: 18.4 10*3/uL — ABNORMAL HIGH (ref 4.0–10.5)

## 2018-01-17 LAB — PROTIME-INR
INR: 1.37
Prothrombin Time: 16.7 seconds — ABNORMAL HIGH (ref 11.4–15.2)

## 2018-01-17 LAB — BASIC METABOLIC PANEL
Anion gap: 10 (ref 5–15)
BUN: 25 mg/dL — ABNORMAL HIGH (ref 6–20)
CO2: 28 mmol/L (ref 22–32)
Calcium: 9.1 mg/dL (ref 8.9–10.3)
Chloride: 104 mmol/L (ref 101–111)
Creatinine, Ser: 1.27 mg/dL — ABNORMAL HIGH (ref 0.44–1.00)
GFR calc Af Amer: 48 mL/min — ABNORMAL LOW (ref >60)
GFR calc non Af Amer: 41 mL/min — ABNORMAL LOW (ref >60)
Glucose, Bld: 110 mg/dL — ABNORMAL HIGH (ref 65–99)
Potassium: 3.4 mmol/L — ABNORMAL LOW (ref 3.5–5.1)
Sodium: 142 mmol/L (ref 135–145)

## 2018-01-17 MED ORDER — CEFAZOLIN SODIUM-DEXTROSE 2-4 GM/100ML-% IV SOLN
INTRAVENOUS | Status: AC
  Administered 2018-01-17: 2 g via INTRAVENOUS
  Filled 2018-01-17: qty 100

## 2018-01-17 MED ORDER — FENTANYL CITRATE (PF) 100 MCG/2ML IJ SOLN
INTRAMUSCULAR | Status: AC | PRN
  Administered 2018-01-17 (×2): 50 ug via INTRAVENOUS

## 2018-01-17 MED ORDER — SODIUM CHLORIDE 0.9 % IV SOLN
INTRAVENOUS | Status: DC
  Administered 2018-01-17: 08:00:00 via INTRAVENOUS

## 2018-01-17 MED ORDER — LIDOCAINE-EPINEPHRINE (PF) 2 %-1:200000 IJ SOLN
INTRAMUSCULAR | Status: AC
  Filled 2018-01-17: qty 20

## 2018-01-17 MED ORDER — CEFAZOLIN SODIUM-DEXTROSE 2-4 GM/100ML-% IV SOLN
2.0000 g | INTRAVENOUS | Status: AC
  Administered 2018-01-17: 2 g via INTRAVENOUS

## 2018-01-17 MED ORDER — MIDAZOLAM HCL 2 MG/2ML IJ SOLN
INTRAMUSCULAR | Status: AC
  Filled 2018-01-17: qty 4

## 2018-01-17 MED ORDER — HEPARIN SOD (PORK) LOCK FLUSH 100 UNIT/ML IV SOLN
INTRAVENOUS | Status: AC
  Filled 2018-01-17: qty 5

## 2018-01-17 MED ORDER — LIDOCAINE-EPINEPHRINE (PF) 2 %-1:200000 IJ SOLN
INTRAMUSCULAR | Status: AC | PRN
  Administered 2018-01-17: 20 mL

## 2018-01-17 MED ORDER — FENTANYL CITRATE (PF) 100 MCG/2ML IJ SOLN
INTRAMUSCULAR | Status: AC
  Filled 2018-01-17: qty 2

## 2018-01-17 MED ORDER — MIDAZOLAM HCL 2 MG/2ML IJ SOLN
INTRAMUSCULAR | Status: AC | PRN
  Administered 2018-01-17 (×2): 1 mg via INTRAVENOUS

## 2018-01-17 MED ORDER — LIDOCAINE-EPINEPHRINE (PF) 2 %-1:200000 IJ SOLN
INTRAMUSCULAR | Status: AC | PRN
  Administered 2018-01-17: 10 mL

## 2018-01-17 NOTE — Telephone Encounter (Signed)
Start acyclovir now Prednisone to start after Ibrutinib is finished. Bring all her pills with her on Friday and I will clarify in written instructions

## 2018-01-17 NOTE — H&P (Signed)
Chief Complaint: Patient was seen in consultation today for port palcement at the request of [place]  Referring Physician(s): Heath Lark  Supervising Physician: Sandi Mariscal  Patient Status: [facility] - Out-pt  History of Present Illness: Erin Moore is a 72 y.o. female diagnosed with CLL. She is referred for port placement. PMHx, meds, labs, allergies reviewed. Pt feeling ok, still with some residual cough after getting over a URI. Denies fever, SOB, CP.   Past Medical History:  Diagnosis Date  . 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 CLL (chronic lymphocytic leukemia) ([place]) FALL OF 2011  . CLL (chronic lymphocytic leukemia) ([place]) 06/02/2015  . Depression   . Fatigue 07/18/2013  . Hyperlipemia   . Hypertension   . Learning disorder   . Mental disability   . Osteoarthritis   . Pharyngitis 09/18/2013  . Upper respiratory symptom 01/2018    Past Surgical History:  Procedure Laterality Date  . ABDOMINAL HYSTERECTOMY    . BREAST BIOPSY Left 2011  . BREAST REDUCTION SURGERY    . CARPAL TUNNEL RELEASE     BILATERAL  . CESAREAN SECTION    . REDUCTION MAMMAPLASTY Bilateral 1969  . REPLACEMENT TOTAL KNEE     LEFT KNEE  . TONSILLECTOMY    . TUBAL LIGATION  1975   BILATERAL    Allergies: Azithromycin and Ibuprofen [ibuprofen]  Medications: Prior to Admission medications   Medication Sig Start Date End Date Taking? Authorizing Provider  allopurinol (ZYLOPRIM) 300 MG tablet Take 1 tablet (300 mg total) by mouth daily. 01/03/18  Yes Gorsuch, Ni, MD  amLODipine (NORVASC) 10 MG tablet TAKE 1 TABLET (10 MG TOTAL) BY MOUTH DAILY. 12/09/14  Yes Heath Lark, MD  bisoprolol (ZEBETA) 5 MG tablet  08/14/17  Yes [provider]  Carboxymethylcellul-Glycerin (REFRESH OPTIVE SENSITIVE OP) Place 1 drop into both eyes as needed.    Yes [provider]  citalopram (CELEXA) 20 MG tablet Take 20 mg by mouth every morning.  11/11/10  Yes Jonathon Jordan, MD  ibrutinib (IMBRUVICA) 140 MG capsul Take 3 capsules (420 mg total) by mouth daily. 04/11/17  Yes Gorsuch, Ni, MD  loratadine (CLARITIN) 10 MG tablet Take 10 mg daily by mouth.   Yes [provider]  losartan-hydrochlorothiazide (HYZAAR) 100-25 MG tablet  08/16/17  Yes [provider]  Multiple Vitamins-Minerals (PRESERVISION AREDS 2) CAPS Take 1 capsule by mouth 2 (two) times daily.   Yes [provider]  pseudoephedrine-guaifenesin (MUCINEX D) 60-600 MG 12 hr tablet Take 1 tablet by mouth every 12 (twelve) hours.   Yes [provider]  Vitamin D, Ergocalciferol, 2000 units CAPS Take by mouth.   Yes [provider]  acyclovir (ZOVIRAX) 400 MG tablet Take 1 tablet (400 mg total) by mouth daily. 01/09/18   Heath Lark, MD  lidocaine-prilocaine (EMLA) cream Apply to affected area once 01/09/18   Heath Lark, MD  ondansetron (ZOFRAN) 8 MG tablet Take 1 tablet (8 mg total) by mouth every 8 (eight) hours as needed. Then take as needed for nausea or vomiting. 01/09/18   Heath Lark, MD  predniSONE (DELTASONE) 50 MG tablet Take 1 tablet (50 mg total) by mouth daily with breakfast. Start the day after the last dose of Ibrutinib is finished. 01/09/18   Heath Lark, MD  prochlorperazine (COMPAZINE) 10 MG tablet Take 1 tablet (10 mg total) by mouth every 6 (six) hours as needed (Nausea or vomiting). 01/09/18   Heath Lark, MD  tiZANidine (ZANAFLEX) 2  MG tablet Take by mouth every 6 (six) hours as needed for muscle spasms.    [provider]     Family History  Problem Relation Age of Onset  . Breast cancer Mother     Social History   Socioeconomic History  . Marital status: Divorced    Spouse name: Not on file  . Number of children: Not on file  . Years of education: Not on file  . Highest education level: Not on file  Occupational History  . Not on file  Social Needs  . Financial resource strain: Not on file  . Food insecurity:     Worry: Not on file    Inability: Not on file  . Transportation needs:    Medical: Not on file    Non-medical: Not on file  Tobacco Use  . Smoking status: Never Smoker  . Smokeless tobacco: Never Used  Substance and Sexual Activity  . Alcohol use: No  . Drug use: No  . Sexual activity: Not on file  Lifestyle  . Physical activity:    Days per week: Not on file    Minutes per session: Not on file  . Stress: Not on file  Relationships  . Social connections:    Talks on phone: Not on file    Gets together: Not on file    Attends religious service: Not on file    Active member of club or organization: Not on file    Attends meetings of clubs or organizations: Not on file    Relationship status: Not on file  Other Topics Concern  . Not on file  Social History Narrative  . Not on file     Review of Systems: A 12 point ROS discussed and pertinent positives are indicated in the HPI above.  All other systems are negative.  Review of Systems  Vital Signs: There were no vitals taken for this visit.  Physical Exam  Constitutional: She is oriented to person, place, and time. She appears well-developed. No distress.  HENT:  Head: Normocephalic.  Mouth/Throat: Oropharynx is clear and moist.  Neck: Normal range of motion. No JVD present.  Cardiovascular: Normal rate, regular rhythm and normal heart sounds.  Pulmonary/Chest: Effort normal. No respiratory distress. She has wheezes.  Neurological: She is alert and oriented to person, place, and time.  Skin: Skin is warm and dry.  Psychiatric: She has a normal mood and affect.    Imaging: Ct Chest W Contrast  Result Date: 12/30/2017 CLINICAL DATA:  Chronic lymphocytic leukemia. EXAM: CT CHEST, ABDOMEN, AND PELVIS WITH CONTRAST TECHNIQUE: Multidetector CT imaging of the chest, abdomen and pelvis was performed following the standard protocol during bolus administration of intravenous contrast. CONTRAST:  59mL ISOVUE-300 IOPAMIDOL  (ISOVUE-300) INJECTION 61% COMPARISON:  06/14/2016. FINDINGS: CT CHEST FINDINGS Cardiovascular: The heart size is normal. No pericardial effusion. Aortic atherosclerosis. Calcification in the RCA, LAD coronary artery noted. Mediastinum/Nodes: Normal appearance of the thyroid gland. The trachea appears patent and is midline. Normal appearance of the esophagus. Recurrent thoracic adenopathy identified. Index right axillary node measures 1.3 cm, image 8 of series 2. Left axillary node measures 1.4 cm, image 10/series 2. Subcarinal lymph node measures 1.4 cm, image 23/series 2. Right hilar node measures 1.4 cm, image 27/2. Lungs/Pleura: No pleural effusion. Bilateral peripheral and lower lobe predominant interstitial reticulation and pleural thickening is identified. New from previous exam. Pulmonary nodule in the right middle lobe measures 4 mm. Unchanged from previous exam, image 62/6. Musculoskeletal:  There is degenerative disc disease within the thoracic spine. No aggressive lytic or sclerotic bone lesions identified. CT ABDOMEN PELVIS FINDINGS Hepatobiliary: No suspicious liver abnormality. Previous cholecystectomy. Pancreas: Unremarkable. No pancreatic ductal dilatation or surrounding inflammatory changes. Spleen: The spleen measures 11.6 by 6.9 by 17.5 cm (volume = 730 cm^3). No focal splenic abnormality identified. Adrenals/Urinary Tract: Normal appearance of the adrenal glands. No kidney mass or hydronephrosis. Urinary bladder appears normal. Stomach/Bowel: Stomach is within normal limits. Appendix appears normal. No evidence of bowel wall thickening, distention, or inflammatory changes. Vascular/Lymphatic: Aortic atherosclerosis. No aneurysm. Recurrent adenopathy identified within the abdomen and pelvis. Peripancreatic node measures 2.3 cm, image 52/2. Right retroperitoneal/retrocaval node measures 1.6 cm, image 66/2. Left retroperitoneal node measures 2.3 cm, image 73/series 2. Bilateral pelvic adenopathy.  Left pelvic sidewall node measures 3 cm, image 104/series 2. Right pelvic sidewall node measures 2.6 cm, image 105/2. Reproductive: Status post hysterectomy. No adnexal masses. Other: No free fluid or fluid collections. Periumbilical hernia is noted which contains fat only. For Musculoskeletal: Degenerative disc disease identified within the lumbar spine. IMPRESSION: 1. Recurrent adenopathy within the chest, abdomen, and pelvis. 2. Splenomegaly. 3.  Aortic Atherosclerosis (ICD10-I70.0). 4. Coronary artery calcifications. Electronically Signed   By: Kerby Moors M.D.   On: 12/30/2017 09:13   Ct Abdomen Pelvis W Contrast  Result Date: 12/30/2017 CLINICAL DATA:  Chronic lymphocytic leukemia. EXAM: CT CHEST, ABDOMEN, AND PELVIS WITH CONTRAST TECHNIQUE: Multidetector CT imaging of the chest, abdomen and pelvis was performed following the standard protocol during bolus administration of intravenous contrast. CONTRAST:  46mL ISOVUE-300 IOPAMIDOL (ISOVUE-300) INJECTION 61% COMPARISON:  06/14/2016. FINDINGS: CT CHEST FINDINGS Cardiovascular: The heart size is normal. No pericardial effusion. Aortic atherosclerosis. Calcification in the RCA, LAD coronary artery noted. Mediastinum/Nodes: Normal appearance of the thyroid gland. The trachea appears patent and is midline. Normal appearance of the esophagus. Recurrent thoracic adenopathy identified. Index right axillary node measures 1.3 cm, image 8 of series 2. Left axillary node measures 1.4 cm, image 10/series 2. Subcarinal lymph node measures 1.4 cm, image 23/series 2. Right hilar node measures 1.4 cm, image 27/2. Lungs/Pleura: No pleural effusion. Bilateral peripheral and lower lobe predominant interstitial reticulation and pleural thickening is identified. New from previous exam. Pulmonary nodule in the right middle lobe measures 4 mm. Unchanged from previous exam, image 62/6. 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 No aggressive  lytic or sclerotic bone lesions identified. CT ABDOMEN PELVIS FINDINGS Hepatobiliary: No suspicious liver abnormality. Previous cholecystectomy. Pancreas: Unremarkable. No pancreatic ductal dilatation or surrounding inflammatory changes. Spleen: The spleen measures 11.6 by 6.9 by 17.5 cm (volume = 730 cm^3). No focal splenic abnormality identified. Adrenals/Urinary Tract: Normal appearance of the adrenal glands. No kidney mass or hydronephrosis. Urinary bladder appears normal. Stomach/Bowel: Stomach is within normal limits. Appendix appears normal. No evidence of bowel wall thickening, distention, or inflammatory changes. Vascular/Lymphatic: Aortic atherosclerosis. No aneurysm. Recurrent adenopathy identified within the abdomen and pelvis. Peripancreatic node measures 2.3 cm, image 52/2. Right retroperitoneal/retrocaval node measures 1.6 cm, image 66/2. Left retroperitoneal node measures 2.3 cm, image 73/series 2. Bilateral pelvic adenopathy. Left pelvic sidewall node measures 3 cm, image 104/series 2. Right pelvic sidewall node measures 2.6 cm, image 105/2. Reproductive: Status post hysterectomy. No adnexal masses. Other: No free fluid or fluid collections. Periumbilical hernia is noted which contains fat only. For Musculoskeletal: Degenerative disc disease identified within the lumbar spine. IMPRESSION: 1. Recurrent adenopathy within the chest, abdomen, and pelvis. 2. Splenomegaly. 3.  Aortic  Atherosclerosis (ICD10-I70.0). 4. Coronary artery calcifications. Electronically Signed   By: Kerby Moors M.D.   On: 12/30/2017 09:13    Labs:  CBC: Recent Labs    08/23/17 1026 12/22/17 1200 01/02/18 1013 01/17/18 0755  WBC 10.5* 19.4* 20.9* 18.4*  HGB 12.1 11.5* 11.5* 11.4*  HCT 37.2 36.4 36.9 37.1  PLT 148 125* 140* 134*    COAGS: Recent Labs    01/17/18 0755  INR 1.37    BMP: Recent Labs    08/23/17 1026 12/22/17 1200 01/02/18 1109 01/17/18 0755  NA 141 142 144 142  K 3.7 3.6 3.6 3.4*    CL  --  100 104 104  CO2 33* 34* 30* 28  GLUCOSE 92 99 99 110*  BUN 21.0 24 21 25*  CALCIUM 9.3 9.8 9.1 9.1  CREATININE 1.2* 1.30* 1.22* 1.27*  GFRNONAA  --  40* 43* 41*  GFRAA  --  47* 50* 48*    LIVER FUNCTION TESTS: Recent Labs    04/22/17 1017 08/23/17 1026 12/22/17 1200 01/02/18 1109  BILITOT 0.67 0.59 0.5 0.6  AST 11 11 10 11   ALT 13 10 15 14   ALKPHOS 72 66 74 67  PROT 6.2* 6.3* 6.1* 5.6*  ALBUMIN 3.9 3.8 3.7 3.9    TUMOR MARKERS: No results for input(s): AFPTM, CEA, CA199, CHROMGRNA in the last 8760 hours.  Assessment and Plan: CLL For port placement Labs ok Risks and benefits of image guided port-a-catheter placement was discussed with the patient including, but not limited to bleeding, infection, pneumothorax, or fibrin sheath development and need for additional procedures.  All of the patient's questions were answered, patient is agreeable to proceed. Consent signed and in chart.    Thank you for this interesting consult.  I greatly enjoyed meeting Erin Moore and look forward to participating in their care.  A copy of this report was sent to the requesting provider on this date.  Electronically Signed: Ascencion Dike, PA-C 01/17/2018, 8:41 AM   I spent a total of 20 minutes in face to face in clinical consultation, greater than 50% of which was counseling/coordinating care for port placement

## 2018-01-17 NOTE — Procedures (Signed)
Pre Procedure Dx: Poor venous access Post Procedural Dx: Same  Successful placement of right IJ approach port-a-cath with tip at the superior caval atrial junction. The catheter is ready for immediate use.  Estimated Blood Loss: Minimal  Complications: None immediate.  Jay Elliette Seabolt, MD Pager #: [phone]   

## 2018-01-17 NOTE — Telephone Encounter (Signed)
Called with below message to nursing office. Verbalized understanding.

## 2018-01-17 NOTE — Telephone Encounter (Signed)
Nurse office called from [place] requesting call back.  Called back. They already have Emla cream Rx, instructed on how to use. Verbalized understanding. They need clarification on medications. When does she start the Acyclovir? When does she start the prednisone? She still has 101 tablets left of the Ibrutinib. The prednisone states to start after she finishes the Ibrutinib.

## 2018-01-17 NOTE — Discharge Instructions (Signed)
Moderate Conscious Sedation, Adult, Care After °These instructions provide you with information about caring for yourself after your procedure. Your health care provider may also give you more specific instructions. Your treatment has been planned according to current medical practices, but problems sometimes occur. Call your health care provider if you have any problems or questions after your procedure. °What can I expect after the procedure? °After your procedure, it is common: °· To feel sleepy for several hours. °· To feel clumsy and have poor balance for several hours. °· To have poor judgment for several hours. °· To vomit if you eat too soon. ° °Follow these instructions at home: °For at least 24 hours after the procedure: ° °· Do not: °? Participate in activities where you could fall or become injured. °? Drive. °? Use heavy machinery. °? Drink alcohol. °? Take sleeping pills or medicines that cause drowsiness. °? Make important decisions or sign legal documents. °? Take care of children on your own. °· Rest. °Eating and drinking °· Follow the diet recommended by your health care provider. °· If you vomit: °? Drink water, juice, or soup when you can drink without vomiting. °? Make sure you have little or no nausea before eating solid foods. °General instructions °· Have a responsible adult stay with you until you are awake and alert. °· Take over-the-counter and prescription medicines only as told by your health care provider. °· If you smoke, do not smoke without supervision. °· Keep all follow-up visits as told by your health care provider. This is important. °Contact a health care provider if: °· You keep feeling nauseous or you keep vomiting. °· You feel light-headed. °· You develop a rash. °· You have a fever. °Get help right away if: °· You have trouble breathing. °This information is not intended to replace advice given to you by your health care provider. Make sure you discuss any questions you have  with your health care provider. °Document Released: 07/25/2013 Document Revised: 03/08/2016 Document Reviewed: 01/24/2016 °Elsevier Interactive Patient Education © 2018 Elsevier Inc. ° ° °Implanted Port Insertion, Care After °This sheet gives you information about how to care for yourself after your procedure. Your health care provider may also give you more specific instructions. If you have problems or questions, contact your health care provider. °What can I expect after the procedure? °After your procedure, it is common to have: °· Discomfort at the port insertion site. °· Bruising on the skin over the port. This should improve over 3-4 days. ° °Follow these instructions at home: °Port care °· After your port is placed, you will get a manufacturer's information card. The card has information about your port. Keep this card with you at all times. °· Take care of the port as told by your health care provider. Ask your health care provider if you or a family member can get training for taking care of the port at home. A home health care nurse may also take care of the port. °· Make sure to remember what type of port you have. °Incision care °· Follow instructions from your health care provider about how to take care of your port insertion site. Make sure you: °? Wash your hands with soap and water before you change your bandage (dressing). If soap and water are not available, use hand sanitizer. °? Change your dressing as told by your health care provider.  You may remove your dressing tomorrow. °? Leave skin glue in place. These skin closures   may need to stay in place for 2 weeks or longer. If adhesive strip edges start to loosen and curl up, you may trim the loose edges. Do not remove adhesive strips completely unless your health care provider tells you to do that.  DO NOT use EMLA cream for 2 weeks after port placement as this cream will remove the surgical glue over your incision.  Check your port insertion  site every day for signs of infection. Check for: ? More redness, swelling, or pain. ? More fluid or blood. ? Warmth. ? Pus or a bad smell. General instructions  Do not take baths, swim, or use a hot tub until your health care provider approves.  You may shower tomorrow.  Do not lift anything that is heavier than 10 lb (4.5 kg) for a week, or as told by your health care provider.  Ask your health care provider when it is okay to: ? Return to work or school. ? Resume usual physical activities or sports.  Do not drive for 24 hours if you were given a medicine to help you relax (sedative).  Take over-the-counter and prescription medicines only as told by your health care provider.  Wear a medical alert bracelet in case of an emergency. This will tell any health care providers that you have a port.  Keep all follow-up visits as told by your health care provider. This is important. Contact a health care provider if:  You cannot flush your port with saline as directed, or you cannot draw blood from the port.  You have a fever or chills.  You have more redness, swelling, or pain around your port insertion site.  You have more fluid or blood coming from your port insertion site.  Your port insertion site feels warm to the touch.  You have pus or a bad smell coming from the port insertion site. Get help right away if:  You have chest pain or shortness of breath.  You have bleeding from your port that you cannot control. Summary  Take care of the port as told by your health care provider.  Change your dressing as told by your health care provider.  Keep all follow-up visits as told by your health care provider. This information is not intended to replace advice given to you by your health care provider. Make sure you discuss any questions you have with your health care provider. Document Released: 07/25/2013 Document Revised: 08/25/2016 Document Reviewed: 08/25/2016 Elsevier  Interactive Patient Education  2017 [employer].

## 2018-01-18 ENCOUNTER — Encounter: Payer: Self-pay | Admitting: Hematology and Oncology

## 2018-01-18 NOTE — Progress Notes (Signed)
Submitted auth request for Ondansetron today.  Status is pending.  °

## 2018-01-19 ENCOUNTER — Other Ambulatory Visit: Payer: Self-pay | Admitting: Hematology and Oncology

## 2018-01-20 ENCOUNTER — Encounter: Payer: Self-pay | Admitting: Hematology and Oncology

## 2018-01-20 ENCOUNTER — Inpatient Hospital Stay: Payer: Medicare Other

## 2018-01-20 ENCOUNTER — Inpatient Hospital Stay (HOSPITAL_BASED_OUTPATIENT_CLINIC_OR_DEPARTMENT_OTHER): Payer: Medicare Other | Admitting: Hematology and Oncology

## 2018-01-20 ENCOUNTER — Inpatient Hospital Stay: Payer: Medicare Other | Attending: Hematology and Oncology

## 2018-01-20 DIAGNOSIS — E790 Hyperuricemia without signs of inflammatory arthritis and tophaceous disease: Secondary | ICD-10-CM | POA: Diagnosis not present

## 2018-01-20 DIAGNOSIS — C9110 Chronic lymphocytic leukemia of B-cell type not having achieved remission: Secondary | ICD-10-CM

## 2018-01-20 DIAGNOSIS — D61818 Other pancytopenia: Secondary | ICD-10-CM

## 2018-01-20 DIAGNOSIS — Z5112 Encounter for antineoplastic immunotherapy: Secondary | ICD-10-CM | POA: Diagnosis not present

## 2018-01-20 DIAGNOSIS — Z5111 Encounter for antineoplastic chemotherapy: Secondary | ICD-10-CM | POA: Insufficient documentation

## 2018-01-20 DIAGNOSIS — Z7189 Other specified counseling: Secondary | ICD-10-CM | POA: Diagnosis not present

## 2018-01-20 DIAGNOSIS — I10 Essential (primary) hypertension: Secondary | ICD-10-CM

## 2018-01-20 LAB — CBC WITH DIFFERENTIAL (CANCER CENTER ONLY)
Basophils Absolute: 0.1 10*3/uL (ref 0.0–0.1)
Basophils Relative: 0 %
Eosinophils Absolute: 0.2 10*3/uL (ref 0.0–0.5)
Eosinophils Relative: 1 %
HCT: 34.6 % — ABNORMAL LOW (ref 34.8–46.6)
Hemoglobin: 10.8 g/dL — ABNORMAL LOW (ref 11.6–15.9)
Lymphocytes Relative: 64 %
Lymphs Abs: 12.8 10*3/uL — ABNORMAL HIGH (ref 0.9–3.3)
MCH: 26.7 pg (ref 25.1–34.0)
MCHC: 31.2 g/dL — ABNORMAL LOW (ref 31.5–36.0)
MCV: 85.4 fL (ref 79.5–101.0)
Monocytes Absolute: 1 10*3/uL — ABNORMAL HIGH (ref 0.1–0.9)
Monocytes Relative: 5 %
Neutro Abs: 6 10*3/uL (ref 1.5–6.5)
Neutrophils Relative %: 30 %
Platelet Count: 120 10*3/uL — ABNORMAL LOW (ref 145–400)
RBC: 4.05 MIL/uL (ref 3.70–5.45)
RDW: 15.9 % — ABNORMAL HIGH (ref 11.2–14.5)
WBC Count: 20 10*3/uL — ABNORMAL HIGH (ref 3.9–10.3)

## 2018-01-20 LAB — CMP (CANCER CENTER ONLY)
ALT: 10 U/L (ref 0–55)
AST: 11 U/L (ref 5–34)
Albumin: 3.6 g/dL (ref 3.5–5.0)
Alkaline Phosphatase: 64 U/L (ref 40–150)
Anion gap: 9 (ref 3–11)
BUN: 23 mg/dL (ref 7–26)
CO2: 29 mmol/L (ref 22–29)
Calcium: 9.4 mg/dL (ref 8.4–10.4)
Chloride: 102 mmol/L (ref 98–109)
Creatinine: 1.15 mg/dL — ABNORMAL HIGH (ref 0.60–1.10)
GFR, Est AFR Am: 54 mL/min — ABNORMAL LOW (ref >60)
GFR, Estimated: 46 mL/min — ABNORMAL LOW (ref >60)
Glucose, Bld: 95 mg/dL (ref 70–140)
Potassium: 3.5 mmol/L (ref 3.5–5.1)
Sodium: 140 mmol/L (ref 136–145)
Total Bilirubin: 0.6 mg/dL (ref 0.2–1.2)
Total Protein: 5.8 g/dL — ABNORMAL LOW (ref 6.4–8.3)

## 2018-01-20 LAB — RETICULOCYTES
RBC.: 4.05 MIL/uL (ref 3.70–5.45)
Retic Count, Absolute: 93.2 10*3/uL — ABNORMAL HIGH (ref 33.7–90.7)
Retic Ct Pct: 2.3 % — ABNORMAL HIGH (ref 0.7–2.1)

## 2018-01-20 LAB — URIC ACID: Uric Acid, Serum: 5.9 mg/dL (ref 2.6–7.4)

## 2018-01-20 MED ORDER — SODIUM CHLORIDE 0.9% FLUSH
10.0000 mL | Freq: Once | INTRAVENOUS | Status: AC
  Administered 2018-01-20: 10 mL
  Filled 2018-01-20: qty 10

## 2018-01-20 MED ORDER — RASBURICASE 1.5 MG IV SOLR
3.0000 mg | Freq: Once | INTRAVENOUS | Status: AC
  Administered 2018-01-20: 3 mg via INTRAVENOUS
  Filled 2018-01-20: qty 2

## 2018-01-20 MED ORDER — HEPARIN SOD (PORK) LOCK FLUSH 100 UNIT/ML IV SOLN
500.0000 [IU] | Freq: Once | INTRAVENOUS | Status: AC
  Administered 2018-01-20: 500 [IU]
  Filled 2018-01-20: qty 5

## 2018-01-20 MED ORDER — OXYCODONE HCL 5 MG PO TABS: 5.0000 mg | ORAL_TABLET | ORAL | 0 refills | Status: AC | PRN

## 2018-01-20 NOTE — Patient Instructions (Signed)
Rasburicase Injection What is this medicine? RASBURICASE (ras BURE i kase) breaks down uric acid in the blood. It is used to prevent and to treat high levels of uric acid caused by cancer treatment. This medicine may be used for other purposes; ask your health care provider or pharmacist if you have questions. COMMON BRAND NAME(S): Elitek What should I tell my health care provider before I take this medicine? They need to know if you have any of these conditions: -G6PD deficiency -history of anemia -history of blood transfusion -an unusual or allergic reaction to rasburicase, yeast, mannitol, other medicines, foods, dyes, or preservatives -pregnant or trying to get pregnant -breast-feeding How should I use this medicine? This medicine is for infusion into a vein. It is given by a health care professional in a hospital or clinic setting. Talk to your pediatrician regarding the use of this medicine in children. While this drug may be prescribed for children as young as 31 month old for selected conditions, precautions do apply. Overdosage: If you think you have taken too much of this medicine contact a poison control center or emergency room at once. NOTE: This medicine is only for you. Do not share this medicine with others. What if I miss a dose? This does not apply. What may interact with this medicine? Interactions have not been studied. Give your health care provider a list of all the medicines, herbs, non-prescription drugs, or dietary supplements you use. Also tell them if you smoke, drink alcohol, or use illegal drugs. Some items may interact with your medicine. This list may not describe all possible interactions. Give your health care provider a list of all the medicines, herbs, non-prescription drugs, or dietary supplements you use. Also tell them if you smoke, drink alcohol, or use illegal drugs. Some items may interact with your medicine. What should I watch for while using this  medicine? Your condition will be monitored carefully while you are receiving this medicine. You will need to have regular blood tests during your treatment. What side effects may I notice from receiving this medicine? Side effects that you should report to your doctor or health care professional as soon as possible: -allergic reactions like skin rash, itching or hives, swelling of the face, lips, or tongue -blue color to lips or nailbeds -breathing problems -chest pain, tightness -confusion -fast, irregular heartbeat -feeling faint or lightheaded, falls -low blood pressure -seizures -unusually weak or tired -yellowing of the eyes or skin Side effects that usually do not require medical attention (report to your doctor or health care professional if they continue or are bothersome): -abdominal pain -constipation -diarrhea -fever -headache -nausea, vomiting -swelling of the ankles, feet, hands This list may not describe all possible side effects. Call your doctor for medical advice about side effects. You may report side effects to FDA at 1-800-FDA-1088. Where should I keep my medicine? This drug is given in a hospital or clinic and will not be stored at home. NOTE: This sheet is a summary. It may not cover all possible information. If you have questions about this medicine, talk to your doctor, pharmacist, or health care provider.  2018 Elsevier/Gold Standard (2014-03-29 13:24:23)

## 2018-01-20 NOTE — Assessment & Plan Note (Signed)
Her blood pressure is elevated We discussed dietary modification We will monitor closely

## 2018-01-20 NOTE — Assessment & Plan Note (Signed)
We will proceed with rasburicase along with allopurinol

## 2018-01-20 NOTE — Progress Notes (Signed)
[place] OFFICE PROGRESS NOTE  Patient Care Team: Jonathon Jordan, MD as PCP - General (Family Medicine)  ASSESSMENT & PLAN:  CLL (chronic lymphocytic leukemia) ([place]) We reviewed the NCCN guidelines. Treatment goal is palliative  Obinutuzumab plus Bendamustine Followed by Obinutuzumab Maintenance Prolongs Overall Survival Compared with Bendamustine Alone in Patients with Rituximab-Refractory Indolent Non-Hodgkin Lymphoma: Updated Results of the [place] D. Augustin Schooling, Marek Trn?n, Kamal Judye Bos, Rosalita Chessman, Rickard Patience, Pieternella J [place], [facility], Gilles A. Elmer Ramp Fingerle-Rowson, Federico Spencer, Grayland Ormond Wassner-Fritsch and Rosanne Gutting  Blood 2016 [id number];   Background: Treatment options for patients (pts) with relapsed or refractory indolent non-Hodgkin lymphoma (iNHL) are limited. GADOLIN ([id number]) is an open-label, randomized, Phase 3 trial comparing the efficacy and safety of obinutuzumab (GA101; GAZYVA/GAZYVARO; G) plus bendamustine (B) induction, followed by G maintenance (G-B arm), with B induction (standard of care) in rituximab-refractory iNHL pts. In the primary analysis, which involved all pts enrolled as of June 18, 2013 (n=396; median observation time, 21.0 months [mo]), median Independent Review Committee (IRC)-assessed progression-free survival (PFS; primary endpoint) was longer in the G-B arm (194 pts; median not reached) than in the B arm (202 pts; 14.9 mo), with a 45% reduction in risk of progression or death (HR 0.55; 95% CI 0.40, 0.74; p=0.0001). Investigator (INV)-assessed PFS was also significantly longer in the G-B arm, but overall survival (OS) data were immature. Safety profiles were comparable. The most common grade ?3 adverse events (AEs) were neutropenia, thrombocytopenia, anemia, and infusion-related reactions (IRRs). Seventeen additional pts were enrolled after the data cut-off for the primary analysis.  Here, we report updated time-to-event and safety results from a planned analysis of all pts (n=413) using a data cut-off of January 17, 2015.  Methods: Enrolled pts were aged ?18 years (yrs) with documented rituximab-refractory iNHL and an ECOG performance status of 0-2. Pts received either G 1056m i.v. (days [D] 1, 8, and 15 of cycle [C] 1, and D1 of C2-6) plus B 925mm2/day i.v. (D1 and 2 of C1-6), or B monotherapy (12034m2/day i.v., D1 and 2 of C1-6); each cycle was 28 days. Following induction, pts in the G-B arm without evidence of progression received G maintenance (1000m58mv. every 2 mo for 2 yrs or until disease progression, whichever occurred first). In the current analysis, assessments included INV-assessed PFS, OS, time to new anti-lymphoma treatment (TTNT), and safety. Efficacy assessment was performed on the intent-to-treat (ITT) population. Safety analysis included all pts who received any study treatment, excluding 2 pts who crossed over to G-B during maintenance.  Results: Of 413 iNHL pts in the ITT population (G-B, 204; B, 209), 335 (G-B, 164; B, 171) had FL. Baseline characteristics of the ITT population were balanced between arms. Median number of prior regimens was 2 in both arms (pts with ?2 prior regimens: G-B, 80.4%; B, 77.5%). Most pts were refractory to their last regimen (G-B, 92.2%; B, 92.3%). After 31.8 mo median follow-up, median INV-assessed PFS was 25.8 mo in the G-B arm and 14.1 mo in the B arm; HR was 0.57 (95% CI 0.44, 0.73; p<0.0001), i.e. a 43% reduction in risk of progression or death for G-B relative to B. Fewer pts died in the G-B arm (25.5%) than the B arm (34.9%), with a HR for OS of 0.67 (95% CI 0.47, 0.96; p=0.0269; risk reduction, 33%; Figure 1A); median OS was not reached for either arm. Results for FL pts were consistent with those for ITT pts (median PFS: 25.3 vs.  14.0 mo [HR 0.52; 95% CI 0.39, 0.69; p<0.0001]; median OS: not reached vs. 53.9 mo [HR 0.58; 95% CI  0.39, 0.86; p=0.0061; Figure 1B]). Median TTNT was also longer in the G-B arm than in the B arm (ITT pts: 40.8 vs. 19.4 mo, respectively [HR 0.59; 95% CI 0.45, 0.77]; FL pts: 33.6 vs. 18.0 mo, respectively [HR 0.57; 95% CI 0.43, 0.75]). The overall safety profile of G-B remained consistent with results of the primary analysis. In the ITT population, there were more grade ?3 AEs with G-B than with B (72.5% vs. 65.5%, respectively), notably neutropenia (34.8% vs. 27.1%) and IRRs (9.3% vs. 3.5%); grade ?3 thrombocytopenia (10.8% vs. 15.8%) and anemia (7.4% vs. 10.8%) were less frequent in the G-B arm, while grade ?3 infections (22.5 % vs. 19.2%) and secondary malignancies (5.9% vs. 5.4%) were reported with a similar incidence. Serious AEs were more frequent in the G-B arm (43.6% vs. 36.9%), but the incidence of grade 5 (fatal) AEs was similar (7.8% vs. 6.4%). Safety results in FL pts were comparable with those in all iNHL pts.  Conclusions: Updated analysis of the [place] study with ~10 mo additional follow-up confirms the previously reported PFS benefit of G-B over B in pts with rituximab-refractory iNHL, and demonstrates a significant improvement in OS in the G-B arm. No new safety signals were detected  We discussed some of the risks, benefits and side-effects of Gazyva with Bendamustine.   Some of the short term side-effects included, though not limited to, risk of fatigue, weight loss, tumor lysis syndrome, risk of allergic reactions, pancytopenia, life-threatening infections, need for transfusions of blood products, nausea, vomiting, change in bowel habits, admission to hospital for various reasons, and risks of death.   Long term side-effects are also discussed including permanent damage to nerve function, chronic fatigue, and rare secondary malignancy including bone marrow disorders.   The patient is aware that the response rates discussed earlier is not guaranteed.    After a long discussion,  patient made an informed decision to proceed with the prescribed plan of care.   Patient education material was dispensed Due to high risk of tumor lysis syndrome, and recent high uric acid level, she would proceed with rasburicase today and will continue allopurinol I recommend discontinuation of ibrutinib and start her on high-dose prednisone tomorrow until after chemotherapy is completed I will see her on a weekly basis for supportive care and blood count monitoring  Pancytopenia, acquired ([place]) This is likely due to bone marrow disease Would discontinue ibrutinib and switch her over to prednisone therapy  Hyperuricemia We will proceed with rasburicase along with allopurinol  Essential hypertension Her blood pressure is elevated We discussed dietary modification We will monitor closely  Goals of care, counseling/discussion The patient is aware she has incurable disease and treatment is strictly palliative. We discussed importance of Advanced Directives and Living will. Multiple family members are listed as her dedicated medical healthcare power of attorney   No orders of the defined types were placed in this encounter.   INTERVAL HISTORY: Please see below for problem oriented charting. She returns with her knees for further follow-up and chemotherapy consent She is attempting to modify her diet and has lost some weight She tolerated port placement well She has some mild allergy symptoms but no fever or chills No new lymphadenopathy.  SUMMARY OF ONCOLOGIC HISTORY: Oncology History   FISH del 13 q     CLL (chronic lymphocytic leukemia) ([place])   07/02/2010 Procedure  LN biopsy confirmed CLL      08/18/2010 Procedure    Bone marrow biopsy confirmed CLL      11/18/2010 - 04/15/2011 Chemotherapy    patient received 6 cycles of FLudarabine and Rituximab      01/13/2012 Relapse/Recurrence    Repeat BM biopsy confirmed relapse      01/27/2012 - 06/23/2012 Chemotherapy     Bendamustine & Rituximab given, complicated by infusion reaction to Rituximab, completed 6 cycles      06/29/2013 Imaging    Ct scan confirmed disease relapse with bulky lymphadenopathy      07/23/2013 Procedure    Repeat BM biopsy due to thrombocytopenia and progressive leukocytosis      08/02/2013 Relapse/Recurrence    Patient consented to start iburitinib as salvage therapy for relapsed CLL      11/09/2013 Imaging    Repeat CT scan the chest, abdomen and pelvis showed greater than 50% response to treatment      06/17/2014 Imaging    Repeat CT scan showed near complete response to treatment.      05/30/2015 Imaging    Repeat CT scan showed near complete response to treatment.      06/14/2016 Imaging    CT scan showed stable scattered small retroperitoneal lymph nodes and pelvic lymph nodes. No findings for recurrent or progressive lymphoma. Stable mild splenomegaly.      12/30/2017 Imaging    1. Recurrent adenopathy within the chest, abdomen, and pelvis. 2. Splenomegaly. 3. Aortic Atherosclerosis (ICD10-I70.0). 4. Coronary artery calcifications.      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 The catheter is ready for immediate use.       REVIEW OF SYSTEMS:   Constitutional: Denies fevers, chills or abnormal weight loss Eyes: Denies blurriness of vision Ears, nose, mouth, throat, and face: Denies mucositis or sore throat Respiratory: Denies cough, dyspnea or wheezes Cardiovascular: Denies palpitation, chest discomfort or lower extremity swelling Gastrointestinal:  Denies nausea, heartburn or change in bowel habits Skin: Denies abnormal skin rashes Lymphatics: Denies new lymphadenopathy or easy bruising Neurological:Denies numbness, tingling or new weaknesses Behavioral/Psych: Mood is stable, no new changes   All other systems were reviewed with the patient and are negative.  I have reviewed the past medical history, past surgical history, social history and family history with the patient and they are unchanged from previous note.  ALLERGIES:  is allergic to azithromycin and ibuprofen [ibuprofen].  MEDICATIONS:  Current Outpatient Medications  Medication Sig Dispense Refill  . acyclovir (ZOVIRAX) 400 MG tablet Take 1 tablet (400 mg total) by mouth daily. 30 tablet 5  . allopurinol (ZYLOPRIM) 300 MG tablet Take 1 tablet (300 mg total) by mouth daily. 30 tablet 2  . amLODipine (NORVASC) 10 MG tablet TAKE 1 TABLET (10 MG TOTAL) BY MOUTH DAILY. 90 tablet 1  . bisoprolol (ZEBETA) 5 MG tablet   10  . Carboxymethylcellul-Glycerin (REFRESH OPTIVE SENSITIVE OP) Place 1 drop into both eyes as needed.     . citalopram (CELEXA) 20 MG tablet Take 20 mg by mouth every morning.     . lidocaine-prilocaine (EMLA) cream Apply to affected area once 30 g 3  . loratadine (CLARITIN) 10 MG tablet Take 10 mg daily by mouth.    . losartan-hydrochlorothiazide (HYZAAR) 100-25 MG tablet   9  .  Multiple Vitamins-Minerals (PRESERVISION AREDS 2) CAPS Take 1 capsule by mouth 2 (two) times daily.    . ondansetron (ZOFRAN) 8 MG tablet Take 1 tablet (8 mg total) by mouth every 8 (eight) hours as needed. Then take as needed for nausea or vomiting. 30 tablet 1  . oxyCODONE (OXY IR/ROXICODONE) 5 MG immediate release tablet Take 1 tablet (5 mg total) by mouth every 4 (four) hours as needed for up to 5 days for severe pain. 50 tablet 0  . predniSONE (DELTASONE) 50 MG tablet Take 1 tablet (50 mg total) by mouth daily with breakfast. Start the day after the last dose of Ibrutinib is finished. 30 tablet 0  . prochlorperazine (COMPAZINE) 10 MG tablet Take 1 tablet (10 mg total) by mouth every 6 (six) hours as needed (Nausea or vomiting). 30 tablet 1  . pseudoephedrine-guaifenesin (MUCINEX D) 60-600 MG 12 hr tablet Take 1 tablet by mouth  every 12 (twelve) hours.    Marland Kitchen tiZANidine (ZANAFLEX) 2 MG tablet Take by mouth every 6 (six) hours as needed for muscle spasms.    . Vitamin D, Ergocalciferol, 2000 units CAPS Take by mouth.     No current facility-administered medications for this visit.     PHYSICAL EXAMINATION: ECOG PERFORMANCE STATUS: 1 - Symptomatic but completely ambulatory  Vitals:   01/20/18 1145  BP: (!) 158/80  Pulse: 64  Resp: 18  Temp: 98.3 F (36.8 C)  SpO2: 96%   Filed Weights   01/20/18 1145  Weight: 292 lb 12.8 oz (132.8 kg)    GENERAL:alert, no distress and comfortable SKIN: skin color, texture, turgor are normal, no rashes or significant lesions EYES: normal, Conjunctiva are pink and non-injected, sclera clear OROPHARYNX:no exudate, no erythema and lips, buccal mucosa, and tongue normal  NECK: supple, thyroid normal size, non-tender, without nodularity LYMPH:  no palpable lymphadenopathy in the cervical, axillary or inguinal LUNGS: clear to auscultation and percussion with normal breathing effort HEART: regular rate & rhythm and no murmurs and no lower extremity edema ABDOMEN:abdomen soft, non-tender and normal bowel sounds Musculoskeletal:no cyanosis of digits and no clubbing  NEURO: alert & oriented x 3 with fluent speech, no focal motor/sensory deficits  LABORATORY DATA:  I have reviewed the data as listed    Component Value Date/Time   NA 140 01/20/2018 0909   NA 141 08/23/2017 1026   K 3.5 01/20/2018 0909   K 3.7 08/23/2017 1026   CL 102 01/20/2018 0909   CL 98 02/02/2013 1353   CO2 29 01/20/2018 0909   CO2 33 (H) 08/23/2017 1026   GLUCOSE 95 01/20/2018 0909   GLUCOSE 92 08/23/2017 1026   GLUCOSE 99 02/02/2013 1353   BUN 23 01/20/2018 0909   BUN 21.0 08/23/2017 1026   CREATININE 1.15 (H) 01/20/2018 0909   CREATININE 1.2 (H) 08/23/2017 1026   CALCIUM 9.4 01/20/2018 0909   CALCIUM 9.3 08/23/2017 1026   PROT 5.8 (L) 01/20/2018 0909   PROT 6.3 (L) 08/23/2017 1026   ALBUMIN  3.6 01/20/2018 0909   ALBUMIN 3.8 08/23/2017 1026   AST 11 01/20/2018 0909   AST 11 08/23/2017 1026   ALT 10 01/20/2018 0909   ALT 10 08/23/2017 1026   ALKPHOS 64 01/20/2018 0909   ALKPHOS 66 08/23/2017 1026   BILITOT 0.6 01/20/2018 0909   BILITOT 0.59 08/23/2017 1026   GFRNONAA 46 (L) 01/20/2018 0909   GFRAA 54 (L) 01/20/2018 0909    No results found for: SPEP, UPEP  Lab Results  Component  Value Date   WBC 20.0 (H) 01/20/2018   NEUTROABS 6.0 01/20/2018   HGB 11.4 (L) 01/17/2018   HCT 34.6 (L) 01/20/2018   MCV 85.4 01/20/2018   PLT 120 (L) 01/20/2018      Chemistry      Component Value Date/Time   NA 140 01/20/2018 0909   NA 141 08/23/2017 1026   K 3.5 01/20/2018 0909   K 3.7 08/23/2017 1026   CL 102 01/20/2018 0909   CL 98 02/02/2013 1353   CO2 29 01/20/2018 0909   CO2 33 (H) 08/23/2017 1026   BUN 23 01/20/2018 0909   BUN 21.0 08/23/2017 1026   CREATININE 1.15 (H) 01/20/2018 0909   CREATININE 1.2 (H) 08/23/2017 1026      Component Value Date/Time   CALCIUM 9.4 01/20/2018 0909   CALCIUM 9.3 08/23/2017 1026   ALKPHOS 64 01/20/2018 0909   ALKPHOS 66 08/23/2017 1026   AST 11 01/20/2018 0909   AST 11 08/23/2017 1026   ALT 10 01/20/2018 0909   ALT 10 08/23/2017 1026   BILITOT 0.6 01/20/2018 0909   BILITOT 0.59 08/23/2017 1026       RADIOGRAPHIC STUDIES: I have personally reviewed the radiological images as listed and agreed with the findings in the report. Ct Chest W Contrast  Result Date: 12/30/2017 CLINICAL DATA:  Chronic lymphocytic leukemia. EXAM: CT CHEST, ABDOMEN, AND PELVIS WITH CONTRAST TECHNIQUE: Multidetector CT imaging of the chest, abdomen and pelvis was performed following the standard protocol during bolus administration of intravenous contrast. CONTRAST:  66m ISOVUE-300 IOPAMIDOL (ISOVUE-300) INJECTION 61% COMPARISON:  06/14/2016. FINDINGS: CT CHEST FINDINGS Cardiovascular: The heart size is normal. No pericardial effusion. Aortic  atherosclerosis. Calcification in the RCA, LAD coronary artery noted. Mediastinum/Nodes: Normal appearance of the thyroid gland. The trachea appears patent and is midline. Normal appearance of the esophagus. Recurrent thoracic adenopathy identified. Index right axillary node measures 1.3 cm, image 8 of series 2. Left axillary node measures 1.4 cm, image 10/series 2. Subcarinal lymph node measures 1.4 cm, image 23/series 2. Right hilar node measures 1.4 cm, image 27/2. Lungs/Pleura: No pleural effusion. Bilateral peripheral and lower lobe predominant interstitial reticulation and pleural thickening is identified. New from previous exam. Pulmonary nodule in the right middle lobe measures 4 mm. Unchanged from previous exam, image 62/6. 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 No aggressive lytic or sclerotic bone lesions identified. CT ABDOMEN PELVIS FINDINGS Hepatobiliary: No suspicious liver abnormality. Previous cholecystectomy. Pancreas: Unremarkable. No pancreatic ductal dilatation or surrounding inflammatory changes. Spleen: The spleen measures 11.6 by 6.9 by 17.5 cm (volume = 730 cm^3). No focal splenic abnormality identified. Adrenals/Urinary Tract: Normal appearance of the adrenal glands. No kidney mass or hydronephrosis. Urinary bladder appears normal. Stomach/Bowel: Stomach is within normal limits. Appendix appears normal. No evidence of bowel wall thickening, distention, or inflammatory changes. Vascular/Lymphatic: Aortic atherosclerosis. No aneurysm. Recurrent adenopathy identified within the abdomen and pelvis. Peripancreatic node measures 2.3 cm, image 52/2. Right retroperitoneal/retrocaval node measures 1.6 cm, image 66/2. Left retroperitoneal node measures 2.3 cm, image 73/series 2. Bilateral pelvic adenopathy. Left pelvic sidewall node measures 3 cm, image 104/series 2. Right pelvic sidewall node measures 2.6 cm, image 105/2. Reproductive: Status post  hysterectomy. No adnexal masses. Other: No free fluid or fluid collections. Periumbilical hernia is noted which contains fat only. For Musculoskeletal: Degenerative disc disease identified within the lumbar spine. IMPRESSION: 1. Recurrent adenopathy within the chest, abdomen, and pelvis. 2. Splenomegaly. 3.  Aortic Atherosclerosis (ICD10-I70.0). 4. Coronary  artery calcifications. Electronically Signed   By: Kerby Moors M.D.   On: 12/30/2017 09:13   Ct Abdomen Pelvis W Contrast  Result Date: 12/30/2017 CLINICAL DATA:  Chronic lymphocytic leukemia. EXAM: CT CHEST, ABDOMEN, AND PELVIS WITH CONTRAST TECHNIQUE: Multidetector CT imaging of the chest, abdomen and pelvis was performed following the standard protocol during bolus administration of intravenous contrast. CONTRAST:  85m ISOVUE-300 IOPAMIDOL (ISOVUE-300) INJECTION 61% COMPARISON:  06/14/2016. FINDINGS: CT CHEST FINDINGS Cardiovascular: The heart size is normal. No pericardial effusion. Aortic atherosclerosis. Calcification in the RCA, LAD coronary artery noted. Mediastinum/Nodes: Normal appearance of the thyroid gland. The trachea appears patent and is midline. Normal appearance of the esophagus. Recurrent thoracic adenopathy identified. Index right axillary node measures 1.3 cm, image 8 of series 2. Left axillary node measures 1.4 cm, image 10/series 2. Subcarinal lymph node measures 1.4 cm, image 23/series 2. Right hilar node measures 1.4 cm, image 27/2. Lungs/Pleura: No pleural effusion. Bilateral peripheral and lower lobe predominant interstitial reticulation and pleural thickening is identified. New from previous exam. Pulmonary nodule in the right middle lobe measures 4 mm. Unchanged from previous exam, image 62/6. 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 No aggressive lytic or sclerotic bone lesions identified. CT ABDOMEN PELVIS FINDINGS Hepatobiliary: No suspicious liver abnormality. Previous cholecystectomy.  Pancreas: Unremarkable. No pancreatic ductal dilatation or surrounding inflammatory changes. Spleen: The spleen measures 11.6 by 6.9 by 17.5 cm (volume = 730 cm^3). No focal splenic abnormality identified. Adrenals/Urinary Tract: Normal appearance of the adrenal glands. No kidney mass or hydronephrosis. Urinary bladder appears normal. Stomach/Bowel: Stomach is within normal limits. Appendix appears normal. No evidence of bowel wall thickening, distention, or inflammatory changes. Vascular/Lymphatic: Aortic atherosclerosis. No aneurysm. Recurrent adenopathy identified within the abdomen and pelvis. Peripancreatic node measures 2.3 cm, image 52/2. Right retroperitoneal/retrocaval node measures 1.6 cm, image 66/2. Left retroperitoneal node measures 2.3 cm, image 73/series 2. Bilateral pelvic adenopathy. Left pelvic sidewall node measures 3 cm, image 104/series 2. Right pelvic sidewall node measures 2.6 cm, image 105/2. Reproductive: Status post hysterectomy. No adnexal masses. Other: No free fluid or fluid collections. Periumbilical hernia is noted which contains fat only. For Musculoskeletal: Degenerative disc disease identified within the lumbar spine. IMPRESSION: 1. Recurrent adenopathy within the chest, abdomen, and pelvis. 2. Splenomegaly. 3.  Aortic Atherosclerosis (ICD10-I70.0). 4. Coronary artery calcifications. Electronically Signed   By: TKerby MoorsM.D.   On: 12/30/2017 09:13   Ir [place] Vasc Access Right  Result Date: 01/17/2018 WSandi Mariscal MD     01/17/2018 10:41 AM Pre Procedure Dx: Poor venous access Post Procedural Dx: Same Successful placement of right IJ approach port-a-cath with tip at the superior caval atrial junction. The catheter is ready for immediate use. Estimated Blood Loss: Minimal Complications: None immediate. JRonny Bacon MD Pager #: [phone]  I[place]leansInsertion Right  Result Date: 01/17/2018 WSandi Mariscal MD     01/17/2018 10:41 AM Pre Procedure Dx: Poor venous access  Post Procedural Dx: Same Successful placement of right IJ approach port-a-cath with tip at the superior caval atrial junction. The catheter is ready for immediate use. Estimated Blood Loss: Minimal Complications: None immediate. JRonny Bacon MD Pager #: [phone]   All questions were answered. The patient knows to call the clinic with any problems, questions or concerns. No barriers to learning was detected.  I spent 25 minutes counseling the patient face to face. The total time spent in the appointment was 40 minutes and more  than 50% was on counseling and review of test results  Heath Lark, MD 01/20/2018 1:25 PM

## 2018-01-20 NOTE — Assessment & Plan Note (Signed)
The patient is aware she has incurable disease and treatment is strictly palliative. We discussed importance of Advanced Directives and Living will. Multiple family members are listed as her dedicated medical healthcare power of attorney

## 2018-01-20 NOTE — Progress Notes (Signed)
Per nurse Arna Medici, [place] Medicare lvm stating ondansetron has been approved.

## 2018-01-20 NOTE — Assessment & Plan Note (Signed)
This is likely due to bone marrow disease Would discontinue ibrutinib and switch her over to prednisone therapy

## 2018-01-20 NOTE — Assessment & Plan Note (Signed)
We reviewed the NCCN guidelines. 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 Augustin Schooling, Marek Trn?n, Kamal Judye Bos, Rosalita Chessman, Rickard Patience, Pieternella J [place], [facility], Gilles A. Elmer Ramp [place], Federico [place], Grayland Ormond Wassner-Fritsch and Rosanne Gutting  Blood 2016 [id number];   Background: Treatment options for patients (pts) with relapsed or refractory indolent non-Hodgkin lymphoma (iNHL) are limited. GADOLIN ([id number]) is an open-label, randomized, Phase 3 trial comparing the efficacy and safety of obinutuzumab (GA101; GAZYVA/GAZYVARO; G) plus bendamustine (B) induction, followed by G maintenance (G-B arm), with B induction (standard of care) in rituximab-refractory iNHL pts. In the primary analysis, which involved all pts enrolled as of June 18, 2013 (n=396; median observation time, 21.0 months [mo]), median Independent Review Committee (IRC)-assessed progression-free survival (PFS; primary endpoint) was longer in the G-B arm (194 pts; median not reached) than in the B arm (202 pts; 14.9 mo), with a 45% reduction in risk of progression or death (HR 0.55; 95% CI 0.40, 0.74; p=0.0001). Investigator (INV)-assessed PFS was also significantly longer in the G-B arm, but overall survival (OS) data were immature. Safety profiles were comparable. The most common grade ?3 adverse events (AEs) were neutropenia, thrombocytopenia, anemia, and infusion-related reactions (IRRs). Seventeen additional pts were enrolled after the data cut-off for the primary analysis. Here, we report updated time-to-event and safety results from a planned analysis of all pts (n=413) using a data cut-off of January 17, 2015.  Methods: Enrolled pts were aged ?18 years (yrs)  with documented rituximab-refractory iNHL and an ECOG performance status of 0-2. Pts received either G 1000mg  i.v. (days [D] 1, 8, and 15 of cycle [C] 1, and D1 of C2-6) plus B 90mg /m2/day i.v. (D1 and 2 of C1-6), or B monotherapy (120mg /m2/day i.v., D1 and 2 of C1-6); each cycle was 28 days. Following induction, pts in the G-B arm without evidence of progression received G maintenance (1000mg  i.v. every 2 mo for 2 yrs or until disease progression, whichever occurred first). In the current analysis, assessments included INV-assessed PFS, OS, time to new anti-lymphoma treatment (TTNT), and safety. Efficacy assessment was performed on the intent-to-treat (ITT) population. Safety analysis included all pts who received any study treatment, excluding 2 pts who crossed over to G-B during maintenance.  Results: Of 413 iNHL pts in the ITT population (G-B, 204; B, 209), 335 (G-B, 164; B, 171) had FL. Baseline characteristics of the ITT population were balanced between arms. Median number of prior regimens was 2 in both arms (pts with ?2 prior regimens: G-B, 80.4%; B, 77.5%). Most pts were refractory to their last regimen (G-B, 92.2%; B, 92.3%). After 31.8 mo median follow-up, median INV-assessed PFS was 25.8 mo in the G-B arm and 14.1 mo in the B arm; HR was 0.57 (95% CI 0.44, 0.73; p<0.0001), i.e. a 43% reduction in risk of progression or death for G-B relative to B. Fewer pts died in the G-B arm (25.5%) than the B arm (34.9%), with a HR for OS of 0.67 (95% CI 0.47, 0.96; p=0.0269; risk reduction, 33%; Figure 1A); median OS was not reached for either arm. Results for FL pts were consistent with those for ITT pts (median PFS: 25.3 vs. 14.0 mo [HR 0.52; 95% CI 0.39, 0.69; p<0.0001]; median OS: not reached vs. 53.9 mo [HR 0.58; 95% CI 0.39, 0.86; p=0.0061; Figure 1B]). Median TTNT was also longer  in the G-B arm than in the B arm (ITT pts: 40.8 vs. 19.4 mo, respectively [HR 0.59; 95% CI 0.45, 0.77]; FL pts: 33.6 vs. 18.0 mo,  respectively [HR 0.57; 95% CI 0.43, 0.75]). The overall safety profile of G-B remained consistent with results of the primary analysis. In the ITT population, there were more grade ?3 AEs with G-B than with B (72.5% vs. 65.5%, respectively), notably neutropenia (34.8% vs. 27.1%) and IRRs (9.3% vs. 3.5%); grade ?3 thrombocytopenia (10.8% vs. 15.8%) and anemia (7.4% vs. 10.8%) were less frequent in the G-B arm, while grade ?3 infections (22.5 % vs. 19.2%) and secondary malignancies (5.9% vs. 5.4%) were reported with a similar incidence. Serious AEs were more frequent in the G-B arm (43.6% vs. 36.9%), but the incidence of grade 5 (fatal) AEs was similar (7.8% vs. 6.4%). Safety results in FL pts were comparable with those in all iNHL pts.  Conclusions: Updated analysis of the [place] study with ~10 mo additional follow-up confirms the previously reported PFS benefit of G-B over B in pts with rituximab-refractory iNHL, and demonstrates a significant improvement in OS in the G-B arm. No new safety signals were detected  We discussed some of the risks, benefits and side-effects of Gazyva with Bendamustine.   Some of the short term side-effects included, though not limited to, risk of fatigue, weight loss, tumor lysis syndrome, risk of allergic reactions, pancytopenia, life-threatening infections, need for transfusions of blood products, nausea, vomiting, change in bowel habits, admission to hospital for various reasons, and risks of death.   Long term side-effects are also discussed including permanent damage to nerve function, chronic fatigue, and rare secondary malignancy including bone marrow disorders.   The patient is aware that the response rates discussed earlier is not guaranteed.    After a long discussion, patient made an informed decision to proceed with the prescribed plan of care.   Patient education material was dispensed Due to high risk of tumor lysis syndrome, and recent high uric acid  level, she would proceed with rasburicase today and will continue allopurinol I recommend discontinuation of ibrutinib and start her on high-dose prednisone tomorrow until after chemotherapy is completed I will see her on a weekly basis for supportive care and blood count monitoring

## 2018-01-23 ENCOUNTER — Encounter: Payer: Self-pay | Admitting: Hematology and Oncology

## 2018-01-23 NOTE — Progress Notes (Signed)
Called patient and left voicemail with my contact name and number.  Calling to introduce myself as Arboriculturist and to see if she has financial questions or concerns and to ask if interested in applying for LLS.

## 2018-01-23 NOTE — Progress Notes (Signed)
Received returned call back from patient's niece(Tracey).  Advised her that an updated release of information needs to be completed and that I was calling to introduce myself as her Arboriculturist and to ask if she has met her ded/OOP and or if she needs to apply for any assistance. Her niece states she will have release complete tomorrow and no assistance is needed.

## 2018-01-24 ENCOUNTER — Telehealth: Payer: Self-pay | Admitting: Hematology and Oncology

## 2018-01-24 ENCOUNTER — Other Ambulatory Visit: Payer: Self-pay | Admitting: Hematology and Oncology

## 2018-01-24 ENCOUNTER — Encounter (HOSPITAL_COMMUNITY): Payer: Self-pay | Admitting: Interventional Radiology

## 2018-01-24 ENCOUNTER — Inpatient Hospital Stay: Payer: Medicare Other

## 2018-01-24 VITALS — BP 137/50 | HR 70 | Temp 97.3°F | Resp 16

## 2018-01-24 DIAGNOSIS — C9110 Chronic lymphocytic leukemia of B-cell type not having achieved remission: Secondary | ICD-10-CM

## 2018-01-24 DIAGNOSIS — Z5112 Encounter for antineoplastic immunotherapy: Secondary | ICD-10-CM | POA: Diagnosis not present

## 2018-01-24 DIAGNOSIS — Z5111 Encounter for antineoplastic chemotherapy: Secondary | ICD-10-CM | POA: Diagnosis not present

## 2018-01-24 MED ORDER — DEXAMETHASONE SODIUM PHOSPHATE 100 MG/10ML IJ SOLN
20.0000 mg | Freq: Once | INTRAMUSCULAR | Status: AC
  Administered 2018-01-24: 20 mg via INTRAVENOUS
  Filled 2018-01-24: qty 2

## 2018-01-24 MED ORDER — ACETAMINOPHEN 325 MG PO TABS
650.0000 mg | ORAL_TABLET | Freq: Once | ORAL | Status: AC
  Administered 2018-01-24: 650 mg via ORAL

## 2018-01-24 MED ORDER — PALONOSETRON HCL INJECTION 0.25 MG/5ML
INTRAVENOUS | Status: AC
  Filled 2018-01-24: qty 5

## 2018-01-24 MED ORDER — DIPHENHYDRAMINE HCL 50 MG/ML IJ SOLN
INTRAMUSCULAR | Status: AC
  Filled 2018-01-24: qty 1

## 2018-01-24 MED ORDER — PALONOSETRON HCL INJECTION 0.25 MG/5ML
0.2500 mg | Freq: Once | INTRAVENOUS | Status: AC
Start: 2018-01-24 — End: 2018-01-24
  Administered 2018-01-24: 0.25 mg via INTRAVENOUS

## 2018-01-24 MED ORDER — DIPHENHYDRAMINE HCL 50 MG/ML IJ SOLN
50.0000 mg | Freq: Once | INTRAMUSCULAR | Status: AC
  Administered 2018-01-24: 50 mg via INTRAVENOUS

## 2018-01-24 MED ORDER — SODIUM CHLORIDE 0.9 % IV SOLN
85.0000 mg/m2 | Freq: Once | INTRAVENOUS | Status: AC
  Administered 2018-01-24: 200 mg via INTRAVENOUS
  Filled 2018-01-24: qty 8

## 2018-01-24 MED ORDER — OBINUTUZUMAB CHEMO INJECTION 1000 MG/40ML
1000.0000 mg | Freq: Once | INTRAVENOUS | Status: AC
  Administered 2018-01-24: 1000 mg via INTRAVENOUS
  Filled 2018-01-24: qty 40

## 2018-01-24 MED ORDER — HEPARIN SOD (PORK) LOCK FLUSH 100 UNIT/ML IV SOLN
500.0000 [IU] | Freq: Once | INTRAVENOUS | Status: AC | PRN
  Administered 2018-01-24: 500 [IU]
  Filled 2018-01-24: qty 5

## 2018-01-24 MED ORDER — SODIUM CHLORIDE 0.9 % IV SOLN
Freq: Once | INTRAVENOUS | Status: AC
Start: 2018-01-24 — End: 2018-01-24
  Administered 2018-01-24: 09:00:00 via INTRAVENOUS

## 2018-01-24 MED ORDER — ACETAMINOPHEN 325 MG PO TABS
325.0000 mg | ORAL_TABLET | Freq: Once | ORAL | Status: AC
  Administered 2018-01-24: 325 mg via ORAL

## 2018-01-24 MED ORDER — SODIUM CHLORIDE 0.9% FLUSH
10.0000 mL | INTRAVENOUS | Status: DC | PRN
Start: 2018-01-24 — End: 2018-01-24
  Administered 2018-01-24: 10 mL
  Filled 2018-01-24: qty 10

## 2018-01-24 MED ORDER — ACETAMINOPHEN 325 MG PO TABS
ORAL_TABLET | ORAL | Status: AC
  Filled 2018-01-24: qty 2

## 2018-01-24 MED ORDER — ACETAMINOPHEN 325 MG PO TABS
ORAL_TABLET | ORAL | Status: AC
  Filled 2018-01-24: qty 1

## 2018-01-24 NOTE — Progress Notes (Signed)
Spoke with Kennith Center, PharmD regarding treatment plan. Ginna advised Dr. 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

## 2018-01-24 NOTE — Telephone Encounter (Signed)
I called the patient's medical healthcare power of attorney, Olivia Mackie Her insurance company will not approve combination treatment with Gazyva and bendamustine I recommend discontinuation of bendamustine and stick with Gazyva only.  I would cancel her chemotherapy tomorrow.  She agree.

## 2018-01-24 NOTE — Patient Instructions (Signed)
[facility] Discharge Instructions for Patients Receiving Chemotherapy  Today you received the following chemotherapy agents:  Gazyva and Bendeka.  To help prevent nausea and vomiting after your treatment, we encourage you to take your nausea medication as directed.   If you develop nausea and vomiting that is not controlled by your nausea medication, call the clinic.   BELOW ARE SYMPTOMS THAT SHOULD BE REPORTED IMMEDIATELY:  *FEVER GREATER THAN 100.5 F  *CHILLS WITH OR WITHOUT FEVER  NAUSEA AND VOMITING THAT IS NOT CONTROLLED WITH YOUR NAUSEA MEDICATION  *UNUSUAL SHORTNESS OF BREATH  *UNUSUAL BRUISING OR BLEEDING  TENDERNESS IN MOUTH AND THROAT WITH OR WITHOUT PRESENCE OF ULCERS  *URINARY PROBLEMS  *BOWEL PROBLEMS  UNUSUAL RASH Items with * indicate a potential emergency and should be followed up as soon as possible.  Feel free to call the clinic should you have any questions or concerns. The clinic phone number is [phone].  Please show the [place] at check-in to the Emergency Department and triage nurse.  Obinutuzumab injection What is this medicine? OBINUTUZUMAB (OH bi nue TOOZ ue mab) is a monoclonal antibody. It is used to treat chronic lymphocytic leukemia (CLL) and a type of non-Hodgkin lymphoma (NHL), follicular lymphoma. This medicine may be used for other purposes; ask your health care provider or pharmacist if you have questions. COMMON BRAND NAME(S): GAZYVA What should I tell my health care provider before I take this medicine? They need to know if you have any of these conditions: -infection (especially a virus infection such as hepatitis B virus) -lung or breathing disease -heart disease -take medicines that treat or prevent blood clots -an unusual or allergic reaction to obinutuzumab, other medicines, foods, dyes, or preservatives -pregnant or trying to get pregnant -breast-feeding How should I use this medicine? This  medicine is for infusion into a vein. It is given by a health care professional in a hospital or clinic setting. Talk to your pediatrician regarding the use of this medicine in children. Special care may be needed. Overdosage: If you think you have taken too much of this medicine contact a poison control center or emergency room at once. NOTE: This medicine is only for you. Do not share this medicine with others. What if I miss a dose? Keep appointments for follow-up doses as directed. It is important not to miss your dose. Call your doctor or health care professional if you are unable to keep an appointment. What may interact with this medicine? -live virus vaccines This list may not describe all possible interactions. Give your health care provider a list of all the medicines, herbs, non-prescription drugs, or dietary supplements you use. Also tell them if you smoke, drink alcohol, or use illegal drugs. Some items may interact with your medicine. What should I watch for while using this medicine? Report any side effects that you notice during your treatment right away, such as changes in your breathing, fever, chills, dizziness or lightheadedness. These effects are more common with the first dose. Visit your prescriber or health care professional for checks on your progress. You will need to have regular blood work. Report any other side effects. The side effects of this medicine can continue after you finish your treatment. Continue your course of treatment even though you feel ill unless your doctor tells you to stop. Call your doctor or health care professional for advice if you get a fever, chills or sore throat, or other symptoms of a cold or  flu. Do not treat yourself. This drug decreases your body's ability to fight infections. Try to avoid being around people who are sick. This medicine may increase your risk to bruise or bleed. Call your doctor or health care professional if you notice any  unusual bleeding. What side effects may I notice from receiving this medicine? Side effects that you should report to your doctor or health care professional as soon as possible: -allergic reactions like skin rash, itching or hives, swelling of the face, lips, or tongue -breathing problems -changes in vision -chest pain or chest tightness -confusion -dizziness -loss of balance or coordination -low blood counts - this medicine may decrease the number of white blood cells, red blood cells and platelets. You may be at increased risk for infections and bleeding. -signs of decreased platelets or bleeding - bruising, pinpoint red spots on the skin, black, tarry stools, blood in the urine -signs of infection - fever or chills, cough, sore throat, pain or trouble passing urine -signs and symptoms of liver injury like dark yellow or brown urine; general ill feeling or flu-like symptoms; light-colored stools; loss of appetite; nausea; right upper belly pain; unusually weak or tired; yellowing of the eyes or skin -trouble speaking or understanding -trouble walking -vomiting Side effects that usually do not require medical attention (report to your doctor or health care professional if they continue or are bothersome): -constipation -joint pain -muscle pain This list may not describe all possible side effects. Call your doctor for medical advice about side effects. You may report side effects to FDA at 1-800-FDA-1088. Where should I keep my medicine? This drug is only given in a hospital or clinic and will not be stored at home. NOTE: This sheet is a summary. It may not cover all possible information. If you have questions about this medicine, talk to your doctor, pharmacist, or health care provider.  2018 Elsevier/Gold Standard (2015-11-06 08:54:03)  Bendamustine Injection What is this medicine? BENDAMUSTINE (BEN da MUS teen) is a chemotherapy drug. It is used to treat chronic lymphocytic leukemia  and non-Hodgkin lymphoma. This medicine may be used for other purposes; ask your health care provider or pharmacist if you have questions. COMMON BRAND NAME(S): BENDEKA, Treanda What should I tell my health care provider before I take this medicine? They need to know if you have any of these conditions: -infection (especially a virus infection such as chickenpox, cold sores, or herpes) -kidney disease -liver disease -an unusual or allergic reaction to bendamustine, mannitol, other medicines, foods, dyes, or preservatives -pregnant or trying to get pregnant -breast-feeding How should I use this medicine? This medicine is for infusion into a vein. It is given by a health care professional in a hospital or clinic setting. Talk to your pediatrician regarding the use of this medicine in children. Special care may be needed. Overdosage: If you think you have taken too much of this medicine contact a poison control center or emergency room at once. NOTE: This medicine is only for you. Do not share this medicine with others. What if I miss a dose? It is important not to miss your dose. Call your doctor or health care professional if you are unable to keep an appointment. What may interact with this medicine? Do not take this medicine with any of the following medications: -clozapine This medicine may also interact with the following medications: -atazanavir -cimetidine -ciprofloxacin -enoxacin -fluvoxamine -medicines for seizures like carbamazepine and phenobarbital -mexiletine -rifampin -tacrine -thiabendazole -zileuton This list may not  describe all possible interactions. Give your health care provider a list of all the medicines, herbs, non-prescription drugs, or dietary supplements you use. Also tell them if you smoke, drink alcohol, or use illegal drugs. Some items may interact with your medicine. What should I watch for while using this medicine? This drug may make you feel generally  unwell. This is not uncommon, as chemotherapy can affect healthy cells as well as cancer cells. Report any side effects. Continue your course of treatment even though you feel ill unless your doctor tells you to stop. You may need blood work done while you are taking this medicine. Call your doctor or health care professional for advice if you get a fever, chills or sore throat, or other symptoms of a cold or flu. Do not treat yourself. This drug decreases your body's ability to fight infections. Try to avoid being around people who are sick. This medicine may increase your risk to bruise or bleed. Call your doctor or health care professional if you notice any unusual bleeding. Talk to your doctor about your risk of cancer. You may be more at risk for certain types of cancers if you take this medicine. Do not become pregnant while taking this medicine or for 3 months after stopping it. Women should inform their doctor if they wish to become pregnant or think they might be pregnant. Men should not father a child while taking this medicine and for 3 months after stopping it.There is a potential for serious side effects to an unborn child. Talk to your health care professional or pharmacist for more information. Do not breast-feed an infant while taking this medicine. This medicine may interfere with the ability to have a child. You should talk with your doctor or health care professional if you are concerned about your fertility. What side effects may I notice from receiving this medicine? Side effects that you should report to your doctor or health care professional as soon as possible: -allergic reactions like skin rash, itching or hives, swelling of the face, lips, or tongue -low blood counts - this medicine may decrease the number of white blood cells, red blood cells and platelets. You may be at increased risk for infections and bleeding. -redness, blistering, peeling or loosening of the skin,  including inside the mouth -signs of infection - fever or chills, cough, sore throat, pain or difficulty passing urine -signs of decreased platelets or bleeding - bruising, pinpoint red spots on the skin, black, tarry stools, blood in the urine -signs of decreased red blood cells - unusually weak or tired, fainting spells, lightheadedness -signs and symptoms of kidney injury like trouble passing urine or change in the amount of urine -signs and symptoms of liver injury like dark yellow or brown urine; general ill feeling or flu-like symptoms; light-colored stools; loss of appetite; nausea; right upper belly pain; unusually weak or tired; yellowing of the eyes or skin Side effects that usually do not require medical attention (report to your doctor or health care professional if they continue or are bothersome): -constipation -decreased appetite -diarrhea -headache -mouth sores -nausea/vomiting -tiredness This list may not describe all possible side effects. Call your doctor for medical advice about side effects. You may report side effects to FDA at 1-800-FDA-1088. Where should I keep my medicine? This drug is given in a hospital or clinic and will not be stored at home. NOTE: This sheet is a summary. It may not cover all possible information. If you have questions about  this medicine, talk to your doctor, pharmacist, or health care provider.  2018 Elsevier/Gold Standard (2015-08-07 08:45:41)

## 2018-01-25 ENCOUNTER — Other Ambulatory Visit: Payer: Self-pay | Admitting: Hematology and Oncology

## 2018-01-25 ENCOUNTER — Telehealth: Payer: Self-pay | Admitting: *Deleted

## 2018-01-25 ENCOUNTER — Ambulatory Visit: Payer: Medicare Other

## 2018-01-25 NOTE — Telephone Encounter (Signed)
Spoke with niece regarding chemo follow up. States she has done well. Only complaint this am was a headache and has taken something for it.

## 2018-01-25 NOTE — Telephone Encounter (Signed)
-----   Message from Sinda Du, RN sent at 01/24/2018  8:47 AM EDT ----- Regarding: Dr. Alvy Bimler - 1st chemo f/u 1st chemo f/u

## 2018-01-31 ENCOUNTER — Inpatient Hospital Stay: Payer: Medicare Other

## 2018-01-31 ENCOUNTER — Encounter: Payer: Self-pay | Admitting: Hematology and Oncology

## 2018-01-31 ENCOUNTER — Telehealth: Payer: Self-pay | Admitting: Hematology and Oncology

## 2018-01-31 ENCOUNTER — Inpatient Hospital Stay (HOSPITAL_BASED_OUTPATIENT_CLINIC_OR_DEPARTMENT_OTHER): Payer: Medicare Other | Admitting: Hematology and Oncology

## 2018-01-31 VITALS — BP 140/52 | HR 64 | Temp 98.0°F | Resp 17

## 2018-01-31 DIAGNOSIS — Z5112 Encounter for antineoplastic immunotherapy: Secondary | ICD-10-CM | POA: Diagnosis not present

## 2018-01-31 DIAGNOSIS — D61818 Other pancytopenia: Secondary | ICD-10-CM

## 2018-01-31 DIAGNOSIS — C9110 Chronic lymphocytic leukemia of B-cell type not having achieved remission: Secondary | ICD-10-CM | POA: Diagnosis not present

## 2018-01-31 DIAGNOSIS — Z5111 Encounter for antineoplastic chemotherapy: Secondary | ICD-10-CM | POA: Diagnosis not present

## 2018-01-31 DIAGNOSIS — I10 Essential (primary) hypertension: Secondary | ICD-10-CM

## 2018-01-31 LAB — CBC WITH DIFFERENTIAL (CANCER CENTER ONLY)
Basophils Absolute: 0 10*3/uL (ref 0.0–0.1)
Basophils Relative: 0 %
Eosinophils Absolute: 0.2 10*3/uL (ref 0.0–0.5)
Eosinophils Relative: 5 %
HCT: 34.4 % — ABNORMAL LOW (ref 34.8–46.6)
Hemoglobin: 10.8 g/dL — ABNORMAL LOW (ref 11.6–15.9)
Lymphocytes Relative: 6 %
Lymphs Abs: 0.3 10*3/uL — ABNORMAL LOW (ref 0.9–3.3)
MCH: 27.2 pg (ref 25.1–34.0)
MCHC: 31.4 g/dL — ABNORMAL LOW (ref 31.5–36.0)
MCV: 86.6 fL (ref 79.5–101.0)
Monocytes Absolute: 0.4 10*3/uL (ref 0.1–0.9)
Monocytes Relative: 9 %
Neutro Abs: 3.2 10*3/uL (ref 1.5–6.5)
Neutrophils Relative %: 80 %
Platelet Count: 56 10*3/uL — ABNORMAL LOW (ref 145–400)
RBC: 3.97 MIL/uL (ref 3.70–5.45)
RDW: 17.1 % — ABNORMAL HIGH (ref 11.2–14.5)
WBC Count: 4 10*3/uL (ref 3.9–10.3)

## 2018-01-31 LAB — CMP (CANCER CENTER ONLY)
ALT: 20 U/L (ref 0–55)
AST: 10 U/L (ref 5–34)
Albumin: 3.3 g/dL — ABNORMAL LOW (ref 3.5–5.0)
Alkaline Phosphatase: 56 U/L (ref 40–150)
Anion gap: 9 (ref 3–11)
BUN: 18 mg/dL (ref 7–26)
CO2: 31 mmol/L — ABNORMAL HIGH (ref 22–29)
Calcium: 9.2 mg/dL (ref 8.4–10.4)
Chloride: 99 mmol/L (ref 98–109)
Creatinine: 0.92 mg/dL (ref 0.60–1.10)
GFR, Est AFR Am: >60 mL/min (ref >60)
GFR, Estimated: >60 mL/min (ref >60)
Glucose, Bld: 118 mg/dL (ref 70–140)
Potassium: 3.8 mmol/L (ref 3.5–5.1)
Sodium: 139 mmol/L (ref 136–145)
Total Bilirubin: 0.7 mg/dL (ref 0.2–1.2)
Total Protein: 5.3 g/dL — ABNORMAL LOW (ref 6.4–8.3)

## 2018-01-31 LAB — URIC ACID: Uric Acid, Serum: 5.9 mg/dL (ref 2.6–7.4)

## 2018-01-31 MED ORDER — DEXAMETHASONE SODIUM PHOSPHATE 10 MG/ML IJ SOLN
10.0000 mg | Freq: Once | INTRAMUSCULAR | Status: AC
  Administered 2018-01-31: 10 mg via INTRAVENOUS

## 2018-01-31 MED ORDER — SODIUM CHLORIDE 0.9% FLUSH
10.0000 mL | Freq: Once | INTRAVENOUS | Status: AC
  Administered 2018-01-31: 10 mL
  Filled 2018-01-31: qty 10

## 2018-01-31 MED ORDER — ACETAMINOPHEN 325 MG PO TABS
650.0000 mg | ORAL_TABLET | Freq: Once | ORAL | Status: AC
  Administered 2018-01-31: 650 mg via ORAL

## 2018-01-31 MED ORDER — SODIUM CHLORIDE 0.9% FLUSH
10.0000 mL | INTRAVENOUS | Status: DC | PRN
  Administered 2018-01-31: 10 mL
  Filled 2018-01-31: qty 10

## 2018-01-31 MED ORDER — DIPHENHYDRAMINE HCL 50 MG/ML IJ SOLN
INTRAMUSCULAR | Status: AC
  Filled 2018-01-31: qty 1

## 2018-01-31 MED ORDER — HEPARIN SOD (PORK) LOCK FLUSH 100 UNIT/ML IV SOLN
500.0000 [IU] | Freq: Once | INTRAVENOUS | Status: AC | PRN
  Administered 2018-01-31: 500 [IU]
  Filled 2018-01-31: qty 5

## 2018-01-31 MED ORDER — DEXAMETHASONE SODIUM PHOSPHATE 10 MG/ML IJ SOLN
INTRAMUSCULAR | Status: AC
  Filled 2018-01-31: qty 1

## 2018-01-31 MED ORDER — SODIUM CHLORIDE 0.9 % IV SOLN
Freq: Once | INTRAVENOUS | Status: AC
  Administered 2018-01-31: 10:00:00 via INTRAVENOUS

## 2018-01-31 MED ORDER — SODIUM CHLORIDE 0.9 % IV SOLN: 10.0000 mg | Freq: Once | INTRAVENOUS | Status: DC

## 2018-01-31 MED ORDER — SODIUM CHLORIDE 0.9 % IV SOLN
1000.0000 mg | Freq: Once | INTRAVENOUS | Status: AC
  Administered 2018-01-31: 1000 mg via INTRAVENOUS
  Filled 2018-01-31: qty 40

## 2018-01-31 MED ORDER — ACETAMINOPHEN 325 MG PO TABS
ORAL_TABLET | ORAL | Status: AC
  Filled 2018-01-31: qty 2

## 2018-01-31 MED ORDER — DIPHENHYDRAMINE HCL 50 MG/ML IJ SOLN
50.0000 mg | Freq: Once | INTRAMUSCULAR | Status: AC
  Administered 2018-01-31: 50 mg via INTRAVENOUS

## 2018-01-31 NOTE — Patient Instructions (Addendum)
[facility] Discharge Instructions for Patients Receiving Chemotherapy  Today you received the following chemotherapy agents: Gazyva   To help prevent nausea and vomiting after your treatment, we encourage you to take your nausea medication as directed.    If you develop nausea and vomiting that is not controlled by your nausea medication, call the clinic.   BELOW ARE SYMPTOMS THAT SHOULD BE REPORTED IMMEDIATELY:  *FEVER GREATER THAN 100.5 F  *CHILLS WITH OR WITHOUT FEVER  NAUSEA AND VOMITING THAT IS NOT CONTROLLED WITH YOUR NAUSEA MEDICATION  *UNUSUAL SHORTNESS OF BREATH  *UNUSUAL BRUISING OR BLEEDING  TENDERNESS IN MOUTH AND THROAT WITH OR WITHOUT PRESENCE OF ULCERS  *URINARY PROBLEMS  *BOWEL PROBLEMS  UNUSUAL RASH Items with * indicate a potential emergency and should be followed up as soon as possible.  Feel free to call the clinic should you have any questions or concerns. The clinic phone number is [phone].  Please show the CHEMO ALERT CARD at check-in to the Emergency Department and triage nurse.   

## 2018-01-31 NOTE — Assessment & Plan Note (Signed)
Due to insurance coverage issue, we would proceed with Gazyva only We would proceed regardless of CBC result She is tolerating treatment well with normalization of lymphocytosis Continue supportive care

## 2018-01-31 NOTE — Assessment & Plan Note (Signed)
This is likely due to recent chemotherapy She is not symptomatic Observe only

## 2018-01-31 NOTE — Progress Notes (Signed)
[facility] OFFICE PROGRESS NOTE  Patient Care Team: Jonathon Jordan, MD as PCP - General (Family Medicine)  ASSESSMENT & PLAN:  CLL (chronic lymphocytic leukemia) ([place]) Due to insurance coverage issue, we would proceed with Gazyva only We would proceed regardless of CBC result She is tolerating treatment well with normalization of lymphocytosis Continue supportive care  Pancytopenia, acquired ([place]) This is likely due to recent chemotherapy She is not symptomatic Observe only  Essential hypertension She has significant elevated blood pressure, could be due to anxiety She will continue current prescription antihypertensives   No orders of the defined types were placed in this encounter.   INTERVAL HISTORY: Please see below for problem oriented charting. She returns with her knees for further follow-up and chemotherapy She complained of mild fatigue No recent infection No new lymphadenopathy The patient denies any recent signs or symptoms of bleeding such as spontaneous epistaxis, hematuria or hematochezia.  SUMMARY OF ONCOLOGIC HISTORY: Oncology History   FISH del 13 q     CLL (chronic lymphocytic leukemia) ([place])   07/02/2010 Procedure    LN biopsy confirmed CLL      08/18/2010 Procedure    Bone marrow biopsy confirmed CLL      11/18/2010 - 04/15/2011 Chemotherapy    patient received 6 cycles of FLudarabine and Rituximab      01/13/2012 Relapse/Recurrence    Repeat BM biopsy confirmed relapse      01/27/2012 - 06/23/2012 Chemotherapy    Bendamustine & Rituximab given, complicated by infusion reaction to Rituximab, completed 6 cycles      06/29/2013 Imaging    Ct scan confirmed disease relapse with bulky lymphadenopathy      07/23/2013 Procedure    Repeat BM biopsy due to thrombocytopenia and progressive leukocytosis      08/02/2013 Relapse/Recurrence    Patient consented to start iburitinib as salvage therapy for relapsed CLL      11/09/2013  Imaging    Repeat CT scan the chest, abdomen and pelvis showed greater than 50% response to treatment      06/17/2014 Imaging    Repeat CT scan showed near complete response to treatment.      05/30/2015 Imaging    Repeat CT scan showed near complete response to treatment.      06/14/2016 Imaging    CT scan showed stable scattered small retroperitoneal lymph nodes and pelvic lymph nodes. No findings for recurrent or progressive lymphoma. Stable mild splenomegaly.      12/30/2017 Imaging    1. Recurrent adenopathy within the chest, abdomen, and pelvis. 2. Splenomegaly. 3. Aortic Atherosclerosis (ICD10-I70.0). 4. Coronary artery calcifications.      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 The catheter is ready for immediate use.      01/24/2018 -  Chemotherapy    The patient had Gazyva and 1 dose of bendamustine       REVIEW OF SYSTEMS:   Constitutional: Denies fevers, chills or abnormal weight loss Eyes: Denies blurriness of vision Ears, nose, mouth, throat, and face: Denies mucositis or sore throat Respiratory: Denies cough, dyspnea or wheezes Cardiovascular: Denies palpitation, chest discomfort or lower extremity swelling Gastrointestinal:  Denies nausea, heartburn or change in bowel habits Skin: Denies abnormal skin rashes Lymphatics: Denies new lymphadenopathy or easy bruising Neurological:Denies numbness, tingling or new weaknesses  Behavioral/Psych: Mood is stable, no new changes  All other systems were reviewed with the patient and are negative.  I have reviewed the past medical history, past surgical history, social history and family history with the patient and they are unchanged from previous note.  ALLERGIES:  is allergic to azithromycin and ibuprofen [ibuprofen].  MEDICATIONS:  Current Outpatient  Medications  Medication Sig Dispense Refill  . acyclovir (ZOVIRAX) 400 MG tablet Take 1 tablet (400 mg total) by mouth daily. 30 tablet 5  . allopurinol (ZYLOPRIM) 300 MG tablet Take 1 tablet (300 mg total) by mouth daily. 30 tablet 2  . amLODipine (NORVASC) 10 MG tablet TAKE 1 TABLET (10 MG TOTAL) BY MOUTH DAILY. 90 tablet 1  . bisoprolol (ZEBETA) 5 MG tablet   10  . Carboxymethylcellul-Glycerin (REFRESH OPTIVE SENSITIVE OP) Place 1 drop into both eyes as needed.     . citalopram (CELEXA) 20 MG tablet Take 20 mg by mouth every morning.     . lidocaine-prilocaine (EMLA) cream Apply to affected area once 30 g 3  . loratadine (CLARITIN) 10 MG tablet Take 10 mg daily by mouth.    . losartan-hydrochlorothiazide (HYZAAR) 100-25 MG tablet   9  . Multiple Vitamins-Minerals (PRESERVISION AREDS 2) CAPS Take 1 capsule by mouth 2 (two) times daily.    . ondansetron (ZOFRAN) 8 MG tablet Take 1 tablet (8 mg total) by mouth every 8 (eight) hours as needed. Then take as needed for nausea or vomiting. 30 tablet 1  . predniSONE (DELTASONE) 50 MG tablet Take 1 tablet (50 mg total) by mouth daily with breakfast. Start the day after the last dose of Ibrutinib is finished. 30 tablet 0  . prochlorperazine (COMPAZINE) 10 MG tablet Take 1 tablet (10 mg total) by mouth every 6 (six) hours as needed (Nausea or vomiting). 30 tablet 1  . pseudoephedrine-guaifenesin (MUCINEX D) 60-600 MG 12 hr tablet Take 1 tablet by mouth every 12 (twelve) hours.    Marland Kitchen tiZANidine (ZANAFLEX) 2 MG tablet Take by mouth every 6 (six) hours as needed for muscle spasms.    . Vitamin D, Ergocalciferol, 2000 units CAPS Take by mouth.     No current facility-administered medications for this visit.    Facility-Administered Medications Ordered in Other Visits  Medication Dose Route Frequency Provider Last Rate Last Dose  . heparin lock flush 100 unit/mL  500 Units Intracatheter Once PRN Alvy Bimler, Ercell Razon, MD      . sodium chloride flush (NS) 0.9 %  injection 10 mL  10 mL Intracatheter PRN Alvy Bimler, [facility] Cogliano, MD        PHYSICAL EXAMINATION: ECOG PERFORMANCE STATUS: 1 - Symptomatic but completely ambulatory  Vitals:   01/31/18 0849  BP: (!) 172/66  Pulse: 64  Resp: 18  Temp: 98.4 F (36.9 C)  SpO2: 97%   Filed Weights   01/31/18 0849  Weight: 294 lb 6.4 oz (133.5 kg)    GENERAL:alert, no distress and comfortable SKIN: skin color, texture, turgor are normal, no rashes or significant lesions EYES: normal, Conjunctiva are pink and non-injected, sclera clear OROPHARYNX:no exudate, no erythema and lips, buccal mucosa, and tongue normal  NECK: supple, thyroid normal size, non-tender, without nodularity LYMPH:  no palpable lymphadenopathy in the cervical, axillary or inguinal LUNGS: clear to auscultation and percussion with normal breathing effort HEART: regular rate & rhythm and no murmurs and no lower extremity edema ABDOMEN:abdomen soft, non-tender and normal bowel sounds Musculoskeletal:no cyanosis of digits and no clubbing  NEURO: alert &  oriented x 3 with fluent speech, no focal motor/sensory deficits  LABORATORY DATA:  I have reviewed the data as listed    Component Value Date/Time   NA 139 01/31/2018 0811   NA 141 08/23/2017 1026   K 3.8 01/31/2018 0811   K 3.7 08/23/2017 1026   CL 99 01/31/2018 0811   CL 98 02/02/2013 1353   CO2 31 (H) 01/31/2018 0811   CO2 33 (H) 08/23/2017 1026   GLUCOSE 118 01/31/2018 0811   GLUCOSE 92 08/23/2017 1026   GLUCOSE 99 02/02/2013 1353   BUN 18 01/31/2018 0811   BUN 21.0 08/23/2017 1026   CREATININE 0.92 01/31/2018 0811   CREATININE 1.2 (H) 08/23/2017 1026   CALCIUM 9.2 01/31/2018 0811   CALCIUM 9.3 08/23/2017 1026   PROT 5.3 (L) 01/31/2018 0811   PROT 6.3 (L) 08/23/2017 1026   ALBUMIN 3.3 (L) 01/31/2018 0811   ALBUMIN 3.8 08/23/2017 1026   AST 10 01/31/2018 0811   AST 11 08/23/2017 1026   ALT 20 01/31/2018 0811   ALT 10 08/23/2017 1026   ALKPHOS 56 01/31/2018 0811   ALKPHOS  66 08/23/2017 1026   BILITOT 0.7 01/31/2018 0811   BILITOT 0.59 08/23/2017 1026   GFRNONAA >60 01/31/2018 0811   GFRAA >60 01/31/2018 0811    No results found for: SPEP, UPEP  Lab Results  Component Value Date   WBC 4.0 01/31/2018   NEUTROABS 3.2 01/31/2018   HGB 11.4 (L) 01/17/2018   HCT 34.4 (L) 01/31/2018   MCV 86.6 01/31/2018   PLT 56 (L) 01/31/2018      Chemistry      Component Value Date/Time   NA 139 01/31/2018 0811   NA 141 08/23/2017 1026   K 3.8 01/31/2018 0811   K 3.7 08/23/2017 1026   CL 99 01/31/2018 0811   CL 98 02/02/2013 1353   CO2 31 (H) 01/31/2018 0811   CO2 33 (H) 08/23/2017 1026   BUN 18 01/31/2018 0811   BUN 21.0 08/23/2017 1026   CREATININE 0.92 01/31/2018 0811   CREATININE 1.2 (H) 08/23/2017 1026      Component Value Date/Time   CALCIUM 9.2 01/31/2018 0811   CALCIUM 9.3 08/23/2017 1026   ALKPHOS 56 01/31/2018 0811   ALKPHOS 66 08/23/2017 1026   AST 10 01/31/2018 0811   AST 11 08/23/2017 1026   ALT 20 01/31/2018 0811   ALT 10 08/23/2017 1026   BILITOT 0.7 01/31/2018 0811   BILITOT 0.59 08/23/2017 1026       RADIOGRAPHIC STUDIES: I have personally reviewed the radiological images as listed and agreed with the findings in the report. Ir US Guide Vasc Access Right  Result Date: 01/17/2018 INDICATION: History of CLL, in need of durable intravenous access for chemotherapy administration. EXAM: IMPLANTED PORT A CATH PLACEMENT WITH ULTRASOUND AND FLUOROSCOPIC GUIDANCE COMPARISON:  None. MEDICATIONS: Ancef 2 gm IV; The antibiotic was administered within an appropriate time interval prior to skin puncture. ANESTHESIA/SEDATION: Moderate (conscious) sedation was employed during this procedure. A total of Versed 2 mg and Fentanyl 100 mcg was administered intravenously. Moderate Sedation Time: 28 minutes. The patient's level of consciousness and vital signs were monitored continuously by radiology nursing throughout the procedure under my direct  supervision. CONTRAST:  None FLUOROSCOPY TIME:  18 seconds (12 mGy) COMPLICATIONS: None immediate. PROCEDURE: The procedure, risks, benefits, and alternatives were explained to the patient. Questions regarding the procedure were encouraged and answered. The patient understands and consents to the procedure. The right neck and  chest were prepped with chlorhexidine in a sterile fashion, and a sterile drape was applied covering the operative field. Maximum barrier sterile technique with sterile gowns and gloves were used for the procedure. A timeout was performed prior to the initiation of the procedure. Local anesthesia was provided with 1% lidocaine with epinephrine. After creating a small venotomy incision, a micropuncture kit was utilized to access the internal jugular vein. Real-time ultrasound guidance was utilized for vascular access including the acquisition of a permanent ultrasound image documenting patency of the accessed vessel. The microwire was utilized to measure appropriate catheter length. A subcutaneous port pocket was then created along the upper chest wall utilizing a combination of sharp and blunt dissection. The pocket was irrigated with sterile saline. A single lumen power injectable port was chosen for placement. The 8 Fr catheter was tunneled from the port pocket site to the venotomy incision. The port was placed in the pocket. The external catheter was trimmed to appropriate length. At the venotomy, an 8 Fr peel-away sheath was placed over a guidewire under fluoroscopic guidance. The catheter was then placed through the sheath and the sheath was removed. Final catheter positioning was confirmed and documented with a fluoroscopic spot radiograph. The port was accessed with a Huber needle, aspirated and flushed with heparinized saline. The venotomy site was closed with an interrupted 4-0 Vicryl suture. The port pocket incision was closed with interrupted 2-0 Vicryl suture and the skin was  opposed with a running subcuticular 4-0 Vicryl suture. Dermabond and Steri-strips were applied to both incisions. Dressings were placed. The patient tolerated the procedure well without immediate post procedural complication. FINDINGS: After catheter placement, the tip lies within the superior cavoatrial junction. The catheter aspirates and flushes normally and is ready for immediate use. IMPRESSION: Successful placement of a right internal jugular approach power injectable Port-A-Cath. The catheter is ready for immediate use. Electronically Signed   By: Sandi Mariscal M.D.   On: 01/17/2018 10:53   Ir Fluoro Guide Port Insertion Right  Result Date: 01/17/2018 INDICATION: History of CLL, in need of durable intravenous access for chemotherapy administration. EXAM: IMPLANTED PORT A CATH PLACEMENT WITH ULTRASOUND AND FLUOROSCOPIC GUIDANCE COMPARISON:  None. MEDICATIONS: Ancef 2 gm IV; The antibiotic was administered within an appropriate time interval prior to skin puncture. ANESTHESIA/SEDATION: Moderate (conscious) sedation was employed during this procedure. A total of Versed 2 mg and Fentanyl 100 mcg was administered intravenously. Moderate Sedation Time: 28 minutes. The patient's level of consciousness and vital signs were monitored continuously by radiology nursing throughout the procedure under my direct supervision. CONTRAST:  None FLUOROSCOPY TIME:  18 seconds (12 mGy) COMPLICATIONS: None immediate. PROCEDURE: The procedure, risks, benefits, and alternatives were explained to the patient. Questions regarding the procedure were encouraged and answered. The patient understands and consents to the procedure. The right neck and chest were prepped with chlorhexidine in a sterile fashion, and a sterile drape was applied covering the operative field. Maximum barrier sterile technique with sterile gowns and gloves were used for the procedure. A timeout was performed prior to the initiation of the procedure. Local  anesthesia was provided with 1% lidocaine with epinephrine. After creating a small venotomy incision, a micropuncture kit was utilized to access the internal jugular vein. Real-time ultrasound guidance was utilized for vascular access including the acquisition of a permanent ultrasound image documenting patency of the accessed vessel. The microwire was utilized to measure appropriate catheter length. A subcutaneous port pocket was then created along the upper chest  wall utilizing a combination of sharp and blunt dissection. The pocket was irrigated with sterile saline. A single lumen power injectable port was chosen for placement. The 8 Fr catheter was tunneled from the port pocket site to the venotomy incision. The port was placed in the pocket. The external catheter was trimmed to appropriate length. At the venotomy, an 8 Fr peel-away sheath was placed over a guidewire under fluoroscopic guidance. The catheter was then placed through the sheath and the sheath was removed. Final catheter positioning was confirmed and documented with a fluoroscopic spot radiograph. The port was accessed with a Huber needle, aspirated and flushed with heparinized saline. The venotomy site was closed with an interrupted 4-0 Vicryl suture. The port pocket incision was closed with interrupted 2-0 Vicryl suture and the skin was opposed with a running subcuticular 4-0 Vicryl suture. Dermabond and Steri-strips were applied to both incisions. Dressings were placed. The patient tolerated the procedure well without immediate post procedural complication. FINDINGS: After catheter placement, the tip lies within the superior cavoatrial junction. The catheter aspirates and flushes normally and is ready for immediate use. IMPRESSION: Successful placement of a right internal jugular approach power injectable Port-A-Cath. The catheter is ready for immediate use. Electronically Signed   By: Sandi Mariscal M.D.   On: 01/17/2018 10:53    All questions  were answered. The patient knows to call the clinic with any problems, questions or concerns. No barriers to learning was detected.  I spent 15 minutes counseling the patient face to face. The total time spent in the appointment was 20 minutes and more than 50% was on counseling and review of test results  Heath Lark, MD 01/31/2018 10:39 AM

## 2018-01-31 NOTE — Assessment & Plan Note (Signed)
She has significant elevated blood pressure, could be due to anxiety She will continue current prescription antihypertensives

## 2018-01-31 NOTE — Telephone Encounter (Signed)
Gave patient AVs and calendar of upcoming may appointments.  °

## 2018-02-07 ENCOUNTER — Inpatient Hospital Stay: Payer: Medicare Other

## 2018-02-07 VITALS — BP 133/57 | HR 72 | Temp 98.5°F | Resp 17

## 2018-02-07 DIAGNOSIS — Z5111 Encounter for antineoplastic chemotherapy: Secondary | ICD-10-CM | POA: Diagnosis not present

## 2018-02-07 DIAGNOSIS — C9110 Chronic lymphocytic leukemia of B-cell type not having achieved remission: Secondary | ICD-10-CM | POA: Diagnosis not present

## 2018-02-07 DIAGNOSIS — Z5112 Encounter for antineoplastic immunotherapy: Secondary | ICD-10-CM | POA: Diagnosis not present

## 2018-02-07 LAB — CBC WITH DIFFERENTIAL (CANCER CENTER ONLY)
Basophils Absolute: 0.1 10*3/uL (ref 0.0–0.1)
Basophils Relative: 2 %
Eosinophils Absolute: 0.3 10*3/uL (ref 0.0–0.5)
Eosinophils Relative: 6 %
HCT: 34.3 % — ABNORMAL LOW (ref 34.8–46.6)
Hemoglobin: 11 g/dL — ABNORMAL LOW (ref 11.6–15.9)
Lymphocytes Relative: 8 %
Lymphs Abs: 0.4 10*3/uL — ABNORMAL LOW (ref 0.9–3.3)
MCH: 26.7 pg (ref 25.1–34.0)
MCHC: 32 g/dL (ref 31.5–36.0)
MCV: 83.5 fL (ref 79.5–101.0)
Monocytes Absolute: 0.3 10*3/uL (ref 0.1–0.9)
Monocytes Relative: 7 %
Neutro Abs: 3.4 10*3/uL (ref 1.5–6.5)
Neutrophils Relative %: 77 %
Platelet Count: 41 10*3/uL — ABNORMAL LOW (ref 145–400)
RBC: 4.11 MIL/uL (ref 3.70–5.45)
RDW: 18.3 % — ABNORMAL HIGH (ref 11.2–14.5)
WBC Count: 4.4 10*3/uL (ref 3.9–10.3)

## 2018-02-07 LAB — CMP (CANCER CENTER ONLY)
ALT: 18 U/L (ref 0–55)
AST: 11 U/L (ref 5–34)
Albumin: 3.6 g/dL (ref 3.5–5.0)
Alkaline Phosphatase: 57 U/L (ref 40–150)
Anion gap: 9 (ref 3–11)
BUN: 26 mg/dL (ref 7–26)
CO2: 27 mmol/L (ref 22–29)
Calcium: 9.4 mg/dL (ref 8.4–10.4)
Chloride: 97 mmol/L — ABNORMAL LOW (ref 98–109)
Creatinine: 1.1 mg/dL (ref 0.60–1.10)
GFR, Est AFR Am: 57 mL/min — ABNORMAL LOW
GFR, Estimated: 49 mL/min — ABNORMAL LOW
Glucose, Bld: 126 mg/dL (ref 70–140)
Potassium: 3.3 mmol/L — ABNORMAL LOW (ref 3.5–5.1)
Sodium: 133 mmol/L — ABNORMAL LOW (ref 136–145)
Total Bilirubin: 0.6 mg/dL (ref 0.2–1.2)
Total Protein: 5.7 g/dL — ABNORMAL LOW (ref 6.4–8.3)

## 2018-02-07 LAB — URIC ACID: Uric Acid, Serum: 6.1 mg/dL (ref 2.6–7.4)

## 2018-02-07 MED ORDER — SODIUM CHLORIDE 0.9 % IV SOLN
1000.0000 mg | Freq: Once | INTRAVENOUS | Status: AC
  Administered 2018-02-07: 1000 mg via INTRAVENOUS
  Filled 2018-02-07: qty 40

## 2018-02-07 MED ORDER — SODIUM CHLORIDE 0.9 % IV SOLN: 10.0000 mg | Freq: Once | INTRAVENOUS | Status: DC

## 2018-02-07 MED ORDER — SODIUM CHLORIDE 0.9% FLUSH
10.0000 mL | INTRAVENOUS | Status: DC | PRN
  Administered 2018-02-07: 10 mL
  Filled 2018-02-07: qty 10

## 2018-02-07 MED ORDER — ACETAMINOPHEN 325 MG PO TABS
650.0000 mg | ORAL_TABLET | Freq: Once | ORAL | Status: AC
  Administered 2018-02-07: 650 mg via ORAL

## 2018-02-07 MED ORDER — ACETAMINOPHEN 325 MG PO TABS
ORAL_TABLET | ORAL | Status: AC
Start: 2018-02-07 — End: ?
  Filled 2018-02-07: qty 2

## 2018-02-07 MED ORDER — DEXAMETHASONE SODIUM PHOSPHATE 10 MG/ML IJ SOLN
INTRAMUSCULAR | Status: AC
  Filled 2018-02-07: qty 1

## 2018-02-07 MED ORDER — DIPHENHYDRAMINE HCL 50 MG/ML IJ SOLN
50.0000 mg | Freq: Once | INTRAMUSCULAR | Status: AC
  Administered 2018-02-07: 50 mg via INTRAVENOUS

## 2018-02-07 MED ORDER — HEPARIN SOD (PORK) LOCK FLUSH 100 UNIT/ML IV SOLN
500.0000 [IU] | Freq: Once | INTRAVENOUS | Status: AC | PRN
  Administered 2018-02-07: 500 [IU]
  Filled 2018-02-07: qty 5

## 2018-02-07 MED ORDER — DEXAMETHASONE SODIUM PHOSPHATE 10 MG/ML IJ SOLN
10.0000 mg | Freq: Once | INTRAMUSCULAR | Status: AC
  Administered 2018-02-07: 10 mg via INTRAVENOUS

## 2018-02-07 MED ORDER — DIPHENHYDRAMINE HCL 50 MG/ML IJ SOLN
INTRAMUSCULAR | Status: AC
  Filled 2018-02-07: qty 1

## 2018-02-07 MED ORDER — SODIUM CHLORIDE 0.9 % IV SOLN
Freq: Once | INTRAVENOUS | Status: AC
  Administered 2018-02-07: 10:00:00 via INTRAVENOUS

## 2018-02-07 NOTE — Progress Notes (Signed)
Potassium of 3.3 today pt educated on increasing potassium in diet and provided materials in d/c paperwork. Pt and niece verbalized understanding.

## 2018-02-07 NOTE — Patient Instructions (Addendum)
[facility] Discharge Instructions for Patients Receiving Chemotherapy  Today you received the following chemotherapy agents:  Gazyva  To help prevent nausea and vomiting after your treatment, we encourage you to take your nausea medication as directed.   If you develop nausea and vomiting that is not controlled by your nausea medication, call the clinic.   BELOW ARE SYMPTOMS THAT SHOULD BE REPORTED IMMEDIATELY:  *FEVER GREATER THAN 100.5 F  *CHILLS WITH OR WITHOUT FEVER  NAUSEA AND VOMITING THAT IS NOT CONTROLLED WITH YOUR NAUSEA MEDICATION  *UNUSUAL SHORTNESS OF BREATH  *UNUSUAL BRUISING OR BLEEDING  TENDERNESS IN MOUTH AND THROAT WITH OR WITHOUT PRESENCE OF ULCERS  *URINARY PROBLEMS  *BOWEL PROBLEMS  UNUSUAL RASH Items with * indicate a potential emergency and should be followed up as soon as possible.  Feel free to call the clinic should you have any questions or concerns. The clinic phone number is [phone].  Please show the [facility] at check-in to the Emergency Department and triage nurse.    Thrombocytopenia Thrombocytopenia means that you have a low number of platelets in your blood. Platelets are tiny cells in the blood. When you bleed, they clump together at the cut or injury to stop the bleeding. This is called blood clotting. Not having enough platelets can cause bleeding problems. Follow these instructions at home: General instructions  Check your skin and inside your mouth for bruises or blood as told by your doctor.  Check to see if there is blood in your spit (sputum), pee (urine), and poop (stool). Do this as told by your doctor.  Ask your doctor if you can drink alcohol.  Take over-the-counter and prescription medicines only as told by your doctor.  Tell all of your doctors that you have this condition. Be sure to tell your dentist and eye doctor too. Activity  Do not do activities that can cause bumps or bruises until  your doctor says it is okay.  Be careful not to cut yourself: ? When you shave. ? When you use scissors, needles, knives, or other tools.  Be careful not to burn yourself: ? When you use an iron. ? When you cook. Contact a doctor if:  You have bruises and you do not know why. Get help right away if:  You are bleeding anywhere on your body.  You have blood in your spit, pee, or poop. This information is not intended to replace advice given to you by your health care provider. Make sure you discuss any questions you have with your health care provider. Document Released: 09/23/2011 Document Revised: 06/06/2016 Document Reviewed: 04/07/2015 Elsevier Interactive Patient Education  2018 [employer].   Hypokalemia Hypokalemia means that the amount of potassium in the blood is lower than normal.Potassium is a chemical that helps regulate the amount of fluid in the body (electrolyte). It also stimulates muscle tightening (contraction) and helps nerves work properly.Normally, most of the body's potassium is inside of cells, and only a very small amount is in the blood. Because the amount in the blood is so small, minor changes to potassium levels in the blood can be life-threatening. What are the causes? This condition may be caused by:  Antibiotic medicine.  Diarrhea or vomiting. Taking too much of a medicine that helps you have a bowel movement (laxative) can cause diarrhea and lead to hypokalemia.  Chronic kidney disease (CKD).  Medicines that help the body get rid of excess fluid (diuretics).  Eating disorders, such  as bulimia.  Low magnesium levels in the body.  Sweating a lot.  What are the signs or symptoms? Symptoms of this condition include:  Weakness.  Constipation.  Fatigue.  Muscle cramps.  Mental confusion.  Skipped heartbeats or irregular heartbeat (palpitations).  Tingling or numbness.  How is this diagnosed? This condition is diagnosed with a  blood test. How is this treated? Hypokalemia can be treated by taking potassium supplements by mouth or adjusting the medicines that you take. Treatment may also include eating more foods that contain a lot of potassium. If your potassium level is very low, you may need to get potassium through an IV tube in one of your veins and be monitored in the hospital. Follow these instructions at home:  Take over-the-counter and prescription medicines only as told by your health care provider. This includes vitamins and supplements.  Eat a healthy diet. A healthy diet includes fresh fruits and vegetables, whole grains, healthy fats, and lean proteins.  If instructed, eat more foods that contain a lot of potassium, such as: ? Nuts, such as peanuts and pistachios. ? Seeds, such as sunflower seeds and pumpkin seeds. ? Peas, lentils, and lima beans. ? Whole grain and bran cereals and breads. ? Fresh fruits and vegetables, such as apricots, avocado, bananas, cantaloupe, kiwi, oranges, tomatoes, asparagus, and potatoes. ? Orange juice. ? Tomato juice. ? Red meats. ? Yogurt.  Keep all follow-up visits as told by your health care provider. This is important. Contact a health care provider if:  You have weakness that gets worse.  You feel your heart pounding or racing.  You vomit.  You have diarrhea.  You have diabetes (diabetes mellitus) and you have trouble keeping your blood sugar (glucose) in your target range. Get help right away if:  You have chest pain.  You have shortness of breath.  You have vomiting or diarrhea that lasts for more than 2 days.  You faint. This information is not intended to replace advice given to you by your health care provider. Make sure you discuss any questions you have with your health care provider. Document Released: 10/04/2005 Document Revised: 05/22/2016 Document Reviewed: 05/22/2016 Elsevier Interactive Patient Education  2018 [employer].

## 2018-02-13 DIAGNOSIS — I10 Essential (primary) hypertension: Secondary | ICD-10-CM | POA: Diagnosis not present

## 2018-02-13 DIAGNOSIS — R05 Cough: Secondary | ICD-10-CM | POA: Diagnosis not present

## 2018-02-13 DIAGNOSIS — R42 Dizziness and giddiness: Secondary | ICD-10-CM | POA: Diagnosis not present

## 2018-02-13 DIAGNOSIS — R2689 Other abnormalities of gait and mobility: Secondary | ICD-10-CM | POA: Diagnosis not present

## 2018-02-14 DIAGNOSIS — R42 Dizziness and giddiness: Secondary | ICD-10-CM | POA: Diagnosis not present

## 2018-02-21 ENCOUNTER — Encounter: Payer: Self-pay | Admitting: Hematology and Oncology

## 2018-02-21 ENCOUNTER — Inpatient Hospital Stay: Payer: Medicare Other | Attending: Hematology and Oncology

## 2018-02-21 ENCOUNTER — Inpatient Hospital Stay (HOSPITAL_BASED_OUTPATIENT_CLINIC_OR_DEPARTMENT_OTHER): Payer: Medicare Other | Admitting: Hematology and Oncology

## 2018-02-21 ENCOUNTER — Inpatient Hospital Stay: Payer: Medicare Other

## 2018-02-21 VITALS — BP 141/67 | HR 70 | Temp 97.6°F | Resp 18 | Ht 63.0 in | Wt 294.3 lb

## 2018-02-21 DIAGNOSIS — I10 Essential (primary) hypertension: Secondary | ICD-10-CM | POA: Diagnosis not present

## 2018-02-21 DIAGNOSIS — D61818 Other pancytopenia: Secondary | ICD-10-CM | POA: Diagnosis not present

## 2018-02-21 DIAGNOSIS — D696 Thrombocytopenia, unspecified: Secondary | ICD-10-CM | POA: Diagnosis not present

## 2018-02-21 DIAGNOSIS — Y92009 Unspecified place in unspecified non-institutional (private) residence as the place of occurrence of the external cause: Secondary | ICD-10-CM | POA: Diagnosis not present

## 2018-02-21 DIAGNOSIS — C9110 Chronic lymphocytic leukemia of B-cell type not having achieved remission: Secondary | ICD-10-CM

## 2018-02-21 DIAGNOSIS — W19XXXA Unspecified fall, initial encounter: Secondary | ICD-10-CM | POA: Diagnosis not present

## 2018-02-21 DIAGNOSIS — Z95828 Presence of other vascular implants and grafts: Secondary | ICD-10-CM

## 2018-02-21 LAB — CBC WITH DIFFERENTIAL (CANCER CENTER ONLY)
Basophils Absolute: 0.1 10*3/uL (ref 0.0–0.1)
Basophils Relative: 1 %
Eosinophils Absolute: 0.2 10*3/uL (ref 0.0–0.5)
Eosinophils Relative: 5 %
HCT: 33 % — ABNORMAL LOW (ref 34.8–46.6)
Hemoglobin: 10.9 g/dL — ABNORMAL LOW (ref 11.6–15.9)
Lymphocytes Relative: 8 %
Lymphs Abs: 0.4 10*3/uL — ABNORMAL LOW (ref 0.9–3.3)
MCH: 27.6 pg (ref 25.1–34.0)
MCHC: 33.2 g/dL (ref 31.5–36.0)
MCV: 83.2 fL (ref 79.5–101.0)
Monocytes Absolute: 0.5 10*3/uL (ref 0.1–0.9)
Monocytes Relative: 9 %
Neutro Abs: 3.8 10*3/uL (ref 1.5–6.5)
Neutrophils Relative %: 77 %
Platelet Count: 29 10*3/uL — ABNORMAL LOW (ref 145–400)
RBC: 3.97 MIL/uL (ref 3.70–5.45)
RDW: 17 % — ABNORMAL HIGH (ref 11.2–14.5)
Smear Review: 1
WBC Count: 4.9 10*3/uL (ref 3.9–10.3)

## 2018-02-21 LAB — CMP (CANCER CENTER ONLY)
ALT: 26 U/L (ref 0–55)
AST: 15 U/L (ref 5–34)
Albumin: 3.6 g/dL (ref 3.5–5.0)
Alkaline Phosphatase: 65 U/L (ref 40–150)
Anion gap: 9 (ref 3–11)
BUN: 20 mg/dL (ref 7–26)
CO2: 32 mmol/L — ABNORMAL HIGH (ref 22–29)
Calcium: 9.7 mg/dL (ref 8.4–10.4)
Chloride: 102 mmol/L (ref 98–109)
Creatinine: 1.11 mg/dL — ABNORMAL HIGH (ref 0.60–1.10)
GFR, Est AFR Am: 56 mL/min — ABNORMAL LOW (ref >60)
GFR, Estimated: 48 mL/min — ABNORMAL LOW (ref >60)
Glucose, Bld: 141 mg/dL — ABNORMAL HIGH (ref 70–140)
Potassium: 3.7 mmol/L (ref 3.5–5.1)
Sodium: 143 mmol/L (ref 136–145)
Total Bilirubin: 0.4 mg/dL (ref 0.2–1.2)
Total Protein: 5.7 g/dL — ABNORMAL LOW (ref 6.4–8.3)

## 2018-02-21 LAB — URIC ACID: Uric Acid, Serum: 5.4 mg/dL (ref 2.6–7.4)

## 2018-02-21 MED ORDER — SODIUM CHLORIDE 0.9% FLUSH
10.0000 mL | INTRAVENOUS | Status: DC | PRN
  Administered 2018-02-21: 10 mL via INTRAVENOUS
  Filled 2018-02-21: qty 10

## 2018-02-21 MED ORDER — SODIUM CHLORIDE 0.9% FLUSH
10.0000 mL | Freq: Once | INTRAVENOUS | Status: AC
  Administered 2018-02-21: 10 mL
  Filled 2018-02-21: qty 10

## 2018-02-21 MED ORDER — HEPARIN SOD (PORK) LOCK FLUSH 100 UNIT/ML IV SOLN
500.0000 [IU] | Freq: Once | INTRAVENOUS | Status: AC
  Administered 2018-02-21: 500 [IU] via INTRAVENOUS
  Filled 2018-02-21: qty 5

## 2018-02-21 NOTE — Assessment & Plan Note (Signed)
She had some recent fall at home It was precipitated by syncopal episode Her primary doctor has adjusted her blood pressure medication I recommend PT consult and referral for assessment and therapy as needed

## 2018-02-21 NOTE — Progress Notes (Signed)
[facility] OFFICE PROGRESS NOTE  Patient Care Team: Jonathon Jordan, MD as PCP - General (Family Medicine)  ASSESSMENT & PLAN:  CLL (chronic lymphocytic leukemia) ([place]) She has achieved hematological response Unfortunately, she is profoundly pancytopenic I will cancel her treatment today and rescheduled to 2 weeks I plan to repeat imaging after 3 months of treatment  Pancytopenia, acquired ([place]) She have severe, profound pancytopenia due to recent treatment She had some bruises but no bleeding She is educated to watch out for signs and symptoms of bleeding She does not need transfusion We will observe only and reschedule her treatment for 2 weeks  Fall at home, initial encounter She had some recent fall at home It was precipitated by syncopal episode Her primary doctor has adjusted her blood pressure medication I recommend PT consult and referral for assessment and therapy as needed   No orders of the defined types were placed in this encounter.   INTERVAL HISTORY: Please see below for problem oriented charting. She returns for further follow-up She had recent new bruises The patient denies any recent signs or symptoms of bleeding such as spontaneous epistaxis, hematuria or hematochezia. She had recent syncopal episode and had a fall but no major injuries Her primary care doctor had adjusted her blood pressure medications She denies recent infection, fever or chills She has more energy overall  SUMMARY OF ONCOLOGIC HISTORY: Oncology History   FISH del 13 q     CLL (chronic lymphocytic leukemia) ([place])   07/02/2010 Procedure    LN biopsy confirmed CLL      08/18/2010 Procedure    Bone marrow biopsy confirmed CLL      11/18/2010 - 04/15/2011 Chemotherapy    patient received 6 cycles of FLudarabine and Rituximab      01/13/2012 Relapse/Recurrence    Repeat BM biopsy confirmed relapse      01/27/2012 - 06/23/2012 Chemotherapy    Bendamustine & Rituximab  given, complicated by infusion reaction to Rituximab, completed 6 cycles      06/29/2013 Imaging    Ct scan confirmed disease relapse with bulky lymphadenopathy      07/23/2013 Procedure    Repeat BM biopsy due to thrombocytopenia and progressive leukocytosis      08/02/2013 Relapse/Recurrence    Patient consented to start iburitinib as salvage therapy for relapsed CLL      11/09/2013 Imaging    Repeat CT scan the chest, abdomen and pelvis showed greater than 50% response to treatment      06/17/2014 Imaging    Repeat CT scan showed near complete response to treatment.      05/30/2015 Imaging    Repeat CT scan showed near complete response to treatment.      06/14/2016 Imaging    CT scan showed stable scattered small retroperitoneal lymph nodes and pelvic lymph nodes. No findings for recurrent or progressive lymphoma. Stable mild splenomegaly.      12/30/2017 Imaging    1. Recurrent adenopathy within the chest, abdomen, and pelvis. 2. Splenomegaly. 3. Aortic Atherosclerosis (ICD10-I70.0). 4. Coronary artery calcifications.      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 The catheter is ready for immediate use.      01/24/2018 -  Chemotherapy    The patient had Gazyva and 1 dose of bendamustine. Bendamustine was  discontinued due to inability to get insurance approval       REVIEW OF SYSTEMS:   Constitutional: Denies fevers, chills or abnormal weight loss Eyes: Denies blurriness of vision Ears, nose, mouth, throat, and face: Denies mucositis or sore throat Respiratory: Denies cough, dyspnea or wheezes Cardiovascular: Denies palpitation, chest discomfort or lower extremity swelling Gastrointestinal:  Denies nausea, heartburn or change in bowel habits Skin: Denies abnormal skin rashes Lymphatics: Denies new  lymphadenopathy  Behavioral/Psych: Mood is stable, no new changes  All other systems were reviewed with the patient and are negative.  I have reviewed the past medical history, past surgical history, social history and family history with the patient and they are unchanged from previous note.  ALLERGIES:  is allergic to azithromycin and ibuprofen [ibuprofen].  MEDICATIONS:  Current Outpatient Medications  Medication Sig Dispense Refill  . acyclovir (ZOVIRAX) 400 MG tablet Take 1 tablet (400 mg total) by mouth daily. 30 tablet 5  . allopurinol (ZYLOPRIM) 300 MG tablet Take 1 tablet (300 mg total) by mouth daily. 30 tablet 2  . amLODipine (NORVASC) 10 MG tablet TAKE 1 TABLET (10 MG TOTAL) BY MOUTH DAILY. 90 tablet 1  . bisoprolol (ZEBETA) 5 MG tablet   10  . Carboxymethylcellul-Glycerin (REFRESH OPTIVE SENSITIVE OP) Place 1 drop into both eyes as needed.     . citalopram (CELEXA) 20 MG tablet Take 20 mg by mouth every morning.     . lidocaine-prilocaine (EMLA) cream Apply to affected area once 30 g 3  . loratadine (CLARITIN) 10 MG tablet Take 10 mg daily by mouth.    . losartan-hydrochlorothiazide (HYZAAR) 100-25 MG tablet   9  . Multiple Vitamins-Minerals (PRESERVISION AREDS 2) CAPS Take 1 capsule by mouth 2 (two) times daily.    . ondansetron (ZOFRAN) 8 MG tablet Take 1 tablet (8 mg total) by mouth every 8 (eight) hours as needed. Then take as needed for nausea or vomiting. 30 tablet 1  . prochlorperazine (COMPAZINE) 10 MG tablet Take 1 tablet (10 mg total) by mouth every 6 (six) hours as needed (Nausea or vomiting). 30 tablet 1  . pseudoephedrine-guaifenesin (MUCINEX D) 60-600 MG 12 hr tablet Take 1 tablet by mouth every 12 (twelve) hours.    Marland Kitchen tiZANidine (ZANAFLEX) 2 MG tablet Take by mouth every 6 (six) hours as needed for muscle spasms.    . Vitamin D, Ergocalciferol, 2000 units CAPS Take by mouth.     Current Facility-Administered Medications  Medication Dose Route Frequency Provider  Last Rate Last Dose  . sodium chloride flush (NS) 0.9 % injection 10 mL  10 mL Intravenous PRN Alvy Bimler, Shankar Silber, MD   10 mL at 02/21/18 0904    PHYSICAL EXAMINATION: ECOG PERFORMANCE STATUS: 1 - Symptomatic but completely ambulatory  Vitals:   02/21/18 0848  BP: (!) 141/67  Pulse: 70  Resp: 18  Temp: 97.6 F (36.4 C)  SpO2: 96%   Filed Weights   02/21/18 0848  Weight: 294 lb 4.8 oz (133.5 kg)    GENERAL:alert, no distress and comfortable SKIN: Noted skin bruises. EYES: normal, Conjunctiva are pink and non-injected, sclera clear OROPHARYNX:no exudate, no erythema and lips, buccal mucosa, and tongue normal  NECK: supple, thyroid normal size, non-tender, without nodularity LYMPH:  no palpable lymphadenopathy in the cervical, axillary or inguinal LUNGS: clear to auscultation and percussion with normal breathing effort HEART: regular rate & rhythm and no murmurs and no lower extremity edema ABDOMEN:abdomen soft, non-tender and normal bowel sounds Musculoskeletal:no cyanosis of  digits and no clubbing  NEURO: alert & oriented x 3 with fluent speech, no focal motor/sensory deficits  LABORATORY DATA:  I have reviewed the data as listed    Component Value Date/Time   NA 143 02/21/2018 0809   NA 141 08/23/2017 1026   K 3.7 02/21/2018 0809   K 3.7 08/23/2017 1026   CL 102 02/21/2018 0809   CL 98 02/02/2013 1353   CO2 32 (H) 02/21/2018 0809   CO2 33 (H) 08/23/2017 1026   GLUCOSE 141 (H) 02/21/2018 0809   GLUCOSE 92 08/23/2017 1026   GLUCOSE 99 02/02/2013 1353   BUN 20 02/21/2018 0809   BUN 21.0 08/23/2017 1026   CREATININE 1.11 (H) 02/21/2018 0809   CREATININE 1.2 (H) 08/23/2017 1026   CALCIUM 9.7 02/21/2018 0809   CALCIUM 9.3 08/23/2017 1026   PROT 5.7 (L) 02/21/2018 0809   PROT 6.3 (L) 08/23/2017 1026   ALBUMIN 3.6 02/21/2018 0809   ALBUMIN 3.8 08/23/2017 1026   AST 15 02/21/2018 0809   AST 11 08/23/2017 1026   ALT 26 02/21/2018 0809   ALT 10 08/23/2017 1026   ALKPHOS 65  02/21/2018 0809   ALKPHOS 66 08/23/2017 1026   BILITOT 0.4 02/21/2018 0809   BILITOT 0.59 08/23/2017 1026   GFRNONAA 48 (L) 02/21/2018 0809   GFRAA 56 (L) 02/21/2018 0809    No results found for: SPEP, UPEP  Lab Results  Component Value Date   WBC 4.9 02/21/2018   NEUTROABS 3.8 02/21/2018   HGB 10.9 (L) 02/21/2018   HCT 33.0 (L) 02/21/2018   MCV 83.2 02/21/2018   PLT 29 (L) 02/21/2018      Chemistry      Component Value Date/Time   NA 143 02/21/2018 0809   NA 141 08/23/2017 1026   K 3.7 02/21/2018 0809   K 3.7 08/23/2017 1026   CL 102 02/21/2018 0809   CL 98 02/02/2013 1353   CO2 32 (H) 02/21/2018 0809   CO2 33 (H) 08/23/2017 1026   BUN 20 02/21/2018 0809   BUN 21.0 08/23/2017 1026   CREATININE 1.11 (H) 02/21/2018 0809   CREATININE 1.2 (H) 08/23/2017 1026      Component Value Date/Time   CALCIUM 9.7 02/21/2018 0809   CALCIUM 9.3 08/23/2017 1026   ALKPHOS 65 02/21/2018 0809   ALKPHOS 66 08/23/2017 1026   AST 15 02/21/2018 0809   AST 11 08/23/2017 1026   ALT 26 02/21/2018 0809   ALT 10 08/23/2017 1026   BILITOT 0.4 02/21/2018 0809   BILITOT 0.59 08/23/2017 1026      All questions were answered. The patient knows to call the clinic with any problems, questions or concerns. No barriers to learning was detected.  I spent 15 minutes counseling the patient face to face. The total time spent in the appointment was 20 minutes and more than 50% was on counseling and review of test results  Heath Lark, MD 02/21/2018 11:15 AM

## 2018-02-21 NOTE — Assessment & Plan Note (Signed)
She has achieved hematological response Unfortunately, she is profoundly pancytopenic I will cancel her treatment today and rescheduled to 2 weeks I plan to repeat imaging after 3 months of treatment

## 2018-02-21 NOTE — Assessment & Plan Note (Signed)
She have severe, profound pancytopenia due to recent treatment She had some bruises but no bleeding She is educated to watch out for signs and symptoms of bleeding She does not need transfusion We will observe only and reschedule her treatment for 2 weeks

## 2018-02-21 NOTE — Patient Instructions (Signed)

## 2018-02-22 ENCOUNTER — Telehealth: Payer: Self-pay | Admitting: Hematology and Oncology

## 2018-02-22 NOTE — Telephone Encounter (Signed)
Spoke to patients daughter regarding upcoming may appointments.

## 2018-02-23 LAB — FISH,CLL PROGNOSTIC PANEL

## 2018-02-28 DIAGNOSIS — R2689 Other abnormalities of gait and mobility: Secondary | ICD-10-CM | POA: Diagnosis not present

## 2018-02-28 DIAGNOSIS — R296 Repeated falls: Secondary | ICD-10-CM | POA: Diagnosis not present

## 2018-02-28 DIAGNOSIS — R2681 Unsteadiness on feet: Secondary | ICD-10-CM | POA: Diagnosis not present

## 2018-02-28 DIAGNOSIS — M6281 Muscle weakness (generalized): Secondary | ICD-10-CM | POA: Diagnosis not present

## 2018-03-01 DIAGNOSIS — M6281 Muscle weakness (generalized): Secondary | ICD-10-CM | POA: Diagnosis not present

## 2018-03-01 DIAGNOSIS — R2681 Unsteadiness on feet: Secondary | ICD-10-CM | POA: Diagnosis not present

## 2018-03-01 DIAGNOSIS — R2689 Other abnormalities of gait and mobility: Secondary | ICD-10-CM | POA: Diagnosis not present

## 2018-03-01 DIAGNOSIS — R296 Repeated falls: Secondary | ICD-10-CM | POA: Diagnosis not present

## 2018-03-03 DIAGNOSIS — M6281 Muscle weakness (generalized): Secondary | ICD-10-CM | POA: Diagnosis not present

## 2018-03-03 DIAGNOSIS — R2689 Other abnormalities of gait and mobility: Secondary | ICD-10-CM | POA: Diagnosis not present

## 2018-03-03 DIAGNOSIS — R2681 Unsteadiness on feet: Secondary | ICD-10-CM | POA: Diagnosis not present

## 2018-03-03 DIAGNOSIS — R296 Repeated falls: Secondary | ICD-10-CM | POA: Diagnosis not present

## 2018-03-06 ENCOUNTER — Telehealth: Payer: Self-pay | Admitting: Hematology and Oncology

## 2018-03-06 ENCOUNTER — Inpatient Hospital Stay: Payer: Medicare Other

## 2018-03-06 ENCOUNTER — Inpatient Hospital Stay (HOSPITAL_BASED_OUTPATIENT_CLINIC_OR_DEPARTMENT_OTHER): Payer: Medicare Other | Admitting: Hematology and Oncology

## 2018-03-06 ENCOUNTER — Encounter: Payer: Self-pay | Admitting: Hematology and Oncology

## 2018-03-06 DIAGNOSIS — I10 Essential (primary) hypertension: Secondary | ICD-10-CM

## 2018-03-06 DIAGNOSIS — C9110 Chronic lymphocytic leukemia of B-cell type not having achieved remission: Secondary | ICD-10-CM

## 2018-03-06 DIAGNOSIS — D696 Thrombocytopenia, unspecified: Secondary | ICD-10-CM

## 2018-03-06 LAB — CBC WITH DIFFERENTIAL (CANCER CENTER ONLY)
Basophils Absolute: 0.1 10*3/uL (ref 0.0–0.1)
Basophils Relative: 1 %
Eosinophils Absolute: 0.4 10*3/uL (ref 0.0–0.5)
Eosinophils Relative: 5 %
HCT: 37 % (ref 34.8–46.6)
Hemoglobin: 12 g/dL (ref 11.6–15.9)
Lymphocytes Relative: 7 %
Lymphs Abs: 0.5 10*3/uL — ABNORMAL LOW (ref 0.9–3.3)
MCH: 27.6 pg (ref 25.1–34.0)
MCHC: 32.4 g/dL (ref 31.5–36.0)
MCV: 85.1 fL (ref 79.5–101.0)
Monocytes Absolute: 0.8 10*3/uL (ref 0.1–0.9)
Monocytes Relative: 11 %
Neutro Abs: 5.4 10*3/uL (ref 1.5–6.5)
Neutrophils Relative %: 76 %
Platelet Count: 49 10*3/uL — ABNORMAL LOW (ref 145–400)
RBC: 4.35 MIL/uL (ref 3.70–5.45)
RDW: 15 % — ABNORMAL HIGH (ref 11.2–14.5)
Smear Review: 2
WBC Count: 7.1 10*3/uL (ref 3.9–10.3)

## 2018-03-06 LAB — CMP (CANCER CENTER ONLY)
ALT: 26 U/L (ref 0–55)
AST: 23 U/L (ref 5–34)
Albumin: 4 g/dL (ref 3.5–5.0)
Alkaline Phosphatase: 69 U/L (ref 40–150)
Anion gap: 13 — ABNORMAL HIGH (ref 3–11)
BUN: 19 mg/dL (ref 7–26)
CO2: 27 mmol/L (ref 22–29)
Calcium: 9.8 mg/dL (ref 8.4–10.4)
Chloride: 100 mmol/L (ref 98–109)
Creatinine: 1.09 mg/dL (ref 0.60–1.10)
GFR, Est AFR Am: 57 mL/min — ABNORMAL LOW (ref >60)
GFR, Estimated: 49 mL/min — ABNORMAL LOW (ref >60)
Glucose, Bld: 106 mg/dL (ref 70–140)
Potassium: 3.9 mmol/L (ref 3.5–5.1)
Sodium: 140 mmol/L (ref 136–145)
Total Bilirubin: 0.5 mg/dL (ref 0.2–1.2)
Total Protein: 6.3 g/dL — ABNORMAL LOW (ref 6.4–8.3)

## 2018-03-06 LAB — URIC ACID: Uric Acid, Serum: 5.5 mg/dL (ref 2.6–7.4)

## 2018-03-06 NOTE — Assessment & Plan Note (Signed)
She has significant elevated blood pressure, could be due to anxiety She will continue current prescription antihypertensives

## 2018-03-06 NOTE — Telephone Encounter (Signed)
Gave patient AVS and calendar of upcoming June appointments.  °

## 2018-03-06 NOTE — Assessment & Plan Note (Signed)
This is likely due to recent treatment. The patient denies recent history of bleeding such as epistaxis, hematuria or hematochezia. She is asymptomatic from the low platelet count.  I plan to reschedule her treatment for 2 weeks from now.  She does not need transfusion support

## 2018-03-06 NOTE — Progress Notes (Signed)
Notified Infusion, per Dr.Gorsuch, pt will not have treatment today d/t to low platelet count.

## 2018-03-06 NOTE — Progress Notes (Signed)
[facility] OFFICE PROGRESS NOTE  Patient Care Team: Jonathon Jordan, MD as PCP - General (Family Medicine)  ASSESSMENT & PLAN:  CLL (chronic lymphocytic leukemia) ([place]) She has achieved hematological response Unfortunately, she is still thrombocytopenic I will cancel her treatment today and rescheduled to 2 weeks I plan to repeat imaging after 3 months of treatment  Thrombocytopenia, unspecified ([place]) This is likely due to recent treatment. The patient denies recent history of bleeding such as epistaxis, hematuria or hematochezia. She is asymptomatic from the low platelet count.  I plan to reschedule her treatment for 2 weeks from now.  She does not need transfusion support  Essential hypertension She has significant elevated blood pressure, could be due to anxiety She will continue current prescription antihypertensives   No orders of the defined types were placed in this encounter.   INTERVAL HISTORY: Please see below for problem oriented charting. She returns with caregiver for further follow-up She is doing well She denies bruising No recent spontaneous bleeding No recent fever or chills.  SUMMARY OF ONCOLOGIC HISTORY: Oncology History   FISH del 13 q     CLL (chronic lymphocytic leukemia) ([place])   07/02/2010 Procedure    LN biopsy confirmed CLL      08/18/2010 Procedure    Bone marrow biopsy confirmed CLL      11/18/2010 - 04/15/2011 Chemotherapy    patient received 6 cycles of FLudarabine and Rituximab      01/13/2012 Relapse/Recurrence    Repeat BM biopsy confirmed relapse      01/27/2012 - 06/23/2012 Chemotherapy    Bendamustine & Rituximab given, complicated by infusion reaction to Rituximab, completed 6 cycles      06/29/2013 Imaging    Ct scan confirmed disease relapse with bulky lymphadenopathy      07/23/2013 Procedure    Repeat BM biopsy due to thrombocytopenia and progressive leukocytosis      08/02/2013 Relapse/Recurrence   Patient consented to start iburitinib as salvage therapy for relapsed CLL      11/09/2013 Imaging    Repeat CT scan the chest, abdomen and pelvis showed greater than 50% response to treatment      06/17/2014 Imaging    Repeat CT scan showed near complete response to treatment.      05/30/2015 Imaging    Repeat CT scan showed near complete response to treatment.      06/14/2016 Imaging    CT scan showed stable scattered small retroperitoneal lymph nodes and pelvic lymph nodes. No findings for recurrent or progressive lymphoma. Stable mild splenomegaly.      12/30/2017 Imaging    1. Recurrent adenopathy within the chest, abdomen, and pelvis. 2. Splenomegaly. 3. Aortic Atherosclerosis (ICD10-I70.0). 4. Coronary artery calcifications.      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 The catheter is ready for immediate use.      01/24/2018 -  Chemotherapy    The patient had Gazyva and 1 dose of bendamustine. Bendamustine was discontinued due to inability to get insurance approval       REVIEW OF SYSTEMS:   Constitutional: Denies fevers, chills or abnormal weight loss Eyes: Denies blurriness of vision Ears, nose, mouth, throat, and face: Denies mucositis or sore throat Respiratory: Denies cough, dyspnea or wheezes Cardiovascular: Denies palpitation, chest discomfort or lower extremity swelling  Gastrointestinal:  Denies nausea, heartburn or change in bowel habits Skin: Denies abnormal skin rashes Lymphatics: Denies new lymphadenopathy or easy bruising Neurological:Denies numbness, tingling or new weaknesses Behavioral/Psych: Mood is stable, no new changes  All other systems were reviewed with the patient and are negative.  I have reviewed the past medical history, past surgical history, social history and family history  with the patient and they are unchanged from previous note.  ALLERGIES:  is allergic to azithromycin and ibuprofen [ibuprofen].  MEDICATIONS:  Current Outpatient Medications  Medication Sig Dispense Refill  . acyclovir (ZOVIRAX) 400 MG tablet Take 1 tablet (400 mg total) by mouth daily. 30 tablet 5  . allopurinol (ZYLOPRIM) 300 MG tablet Take 1 tablet (300 mg total) by mouth daily. 30 tablet 2  . amLODipine (NORVASC) 10 MG tablet TAKE 1 TABLET (10 MG TOTAL) BY MOUTH DAILY. 90 tablet 1  . bisoprolol (ZEBETA) 5 MG tablet   10  . Carboxymethylcellul-Glycerin (REFRESH OPTIVE SENSITIVE OP) Place 1 drop into both eyes as needed.     . citalopram (CELEXA) 20 MG tablet Take 20 mg by mouth every morning.     . lidocaine-prilocaine (EMLA) cream Apply to affected area once 30 g 3  . loratadine (CLARITIN) 10 MG tablet Take 10 mg daily by mouth.    . losartan-hydrochlorothiazide (HYZAAR) 100-25 MG tablet   9  . Multiple Vitamins-Minerals (PRESERVISION AREDS 2) CAPS Take 1 capsule by mouth 2 (two) times daily.    . ondansetron (ZOFRAN) 8 MG tablet Take 1 tablet (8 mg total) by mouth every 8 (eight) hours as needed. Then take as needed for nausea or vomiting. 30 tablet 1  . prochlorperazine (COMPAZINE) 10 MG tablet Take 1 tablet (10 mg total) by mouth every 6 (six) hours as needed (Nausea or vomiting). 30 tablet 1  . pseudoephedrine-guaifenesin (MUCINEX D) 60-600 MG 12 hr tablet Take 1 tablet by mouth every 12 (twelve) hours.    Marland Kitchen tiZANidine (ZANAFLEX) 2 MG tablet Take by mouth every 6 (six) hours as needed for muscle spasms.    . Vitamin D, Ergocalciferol, 2000 units CAPS Take by mouth.     No current facility-administered medications for this visit.     PHYSICAL EXAMINATION: ECOG PERFORMANCE STATUS: 1 - Symptomatic but completely ambulatory  Vitals:   03/06/18 0823  BP: (!) 181/75  Pulse: 75  Resp: 18  Temp: 98.1 F (36.7 C)  SpO2: 97%   Filed Weights   03/06/18 0823  Weight: 294 lb 1.6 oz  (133.4 kg)    GENERAL:alert, no distress and comfortable SKIN: skin color, texture, turgor are normal, no rashes or significant lesions EYES: normal, Conjunctiva are pink and non-injected, sclera clear OROPHARYNX:no exudate, no erythema and lips, buccal mucosa, and tongue normal  NECK: supple, thyroid normal size, non-tender, without nodularity LYMPH:  no palpable lymphadenopathy in the cervical, axillary or inguinal LUNGS: clear to auscultation and percussion with normal breathing effort HEART: regular rate & rhythm and no murmurs and no lower extremity edema ABDOMEN:abdomen soft, non-tender and normal bowel sounds Musculoskeletal:no cyanosis of digits and no clubbing  NEURO: alert & oriented x 3 with fluent speech, no focal motor/sensory deficits  LABORATORY DATA:  I have reviewed the data as listed    Component Value Date/Time   NA 140 03/06/2018 0810   NA 141 08/23/2017 1026   K 3.9 03/06/2018 0810   K 3.7 08/23/2017 1026   CL 100 03/06/2018 0810   CL 98 02/02/2013 1353  CO2 27 03/06/2018 0810   CO2 33 (H) 08/23/2017 1026   GLUCOSE 106 03/06/2018 0810   GLUCOSE 92 08/23/2017 1026   GLUCOSE 99 02/02/2013 1353   BUN 19 03/06/2018 0810   BUN 21.0 08/23/2017 1026   CREATININE 1.09 03/06/2018 0810   CREATININE 1.2 (H) 08/23/2017 1026   CALCIUM 9.8 03/06/2018 0810   CALCIUM 9.3 08/23/2017 1026   PROT 6.3 (L) 03/06/2018 0810   PROT 6.3 (L) 08/23/2017 1026   ALBUMIN 4.0 03/06/2018 0810   ALBUMIN 3.8 08/23/2017 1026   AST 23 03/06/2018 0810   AST 11 08/23/2017 1026   ALT 26 03/06/2018 0810   ALT 10 08/23/2017 1026   ALKPHOS 69 03/06/2018 0810   ALKPHOS 66 08/23/2017 1026   BILITOT 0.5 03/06/2018 0810   BILITOT 0.59 08/23/2017 1026   GFRNONAA 49 (L) 03/06/2018 0810   GFRAA 57 (L) 03/06/2018 0810    No results found for: SPEP, UPEP  Lab Results  Component Value Date   WBC 7.1 03/06/2018   NEUTROABS 5.4 03/06/2018   HGB 12.0 03/06/2018   HCT 37.0 03/06/2018   MCV  85.1 03/06/2018   PLT 49 (L) 03/06/2018      Chemistry      Component Value Date/Time   NA 140 03/06/2018 0810   NA 141 08/23/2017 1026   K 3.9 03/06/2018 0810   K 3.7 08/23/2017 1026   CL 100 03/06/2018 0810   CL 98 02/02/2013 1353   CO2 27 03/06/2018 0810   CO2 33 (H) 08/23/2017 1026   BUN 19 03/06/2018 0810   BUN 21.0 08/23/2017 1026   CREATININE 1.09 03/06/2018 0810   CREATININE 1.2 (H) 08/23/2017 1026      Component Value Date/Time   CALCIUM 9.8 03/06/2018 0810   CALCIUM 9.3 08/23/2017 1026   ALKPHOS 69 03/06/2018 0810   ALKPHOS 66 08/23/2017 1026   AST 23 03/06/2018 0810   AST 11 08/23/2017 1026   ALT 26 03/06/2018 0810   ALT 10 08/23/2017 1026   BILITOT 0.5 03/06/2018 0810   BILITOT 0.59 08/23/2017 1026      All questions were answered. The patient knows to call the clinic with any problems, questions or concerns. No barriers to learning was detected.  I spent 15 minutes counseling the patient face to face. The total time spent in the appointment was 20 minutes and more than 50% was on counseling and review of test results  Heath Lark, MD 03/06/2018 11:38 AM

## 2018-03-06 NOTE — Assessment & Plan Note (Signed)
She has achieved hematological response Unfortunately, she is still thrombocytopenic I will cancel her treatment today and rescheduled to 2 weeks I plan to repeat imaging after 3 months of treatment

## 2018-03-08 DIAGNOSIS — R2689 Other abnormalities of gait and mobility: Secondary | ICD-10-CM | POA: Diagnosis not present

## 2018-03-08 DIAGNOSIS — R296 Repeated falls: Secondary | ICD-10-CM | POA: Diagnosis not present

## 2018-03-08 DIAGNOSIS — R2681 Unsteadiness on feet: Secondary | ICD-10-CM | POA: Diagnosis not present

## 2018-03-08 DIAGNOSIS — M6281 Muscle weakness (generalized): Secondary | ICD-10-CM | POA: Diagnosis not present

## 2018-03-09 DIAGNOSIS — R2681 Unsteadiness on feet: Secondary | ICD-10-CM | POA: Diagnosis not present

## 2018-03-09 DIAGNOSIS — M6281 Muscle weakness (generalized): Secondary | ICD-10-CM | POA: Diagnosis not present

## 2018-03-09 DIAGNOSIS — R296 Repeated falls: Secondary | ICD-10-CM | POA: Diagnosis not present

## 2018-03-09 DIAGNOSIS — R2689 Other abnormalities of gait and mobility: Secondary | ICD-10-CM | POA: Diagnosis not present

## 2018-03-10 DIAGNOSIS — R2681 Unsteadiness on feet: Secondary | ICD-10-CM | POA: Diagnosis not present

## 2018-03-10 DIAGNOSIS — M6281 Muscle weakness (generalized): Secondary | ICD-10-CM | POA: Diagnosis not present

## 2018-03-10 DIAGNOSIS — R296 Repeated falls: Secondary | ICD-10-CM | POA: Diagnosis not present

## 2018-03-10 DIAGNOSIS — R2689 Other abnormalities of gait and mobility: Secondary | ICD-10-CM | POA: Diagnosis not present

## 2018-03-15 DIAGNOSIS — R2681 Unsteadiness on feet: Secondary | ICD-10-CM | POA: Diagnosis not present

## 2018-03-15 DIAGNOSIS — R296 Repeated falls: Secondary | ICD-10-CM | POA: Diagnosis not present

## 2018-03-15 DIAGNOSIS — M6281 Muscle weakness (generalized): Secondary | ICD-10-CM | POA: Diagnosis not present

## 2018-03-15 DIAGNOSIS — R2689 Other abnormalities of gait and mobility: Secondary | ICD-10-CM | POA: Diagnosis not present

## 2018-03-16 DIAGNOSIS — M6281 Muscle weakness (generalized): Secondary | ICD-10-CM | POA: Diagnosis not present

## 2018-03-16 DIAGNOSIS — R2689 Other abnormalities of gait and mobility: Secondary | ICD-10-CM | POA: Diagnosis not present

## 2018-03-16 DIAGNOSIS — R296 Repeated falls: Secondary | ICD-10-CM | POA: Diagnosis not present

## 2018-03-16 DIAGNOSIS — R2681 Unsteadiness on feet: Secondary | ICD-10-CM | POA: Diagnosis not present

## 2018-03-17 DIAGNOSIS — R2681 Unsteadiness on feet: Secondary | ICD-10-CM | POA: Diagnosis not present

## 2018-03-17 DIAGNOSIS — R2689 Other abnormalities of gait and mobility: Secondary | ICD-10-CM | POA: Diagnosis not present

## 2018-03-17 DIAGNOSIS — R296 Repeated falls: Secondary | ICD-10-CM | POA: Diagnosis not present

## 2018-03-17 DIAGNOSIS — M6281 Muscle weakness (generalized): Secondary | ICD-10-CM | POA: Diagnosis not present

## 2018-03-20 DIAGNOSIS — R296 Repeated falls: Secondary | ICD-10-CM | POA: Diagnosis not present

## 2018-03-20 DIAGNOSIS — R2681 Unsteadiness on feet: Secondary | ICD-10-CM | POA: Diagnosis not present

## 2018-03-20 DIAGNOSIS — R2689 Other abnormalities of gait and mobility: Secondary | ICD-10-CM | POA: Diagnosis not present

## 2018-03-20 DIAGNOSIS — M6281 Muscle weakness (generalized): Secondary | ICD-10-CM | POA: Diagnosis not present

## 2018-03-21 DIAGNOSIS — M6281 Muscle weakness (generalized): Secondary | ICD-10-CM | POA: Diagnosis not present

## 2018-03-21 DIAGNOSIS — R2681 Unsteadiness on feet: Secondary | ICD-10-CM | POA: Diagnosis not present

## 2018-03-21 DIAGNOSIS — R296 Repeated falls: Secondary | ICD-10-CM | POA: Diagnosis not present

## 2018-03-21 DIAGNOSIS — R2689 Other abnormalities of gait and mobility: Secondary | ICD-10-CM | POA: Diagnosis not present

## 2018-03-22 DIAGNOSIS — R2689 Other abnormalities of gait and mobility: Secondary | ICD-10-CM | POA: Diagnosis not present

## 2018-03-22 DIAGNOSIS — R2681 Unsteadiness on feet: Secondary | ICD-10-CM | POA: Diagnosis not present

## 2018-03-22 DIAGNOSIS — M6281 Muscle weakness (generalized): Secondary | ICD-10-CM | POA: Diagnosis not present

## 2018-03-22 DIAGNOSIS — R296 Repeated falls: Secondary | ICD-10-CM | POA: Diagnosis not present

## 2018-03-24 ENCOUNTER — Inpatient Hospital Stay: Payer: Medicare Other

## 2018-03-24 ENCOUNTER — Telehealth: Payer: Self-pay

## 2018-03-24 ENCOUNTER — Inpatient Hospital Stay (HOSPITAL_BASED_OUTPATIENT_CLINIC_OR_DEPARTMENT_OTHER): Payer: Medicare Other | Admitting: Hematology and Oncology

## 2018-03-24 ENCOUNTER — Encounter: Payer: Self-pay | Admitting: Hematology and Oncology

## 2018-03-24 ENCOUNTER — Telehealth: Payer: Self-pay | Admitting: Hematology and Oncology

## 2018-03-24 ENCOUNTER — Inpatient Hospital Stay: Payer: Medicare Other | Attending: Hematology and Oncology

## 2018-03-24 ENCOUNTER — Ambulatory Visit (HOSPITAL_COMMUNITY)
Admission: RE | Admit: 2018-03-24 | Discharge: 2018-03-24 | Disposition: A | Discharge status: Home / Self Care | Payer: Medicare Other | Source: Ambulatory Visit | Attending: Hematology and Oncology | Admitting: Hematology and Oncology

## 2018-03-24 VITALS — BP 146/67 | HR 73 | Temp 98.4°F | Resp 18 | Ht 63.0 in | Wt 297.1 lb

## 2018-03-24 DIAGNOSIS — D696 Thrombocytopenia, unspecified: Secondary | ICD-10-CM

## 2018-03-24 DIAGNOSIS — R059 Cough, unspecified: Secondary | ICD-10-CM | POA: Insufficient documentation

## 2018-03-24 DIAGNOSIS — C9110 Chronic lymphocytic leukemia of B-cell type not having achieved remission: Secondary | ICD-10-CM

## 2018-03-24 DIAGNOSIS — C9190 Lymphoid leukemia, unspecified not having achieved remission: Secondary | ICD-10-CM | POA: Insufficient documentation

## 2018-03-24 DIAGNOSIS — R05 Cough: Secondary | ICD-10-CM | POA: Insufficient documentation

## 2018-03-24 DIAGNOSIS — R918 Other nonspecific abnormal finding of lung field: Secondary | ICD-10-CM | POA: Diagnosis not present

## 2018-03-24 LAB — CBC WITH DIFFERENTIAL (CANCER CENTER ONLY)
Basophils Absolute: 0.1 10*3/uL (ref 0.0–0.1)
Basophils Relative: 1 %
Eosinophils Absolute: 0.3 10*3/uL (ref 0.0–0.5)
Eosinophils Relative: 4 %
HCT: 35.3 % (ref 34.8–46.6)
Hemoglobin: 11.6 g/dL (ref 11.6–15.9)
Lymphocytes Relative: 6 %
Lymphs Abs: 0.4 10*3/uL — ABNORMAL LOW (ref 0.9–3.3)
MCH: 27.4 pg (ref 25.1–34.0)
MCHC: 33 g/dL (ref 31.5–36.0)
MCV: 83.1 fL (ref 79.5–101.0)
Monocytes Absolute: 0.7 10*3/uL (ref 0.1–0.9)
Monocytes Relative: 10 %
Neutro Abs: 5.1 10*3/uL (ref 1.5–6.5)
Neutrophils Relative %: 79 %
Platelet Count: 62 10*3/uL — ABNORMAL LOW (ref 145–400)
RBC: 4.25 MIL/uL (ref 3.70–5.45)
RDW: 16.2 % — ABNORMAL HIGH (ref 11.2–14.5)
WBC Count: 6.5 10*3/uL (ref 3.9–10.3)

## 2018-03-24 LAB — CMP (CANCER CENTER ONLY)
ALT: 23 U/L (ref 0–55)
AST: 18 U/L (ref 5–34)
Albumin: 4 g/dL (ref 3.5–5.0)
Alkaline Phosphatase: 67 U/L (ref 40–150)
Anion gap: 11 (ref 3–11)
BUN: 18 mg/dL (ref 7–26)
CO2: 29 mmol/L (ref 22–29)
Calcium: 9.5 mg/dL (ref 8.4–10.4)
Chloride: 100 mmol/L (ref 98–109)
Creatinine: 1.06 mg/dL (ref 0.60–1.10)
GFR, Est AFR Am: 59 mL/min — ABNORMAL LOW (ref >60)
GFR, Estimated: 51 mL/min — ABNORMAL LOW (ref >60)
Glucose, Bld: 104 mg/dL (ref 70–140)
Potassium: 3.9 mmol/L (ref 3.5–5.1)
Sodium: 140 mmol/L (ref 136–145)
Total Bilirubin: 0.5 mg/dL (ref 0.2–1.2)
Total Protein: 6 g/dL — ABNORMAL LOW (ref 6.4–8.3)

## 2018-03-24 LAB — URIC ACID: Uric Acid, Serum: 5.5 mg/dL (ref 2.6–7.4)

## 2018-03-24 MED ORDER — SODIUM CHLORIDE 0.9% FLUSH
10.0000 mL | Freq: Once | INTRAVENOUS | Status: AC
  Administered 2018-03-24: 10 mL
  Filled 2018-03-24: qty 10

## 2018-03-24 MED ORDER — PREDNISONE 20 MG PO TABS: 20.0000 mg | ORAL_TABLET | Freq: Every day | ORAL | 0 refills | Status: DC

## 2018-03-24 NOTE — Telephone Encounter (Signed)
Gave avs and calendar ° °

## 2018-03-24 NOTE — Telephone Encounter (Signed)
-----   Message from Heath Lark, MD sent at 03/24/2018  3:36 PM EDT ----- Regarding: CXR normal PLs call [facility] her niece CXR is negative for penumonia

## 2018-03-24 NOTE — Progress Notes (Signed)
[place] OFFICE PROGRESS NOTE  Patient Care Team: Jonathon Jordan, MD as PCP - General (Family Medicine)  ASSESSMENT & PLAN:  CLL (chronic lymphocytic leukemia) ([place]) She has achieved hematological response Unfortunately, she is still thrombocytopenic I will cancel her treatment today and rescheduled to next month I recommend a trial of low-dose prednisone therapy for a week that might help with her thrombocytopenia, along with her sinus congestion symptoms I plan to repeat imaging after 3 months of treatment  Thrombocytopenia, unspecified ([place]) This is likely due to recent treatment. The patient denies recent history of bleeding such as epistaxis, hematuria or hematochezia. She is asymptomatic from the low platelet count.  I plan to reschedule her treatment for 1 month from now.  She does not need transfusion support I recommend trial of low-dose prednisone for 1 week  Cough She complained of cough Examination is benign I do not believe she has bacterial infection or pneumonia requiring antibiotic therapy I recommend trial of low-dose prednisone I will order chest x-ray today to exclude pneumonia   Orders Placed This Encounter  Procedures  . DG Chest 2 View    Standing Status:   Future    Number of Occurrences:   1    Standing Expiration Date:   03/24/2019    Order Specific Question:   Reason for Exam (SYMPTOM  OR DIAGNOSIS REQUIRED)    Answer:   cough, CLL    Order Specific Question:   Preferred imaging location?    Answer:   [facility]    Order Specific Question:   Radiology Contrast Protocol - do NOT remove file path    Answer:   \\[email or web address].[email or web address]    INTERVAL HISTORY: Please see below for problem oriented charting. She returns with her caregiver today for further follow-up and treatment Overall, she feels well No new lymphadenopathy The patient denies any recent signs or symptoms of bleeding such as  spontaneous epistaxis, hematuria or hematochezia. Recently, she complained of some cough and congestion She is taking some over-the-counter antihistamines She did not report any fever  SUMMARY OF ONCOLOGIC HISTORY: Oncology History   FISH del 13 q     CLL (chronic lymphocytic leukemia) ([place])   07/02/2010 Procedure    LN biopsy confirmed CLL      08/18/2010 Procedure    Bone marrow biopsy confirmed CLL      11/18/2010 - 04/15/2011 Chemotherapy    patient received 6 cycles of FLudarabine and Rituximab      01/13/2012 Relapse/Recurrence    Repeat BM biopsy confirmed relapse      01/27/2012 - 06/23/2012 Chemotherapy    Bendamustine & Rituximab given, complicated by infusion reaction to Rituximab, completed 6 cycles      06/29/2013 Imaging    Ct scan confirmed disease relapse with bulky lymphadenopathy      07/23/2013 Procedure    Repeat BM biopsy due to thrombocytopenia and progressive leukocytosis      08/02/2013 Relapse/Recurrence    Patient consented to start iburitinib as salvage therapy for relapsed CLL      11/09/2013 Imaging    Repeat CT scan the chest, abdomen and pelvis showed greater than 50% response to treatment      06/17/2014 Imaging    Repeat CT scan showed near complete response to treatment.      05/30/2015 Imaging    Repeat CT scan showed near complete response to treatment.      06/14/2016 Imaging    CT scan showed stable  scattered small retroperitoneal lymph nodes and pelvic lymph nodes. No findings for recurrent or progressive lymphoma. Stable mild splenomegaly.      12/30/2017 Imaging    1. Recurrent adenopathy within the chest, abdomen, and pelvis. 2. Splenomegaly. 3. Aortic Atherosclerosis (ICD10-I70.0). 4. Coronary artery calcifications.      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  The catheter is ready for immediate use.      01/24/2018 -  Chemotherapy    The patient had Gazyva and 1 dose of bendamustine. Bendamustine was discontinued due to inability to get insurance approval       REVIEW OF SYSTEMS:   Constitutional: Denies fevers, chills or abnormal weight loss Eyes: Denies blurriness of vision Ears, nose, mouth, throat, and face: Denies mucositis or sore throat Cardiovascular: Denies palpitation, chest discomfort or lower extremity swelling Gastrointestinal:  Denies nausea, heartburn or change in bowel habits Skin: Denies abnormal skin rashes Lymphatics: Denies new lymphadenopathy or easy bruising Neurological:Denies numbness, tingling or new weaknesses Behavioral/Psych: Mood is stable, no new changes  All other systems were reviewed with the patient and are negative.  I have reviewed the past medical history, past surgical history, social history and family history with the patient and they are unchanged from previous note.  ALLERGIES:  is allergic to azithromycin and ibuprofen [ibuprofen].  MEDICATIONS:  Current Outpatient Medications  Medication Sig Dispense Refill  . acyclovir (ZOVIRAX) 400 MG tablet Take 1 tablet (400 mg total) by mouth daily. 30 tablet 5  . allopurinol (ZYLOPRIM) 300 MG tablet Take 1 tablet (300 mg total) by mouth daily. 30 tablet 2  . amLODipine (NORVASC) 10 MG tablet TAKE 1 TABLET (10 MG TOTAL) BY MOUTH DAILY. 90 tablet 1  . bisoprolol (ZEBETA) 5 MG tablet   10  . Carboxymethylcellul-Glycerin (REFRESH OPTIVE SENSITIVE OP) Place 1 drop into both eyes as needed.     . citalopram (CELEXA) 20 MG tablet Take 20 mg by mouth every morning.     . lidocaine-prilocaine (EMLA) cream Apply to affected area once 30 g 3  . loratadine (CLARITIN) 10 MG tablet Take 10 mg daily by mouth.    . losartan-hydrochlorothiazide (HYZAAR) 100-25 MG tablet   9  . Multiple Vitamins-Minerals (PRESERVISION AREDS 2) CAPS Take 1 capsule by mouth 2 (two) times  daily.    . ondansetron (ZOFRAN) 8 MG tablet Take 1 tablet (8 mg total) by mouth every 8 (eight) hours as needed. Then take as needed for nausea or vomiting. 30 tablet 1  . predniSONE (DELTASONE) 20 MG tablet Take 1 tablet (20 mg total) by mouth daily with breakfast. 7 tablet 0  . prochlorperazine (COMPAZINE) 10 MG tablet Take 1 tablet (10 mg total) by mouth every 6 (six) hours as needed (Nausea or vomiting). 30 tablet 1  . pseudoephedrine-guaifenesin (MUCINEX D) 60-600 MG 12 hr tablet Take 1 tablet by mouth every 12 (twelve) hours.    Marland Kitchen tiZANidine (ZANAFLEX) 2 MG tablet Take by mouth every 6 (six) hours as needed for muscle spasms.    . Vitamin D, Ergocalciferol, 2000 units CAPS Take by mouth.     No current facility-administered medications for this visit.     PHYSICAL EXAMINATION: ECOG PERFORMANCE STATUS: 1 - Symptomatic but completely ambulatory  Vitals:   03/24/18 0841  BP: (!) 146/67  Pulse: 73  Resp: 18  Temp: 98.4 F (36.9 C)  SpO2: 95%   Filed Weights   03/24/18 0841  Weight: 297 lb 1.6 oz (134.8 kg)    GENERAL:alert, no distress and comfortable SKIN: skin color, texture, turgor are normal, no rashes or significant lesions EYES: normal, Conjunctiva are pink and non-injected, sclera clear OROPHARYNX:no exudate, no erythema and lips, buccal mucosa, and tongue normal  NECK: supple, thyroid normal size, non-tender, without nodularity LYMPH:  no palpable lymphadenopathy in the cervical, axillary or inguinal LUNGS: clear to auscultation and percussion with normal breathing effort HEART: regular rate & rhythm and no murmurs and no lower extremity edema ABDOMEN:abdomen soft, non-tender and normal bowel sounds Musculoskeletal:no cyanosis of digits and no clubbing  NEURO: alert & oriented x 3 with fluent speech, no focal motor/sensory deficits  LABORATORY DATA:  I have reviewed the data as listed    Component Value Date/Time   NA 140 03/24/2018 0800   NA 141 08/23/2017  1026   K 3.9 03/24/2018 0800   K 3.7 08/23/2017 1026   CL 100 03/24/2018 0800   CL 98 02/02/2013 1353   CO2 29 03/24/2018 0800   CO2 33 (H) 08/23/2017 1026   GLUCOSE 104 03/24/2018 0800   GLUCOSE 92 08/23/2017 1026   GLUCOSE 99 02/02/2013 1353   BUN 18 03/24/2018 0800   BUN 21.0 08/23/2017 1026   CREATININE 1.06 03/24/2018 0800   CREATININE 1.2 (H) 08/23/2017 1026   CALCIUM 9.5 03/24/2018 0800   CALCIUM 9.3 08/23/2017 1026   PROT 6.0 (L) 03/24/2018 0800   PROT 6.3 (L) 08/23/2017 1026   ALBUMIN 4.0 03/24/2018 0800   ALBUMIN 3.8 08/23/2017 1026   AST 18 03/24/2018 0800   AST 11 08/23/2017 1026   ALT 23 03/24/2018 0800   ALT 10 08/23/2017 1026   ALKPHOS 67 03/24/2018 0800   ALKPHOS 66 08/23/2017 1026   BILITOT 0.5 03/24/2018 0800   BILITOT 0.59 08/23/2017 1026   GFRNONAA 51 (L) 03/24/2018 0800   GFRAA 59 (L) 03/24/2018 0800    No results found for: SPEP, UPEP  Lab Results  Component Value Date   WBC 6.5 03/24/2018   NEUTROABS 5.1 03/24/2018   HGB 11.6 03/24/2018   HCT 35.3 03/24/2018   MCV 83.1 03/24/2018   PLT 62 (L) 03/24/2018      Chemistry      Component Value Date/Time   NA 140 03/24/2018 0800   NA 141 08/23/2017 1026   K 3.9 03/24/2018 0800   K 3.7 08/23/2017 1026   CL 100 03/24/2018 0800   CL 98 02/02/2013 1353   CO2 29 03/24/2018 0800   CO2 33 (H) 08/23/2017 1026   BUN 18 03/24/2018 0800   BUN 21.0 08/23/2017 1026   CREATININE 1.06 03/24/2018 0800   CREATININE 1.2 (H) 08/23/2017 1026      Component Value Date/Time   CALCIUM 9.5 03/24/2018 0800   CALCIUM 9.3 08/23/2017 1026   ALKPHOS 67 03/24/2018 0800   ALKPHOS 66 08/23/2017 1026   AST 18 03/24/2018 0800   AST 11 08/23/2017 1026   ALT 23 03/24/2018 0800   ALT 10 08/23/2017 1026   BILITOT 0.5 03/24/2018 0800   BILITOT 0.59 08/23/2017 1026       All questions were answered. The patient knows to call the clinic with any problems, questions or concerns. No barriers to learning was  detected.  I spent 15 minutes counseling the patient face to face. The total time spent in the appointment was  20 minutes and more than 50% was on counseling and review of test results  Heath Lark, MD 03/24/2018 11:23 AM

## 2018-03-24 NOTE — Assessment & Plan Note (Signed)
This is likely due to recent treatment. The patient denies recent history of bleeding such as epistaxis, hematuria or hematochezia. She is asymptomatic from the low platelet count.  I plan to reschedule her treatment for 1 month from now.  She does not need transfusion support I recommend trial of low-dose prednisone for 1 week

## 2018-03-24 NOTE — Assessment & Plan Note (Signed)
She has achieved hematological response Unfortunately, she is still thrombocytopenic I will cancel her treatment today and rescheduled to next month I recommend a trial of low-dose prednisone therapy for a week that might help with her thrombocytopenia, along with her sinus congestion symptoms I plan to repeat imaging after 3 months of treatment

## 2018-03-24 NOTE — Telephone Encounter (Signed)
Called and given below message. She verbalized understanding. 

## 2018-03-24 NOTE — Assessment & Plan Note (Addendum)
She complained of cough Examination is benign I do not believe she has bacterial infection or pneumonia requiring antibiotic therapy I recommend trial of low-dose prednisone I will order chest x-ray today to exclude pneumonia

## 2018-03-27 DIAGNOSIS — R2681 Unsteadiness on feet: Secondary | ICD-10-CM | POA: Diagnosis not present

## 2018-03-27 DIAGNOSIS — M6281 Muscle weakness (generalized): Secondary | ICD-10-CM | POA: Diagnosis not present

## 2018-03-27 DIAGNOSIS — R2689 Other abnormalities of gait and mobility: Secondary | ICD-10-CM | POA: Diagnosis not present

## 2018-03-27 DIAGNOSIS — R296 Repeated falls: Secondary | ICD-10-CM | POA: Diagnosis not present

## 2018-03-28 DIAGNOSIS — R42 Dizziness and giddiness: Secondary | ICD-10-CM | POA: Diagnosis not present

## 2018-03-28 DIAGNOSIS — I10 Essential (primary) hypertension: Secondary | ICD-10-CM | POA: Diagnosis not present

## 2018-03-29 ENCOUNTER — Telehealth: Payer: Self-pay | Admitting: *Deleted

## 2018-03-29 DIAGNOSIS — R2681 Unsteadiness on feet: Secondary | ICD-10-CM | POA: Diagnosis not present

## 2018-03-29 DIAGNOSIS — R296 Repeated falls: Secondary | ICD-10-CM | POA: Diagnosis not present

## 2018-03-29 DIAGNOSIS — M6281 Muscle weakness (generalized): Secondary | ICD-10-CM | POA: Diagnosis not present

## 2018-03-29 DIAGNOSIS — R2689 Other abnormalities of gait and mobility: Secondary | ICD-10-CM | POA: Diagnosis not present

## 2018-03-29 NOTE — Telephone Encounter (Signed)
Dr Jonathon Jordan office called to notify Dr Alvy Bimler that Erin Moore has been cleared to resume driving. Dr Stephanie Acre changed her blood pressure medicine and BP is well controlled. "No dizziness or pre-syncopal episodes".

## 2018-03-30 DIAGNOSIS — R296 Repeated falls: Secondary | ICD-10-CM | POA: Diagnosis not present

## 2018-03-30 DIAGNOSIS — R2681 Unsteadiness on feet: Secondary | ICD-10-CM | POA: Diagnosis not present

## 2018-03-30 DIAGNOSIS — M6281 Muscle weakness (generalized): Secondary | ICD-10-CM | POA: Diagnosis not present

## 2018-03-30 DIAGNOSIS — R2689 Other abnormalities of gait and mobility: Secondary | ICD-10-CM | POA: Diagnosis not present

## 2018-04-03 DIAGNOSIS — M6281 Muscle weakness (generalized): Secondary | ICD-10-CM | POA: Diagnosis not present

## 2018-04-03 DIAGNOSIS — R2689 Other abnormalities of gait and mobility: Secondary | ICD-10-CM | POA: Diagnosis not present

## 2018-04-03 DIAGNOSIS — R2681 Unsteadiness on feet: Secondary | ICD-10-CM | POA: Diagnosis not present

## 2018-04-03 DIAGNOSIS — R296 Repeated falls: Secondary | ICD-10-CM | POA: Diagnosis not present

## 2018-04-04 DIAGNOSIS — R2689 Other abnormalities of gait and mobility: Secondary | ICD-10-CM | POA: Diagnosis not present

## 2018-04-04 DIAGNOSIS — R296 Repeated falls: Secondary | ICD-10-CM | POA: Diagnosis not present

## 2018-04-04 DIAGNOSIS — R2681 Unsteadiness on feet: Secondary | ICD-10-CM | POA: Diagnosis not present

## 2018-04-04 DIAGNOSIS — M6281 Muscle weakness (generalized): Secondary | ICD-10-CM | POA: Diagnosis not present

## 2018-04-06 DIAGNOSIS — R2681 Unsteadiness on feet: Secondary | ICD-10-CM | POA: Diagnosis not present

## 2018-04-06 DIAGNOSIS — M6281 Muscle weakness (generalized): Secondary | ICD-10-CM | POA: Diagnosis not present

## 2018-04-06 DIAGNOSIS — R296 Repeated falls: Secondary | ICD-10-CM | POA: Diagnosis not present

## 2018-04-06 DIAGNOSIS — R2689 Other abnormalities of gait and mobility: Secondary | ICD-10-CM | POA: Diagnosis not present

## 2018-04-10 DIAGNOSIS — R2681 Unsteadiness on feet: Secondary | ICD-10-CM | POA: Diagnosis not present

## 2018-04-10 DIAGNOSIS — M6281 Muscle weakness (generalized): Secondary | ICD-10-CM | POA: Diagnosis not present

## 2018-04-10 DIAGNOSIS — R296 Repeated falls: Secondary | ICD-10-CM | POA: Diagnosis not present

## 2018-04-10 DIAGNOSIS — R2689 Other abnormalities of gait and mobility: Secondary | ICD-10-CM | POA: Diagnosis not present

## 2018-04-11 DIAGNOSIS — R2689 Other abnormalities of gait and mobility: Secondary | ICD-10-CM | POA: Diagnosis not present

## 2018-04-11 DIAGNOSIS — R2681 Unsteadiness on feet: Secondary | ICD-10-CM | POA: Diagnosis not present

## 2018-04-11 DIAGNOSIS — R296 Repeated falls: Secondary | ICD-10-CM | POA: Diagnosis not present

## 2018-04-11 DIAGNOSIS — M6281 Muscle weakness (generalized): Secondary | ICD-10-CM | POA: Diagnosis not present

## 2018-04-17 DIAGNOSIS — R296 Repeated falls: Secondary | ICD-10-CM | POA: Diagnosis not present

## 2018-04-17 DIAGNOSIS — R2681 Unsteadiness on feet: Secondary | ICD-10-CM | POA: Diagnosis not present

## 2018-04-17 DIAGNOSIS — R2689 Other abnormalities of gait and mobility: Secondary | ICD-10-CM | POA: Diagnosis not present

## 2018-04-17 DIAGNOSIS — M6281 Muscle weakness (generalized): Secondary | ICD-10-CM | POA: Diagnosis not present

## 2018-04-18 DIAGNOSIS — R2689 Other abnormalities of gait and mobility: Secondary | ICD-10-CM | POA: Diagnosis not present

## 2018-04-18 DIAGNOSIS — R2681 Unsteadiness on feet: Secondary | ICD-10-CM | POA: Diagnosis not present

## 2018-04-18 DIAGNOSIS — R296 Repeated falls: Secondary | ICD-10-CM | POA: Diagnosis not present

## 2018-04-18 DIAGNOSIS — M6281 Muscle weakness (generalized): Secondary | ICD-10-CM | POA: Diagnosis not present

## 2018-04-21 DIAGNOSIS — M6281 Muscle weakness (generalized): Secondary | ICD-10-CM | POA: Diagnosis not present

## 2018-04-21 DIAGNOSIS — R296 Repeated falls: Secondary | ICD-10-CM | POA: Diagnosis not present

## 2018-04-21 DIAGNOSIS — R2689 Other abnormalities of gait and mobility: Secondary | ICD-10-CM | POA: Diagnosis not present

## 2018-04-21 DIAGNOSIS — R2681 Unsteadiness on feet: Secondary | ICD-10-CM | POA: Diagnosis not present

## 2018-04-24 ENCOUNTER — Telehealth: Payer: Self-pay | Admitting: Hematology and Oncology

## 2018-04-24 ENCOUNTER — Inpatient Hospital Stay: Payer: Medicare Other | Attending: Hematology and Oncology | Admitting: *Deleted

## 2018-04-24 ENCOUNTER — Encounter: Payer: Self-pay | Admitting: Hematology and Oncology

## 2018-04-24 ENCOUNTER — Inpatient Hospital Stay: Payer: Medicare Other | Admitting: *Deleted

## 2018-04-24 ENCOUNTER — Inpatient Hospital Stay (HOSPITAL_BASED_OUTPATIENT_CLINIC_OR_DEPARTMENT_OTHER): Payer: Medicare Other | Admitting: Hematology and Oncology

## 2018-04-24 ENCOUNTER — Inpatient Hospital Stay: Payer: Medicare Other

## 2018-04-24 VITALS — BP 143/68 | HR 65 | Temp 98.1°F | Resp 18

## 2018-04-24 VITALS — BP 143/62 | HR 67 | Temp 98.1°F | Resp 18 | Ht 63.0 in | Wt 297.5 lb

## 2018-04-24 DIAGNOSIS — I10 Essential (primary) hypertension: Secondary | ICD-10-CM | POA: Insufficient documentation

## 2018-04-24 DIAGNOSIS — C9110 Chronic lymphocytic leukemia of B-cell type not having achieved remission: Secondary | ICD-10-CM | POA: Insufficient documentation

## 2018-04-24 DIAGNOSIS — R05 Cough: Secondary | ICD-10-CM | POA: Diagnosis not present

## 2018-04-24 DIAGNOSIS — R059 Cough, unspecified: Secondary | ICD-10-CM

## 2018-04-24 DIAGNOSIS — Z5112 Encounter for antineoplastic immunotherapy: Secondary | ICD-10-CM | POA: Insufficient documentation

## 2018-04-24 DIAGNOSIS — D696 Thrombocytopenia, unspecified: Secondary | ICD-10-CM | POA: Insufficient documentation

## 2018-04-24 DIAGNOSIS — R161 Splenomegaly, not elsewhere classified: Secondary | ICD-10-CM | POA: Insufficient documentation

## 2018-04-24 LAB — CMP (CANCER CENTER ONLY)
ALT: 24 U/L (ref 0–44)
AST: 17 U/L (ref 15–41)
Albumin: 4.1 g/dL (ref 3.5–5.0)
Alkaline Phosphatase: 60 U/L (ref 38–126)
Anion gap: 10 (ref 5–15)
BUN: 16 mg/dL (ref 8–23)
CO2: 32 mmol/L (ref 22–32)
Calcium: 10.2 mg/dL (ref 8.9–10.3)
Chloride: 99 mmol/L (ref 98–111)
Creatinine: 1.02 mg/dL — ABNORMAL HIGH (ref 0.44–1.00)
GFR, Est AFR Am: >60 mL/min (ref >60)
GFR, Estimated: 54 mL/min — ABNORMAL LOW (ref >60)
Glucose, Bld: 111 mg/dL — ABNORMAL HIGH (ref 70–99)
Potassium: 3.6 mmol/L (ref 3.5–5.1)
Sodium: 141 mmol/L (ref 135–145)
Total Bilirubin: 0.5 mg/dL (ref 0.3–1.2)
Total Protein: 6.1 g/dL — ABNORMAL LOW (ref 6.5–8.1)

## 2018-04-24 LAB — CBC WITH DIFFERENTIAL (CANCER CENTER ONLY)
Basophils Absolute: 0 10*3/uL (ref 0.0–0.1)
Basophils Relative: 1 %
Eosinophils Absolute: 0.2 10*3/uL (ref 0.0–0.5)
Eosinophils Relative: 3 %
HCT: 37.3 % (ref 34.8–46.6)
Hemoglobin: 12.1 g/dL (ref 11.6–15.9)
Lymphocytes Relative: 9 %
Lymphs Abs: 0.5 10*3/uL — ABNORMAL LOW (ref 0.9–3.3)
MCH: 27.6 pg (ref 25.1–34.0)
MCHC: 32.4 g/dL (ref 31.5–36.0)
MCV: 85.2 fL (ref 79.5–101.0)
Monocytes Absolute: 0.6 10*3/uL (ref 0.1–0.9)
Monocytes Relative: 11 %
Neutro Abs: 4.3 10*3/uL (ref 1.5–6.5)
Neutrophils Relative %: 76 %
Platelet Count: 81 10*3/uL — ABNORMAL LOW (ref 145–400)
RBC: 4.38 MIL/uL (ref 3.70–5.45)
RDW: 14.2 % (ref 11.2–14.5)
WBC Count: 5.6 10*3/uL (ref 3.9–10.3)

## 2018-04-24 LAB — URIC ACID: Uric Acid, Serum: 5.7 mg/dL (ref 2.5–7.1)

## 2018-04-24 MED ORDER — DEXAMETHASONE SODIUM PHOSPHATE 10 MG/ML IJ SOLN
10.0000 mg | Freq: Once | INTRAMUSCULAR | Status: AC
  Administered 2018-04-24: 10 mg via INTRAVENOUS

## 2018-04-24 MED ORDER — DEXAMETHASONE SODIUM PHOSPHATE 10 MG/ML IJ SOLN
INTRAMUSCULAR | Status: AC
  Filled 2018-04-24: qty 1

## 2018-04-24 MED ORDER — SODIUM CHLORIDE 0.9% FLUSH
10.0000 mL | Freq: Once | INTRAVENOUS | Status: AC
  Administered 2018-04-24: 10 mL
  Filled 2018-04-24: qty 10

## 2018-04-24 MED ORDER — ACETAMINOPHEN 325 MG PO TABS
ORAL_TABLET | ORAL | Status: AC
  Filled 2018-04-24: qty 2

## 2018-04-24 MED ORDER — HEPARIN SOD (PORK) LOCK FLUSH 100 UNIT/ML IV SOLN
500.0000 [IU] | Freq: Once | INTRAVENOUS | Status: AC | PRN
  Administered 2018-04-24: 500 [IU]
  Filled 2018-04-24: qty 5

## 2018-04-24 MED ORDER — TRIAMCINOLONE ACETONIDE 55 MCG/ACT NA AERO: 2.0000 | INHALATION_SPRAY | Freq: Every day | NASAL | 12 refills | Status: DC

## 2018-04-24 MED ORDER — OBINUTUZUMAB CHEMO INJECTION 1000 MG/40ML
1000.0000 mg | Freq: Once | INTRAVENOUS | Status: AC
  Administered 2018-04-24: 1000 mg via INTRAVENOUS
  Filled 2018-04-24: qty 40

## 2018-04-24 MED ORDER — SODIUM CHLORIDE 0.9% FLUSH
10.0000 mL | INTRAVENOUS | Status: DC | PRN
  Administered 2018-04-24: 10 mL
  Filled 2018-04-24: qty 10

## 2018-04-24 MED ORDER — ACETAMINOPHEN 325 MG PO TABS
650.0000 mg | ORAL_TABLET | Freq: Once | ORAL | Status: AC
  Administered 2018-04-24: 650 mg via ORAL

## 2018-04-24 MED ORDER — SODIUM CHLORIDE 0.9 % IV SOLN
Freq: Once | INTRAVENOUS | Status: AC
  Administered 2018-04-24: 10:00:00 via INTRAVENOUS

## 2018-04-24 MED ORDER — DIPHENHYDRAMINE HCL 50 MG/ML IJ SOLN
50.0000 mg | Freq: Once | INTRAMUSCULAR | Status: AC
  Administered 2018-04-24: 50 mg via INTRAVENOUS

## 2018-04-24 MED ORDER — DIPHENHYDRAMINE HCL 50 MG/ML IJ SOLN
INTRAMUSCULAR | Status: AC
  Filled 2018-04-24: qty 1

## 2018-04-24 NOTE — Progress Notes (Signed)
OK to treat with low platelet count per Dr Alvy Bimler.

## 2018-04-24 NOTE — Patient Instructions (Signed)
[facility] Discharge Instructions for Patients Receiving Chemotherapy  Today you received the following chemotherapy agents: Gazyva   To help prevent nausea and vomiting after your treatment, we encourage you to take your nausea medication as directed.    If you develop nausea and vomiting that is not controlled by your nausea medication, call the clinic.   BELOW ARE SYMPTOMS THAT SHOULD BE REPORTED IMMEDIATELY:  *FEVER GREATER THAN 100.5 F  *CHILLS WITH OR WITHOUT FEVER  NAUSEA AND VOMITING THAT IS NOT CONTROLLED WITH YOUR NAUSEA MEDICATION  *UNUSUAL SHORTNESS OF BREATH  *UNUSUAL BRUISING OR BLEEDING  TENDERNESS IN MOUTH AND THROAT WITH OR WITHOUT PRESENCE OF ULCERS  *URINARY PROBLEMS  *BOWEL PROBLEMS  UNUSUAL RASH Items with * indicate a potential emergency and should be followed up as soon as possible.  Feel free to call the clinic should you have any questions or concerns. The clinic phone number is [phone].  Please show the CHEMO ALERT CARD at check-in to the Emergency Department and triage nurse.   

## 2018-04-24 NOTE — Progress Notes (Signed)
Complete 04/24/18 

## 2018-04-24 NOTE — Assessment & Plan Note (Signed)
This is likely due to recent treatment. The patient denies recent history of bleeding such as epistaxis, hematuria or hematochezia. She is asymptomatic from the low platelet count.

## 2018-04-24 NOTE — Assessment & Plan Note (Signed)
She has achieved hematological response Thrombocytopenia is improving slowly We will resume treatment today Given her unresolved cough, I plan to repeat imaging study in 2 weeks for objective assessment of response to therapy

## 2018-04-24 NOTE — Telephone Encounter (Signed)
Gave avs and calendar ° °

## 2018-04-24 NOTE — Progress Notes (Signed)
[place] OFFICE PROGRESS NOTE  Patient Care Team: Jonathon Jordan, MD as PCP - General (Family Medicine)  ASSESSMENT & PLAN:  CLL (chronic lymphocytic leukemia) ([place]) She has achieved hematological response Thrombocytopenia is improving slowly We will resume treatment today Given her unresolved cough, I plan to repeat imaging study in 2 weeks for objective assessment of response to therapy   Thrombocytopenia, unspecified ([place]) This is likely due to recent treatment. The patient denies recent history of bleeding such as epistaxis, hematuria or hematochezia. She is asymptomatic from the low platelet count.    Cough She has unresolved cough Examination is benign I do not believe she needs antibiotic treatment There is a component of sinus drainage I recommend Nasacort as needed   Orders Placed This Encounter  Procedures  . CT CHEST W CONTRAST    Standing Status:   Future    Standing Expiration Date:   04/25/2019    Order Specific Question:   If indicated for the ordered procedure, I authorize the administration of contrast media per Radiology protocol    Answer:   Yes    Order Specific Question:   Preferred imaging location?    Answer:   [facility]    Order Specific Question:   Radiology Contrast Protocol - do NOT remove file path    Answer:   \\[email or web address].pdf  . CT ABDOMEN PELVIS W CONTRAST    Standing Status:   Future    Standing Expiration Date:   04/25/2019    Order Specific Question:   If indicated for the ordered procedure, I authorize the administration of contrast media per Radiology protocol    Answer:   Yes    Order Specific Question:   Preferred imaging location?    Answer:   [facility]    Order Specific Question:   Radiology Contrast Protocol - do NOT remove file path    Answer:   \\[email or web address]    INTERVAL HISTORY: Please see below for problem oriented charting. She  returns with her caregiver for further follow-up She continues to have intermittent cough, worse during morning She has minimum productive cough She has occasional nasal drainage No new lymphadenopathy The patient denies any recent signs or symptoms of bleeding such as spontaneous epistaxis, hematuria or hematochezia.  SUMMARY OF ONCOLOGIC HISTORY: Oncology History   FISH del 13 q     CLL (chronic lymphocytic leukemia) ([place])   07/02/2010 Procedure    LN biopsy confirmed CLL      08/18/2010 Procedure    Bone marrow biopsy confirmed CLL      11/18/2010 - 04/15/2011 Chemotherapy    patient received 6 cycles of FLudarabine and Rituximab      01/13/2012 Relapse/Recurrence    Repeat BM biopsy confirmed relapse      01/27/2012 - 06/23/2012 Chemotherapy    Bendamustine & Rituximab given, complicated by infusion reaction to Rituximab, completed 6 cycles      06/29/2013 Imaging    Ct scan confirmed disease relapse with bulky lymphadenopathy      07/23/2013 Procedure    Repeat BM biopsy due to thrombocytopenia and progressive leukocytosis      08/02/2013 Relapse/Recurrence    Patient consented to start iburitinib as salvage therapy for relapsed CLL      11/09/2013 Imaging    Repeat CT scan the chest, abdomen and pelvis showed greater than 50% response to treatment      06/17/2014 Imaging    Repeat CT scan showed near complete  response to treatment.      05/30/2015 Imaging    Repeat CT scan showed near complete response to treatment.      06/14/2016 Imaging    CT scan showed stable scattered small retroperitoneal lymph nodes and pelvic lymph nodes. No findings for recurrent or progressive lymphoma. Stable mild splenomegaly.      12/30/2017 Imaging    1. Recurrent adenopathy within the chest, abdomen, and pelvis. 2. Splenomegaly. 3. Aortic Atherosclerosis (ICD10-I70.0). 4. Coronary artery calcifications.      01/02/2018 Pathology Results    FISH showed bi-allelic deletion of  13 q      01/17/2018 Procedure    Successful placement of right IJ approach port-a-cath with tip at the superior caval atrial junction. The catheter is ready for immediate use.      01/24/2018 -  Chemotherapy    The patient had Gazyva and 1 dose of bendamustine. Bendamustine was discontinued due to inability to get insurance approval       REVIEW OF SYSTEMS:   Constitutional: Denies fevers, chills or abnormal weight loss Eyes: Denies blurriness of vision Ears, nose, mouth, throat, and face: Denies mucositis or sore throat Cardiovascular: Denies palpitation, chest discomfort or lower extremity swelling Gastrointestinal:  Denies nausea, heartburn or change in bowel habits Skin: Denies abnormal skin rashes Lymphatics: Denies new lymphadenopathy or easy bruising Neurological:Denies numbness, tingling or new weaknesses Behavioral/Psych: Mood is stable, no new changes  All other systems were reviewed with the patient and are negative.  I have reviewed the past medical history, past surgical history, social history and family history with the patient and they are unchanged from previous note.  ALLERGIES:  is allergic to azithromycin and ibuprofen [ibuprofen].  MEDICATIONS:  Current Outpatient Medications  Medication Sig Dispense Refill  . acyclovir (ZOVIRAX) 400 MG tablet Take 1 tablet (400 mg total) by mouth daily. 30 tablet 5  . allopurinol (ZYLOPRIM) 300 MG tablet Take 1 tablet (300 mg total) by mouth daily. 30 tablet 2  . amLODipine (NORVASC) 10 MG tablet TAKE 1 TABLET (10 MG TOTAL) BY MOUTH DAILY. 90 tablet 1  . bisoprolol (ZEBETA) 5 MG tablet   10  . Carboxymethylcellul-Glycerin (REFRESH OPTIVE SENSITIVE OP) Place 1 drop into both eyes as needed.     . citalopram (CELEXA) 20 MG tablet Take 20 mg by mouth every morning.     . lidocaine-prilocaine (EMLA) cream Apply to affected area once 30 g 3  . loratadine (CLARITIN) 10 MG tablet Take 10 mg daily by mouth.    .  losartan-hydrochlorothiazide (HYZAAR) 100-25 MG tablet   9  . Multiple Vitamins-Minerals (PRESERVISION AREDS 2) CAPS Take 1 capsule by mouth 2 (two) times daily.    . ondansetron (ZOFRAN) 8 MG tablet Take 1 tablet (8 mg total) by mouth every 8 (eight) hours as needed. Then take as needed for nausea or vomiting. 30 tablet 1  . predniSONE (DELTASONE) 20 MG tablet Take 1 tablet (20 mg total) by mouth daily with breakfast. 7 tablet 0  . prochlorperazine (COMPAZINE) 10 MG tablet Take 1 tablet (10 mg total) by mouth every 6 (six) hours as needed (Nausea or vomiting). 30 tablet 1  . pseudoephedrine-guaifenesin (MUCINEX D) 60-600 MG 12 hr tablet Take 1 tablet by mouth every 12 (twelve) hours.    Marland Kitchen tiZANidine (ZANAFLEX) 2 MG tablet Take by mouth every 6 (six) hours as needed for muscle spasms.    Marland Kitchen triamcinolone (NASACORT) 55 MCG/ACT AERO nasal inhaler Place 2 sprays into the  nose daily. 1 Inhaler 12  . Vitamin D, Ergocalciferol, 2000 units CAPS Take by mouth.     No current facility-administered medications for this visit.    Facility-Administered Medications Ordered in Other Visits  Medication Dose Route Frequency Provider Last Rate Last Dose  . 0.9 %  sodium chloride infusion   Intravenous Once Alvy Bimler, Mercades Bajaj, MD      . acetaminophen (TYLENOL) tablet 650 mg  650 mg Oral Once Alvy Bimler, Seyed Heffley, MD      . dexamethasone (DECADRON) injection 10 mg  10 mg Intravenous Once Alvy Bimler, Aniel Hubble, MD      . diphenhydrAMINE (BENADRYL) injection 50 mg  50 mg Intravenous Once Alvy Bimler, Grecia Lynk, MD      . heparin lock flush 100 unit/mL  500 Units Intracatheter Once PRN Alvy Bimler, Samanthajo Payano, MD      . obinutuzumab (GAZYVA) 1,000 mg in sodium chloride 0.9 % 250 mL (3.4483 mg/mL) chemo infusion  1,000 mg Intravenous Once Makhya Arave, MD      . sodium chloride flush (NS) 0.9 % injection 10 mL  10 mL Intracatheter PRN Alvy Bimler, Sharie Amorin, MD        PHYSICAL EXAMINATION: ECOG PERFORMANCE STATUS: 1 - Symptomatic but completely ambulatory  Vitals:    04/24/18 0839  BP: (!) 143/62  Pulse: 67  Resp: 18  Temp: 98.1 F (36.7 C)  SpO2: 97%   Filed Weights   04/24/18 0839  Weight: 297 lb 8 oz (134.9 kg)    GENERAL:alert, no distress and comfortable SKIN: skin color, texture, turgor are normal, no rashes or significant lesions EYES: normal, Conjunctiva are pink and non-injected, sclera clear OROPHARYNX:no exudate, no erythema and lips, buccal mucosa, and tongue normal  NECK: supple, thyroid normal size, non-tender, without nodularity LYMPH:  no palpable lymphadenopathy in the cervical, axillary or inguinal LUNGS: clear to auscultation and percussion with normal breathing effort HEART: regular rate & rhythm and no murmurs and no lower extremity edema ABDOMEN:abdomen soft, non-tender and normal bowel sounds Musculoskeletal:no cyanosis of digits and no clubbing  NEURO: alert & oriented x 3 with fluent speech, no focal motor/sensory deficits  LABORATORY DATA:  I have reviewed the data as listed    Component Value Date/Time   NA 141 04/24/2018 0813   NA 141 08/23/2017 1026   K 3.6 04/24/2018 0813   K 3.7 08/23/2017 1026   CL 99 04/24/2018 0813   CL 98 02/02/2013 1353   CO2 32 04/24/2018 0813   CO2 33 (H) 08/23/2017 1026   GLUCOSE 111 (H) 04/24/2018 0813   GLUCOSE 92 08/23/2017 1026   GLUCOSE 99 02/02/2013 1353   BUN 16 04/24/2018 0813   BUN 21.0 08/23/2017 1026   CREATININE 1.02 (H) 04/24/2018 0813   CREATININE 1.2 (H) 08/23/2017 1026   CALCIUM 10.2 04/24/2018 0813   CALCIUM 9.3 08/23/2017 1026   PROT 6.1 (L) 04/24/2018 0813   PROT 6.3 (L) 08/23/2017 1026   ALBUMIN 4.1 04/24/2018 0813   ALBUMIN 3.8 08/23/2017 1026   AST 17 04/24/2018 0813   AST 11 08/23/2017 1026   ALT 24 04/24/2018 0813   ALT 10 08/23/2017 1026   ALKPHOS 60 04/24/2018 0813   ALKPHOS 66 08/23/2017 1026   BILITOT 0.5 04/24/2018 0813   BILITOT 0.59 08/23/2017 1026   GFRNONAA 54 (L) 04/24/2018 0813   GFRAA >60 04/24/2018 0813    No results found  for: SPEP, UPEP  Lab Results  Component Value Date   WBC 5.6 04/24/2018   NEUTROABS 4.3 04/24/2018  HGB 12.1 04/24/2018   HCT 37.3 04/24/2018   MCV 85.2 04/24/2018   PLT 81 (L) 04/24/2018      Chemistry      Component Value Date/Time   NA 141 04/24/2018 0813   NA 141 08/23/2017 1026   K 3.6 04/24/2018 0813   K 3.7 08/23/2017 1026   CL 99 04/24/2018 0813   CL 98 02/02/2013 1353   CO2 32 04/24/2018 0813   CO2 33 (H) 08/23/2017 1026   BUN 16 04/24/2018 0813   BUN 21.0 08/23/2017 1026   CREATININE 1.02 (H) 04/24/2018 0813   CREATININE 1.2 (H) 08/23/2017 1026      Component Value Date/Time   CALCIUM 10.2 04/24/2018 0813   CALCIUM 9.3 08/23/2017 1026   ALKPHOS 60 04/24/2018 0813   ALKPHOS 66 08/23/2017 1026   AST 17 04/24/2018 0813   AST 11 08/23/2017 1026   ALT 24 04/24/2018 0813   ALT 10 08/23/2017 1026   BILITOT 0.5 04/24/2018 0813   BILITOT 0.59 08/23/2017 1026       All questions were answered. The patient knows to call the clinic with any problems, questions or concerns. No barriers to learning was detected.  I spent 15 minutes counseling the patient face to face. The total time spent in the appointment was 20 minutes and more than 50% was on counseling and review of test results  Heath Lark, MD 04/24/2018 10:01 AM

## 2018-04-24 NOTE — Assessment & Plan Note (Signed)
She has unresolved cough Examination is benign I do not believe she needs antibiotic treatment There is a component of sinus drainage I recommend Nasacort as needed

## 2018-04-25 DIAGNOSIS — R296 Repeated falls: Secondary | ICD-10-CM | POA: Diagnosis not present

## 2018-04-25 DIAGNOSIS — R2681 Unsteadiness on feet: Secondary | ICD-10-CM | POA: Diagnosis not present

## 2018-04-25 DIAGNOSIS — M6281 Muscle weakness (generalized): Secondary | ICD-10-CM | POA: Diagnosis not present

## 2018-04-25 DIAGNOSIS — R2689 Other abnormalities of gait and mobility: Secondary | ICD-10-CM | POA: Diagnosis not present

## 2018-04-26 DIAGNOSIS — R296 Repeated falls: Secondary | ICD-10-CM | POA: Diagnosis not present

## 2018-04-26 DIAGNOSIS — R2681 Unsteadiness on feet: Secondary | ICD-10-CM | POA: Diagnosis not present

## 2018-04-26 DIAGNOSIS — M6281 Muscle weakness (generalized): Secondary | ICD-10-CM | POA: Diagnosis not present

## 2018-04-26 DIAGNOSIS — R2689 Other abnormalities of gait and mobility: Secondary | ICD-10-CM | POA: Diagnosis not present

## 2018-04-27 DIAGNOSIS — M6281 Muscle weakness (generalized): Secondary | ICD-10-CM | POA: Diagnosis not present

## 2018-04-27 DIAGNOSIS — R2681 Unsteadiness on feet: Secondary | ICD-10-CM | POA: Diagnosis not present

## 2018-04-27 DIAGNOSIS — R2689 Other abnormalities of gait and mobility: Secondary | ICD-10-CM | POA: Diagnosis not present

## 2018-04-27 DIAGNOSIS — R296 Repeated falls: Secondary | ICD-10-CM | POA: Diagnosis not present

## 2018-05-01 DIAGNOSIS — M6281 Muscle weakness (generalized): Secondary | ICD-10-CM | POA: Diagnosis not present

## 2018-05-01 DIAGNOSIS — R2681 Unsteadiness on feet: Secondary | ICD-10-CM | POA: Diagnosis not present

## 2018-05-01 DIAGNOSIS — R296 Repeated falls: Secondary | ICD-10-CM | POA: Diagnosis not present

## 2018-05-01 DIAGNOSIS — R2689 Other abnormalities of gait and mobility: Secondary | ICD-10-CM | POA: Diagnosis not present

## 2018-05-02 DIAGNOSIS — R2689 Other abnormalities of gait and mobility: Secondary | ICD-10-CM | POA: Diagnosis not present

## 2018-05-02 DIAGNOSIS — R296 Repeated falls: Secondary | ICD-10-CM | POA: Diagnosis not present

## 2018-05-02 DIAGNOSIS — M6281 Muscle weakness (generalized): Secondary | ICD-10-CM | POA: Diagnosis not present

## 2018-05-02 DIAGNOSIS — R2681 Unsteadiness on feet: Secondary | ICD-10-CM | POA: Diagnosis not present

## 2018-05-04 DIAGNOSIS — R2689 Other abnormalities of gait and mobility: Secondary | ICD-10-CM | POA: Diagnosis not present

## 2018-05-04 DIAGNOSIS — R296 Repeated falls: Secondary | ICD-10-CM | POA: Diagnosis not present

## 2018-05-04 DIAGNOSIS — R2681 Unsteadiness on feet: Secondary | ICD-10-CM | POA: Diagnosis not present

## 2018-05-04 DIAGNOSIS — M6281 Muscle weakness (generalized): Secondary | ICD-10-CM | POA: Diagnosis not present

## 2018-05-08 ENCOUNTER — Inpatient Hospital Stay: Payer: Medicare Other

## 2018-05-08 ENCOUNTER — Ambulatory Visit (HOSPITAL_COMMUNITY)
Admission: RE | Admit: 2018-05-08 | Discharge: 2018-05-08 | Disposition: A | Discharge status: Home / Self Care | Payer: Medicare Other | Source: Ambulatory Visit | Attending: Hematology and Oncology | Admitting: Hematology and Oncology

## 2018-05-08 DIAGNOSIS — I700 Atherosclerosis of aorta: Secondary | ICD-10-CM | POA: Insufficient documentation

## 2018-05-08 DIAGNOSIS — R2689 Other abnormalities of gait and mobility: Secondary | ICD-10-CM | POA: Diagnosis not present

## 2018-05-08 DIAGNOSIS — C9110 Chronic lymphocytic leukemia of B-cell type not having achieved remission: Secondary | ICD-10-CM

## 2018-05-08 DIAGNOSIS — M6281 Muscle weakness (generalized): Secondary | ICD-10-CM | POA: Diagnosis not present

## 2018-05-08 DIAGNOSIS — R161 Splenomegaly, not elsewhere classified: Secondary | ICD-10-CM | POA: Insufficient documentation

## 2018-05-08 DIAGNOSIS — R590 Localized enlarged lymph nodes: Secondary | ICD-10-CM | POA: Diagnosis not present

## 2018-05-08 DIAGNOSIS — C9190 Lymphoid leukemia, unspecified not having achieved remission: Secondary | ICD-10-CM | POA: Insufficient documentation

## 2018-05-08 DIAGNOSIS — R296 Repeated falls: Secondary | ICD-10-CM | POA: Diagnosis not present

## 2018-05-08 DIAGNOSIS — Z5112 Encounter for antineoplastic immunotherapy: Secondary | ICD-10-CM | POA: Diagnosis not present

## 2018-05-08 DIAGNOSIS — R2681 Unsteadiness on feet: Secondary | ICD-10-CM | POA: Diagnosis not present

## 2018-05-08 LAB — CMP (CANCER CENTER ONLY)
ALT: 29 U/L (ref 0–44)
AST: 21 U/L (ref 15–41)
Albumin: 4 g/dL (ref 3.5–5.0)
Alkaline Phosphatase: 68 U/L (ref 38–126)
Anion gap: 11 (ref 5–15)
BUN: 19 mg/dL (ref 8–23)
CO2: 31 mmol/L (ref 22–32)
Calcium: 9.9 mg/dL (ref 8.9–10.3)
Chloride: 101 mmol/L (ref 98–111)
Creatinine: 1.05 mg/dL — ABNORMAL HIGH (ref 0.44–1.00)
GFR, Est AFR Am: 60 mL/min — ABNORMAL LOW (ref >60)
GFR, Estimated: 52 mL/min — ABNORMAL LOW (ref >60)
Glucose, Bld: 108 mg/dL — ABNORMAL HIGH (ref 70–99)
Potassium: 3.7 mmol/L (ref 3.5–5.1)
Sodium: 143 mmol/L (ref 135–145)
Total Bilirubin: 0.4 mg/dL (ref 0.3–1.2)
Total Protein: 6.1 g/dL — ABNORMAL LOW (ref 6.5–8.1)

## 2018-05-08 LAB — CBC WITH DIFFERENTIAL (CANCER CENTER ONLY)
Basophils Absolute: 0.1 10*3/uL (ref 0.0–0.1)
Basophils Relative: 1 %
Eosinophils Absolute: 0.2 10*3/uL (ref 0.0–0.5)
Eosinophils Relative: 4 %
HCT: 36.6 % (ref 34.8–46.6)
Hemoglobin: 12 g/dL (ref 11.6–15.9)
Lymphocytes Relative: 7 %
Lymphs Abs: 0.5 10*3/uL — ABNORMAL LOW (ref 0.9–3.3)
MCH: 27.5 pg (ref 25.1–34.0)
MCHC: 32.8 g/dL (ref 31.5–36.0)
MCV: 83.6 fL (ref 79.5–101.0)
Monocytes Absolute: 0.7 10*3/uL (ref 0.1–0.9)
Monocytes Relative: 11 %
Neutro Abs: 5.3 10*3/uL (ref 1.5–6.5)
Neutrophils Relative %: 77 %
Platelet Count: 43 10*3/uL — ABNORMAL LOW (ref 145–400)
RBC: 4.38 MIL/uL (ref 3.70–5.45)
RDW: 15.6 % — ABNORMAL HIGH (ref 11.2–14.5)
WBC Count: 6.8 10*3/uL (ref 3.9–10.3)

## 2018-05-08 LAB — URIC ACID: Uric Acid, Serum: 5.7 mg/dL (ref 2.5–7.1)

## 2018-05-08 MED ORDER — HEPARIN SOD (PORK) LOCK FLUSH 100 UNIT/ML IV SOLN
INTRAVENOUS | Status: AC
  Filled 2018-05-08: qty 5

## 2018-05-08 MED ORDER — HEPARIN SOD (PORK) LOCK FLUSH 100 UNIT/ML IV SOLN
500.0000 [IU] | Freq: Once | INTRAVENOUS | Status: AC
  Administered 2018-05-08: 500 [IU] via INTRAVENOUS

## 2018-05-08 MED ORDER — IOPAMIDOL (ISOVUE-300) INJECTION 61%
100.0000 mL | Freq: Once | INTRAVENOUS | Status: AC | PRN
  Administered 2018-05-08: 100 mL via INTRAVENOUS

## 2018-05-08 MED ORDER — IOPAMIDOL (ISOVUE-300) INJECTION 61%
INTRAVENOUS | Status: AC
  Filled 2018-05-08: qty 100

## 2018-05-08 MED ORDER — SODIUM CHLORIDE 0.9% FLUSH
10.0000 mL | Freq: Once | INTRAVENOUS | Status: AC
  Administered 2018-05-08: 10 mL
  Filled 2018-05-08: qty 10

## 2018-05-08 NOTE — Patient Instructions (Signed)
Central Line, Adult A central line is a thin, flexible tube (catheter) that is put in your vein. It can be used to:  Take blood for lab tests.  Give you medicine.  Give you food and nutrients.  The procedure may vary among doctors and hospitals. Follow these instructions at home: Caring for the tube  Follow instructions from your doctor about: ? Flushing the tube with saline solution. ? Cleaning the tube and the area around it.  Only flush with clean (sterile) supplies. The supplies should be from your doctor, a pharmacy, or another place that your doctor recommends.  Before you flush the tube or clean the area around the tube: ? Wash your hands with soap and water. If you cannot use soap and water, use hand sanitizer. ? Clean the central line hub with rubbing alcohol. Caring for your skin  Keep the area where the tube was put in clean and dry.  Every day, and when changing the bandage, check the skin around the central line for: ? Redness, swelling, or pain. ? Fluid or blood. ? Warmth. ? Pus. ? A bad smell. General instructions  Keep the tube clamped, unless it is being used.  Keep your supplies in a clean, dry location.  If you or someone else accidentally pulls on the tube, make sure: ? The bandage (dressing) is okay. ? There is no bleeding. ? The tube has not been pulled out.  Do not use scissors or sharp objects near the tube.  Do not swim or let the tube soak in a tub.  Ask your doctor what activities are safe for you. Your doctor may tell you not to lift anything or move your arm too much.  Take over-the-counter and prescription medicines only as told by your doctor.  Change bandages as told by your doctor.  Keep your bandage dry. If a bandage gets wet, have it changed right away.  Keep all follow-up visits as told by your doctor. This is important. Throwing away supplies  Throw away any syringes in a trash (disposal) container that is only for sharp  items (sharps container). You can buy a sharps container from a pharmacy, or you can make one by using an empty hard plastic bottle with a cover.  Place any used bandages or infusion bags into a plastic bag. Throw that bag in the trash. Contact a doctor if:  You have any of these where the tube was put in: ? Redness, swelling, or pain. ? Fluid or blood. ? A warm feeling. ? Pus or a bad smell. Get help right away if:  You have: ? A fever. ? Chills. ? Trouble getting enough air (shortness of breath). ? Trouble breathing. ? Pain in your chest. ? Swelling in your neck, face, chest, or arm.  You are coughing.  You feel your heart beating fast or skipping beats.  You feel dizzy or you pass out (faint).  There are red lines coming from where the tube was put in.  The area where the tube was put in is bleeding and the bleeding will not stop.  Your tube is hard to flush.  You do not get a blood return from the tube.  The tube gets loose or comes out.  The tube has a hole or a tear.  The tube leaks. Summary  A central line is a thin, flexible tube (catheter) that is put in your vein. It can be used to take blood for lab tests or to   give you medicine.  Follow instructions from your doctor about flushing and cleaning the tube.  Keep the area where the tube was put in clean and dry.  Ask your doctor what activities are safe for you. This information is not intended to replace advice given to you by your health care provider. Make sure you discuss any questions you have with your health care provider. Document Released: 09/20/2012 Document Revised: 10/21/2016 Document Reviewed: 10/21/2016 Elsevier Interactive Patient Education  2017 [place] An implanted port is a type of central line that is placed under the skin. Central lines are used to provide IV access when treatment or nutrition needs to be given through a person's veins. Implanted ports  are used for long-term IV access. An implanted port may be placed because:  You need IV medicine that would be irritating to the small veins in your hands or arms.  You need long-term IV medicines, such as antibiotics.  You need IV nutrition for a long period.  You need frequent blood draws for lab tests.  You need dialysis.  Implanted ports are usually placed in the chest area, but they can also be placed in the upper arm, the abdomen, or the leg. An implanted port has two main parts:  Reservoir. The reservoir is round and will appear as a small, raised area under your skin. The reservoir is the part where a needle is inserted to give medicines or draw blood.  Catheter. The catheter is a thin, flexible tube that extends from the reservoir. The catheter is placed into a large vein. Medicine that is inserted into the reservoir goes into the catheter and then into the vein.  How will I care for my incision site? Do not get the incision site wet. Bathe or shower as directed by your health care provider. How is my port accessed? Special steps must be taken to access the port:  Before the port is accessed, a numbing cream can be placed on the skin. This helps numb the skin over the port site.  Your health care provider uses a sterile technique to access the port. ? Your health care provider must put on a mask and sterile gloves. ? The skin over your port is cleaned carefully with an antiseptic and allowed to dry. ? The port is gently pinched between sterile gloves, and a needle is inserted into the port.  Only "non-coring" port needles should be used to access the port. Once the port is accessed, a blood return should be checked. This helps ensure that the port is in the vein and is not clogged.  If your port needs to remain accessed for a constant infusion, a clear (transparent) bandage will be placed over the needle site. The bandage and needle will need to be changed every week, or as  directed by your health care provider.  Keep the bandage covering the needle clean and dry. Do not get it wet. Follow your health care provider's instructions on how to take a shower or bath while the port is accessed.  If your port does not need to stay accessed, no bandage is needed over the port.  What is flushing? Flushing helps keep the port from getting clogged. Follow your health care provider's instructions on how and when to flush the port. Ports are usually flushed with saline solution or a medicine called heparin. The need for flushing will depend on how the port is used.  If the port  is used for intermittent medicines or blood draws, the port will need to be flushed: ? After medicines have been given. ? After blood has been drawn. ? As part of routine maintenance.  If a constant infusion is running, the port may not need to be flushed.  How long will my port stay implanted? The port can stay in for as long as your health care provider thinks it is needed. When it is time for the port to come out, surgery will be done to remove it. The procedure is similar to the one performed when the port was put in. When should I seek immediate medical care? When you have an implanted port, you should seek immediate medical care if:  You notice a bad smell coming from the incision site.  You have swelling, redness, or drainage at the incision site.  You have more swelling or pain at the port site or the surrounding area.  You have a fever that is not controlled with medicine.  This information is not intended to replace advice given to you by your health care provider. Make sure you discuss any questions you have with your health care provider. Document Released: 10/04/2005 Document Revised: 03/11/2016 Document Reviewed: 06/11/2013 Elsevier Interactive Patient Education  2017 [employer].

## 2018-05-09 ENCOUNTER — Telehealth: Payer: Self-pay | Admitting: Hematology and Oncology

## 2018-05-09 ENCOUNTER — Ambulatory Visit: Payer: Medicare Other | Admitting: Hematology and Oncology

## 2018-05-09 NOTE — Telephone Encounter (Signed)
Tried to call Traci the voicemail was full could not leave a message to reschedule

## 2018-05-10 ENCOUNTER — Encounter: Payer: Self-pay | Admitting: Hematology and Oncology

## 2018-05-10 ENCOUNTER — Inpatient Hospital Stay: Payer: Medicare Other | Admitting: Hematology and Oncology

## 2018-05-10 ENCOUNTER — Telehealth: Payer: Self-pay | Admitting: Hematology and Oncology

## 2018-05-10 DIAGNOSIS — R161 Splenomegaly, not elsewhere classified: Secondary | ICD-10-CM | POA: Insufficient documentation

## 2018-05-10 DIAGNOSIS — I10 Essential (primary) hypertension: Secondary | ICD-10-CM | POA: Diagnosis not present

## 2018-05-10 DIAGNOSIS — D696 Thrombocytopenia, unspecified: Secondary | ICD-10-CM | POA: Diagnosis not present

## 2018-05-10 DIAGNOSIS — C9110 Chronic lymphocytic leukemia of B-cell type not having achieved remission: Secondary | ICD-10-CM | POA: Diagnosis not present

## 2018-05-10 DIAGNOSIS — R059 Cough, unspecified: Secondary | ICD-10-CM

## 2018-05-10 DIAGNOSIS — Z5112 Encounter for antineoplastic immunotherapy: Secondary | ICD-10-CM | POA: Diagnosis not present

## 2018-05-10 DIAGNOSIS — R05 Cough: Secondary | ICD-10-CM

## 2018-05-10 NOTE — Assessment & Plan Note (Signed)
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

## 2018-05-10 NOTE — Assessment & Plan Note (Signed)
She has chronic splenomegaly, likely element of disease versus congestion from liver disease This is likely the cause of thrombocytopenia She is not symptomatic We will monitor only

## 2018-05-10 NOTE — Assessment & Plan Note (Signed)
She has chronic cough No signs of infection is noted It is likely due to allergies/sinus drainage I recommend her to continue conservative management with Nasacort We discussed potential referral to see pulmonologist but she is comfortable with the plan.

## 2018-05-10 NOTE — Progress Notes (Signed)
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 The patient denies recent history of bleeding such as epistaxis, hematuria or hematochezia. She is asymptomatic from the low platelet count.  We will proceed with treatment regardless of platelet count   Cough She has chronic cough No signs of infection is noted It is likely due to allergies/sinus drainage I recommend her to continue conservative management with Nasacort We discussed potential referral to see pulmonologist but she is comfortable with the plan.  Essential hypertension The patient has element of whitecoat hypertension She will continue close monitoring of blood pressure at home  Splenomegaly She has chronic splenomegaly, likely element of disease versus congestion from liver disease This is likely the cause of thrombocytopenia She is not symptomatic We will monitor only   No orders of the defined types were placed in this encounter.   INTERVAL HISTORY: Please see below for problem oriented charting. She returns to reschedule her missed appointment 2 days ago She feels well. She continues to have chronic cough No new lymphadenopathy She denies recent infection, fever or chills She has chronic nasal drainage, stable The patient denies any recent signs or symptoms of bleeding such as spontaneous epistaxis, hematuria or hematochezia.  SUMMARY OF ONCOLOGIC HISTORY: Oncology  History   FISH del 13 q     CLL (chronic lymphocytic leukemia) ([place])   07/02/2010 Procedure    LN biopsy confirmed CLL      08/18/2010 Procedure    Bone marrow biopsy confirmed CLL      11/18/2010 - 04/15/2011 Chemotherapy    patient received 6 cycles of FLudarabine and Rituximab      01/13/2012 Relapse/Recurrence    Repeat BM biopsy confirmed relapse      01/27/2012 - 06/23/2012 Chemotherapy    Bendamustine & Rituximab given, complicated by infusion reaction to Rituximab, completed 6 cycles      06/29/2013 Imaging    Ct scan confirmed disease relapse with bulky lymphadenopathy      07/23/2013 Procedure    Repeat BM biopsy due to thrombocytopenia and progressive leukocytosis      08/02/2013 Relapse/Recurrence    Patient consented to start iburitinib as salvage therapy for relapsed CLL      11/09/2013 Imaging    Repeat CT scan the chest, abdomen and pelvis showed greater than 50% response to treatment      06/17/2014 Imaging    Repeat CT scan showed near complete response to treatment.      05/30/2015 Imaging    Repeat CT scan showed near complete response to treatment.      06/14/2016 Imaging    CT scan showed stable scattered small retroperitoneal lymph nodes and pelvic lymph nodes. No findings for recurrent or progressive lymphoma. Stable mild splenomegaly.      12/30/2017 Imaging    1. Recurrent adenopathy within the chest, abdomen, and pelvis. 2. Splenomegaly. 3. Aortic Atherosclerosis (ICD10-I70.0). 4. Coronary artery calcifications.      01/02/2018 Pathology Results    FISH showed bi-allelic deletion of 13 q  01/17/2018 Procedure    Successful placement of right IJ approach port-a-cath with tip at the superior caval atrial junction. The catheter is ready for immediate use.      01/24/2018 -  Chemotherapy    The patient had Gazyva and 1 dose of bendamustine. Bendamustine was discontinued due to inability to get insurance approval      05/08/2018 Imaging     1. Interval partial treatment response. Bilateral axillary, mediastinal, right hilar, retroperitoneal and bilateral pelvic adenopathy is all decreased. Mild splenomegaly, decreased. No new or progressive disease. 2. Aortic Atherosclerosis (ICD10-I70.0).       REVIEW OF SYSTEMS:   Constitutional: Denies fevers, chills or abnormal weight loss Eyes: Denies blurriness of vision Ears, nose, mouth, throat, and face: Denies mucositis or sore throat Cardiovascular: Denies palpitation, chest discomfort or lower extremity swelling Gastrointestinal:  Denies nausea, heartburn or change in bowel habits Skin: Denies abnormal skin rashes Lymphatics: Denies new lymphadenopathy or easy bruising Neurological:Denies numbness, tingling or new weaknesses Behavioral/Psych: Mood is stable, no new changes  All other systems were reviewed with the patient and are negative.  I have reviewed the past medical history, past surgical history, social history and family history with the patient and they are unchanged from previous note.  ALLERGIES:  is allergic to azithromycin and ibuprofen [ibuprofen].  MEDICATIONS:  Current Outpatient Medications  Medication Sig Dispense Refill  . acyclovir (ZOVIRAX) 400 MG tablet Take 1 tablet (400 mg total) by mouth daily. 30 tablet 5  . allopurinol (ZYLOPRIM) 300 MG tablet Take 1 tablet (300 mg total) by mouth daily. 30 tablet 2  . amLODipine (NORVASC) 10 MG tablet TAKE 1 TABLET (10 MG TOTAL) BY MOUTH DAILY. 90 tablet 1  . bisoprolol (ZEBETA) 5 MG tablet   10  . Carboxymethylcellul-Glycerin (REFRESH OPTIVE SENSITIVE OP) Place 1 drop into both eyes as needed.     . citalopram (CELEXA) 20 MG tablet Take 20 mg by mouth every morning.     . lidocaine-prilocaine (EMLA) cream Apply to affected area once 30 g 3  . loratadine (CLARITIN) 10 MG tablet Take 10 mg daily by mouth.    . losartan-hydrochlorothiazide (HYZAAR) 100-25 MG tablet   9  . Multiple Vitamins-Minerals (PRESERVISION  AREDS 2) CAPS Take 1 capsule by mouth 2 (two) times daily.    . ondansetron (ZOFRAN) 8 MG tablet Take 1 tablet (8 mg total) by mouth every 8 (eight) hours as needed. Then take as needed for nausea or vomiting. 30 tablet 1  . predniSONE (DELTASONE) 20 MG tablet Take 1 tablet (20 mg total) by mouth daily with breakfast. 7 tablet 0  . prochlorperazine (COMPAZINE) 10 MG tablet Take 1 tablet (10 mg total) by mouth every 6 (six) hours as needed (Nausea or vomiting). 30 tablet 1  . pseudoephedrine-guaifenesin (MUCINEX D) 60-600 MG 12 hr tablet Take 1 tablet by mouth every 12 (twelve) hours.    Marland Kitchen tiZANidine (ZANAFLEX) 2 MG tablet Take by mouth every 6 (six) hours as needed for muscle spasms.    Marland Kitchen triamcinolone (NASACORT) 55 MCG/ACT AERO nasal inhaler Place 2 sprays into the nose daily. 1 Inhaler 12  . Vitamin D, Ergocalciferol, 2000 units CAPS Take by mouth.     No current facility-administered medications for this visit.     PHYSICAL EXAMINATION: ECOG PERFORMANCE STATUS: 1 - Symptomatic but completely ambulatory  Vitals:   05/10/18 0904  BP: (!) 188/75  Pulse: 85  Resp: 18  Temp: 98.4 F (36.9 C)  SpO2: 96%  Filed Weights   05/10/18 0904  Weight: 297 lb 1.6 oz (134.8 kg)    GENERAL:alert, no distress and comfortable SKIN: skin color, texture, turgor are normal, no rashes or significant lesions EYES: normal, Conjunctiva are pink and non-injected, sclera clear OROPHARYNX:no exudate, no erythema and lips, buccal mucosa, and tongue normal  NECK: supple, thyroid normal size, non-tender, without nodularity LYMPH:  no palpable lymphadenopathy in the cervical, axillary or inguinal LUNGS: clear to auscultation and percussion with normal breathing effort HEART: regular rate & rhythm and no murmurs and no lower extremity edema ABDOMEN:abdomen soft, non-tender and normal bowel sounds Musculoskeletal:no cyanosis of digits and no clubbing  NEURO: alert & oriented x 3 with fluent speech, no focal  motor/sensory deficits  LABORATORY DATA:  I have reviewed the data as listed    Component Value Date/Time   NA 143 05/08/2018 1143   NA 141 08/23/2017 1026   K 3.7 05/08/2018 1143   K 3.7 08/23/2017 1026   CL 101 05/08/2018 1143   CL 98 02/02/2013 1353   CO2 31 05/08/2018 1143   CO2 33 (H) 08/23/2017 1026   GLUCOSE 108 (H) 05/08/2018 1143   GLUCOSE 92 08/23/2017 1026   GLUCOSE 99 02/02/2013 1353   BUN 19 05/08/2018 1143   BUN 21.0 08/23/2017 1026   CREATININE 1.05 (H) 05/08/2018 1143   CREATININE 1.2 (H) 08/23/2017 1026   CALCIUM 9.9 05/08/2018 1143   CALCIUM 9.3 08/23/2017 1026   PROT 6.1 (L) 05/08/2018 1143   PROT 6.3 (L) 08/23/2017 1026   ALBUMIN 4.0 05/08/2018 1143   ALBUMIN 3.8 08/23/2017 1026   AST 21 05/08/2018 1143   AST 11 08/23/2017 1026   ALT 29 05/08/2018 1143   ALT 10 08/23/2017 1026   ALKPHOS 68 05/08/2018 1143   ALKPHOS 66 08/23/2017 1026   BILITOT 0.4 05/08/2018 1143   BILITOT 0.59 08/23/2017 1026   GFRNONAA 52 (L) 05/08/2018 1143   GFRAA 60 (L) 05/08/2018 1143    No results found for: SPEP, UPEP  Lab Results  Component Value Date   WBC 6.8 05/08/2018   NEUTROABS 5.3 05/08/2018   HGB 12.0 05/08/2018   HCT 36.6 05/08/2018   MCV 83.6 05/08/2018   PLT 43 (L) 05/08/2018      Chemistry      Component Value Date/Time   NA 143 05/08/2018 1143   NA 141 08/23/2017 1026   K 3.7 05/08/2018 1143   K 3.7 08/23/2017 1026   CL 101 05/08/2018 1143   CL 98 02/02/2013 1353   CO2 31 05/08/2018 1143   CO2 33 (H) 08/23/2017 1026   BUN 19 05/08/2018 1143   BUN 21.0 08/23/2017 1026   CREATININE 1.05 (H) 05/08/2018 1143   CREATININE 1.2 (H) 08/23/2017 1026      Component Value Date/Time   CALCIUM 9.9 05/08/2018 1143   CALCIUM 9.3 08/23/2017 1026   ALKPHOS 68 05/08/2018 1143   ALKPHOS 66 08/23/2017 1026   AST 21 05/08/2018 1143   AST 11 08/23/2017 1026   ALT 29 05/08/2018 1143   ALT 10 08/23/2017 1026   BILITOT 0.4 05/08/2018 1143   BILITOT 0.59  08/23/2017 1026       RADIOGRAPHIC STUDIES: I have reviewed multiple imaging studies with patient and family I have personally reviewed the radiological images as listed and agreed with the findings in the report. Ct Chest W Contrast  Result Date: 05/08/2018 CLINICAL DATA:  CLL/SLL, ongoing chemotherapy, restaging. EXAM: CT CHEST, ABDOMEN, AND PELVIS WITH CONTRAST  TECHNIQUE: Multidetector CT imaging of the chest, abdomen and pelvis was performed following the standard protocol during bolus administration of intravenous contrast. CONTRAST:  126m ISOVUE-300 IOPAMIDOL (ISOVUE-300) INJECTION 61% COMPARISON:  12/29/2017 CT chest, abdomen and pelvis. FINDINGS: CT CHEST FINDINGS Cardiovascular: Stable top-normal heart size. No significant pericardial effusion/thickening. Left anterior descending and right coronary atherosclerosis. Right internal jugular MediPort terminates in the right atrium. Atherosclerotic nonaneurysmal thoracic aorta. Stable top-normal caliber main pulmonary artery (3.1 cm diameter). No central pulmonary emboli. Mediastinum/Nodes: No discrete thyroid nodules. Unremarkable esophagus. Axillary nodes have decreased in size. Previously described 1.3 cm right axillary node is decreased to 1.0 cm (series 2/image 7). Previously described 1.0 cm left axillary node is decreased to 0.8 cm (series 2/image 17). Mildly enlarged 1.2 cm subcarinal node (series 2/image 25), decreased from 1.4 cm. Previously described enlarged 1.4 cm right infrahilar node measures 0.8 cm, decreased (series 2/image 27). No pathologically enlarged hilar nodes. Lungs/Pleura: No pneumothorax. No pleural effusion. No acute consolidative airspace disease or lung masses. Anterior right upper lobe 4 mm solid pulmonary nodule (series 6/image 49) is stable since at least 06/14/2016 CT, considered benign. No new significant pulmonary nodules. Nonspecific patchy subpleural reticulation in the dependent lungs is nonspecific and not  appreciably changed. Musculoskeletal: No aggressive appearing focal osseous lesions. Moderate thoracic spondylosis. CT ABDOMEN PELVIS FINDINGS Hepatobiliary: Normal liver with no liver mass. Cholecystectomy. No biliary ductal dilatation. Pancreas: Normal, with no mass or duct dilation. Spleen: Mild splenomegaly (craniocaudal splenic length 15.2 cm, decreased from 17.5 cm). No discrete splenic mass. Adrenals/Urinary Tract: Stable adrenals without discrete adrenal nodules. Normal kidneys with no hydronephrosis and no renal mass. Normal bladder. Stomach/Bowel: Small hiatal hernia. Otherwise normal nondistended stomach. Normal caliber small bowel with no small bowel wall thickening. Normal appendix. Oral contrast transits to the left colon. Normal large bowel with no diverticulosis, large bowel wall thickening or pericolonic fat stranding. Vascular/Lymphatic: Atherosclerotic nonaneurysmal abdominal aorta. Patent portal, splenic, hepatic and renal veins. Peripancreatic adenopathy measuring up to 1.6 cm (series 2/image 54), decreased from 2.3 cm. Mildly enlarged 1.3 cm portacaval node (series 2/image 59), decreased from 2.0 cm. Mildly enlarged 1.2 cm posterior paracaval node (series 2/image 67), decreased from 1.6 cm. Decreased left para-aortic adenopathy with representative 1.8 cm left para-aortic node (series 2/image 73), decreased from 2.3 cm. Bilateral common iliac adenopathy is decreased, with representative 2.4 cm right common iliac node (series 2/image 90), decreased from 3.1 cm. Bilateral external iliac adenopathy CT decreased, with representative 2.1 cm left external iliac node (series 2/image 108), decreased from 3.0 cm. Reproductive: Status post hysterectomy, with no abnormal findings at the vaginal cuff. No adnexal mass. Other: No pneumoperitoneum, ascites or focal fluid collection. Stable small fat containing umbilical hernia. Musculoskeletal: No aggressive appearing focal osseous lesions. Mild lumbar  spondylosis. IMPRESSION: 1. Interval partial treatment response. Bilateral axillary, mediastinal, right hilar, retroperitoneal and bilateral pelvic adenopathy is all decreased. Mild splenomegaly, decreased. No new or progressive disease. 2.  Aortic Atherosclerosis (ICD10-I70.0). Electronically Signed   By: JIlona SorrelM.D.   On: 05/08/2018 16:33   Ct Abdomen Pelvis W Contrast  Result Date: 05/08/2018 CLINICAL DATA:  CLL/SLL, ongoing chemotherapy, restaging. EXAM: CT CHEST, ABDOMEN, AND PELVIS WITH CONTRAST TECHNIQUE: Multidetector CT imaging of the chest, abdomen and pelvis was performed following the standard protocol during bolus administration of intravenous contrast. CONTRAST:  1059mISOVUE-300 IOPAMIDOL (ISOVUE-300) INJECTION 61% COMPARISON:  12/29/2017 CT chest, abdomen and pelvis. FINDINGS: CT CHEST FINDINGS Cardiovascular: Stable top-normal heart size. No significant pericardial  effusion/thickening. Left anterior descending and right coronary atherosclerosis. Right internal jugular MediPort terminates in the right atrium. Atherosclerotic nonaneurysmal thoracic aorta. Stable top-normal caliber main pulmonary artery (3.1 cm diameter). No central pulmonary emboli. Mediastinum/Nodes: No discrete thyroid nodules. Unremarkable esophagus. Axillary nodes have decreased in size. Previously described 1.3 cm right axillary node is decreased to 1.0 cm (series 2/image 7). Previously described 1.0 cm left axillary node is decreased to 0.8 cm (series 2/image 17). Mildly enlarged 1.2 cm subcarinal node (series 2/image 25), decreased from 1.4 cm. Previously described enlarged 1.4 cm right infrahilar node measures 0.8 cm, decreased (series 2/image 27). No pathologically enlarged hilar nodes. Lungs/Pleura: No pneumothorax. No pleural effusion. No acute consolidative airspace disease or lung masses. Anterior right upper lobe 4 mm solid pulmonary nodule (series 6/image 49) is stable since at least 06/14/2016 CT, considered  benign. No new significant pulmonary nodules. Nonspecific patchy subpleural reticulation in the dependent lungs is nonspecific and not appreciably changed. Musculoskeletal: No aggressive appearing focal osseous lesions. Moderate thoracic spondylosis. CT ABDOMEN PELVIS FINDINGS Hepatobiliary: Normal liver with no liver mass. Cholecystectomy. No biliary ductal dilatation. Pancreas: Normal, with no mass or duct dilation. Spleen: Mild splenomegaly (craniocaudal splenic length 15.2 cm, decreased from 17.5 cm). No discrete splenic mass. Adrenals/Urinary Tract: Stable adrenals without discrete adrenal nodules. Normal kidneys with no hydronephrosis and no renal mass. Normal bladder. Stomach/Bowel: Small hiatal hernia. Otherwise normal nondistended stomach. Normal caliber small bowel with no small bowel wall thickening. Normal appendix. Oral contrast transits to the left colon. Normal large bowel with no diverticulosis, large bowel wall thickening or pericolonic fat stranding. Vascular/Lymphatic: Atherosclerotic nonaneurysmal abdominal aorta. Patent portal, splenic, hepatic and renal veins. Peripancreatic adenopathy measuring up to 1.6 cm (series 2/image 54), decreased from 2.3 cm. Mildly enlarged 1.3 cm portacaval node (series 2/image 59), decreased from 2.0 cm. Mildly enlarged 1.2 cm posterior paracaval node (series 2/image 67), decreased from 1.6 cm. Decreased left para-aortic adenopathy with representative 1.8 cm left para-aortic node (series 2/image 73), decreased from 2.3 cm. Bilateral common iliac adenopathy is decreased, with representative 2.4 cm right common iliac node (series 2/image 90), decreased from 3.1 cm. Bilateral external iliac adenopathy CT decreased, with representative 2.1 cm left external iliac node (series 2/image 108), decreased from 3.0 cm. Reproductive: Status post hysterectomy, with no abnormal findings at the vaginal cuff. No adnexal mass. Other: No pneumoperitoneum, ascites or focal fluid  collection. Stable small fat containing umbilical hernia. Musculoskeletal: No aggressive appearing focal osseous lesions. Mild lumbar spondylosis. IMPRESSION: 1. Interval partial treatment response. Bilateral axillary, mediastinal, right hilar, retroperitoneal and bilateral pelvic adenopathy is all decreased. Mild splenomegaly, decreased. No new or progressive disease. 2.  Aortic Atherosclerosis (ICD10-I70.0). Electronically Signed   By: Ilona Sorrel M.D.   On: 05/08/2018 16:33    All questions were answered. The patient knows to call the clinic with any problems, questions or concerns. No barriers to learning was detected.  I spent 25 minutes counseling the patient face to face. The total time spent in the appointment was 30 minutes and more than 50% was on counseling and review of test results  Heath Lark, MD 05/10/2018 9:52 AM

## 2018-05-10 NOTE — Telephone Encounter (Signed)
Per 7/24 los gave avs and calender however had to decouple

## 2018-05-10 NOTE — Assessment & Plan Note (Signed)
This is likely due to splenic sequestration. The patient denies recent history of bleeding such as epistaxis, hematuria or hematochezia. She is asymptomatic from the low platelet count.  We will proceed with treatment regardless of platelet count

## 2018-05-10 NOTE — Assessment & Plan Note (Signed)
The patient has element of whitecoat hypertension She will continue close monitoring of blood pressure at home 

## 2018-05-11 DIAGNOSIS — R296 Repeated falls: Secondary | ICD-10-CM | POA: Diagnosis not present

## 2018-05-11 DIAGNOSIS — M6281 Muscle weakness (generalized): Secondary | ICD-10-CM | POA: Diagnosis not present

## 2018-05-11 DIAGNOSIS — R2681 Unsteadiness on feet: Secondary | ICD-10-CM | POA: Diagnosis not present

## 2018-05-11 DIAGNOSIS — R2689 Other abnormalities of gait and mobility: Secondary | ICD-10-CM | POA: Diagnosis not present

## 2018-05-12 DIAGNOSIS — R2681 Unsteadiness on feet: Secondary | ICD-10-CM | POA: Diagnosis not present

## 2018-05-12 DIAGNOSIS — M6281 Muscle weakness (generalized): Secondary | ICD-10-CM | POA: Diagnosis not present

## 2018-05-12 DIAGNOSIS — R2689 Other abnormalities of gait and mobility: Secondary | ICD-10-CM | POA: Diagnosis not present

## 2018-05-12 DIAGNOSIS — R296 Repeated falls: Secondary | ICD-10-CM | POA: Diagnosis not present

## 2018-05-15 DIAGNOSIS — R2689 Other abnormalities of gait and mobility: Secondary | ICD-10-CM | POA: Diagnosis not present

## 2018-05-15 DIAGNOSIS — R2681 Unsteadiness on feet: Secondary | ICD-10-CM | POA: Diagnosis not present

## 2018-05-15 DIAGNOSIS — M6281 Muscle weakness (generalized): Secondary | ICD-10-CM | POA: Diagnosis not present

## 2018-05-15 DIAGNOSIS — R296 Repeated falls: Secondary | ICD-10-CM | POA: Diagnosis not present

## 2018-05-16 DIAGNOSIS — M6281 Muscle weakness (generalized): Secondary | ICD-10-CM | POA: Diagnosis not present

## 2018-05-16 DIAGNOSIS — R2689 Other abnormalities of gait and mobility: Secondary | ICD-10-CM | POA: Diagnosis not present

## 2018-05-16 DIAGNOSIS — R296 Repeated falls: Secondary | ICD-10-CM | POA: Diagnosis not present

## 2018-05-16 DIAGNOSIS — R2681 Unsteadiness on feet: Secondary | ICD-10-CM | POA: Diagnosis not present

## 2018-05-18 DIAGNOSIS — R2689 Other abnormalities of gait and mobility: Secondary | ICD-10-CM | POA: Diagnosis not present

## 2018-05-18 DIAGNOSIS — R296 Repeated falls: Secondary | ICD-10-CM | POA: Diagnosis not present

## 2018-05-18 DIAGNOSIS — M6281 Muscle weakness (generalized): Secondary | ICD-10-CM | POA: Diagnosis not present

## 2018-05-18 DIAGNOSIS — R2681 Unsteadiness on feet: Secondary | ICD-10-CM | POA: Diagnosis not present

## 2018-05-22 ENCOUNTER — Other Ambulatory Visit: Payer: Self-pay | Admitting: Hematology and Oncology

## 2018-05-22 DIAGNOSIS — R2681 Unsteadiness on feet: Secondary | ICD-10-CM | POA: Diagnosis not present

## 2018-05-22 DIAGNOSIS — R2689 Other abnormalities of gait and mobility: Secondary | ICD-10-CM | POA: Diagnosis not present

## 2018-05-22 DIAGNOSIS — Z1231 Encounter for screening mammogram for malignant neoplasm of breast: Secondary | ICD-10-CM

## 2018-05-22 DIAGNOSIS — R296 Repeated falls: Secondary | ICD-10-CM | POA: Diagnosis not present

## 2018-05-22 DIAGNOSIS — M6281 Muscle weakness (generalized): Secondary | ICD-10-CM | POA: Diagnosis not present

## 2018-05-24 DIAGNOSIS — R296 Repeated falls: Secondary | ICD-10-CM | POA: Diagnosis not present

## 2018-05-24 DIAGNOSIS — R2689 Other abnormalities of gait and mobility: Secondary | ICD-10-CM | POA: Diagnosis not present

## 2018-05-24 DIAGNOSIS — R2681 Unsteadiness on feet: Secondary | ICD-10-CM | POA: Diagnosis not present

## 2018-05-24 DIAGNOSIS — M6281 Muscle weakness (generalized): Secondary | ICD-10-CM | POA: Diagnosis not present

## 2018-05-25 DIAGNOSIS — R296 Repeated falls: Secondary | ICD-10-CM | POA: Diagnosis not present

## 2018-05-25 DIAGNOSIS — R2689 Other abnormalities of gait and mobility: Secondary | ICD-10-CM | POA: Diagnosis not present

## 2018-05-25 DIAGNOSIS — M6281 Muscle weakness (generalized): Secondary | ICD-10-CM | POA: Diagnosis not present

## 2018-05-25 DIAGNOSIS — R2681 Unsteadiness on feet: Secondary | ICD-10-CM | POA: Diagnosis not present

## 2018-05-29 ENCOUNTER — Other Ambulatory Visit: Payer: Medicare Other

## 2018-05-29 DIAGNOSIS — R296 Repeated falls: Secondary | ICD-10-CM | POA: Diagnosis not present

## 2018-05-29 DIAGNOSIS — M6281 Muscle weakness (generalized): Secondary | ICD-10-CM | POA: Diagnosis not present

## 2018-05-29 DIAGNOSIS — R2681 Unsteadiness on feet: Secondary | ICD-10-CM | POA: Diagnosis not present

## 2018-05-29 DIAGNOSIS — R2689 Other abnormalities of gait and mobility: Secondary | ICD-10-CM | POA: Diagnosis not present

## 2018-05-30 ENCOUNTER — Inpatient Hospital Stay: Payer: Medicare Other

## 2018-05-30 ENCOUNTER — Inpatient Hospital Stay: Payer: Medicare Other | Attending: Hematology and Oncology

## 2018-05-30 VITALS — BP 163/73 | HR 72 | Temp 98.4°F | Resp 18

## 2018-05-30 DIAGNOSIS — C9110 Chronic lymphocytic leukemia of B-cell type not having achieved remission: Secondary | ICD-10-CM

## 2018-05-30 DIAGNOSIS — Z5112 Encounter for antineoplastic immunotherapy: Secondary | ICD-10-CM | POA: Diagnosis not present

## 2018-05-30 LAB — CBC WITH DIFFERENTIAL (CANCER CENTER ONLY)
Basophils Absolute: 0.1 10*3/uL (ref 0.0–0.1)
Basophils Relative: 1 %
Eosinophils Absolute: 0.2 10*3/uL (ref 0.0–0.5)
Eosinophils Relative: 4 %
HCT: 37.6 % (ref 34.8–46.6)
Hemoglobin: 12.5 g/dL (ref 11.6–15.9)
Lymphocytes Relative: 6 %
Lymphs Abs: 0.4 10*3/uL — ABNORMAL LOW (ref 0.9–3.3)
MCH: 27.8 pg (ref 25.1–34.0)
MCHC: 33.3 g/dL (ref 31.5–36.0)
MCV: 83.4 fL (ref 79.5–101.0)
Monocytes Absolute: 0.7 10*3/uL (ref 0.1–0.9)
Monocytes Relative: 11 %
Neutro Abs: 5.2 10*3/uL (ref 1.5–6.5)
Neutrophils Relative %: 78 %
Platelet Count: 71 10*3/uL — ABNORMAL LOW (ref 145–400)
RBC: 4.51 MIL/uL (ref 3.70–5.45)
RDW: 15.7 % — ABNORMAL HIGH (ref 11.2–14.5)
WBC Count: 6.5 10*3/uL (ref 3.9–10.3)

## 2018-05-30 LAB — CMP (CANCER CENTER ONLY)
ALT: 27 U/L (ref 0–44)
AST: 19 U/L (ref 15–41)
Albumin: 3.8 g/dL (ref 3.5–5.0)
Alkaline Phosphatase: 67 U/L (ref 38–126)
Anion gap: 13 (ref 5–15)
BUN: 20 mg/dL (ref 8–23)
CO2: 28 mmol/L (ref 22–32)
Calcium: 9.5 mg/dL (ref 8.9–10.3)
Chloride: 103 mmol/L (ref 98–111)
Creatinine: 1.28 mg/dL — ABNORMAL HIGH (ref 0.44–1.00)
GFR, Est AFR Am: 47 mL/min — ABNORMAL LOW (ref >60)
GFR, Estimated: 41 mL/min — ABNORMAL LOW (ref >60)
Glucose, Bld: 85 mg/dL (ref 70–99)
Potassium: 3.5 mmol/L (ref 3.5–5.1)
Sodium: 144 mmol/L (ref 135–145)
Total Bilirubin: 0.4 mg/dL (ref 0.3–1.2)
Total Protein: 6.1 g/dL — ABNORMAL LOW (ref 6.5–8.1)

## 2018-05-30 LAB — URIC ACID: Uric Acid, Serum: 5.4 mg/dL (ref 2.5–7.1)

## 2018-05-30 MED ORDER — SODIUM CHLORIDE 0.9 % IV SOLN
1000.0000 mg | Freq: Once | INTRAVENOUS | Status: AC
  Administered 2018-05-30: 1000 mg via INTRAVENOUS
  Filled 2018-05-30: qty 40

## 2018-05-30 MED ORDER — DIPHENHYDRAMINE HCL 50 MG/ML IJ SOLN
INTRAMUSCULAR | Status: AC
  Filled 2018-05-30: qty 1

## 2018-05-30 MED ORDER — SODIUM CHLORIDE 0.9 % IV SOLN
Freq: Once | INTRAVENOUS | Status: AC
  Administered 2018-05-30: 10:00:00 via INTRAVENOUS
  Filled 2018-05-30: qty 250

## 2018-05-30 MED ORDER — ACETAMINOPHEN 325 MG PO TABS
650.0000 mg | ORAL_TABLET | Freq: Once | ORAL | Status: AC
  Administered 2018-05-30: 650 mg via ORAL

## 2018-05-30 MED ORDER — SODIUM CHLORIDE 0.9% FLUSH
10.0000 mL | INTRAVENOUS | Status: DC | PRN
  Administered 2018-05-30: 10 mL
  Filled 2018-05-30: qty 10

## 2018-05-30 MED ORDER — ACETAMINOPHEN 325 MG PO TABS
ORAL_TABLET | ORAL | Status: AC
  Filled 2018-05-30: qty 2

## 2018-05-30 MED ORDER — DEXAMETHASONE SODIUM PHOSPHATE 10 MG/ML IJ SOLN
10.0000 mg | Freq: Once | INTRAMUSCULAR | Status: AC
  Administered 2018-05-30: 10 mg via INTRAVENOUS

## 2018-05-30 MED ORDER — HEPARIN SOD (PORK) LOCK FLUSH 100 UNIT/ML IV SOLN
500.0000 [IU] | Freq: Once | INTRAVENOUS | Status: AC | PRN
  Administered 2018-05-30: 500 [IU]
  Filled 2018-05-30: qty 5

## 2018-05-30 MED ORDER — DEXAMETHASONE SODIUM PHOSPHATE 10 MG/ML IJ SOLN
INTRAMUSCULAR | Status: AC
  Filled 2018-05-30: qty 1

## 2018-05-30 MED ORDER — DIPHENHYDRAMINE HCL 50 MG/ML IJ SOLN
50.0000 mg | Freq: Once | INTRAMUSCULAR | Status: AC
  Administered 2018-05-30: 50 mg via INTRAVENOUS

## 2018-05-30 NOTE — Patient Instructions (Signed)
[facility] Discharge Instructions for Patients Receiving Chemotherapy  Today you received the following chemotherapy agents: Gazyva   To help prevent nausea and vomiting after your treatment, we encourage you to take your nausea medication as directed.    If you develop nausea and vomiting that is not controlled by your nausea medication, call the clinic.   BELOW ARE SYMPTOMS THAT SHOULD BE REPORTED IMMEDIATELY:  *FEVER GREATER THAN 100.5 F  *CHILLS WITH OR WITHOUT FEVER  NAUSEA AND VOMITING THAT IS NOT CONTROLLED WITH YOUR NAUSEA MEDICATION  *UNUSUAL SHORTNESS OF BREATH  *UNUSUAL BRUISING OR BLEEDING  TENDERNESS IN MOUTH AND THROAT WITH OR WITHOUT PRESENCE OF ULCERS  *URINARY PROBLEMS  *BOWEL PROBLEMS  UNUSUAL RASH Items with * indicate a potential emergency and should be followed up as soon as possible.  Feel free to call the clinic should you have any questions or concerns. The clinic phone number is [phone].  Please show the CHEMO ALERT CARD at check-in to the Emergency Department and triage nurse.   

## 2018-06-01 DIAGNOSIS — M6281 Muscle weakness (generalized): Secondary | ICD-10-CM | POA: Diagnosis not present

## 2018-06-01 DIAGNOSIS — R296 Repeated falls: Secondary | ICD-10-CM | POA: Diagnosis not present

## 2018-06-01 DIAGNOSIS — R2689 Other abnormalities of gait and mobility: Secondary | ICD-10-CM | POA: Diagnosis not present

## 2018-06-01 DIAGNOSIS — R2681 Unsteadiness on feet: Secondary | ICD-10-CM | POA: Diagnosis not present

## 2018-06-21 ENCOUNTER — Ambulatory Visit
Admission: RE | Admit: 2018-06-21 | Discharge: 2018-06-21 | Disposition: A | Discharge status: Home / Self Care | Payer: Medicare Other | Source: Ambulatory Visit | Attending: Hematology and Oncology | Admitting: Hematology and Oncology

## 2018-06-21 DIAGNOSIS — Z1231 Encounter for screening mammogram for malignant neoplasm of breast: Secondary | ICD-10-CM

## 2018-06-26 ENCOUNTER — Encounter: Payer: Self-pay | Admitting: Hematology and Oncology

## 2018-06-26 ENCOUNTER — Telehealth: Payer: Self-pay | Admitting: Hematology and Oncology

## 2018-06-26 ENCOUNTER — Inpatient Hospital Stay: Payer: Medicare Other | Attending: Hematology and Oncology

## 2018-06-26 ENCOUNTER — Inpatient Hospital Stay (HOSPITAL_BASED_OUTPATIENT_CLINIC_OR_DEPARTMENT_OTHER): Payer: Medicare Other | Admitting: Hematology and Oncology

## 2018-06-26 ENCOUNTER — Inpatient Hospital Stay: Payer: Medicare Other

## 2018-06-26 VITALS — BP 156/70 | HR 72 | Temp 98.2°F | Resp 18 | Ht 63.0 in | Wt 298.4 lb

## 2018-06-26 DIAGNOSIS — C9110 Chronic lymphocytic leukemia of B-cell type not having achieved remission: Secondary | ICD-10-CM

## 2018-06-26 DIAGNOSIS — I10 Essential (primary) hypertension: Secondary | ICD-10-CM

## 2018-06-26 DIAGNOSIS — Z79899 Other long term (current) drug therapy: Secondary | ICD-10-CM | POA: Insufficient documentation

## 2018-06-26 DIAGNOSIS — D696 Thrombocytopenia, unspecified: Secondary | ICD-10-CM

## 2018-06-26 LAB — CBC WITH DIFFERENTIAL (CANCER CENTER ONLY)
Basophils Absolute: 0.1 10*3/uL (ref 0.0–0.1)
Basophils Relative: 1 %
Eosinophils Absolute: 0.2 10*3/uL (ref 0.0–0.5)
Eosinophils Relative: 3 %
HCT: 37.8 % (ref 34.8–46.6)
Hemoglobin: 12.5 g/dL (ref 11.6–15.9)
Lymphocytes Relative: 7 %
Lymphs Abs: 0.5 10*3/uL — ABNORMAL LOW (ref 0.9–3.3)
MCH: 28 pg (ref 25.1–34.0)
MCHC: 33.1 g/dL (ref 31.5–36.0)
MCV: 84.8 fL (ref 79.5–101.0)
Monocytes Absolute: 1 10*3/uL — ABNORMAL HIGH (ref 0.1–0.9)
Monocytes Relative: 13 %
Neutro Abs: 5.6 10*3/uL (ref 1.5–6.5)
Neutrophils Relative %: 76 %
Platelet Count: 47 10*3/uL — ABNORMAL LOW (ref 145–400)
RBC: 4.46 MIL/uL (ref 3.70–5.45)
RDW: 14.2 % (ref 11.2–14.5)
WBC Count: 7.4 10*3/uL (ref 3.9–10.3)

## 2018-06-26 LAB — CMP (CANCER CENTER ONLY)
ALT: 25 U/L (ref 0–44)
AST: 20 U/L (ref 15–41)
Albumin: 3.9 g/dL (ref 3.5–5.0)
Alkaline Phosphatase: 79 U/L (ref 38–126)
Anion gap: 12 (ref 5–15)
BUN: 22 mg/dL (ref 8–23)
CO2: 27 mmol/L (ref 22–32)
Calcium: 9.5 mg/dL (ref 8.9–10.3)
Chloride: 102 mmol/L (ref 98–111)
Creatinine: 1.05 mg/dL — ABNORMAL HIGH (ref 0.44–1.00)
GFR, Est AFR Am: 60 mL/min — ABNORMAL LOW (ref >60)
GFR, Estimated: 52 mL/min — ABNORMAL LOW (ref >60)
Glucose, Bld: 97 mg/dL (ref 70–99)
Potassium: 3.8 mmol/L (ref 3.5–5.1)
Sodium: 141 mmol/L (ref 135–145)
Total Bilirubin: 0.4 mg/dL (ref 0.3–1.2)
Total Protein: 6.1 g/dL — ABNORMAL LOW (ref 6.5–8.1)

## 2018-06-26 MED ORDER — SODIUM CHLORIDE 0.9% FLUSH
10.0000 mL | Freq: Once | INTRAVENOUS | Status: AC
  Administered 2018-06-26: 10 mL
  Filled 2018-06-26: qty 10

## 2018-06-26 MED ORDER — HEPARIN SOD (PORK) LOCK FLUSH 100 UNIT/ML IV SOLN
500.0000 [IU] | Freq: Once | INTRAVENOUS | Status: AC
  Administered 2018-06-26: 500 [IU]
  Filled 2018-06-26: qty 5

## 2018-06-26 NOTE — Assessment & Plan Note (Signed)
She has achieved hematological response Thrombocytopenia is improving slowly Based on imaging studies, the cause of her thrombocytopenia is likely due to sequestration from splenomegaly Due to severe thrombocytopenia, I plan to hold her treatment today We will reschedule in a month due to previous noted prolonged thrombocytopenia I plan minimum 6 cycles of treatment before repeat another imaging study, due around end of the year

## 2018-06-26 NOTE — Assessment & Plan Note (Signed)
The patient has element of whitecoat hypertension She will continue close monitoring of blood pressure at home 

## 2018-06-26 NOTE — Assessment & Plan Note (Signed)
She has profound thrombocytopenia due to side effects of treatment Thankfully, she is not symptomatic I plan to hold her treatment today and reschedule in 1 month

## 2018-06-26 NOTE — Progress Notes (Signed)
[facility] OFFICE PROGRESS NOTE  Patient Care Team: Jonathon Jordan, MD as PCP - General (Family Medicine)  ASSESSMENT & PLAN:  CLL (chronic lymphocytic leukemia) ([place]) She has achieved hematological response Thrombocytopenia is improving slowly Based on imaging studies, the cause of her thrombocytopenia is likely due to sequestration from splenomegaly Due to severe thrombocytopenia, I plan to hold her treatment today We will reschedule in a month due to previous noted prolonged thrombocytopenia I plan minimum 6 cycles of treatment before repeat another imaging study, due around end of the year  Thrombocytopenia, unspecified ([place]) She has profound thrombocytopenia due to side effects of treatment Thankfully, she is not symptomatic I plan to hold her treatment today and reschedule in 1 month  Essential hypertension The patient has element of whitecoat hypertension She will continue close monitoring of blood pressure at home   No orders of the defined types were placed in this encounter.   INTERVAL HISTORY: Please see below for problem oriented charting. She returns with her knees for further follow-up and chemotherapy Since last time I saw her, she has gained some weight She denies headaches from high blood pressure The patient denies any recent signs or symptoms of bleeding such as spontaneous epistaxis, hematuria or hematochezia. She is active but denies recent falls. No recent infection, fever or chills. Denies recent infusion reaction  SUMMARY OF ONCOLOGIC HISTORY: Oncology History   FISH del 13 q     CLL (chronic lymphocytic leukemia) ([place])   07/02/2010 Procedure    LN biopsy confirmed CLL    08/18/2010 Procedure    Bone marrow biopsy confirmed CLL    11/18/2010 - 04/15/2011 Chemotherapy    patient received 6 cycles of FLudarabine and Rituximab    01/13/2012 Relapse/Recurrence    Repeat BM biopsy confirmed relapse    01/27/2012 - 06/23/2012  Chemotherapy    Bendamustine & Rituximab given, complicated by infusion reaction to Rituximab, completed 6 cycles    06/29/2013 Imaging    Ct scan confirmed disease relapse with bulky lymphadenopathy    07/23/2013 Procedure    Repeat BM biopsy due to thrombocytopenia and progressive leukocytosis    08/02/2013 Relapse/Recurrence    Patient consented to start iburitinib as salvage therapy for relapsed CLL    11/09/2013 Imaging    Repeat CT scan the chest, abdomen and pelvis showed greater than 50% response to treatment    06/17/2014 Imaging    Repeat CT scan showed near complete response to treatment.    05/30/2015 Imaging    Repeat CT scan showed near complete response to treatment.    06/14/2016 Imaging    CT scan showed stable scattered small retroperitoneal lymph nodes and pelvic lymph nodes. No findings for recurrent or progressive lymphoma. Stable mild splenomegaly.    12/30/2017 Imaging    1. Recurrent adenopathy within the chest, abdomen, and pelvis. 2. Splenomegaly. 3. Aortic Atherosclerosis (ICD10-I70.0). 4. Coronary artery calcifications.    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 The catheter is ready for immediate use.    01/24/2018 -  Chemotherapy    The patient had Gazyva and 1 dose of bendamustine. Bendamustine was discontinued due to inability to get insurance approval    05/08/2018 Imaging    1. Interval partial treatment response. Bilateral axillary, mediastinal, right hilar, retroperitoneal and bilateral pelvic adenopathy is  all decreased. Mild splenomegaly, decreased. No new or progressive disease. 2. Aortic Atherosclerosis (ICD10-I70.0).     REVIEW OF SYSTEMS:   Constitutional: Denies fevers, chills or abnormal weight loss Eyes: Denies blurriness of vision Ears, nose, mouth, throat, and face: Denies  mucositis or sore throat Respiratory: Denies cough, dyspnea or wheezes Cardiovascular: Denies palpitation, chest discomfort or lower extremity swelling Gastrointestinal:  Denies nausea, heartburn or change in bowel habits Skin: Denies abnormal skin rashes Lymphatics: Denies new lymphadenopathy or easy bruising Neurological:Denies numbness, tingling or new weaknesses Behavioral/Psych: Mood is stable, no new changes  All other systems were reviewed with the patient and are negative.  I have reviewed the past medical history, past surgical history, social history and family history with the patient and they are unchanged from previous note.  ALLERGIES:  is allergic to azithromycin and ibuprofen [ibuprofen].  MEDICATIONS:  Current Outpatient Medications  Medication Sig Dispense Refill  . acyclovir (ZOVIRAX) 400 MG tablet Take 1 tablet (400 mg total) by mouth daily. 30 tablet 5  . allopurinol (ZYLOPRIM) 300 MG tablet Take 1 tablet (300 mg total) by mouth daily. 30 tablet 2  . amLODipine (NORVASC) 10 MG tablet TAKE 1 TABLET (10 MG TOTAL) BY MOUTH DAILY. 90 tablet 1  . bisoprolol (ZEBETA) 5 MG tablet   10  . Carboxymethylcellul-Glycerin (REFRESH OPTIVE SENSITIVE OP) Place 1 drop into both eyes as needed.     . citalopram (CELEXA) 20 MG tablet Take 20 mg by mouth every morning.     . lidocaine-prilocaine (EMLA) cream Apply to affected area once 30 g 3  . loratadine (CLARITIN) 10 MG tablet Take 10 mg daily by mouth.    . losartan-hydrochlorothiazide (HYZAAR) 100-25 MG tablet   9  . Multiple Vitamins-Minerals (PRESERVISION AREDS 2) CAPS Take 1 capsule by mouth 2 (two) times daily.    . ondansetron (ZOFRAN) 8 MG tablet Take 1 tablet (8 mg total) by mouth every 8 (eight) hours as needed. Then take as needed for nausea or vomiting. 30 tablet 1  . prochlorperazine (COMPAZINE) 10 MG tablet Take 1 tablet (10 mg total) by mouth every 6 (six) hours as needed (Nausea or vomiting). 30 tablet 1  .  pseudoephedrine-guaifenesin (MUCINEX D) 60-600 MG 12 hr tablet Take 1 tablet by mouth every 12 (twelve) hours.    Marland Kitchen tiZANidine (ZANAFLEX) 2 MG tablet Take by mouth every 6 (six) hours as needed for muscle spasms.    Marland Kitchen triamcinolone (NASACORT) 55 MCG/ACT AERO nasal inhaler Place 2 sprays into the nose daily. 1 Inhaler 12  . Vitamin D, Ergocalciferol, 2000 units CAPS Take by mouth.     No current facility-administered medications for this visit.     PHYSICAL EXAMINATION: ECOG PERFORMANCE STATUS: 1 - Symptomatic but completely ambulatory  Vitals:   06/26/18 0847  BP: (!) 156/70  Pulse: 72  Resp: 18  Temp: 98.2 F (36.8 C)  SpO2: 98%   Filed Weights   06/26/18 0847  Weight: 298 lb 6.4 oz (135.4 kg)    GENERAL:alert, no distress and comfortable SKIN: skin color, texture, turgor are normal, no rashes or significant lesions EYES: normal, Conjunctiva are pink and non-injected, sclera clear OROPHARYNX:no exudate, no erythema and lips, buccal mucosa, and tongue normal  NECK: supple, thyroid normal size, non-tender, without nodularity LYMPH:  no palpable lymphadenopathy in the cervical, axillary or inguinal LUNGS: clear to auscultation and percussion with normal breathing effort HEART: regular rate & rhythm and no murmurs and no lower extremity edema ABDOMEN:abdomen soft,  non-tender and normal bowel sounds Musculoskeletal:no cyanosis of digits and no clubbing  NEURO: alert & oriented x 3 with fluent speech, no focal motor/sensory deficits  LABORATORY DATA:  I have reviewed the data as listed    Component Value Date/Time   NA 141 06/26/2018 0752   NA 141 08/23/2017 1026   K 3.8 06/26/2018 0752   K 3.7 08/23/2017 1026   CL 102 06/26/2018 0752   CL 98 02/02/2013 1353   CO2 27 06/26/2018 0752   CO2 33 (H) 08/23/2017 1026   GLUCOSE 97 06/26/2018 0752   GLUCOSE 92 08/23/2017 1026   GLUCOSE 99 02/02/2013 1353   BUN 22 06/26/2018 0752   BUN 21.0 08/23/2017 1026   CREATININE 1.05  (H) 06/26/2018 0752   CREATININE 1.2 (H) 08/23/2017 1026   CALCIUM 9.5 06/26/2018 0752   CALCIUM 9.3 08/23/2017 1026   PROT 6.1 (L) 06/26/2018 0752   PROT 6.3 (L) 08/23/2017 1026   ALBUMIN 3.9 06/26/2018 0752   ALBUMIN 3.8 08/23/2017 1026   AST 20 06/26/2018 0752   AST 11 08/23/2017 1026   ALT 25 06/26/2018 0752   ALT 10 08/23/2017 1026   ALKPHOS 79 06/26/2018 0752   ALKPHOS 66 08/23/2017 1026   BILITOT 0.4 06/26/2018 0752   BILITOT 0.59 08/23/2017 1026   GFRNONAA 52 (L) 06/26/2018 0752   GFRAA 60 (L) 06/26/2018 0752    No results found for: SPEP, UPEP  Lab Results  Component Value Date   WBC 7.4 06/26/2018   NEUTROABS 5.6 06/26/2018   HGB 12.5 06/26/2018   HCT 37.8 06/26/2018   MCV 84.8 06/26/2018   PLT 47 (L) 06/26/2018      Chemistry      Component Value Date/Time   NA 141 06/26/2018 0752   NA 141 08/23/2017 1026   K 3.8 06/26/2018 0752   K 3.7 08/23/2017 1026   CL 102 06/26/2018 0752   CL 98 02/02/2013 1353   CO2 27 06/26/2018 0752   CO2 33 (H) 08/23/2017 1026   BUN 22 06/26/2018 0752   BUN 21.0 08/23/2017 1026   CREATININE 1.05 (H) 06/26/2018 0752   CREATININE 1.2 (H) 08/23/2017 1026      Component Value Date/Time   CALCIUM 9.5 06/26/2018 0752   CALCIUM 9.3 08/23/2017 1026   ALKPHOS 79 06/26/2018 0752   ALKPHOS 66 08/23/2017 1026   AST 20 06/26/2018 0752   AST 11 08/23/2017 1026   ALT 25 06/26/2018 0752   ALT 10 08/23/2017 1026   BILITOT 0.4 06/26/2018 0752   BILITOT 0.59 08/23/2017 1026       RADIOGRAPHIC STUDIES: I have personally reviewed the radiological images as listed and agreed with the findings in the report. Mm 3d Screen Breast Bilateral  Result Date: 06/22/2018 CLINICAL DATA:  Screening. 72 year old patient with history of chronic lymphocytic leukemia. History of bilateral breast reduction. EXAM: DIGITAL SCREENING BILATERAL MAMMOGRAM WITH TOMO AND CAD COMPARISON:  Previous exam(s). ACR Breast Density Category a: The breast tissue is  almost entirely fatty. FINDINGS: The technologist reports that of positioning for mammogram images was technically challenging due to patient limited mobility, and presence of a Port-A-Cath. Due to limited patient mobility, some of the posterior tissue and pectoralis muscles are excluded from the images. There are no findings suspicious for malignancy. Images were processed with CAD. IMPRESSION: No mammographic evidence of malignancy. A result letter of this screening mammogram will be mailed directly to the patient. RECOMMENDATION: Screening mammogram in one year. (Code:[id number]) BI-RADS CATEGORY  1: Negative. Electronically Signed   By: Curlene Dolphin M.D.   On: 06/22/2018 10:41    All questions were answered. The patient knows to call the clinic with any problems, questions or concerns. No barriers to learning was detected.  I spent 15 minutes counseling the patient face to face. The total time spent in the appointment was 20 minutes and more than 50% was on counseling and review of test results  Heath Lark, MD 06/26/2018 4:18 PM

## 2018-06-26 NOTE — Telephone Encounter (Signed)
Gave avs and calendar ° °

## 2018-07-17 DIAGNOSIS — L821 Other seborrheic keratosis: Secondary | ICD-10-CM | POA: Diagnosis not present

## 2018-07-17 DIAGNOSIS — D1801 Hemangioma of skin and subcutaneous tissue: Secondary | ICD-10-CM | POA: Diagnosis not present

## 2018-07-17 DIAGNOSIS — L218 Other seborrheic dermatitis: Secondary | ICD-10-CM | POA: Diagnosis not present

## 2018-07-17 DIAGNOSIS — L304 Erythema intertrigo: Secondary | ICD-10-CM | POA: Diagnosis not present

## 2018-07-24 ENCOUNTER — Inpatient Hospital Stay: Payer: Medicare Other

## 2018-07-24 ENCOUNTER — Inpatient Hospital Stay: Payer: Medicare Other | Attending: Hematology and Oncology

## 2018-07-24 ENCOUNTER — Encounter: Payer: Self-pay | Admitting: Hematology and Oncology

## 2018-07-24 ENCOUNTER — Inpatient Hospital Stay (HOSPITAL_BASED_OUTPATIENT_CLINIC_OR_DEPARTMENT_OTHER): Payer: Medicare Other | Admitting: Hematology and Oncology

## 2018-07-24 ENCOUNTER — Other Ambulatory Visit: Payer: Self-pay | Admitting: Hematology and Oncology

## 2018-07-24 VITALS — BP 135/62 | HR 70 | Temp 98.6°F | Resp 20

## 2018-07-24 DIAGNOSIS — I10 Essential (primary) hypertension: Secondary | ICD-10-CM

## 2018-07-24 DIAGNOSIS — D6959 Other secondary thrombocytopenia: Secondary | ICD-10-CM | POA: Diagnosis not present

## 2018-07-24 DIAGNOSIS — Z79899 Other long term (current) drug therapy: Secondary | ICD-10-CM | POA: Diagnosis not present

## 2018-07-24 DIAGNOSIS — Z5112 Encounter for antineoplastic immunotherapy: Secondary | ICD-10-CM | POA: Diagnosis not present

## 2018-07-24 DIAGNOSIS — C9110 Chronic lymphocytic leukemia of B-cell type not having achieved remission: Secondary | ICD-10-CM | POA: Diagnosis not present

## 2018-07-24 DIAGNOSIS — D696 Thrombocytopenia, unspecified: Secondary | ICD-10-CM

## 2018-07-24 LAB — CBC WITH DIFFERENTIAL (CANCER CENTER ONLY)
Basophils Absolute: 0.1 10*3/uL (ref 0.0–0.1)
Basophils Relative: 1 %
Eosinophils Absolute: 0.2 10*3/uL (ref 0.0–0.5)
Eosinophils Relative: 3 %
HCT: 37.1 % (ref 34.8–46.6)
Hemoglobin: 12.4 g/dL (ref 11.6–15.9)
Lymphocytes Relative: 6 %
Lymphs Abs: 0.4 10*3/uL — ABNORMAL LOW (ref 0.9–3.3)
MCH: 27.9 pg (ref 25.1–34.0)
MCHC: 33.4 g/dL (ref 31.5–36.0)
MCV: 83.5 fL (ref 79.5–101.0)
Monocytes Absolute: 0.6 10*3/uL (ref 0.1–0.9)
Monocytes Relative: 10 %
Neutro Abs: 5.3 10*3/uL (ref 1.5–6.5)
Neutrophils Relative %: 80 %
Platelet Count: 87 10*3/uL — ABNORMAL LOW (ref 145–400)
RBC: 4.45 MIL/uL (ref 3.70–5.45)
RDW: 15.1 % — ABNORMAL HIGH (ref 11.2–14.5)
WBC Count: 6.5 10*3/uL (ref 3.9–10.3)

## 2018-07-24 LAB — CMP (CANCER CENTER ONLY)
ALT: 27 U/L (ref 0–44)
AST: 20 U/L (ref 15–41)
Albumin: 3.8 g/dL (ref 3.5–5.0)
Alkaline Phosphatase: 78 U/L (ref 38–126)
Anion gap: 10 (ref 5–15)
BUN: 28 mg/dL — ABNORMAL HIGH (ref 8–23)
CO2: 31 mmol/L (ref 22–32)
Calcium: 9.6 mg/dL (ref 8.9–10.3)
Chloride: 102 mmol/L (ref 98–111)
Creatinine: 1.05 mg/dL — ABNORMAL HIGH (ref 0.44–1.00)
GFR, Est AFR Am: 60 mL/min — ABNORMAL LOW (ref >60)
GFR, Estimated: 52 mL/min — ABNORMAL LOW (ref >60)
Glucose, Bld: 122 mg/dL — ABNORMAL HIGH (ref 70–99)
Potassium: 3.7 mmol/L (ref 3.5–5.1)
Sodium: 143 mmol/L (ref 135–145)
Total Bilirubin: 0.3 mg/dL (ref 0.3–1.2)
Total Protein: 6 g/dL — ABNORMAL LOW (ref 6.5–8.1)

## 2018-07-24 LAB — GLUCOSE, CAPILLARY: Glucose-Capillary: 154 mg/dL — ABNORMAL HIGH (ref 70–99)

## 2018-07-24 MED ORDER — ACETAMINOPHEN 325 MG PO TABS
650.0000 mg | ORAL_TABLET | Freq: Once | ORAL | Status: AC
  Administered 2018-07-24: 650 mg via ORAL

## 2018-07-24 MED ORDER — ACETAMINOPHEN 325 MG PO TABS
ORAL_TABLET | ORAL | Status: AC
  Filled 2018-07-24: qty 2

## 2018-07-24 MED ORDER — SODIUM CHLORIDE 0.9% FLUSH
10.0000 mL | Freq: Once | INTRAVENOUS | Status: AC
  Administered 2018-07-24: 10 mL
  Filled 2018-07-24: qty 10

## 2018-07-24 MED ORDER — SODIUM CHLORIDE 0.9 % IV SOLN
Freq: Once | INTRAVENOUS | Status: AC
  Administered 2018-07-24: 10:00:00 via INTRAVENOUS
  Filled 2018-07-24: qty 250

## 2018-07-24 MED ORDER — HEPARIN SOD (PORK) LOCK FLUSH 100 UNIT/ML IV SOLN
500.0000 [IU] | Freq: Once | INTRAVENOUS | Status: AC | PRN
  Administered 2018-07-24: 500 [IU]
  Filled 2018-07-24: qty 5

## 2018-07-24 MED ORDER — SODIUM CHLORIDE 0.9 % IV SOLN
1000.0000 mg | Freq: Once | INTRAVENOUS | Status: AC
  Administered 2018-07-24: 1000 mg via INTRAVENOUS
  Filled 2018-07-24: qty 40

## 2018-07-24 MED ORDER — DEXAMETHASONE SODIUM PHOSPHATE 10 MG/ML IJ SOLN
10.0000 mg | Freq: Once | INTRAMUSCULAR | Status: AC
  Administered 2018-07-24: 10 mg via INTRAVENOUS

## 2018-07-24 MED ORDER — DIPHENHYDRAMINE HCL 50 MG/ML IJ SOLN
INTRAMUSCULAR | Status: AC
  Filled 2018-07-24: qty 1

## 2018-07-24 MED ORDER — SODIUM CHLORIDE 0.9% FLUSH
10.0000 mL | INTRAVENOUS | Status: DC | PRN
  Administered 2018-07-24: 10 mL
  Filled 2018-07-24: qty 10

## 2018-07-24 MED ORDER — DIPHENHYDRAMINE HCL 50 MG/ML IJ SOLN
50.0000 mg | Freq: Once | INTRAMUSCULAR | Status: AC
  Administered 2018-07-24: 50 mg via INTRAVENOUS

## 2018-07-24 MED ORDER — DEXAMETHASONE SODIUM PHOSPHATE 10 MG/ML IJ SOLN
INTRAMUSCULAR | Status: AC
  Filled 2018-07-24: qty 1

## 2018-07-24 NOTE — Assessment & Plan Note (Signed)
She has achieved hematological response Thrombocytopenia is improving slowly Based on imaging studies, the cause of her thrombocytopenia is likely due to sequestration from splenomegaly On average, the patient can tolerate treatment only every other month I plan to schedule her next treatment in December I plan minimum 6 cycles of treatment before repeat another imaging study, due around end of the year

## 2018-07-24 NOTE — Progress Notes (Signed)
Per Dr. Alvy Bimler, [place] to tx with Platelets 87 today.

## 2018-07-24 NOTE — Progress Notes (Signed)
[facility] OFFICE PROGRESS NOTE  Patient Care Team: Jonathon Jordan, MD as PCP - General (Family Medicine)  ASSESSMENT & PLAN:  CLL (chronic lymphocytic leukemia) ([place]) She has achieved hematological response Thrombocytopenia is improving slowly Based on imaging studies, the cause of her thrombocytopenia is likely due to sequestration from splenomegaly On average, the patient can tolerate treatment only every other month I plan to schedule her next treatment in December I plan minimum 6 cycles of treatment before repeat another imaging study, due around end of the year  Thrombocytopenia, unspecified ([place]) She has profound thrombocytopenia due to side effects of treatment Thankfully, she is not symptomatic We will resume treatment today. I plan to give her treatment every other month because of this.  She is not symptomatic  Essential hypertension The patient has element of whitecoat hypertension She will continue close monitoring of blood pressure at home  Morbid obesity due to excess calories ([place]) She has cut out all unhealthy foods including soda and have lost some weight She is currently being seen by nutrition I have congratulated her effort   No orders of the defined types were placed in this encounter.   INTERVAL HISTORY: Please see below for problem oriented charting. She returns for further follow-up with her caregiver She is doing well She has lost some weight She is seeing a dietitian and doing physical therapy She denies recent infection, fever or chills The patient denies any recent signs or symptoms of bleeding such as spontaneous epistaxis, hematuria or hematochezia.   SUMMARY OF ONCOLOGIC HISTORY: Oncology History   FISH del 13 q     CLL (chronic lymphocytic leukemia) ([place])   07/02/2010 Procedure    LN biopsy confirmed CLL    08/18/2010 Procedure    Bone marrow biopsy confirmed CLL    11/18/2010 - 04/15/2011 Chemotherapy    patient  received 6 cycles of FLudarabine and Rituximab    01/13/2012 Relapse/Recurrence    Repeat BM biopsy confirmed relapse    01/27/2012 - 06/23/2012 Chemotherapy    Bendamustine & Rituximab given, complicated by infusion reaction to Rituximab, completed 6 cycles    06/29/2013 Imaging    Ct scan confirmed disease relapse with bulky lymphadenopathy    07/23/2013 Procedure    Repeat BM biopsy due to thrombocytopenia and progressive leukocytosis    08/02/2013 Relapse/Recurrence    Patient consented to start iburitinib as salvage therapy for relapsed CLL    11/09/2013 Imaging    Repeat CT scan the chest, abdomen and pelvis showed greater than 50% response to treatment    06/17/2014 Imaging    Repeat CT scan showed near complete response to treatment.    05/30/2015 Imaging    Repeat CT scan showed near complete response to treatment.    06/14/2016 Imaging    CT scan showed stable scattered small retroperitoneal lymph nodes and pelvic lymph nodes. No findings for recurrent or progressive lymphoma. Stable mild splenomegaly.    12/30/2017 Imaging    1. Recurrent adenopathy within the chest, abdomen, and pelvis. 2. Splenomegaly. 3. Aortic Atherosclerosis (ICD10-I70.0). 4. Coronary artery calcifications.    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 The catheter is ready for immediate use.    01/24/2018 -  Chemotherapy    The patient had Gazyva and 1 dose of bendamustine. Bendamustine was discontinued due  to inability to get insurance approval    05/08/2018 Imaging    1. Interval partial treatment response. Bilateral axillary, mediastinal, right hilar, retroperitoneal and bilateral pelvic adenopathy is all decreased. Mild splenomegaly, decreased. No new or progressive disease. 2. Aortic Atherosclerosis (ICD10-I70.0).     REVIEW OF SYSTEMS:    Constitutional: Denies fevers, chills or abnormal weight loss Eyes: Denies blurriness of vision Ears, nose, mouth, throat, and face: Denies mucositis or sore throat Respiratory: Denies cough, dyspnea or wheezes Cardiovascular: Denies palpitation, chest discomfort or lower extremity swelling Gastrointestinal:  Denies nausea, heartburn or change in bowel habits Skin: Denies abnormal skin rashes Lymphatics: Denies new lymphadenopathy or easy bruising Neurological:Denies numbness, tingling or new weaknesses Behavioral/Psych: Mood is stable, no new changes  All other systems were reviewed with the patient and are negative.  I have reviewed the past medical history, past surgical history, social history and family history with the patient and they are unchanged from previous note.  ALLERGIES:  is allergic to azithromycin and ibuprofen [ibuprofen].  MEDICATIONS:  Current Outpatient Medications  Medication Sig Dispense Refill  . acyclovir (ZOVIRAX) 400 MG tablet Take 1 tablet (400 mg total) by mouth daily. 30 tablet 5  . allopurinol (ZYLOPRIM) 300 MG tablet Take 1 tablet (300 mg total) by mouth daily. 30 tablet 2  . amLODipine (NORVASC) 10 MG tablet TAKE 1 TABLET (10 MG TOTAL) BY MOUTH DAILY. 90 tablet 1  . bisoprolol (ZEBETA) 5 MG tablet   10  . Carboxymethylcellul-Glycerin (REFRESH OPTIVE SENSITIVE OP) Place 1 drop into both eyes as needed.     . citalopram (CELEXA) 20 MG tablet Take 20 mg by mouth every morning.     . lidocaine-prilocaine (EMLA) cream Apply to affected area once 30 g 3  . loratadine (CLARITIN) 10 MG tablet Take 10 mg daily by mouth.    . losartan-hydrochlorothiazide (HYZAAR) 100-25 MG tablet   9  . Multiple Vitamins-Minerals (PRESERVISION AREDS 2) CAPS Take 1 capsule by mouth 2 (two) times daily.    . ondansetron (ZOFRAN) 8 MG tablet Take 1 tablet (8 mg total) by mouth every 8 (eight) hours as needed. Then take as needed for nausea or vomiting. 30 tablet 1  .  prochlorperazine (COMPAZINE) 10 MG tablet Take 1 tablet (10 mg total) by mouth every 6 (six) hours as needed (Nausea or vomiting). 30 tablet 1  . pseudoephedrine-guaifenesin (MUCINEX D) 60-600 MG 12 hr tablet Take 1 tablet by mouth every 12 (twelve) hours.    Marland Kitchen tiZANidine (ZANAFLEX) 2 MG tablet Take by mouth every 6 (six) hours as needed for muscle spasms.    Marland Kitchen triamcinolone (NASACORT) 55 MCG/ACT AERO nasal inhaler Place 2 sprays into the nose daily. 1 Inhaler 12  . Vitamin D, Ergocalciferol, 2000 units CAPS Take by mouth.     No current facility-administered medications for this visit.    Facility-Administered Medications Ordered in Other Visits  Medication Dose Route Frequency Provider Last Rate Last Dose  . heparin lock flush 100 unit/mL  500 Units Intracatheter Once PRN Alvy Bimler, Stephanee Barcomb, MD      . obinutuzumab (GAZYVA) 1,000 mg in sodium chloride 0.9 % 250 mL (3.4483 mg/mL) chemo infusion  1,000 mg Intravenous Once Monchel Pollitt, MD      . sodium chloride flush (NS) 0.9 % injection 10 mL  10 mL Intracatheter PRN Alvy Bimler, Rock Sobol, MD        PHYSICAL EXAMINATION: ECOG PERFORMANCE STATUS: 1 - Symptomatic but completely ambulatory  Vitals:   07/24/18 4259  BP: (!) 151/66  Pulse: 67  Resp: 18  Temp: 97.7 F (36.5 C)  SpO2: 97%   Filed Weights   07/24/18 0834  Weight: 295 lb 6.4 oz (134 kg)    GENERAL:alert, no distress and comfortable SKIN: skin color, texture, turgor are normal, no rashes or significant lesions EYES: normal, Conjunctiva are pink and non-injected, sclera clear OROPHARYNX:no exudate, no erythema and lips, buccal mucosa, and tongue normal  NECK: supple, thyroid normal size, non-tender, without nodularity LYMPH:  no palpable lymphadenopathy in the cervical, axillary or inguinal LUNGS: clear to auscultation and percussion with normal breathing effort HEART: regular rate & rhythm and no murmurs and no lower extremity edema ABDOMEN:abdomen soft, non-tender and normal bowel  sounds Musculoskeletal:no cyanosis of digits and no clubbing  NEURO: alert & oriented x 3 with fluent speech, no focal motor/sensory deficits  LABORATORY DATA:  I have reviewed the data as listed    Component Value Date/Time   NA 143 07/24/2018 0757   NA 141 08/23/2017 1026   K 3.7 07/24/2018 0757   K 3.7 08/23/2017 1026   CL 102 07/24/2018 0757   CL 98 02/02/2013 1353   CO2 31 07/24/2018 0757   CO2 33 (H) 08/23/2017 1026   GLUCOSE 122 (H) 07/24/2018 0757   GLUCOSE 92 08/23/2017 1026   GLUCOSE 99 02/02/2013 1353   BUN 28 (H) 07/24/2018 0757   BUN 21.0 08/23/2017 1026   CREATININE 1.05 (H) 07/24/2018 0757   CREATININE 1.2 (H) 08/23/2017 1026   CALCIUM 9.6 07/24/2018 0757   CALCIUM 9.3 08/23/2017 1026   PROT 6.0 (L) 07/24/2018 0757   PROT 6.3 (L) 08/23/2017 1026   ALBUMIN 3.8 07/24/2018 0757   ALBUMIN 3.8 08/23/2017 1026   AST 20 07/24/2018 0757   AST 11 08/23/2017 1026   ALT 27 07/24/2018 0757   ALT 10 08/23/2017 1026   ALKPHOS 78 07/24/2018 0757   ALKPHOS 66 08/23/2017 1026   BILITOT 0.3 07/24/2018 0757   BILITOT 0.59 08/23/2017 1026   GFRNONAA 52 (L) 07/24/2018 0757   GFRAA 60 (L) 07/24/2018 0757    No results found for: SPEP, UPEP  Lab Results  Component Value Date   WBC 6.5 07/24/2018   NEUTROABS 5.3 07/24/2018   HGB 12.4 07/24/2018   HCT 37.1 07/24/2018   MCV 83.5 07/24/2018   PLT 87 (L) 07/24/2018      Chemistry      Component Value Date/Time   NA 143 07/24/2018 0757   NA 141 08/23/2017 1026   K 3.7 07/24/2018 0757   K 3.7 08/23/2017 1026   CL 102 07/24/2018 0757   CL 98 02/02/2013 1353   CO2 31 07/24/2018 0757   CO2 33 (H) 08/23/2017 1026   BUN 28 (H) 07/24/2018 0757   BUN 21.0 08/23/2017 1026   CREATININE 1.05 (H) 07/24/2018 0757   CREATININE 1.2 (H) 08/23/2017 1026      Component Value Date/Time   CALCIUM 9.6 07/24/2018 0757   CALCIUM 9.3 08/23/2017 1026   ALKPHOS 78 07/24/2018 0757   ALKPHOS 66 08/23/2017 1026   AST 20 07/24/2018 0757    AST 11 08/23/2017 1026   ALT 27 07/24/2018 0757   ALT 10 08/23/2017 1026   BILITOT 0.3 07/24/2018 0757   BILITOT 0.59 08/23/2017 1026       All questions were answered. The patient knows to call the clinic with any problems, questions or concerns. No barriers to learning was detected.  I spent 15 minutes counseling the  patient face to face. The total time spent in the appointment was 20 minutes and more than 50% was on counseling and review of test results  Heath Lark, MD 07/24/2018 11:15 AM

## 2018-07-24 NOTE — Assessment & Plan Note (Signed)
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 I plan to give her treatment every other month because of this.  She is not symptomatic 

## 2018-07-24 NOTE — Assessment & Plan Note (Signed)
She has cut out all unhealthy foods including soda and have lost some weight She is currently being seen by nutrition I have congratulated her effort

## 2018-07-24 NOTE — Patient Instructions (Signed)
[facility] Discharge Instructions for Patients Receiving Chemotherapy  Today you received the following chemotherapy agents: Gazyva.  To help prevent nausea and vomiting after your treatment, we encourage you to take your nausea medication as directed   If you develop nausea and vomiting that is not controlled by your nausea medication, call the clinic.   BELOW ARE SYMPTOMS THAT SHOULD BE REPORTED IMMEDIATELY:  *FEVER GREATER THAN 100.5 F  *CHILLS WITH OR WITHOUT FEVER  NAUSEA AND VOMITING THAT IS NOT CONTROLLED WITH YOUR NAUSEA MEDICATION  *UNUSUAL SHORTNESS OF BREATH  *UNUSUAL BRUISING OR BLEEDING  TENDERNESS IN MOUTH AND THROAT WITH OR WITHOUT PRESENCE OF ULCERS  *URINARY PROBLEMS  *BOWEL PROBLEMS  UNUSUAL RASH Items with * indicate a potential emergency and should be followed up as soon as possible.  Feel free to call the clinic should you have any questions or concerns. The clinic phone number is [phone].  Please show the CHEMO ALERT CARD at check-in to the Emergency Department and triage nurse.    Thrombocytopenia Thrombocytopenia means that you have a low number of platelets in your blood. Platelets are tiny cells in the blood. When you bleed, they clump together at the cut or injury to stop the bleeding. This is called blood clotting. Not having enough platelets can cause bleeding problems. Follow these instructions at home: General instructions  Check your skin and inside your mouth for bruises or blood as told by your doctor.  Check to see if there is blood in your spit (sputum), pee (urine), and poop (stool). Do this as told by your doctor.  Ask your doctor if you can drink alcohol.  Take over-the-counter and prescription medicines only as told by your doctor.  Tell all of your doctors that you have this condition. Be sure to tell your dentist and eye doctor too. Activity  Do not do activities that can cause bumps or bruises until  your doctor says it is okay.  Be careful not to cut yourself: ? When you shave. ? When you use scissors, needles, knives, or other tools.  Be careful not to burn yourself: ? When you use an iron. ? When you cook. Contact a doctor if:  You have bruises and you do not know why. Get help right away if:  You are bleeding anywhere on your body.  You have blood in your spit, pee, or poop. This information is not intended to replace advice given to you by your health care provider. Make sure you discuss any questions you have with your health care provider. Document Released: 09/23/2011 Document Revised: 06/06/2016 Document Reviewed: 04/07/2015 Elsevier Interactive Patient Education  2018 [employer]  

## 2018-07-24 NOTE — Assessment & Plan Note (Signed)
The patient has element of whitecoat hypertension She will continue close monitoring of blood pressure at home 

## 2018-07-25 ENCOUNTER — Telehealth: Payer: Self-pay | Admitting: Hematology and Oncology

## 2018-07-25 NOTE — Telephone Encounter (Signed)
Mailed pt calendar  °

## 2018-07-27 DIAGNOSIS — R739 Hyperglycemia, unspecified: Secondary | ICD-10-CM | POA: Diagnosis not present

## 2018-08-01 DIAGNOSIS — H353132 Nonexudative age-related macular degeneration, bilateral, intermediate dry stage: Secondary | ICD-10-CM | POA: Diagnosis not present

## 2018-08-01 DIAGNOSIS — H35373 Puckering of macula, bilateral: Secondary | ICD-10-CM | POA: Diagnosis not present

## 2018-08-01 DIAGNOSIS — H2513 Age-related nuclear cataract, bilateral: Secondary | ICD-10-CM | POA: Diagnosis not present

## 2018-08-01 DIAGNOSIS — H35033 Hypertensive retinopathy, bilateral: Secondary | ICD-10-CM | POA: Diagnosis not present

## 2018-08-08 DIAGNOSIS — Z1211 Encounter for screening for malignant neoplasm of colon: Secondary | ICD-10-CM | POA: Diagnosis not present

## 2018-08-08 DIAGNOSIS — Z0000 Encounter for general adult medical examination without abnormal findings: Secondary | ICD-10-CM | POA: Diagnosis not present

## 2018-08-08 DIAGNOSIS — E559 Vitamin D deficiency, unspecified: Secondary | ICD-10-CM | POA: Diagnosis not present

## 2018-08-08 DIAGNOSIS — E782 Mixed hyperlipidemia: Secondary | ICD-10-CM | POA: Diagnosis not present

## 2018-08-08 DIAGNOSIS — N183 Chronic kidney disease, stage 3 (moderate): Secondary | ICD-10-CM | POA: Diagnosis not present

## 2018-08-18 ENCOUNTER — Telehealth: Payer: Self-pay

## 2018-08-18 NOTE — Telephone Encounter (Signed)
Olivia Mackie called and left a message. [employer] denied last treatment.  Called back and gave Olivia Mackie billings # [phone]. She verbalized understanding.

## 2018-09-18 ENCOUNTER — Inpatient Hospital Stay: Payer: Medicare Other

## 2018-09-18 ENCOUNTER — Inpatient Hospital Stay: Payer: Medicare Other | Admitting: Hematology and Oncology

## 2018-09-18 ENCOUNTER — Telehealth: Payer: Self-pay | Admitting: Hematology and Oncology

## 2018-09-18 NOTE — Telephone Encounter (Signed)
R/s appt per 1/22 sch message - pt is aware of appt date and time   

## 2018-09-25 ENCOUNTER — Inpatient Hospital Stay: Payer: Medicare Other

## 2018-09-25 ENCOUNTER — Telehealth: Payer: Self-pay

## 2018-09-25 ENCOUNTER — Other Ambulatory Visit: Payer: Self-pay

## 2018-09-25 ENCOUNTER — Telehealth: Payer: Self-pay | Admitting: Hematology and Oncology

## 2018-09-25 ENCOUNTER — Encounter: Payer: Self-pay | Admitting: Hematology and Oncology

## 2018-09-25 ENCOUNTER — Inpatient Hospital Stay (HOSPITAL_BASED_OUTPATIENT_CLINIC_OR_DEPARTMENT_OTHER): Payer: Medicare Other | Admitting: Hematology and Oncology

## 2018-09-25 ENCOUNTER — Inpatient Hospital Stay: Payer: Medicare Other | Attending: Hematology and Oncology

## 2018-09-25 VITALS — BP 115/64 | HR 64 | Temp 98.5°F | Resp 18

## 2018-09-25 DIAGNOSIS — D61818 Other pancytopenia: Secondary | ICD-10-CM | POA: Diagnosis not present

## 2018-09-25 DIAGNOSIS — Z79899 Other long term (current) drug therapy: Secondary | ICD-10-CM | POA: Diagnosis not present

## 2018-09-25 DIAGNOSIS — C9110 Chronic lymphocytic leukemia of B-cell type not having achieved remission: Secondary | ICD-10-CM

## 2018-09-25 DIAGNOSIS — D6959 Other secondary thrombocytopenia: Secondary | ICD-10-CM | POA: Diagnosis not present

## 2018-09-25 DIAGNOSIS — Z5112 Encounter for antineoplastic immunotherapy: Secondary | ICD-10-CM | POA: Diagnosis not present

## 2018-09-25 DIAGNOSIS — D696 Thrombocytopenia, unspecified: Secondary | ICD-10-CM

## 2018-09-25 LAB — CBC WITH DIFFERENTIAL (CANCER CENTER ONLY)
Abs Immature Granulocytes: 0.13 10*3/uL — ABNORMAL HIGH (ref 0.00–0.07)
Basophils Absolute: 0 10*3/uL (ref 0.0–0.1)
Basophils Relative: 1 %
Eosinophils Absolute: 0.2 10*3/uL (ref 0.0–0.5)
Eosinophils Relative: 4 %
HCT: 38.9 % (ref 36.0–46.0)
Hemoglobin: 12.6 g/dL (ref 12.0–15.0)
Immature Granulocytes: 2 %
Lymphocytes Relative: 6 %
Lymphs Abs: 0.4 10*3/uL — ABNORMAL LOW (ref 0.7–4.0)
MCH: 27.4 pg (ref 26.0–34.0)
MCHC: 32.4 g/dL (ref 30.0–36.0)
MCV: 84.6 fL (ref 80.0–100.0)
Monocytes Absolute: 0.7 10*3/uL (ref 0.1–1.0)
Monocytes Relative: 11 %
Neutro Abs: 4.9 10*3/uL (ref 1.7–7.7)
Neutrophils Relative %: 76 %
Platelet Count: 108 10*3/uL — ABNORMAL LOW (ref 150–400)
RBC: 4.6 MIL/uL (ref 3.87–5.11)
RDW: 13.4 % (ref 11.5–15.5)
WBC Count: 6.5 10*3/uL (ref 4.0–10.5)
nRBC: 0 % (ref 0.0–0.2)

## 2018-09-25 LAB — CMP (CANCER CENTER ONLY)
ALT: 24 U/L (ref 0–44)
AST: 16 U/L (ref 15–41)
Albumin: 4 g/dL (ref 3.5–5.0)
Alkaline Phosphatase: 78 U/L (ref 38–126)
Anion gap: 11 (ref 5–15)
BUN: 25 mg/dL — ABNORMAL HIGH (ref 8–23)
CO2: 30 mmol/L (ref 22–32)
Calcium: 9.4 mg/dL (ref 8.9–10.3)
Chloride: 103 mmol/L (ref 98–111)
Creatinine: 1.07 mg/dL — ABNORMAL HIGH (ref 0.44–1.00)
GFR, Est AFR Am: >60 mL/min (ref >60)
GFR, Estimated: 52 mL/min — ABNORMAL LOW (ref >60)
Glucose, Bld: 113 mg/dL — ABNORMAL HIGH (ref 70–99)
Potassium: 3.7 mmol/L (ref 3.5–5.1)
Sodium: 144 mmol/L (ref 135–145)
Total Bilirubin: 0.4 mg/dL (ref 0.3–1.2)
Total Protein: 6.2 g/dL — ABNORMAL LOW (ref 6.5–8.1)

## 2018-09-25 MED ORDER — HEPARIN SOD (PORK) LOCK FLUSH 100 UNIT/ML IV SOLN
500.0000 [IU] | Freq: Once | INTRAVENOUS | Status: AC | PRN
  Administered 2018-09-25: 500 [IU]
  Filled 2018-09-25: qty 5

## 2018-09-25 MED ORDER — DIPHENHYDRAMINE HCL 50 MG/ML IJ SOLN
INTRAMUSCULAR | Status: AC
  Filled 2018-09-25: qty 1

## 2018-09-25 MED ORDER — SODIUM CHLORIDE 0.9% FLUSH
10.0000 mL | INTRAVENOUS | Status: DC | PRN
  Administered 2018-09-25: 10 mL
  Filled 2018-09-25: qty 10

## 2018-09-25 MED ORDER — SODIUM CHLORIDE 0.9 % IV SOLN
Freq: Once | INTRAVENOUS | Status: AC
  Administered 2018-09-25: 09:00:00 via INTRAVENOUS
  Filled 2018-09-25: qty 250

## 2018-09-25 MED ORDER — DEXAMETHASONE SODIUM PHOSPHATE 10 MG/ML IJ SOLN
INTRAMUSCULAR | Status: AC
  Filled 2018-09-25: qty 1

## 2018-09-25 MED ORDER — SODIUM CHLORIDE 0.9 % IV SOLN
1000.0000 mg | Freq: Once | INTRAVENOUS | Status: AC
  Administered 2018-09-25: 1000 mg via INTRAVENOUS
  Filled 2018-09-25: qty 40

## 2018-09-25 MED ORDER — ACETAMINOPHEN 325 MG PO TABS
650.0000 mg | ORAL_TABLET | Freq: Once | ORAL | Status: AC
  Administered 2018-09-25: 650 mg via ORAL

## 2018-09-25 MED ORDER — LIDOCAINE-PRILOCAINE 2.5-2.5 % EX CREA: TOPICAL_CREAM | CUTANEOUS | 5 refills | Status: AC

## 2018-09-25 MED ORDER — DEXAMETHASONE SODIUM PHOSPHATE 10 MG/ML IJ SOLN
10.0000 mg | Freq: Once | INTRAMUSCULAR | Status: AC
  Administered 2018-09-25: 10 mg via INTRAVENOUS

## 2018-09-25 MED ORDER — DIPHENHYDRAMINE HCL 50 MG/ML IJ SOLN
50.0000 mg | Freq: Once | INTRAMUSCULAR | Status: AC
  Administered 2018-09-25: 50 mg via INTRAVENOUS

## 2018-09-25 MED ORDER — SODIUM CHLORIDE 0.9% FLUSH
10.0000 mL | Freq: Once | INTRAVENOUS | Status: AC
  Administered 2018-09-25: 10 mL
  Filled 2018-09-25: qty 10

## 2018-09-25 MED ORDER — ACETAMINOPHEN 325 MG PO TABS
ORAL_TABLET | ORAL | Status: AC
  Filled 2018-09-25: qty 2

## 2018-09-25 NOTE — Assessment & Plan Note (Signed)
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 I plan to give her treatment every other month because of this.  She is not symptomatic

## 2018-09-25 NOTE — Telephone Encounter (Signed)
Patient called from infusion room requesting Rx refill on Emla cream. Called [facility] and spoke with nurse, Rx for Emla cream faxed to [facility] at [phone].

## 2018-09-25 NOTE — Progress Notes (Signed)
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 I plan to give her treatment every other month because of this.  She is not symptomatic  Morbid obesity due to excess calories [facility]) She is doing very well and have lost a lot of weight since last time I saw her I congratulated her effort She will continue dietary modification and exercise to lose weight.   No orders of the defined types were placed in this encounter.   INTERVAL HISTORY: Please see below for problem oriented charting. She returns for further follow-up She is doing very well She has lost almost 10 pounds since the last time I saw her through dietary modification and exercise Denies recent infection, fever or chills No new lymphadenopathy. The patient denies any recent signs or symptoms of bleeding such as spontaneous epistaxis, hematuria or hematochezia.   SUMMARY OF ONCOLOGIC HISTORY: Oncology History   FISH del 13 q     CLL (chronic lymphocytic leukemia) ([place])   07/02/2010 Procedure    LN biopsy confirmed CLL    08/18/2010 Procedure    Bone marrow biopsy confirmed CLL    11/18/2010 - 04/15/2011 Chemotherapy    patient received 6 cycles of FLudarabine and Rituximab    01/13/2012 Relapse/Recurrence    Repeat BM biopsy confirmed relapse    01/27/2012 - 06/23/2012 Chemotherapy    Bendamustine & Rituximab given, complicated by infusion reaction to Rituximab, completed 6 cycles    06/29/2013 Imaging     Ct scan confirmed disease relapse with bulky lymphadenopathy    07/23/2013 Procedure    Repeat BM biopsy due to thrombocytopenia and progressive leukocytosis    08/02/2013 Relapse/Recurrence    Patient consented to start iburitinib as salvage therapy for relapsed CLL    11/09/2013 Imaging    Repeat CT scan the chest, abdomen and pelvis showed greater than 50% response to treatment    06/17/2014 Imaging    Repeat CT scan showed near complete response to treatment.    05/30/2015 Imaging    Repeat CT scan showed near complete response to treatment.    06/14/2016 Imaging    CT scan showed stable scattered small retroperitoneal lymph nodes and pelvic lymph nodes. No findings for recurrent or progressive lymphoma. Stable mild splenomegaly.    12/30/2017 Imaging    1. Recurrent adenopathy within the chest, abdomen, and pelvis. 2. Splenomegaly. 3. Aortic Atherosclerosis (ICD10-I70.0). 4. Coronary artery calcifications.    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 The catheter is ready for immediate use.    01/24/2018 -  Chemotherapy    The patient had Gazyva and 1 dose of bendamustine. Bendamustine was discontinued due to inability to get insurance approval    05/08/2018 Imaging    1. Interval partial treatment response. Bilateral axillary, mediastinal, right hilar, retroperitoneal and bilateral pelvic adenopathy is all decreased. Mild splenomegaly, decreased. No new  or progressive disease. 2. Aortic Atherosclerosis (ICD10-I70.0).     REVIEW OF SYSTEMS:   Constitutional: Denies fevers, chills or abnormal weight loss Eyes: Denies blurriness of vision Ears, nose, mouth, throat, and face: Denies mucositis or sore throat Respiratory: Denies cough, dyspnea or wheezes Cardiovascular: Denies palpitation, chest discomfort or lower  extremity swelling Gastrointestinal:  Denies nausea, heartburn or change in bowel habits Skin: Denies abnormal skin rashes Lymphatics: Denies new lymphadenopathy or easy bruising Neurological:Denies numbness, tingling or new weaknesses Behavioral/Psych: Mood is stable, no new changes  All other systems were reviewed with the patient and are negative.  I have reviewed the past medical history, past surgical history, social history and family history with the patient and they are unchanged from previous note.  ALLERGIES:  is allergic to azithromycin and ibuprofen [ibuprofen].  MEDICATIONS:  Current Outpatient Medications  Medication Sig Dispense Refill  . acyclovir (ZOVIRAX) 400 MG tablet Take 1 tablet (400 mg total) by mouth daily. 30 tablet 5  . allopurinol (ZYLOPRIM) 300 MG tablet Take 1 tablet (300 mg total) by mouth daily. 30 tablet 2  . amLODipine (NORVASC) 10 MG tablet TAKE 1 TABLET (10 MG TOTAL) BY MOUTH DAILY. 90 tablet 1  . bisoprolol (ZEBETA) 5 MG tablet   10  . Carboxymethylcellul-Glycerin (REFRESH OPTIVE SENSITIVE OP) Place 1 drop into both eyes as needed.     . citalopram (CELEXA) 20 MG tablet Take 20 mg by mouth every morning.     . lidocaine-prilocaine (EMLA) cream Apply to affected area once 30 g 3  . loratadine (CLARITIN) 10 MG tablet Take 10 mg daily by mouth.    . losartan-hydrochlorothiazide (HYZAAR) 100-25 MG tablet   9  . Multiple Vitamins-Minerals (PRESERVISION AREDS 2) CAPS Take 1 capsule by mouth 2 (two) times daily.    . ondansetron (ZOFRAN) 8 MG tablet Take 1 tablet (8 mg total) by mouth every 8 (eight) hours as needed. Then take as needed for nausea or vomiting. 30 tablet 1  . prochlorperazine (COMPAZINE) 10 MG tablet Take 1 tablet (10 mg total) by mouth every 6 (six) hours as needed (Nausea or vomiting). 30 tablet 1  . pseudoephedrine-guaifenesin (MUCINEX D) 60-600 MG 12 hr tablet Take 1 tablet by mouth every 12 (twelve) hours.    Marland Kitchen tiZANidine (ZANAFLEX) 2 MG  tablet Take by mouth every 6 (six) hours as needed for muscle spasms.    Marland Kitchen triamcinolone (NASACORT) 55 MCG/ACT AERO nasal inhaler Place 2 sprays into the nose daily. 1 Inhaler 12  . Vitamin D, Ergocalciferol, 2000 units CAPS Take by mouth.     No current facility-administered medications for this visit.     PHYSICAL EXAMINATION: ECOG PERFORMANCE STATUS: 0 - Asymptomatic  Vitals:   09/25/18 0845  BP: (!) 161/68  Pulse: 67  Resp: 17  Temp: 97.8 F (36.6 C)  SpO2: 96%   Filed Weights   09/25/18 0845  Weight: 285 lb 12.8 oz (129.6 kg)    GENERAL:alert, no distress and comfortable SKIN: skin color, texture, turgor are normal, no rashes or significant lesions EYES: normal, Conjunctiva are pink and non-injected, sclera clear OROPHARYNX:no exudate, no erythema and lips, buccal mucosa, and tongue normal  NECK: supple, thyroid normal size, non-tender, without nodularity LYMPH:  no palpable lymphadenopathy in the cervical, axillary or inguinal LUNGS: clear to auscultation and percussion with normal breathing effort HEART: regular rate & rhythm and no murmurs and no lower extremity edema ABDOMEN:abdomen soft, non-tender and normal bowel sounds Musculoskeletal:no cyanosis of digits and  no clubbing  NEURO: alert & oriented x 3 with fluent speech, no focal motor/sensory deficits  LABORATORY DATA:  I have reviewed the data as listed    Component Value Date/Time   NA 143 07/24/2018 0757   NA 141 08/23/2017 1026   K 3.7 07/24/2018 0757   K 3.7 08/23/2017 1026   CL 102 07/24/2018 0757   CL 98 02/02/2013 1353   CO2 31 07/24/2018 0757   CO2 33 (H) 08/23/2017 1026   GLUCOSE 122 (H) 07/24/2018 0757   GLUCOSE 92 08/23/2017 1026   GLUCOSE 99 02/02/2013 1353   BUN 28 (H) 07/24/2018 0757   BUN 21.0 08/23/2017 1026   CREATININE 1.05 (H) 07/24/2018 0757   CREATININE 1.2 (H) 08/23/2017 1026   CALCIUM 9.6 07/24/2018 0757   CALCIUM 9.3 08/23/2017 1026   PROT 6.0 (L) 07/24/2018 0757   PROT  6.3 (L) 08/23/2017 1026   ALBUMIN 3.8 07/24/2018 0757   ALBUMIN 3.8 08/23/2017 1026   AST 20 07/24/2018 0757   AST 11 08/23/2017 1026   ALT 27 07/24/2018 0757   ALT 10 08/23/2017 1026   ALKPHOS 78 07/24/2018 0757   ALKPHOS 66 08/23/2017 1026   BILITOT 0.3 07/24/2018 0757   BILITOT 0.59 08/23/2017 1026   GFRNONAA 52 (L) 07/24/2018 0757   GFRAA 60 (L) 07/24/2018 0757    No results found for: SPEP, UPEP  Lab Results  Component Value Date   WBC 6.5 09/25/2018   NEUTROABS 4.9 09/25/2018   HGB 12.6 09/25/2018   HCT 38.9 09/25/2018   MCV 84.6 09/25/2018   PLT 108 (L) 09/25/2018      Chemistry      Component Value Date/Time   NA 143 07/24/2018 0757   NA 141 08/23/2017 1026   K 3.7 07/24/2018 0757   K 3.7 08/23/2017 1026   CL 102 07/24/2018 0757   CL 98 02/02/2013 1353   CO2 31 07/24/2018 0757   CO2 33 (H) 08/23/2017 1026   BUN 28 (H) 07/24/2018 0757   BUN 21.0 08/23/2017 1026   CREATININE 1.05 (H) 07/24/2018 0757   CREATININE 1.2 (H) 08/23/2017 1026      Component Value Date/Time   CALCIUM 9.6 07/24/2018 0757   CALCIUM 9.3 08/23/2017 1026   ALKPHOS 78 07/24/2018 0757   ALKPHOS 66 08/23/2017 1026   AST 20 07/24/2018 0757   AST 11 08/23/2017 1026   ALT 27 07/24/2018 0757   ALT 10 08/23/2017 1026   BILITOT 0.3 07/24/2018 0757   BILITOT 0.59 08/23/2017 1026       All questions were answered. The patient knows to call the clinic with any problems, questions or concerns. No barriers to learning was detected.  I spent 15 minutes counseling the patient face to face. The total time spent in the appointment was 20 minutes and more than 50% was on counseling and review of test results  Heath Lark, MD 09/25/2018 8:54 AM

## 2018-09-25 NOTE — Patient Instructions (Signed)
[facility] Discharge Instructions for Patients Receiving Chemotherapy  Today you received the following chemotherapy agents: Gazyva.  To help prevent nausea and vomiting after your treatment, we encourage you to take your nausea medication as directed   If you develop nausea and vomiting that is not controlled by your nausea medication, call the clinic.   BELOW ARE SYMPTOMS THAT SHOULD BE REPORTED IMMEDIATELY:  *FEVER GREATER THAN 100.5 F  *CHILLS WITH OR WITHOUT FEVER  NAUSEA AND VOMITING THAT IS NOT CONTROLLED WITH YOUR NAUSEA MEDICATION  *UNUSUAL SHORTNESS OF BREATH  *UNUSUAL BRUISING OR BLEEDING  TENDERNESS IN MOUTH AND THROAT WITH OR WITHOUT PRESENCE OF ULCERS  *URINARY PROBLEMS  *BOWEL PROBLEMS  UNUSUAL RASH Items with * indicate a potential emergency and should be followed up as soon as possible.  Feel free to call the clinic should you have any questions or concerns. The clinic phone number is [phone].  Please show the [facility] at check-in to the Emergency Department and triage nurse.    Thrombocytopenia Thrombocytopenia means that you have a low number of platelets in your blood. Platelets are tiny cells in the blood. When you bleed, they clump together at the cut or injury to stop the bleeding. This is called blood clotting. Not having enough platelets can cause bleeding problems. Follow these instructions at home: General instructions  Check your skin and inside your mouth for bruises or blood as told by your doctor.  Check to see if there is blood in your spit (sputum), pee (urine), and poop (stool). Do this as told by your doctor.  Ask your doctor if you can drink alcohol.  Take over-the-counter and prescription medicines only as told by your doctor.  Tell all of your doctors that you have this condition. Be sure to tell your dentist and eye doctor too. Activity  Do not do activities that can cause bumps or bruises until  your doctor says it is okay.  Be careful not to cut yourself: ? When you shave. ? When you use scissors, needles, knives, or other tools.  Be careful not to burn yourself: ? When you use an iron. ? When you cook. Contact a doctor if:  You have bruises and you do not know why. Get help right away if:  You are bleeding anywhere on your body.  You have blood in your spit, pee, or poop. This information is not intended to replace advice given to you by your health care provider. Make sure you discuss any questions you have with your health care provider. Document Released: 09/23/2011 Document Revised: 06/06/2016 Document Reviewed: 04/07/2015 Elsevier Interactive Patient Education  [employer].

## 2018-09-25 NOTE — Assessment & Plan Note (Signed)
She is doing very well and have lost a lot of weight since last time I saw her I congratulated her effort She will continue dietary modification and exercise to lose weight.

## 2018-09-25 NOTE — Telephone Encounter (Signed)
Gave avs and calendar ° °

## 2018-09-25 NOTE — Assessment & Plan Note (Signed)
She has no clinical signs of disease progression We will continue treatment every other month due to her chronic pancytopenia I recommend we proceed with cycle 5 and cycle 6 before repeat imaging study next year

## 2018-11-13 DIAGNOSIS — Z23 Encounter for immunization: Secondary | ICD-10-CM | POA: Diagnosis not present

## 2018-11-13 DIAGNOSIS — E782 Mixed hyperlipidemia: Secondary | ICD-10-CM | POA: Diagnosis not present

## 2018-11-13 DIAGNOSIS — I10 Essential (primary) hypertension: Secondary | ICD-10-CM | POA: Diagnosis not present

## 2018-11-13 DIAGNOSIS — R739 Hyperglycemia, unspecified: Secondary | ICD-10-CM | POA: Diagnosis not present

## 2018-11-20 ENCOUNTER — Inpatient Hospital Stay: Payer: Medicare Other | Admitting: Hematology and Oncology

## 2018-11-20 ENCOUNTER — Inpatient Hospital Stay: Payer: Medicare Other

## 2018-11-20 ENCOUNTER — Inpatient Hospital Stay: Payer: Medicare Other | Attending: Hematology and Oncology

## 2018-11-20 DIAGNOSIS — Z5112 Encounter for antineoplastic immunotherapy: Secondary | ICD-10-CM | POA: Insufficient documentation

## 2018-11-20 DIAGNOSIS — I10 Essential (primary) hypertension: Secondary | ICD-10-CM | POA: Insufficient documentation

## 2018-11-20 DIAGNOSIS — C9110 Chronic lymphocytic leukemia of B-cell type not having achieved remission: Secondary | ICD-10-CM | POA: Insufficient documentation

## 2018-11-24 ENCOUNTER — Telehealth: Payer: Self-pay | Admitting: Hematology and Oncology

## 2018-11-24 NOTE — Telephone Encounter (Signed)
R/s appts from 2/3 - pt niece is aware of appts on 2/10 and 2/11

## 2018-11-27 ENCOUNTER — Encounter: Payer: Self-pay | Admitting: Hematology and Oncology

## 2018-11-27 ENCOUNTER — Inpatient Hospital Stay: Payer: Medicare Other

## 2018-11-27 ENCOUNTER — Telehealth: Payer: Self-pay

## 2018-11-27 ENCOUNTER — Inpatient Hospital Stay (HOSPITAL_BASED_OUTPATIENT_CLINIC_OR_DEPARTMENT_OTHER): Payer: Medicare Other | Admitting: Hematology and Oncology

## 2018-11-27 VITALS — BP 163/70 | HR 69 | Temp 98.4°F | Resp 18 | Ht 63.0 in | Wt 283.6 lb

## 2018-11-27 DIAGNOSIS — C9110 Chronic lymphocytic leukemia of B-cell type not having achieved remission: Secondary | ICD-10-CM

## 2018-11-27 DIAGNOSIS — I10 Essential (primary) hypertension: Secondary | ICD-10-CM

## 2018-11-27 DIAGNOSIS — R05 Cough: Secondary | ICD-10-CM

## 2018-11-27 DIAGNOSIS — D696 Thrombocytopenia, unspecified: Secondary | ICD-10-CM

## 2018-11-27 DIAGNOSIS — R0981 Nasal congestion: Secondary | ICD-10-CM

## 2018-11-27 DIAGNOSIS — R634 Abnormal weight loss: Secondary | ICD-10-CM

## 2018-11-27 DIAGNOSIS — Z5112 Encounter for antineoplastic immunotherapy: Secondary | ICD-10-CM | POA: Diagnosis not present

## 2018-11-27 LAB — CMP (CANCER CENTER ONLY)
ALT: 20 U/L (ref 0–44)
AST: 15 U/L (ref 15–41)
Albumin: 3.9 g/dL (ref 3.5–5.0)
Alkaline Phosphatase: 79 U/L (ref 38–126)
Anion gap: 8 (ref 5–15)
BUN: 22 mg/dL (ref 8–23)
CO2: 32 mmol/L (ref 22–32)
Calcium: 9.4 mg/dL (ref 8.9–10.3)
Chloride: 102 mmol/L (ref 98–111)
Creatinine: 1.25 mg/dL — ABNORMAL HIGH (ref 0.44–1.00)
GFR, Est AFR Am: 50 mL/min — ABNORMAL LOW
GFR, Estimated: 43 mL/min — ABNORMAL LOW
Glucose, Bld: 101 mg/dL — ABNORMAL HIGH (ref 70–99)
Potassium: 3.9 mmol/L (ref 3.5–5.1)
Sodium: 142 mmol/L (ref 135–145)
Total Bilirubin: 0.4 mg/dL (ref 0.3–1.2)
Total Protein: 6.1 g/dL — ABNORMAL LOW (ref 6.5–8.1)

## 2018-11-27 LAB — CBC WITH DIFFERENTIAL (CANCER CENTER ONLY)
Abs Immature Granulocytes: 0.14 10*3/uL — ABNORMAL HIGH (ref 0.00–0.07)
Basophils Absolute: 0 10*3/uL (ref 0.0–0.1)
Basophils Relative: 0 %
Eosinophils Absolute: 0.3 10*3/uL (ref 0.0–0.5)
Eosinophils Relative: 3 %
HCT: 39.7 % (ref 36.0–46.0)
Hemoglobin: 12.6 g/dL (ref 12.0–15.0)
Immature Granulocytes: 2 %
Lymphocytes Relative: 5 %
Lymphs Abs: 0.5 10*3/uL — ABNORMAL LOW (ref 0.7–4.0)
MCH: 26.9 pg (ref 26.0–34.0)
MCHC: 31.7 g/dL (ref 30.0–36.0)
MCV: 84.8 fL (ref 80.0–100.0)
Monocytes Absolute: 0.8 10*3/uL (ref 0.1–1.0)
Monocytes Relative: 9 %
Neutro Abs: 7.4 10*3/uL (ref 1.7–7.7)
Neutrophils Relative %: 81 %
Platelet Count: 121 10*3/uL — ABNORMAL LOW (ref 150–400)
RBC: 4.68 MIL/uL (ref 3.87–5.11)
RDW: 13.6 % (ref 11.5–15.5)
WBC Count: 9.1 10*3/uL (ref 4.0–10.5)
nRBC: 0 % (ref 0.0–0.2)

## 2018-11-27 MED ORDER — SODIUM CHLORIDE 0.9% FLUSH
10.0000 mL | Freq: Once | INTRAVENOUS | Status: AC
  Administered 2018-11-27: 10 mL
  Filled 2018-11-27: qty 10

## 2018-11-27 MED ORDER — HEPARIN SOD (PORK) LOCK FLUSH 100 UNIT/ML IV SOLN
500.0000 [IU] | Freq: Once | INTRAVENOUS | Status: AC
  Administered 2018-11-27: 500 [IU]
  Filled 2018-11-27: qty 5

## 2018-11-27 MED ORDER — HEPARIN SOD (PORK) LOCK FLUSH 100 UNIT/ML IV SOLN
500.0000 [IU] | Freq: Once | INTRAVENOUS | Status: DC
  Filled 2018-11-27: qty 5

## 2018-11-27 NOTE — Assessment & Plan Note (Signed)
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 I plan to give her treatment every other month because of this.  She is not symptomatic

## 2018-11-27 NOTE — Telephone Encounter (Signed)
Printed avs and calender of upcoming appointment. Per 2/10 los 

## 2018-11-27 NOTE — Assessment & Plan Note (Signed)
She is doing well with weight loss strategy She will continue to see a dietitian with dietary modification

## 2018-11-27 NOTE — Progress Notes (Signed)
[place] OFFICE PROGRESS NOTE  Patient Care Team: Jonathon Jordan, MD as PCP - General (Family Medicine)  ASSESSMENT & PLAN:  CLL (chronic lymphocytic leukemia) ([place]) She has no clinical signs of disease progression We will continue treatment every other month due to her chronic pancytopenia I recommend we proceed with cycle 6 tomorrow and I plan to repeat imaging study next month  Thrombocytopenia, unspecified ([place]) She has profound thrombocytopenia due to side effects of treatment Thankfully, she is not symptomatic We will resume treatment today. I plan to give her treatment every other month because of this.  She is not symptomatic  Morbid obesity due to excess calories ([place]) She is doing well with weight loss strategy She will continue to see a dietitian with dietary modification  Essential hypertension The patient has element of whitecoat hypertension She will continue close monitoring of blood pressure at home   Orders Placed This Encounter  Procedures  . CT CHEST W CONTRAST    Standing Status:   Future    Standing Expiration Date:   11/28/2019    Order Specific Question:   If indicated for the ordered procedure, I authorize the administration of contrast media per Radiology protocol    Answer:   Yes    Order Specific Question:   Preferred imaging location?    Answer:   [facility]    Order Specific Question:   Radiology Contrast Protocol - do NOT remove file path    Answer:   \\[email or web address].[email or web address]  . CT ABDOMEN PELVIS W CONTRAST    Standing Status:   Future    Standing Expiration Date:   11/28/2019    Order Specific Question:   If indicated for the ordered procedure, I authorize the administration of contrast media per Radiology protocol    Answer:   Yes    Order Specific Question:   Preferred imaging location?    Answer:   [facility]    Order Specific Question:   Radiology Contrast Protocol - do NOT remove  file path    Answer:   \\[email or web address]    INTERVAL HISTORY: Please see below for problem oriented charting. She returns with her niece for further chemotherapy and follow-up She missed appointment last week She feels well She has lost some weight recently The patient denies any recent signs or symptoms of bleeding such as spontaneous epistaxis, hematuria or hematochezia. She continues to have intermittent nasal congestion and occasional cough but no fevers No new lymphadenopathy  SUMMARY OF ONCOLOGIC HISTORY: Oncology History   FISH del 13 q     CLL (chronic lymphocytic leukemia) ([place])   07/02/2010 Procedure    LN biopsy confirmed CLL    08/18/2010 Procedure    Bone marrow biopsy confirmed CLL    11/18/2010 - 04/15/2011 Chemotherapy    patient received 6 cycles of FLudarabine and Rituximab    01/13/2012 Relapse/Recurrence    Repeat BM biopsy confirmed relapse    01/27/2012 - 06/23/2012 Chemotherapy    Bendamustine & Rituximab given, complicated by infusion reaction to Rituximab, completed 6 cycles    06/29/2013 Imaging    Ct scan confirmed disease relapse with bulky lymphadenopathy    07/23/2013 Procedure    Repeat BM biopsy due to thrombocytopenia and progressive leukocytosis    08/02/2013 Relapse/Recurrence    Patient consented to start iburitinib as salvage therapy for relapsed CLL    11/09/2013 Imaging    Repeat CT scan the chest, abdomen and pelvis showed greater than  50% response to treatment    06/17/2014 Imaging    Repeat CT scan showed near complete response to treatment.    05/30/2015 Imaging    Repeat CT scan showed near complete response to treatment.    06/14/2016 Imaging    CT scan showed stable scattered small retroperitoneal lymph nodes and pelvic lymph nodes. No findings for recurrent or progressive lymphoma. Stable mild splenomegaly.    12/30/2017 Imaging    1. Recurrent adenopathy within the chest, abdomen, and pelvis. 2.  Splenomegaly. 3. Aortic Atherosclerosis (ICD10-I70.0). 4. Coronary artery calcifications.    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 The catheter is ready for immediate use.    01/24/2018 -  Chemotherapy    The patient had Gazyva and 1 dose of bendamustine. Bendamustine was discontinued due to inability to get insurance approval    05/08/2018 Imaging    1. Interval partial treatment response. Bilateral axillary, mediastinal, right hilar, retroperitoneal and bilateral pelvic adenopathy is all decreased. Mild splenomegaly, decreased. No new or progressive disease. 2. Aortic Atherosclerosis (ICD10-I70.0).     REVIEW OF SYSTEMS:   Constitutional: Denies fevers, chills or abnormal weight loss Eyes: Denies blurriness of vision Ears, nose, mouth, throat, and face: Denies mucositis or sore throat Cardiovascular: Denies palpitation, chest discomfort or lower extremity swelling Gastrointestinal:  Denies nausea, heartburn or change in bowel habits Skin: Denies abnormal skin rashes Lymphatics: Denies new lymphadenopathy or easy bruising Neurological:Denies numbness, tingling or new weaknesses Behavioral/Psych: Mood is stable, no new changes  All other systems were reviewed with the patient and are negative.  I have reviewed the past medical history, past surgical history, social history and family history with the patient and they are unchanged from previous note.  ALLERGIES:  is allergic to azithromycin and ibuprofen [ibuprofen].  MEDICATIONS:  Current Outpatient Medications  Medication Sig Dispense Refill  . azelastine (ASTELIN) 0.1 % nasal spray Place 1 spray into both nostrils 2 (two) times daily. Use in each nostril as directed    . fluticasone (FLONASE) 50 MCG/ACT nasal spray Place 1 spray into both nostrils daily.    .  fluticasone-salmeterol (ADVAIR HFA) 115-21 MCG/ACT inhaler Inhale 2 puffs into the lungs 2 (two) times daily.    Mar[place] Kitchen acyclovir (ZOVIRAX) 400 MG tablet Take 1 tablet (400 mg total) by mouth daily. 30 tablet 5  . allopurinol (ZYLOPRIM) 300 MG tablet Take 1 tablet (300 mg total) by mouth daily. 30 tablet 2  . amLODipine (NORVASC) 10 MG tablet TAKE 1 TABLET (10 MG TOTAL) BY MOUTH DAILY. 90 tablet 1  . bisoprolol (ZEBETA) 5 MG tablet   10  . Carboxymethylcellul-Glycerin (REFRESH OPTIVE SENSITIVE OP) Place 1 drop into both eyes as needed.     . citalopram (CELEXA) 20 MG tablet Take 20 mg by mouth every morning.     . lidocaine-prilocaine (EMLA) cream Apply to affected area once 30 g 5  . loratadine (CLARITIN) 10 MG tablet Take 10 mg daily by mouth.    . losartan-hydrochlorothiazide (HYZAAR) 100-25 MG tablet   9  . Multiple Vitamins-Minerals (PRESERVISION AREDS 2) CAPS Take 1 capsule by mouth 2 (two) times daily.    . ondansetron (ZOFRAN) 8 MG tablet Take 1 tablet (8 mg total) by mouth every 8 (eight) hours as needed. Then take as needed for nausea or vomiting. 30 tablet  1  . prochlorperazine (COMPAZINE) 10 MG tablet Take 1 tablet (10 mg total) by mouth every 6 (six) hours as needed (Nausea or vomiting). 30 tablet 1  . pseudoephedrine-guaifenesin (MUCINEX D) 60-600 MG 12 hr tablet Take 1 tablet by mouth every 12 (twelve) hours.    [place] Kitchen tiZANidine (ZANAFLEX) 2 MG tablet Take by mouth every 6 (six) hours as needed for muscle spasms.    Mar[place] Kitchen triamcinolone (NASACORT) 55 MCG/ACT AERO nasal inhaler Place 2 sprays into the nose daily. 1 Inhaler 12  . Vitamin D, Ergocalciferol, 2000 units CAPS Take by mouth.     No current facility-administered medications for this visit.     PHYSICAL EXAMINATION: ECOG PERFORMANCE STATUS: 1 - Symptomatic but completely ambulatory  Vitals:   11/27/18 1225  BP: (!) 163/70  Pulse: 69  Resp: 18  Temp: 98.4 F (36.9 C)  SpO2: 94%   Filed Weights   11/27/18 1225  Weight: 283  lb 9.6 oz (128.6 kg)    GENERAL:alert, no distress and comfortable SKIN: skin color, texture, turgor are normal, no rashes or significant lesions EYES: normal, Conjunctiva are pink and non-injected, sclera clear OROPHARYNX:no exudate, no erythema and lips, buccal mucosa, and tongue normal  NECK: supple, thyroid normal size, non-tender, without nodularity LYMPH:  no palpable lymphadenopathy in the cervical, axillary or inguinal LUNGS: clear to auscultation and percussion with normal breathing effort HEART: regular rate & rhythm and no murmurs and no lower extremity edema ABDOMEN:abdomen soft, non-tender and normal bowel sounds Musculoskeletal:no cyanosis of digits and no clubbing  NEURO: alert & oriented x 3 with fluent speech, no focal motor/sensory deficits  LABORATORY DATA:  I have reviewed the data as listed    Component Value Date/Time   NA 142 11/27/2018 1127   NA 141 08/23/2017 1026   K 3.9 11/27/2018 1127   K 3.7 08/23/2017 1026   CL 102 11/27/2018 1127   CL 98 02/02/2013 1353   CO2 32 11/27/2018 1127   CO2 33 (H) 08/23/2017 1026   GLUCOSE 101 (H) 11/27/2018 1127   GLUCOSE 92 08/23/2017 1026   GLUCOSE 99 02/02/2013 1353   BUN 22 11/27/2018 1127   BUN 21.0 08/23/2017 1026   CREATININE 1.25 (H) 11/27/2018 1127   CREATININE 1.2 (H) 08/23/2017 1026   CALCIUM 9.4 11/27/2018 1127   CALCIUM 9.3 08/23/2017 1026   PROT 6.1 (L) 11/27/2018 1127   PROT 6.3 (L) 08/23/2017 1026   ALBUMIN 3.9 11/27/2018 1127   ALBUMIN 3.8 08/23/2017 1026   AST 15 11/27/2018 1127   AST 11 08/23/2017 1026   ALT 20 11/27/2018 1127   ALT 10 08/23/2017 1026   ALKPHOS 79 11/27/2018 1127   ALKPHOS 66 08/23/2017 1026   BILITOT 0.4 11/27/2018 1127   BILITOT 0.59 08/23/2017 1026   GFRNONAA 43 (L) 11/27/2018 1127   GFRAA 50 (L) 11/27/2018 1127    No results found for: SPEP, UPEP  Lab Results  Component Value Date   WBC 9.1 11/27/2018   NEUTROABS 7.4 11/27/2018   HGB 12.6 11/27/2018   HCT 39.7  11/27/2018   MCV 84.8 11/27/2018   PLT 121 (L) 11/27/2018      Chemistry      Component Value Date/Time   NA 142 11/27/2018 1127   NA 141 08/23/2017 1026   K 3.9 11/27/2018 1127   K 3.7 08/23/2017 1026   CL 102 11/27/2018 1127   CL 98 02/02/2013 1353   CO2 32 11/27/2018 1127   CO2 33 (H) 08/23/2017  1026   BUN 22 11/27/2018 1127   BUN 21.0 08/23/2017 1026   CREATININE 1.25 (H) 11/27/2018 1127   CREATININE 1.2 (H) 08/23/2017 1026      Component Value Date/Time   CALCIUM 9.4 11/27/2018 1127   CALCIUM 9.3 08/23/2017 1026   ALKPHOS 79 11/27/2018 1127   ALKPHOS 66 08/23/2017 1026   AST 15 11/27/2018 1127   AST 11 08/23/2017 1026   ALT 20 11/27/2018 1127   ALT 10 08/23/2017 1026   BILITOT 0.4 11/27/2018 1127   BILITOT 0.59 08/23/2017 1026      All questions were answered. The patient knows to call the clinic with any problems, questions or concerns. No barriers to learning was detected.  I spent 15 minutes counseling the patient face to face. The total time spent in the appointment was 20 minutes and more than 50% was on counseling and review of test results  Heath Lark, MD 11/27/2018 1:48 PM

## 2018-11-27 NOTE — Assessment & Plan Note (Signed)
She has no clinical signs of disease progression We will continue treatment every other month due to her chronic pancytopenia I recommend we proceed with cycle 6 tomorrow and I plan to repeat imaging study next month

## 2018-11-27 NOTE — Assessment & Plan Note (Signed)
The patient has element of whitecoat hypertension She will continue close monitoring of blood pressure at home

## 2018-11-28 ENCOUNTER — Inpatient Hospital Stay: Payer: Medicare Other

## 2018-11-28 VITALS — BP 141/73 | HR 70 | Temp 98.8°F | Resp 16

## 2018-11-28 DIAGNOSIS — C9110 Chronic lymphocytic leukemia of B-cell type not having achieved remission: Secondary | ICD-10-CM | POA: Diagnosis not present

## 2018-11-28 DIAGNOSIS — I10 Essential (primary) hypertension: Secondary | ICD-10-CM | POA: Diagnosis not present

## 2018-11-28 DIAGNOSIS — Z5112 Encounter for antineoplastic immunotherapy: Secondary | ICD-10-CM | POA: Diagnosis not present

## 2018-11-28 MED ORDER — SODIUM CHLORIDE 0.9 % IV SOLN
1000.0000 mg | Freq: Once | INTRAVENOUS | Status: AC
  Administered 2018-11-28: 1000 mg via INTRAVENOUS
  Filled 2018-11-28: qty 40

## 2018-11-28 MED ORDER — ACETAMINOPHEN 325 MG PO TABS
ORAL_TABLET | ORAL | Status: AC
  Filled 2018-11-28: qty 2

## 2018-11-28 MED ORDER — HEPARIN SOD (PORK) LOCK FLUSH 100 UNIT/ML IV SOLN
500.0000 [IU] | Freq: Once | INTRAVENOUS | Status: AC | PRN
  Administered 2018-11-28: 500 [IU]
  Filled 2018-11-28: qty 5

## 2018-11-28 MED ORDER — DIPHENHYDRAMINE HCL 50 MG/ML IJ SOLN
INTRAMUSCULAR | Status: AC
  Filled 2018-11-28: qty 1

## 2018-11-28 MED ORDER — DEXAMETHASONE SODIUM PHOSPHATE 10 MG/ML IJ SOLN
INTRAMUSCULAR | Status: AC
  Filled 2018-11-28: qty 1

## 2018-11-28 MED ORDER — ACETAMINOPHEN 325 MG PO TABS
650.0000 mg | ORAL_TABLET | Freq: Once | ORAL | Status: AC
  Administered 2018-11-28: 650 mg via ORAL

## 2018-11-28 MED ORDER — DEXAMETHASONE SODIUM PHOSPHATE 10 MG/ML IJ SOLN
10.0000 mg | Freq: Once | INTRAMUSCULAR | Status: AC
  Administered 2018-11-28: 10 mg via INTRAVENOUS

## 2018-11-28 MED ORDER — SODIUM CHLORIDE 0.9 % IV SOLN
Freq: Once | INTRAVENOUS | Status: AC
  Administered 2018-11-28: 08:00:00 via INTRAVENOUS
  Filled 2018-11-28: qty 250

## 2018-11-28 MED ORDER — DIPHENHYDRAMINE HCL 50 MG/ML IJ SOLN
50.0000 mg | Freq: Once | INTRAMUSCULAR | Status: AC
  Administered 2018-11-28: 50 mg via INTRAVENOUS

## 2018-11-28 MED ORDER — SODIUM CHLORIDE 0.9% FLUSH
10.0000 mL | INTRAVENOUS | Status: DC | PRN
  Administered 2018-11-28: 10 mL
  Filled 2018-11-28: qty 10

## 2018-11-28 NOTE — Patient Instructions (Signed)
[facility] Discharge Instructions for Patients Receiving Chemotherapy  Today you received the following chemotherapy agents Obinutuzumab (GAZYVA).  To help prevent nausea and vomiting after your treatment, we encourage you to take your nausea medication as prescribed.   If you develop nausea and vomiting that is not controlled by your nausea medication, call the clinic.   BELOW ARE SYMPTOMS THAT SHOULD BE REPORTED IMMEDIATELY:  *FEVER GREATER THAN 100.5 F  *CHILLS WITH OR WITHOUT FEVER  NAUSEA AND VOMITING THAT IS NOT CONTROLLED WITH YOUR NAUSEA MEDICATION  *UNUSUAL SHORTNESS OF BREATH  *UNUSUAL BRUISING OR BLEEDING  TENDERNESS IN MOUTH AND THROAT WITH OR WITHOUT PRESENCE OF ULCERS  *URINARY PROBLEMS  *BOWEL PROBLEMS  UNUSUAL RASH Items with * indicate a potential emergency and should be followed up as soon as possible.  Feel free to call the clinic should you have any questions or concerns. The clinic phone number is [phone].  Please show the CHEMO ALERT CARD at check-in to the Emergency Department and triage nurse.   

## 2019-01-03 ENCOUNTER — Telehealth: Payer: Self-pay

## 2019-01-03 NOTE — Telephone Encounter (Signed)
Called and left below message. Authorization is good. Ask her to call the office back for questions.

## 2019-01-03 NOTE — Telephone Encounter (Signed)
-----   Message from Heath Lark, MD sent at 01/03/2019  7:22 AM EDT ----- Regarding: RE: niece wants to know if OK to move appts OK to move everything 1 month out Please send scheduling msg and make sure CT prior auth is still valid for next month ----- Message ----- From: Paulla Dolly, RN Sent: 01/02/2019   2:12 PM EDT To: Heath Lark, MD Subject: niece wants to know if OK to move appts        Pt's niece, Olivia Mackie, called and is concerned about bringing pt out given current situation. Pt scheduled for labs and CT scan on 3/23 and see you on 3/24. Olivia Mackie wants to reschedule all these. Please advise.

## 2019-01-04 ENCOUNTER — Telehealth: Payer: Self-pay | Admitting: Hematology and Oncology

## 2019-01-04 NOTE — Telephone Encounter (Signed)
Per 3/19 sch message - left message for patient to call back to r/s

## 2019-01-04 NOTE — Telephone Encounter (Signed)
Scheduling message sent to reschedule appts.

## 2019-01-04 NOTE — Telephone Encounter (Signed)
Called and left a message for Olivia Mackie to call the office regarding upcoming appts.

## 2019-01-08 ENCOUNTER — Other Ambulatory Visit: Payer: Medicare Other

## 2019-01-08 ENCOUNTER — Ambulatory Visit (HOSPITAL_COMMUNITY): Payer: Medicare Other

## 2019-01-09 ENCOUNTER — Ambulatory Visit: Payer: Medicare Other | Admitting: Hematology and Oncology

## 2019-02-01 ENCOUNTER — Telehealth: Payer: Self-pay

## 2019-02-01 NOTE — Telephone Encounter (Signed)
Called [place] regarding upcoming scan and appts on 4/22. She would like to cancel all appts due to the pandemic. After discussing with other family members they are going to delay appts. Olivia Mackie will call back when they are ready to reschedule.

## 2019-02-07 ENCOUNTER — Ambulatory Visit (HOSPITAL_COMMUNITY): Admission: RE | Admit: 2019-02-07 | Payer: Medicare Other | Source: Ambulatory Visit

## 2019-02-07 ENCOUNTER — Other Ambulatory Visit: Payer: Medicare Other

## 2019-02-08 ENCOUNTER — Ambulatory Visit: Payer: Medicare Other | Admitting: Hematology and Oncology

## 2019-02-15 ENCOUNTER — Ambulatory Visit: Payer: Medicare Other | Admitting: Hematology and Oncology

## 2019-02-26 ENCOUNTER — Telehealth: Payer: Self-pay

## 2019-02-26 NOTE — Telephone Encounter (Signed)
Erin Moore called and left a message to call her. Her Aunt is sleeping a lot and feeling tired.  Called back. She is requesting appt. Concerned about the no visitors policy, she is worried her Erin Moore will not understand. Erin Moore ask if Dr. Alvy Bimler can call her when she see's Erin Moore? She is requesting appt any day this week.

## 2019-02-27 NOTE — Telephone Encounter (Signed)
Erin Moore returned call. She confirms appointments on Thursday.

## 2019-02-27 NOTE — Telephone Encounter (Signed)
Left message for Erin Moore to return call. Scheduled lab at 1115 and OV at 12.

## 2019-02-27 NOTE — Telephone Encounter (Signed)
I can see her Thursday around 12pm Please call Erin Moore and ask for a cell phone to be with the patient Labs and see me 15 mins

## 2019-03-01 ENCOUNTER — Other Ambulatory Visit: Payer: Self-pay

## 2019-03-01 ENCOUNTER — Other Ambulatory Visit: Payer: Self-pay | Admitting: Hematology and Oncology

## 2019-03-01 ENCOUNTER — Telehealth: Payer: Self-pay

## 2019-03-01 ENCOUNTER — Encounter: Payer: Self-pay | Admitting: Hematology and Oncology

## 2019-03-01 ENCOUNTER — Inpatient Hospital Stay (HOSPITAL_BASED_OUTPATIENT_CLINIC_OR_DEPARTMENT_OTHER): Payer: Medicare Other | Admitting: Hematology and Oncology

## 2019-03-01 ENCOUNTER — Inpatient Hospital Stay: Payer: Medicare Other | Attending: Hematology and Oncology

## 2019-03-01 DIAGNOSIS — C9110 Chronic lymphocytic leukemia of B-cell type not having achieved remission: Secondary | ICD-10-CM | POA: Insufficient documentation

## 2019-03-01 DIAGNOSIS — E538 Deficiency of other specified B group vitamins: Secondary | ICD-10-CM | POA: Insufficient documentation

## 2019-03-01 DIAGNOSIS — R5383 Other fatigue: Secondary | ICD-10-CM

## 2019-03-01 DIAGNOSIS — Z79899 Other long term (current) drug therapy: Secondary | ICD-10-CM | POA: Insufficient documentation

## 2019-03-01 LAB — CBC WITH DIFFERENTIAL/PLATELET
Abs Immature Granulocytes: 0.18 10*3/uL — ABNORMAL HIGH (ref 0.00–0.07)
Basophils Absolute: 0.1 10*3/uL (ref 0.0–0.1)
Basophils Relative: 1 %
Eosinophils Absolute: 0.3 10*3/uL (ref 0.0–0.5)
Eosinophils Relative: 3 %
HCT: 41 % (ref 36.0–46.0)
Hemoglobin: 13.2 g/dL (ref 12.0–15.0)
Immature Granulocytes: 2 %
Lymphocytes Relative: 6 %
Lymphs Abs: 0.5 10*3/uL — ABNORMAL LOW (ref 0.7–4.0)
MCH: 27.2 pg (ref 26.0–34.0)
MCHC: 32.2 g/dL (ref 30.0–36.0)
MCV: 84.4 fL (ref 80.0–100.0)
Monocytes Absolute: 0.8 10*3/uL (ref 0.1–1.0)
Monocytes Relative: 9 %
Neutro Abs: 7.1 10*3/uL (ref 1.7–7.7)
Neutrophils Relative %: 79 %
Platelets: 165 10*3/uL (ref 150–400)
RBC: 4.86 MIL/uL (ref 3.87–5.11)
RDW: 13.9 % (ref 11.5–15.5)
WBC: 9 10*3/uL (ref 4.0–10.5)
nRBC: 0 % (ref 0.0–0.2)

## 2019-03-01 LAB — COMPREHENSIVE METABOLIC PANEL
ALT: 19 U/L (ref 0–44)
AST: 14 U/L — ABNORMAL LOW (ref 15–41)
Albumin: 3.8 g/dL (ref 3.5–5.0)
Alkaline Phosphatase: 89 U/L (ref 38–126)
Anion gap: 11 (ref 5–15)
BUN: 18 mg/dL (ref 8–23)
CO2: 30 mmol/L (ref 22–32)
Calcium: 9.4 mg/dL (ref 8.9–10.3)
Chloride: 100 mmol/L (ref 98–111)
Creatinine, Ser: 1.28 mg/dL — ABNORMAL HIGH (ref 0.44–1.00)
GFR calc Af Amer: 48 mL/min — ABNORMAL LOW (ref >60)
GFR calc non Af Amer: 41 mL/min — ABNORMAL LOW (ref >60)
Glucose, Bld: 97 mg/dL (ref 70–99)
Potassium: 3.6 mmol/L (ref 3.5–5.1)
Sodium: 141 mmol/L (ref 135–145)
Total Bilirubin: 0.4 mg/dL (ref 0.3–1.2)
Total Protein: 6.2 g/dL — ABNORMAL LOW (ref 6.5–8.1)

## 2019-03-01 LAB — TSH: TSH: 3.476 u[IU]/mL (ref 0.308–3.960)

## 2019-03-01 LAB — T4, FREE: Free T4: 0.57 ng/dL — ABNORMAL LOW (ref 0.82–1.77)

## 2019-03-01 LAB — VITAMIN B12: Vitamin B-12: 303 pg/mL (ref 180–914)

## 2019-03-01 NOTE — Assessment & Plan Note (Signed)
She has not received treatment for some time CBC today is normal with no evidence of lymphocytosis and resolution of prior thrombocytopenia Her caregiver is concerned about risk of infection with treatment during the pandemic We discussed the risk and benefits of ordering imaging study and resumption of treatment For now, she favors observation I will call her next month for further follow-up

## 2019-03-01 NOTE — Progress Notes (Signed)
E[facility] OFFICE PROGRESS NOTE  Patient Care Team: Jonathon Jordan, MD as PCP - General (Family Medicine)  ASSESSMENT & PLAN:  CLL (chronic lymphocytic leukemia) ([place]) She has not received treatment for some time CBC today is normal with no evidence of lymphocytosis and resolution of prior thrombocytopenia Her caregiver is concerned about risk of infection with treatment during the pandemic We discussed the risk and benefits of ordering imaging study and resumption of treatment For now, she favors observation I will call her next month for further follow-up  Other fatigue The patient is concerned about excessive fatigue Thyroid function test is within normal limits I suspect her fatigue is due to poor sleep from obstructive sleep apnea  Vitamin B12 deficiency She had history of borderline B12 deficiency Recheck serum B12 level is adequate   No orders of the defined types were placed in this encounter.   INTERVAL HISTORY: Please see below for problem oriented charting. She returns for further follow-up and evaluation of excessive fatigue She had cancel her recent treatment and CT imaging due to concern about the pandemic The patient complained of excessive fatigue I spoke with her caregiver and according to her, her CPAP machine broke recently but she was able to find a replacement She has some chronic sinus congestion which is stable No recent fever or chills No new lymphadenopathy  SUMMARY OF ONCOLOGIC HISTORY: Oncology History   FISH del 13 q     CLL (chronic lymphocytic leukemia) ([place])   07/02/2010 Procedure    LN biopsy confirmed CLL    08/18/2010 Procedure    Bone marrow biopsy confirmed CLL    11/18/2010 - 04/15/2011 Chemotherapy    patient received 6 cycles of FLudarabine and Rituximab    01/13/2012 Relapse/Recurrence    Repeat BM biopsy confirmed relapse    01/27/2012 - 06/23/2012 Chemotherapy    Bendamustine & Rituximab given, complicated by  infusion reaction to Rituximab, completed 6 cycles    06/29/2013 Imaging    Ct scan confirmed disease relapse with bulky lymphadenopathy    07/23/2013 Procedure    Repeat BM biopsy due to thrombocytopenia and progressive leukocytosis    08/02/2013 Relapse/Recurrence    Patient consented to start iburitinib as salvage therapy for relapsed CLL    11/09/2013 Imaging    Repeat CT scan the chest, abdomen and pelvis showed greater than 50% response to treatment    06/17/2014 Imaging    Repeat CT scan showed near complete response to treatment.    05/30/2015 Imaging    Repeat CT scan showed near complete response to treatment.    06/14/2016 Imaging    CT scan showed stable scattered small retroperitoneal lymph nodes and pelvic lymph nodes. No findings for recurrent or progressive lymphoma. Stable mild splenomegaly.    12/30/2017 Imaging    1. Recurrent adenopathy within the chest, abdomen, and pelvis. 2. Splenomegaly. 3. Aortic Atherosclerosis (ICD10-I70.0). 4. Coronary artery calcifications.    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 The catheter is ready for immediate use.    01/24/2018 -  Chemotherapy    The patient had Gazyva and 1 dose of bendamustine. Bendamustine was discontinued due to inability to get insurance approval    05/08/2018 Imaging    1. Interval partial treatment response. Bilateral axillary, mediastinal, right hilar, retroperitoneal and bilateral pelvic adenopathy  is all decreased. Mild splenomegaly, decreased. No new or progressive disease. 2. Aortic Atherosclerosis (ICD10-I70.0).     REVIEW OF SYSTEMS:   Constitutional: Denies fevers, chills or abnormal weight loss Eyes: Denies blurriness of vision Ears, nose, mouth, throat, and face: Denies mucositis or sore throat Respiratory: Denies cough, dyspnea or  wheezes Cardiovascular: Denies palpitation, chest discomfort or lower extremity swelling Gastrointestinal:  Denies nausea, heartburn or change in bowel habits Skin: Denies abnormal skin rashes Lymphatics: Denies new lymphadenopathy or easy bruising Neurological:Denies numbness, tingling or new weaknesses Behavioral/Psych: Mood is stable, no new changes  All other systems were reviewed with the patient and are negative.  I have reviewed the past medical history, past surgical history, social history and family history with the patient and they are unchanged from previous note.  ALLERGIES:  is allergic to azithromycin and ibuprofen [ibuprofen].  MEDICATIONS:  Current Outpatient Medications  Medication Sig Dispense Refill  . acyclovir (ZOVIRAX) 400 MG tablet Take 1 tablet (400 mg total) by mouth daily. 30 tablet 5  . allopurinol (ZYLOPRIM) 300 MG tablet Take 1 tablet (300 mg total) by mouth daily. 30 tablet 2  . amLODipine (NORVASC) 10 MG tablet TAKE 1 TABLET (10 MG TOTAL) BY MOUTH DAILY. 90 tablet 1  . azelastine (ASTELIN) 0.1 % nasal spray Place 1 spray into both nostrils 2 (two) times daily. Use in each nostril as directed    . bisoprolol (ZEBETA) 5 MG tablet   10  . Carboxymethylcellul-Glycerin (REFRESH OPTIVE SENSITIVE OP) Place 1 drop into both eyes as needed.     . citalopram (CELEXA) 20 MG tablet Take 20 mg by mouth every morning.     . fluticasone (FLONASE) 50 MCG/ACT nasal spray Place 1 spray into both nostrils daily.    . fluticasone-salmeterol (ADVAIR HFA) 115-21 MCG/ACT inhaler Inhale 2 puffs into the lungs 2 (two) times daily.    Marland Kitchen lidocaine-prilocaine (EMLA) cream Apply to affected area once 30 g 5  . loratadine (CLARITIN) 10 MG tablet Take 10 mg daily by mouth.    . losartan-hydrochlorothiazide (HYZAAR) 100-25 MG tablet   9  . Multiple Vitamins-Minerals (PRESERVISION AREDS 2) CAPS Take 1 capsule by mouth 2 (two) times daily.    . ondansetron (ZOFRAN) 8 MG tablet Take 1  tablet (8 mg total) by mouth every 8 (eight) hours as needed. Then take as needed for nausea or vomiting. 30 tablet 1  . prochlorperazine (COMPAZINE) 10 MG tablet Take 1 tablet (10 mg total) by mouth every 6 (six) hours as needed (Nausea or vomiting). 30 tablet 1  . pseudoephedrine-guaifenesin (MUCINEX D) 60-600 MG 12 hr tablet Take 1 tablet by mouth every 12 (twelve) hours.    Marland Kitchen tiZANidine (ZANAFLEX) 2 MG tablet Take by mouth every 6 (six) hours as needed for muscle spasms.    Marland Kitchen triamcinolone (NASACORT) 55 MCG/ACT AERO nasal inhaler Place 2 sprays into the nose daily. 1 Inhaler 12  . Vitamin D, Ergocalciferol, 2000 units CAPS Take by mouth.     No current facility-administered medications for this visit.     PHYSICAL EXAMINATION: ECOG PERFORMANCE STATUS: 1 - Symptomatic but completely ambulatory  Vitals:   03/01/19 1124  BP: (!) 145/59  Pulse: 65  Resp: 18  Temp: 99.1 F (37.3 C)  SpO2: 96%   Filed Weights   03/01/19 1124  Weight: 283 lb 3.2 oz (128.5 kg)    GENERAL:alert, no distress and comfortable SKIN: skin color, texture, turgor are normal, no rashes or significant lesions EYES:  normal, Conjunctiva are pink and non-injected, sclera clear OROPHARYNX:no exudate, no erythema and lips, buccal mucosa, and tongue normal  NECK: supple, thyroid normal size, non-tender, without nodularity LYMPH:  no palpable lymphadenopathy in the cervical, axillary or inguinal LUNGS: clear to auscultation and percussion with normal breathing effort HEART: regular rate & rhythm and no murmurs and no lower extremity edema ABDOMEN:abdomen soft, non-tender and normal bowel sounds Musculoskeletal:no cyanosis of digits and no clubbing  NEURO: alert & oriented x 3 with fluent speech, no focal motor/sensory deficits  LABORATORY DATA:  I have reviewed the data as listed    Component Value Date/Time   NA 141 03/01/2019 1102   NA 141 08/23/2017 1026   K 3.6 03/01/2019 1102   K 3.7 08/23/2017 1026    CL 100 03/01/2019 1102   CL 98 02/02/2013 1353   CO2 30 03/01/2019 1102   CO2 33 (H) 08/23/2017 1026   GLUCOSE 97 03/01/2019 1102   GLUCOSE 92 08/23/2017 1026   GLUCOSE 99 02/02/2013 1353   BUN 18 03/01/2019 1102   BUN 21.0 08/23/2017 1026   CREATININE 1.28 (H) 03/01/2019 1102   CREATININE 1.25 (H) 11/27/2018 1127   CREATININE 1.2 (H) 08/23/2017 1026   CALCIUM 9.4 03/01/2019 1102   CALCIUM 9.3 08/23/2017 1026   PROT 6.2 (L) 03/01/2019 1102   PROT 6.3 (L) 08/23/2017 1026   ALBUMIN 3.8 03/01/2019 1102   ALBUMIN 3.8 08/23/2017 1026   AST 14 (L) 03/01/2019 1102   AST 15 11/27/2018 1127   AST 11 08/23/2017 1026   ALT 19 03/01/2019 1102   ALT 20 11/27/2018 1127   ALT 10 08/23/2017 1026   ALKPHOS 89 03/01/2019 1102   ALKPHOS 66 08/23/2017 1026   BILITOT 0.4 03/01/2019 1102   BILITOT 0.4 11/27/2018 1127   BILITOT 0.59 08/23/2017 1026   GFRNONAA 41 (L) 03/01/2019 1102   GFRNONAA 43 (L) 11/27/2018 1127   GFRAA 48 (L) 03/01/2019 1102   GFRAA 50 (L) 11/27/2018 1127    No results found for: SPEP, UPEP  Lab Results  Component Value Date   WBC 9.0 03/01/2019   NEUTROABS 7.1 03/01/2019   HGB 13.2 03/01/2019   HCT 41.0 03/01/2019   MCV 84.4 03/01/2019   PLT 165 03/01/2019      Chemistry      Component Value Date/Time   NA 141 03/01/2019 1102   NA 141 08/23/2017 1026   K 3.6 03/01/2019 1102   K 3.7 08/23/2017 1026   CL 100 03/01/2019 1102   CL 98 02/02/2013 1353   CO2 30 03/01/2019 1102   CO2 33 (H) 08/23/2017 1026   BUN 18 03/01/2019 1102   BUN 21.0 08/23/2017 1026   CREATININE 1.28 (H) 03/01/2019 1102   CREATININE 1.25 (H) 11/27/2018 1127   CREATININE 1.2 (H) 08/23/2017 1026      Component Value Date/Time   CALCIUM 9.4 03/01/2019 1102   CALCIUM 9.3 08/23/2017 1026   ALKPHOS 89 03/01/2019 1102   ALKPHOS 66 08/23/2017 1026   AST 14 (L) 03/01/2019 1102   AST 15 11/27/2018 1127   AST 11 08/23/2017 1026   ALT 19 03/01/2019 1102   ALT 20 11/27/2018 1127   ALT 10  08/23/2017 1026   BILITOT 0.4 03/01/2019 1102   BILITOT 0.4 11/27/2018 1127   BILITOT 0.59 08/23/2017 1026       All questions were answered. The patient knows to call the clinic with any problems, questions or concerns. No barriers to learning was detected.  I spent 15 minutes counseling the patient face to face. The total time spent in the appointment was 20 minutes and more than 50% was on counseling and review of test results  Heath Lark, MD 03/01/2019 2:51 PM

## 2019-03-01 NOTE — Telephone Encounter (Signed)
Called Tracy per Dr. Alvy Bimler and told CMP, thyroid and B12 are normal. She verbalized understanding. Instructed to call office if needed.

## 2019-03-01 NOTE — Assessment & Plan Note (Addendum)
The patient is concerned about excessive fatigue Thyroid function test is within normal limits I suspect her fatigue is due to poor sleep from obstructive sleep apnea

## 2019-03-01 NOTE — Assessment & Plan Note (Signed)
She had history of borderline B12 deficiency Recheck serum B12 level is adequate

## 2019-03-07 IMAGING — XA IR US GUIDE VASC ACCESS RIGHT
1 series · 1 of 1 positions shown · non-contrast
Comparison: None.

INDICATION: History of CLL, in need of durable intravenous access for
chemotherapy administration.

EXAM:
IMPLANTED PORT A CATH PLACEMENT WITH ULTRASOUND AND FLUOROSCOPIC
GUIDANCE

[Series 300: line placements · 1 of 1 slices shown]
[im 1/1]
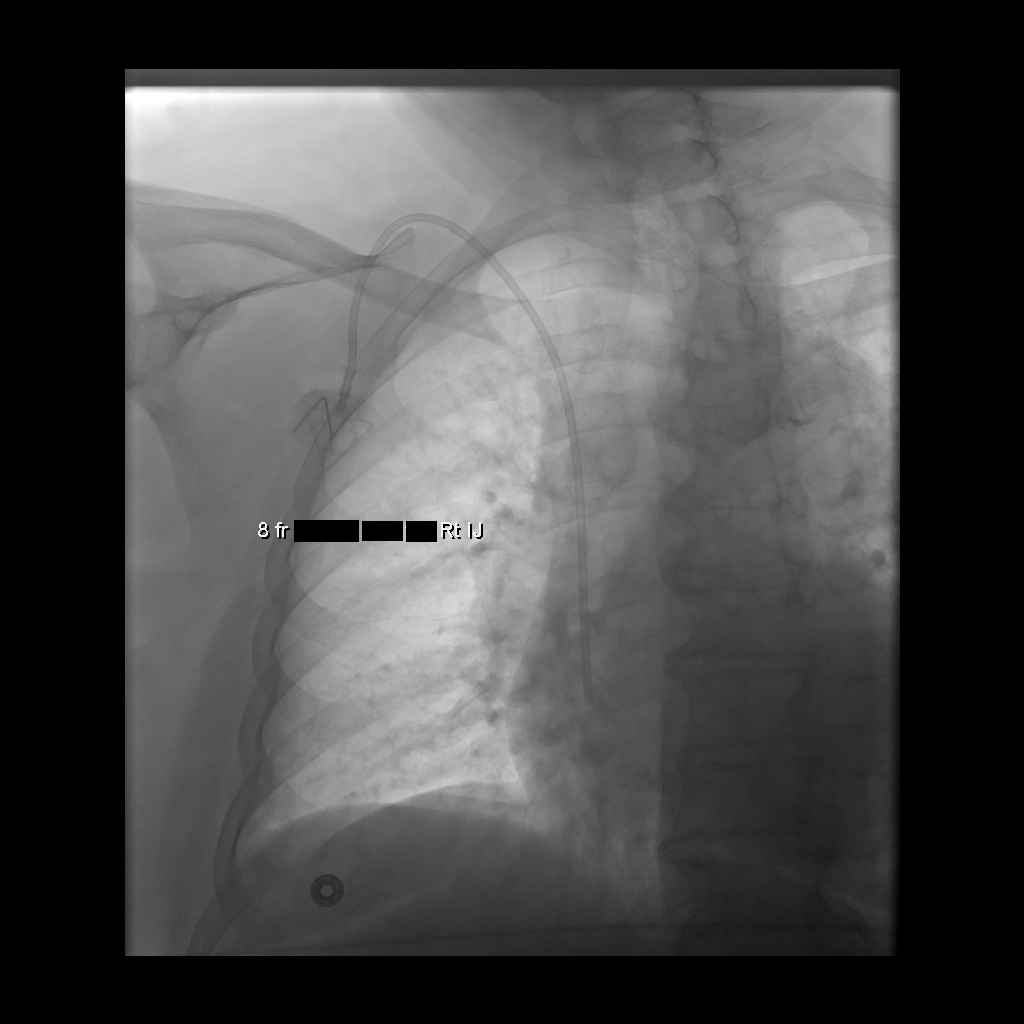

[1 of 1 positions shown; findings below may reference images not displayed]

MEDICATIONS:
Ancef 2 gm IV; The antibiotic was administered within an appropriate
time interval prior to skin puncture.

ANESTHESIA/SEDATION:
Moderate (conscious) sedation was employed during this procedure. A
total of Versed 2 mg and Fentanyl 100 mcg was administered
intravenously.

Moderate Sedation Time: 28 minutes. The patient's level of
consciousness and vital signs were monitored continuously by
radiology nursing throughout the procedure under my direct
supervision.

CONTRAST:  None

FLUOROSCOPY TIME:  18 seconds (12 mGy)

COMPLICATIONS:
None immediate.

PROCEDURE:
The procedure, risks, benefits, and alternatives were explained to
the patient. Questions regarding the procedure were encouraged and
answered. The patient understands and consents to the procedure.

The right neck and chest were prepped with chlorhexidine in a
sterile fashion, and a sterile drape was applied covering the
operative field. Maximum barrier sterile technique with sterile
gowns and gloves were used for the procedure. A timeout was
performed prior to the initiation of the procedure. Local anesthesia
was provided with 1% lidocaine with epinephrine.

After creating a small venotomy incision, a micropuncture kit was
utilized to access the internal jugular vein. Real-time ultrasound
guidance was utilized for vascular access including the acquisition
of a permanent ultrasound image documenting patency of the accessed
vessel. The microwire was utilized to measure appropriate catheter
length.

A subcutaneous port pocket was then created along the upper chest
wall utilizing a combination of sharp and blunt dissection. The
pocket was irrigated with sterile saline. A single lumen power
injectable port was chosen for placement. The 8 Fr catheter was
tunneled from the port pocket site to the venotomy incision. The
port was placed in the pocket. The external catheter was trimmed to
appropriate length. At the venotomy, an 8 Fr peel-away sheath was
placed over a guidewire under fluoroscopic guidance. The catheter
was then placed through the sheath and the sheath was removed. Final
catheter positioning was confirmed and documented with a
fluoroscopic spot radiograph. The port was accessed with Myrna Basta
needle, aspirated and flushed with heparinized saline.

The venotomy site was closed with an interrupted 4-0 Vicryl suture.
The port pocket incision was closed with interrupted 2-0 Vicryl
suture and the skin was opposed with a running subcuticular 4-0
Vicryl suture. Dermabond and Ferienhaus were applied to both
incisions. Dressings were placed. The patient tolerated the
procedure well without immediate post procedural complication.
FINDINGS: After catheter placement, the tip lies within the superior
cavoatrial junction. The catheter aspirates and flushes normally and
is ready for immediate use.
IMPRESSION: Successful placement of a right internal jugular approach power
injectable Port-A-Cath. The catheter is ready for immediate use.

## 2019-04-04 ENCOUNTER — Telehealth: Payer: Self-pay | Admitting: *Deleted

## 2019-04-04 NOTE — Telephone Encounter (Signed)
-----   Message from Heath Lark, MD sent at 04/04/2019  8:14 AM EDT ----- Regarding: call caregiver Erin Moore Can you call Erin Moore and ask how is the patient doing? We have no appointment scheduled

## 2019-04-04 NOTE — Telephone Encounter (Signed)
Telephone call to Erin Moore- patient is doing very well. She has a bunch of energy and is eating well. She is bored with the current conditions. Erin Moore does not feel it is necessary to come in for a visit at this time. She will call the clinic if she feels a follow up is warranted.

## 2019-04-30 ENCOUNTER — Other Ambulatory Visit: Payer: Self-pay | Admitting: *Deleted

## 2019-04-30 MED ORDER — TRIAMCINOLONE ACETONIDE 55 MCG/ACT NA AERO: 2.0000 | INHALATION_SPRAY | Freq: Every day | NASAL | 12 refills | Status: DC

## 2019-07-16 ENCOUNTER — Other Ambulatory Visit: Payer: Self-pay | Admitting: Family Medicine

## 2019-07-16 DIAGNOSIS — Z1231 Encounter for screening mammogram for malignant neoplasm of breast: Secondary | ICD-10-CM

## 2019-08-29 ENCOUNTER — Ambulatory Visit: Payer: Medicare Other

## 2019-08-29 ENCOUNTER — Other Ambulatory Visit: Payer: Self-pay

## 2019-08-29 ENCOUNTER — Ambulatory Visit
Admission: RE | Admit: 2019-08-29 | Discharge: 2019-08-29 | Disposition: A | Discharge status: Home / Self Care | Payer: Medicare Other | Source: Ambulatory Visit | Attending: Family Medicine | Admitting: Family Medicine

## 2019-08-29 DIAGNOSIS — Z1231 Encounter for screening mammogram for malignant neoplasm of breast: Secondary | ICD-10-CM

## 2019-10-11 DIAGNOSIS — E559 Vitamin D deficiency, unspecified: Secondary | ICD-10-CM | POA: Diagnosis not present

## 2019-10-11 DIAGNOSIS — E782 Mixed hyperlipidemia: Secondary | ICD-10-CM | POA: Diagnosis not present

## 2019-10-11 DIAGNOSIS — I10 Essential (primary) hypertension: Secondary | ICD-10-CM | POA: Diagnosis not present

## 2019-10-11 DIAGNOSIS — R739 Hyperglycemia, unspecified: Secondary | ICD-10-CM | POA: Diagnosis not present

## 2019-10-11 DIAGNOSIS — Z0000 Encounter for general adult medical examination without abnormal findings: Secondary | ICD-10-CM | POA: Diagnosis not present

## 2019-10-11 DIAGNOSIS — Z79899 Other long term (current) drug therapy: Secondary | ICD-10-CM | POA: Diagnosis not present

## 2019-10-11 DIAGNOSIS — E039 Hypothyroidism, unspecified: Secondary | ICD-10-CM | POA: Diagnosis not present

## 2019-12-28 DIAGNOSIS — R2681 Unsteadiness on feet: Secondary | ICD-10-CM | POA: Diagnosis not present

## 2019-12-28 DIAGNOSIS — R262 Difficulty in walking, not elsewhere classified: Secondary | ICD-10-CM | POA: Diagnosis not present

## 2019-12-28 DIAGNOSIS — M6281 Muscle weakness (generalized): Secondary | ICD-10-CM | POA: Diagnosis not present

## 2020-01-01 DIAGNOSIS — R262 Difficulty in walking, not elsewhere classified: Secondary | ICD-10-CM | POA: Diagnosis not present

## 2020-01-01 DIAGNOSIS — H04123 Dry eye syndrome of bilateral lacrimal glands: Secondary | ICD-10-CM | POA: Diagnosis not present

## 2020-01-01 DIAGNOSIS — R2681 Unsteadiness on feet: Secondary | ICD-10-CM | POA: Diagnosis not present

## 2020-01-01 DIAGNOSIS — M6281 Muscle weakness (generalized): Secondary | ICD-10-CM | POA: Diagnosis not present

## 2020-01-01 DIAGNOSIS — D3131 Benign neoplasm of right choroid: Secondary | ICD-10-CM | POA: Diagnosis not present

## 2020-01-03 DIAGNOSIS — R262 Difficulty in walking, not elsewhere classified: Secondary | ICD-10-CM | POA: Diagnosis not present

## 2020-01-03 DIAGNOSIS — R2681 Unsteadiness on feet: Secondary | ICD-10-CM | POA: Diagnosis not present

## 2020-01-03 DIAGNOSIS — M6281 Muscle weakness (generalized): Secondary | ICD-10-CM | POA: Diagnosis not present

## 2020-01-04 DIAGNOSIS — R262 Difficulty in walking, not elsewhere classified: Secondary | ICD-10-CM | POA: Diagnosis not present

## 2020-01-04 DIAGNOSIS — R2681 Unsteadiness on feet: Secondary | ICD-10-CM | POA: Diagnosis not present

## 2020-01-04 DIAGNOSIS — M6281 Muscle weakness (generalized): Secondary | ICD-10-CM | POA: Diagnosis not present

## 2020-01-07 DIAGNOSIS — M6281 Muscle weakness (generalized): Secondary | ICD-10-CM | POA: Diagnosis not present

## 2020-01-07 DIAGNOSIS — R262 Difficulty in walking, not elsewhere classified: Secondary | ICD-10-CM | POA: Diagnosis not present

## 2020-01-07 DIAGNOSIS — R2681 Unsteadiness on feet: Secondary | ICD-10-CM | POA: Diagnosis not present

## 2020-01-09 ENCOUNTER — Telehealth: Payer: Self-pay

## 2020-01-09 DIAGNOSIS — M6281 Muscle weakness (generalized): Secondary | ICD-10-CM | POA: Diagnosis not present

## 2020-01-09 DIAGNOSIS — R2681 Unsteadiness on feet: Secondary | ICD-10-CM | POA: Diagnosis not present

## 2020-01-09 DIAGNOSIS — R262 Difficulty in walking, not elsewhere classified: Secondary | ICD-10-CM | POA: Diagnosis not present

## 2020-01-09 NOTE — Telephone Encounter (Signed)
Per Dr. Esperanza Richters to Pt's niece left vm message to return call to Dr. Alvy Bimler office about scheduling a port flush visit and If she does not won't to do that, she can be scheduled to have the port removed to decrease the risk of blood clots or infection. Awaitng return call.

## 2020-01-09 NOTE — Telephone Encounter (Signed)
-----   Message from Heath Lark, MD sent at 01/09/2020  9:22 AM EDT ----- Regarding: pls call niece She has mental deficit She has not been seen for a while Her port has not been flushed for a long time Can you call her niece who is her MPOA? If she decline further visits, we should get the port removed due to risk of blood clot and infection

## 2020-01-10 ENCOUNTER — Telehealth: Payer: Self-pay

## 2020-01-10 NOTE — Telephone Encounter (Signed)
Sister called, Tawana Scale she is the POA now and moved back to be closer to [place]. She is taking over the care of Bethena Roys. Linus Orn gave her the message from yesterday. She would like a lab, flush and appt with Dr. Alvy Bimler. If her labs look good and Dr. Alvy Bimler thinks she should get the port removed, she is agreeable.

## 2020-01-10 NOTE — Telephone Encounter (Signed)
Pls update her chart I will send scheduler to see her back with labs and flush and we will decide what to do after that

## 2020-01-11 ENCOUNTER — Telehealth: Payer: Self-pay | Admitting: Hematology and Oncology

## 2020-01-11 DIAGNOSIS — R2681 Unsteadiness on feet: Secondary | ICD-10-CM | POA: Diagnosis not present

## 2020-01-11 DIAGNOSIS — R262 Difficulty in walking, not elsewhere classified: Secondary | ICD-10-CM | POA: Diagnosis not present

## 2020-01-11 DIAGNOSIS — M6281 Muscle weakness (generalized): Secondary | ICD-10-CM | POA: Diagnosis not present

## 2020-01-11 NOTE — Telephone Encounter (Signed)
Scheduled appt per 3/25 sch msg. Left voicmail with appt details.

## 2020-01-14 ENCOUNTER — Telehealth: Payer: Self-pay

## 2020-01-14 NOTE — Telephone Encounter (Signed)
Sister called and left a message. Requesting refill on EMLA cream and ask that it be sent to [facility].

## 2020-01-15 NOTE — Telephone Encounter (Signed)
Called and given sister below message. She verbalized understanding.

## 2020-01-15 NOTE — Telephone Encounter (Signed)
I am not sure how to send it to [facility] flush here, we can just draw labs from hand and then I can talk to her sister whether to get her port removed

## 2020-01-16 DIAGNOSIS — G620 Drug-induced polyneuropathy: Secondary | ICD-10-CM | POA: Diagnosis not present

## 2020-01-21 DIAGNOSIS — M6281 Muscle weakness (generalized): Secondary | ICD-10-CM | POA: Diagnosis not present

## 2020-01-21 DIAGNOSIS — R262 Difficulty in walking, not elsewhere classified: Secondary | ICD-10-CM | POA: Diagnosis not present

## 2020-01-21 DIAGNOSIS — R2681 Unsteadiness on feet: Secondary | ICD-10-CM | POA: Diagnosis not present

## 2020-01-22 DIAGNOSIS — R2681 Unsteadiness on feet: Secondary | ICD-10-CM | POA: Diagnosis not present

## 2020-01-22 DIAGNOSIS — R262 Difficulty in walking, not elsewhere classified: Secondary | ICD-10-CM | POA: Diagnosis not present

## 2020-01-22 DIAGNOSIS — M6281 Muscle weakness (generalized): Secondary | ICD-10-CM | POA: Diagnosis not present

## 2020-01-23 DIAGNOSIS — R2681 Unsteadiness on feet: Secondary | ICD-10-CM | POA: Diagnosis not present

## 2020-01-23 DIAGNOSIS — M6281 Muscle weakness (generalized): Secondary | ICD-10-CM | POA: Diagnosis not present

## 2020-01-23 DIAGNOSIS — R262 Difficulty in walking, not elsewhere classified: Secondary | ICD-10-CM | POA: Diagnosis not present

## 2020-01-24 ENCOUNTER — Inpatient Hospital Stay: Payer: Medicare Other

## 2020-01-24 ENCOUNTER — Other Ambulatory Visit: Payer: Medicare Other

## 2020-01-24 ENCOUNTER — Other Ambulatory Visit: Payer: Self-pay | Admitting: Hematology and Oncology

## 2020-01-24 ENCOUNTER — Other Ambulatory Visit: Payer: Self-pay

## 2020-01-24 ENCOUNTER — Encounter: Payer: Self-pay | Admitting: Hematology and Oncology

## 2020-01-24 ENCOUNTER — Inpatient Hospital Stay: Payer: Medicare Other | Attending: Hematology and Oncology | Admitting: Hematology and Oncology

## 2020-01-24 ENCOUNTER — Telehealth: Payer: Self-pay | Admitting: Hematology and Oncology

## 2020-01-24 VITALS — BP 126/52 | HR 66 | Temp 98.7°F | Resp 18 | Ht 63.0 in | Wt 266.2 lb

## 2020-01-24 DIAGNOSIS — Z7189 Other specified counseling: Secondary | ICD-10-CM

## 2020-01-24 DIAGNOSIS — Z79899 Other long term (current) drug therapy: Secondary | ICD-10-CM | POA: Diagnosis not present

## 2020-01-24 DIAGNOSIS — W19XXXA Unspecified fall, initial encounter: Secondary | ICD-10-CM | POA: Diagnosis not present

## 2020-01-24 DIAGNOSIS — Y92009 Unspecified place in unspecified non-institutional (private) residence as the place of occurrence of the external cause: Secondary | ICD-10-CM | POA: Diagnosis not present

## 2020-01-24 DIAGNOSIS — C9110 Chronic lymphocytic leukemia of B-cell type not having achieved remission: Secondary | ICD-10-CM

## 2020-01-24 DIAGNOSIS — R5382 Chronic fatigue, unspecified: Secondary | ICD-10-CM | POA: Diagnosis not present

## 2020-01-24 DIAGNOSIS — Z5111 Encounter for antineoplastic chemotherapy: Secondary | ICD-10-CM | POA: Diagnosis not present

## 2020-01-24 DIAGNOSIS — Z9181 History of falling: Secondary | ICD-10-CM | POA: Insufficient documentation

## 2020-01-24 LAB — CBC WITH DIFFERENTIAL/PLATELET
Abs Immature Granulocytes: 0.69 10*3/uL — ABNORMAL HIGH (ref 0.00–0.07)
Basophils Absolute: 0.2 10*3/uL — ABNORMAL HIGH (ref 0.0–0.1)
Basophils Relative: 0 %
Eosinophils Absolute: 0.4 10*3/uL (ref 0.0–0.5)
Eosinophils Relative: 0 %
HCT: 39.1 % (ref 36.0–46.0)
Hemoglobin: 11.8 g/dL — ABNORMAL LOW (ref 12.0–15.0)
Immature Granulocytes: 1 %
Lymphocytes Relative: 86 %
Lymphs Abs: 100.8 10*3/uL — ABNORMAL HIGH (ref 0.7–4.0)
MCH: 26.9 pg (ref 26.0–34.0)
MCHC: 30.2 g/dL (ref 30.0–36.0)
MCV: 89.3 fL (ref 80.0–100.0)
Monocytes Absolute: 7.2 10*3/uL — ABNORMAL HIGH (ref 0.1–1.0)
Monocytes Relative: 6 %
Neutro Abs: 7.8 10*3/uL — ABNORMAL HIGH (ref 1.7–7.7)
Neutrophils Relative %: 7 %
Platelets: 159 10*3/uL (ref 150–400)
RBC: 4.38 MIL/uL (ref 3.87–5.11)
RDW: 15.6 % — ABNORMAL HIGH (ref 11.5–15.5)
WBC: 117 10*3/uL (ref 4.0–10.5)
nRBC: 0 % (ref 0.0–0.2)

## 2020-01-24 LAB — COMPREHENSIVE METABOLIC PANEL
ALT: 12 U/L (ref 0–44)
AST: 16 U/L (ref 15–41)
Albumin: 4 g/dL (ref 3.5–5.0)
Alkaline Phosphatase: 104 U/L (ref 38–126)
Anion gap: 7 (ref 5–15)
BUN: 25 mg/dL — ABNORMAL HIGH (ref 8–23)
CO2: 31 mmol/L (ref 22–32)
Calcium: 9.4 mg/dL (ref 8.9–10.3)
Chloride: 103 mmol/L (ref 98–111)
Creatinine, Ser: 1.3 mg/dL — ABNORMAL HIGH (ref 0.44–1.00)
GFR calc Af Amer: 47 mL/min — ABNORMAL LOW (ref >60)
GFR calc non Af Amer: 40 mL/min — ABNORMAL LOW (ref >60)
Glucose, Bld: 98 mg/dL (ref 70–99)
Potassium: 4.3 mmol/L (ref 3.5–5.1)
Sodium: 141 mmol/L (ref 135–145)
Total Bilirubin: 0.5 mg/dL (ref 0.3–1.2)
Total Protein: 6.2 g/dL — ABNORMAL LOW (ref 6.5–8.1)

## 2020-01-24 NOTE — Assessment & Plan Note (Signed)
I have extensive goals of care with the patient and her caregiver Due to her mental deficit, she is deemed not able to make informed decision on her own Her caregivers will have to make medical decisions for her We discussed the risk and benefits of infusional chemotherapy versus oral chemotherapy and for now, we are leaning to its infusional treatment Due to multiple relapses, the treatment is considered palliative in nature

## 2020-01-24 NOTE — Assessment & Plan Note (Signed)
She had a fall a month ago and was prescribed gabapentin The patient has significant sedated effect from gabapentin Unfortunately, there are no other good ways to treat and manage chronic pain

## 2020-01-24 NOTE — Assessment & Plan Note (Signed)
Unfortunately, she has signs of relapse I recommend CT scan of the chest, abdomen and pelvis to stage her disease I plan to see her again next week to review test results and discuss plan of care I spoke with her sister She tolerated previous treatment well except for severe thrombocytopenia I plan 50% dose adjustment upfront for bendamustine Alternatively, she has not been treated with other oral agents but due to history of noncompliance issue, I think it is probably best to bring her in for infusional treatment rather than oral chemotherapy They are in agreement

## 2020-01-24 NOTE — Progress Notes (Signed)
OFF PATHWAY REGIMEN - Lymphoma and CLL  No Change  Continue With Treatment as Ordered.   [id number]:Bendamustine 90 mg/m2 D1, 2 + Rituximab (IV/Subcut) D1 q28 Days:   A cycle is every 28 days:     Bendamustine      Rituximab      Rituximab and hyaluronidase human   **Always confirm dose/schedule in your pharmacy ordering system**  Patient Characteristics: Small Lymphocytic Lymphoma (SLL), Third Line, 17p del (-) or Unknown Disease Type: Small Lymphocytic Lymphoma (SLL) Disease Type: Not Applicable Disease Type: Not Applicable [place] Stage: IV Line of therapy: Third Line 17p Deletion Status: Negative Intent of Therapy: Non-Curative / Palliative Intent, Discussed with Patient

## 2020-01-24 NOTE — Addendum Note (Signed)
Addended by: Flo Shanks on: 01/24/2020 01:47 PM   Modules accepted: Orders

## 2020-01-24 NOTE — Progress Notes (Signed)
Lab called with critical WBC of 117. Results given to Dr. Alvy Bimler.

## 2020-01-24 NOTE — Telephone Encounter (Signed)
Scheduled per 4/8 sch msg. Called pt left a msg. Mailing printout

## 2020-01-24 NOTE — Progress Notes (Signed)
[facility] OFF[facility] PROGRESS NOTE  Patient Care Team: Jonathon Jordan, MD as PCP - General (Family Medicine)  ASSESSMENT & PLAN:  CLL (chronic lymphocytic leukemia) ([place]) Unfortunately, she has signs of relapse I recommend CT scan of the chest, abdomen and pelvis to stage her disease I plan to see her again next week to review test results and discuss plan of care I spoke with her sister She tolerated previous treatment well except for severe thrombocytopenia I plan 50% dose adjustment upfront for bendamustine Alternatively, she has not been treated with other oral agents but due to history of noncompliance issue, I think it is probably best to bring her in for infusional treatment rather than oral chemotherapy They are in agreement  Fall at home, initial encounter She had a fall a month ago and was prescribed gabapentin The patient has significant sedated effect from gabapentin Unfortunately, there are no other good ways to treat and manage chronic pain  Goals of care, counseling/discussion I have extensive goals of care with the patient and her caregiver Due to her mental deficit, she is deemed not able to make informed decision on her own Her caregivers will have to make medical decisions for her We discussed the risk and benefits of infusional chemotherapy versus oral chemotherapy and for now, we are leaning to its infusional treatment Due to multiple relapses, the treatment is considered palliative in nature   Orders Placed This Encounter  Procedures  . CT ABDOMEN PELVIS W CONTRAST    Standing Status:   Future    Standing Expiration Date:   01/23/2021    Order Specific Question:   If indicated for the ordered procedure, I authorize the administration of contrast media per Radiology protocol    Answer:   Yes    Order Specific Question:   Preferred imaging location?    Answer:   [facility]    Order Specific Question:   Radiology Contrast Protocol - do  NOT remove file path    Answer:   \\[email or web address]  . CT CHEST W CONTRAST    Standing Status:   Future    Standing Expiration Date:   01/23/2021    Order Specific Question:   If indicated for the ordered procedure, I authorize the administration of contrast media per Radiology protocol    Answer:   Yes    Order Specific Question:   Preferred imaging location?    Answer:   [facility]    Order Specific Question:   Radiology Contrast Protocol - do NOT remove file path    Answer:   \\[email or web address]    All questions were answered. The patient knows to call the clinic with any problems, questions or concerns. The total time spent in the appointment was 30 minutes encounter with patients including review of chart and various tests results, discussions about plan of care and coordination of care plan   Heath Lark, MD 01/24/2020 1:05 PM  INTERVAL HISTORY: Please see below for problem oriented charting. She returns with her caregiver for further follow-up She has not been seen over a year due to Covid restrictions imposed on her residential facility She had an accidental fall about a month ago and injured her left ankle and was prescribed gabapentin She complained of some excessive sedation today after even though she was only prescribed 300 mg daily She denies feeling unwell She have not lost any weight since last time I saw her No recent infection, fever or chills  SUMMARY OF ONCOLOGIC HISTORY: Oncology History Overview Note  FISH del 13 q   CLL (chronic lymphocytic leukemia) ([place])  07/02/2010 Procedure   LN biopsy confirmed CLL   08/18/2010 Procedure   Bone marrow biopsy confirmed CLL   11/18/2010 - 04/15/2011 Chemotherapy   patient received 6 cycles of FLudarabine and Rituximab   01/13/2012 Relapse/Recurrence   Repeat BM biopsy confirmed relapse   01/27/2012 - 06/23/2012 Chemotherapy   Bendamustine & Rituximab given,  complicated by infusion reaction to Rituximab, completed 6 cycles   06/29/2013 Imaging   Ct scan confirmed disease relapse with bulky lymphadenopathy   07/23/2013 Procedure   Repeat BM biopsy due to thrombocytopenia and progressive leukocytosis   08/02/2013 Relapse/Recurrence   Patient consented to start iburitinib as salvage therapy for relapsed CLL   11/09/2013 Imaging   Repeat CT scan the chest, abdomen and pelvis showed greater than 50% response to treatment   06/17/2014 Imaging   Repeat CT scan showed near complete response to treatment.   05/30/2015 Imaging   Repeat CT scan showed near complete response to treatment.   06/14/2016 Imaging   CT scan showed stable scattered small retroperitoneal lymph nodes and pelvic lymph nodes. No findings for recurrent or progressive lymphoma. Stable mild splenomegaly.   12/30/2017 Imaging   1. Recurrent adenopathy within the chest, abdomen, and pelvis. 2. Splenomegaly. 3. Aortic Atherosclerosis (ICD10-I70.0). 4. Coronary artery calcifications.   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 The catheter is ready for immediate use.   01/24/2018 -  Chemotherapy   The patient had Gazyva and 1 dose of bendamustine. Bendamustine was discontinued due to inability to get insurance approval   05/08/2018 Imaging   1. Interval partial treatment response. Bilateral axillary, mediastinal, right hilar, retroperitoneal and bilateral pelvic adenopathy is all decreased. Mild splenomegaly, decreased. No new or progressive disease. 2. Aortic Atherosclerosis (ICD10-I70.0).   02/04/2020 -  Chemotherapy   The patient had PALONOSETRON HCL INJECTION 0.25 MG/5ML, 0.25 mg, Intravenous,  Once, 0 of 6 cycles bendamustine (BENDEKA) 200 mg in sodium chloride 0.9 % 50 mL (3.4483 mg/mL) chemo infusion, 90 mg/m2, Intravenous,   Once, 0 of 6 cycles obinutuzumab (GAZYVA) 1,000 mg in sodium chloride 0.9 % 250 mL (3.4483 mg/mL) chemo infusion, 1,000 mg, Intravenous, Once, 0 of 6 cycles  for chemotherapy treatment.      REVIEW OF SYSTEMS:   Constitutional: Denies fevers, chills or abnormal weight loss Eyes: Denies blurriness of vision Ears, nose, mouth, throat, and face: Denies mucositis or sore throat Respiratory: Denies cough, dyspnea or wheezes Cardiovascular: Denies palpitation, chest discomfort or lower extremity swelling Gastrointestinal:  Denies nausea, heartburn or change in bowel habits Skin: Denies abnormal skin rashes Lymphatics: Denies new lymphadenopathy or easy bruising Neurological:Denies numbness, tingling or new weaknesses Behavioral/Psych: Mood is stable, no new changes  All other systems were reviewed with the patient and are negative.  I have reviewed the past medical history, past surgical history, social history and family history with the patient and they are unchanged from previous note.  ALLERGIES:  is allergic to azithromycin and ibuprofen [ibuprofen].  MEDICATIONS:  Current Outpatient Medications  Medication Sig Dispense Refill  . acyclovir (ZOVIRAX) 400 MG tablet Take 1 tablet (400 mg total) by mouth daily. 30 tablet 5  . allopurinol (ZYLOPRIM) 300 MG tablet Take 1 tablet (300 mg total) by mouth daily.  30 tablet 2  . amLODipine (NORVASC) 10 MG tablet TAKE 1 TABLET (10 MG TOTAL) BY MOUTH DAILY. 90 tablet 1  . azelastine (ASTELIN) 0.1 % nasal spray Place 1 spray into both nostrils 2 (two) times daily. Use in each nostril as directed    . bisoprolol (ZEBETA) 5 MG tablet   10  . Carboxymethylcellul-Glycerin (REFRESH OPTIVE SENSITIVE OP) Place 1 drop into both eyes as needed.     . citalopram (CELEXA) 20 MG tablet Take 20 mg by mouth every morning.     . fluticasone (FLONASE) 50 MCG/ACT nasal spray Place 1 spray into both nostrils daily.    . fluticasone-salmeterol (ADVAIR HFA) 115-21  MCG/ACT inhaler Inhale 2 puffs into the lungs 2 (two) times daily.    Marland Kitchen lidocaine-prilocaine (EMLA) cream Apply to affected area once 30 g 5  . loratadine (CLARITIN) 10 MG tablet Take 10 mg daily by mouth.    . losartan-hydrochlorothiazide (HYZAAR) 100-25 MG tablet   9  . Multiple Vitamins-Minerals (PRESERVISION AREDS 2) CAPS Take 1 capsule by mouth 2 (two) times daily.    . ondansetron (ZOFRAN) 8 MG tablet Take 1 tablet (8 mg total) by mouth every 8 (eight) hours as needed. Then take as needed for nausea or vomiting. 30 tablet 1  . prochlorperazine (COMPAZINE) 10 MG tablet Take 1 tablet (10 mg total) by mouth every 6 (six) hours as needed (Nausea or vomiting). 30 tablet 1  . pseudoephedrine-guaifenesin (MUCINEX D) 60-600 MG 12 hr tablet Take 1 tablet by mouth every 12 (twelve) hours.    Marland Kitchen tiZANidine (ZANAFLEX) 2 MG tablet Take by mouth every 6 (six) hours as needed for muscle spasms.    Marland Kitchen triamcinolone (NASACORT) 55 MCG/ACT AERO nasal inhaler Place 2 sprays into the nose daily. 1 Inhaler 12  . Vitamin D, Ergocalciferol, 2000 units CAPS Take by mouth.     No current facility-administered medications for this visit.    PHYSICAL EXAMINATION: ECOG PERFORMANCE STATUS: 1 - Symptomatic but completely ambulatory  Vitals:   01/24/20 1204  BP: (!) 126/52  Pulse: 66  Resp: 18  Temp: 98.7 F (37.1 C)  SpO2: 93%   Filed Weights   01/24/20 1204  Weight: 266 lb 3.2 oz (120.7 kg)    GENERAL:alert, no distress and comfortable NEURO: alert & oriented x 3 with fluent speech, no focal motor/sensory deficits  LABORATORY DATA:  I have reviewed the data as listed    Component Value Date/Time   NA 141 01/24/2020 1119   NA 141 08/23/2017 1026   K 4.3 01/24/2020 1119   K 3.7 08/23/2017 1026   CL 103 01/24/2020 1119   CL 98 02/02/2013 1353   CO2 31 01/24/2020 1119   CO2 33 (H) 08/23/2017 1026   GLUCOSE 98 01/24/2020 1119   GLUCOSE 92 08/23/2017 1026   GLUCOSE 99 02/02/2013 1353   BUN 25 (H)  01/24/2020 1119   BUN 21.0 08/23/2017 1026   CREATININE 1.30 (H) 01/24/2020 1119   CREATININE 1.25 (H) 11/27/2018 1127   CREATININE 1.2 (H) 08/23/2017 1026   CALCIUM 9.4 01/24/2020 1119   CALCIUM 9.3 08/23/2017 1026   PROT 6.2 (L) 01/24/2020 1119   PROT 6.3 (L) 08/23/2017 1026   ALBUMIN 4.0 01/24/2020 1119   ALBUMIN 3.8 08/23/2017 1026   AST 16 01/24/2020 1119   AST 15 11/27/2018 1127   AST 11 08/23/2017 1026   ALT 12 01/24/2020 1119   ALT 20 11/27/2018 1127   ALT 10 08/23/2017 1026  ALKPHOS 104 01/24/2020 1119   ALKPHOS 66 08/23/2017 1026   BILITOT 0.5 01/24/2020 1119   BILITOT 0.4 11/27/2018 1127   BILITOT 0.59 08/23/2017 1026   GFRNONAA 40 (L) 01/24/2020 1119   GFRNONAA 43 (L) 11/27/2018 1127   GFRAA 47 (L) 01/24/2020 1119   GFRAA 50 (L) 11/27/2018 1127    No results found for: SPEP, UPEP  Lab Results  Component Value Date   WBC 117.0 (HH) 01/24/2020   NEUTROABS 7.8 (H) 01/24/2020   HGB 11.8 (L) 01/24/2020   HCT 39.1 01/24/2020   MCV 89.3 01/24/2020   PLT 159 01/24/2020      Chemistry      Component Value Date/Time   NA 141 01/24/2020 1119   NA 141 08/23/2017 1026   K 4.3 01/24/2020 1119   K 3.7 08/23/2017 1026   CL 103 01/24/2020 1119   CL 98 02/02/2013 1353   CO2 31 01/24/2020 1119   CO2 33 (H) 08/23/2017 1026   BUN 25 (H) 01/24/2020 1119   BUN 21.0 08/23/2017 1026   CREATININE 1.30 (H) 01/24/2020 1119   CREATININE 1.25 (H) 11/27/2018 1127   CREATININE 1.2 (H) 08/23/2017 1026      Component Value Date/Time   CALCIUM 9.4 01/24/2020 1119   CALCIUM 9.3 08/23/2017 1026   ALKPHOS 104 01/24/2020 1119   ALKPHOS 66 08/23/2017 1026   AST 16 01/24/2020 1119   AST 15 11/27/2018 1127   AST 11 08/23/2017 1026   ALT 12 01/24/2020 1119   ALT 20 11/27/2018 1127   ALT 10 08/23/2017 1026   BILITOT 0.5 01/24/2020 1119   BILITOT 0.4 11/27/2018 1127   BILITOT 0.59 08/23/2017 1026

## 2020-01-28 ENCOUNTER — Other Ambulatory Visit: Payer: Self-pay | Admitting: Hematology and Oncology

## 2020-01-28 DIAGNOSIS — C9110 Chronic lymphocytic leukemia of B-cell type not having achieved remission: Secondary | ICD-10-CM

## 2020-01-28 DIAGNOSIS — M6281 Muscle weakness (generalized): Secondary | ICD-10-CM | POA: Diagnosis not present

## 2020-01-28 DIAGNOSIS — R2681 Unsteadiness on feet: Secondary | ICD-10-CM | POA: Diagnosis not present

## 2020-01-28 DIAGNOSIS — R262 Difficulty in walking, not elsewhere classified: Secondary | ICD-10-CM | POA: Diagnosis not present

## 2020-01-30 DIAGNOSIS — R262 Difficulty in walking, not elsewhere classified: Secondary | ICD-10-CM | POA: Diagnosis not present

## 2020-01-30 DIAGNOSIS — M6281 Muscle weakness (generalized): Secondary | ICD-10-CM | POA: Diagnosis not present

## 2020-01-30 DIAGNOSIS — R2681 Unsteadiness on feet: Secondary | ICD-10-CM | POA: Diagnosis not present

## 2020-01-31 ENCOUNTER — Ambulatory Visit (HOSPITAL_COMMUNITY)
Admission: RE | Admit: 2020-01-31 | Discharge: 2020-01-31 | Disposition: A | Discharge status: Home / Self Care | Payer: Medicare Other | Source: Ambulatory Visit | Attending: Hematology and Oncology | Admitting: Hematology and Oncology

## 2020-01-31 ENCOUNTER — Other Ambulatory Visit: Payer: Self-pay

## 2020-01-31 ENCOUNTER — Encounter (HOSPITAL_COMMUNITY): Payer: Self-pay

## 2020-01-31 DIAGNOSIS — C9110 Chronic lymphocytic leukemia of B-cell type not having achieved remission: Secondary | ICD-10-CM | POA: Diagnosis not present

## 2020-01-31 MED ORDER — IOHEXOL 300 MG/ML  SOLN
100.0000 mL | Freq: Once | INTRAMUSCULAR | Status: AC | PRN
  Administered 2020-01-31: 75 mL via INTRAVENOUS

## 2020-01-31 MED ORDER — SODIUM CHLORIDE (PF) 0.9 % IJ SOLN
INTRAMUSCULAR | Status: AC
  Filled 2020-01-31: qty 50

## 2020-02-01 ENCOUNTER — Encounter: Payer: Self-pay | Admitting: Hematology and Oncology

## 2020-02-01 ENCOUNTER — Other Ambulatory Visit: Payer: Self-pay | Admitting: Hematology and Oncology

## 2020-02-01 ENCOUNTER — Telehealth: Payer: Self-pay

## 2020-02-01 ENCOUNTER — Other Ambulatory Visit: Payer: Self-pay

## 2020-02-01 ENCOUNTER — Inpatient Hospital Stay: Payer: Medicare Other | Admitting: Hematology and Oncology

## 2020-02-01 VITALS — BP 130/55 | HR 75 | Temp 98.2°F | Resp 20 | Ht 63.0 in | Wt 269.0 lb

## 2020-02-01 DIAGNOSIS — R5382 Chronic fatigue, unspecified: Secondary | ICD-10-CM | POA: Diagnosis not present

## 2020-02-01 DIAGNOSIS — Z7189 Other specified counseling: Secondary | ICD-10-CM

## 2020-02-01 DIAGNOSIS — Z9181 History of falling: Secondary | ICD-10-CM | POA: Diagnosis not present

## 2020-02-01 DIAGNOSIS — Z79899 Other long term (current) drug therapy: Secondary | ICD-10-CM | POA: Diagnosis not present

## 2020-02-01 DIAGNOSIS — C9110 Chronic lymphocytic leukemia of B-cell type not having achieved remission: Secondary | ICD-10-CM | POA: Diagnosis not present

## 2020-02-01 DIAGNOSIS — Z5111 Encounter for antineoplastic chemotherapy: Secondary | ICD-10-CM | POA: Diagnosis not present

## 2020-02-01 NOTE — Telephone Encounter (Signed)
Nurse at [facility] called and left a message to call her.  Called back and left a message asking for a return call.

## 2020-02-01 NOTE — Assessment & Plan Note (Signed)
I reviewed imaging studies with the patient and her sister She has significant disease burden She would like to get treatment started as soon as possible Currently, she is not symptomatic  We reviewed the NCCN guidelines. 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 Augustin Schooling, Marek Trn?n, Kamal Judye Bos, Rosalita Chessman, Rickard Patience, Pieternella J [place], [facility], Gilles A. Elmer Ramp Fingerle-Rowson, Federico [place], Grayland Ormond Wassner-Fritsch and Rosanne Gutting  Blood 2016 [id number];   Background: Treatment options for patients (pts) with relapsed or refractory indolent non-Hodgkin lymphoma (iNHL) are limited. GADOLIN [id number]) is an open-label, randomized, Phase 3 trial comparing the efficacy and safety of obinutuzumab (GA101; GAZYVA/GAZYVARO; G) plus bendamustine (B) induction, followed by G maintenance (G-B arm), with B induction (standard of care) in rituximab-refractory iNHL pts. In the primary analysis, which involved all pts enrolled as of June 18, 2013 (n=396; median observation time, 21.0 months [mo]), median Independent Review Committee (IRC)-assessed progression-free survival (PFS; primary endpoint) was longer in the G-B arm (194 pts; median not reached) than in the B arm (202 pts; 14.9 mo), with a 45% reduction in risk of progression or death (HR 0.55; 95% CI 0.40, 0.74; p=0.0001). Investigator (INV)-assessed PFS was also significantly longer in the G-B arm, but overall survival (OS) data were immature. Safety profiles were comparable. The most common grade ?3 adverse events (AEs) were neutropenia, thrombocytopenia, anemia, and infusion-related reactions (IRRs). Seventeen additional pts were enrolled after the data cut-off for the primary analysis.  Here, we report updated time-to-event and safety results from a planned analysis of all pts (n=413) using a data cut-off of January 17, 2015.  Methods: Enrolled pts were aged ?18 years (yrs) with documented rituximab-refractory iNHL and an ECOG performance status of 0-2. Pts received either G 1000mg  i.v. (days [D] 1, 8, and 15 of cycle [C] 1, and D1 of C2-6) plus B 90mg /m2/day i.v. (D1 and 2 of C1-6), or B monotherapy (120mg /m2/day i.v., D1 and 2 of C1-6); each cycle was 28 days. Following induction, pts in the G-B arm without evidence of progression received G maintenance (1000mg  i.v. every 2 mo for 2 yrs or until disease progression, whichever occurred first). In the current analysis, assessments included INV-assessed PFS, OS, time to new anti-lymphoma treatment (TTNT), and safety. Efficacy assessment was performed on the intent-to-treat (ITT) population. Safety analysis included all pts who received any study treatment, excluding 2 pts who crossed over to G-B during maintenance.  Results: Of 413 iNHL pts in the ITT population (G-B, 204; B, 209), 335 (G-B, 164; B, 171) had FL. Baseline characteristics of the ITT population were balanced between arms. Median number of prior regimens was 2 in both arms (pts with ?2 prior regimens: G-B, 80.4%; B, 77.5%). Most pts were refractory to their last regimen (G-B, 92.2%; B, 92.3%). After 31.8 mo median follow-up, median INV-assessed PFS was 25.8 mo in the G-B arm and 14.1 mo in the B arm; HR was 0.57 (95% CI 0.44, 0.73; p<0.0001), i.e. a 43% reduction in risk of progression or death for G-B relative to B. Fewer pts died in the G-B arm (25.5%) than the B arm (34.9%), with a HR for OS of 0.67 (95% CI 0.47, 0.96; p=0.0269; risk reduction, 33%; Figure 1A); median OS was not reached for either arm. Results for FL pts were consistent with those for ITT pts (median PFS:  25.3 vs. 14.0 mo [HR 0.52; 95% CI 0.39, 0.69; p<0.0001]; median OS: not reached vs. 53.9 mo [HR 0.58; 95% CI  0.39, 0.86; p=0.0061; Figure 1B]). Median TTNT was also longer in the G-B arm than in the B arm (ITT pts: 40.8 vs. 19.4 mo, respectively [HR 0.59; 95% CI 0.45, 0.77]; FL pts: 33.6 vs. 18.0 mo, respectively [HR 0.57; 95% CI 0.43, 0.75]). The overall safety profile of G-B remained consistent with results of the primary analysis. In the ITT population, there were more grade ?3 AEs with G-B than with B (72.5% vs. 65.5%, respectively), notably neutropenia (34.8% vs. 27.1%) and IRRs (9.3% vs. 3.5%); grade ?3 thrombocytopenia (10.8% vs. 15.8%) and anemia (7.4% vs. 10.8%) were less frequent in the G-B arm, while grade ?3 infections (22.5 % vs. 19.2%) and secondary malignancies (5.9% vs. 5.4%) were reported with a similar incidence. Serious AEs were more frequent in the G-B arm (43.6% vs. 36.9%), but the incidence of grade 5 (fatal) AEs was similar (7.8% vs. 6.4%). Safety results in FL pts were comparable with those in all iNHL pts.  Conclusions: Updated analysis of the [place] study with ~10 mo additional follow-up confirms the previously reported PFS benefit of G-B over B in pts with rituximab-refractory iNHL, and demonstrates a significant improvement in OS in the G-B arm. No new safety signals were detected  We discussed some of the risks, benefits and side-effects of Gazyva with Bendamustine.   Some of the short term side-effects included, though not limited to, risk of fatigue, weight loss, tumor lysis syndrome, risk of allergic reactions, pancytopenia, life-threatening infections, need for transfusions of blood products, nausea, vomiting, change in bowel habits, admission to hospital for various reasons, and risks of death.   Long term side-effects are also discussed including permanent damage to nerve function, chronic fatigue, and rare secondary malignancy including bone marrow disorders.   The patient is aware that the response rates discussed earlier is not guaranteed.    After a long discussion,  patient made an informed decision to proceed with the prescribed plan of care.   Patient education material was dispensed Due to extensive disease burden, I am concerned about high risk of tumor lysis syndrome and allergic reaction to obinutuzumab I will omit that for the first 2 cycles of therapy I recommend antimicrobial prophylaxis with acyclovir as well as Bactrim I will prescribe allopurinol for tumor lysis prophylaxis I will try to get her scheduled to start treatment next week and I will see her back prior to cycle 2 of therapy

## 2020-02-01 NOTE — Assessment & Plan Note (Signed)
I have extensive goals of care with the patient and her caregiver Due to her mental deficit, she is deemed not able to make informed decision on her own Her caregivers will have to make medical decisions for her We discussed the risk and benefits of infusional chemotherapy is discussed and they are willing to proceed They are aware that treatment goal is palliative

## 2020-02-01 NOTE — Progress Notes (Signed)
[facility] OFFICE PROGRESS NOTE  Patient Care Team: Jonathon Jordan, MD as PCP - General (Family Medicine)  ASSESSMENT & PLAN:  CLL (chronic lymphocytic leukemia) ([place]) I reviewed imaging studies with the patient and [name] has significant disease burden Erin Moore would like to get treatment started as soon as possible Currently, Erin Moore is not symptomatic  We reviewed the NCCN guidelines. Treatment goal is palliative  Obinutuzumab plus Bendamustine Followed by Obinutuzumab Maintenance Prolongs Overall Survival Compared with Bendamustine Alone in Patients with Rituximab-Refractory Indolent Non-Hodgkin Lymphoma: Updated Results of the [place] D. Augustin Schooling, Marek Trn?n, Kamal Judye Bos, Rosalita Chessman, Rickard Patience, Pieternella J [place], [facility], Gilles A. Elmer Ramp [place]inger[place]-Rowson, Federico [place], Grayland Ormond Wassner-Fritsch and Rosanne Gutting  Blood 2016 [id number];   Background: Treatment options for patients (pts) with relapsed or refractory indolent non-Hodgkin lymphoma (iNHL) are limited. GADOLIN ([id number]) is an open-label, randomized, Phase 3 trial comparing the efficacy and safety of obinutuzumab (GA101; GAZYVA/GAZYVARO; G) plus bendamustine (B) induction, followed by G maintenance (G-B arm), with B induction (standard of care) in rituximab-refractory iNHL pts. In the primary analysis, which involved all pts enrolled as of June 18, 2013 (n=396; median observation time, 21.0 months [mo]), median Independent Review Committee (IRC)-assessed progression-free survival (PFS; primary endpoint) was longer in the G-B arm (194 pts; median not reached) than in the B arm (202 pts; 14.9 mo), with a 45% reduction in risk of progression or death (HR 0.55; 95% CI 0.40, 0.74; p=0.0001). Investigator (INV)-assessed PFS was also significantly longer in the G-B arm, but overall survival (OS) data were immature. Safety profiles were comparable. The most common grade ?3  adverse events (AEs) were neutropenia, thrombocytopenia, anemia, and infusion-related reactions (IRRs). Seventeen additional pts were enrolled after the data cut-off for the primary analysis. Here, we report updated time-to-event and safety results from a planned analysis of all pts (n=413) using a data cut-off of January 17, 2015.  Methods: Enrolled pts were aged ?18 years (yrs) with documented rituximab-refractory iNHL and an ECOG performance status of 0-2. Pts received either G 1066m i.v. (days [D] 1, 8, and 15 of cycle [C] 1, and D1 of C2-6) plus B 963mm2/day i.v. (D1 and 2 of C1-6), or B monotherapy (12055m2/day i.v., D1 and 2 of C1-6); each cycle was 28 days. Following induction, pts in the G-B arm without evidence of progression received G maintenance (1000m73mv. every 2 mo for 2 yrs or until disease progression, whichever occurred first). In the current analysis, assessments included INV-assessed PFS, OS, time to new anti-lymphoma treatment (TTNT), and safety. Efficacy assessment was performed on the intent-to-treat (ITT) population. Safety analysis included all pts who received any study treatment, excluding 2 pts who crossed over to G-B during maintenance.  Results: Of 413 iNHL pts in the ITT population (G-B, 204; B, 209), 335 (G-B, 164; B, 171) had FL. Baseline characteristics of the ITT population were balanced between arms. Median number of prior regimens was 2 in both arms (pts with ?2 prior regimens: G-B, 80.4%; B, 77.5%). Most pts were refractory to their last regimen (G-B, 92.2%; B, 92.3%). After 31.8 mo median follow-up, median INV-assessed PFS was 25.8 mo in the G-B arm and 14.1 mo in the B arm; HR was 0.57 (95% CI 0.44, 0.73; p<0.0001), i.e. a 43% reduction in risk of progression or death for G-B relative to B. Fewer pts died in the G-B arm (25.5%) than the B arm (34.9%), with a HR for OS of 0.67 (95%  CI 0.47, 0.96; p=0.0269; risk reduction, 33%; Figure 1A); median OS was not reached for  either arm. Results for FL pts were consistent with those for ITT pts (median PFS: 25.3 vs. 14.0 mo [HR 0.52; 95% CI 0.39, 0.69; p<0.0001]; median OS: not reached vs. 53.9 mo [HR 0.58; 95% CI 0.39, 0.86; p=0.0061; Figure 1B]). Median TTNT was also longer in the G-B arm than in the B arm (ITT pts: 40.8 vs. 19.4 mo, respectively [HR 0.59; 95% CI 0.45, 0.77]; FL pts: 33.6 vs. 18.0 mo, respectively [HR 0.57; 95% CI 0.43, 0.75]). The overall safety profile of G-B remained consistent with results of the primary analysis. In the ITT population, there were more grade ?3 AEs with G-B than with B (72.5% vs. 65.5%, respectively), notably neutropenia (34.8% vs. 27.1%) and IRRs (9.3% vs. 3.5%); grade ?3 thrombocytopenia (10.8% vs. 15.8%) and anemia (7.4% vs. 10.8%) were less frequent in the G-B arm, while grade ?3 infections (22.5 % vs. 19.2%) and secondary malignancies (5.9% vs. 5.4%) were reported with a similar incidence. Serious AEs were more frequent in the G-B arm (43.6% vs. 36.9%), but the incidence of grade 5 (fatal) AEs was similar (7.8% vs. 6.4%). Safety results in FL pts were comparable with those in all iNHL pts.  Conclusions: Updated analysis of the [place] study with ~10 mo additional follow-up confirms the previously reported PFS benefit of G-B over B in pts with rituximab-refractory iNHL, and demonstrates a significant improvement in OS in the G-B arm. No new safety signals were detected  We discussed some of the risks, benefits and side-effects of Gazyva with Bendamustine.   Some of the short term side-effects included, though not limited to, risk of fatigue, weight loss, tumor lysis syndrome, risk of allergic reactions, pancytopenia, life-threatening infections, need for transfusions of blood products, nausea, vomiting, change in bowel habits, admission to hospital for various reasons, and risks of death.   Long term side-effects are also discussed including permanent damage to nerve function, chronic  fatigue, and rare secondary malignancy including bone marrow disorders.   The patient is aware that the response rates discussed earlier is not guaranteed.    After a long discussion, patient made an informed decision to proceed with the prescribed plan of care.   Patient education material was dispensed Due to extensive disease burden, I am concerned about high risk of tumor lysis syndrome and allergic reaction to obinutuzumab I will omit that for the first 2 cycles of therapy I recommend antimicrobial prophylaxis with acyclovir as well as Bactrim I will prescribe allopurinol for tumor lysis prophylaxis I will try to get her scheduled to start treatment next week and I will see her back prior to cycle 2 of therapy  Goals of care, counseling/discussion I have extensive goals of care with the patient and her caregiver Due to her mental deficit, Erin Moore is deemed not able to make informed decision on her own Her caregivers will have to make medical decisions for her We discussed the risk and benefits of infusional chemotherapy is discussed and they are willing to proceed They are aware that treatment goal is palliative   Orders Placed This Encounter  Procedures  . CBC with Differential (Cancer Center Only)    Standing Status:   Standing    Number of Occurrences:   20    Standing Expiration Date:   01/31/2021  . CMP ([place] only)    Standing Status:   Standing    Number of Occurrences:   20  Standing Expiration Date:   01/31/2021    All questions were answered. The patient knows to call the clinic with any problems, questions or concerns. The total time spent in the appointment was 40 minutes encounter with patients including review of chart and various tests results, discussions about plan of care and coordination of care plan   Heath Lark, MD 02/01/2020 11:24 AM  INTERVAL HISTORY: Please see below for problem oriented charting. 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   CLL (chronic lymphocytic leukemia) ([place])  07/02/2010 Procedure   LN biopsy confirmed CLL   08/18/2010 Procedure   Bone marrow biopsy confirmed CLL   11/18/2010 - 04/15/2011 Chemotherapy   patient received 6 cycles of FLudarabine and Rituximab   01/13/2012 Relapse/Recurrence   Repeat BM biopsy confirmed relapse   01/27/2012 - 06/23/2012 Chemotherapy   Bendamustine & Rituximab given, complicated by infusion reaction to Rituximab, completed 6 cycles   06/29/2013 Imaging   Ct scan confirmed disease relapse with bulky lymphadenopathy   07/23/2013 Procedure   Repeat BM biopsy due to thrombocytopenia and progressive leukocytosis   08/02/2013 Relapse/Recurrence   Patient consented to start iburitinib as salvage therapy for relapsed CLL   11/09/2013 Imaging   Repeat CT scan the chest, abdomen and pelvis showed greater than 50% response to treatment   06/17/2014 Imaging   Repeat CT scan showed near complete response to treatment.   05/30/2015 Imaging   Repeat CT scan showed near complete response to treatment.   06/14/2016 Imaging   CT scan showed stable scattered small retroperitoneal lymph nodes and pelvic lymph nodes. No findings for recurrent or progressive lymphoma. Stable mild splenomegaly.   12/30/2017 Imaging   1. Recurrent adenopathy within the chest, abdomen, and pelvis. 2. Splenomegaly. 3. Aortic Atherosclerosis (ICD10-I70.0). 4. Coronary artery calcifications.   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 The catheter is ready for immediate use.   01/24/2018 -  Chemotherapy   The patient had Gazyva and 1 dose of bendamustine. Bendamustine was discontinued due to inability to get  insurance approval   05/08/2018 Imaging   1. Interval partial treatment response. Bilateral axillary, mediastinal, right hilar, retroperitoneal and bilateral pelvic adenopathy is all decreased. Mild splenomegaly, decreased. No new or progressive disease. 2. Aortic Atherosclerosis (ICD10-I70.0).   01/31/2020 Imaging   1. Since 12/29/2017, marked progression of adenopathy within the chest, abdomen, and pelvis as detailed above. 2. Progressive splenomegaly, consistent with splenic involvement. 3. New pulmonary parenchymal findings which could represent interval infection (including atypical etiologies) or aspiration. 4. Coronary artery atherosclerosis. Aortic Atherosclerosis (ICD10-I70.0).   02/04/2020 -  Chemotherapy   The patient had palonosetron (ALOXI) injection 0.25 mg, 0.25 mg, Intravenous,  Once, 0 of 6 cycles bendamustine (BENDEKA) 125 mg in sodium chloride 0.9 % 50 mL (2.2727 mg/mL) chemo infusion, 70 mg/m2 = 125 mg (100 % of original dose 70 mg/m2), Intravenous,  Once, 0 of 6 cycles Dose modification: 70 mg/m2 (original dose 70 mg/m2, Cycle 1, Reason: Provider Judgment) obinutuzumab (GAZYVA) 1,000 mg in sodium chloride 0.9 % 250 mL (3.4483 mg/mL) chemo infusion, 1,000 mg, Intravenous, Once, 0 of 4 cycles  for chemotherapy treatment.      REVIEW OF SYSTEMS:  Constitutional: Denies fevers, chills or abnormal weight loss Eyes: Denies blurriness of vision Ears, nose, mouth, throat, and face: Denies mucositis or sore throat Respiratory: Denies cough, dyspnea or wheezes Cardiovascular: Denies palpitation, chest discomfort or lower extremity swelling Gastrointestinal:  Denies nausea, heartburn or change in bowel habits Skin: Denies abnormal skin rashes Neurological:Denies numbness, tingling or new weaknesses Behavioral/Psych: Mood is stable, no new changes  All other systems were reviewed with the patient and are negative.  I have reviewed the past medical history, past surgical  history, social history and family history with the patient and they are unchanged from previous note.  ALLERGIES:  is allergic to azithromycin and ibuprofen.  MEDICATIONS:  Current Outpatient Medications  Medication Sig Dispense Refill  . acetaminophen (TYLENOL) 325 MG tablet Take 650 mg by mouth every 6 (six) hours as needed.    Marland Kitchen acyclovir (ZOVIRAX) 400 MG tablet Take 1 tablet (400 mg total) by mouth daily. 30 tablet 5  . albuterol (VENTOLIN HFA) 108 (90 Base) MCG/ACT inhaler Inhale 2 puffs into the lungs in the morning and at bedtime.    Marland Kitchen allopurinol (ZYLOPRIM) 300 MG tablet Take 1 tablet (300 mg total) by mouth daily. 30 tablet 2  . amLODipine (NORVASC) 10 MG tablet TAKE 1 TABLET (10 MG TOTAL) BY MOUTH DAILY. 90 tablet 1  . azelastine (ASTELIN) 0.1 % nasal spray Place 1 spray into both nostrils 2 (two) times daily. Use in each nostril as directed    . bisoprolol (ZEBETA) 5 MG tablet Take 10 mg by mouth daily.   10  . Carboxymethylcellul-Glycerin (REFRESH OPTIVE SENSITIVE OP) Place 1 drop into both eyes as needed.     . citalopram (CELEXA) 20 MG tablet Take 20 mg by mouth every morning.     . fluticasone-salmeterol (ADVAIR HFA) 115-21 MCG/ACT inhaler Inhale 2 puffs into the lungs 2 (two) times daily.    Marland Kitchen gabapentin (NEURONTIN) 300 MG capsule Take 300 mg by mouth at bedtime.    . hydrochlorothiazide (HYDRODIURIL) 25 MG tablet Take 25 mg by mouth daily.    Marland Kitchen levocetirizine (XYZAL) 5 MG tablet Take 5 mg by mouth every evening.    . lidocaine-prilocaine (EMLA) cream Apply to affected area once 30 g 5  . losartan-hydrochlorothiazide (HYZAAR) 100-25 MG tablet Take by mouth daily.   9  . Multiple Vitamins-Minerals (PRESERVISION AREDS 2) CAPS Take 1 capsule by mouth 2 (two) times daily.    Marland Kitchen triamcinolone (NASACORT) 55 MCG/ACT AERO nasal inhaler Place 2 sprays into the nose daily. 1 Inhaler 12  . Vitamin D, Ergocalciferol, 2000 units CAPS Take by mouth.     No current facility-administered  medications for this visit.    PHYSICAL EXAMINATION: ECOG PERFORMANCE STATUS: 1 - Symptomatic but completely ambulatory  Vitals:   02/01/20 1036  BP: (!) 130/55  Pulse: 75  Resp: 20  Temp: 98.2 F (36.8 C)  SpO2: 95%   Filed Weights   02/01/20 1036  Weight: 269 lb (122 kg)    GENERAL:alert, no distress and comfortable.  Limited examination due to class III obesity SKIN: skin color, texture, turgor are normal, no rashes or significant lesions EYES: normal, Conjunctiva are pink and non-injected, sclera clear OROPHARYNX:no exudate, no erythema and lips, buccal mucosa, and tongue normal  NECK: supple, thyroid normal size, non-tender, without nodularity LYMPH: Erin Moore has palpable lymphadenopathy in the head and neck as well as axillary region LUNGS: clear to auscultation and percussion with normal breathing effort HEART: regular rate & rhythm and  no murmurs and no lower extremity edema ABDOMEN:abdomen soft, non-tender and normal bowel sounds Musculoskeletal:no cyanosis of digits and no clubbing  NEURO: alert & oriented x 3 with fluent speech, no focal motor/sensory deficits  LABORATORY DATA:  I have reviewed the data as listed    Component Value Date/Time   NA 141 01/24/2020 1119   NA 141 08/23/2017 1026   K 4.3 01/24/2020 1119   K 3.7 08/23/2017 1026   CL 103 01/24/2020 1119   CL 98 02/02/2013 1353   CO2 31 01/24/2020 1119   CO2 33 (H) 08/23/2017 1026   GLUCOSE 98 01/24/2020 1119   GLUCOSE 92 08/23/2017 1026   GLUCOSE 99 02/02/2013 1353   BUN 25 (H) 01/24/2020 1119   BUN 21.0 08/23/2017 1026   CREATININE 1.30 (H) 01/24/2020 1119   CREATININE 1.25 (H) 11/27/2018 1127   CREATININE 1.2 (H) 08/23/2017 1026   CALCIUM 9.4 01/24/2020 1119   CALCIUM 9.3 08/23/2017 1026   PROT 6.2 (L) 01/24/2020 1119   PROT 6.3 (L) 08/23/2017 1026   ALBUMIN 4.0 01/24/2020 1119   ALBUMIN 3.8 08/23/2017 1026   AST 16 01/24/2020 1119   AST 15 11/27/2018 1127   AST 11 08/23/2017 1026   ALT 12  01/24/2020 1119   ALT 20 11/27/2018 1127   ALT 10 08/23/2017 1026   ALKPHOS 104 01/24/2020 1119   ALKPHOS 66 08/23/2017 1026   BILITOT 0.5 01/24/2020 1119   BILITOT 0.4 11/27/2018 1127   BILITOT 0.59 08/23/2017 1026   GFRNONAA 40 (L) 01/24/2020 1119   GFRNONAA 43 (L) 11/27/2018 1127   GFRAA 47 (L) 01/24/2020 1119   GFRAA 50 (L) 11/27/2018 1127    No results found for: SPEP, UPEP  Lab Results  Component Value Date   WBC 117.0 (HH) 01/24/2020   NEUTROABS 7.8 (H) 01/24/2020   HGB 11.8 (L) 01/24/2020   HCT 39.1 01/24/2020   MCV 89.3 01/24/2020   PLT 159 01/24/2020      Chemistry      Component Value Date/Time   NA 141 01/24/2020 1119   NA 141 08/23/2017 1026   K 4.3 01/24/2020 1119   K 3.7 08/23/2017 1026   CL 103 01/24/2020 1119   CL 98 02/02/2013 1353   CO2 31 01/24/2020 1119   CO2 33 (H) 08/23/2017 1026   BUN 25 (H) 01/24/2020 1119   BUN 21.0 08/23/2017 1026   CREATININE 1.30 (H) 01/24/2020 1119   CREATININE 1.25 (H) 11/27/2018 1127   CREATININE 1.2 (H) 08/23/2017 1026      Component Value Date/Time   CALCIUM 9.4 01/24/2020 1119   CALCIUM 9.3 08/23/2017 1026   ALKPHOS 104 01/24/2020 1119   ALKPHOS 66 08/23/2017 1026   AST 16 01/24/2020 1119   AST 15 11/27/2018 1127   AST 11 08/23/2017 1026   ALT 12 01/24/2020 1119   ALT 20 11/27/2018 1127   ALT 10 08/23/2017 1026   BILITOT 0.5 01/24/2020 1119   BILITOT 0.4 11/27/2018 1127   BILITOT 0.59 08/23/2017 1026       RADIOGRAPHIC STUDIES: I have reviewed multiple imaging studies with the patient and her sister I have personally reviewed the radiological images as listed and agreed with the findings in the report. CT CHEST W CONTRAST  Result Date: 01/31/2020 CLINICAL DATA:  History of chronic lymphocytic leukemia diagnosed in 2011. Chemotherapy complete. Cough for 1 year. Shortness of breath. EXAM: CT CHEST, ABDOMEN, AND PELVIS WITH CONTRAST TECHNIQUE: Multidetector CT imaging of the chest, abdomen and  pelvis was  performed following the standard protocol during bolus administration of intravenous contrast. CONTRAST:  38m OMNIPAQUE IOHEXOL 300 MG/ML  SOLN COMPARISON:  05/08/2018 FINDINGS: CT CHEST FINDINGS Cardiovascular: Right Port-A-Cath tip at high right atrium. Bovine arch. Aortic atherosclerosis. Tortuous thoracic aorta. Normal heart size, without pericardial effusion. Multivessel coronary artery atherosclerosis. No central pulmonary embolism, on this non-dedicated study. Mediastinum/Nodes: Low left jugular/supraclavicular adenopathy is new at 1.5 cm on 05/02. Marked axillary adenopathy. An index left axillary node measures 1.7 cm on 20/2 versus 6 mm on the prior. Progressive mediastinal adenopathy. A node within the azygoesophageal recess measures 2.1 cm on 24/2 versus 1.2 cm on the prior exam (when remeasured). New right infrahilar adenopathy with an index node of 1.5 cm on 28/2. Lungs/Pleura: No pleural fluid. Perifissural nodules, including on the right minor fissure at 6 mm on 71/6. These are primarily felt to be similar. Significantly increased bilateral lower lobe bronchial wall thickening and mucoid impaction. New areas of posterior right upper lobe airspace and ground-glass opacity. Primarily dependent bilateral lower lobe airspace disease. Clustered "tree in bud" nodularity in the posterior left upper lobe. These findings are all new. A right lower lobe nodular density of 7 mm on 77/6 is favored to be indicative of nodular airspace disease. Musculoskeletal: No acute osseous abnormality. Lower thoracic vertebral hemangioma CT ABDOMEN PELVIS FINDINGS Hepatobiliary: Borderline hepatomegaly at 17.4 cm craniocaudal. No focal liver lesion. Cholecystectomy, without biliary ductal dilatation. Pancreas: Normal, without mass or ductal dilatation. Spleen: Progressive splenomegaly, including at 21.1 cm craniocaudal. 14.7 cm on the prior (when remeasured). Hypoattenuating splenic lesions are nonspecific at maximally 1.1  cm. Likely subtly apparent on the prior. Adrenals/Urinary Tract: Normal adrenal glands. Mild renal cortical thinning bilaterally. Normal urinary bladder. Stomach/Bowel: Normal stomach, without wall thickening. Normal terminal ileum and appendix. Normal small bowel. Vascular/Lymphatic: Aortic and branch vessel atherosclerosis. Progression of marked abdominal adenopathy. Index left periaortic nodal mass measures 3.4 x 3.8 cm on 76/2. At the site of a 1.5 cm node on the prior exam (when remeasured). Portal caval index node measures 2.4 cm on 58/2 versus 1.1 cm on the prior. Massive pelvic sidewall adenopathy. Index left external iliac node measures 5.2 x 7.5 cm today versus 2.2 x 4.0 cm on the prior exam (when remeasured). Bilateral inguinal adenopathy. Reproductive: Hysterectomy.  No adnexal mass. Other: No significant free fluid. Fat containing ventral abdominal wall laxity. Musculoskeletal: No acute osseous abnormality. IMPRESSION: 1. Since 12/29/2017, marked progression of adenopathy within the chest, abdomen, and pelvis as detailed above. 2. Progressive splenomegaly, consistent with splenic involvement. 3. New pulmonary parenchymal findings which could represent interval infection (including atypical etiologies) or aspiration. 4. Coronary artery atherosclerosis. Aortic Atherosclerosis (ICD10-I70.0). Electronically Signed   By: KAbigail MiyamotoM.D.   On: 01/31/2020 11:20   CT ABDOMEN PELVIS W CONTRAST  Result Date: 01/31/2020 CLINICAL DATA:  History of chronic lymphocytic leukemia diagnosed in 2011. Chemotherapy complete. Cough for 1 year. Shortness of breath. EXAM: CT CHEST, ABDOMEN, AND PELVIS WITH CONTRAST TECHNIQUE: Multidetector CT imaging of the chest, abdomen and pelvis was performed following the standard protocol during bolus administration of intravenous contrast. CONTRAST:  759mOMNIPAQUE IOHEXOL 300 MG/ML  SOLN COMPARISON:  05/08/2018 FINDINGS: CT CHEST FINDINGS Cardiovascular: Right Port-A-Cath tip at  high right atrium. Bovine arch. Aortic atherosclerosis. Tortuous thoracic aorta. Normal heart size, without pericardial effusion. Multivessel coronary artery atherosclerosis. No central pulmonary embolism, on this non-dedicated study. Mediastinum/Nodes: Low left jugular/supraclavicular adenopathy is new at 1.5 cm on 05/02. Marked  axillary adenopathy. An index left axillary node measures 1.7 cm on 20/2 versus 6 mm on the prior. Progressive mediastinal adenopathy. A node within the azygoesophageal recess measures 2.1 cm on 24/2 versus 1.2 cm on the prior exam (when remeasured). New right infrahilar adenopathy with an index node of 1.5 cm on 28/2. Lungs/Pleura: No pleural fluid. Perifissural nodules, including on the right minor fissure at 6 mm on 71/6. These are primarily felt to be similar. Significantly increased bilateral lower lobe bronchial wall thickening and mucoid impaction. New areas of posterior right upper lobe airspace and ground-glass opacity. Primarily dependent bilateral lower lobe airspace disease. Clustered "tree in bud" nodularity in the posterior left upper lobe. These findings are all new. A right lower lobe nodular density of 7 mm on 77/6 is favored to be indicative of nodular airspace disease. Musculoskeletal: No acute osseous abnormality. Lower thoracic vertebral hemangioma CT ABDOMEN PELVIS FINDINGS Hepatobiliary: Borderline hepatomegaly at 17.4 cm craniocaudal. No focal liver lesion. Cholecystectomy, without biliary ductal dilatation. Pancreas: Normal, without mass or ductal dilatation. Spleen: Progressive splenomegaly, including at 21.1 cm craniocaudal. 14.7 cm on the prior (when remeasured). Hypoattenuating splenic lesions are nonspecific at maximally 1.1 cm. Likely subtly apparent on the prior. Adrenals/Urinary Tract: Normal adrenal glands. Mild renal cortical thinning bilaterally. Normal urinary bladder. Stomach/Bowel: Normal stomach, without wall thickening. Normal terminal ileum and  appendix. Normal small bowel. Vascular/Lymphatic: Aortic and branch vessel atherosclerosis. Progression of marked abdominal adenopathy. Index left periaortic nodal mass measures 3.4 x 3.8 cm on 76/2. At the site of a 1.5 cm node on the prior exam (when remeasured). Portal caval index node measures 2.4 cm on 58/2 versus 1.1 cm on the prior. Massive pelvic sidewall adenopathy. Index left external iliac node measures 5.2 x 7.5 cm today versus 2.2 x 4.0 cm on the prior exam (when remeasured). Bilateral inguinal adenopathy. Reproductive: Hysterectomy.  No adnexal mass. Other: No significant free fluid. Fat containing ventral abdominal wall laxity. Musculoskeletal: No acute osseous abnormality. IMPRESSION: 1. Since 12/29/2017, marked progression of adenopathy within the chest, abdomen, and pelvis as detailed above. 2. Progressive splenomegaly, consistent with splenic involvement. 3. New pulmonary parenchymal findings which could represent interval infection (including atypical etiologies) or aspiration. 4. Coronary artery atherosclerosis. Aortic Atherosclerosis (ICD10-I70.0). Electronically Signed   By: Abigail Miyamoto M.D.   On: 01/31/2020 11:20

## 2020-02-04 ENCOUNTER — Telehealth: Payer: Self-pay | Admitting: Hematology and Oncology

## 2020-02-04 DIAGNOSIS — M6281 Muscle weakness (generalized): Secondary | ICD-10-CM | POA: Diagnosis not present

## 2020-02-04 DIAGNOSIS — R2681 Unsteadiness on feet: Secondary | ICD-10-CM | POA: Diagnosis not present

## 2020-02-04 DIAGNOSIS — R262 Difficulty in walking, not elsewhere classified: Secondary | ICD-10-CM | POA: Diagnosis not present

## 2020-02-04 NOTE — Telephone Encounter (Signed)
Scheduled appts per 4/16 sch msg. Pt's sister confirmed appt dates and times.

## 2020-02-04 NOTE — Progress Notes (Signed)
Pharmacist Chemotherapy Monitoring - Initial Assessment    Anticipated start date: 02/07/20 Pt has received Treanda in past 2013 & 2019.  Insurance coverage issue in 2019 & MD used [place] alone. Pt has received Gazyva in past 2019 & last dose given 11/28/18  Due to extensive disease burden, MD concerned about high risk of tumor lysis syndrome and allergic reaction to obinutuzumab I will omit that for the first 2 cycles of therapy-- Gazyva ordered to start C3 ~ 03/31/20  Regimen:  . Are orders appropriate based on the patient's diagnosis, regimen, and cycle? Yes . Does the plan date match the patient's scheduled date? Yes . Is the sequencing of drugs appropriate? Yes . Are the premedications appropriate for the patient's regimen? Yes . Prior Authorization for treatment is: Pending o If applicable, is the correct biosimilar selected based on the patient's insurance? not applicable  Organ Function and Labs: Marland Kitchen Are dose adjustments needed based on the patient's renal function, hepatic function, or hematologic function? No . Are appropriate labs ordered prior to the start of patient's treatment? Yes . Other organ system assessment, if indicated: N/A . The following baseline labs, if indicated, have been ordered: obinutuzumab: baseline Hepatitis B labs  Dose Assessment: . Are the drug doses appropriate? Yes . Are the following correct: o Drug concentrations Yes o IV fluid compatible with drug Yes o Administration routes Yes o Timing of therapy Yes . If applicable, does the patient have documented access for treatment and/or plans for port-a-cath placement? Port placed 01/17/18 . If applicable, have lifetime cumulative doses been properly documented and assessed? not applicable  Toxicity Monitoring/Prevention: . The patient has the following take home antiemetics prescribed: Need Rx's . The patient has the following take home medications prescribed: Need Rx's for Allopurinol, Acyclovir &  Bactrim . Medication allergies and previous infusion related reactions, if applicable, have been reviewed and addressed. Yes . The patient's current medication list has been assessed for drug-drug interactions with their chemotherapy regimen. no significant drug-drug interactions were identified on review.  Order Review: . Are the treatment plan orders signed? Yes . Is the patient scheduled to see a provider prior to their treatment? No  I verify that I have reviewed each item in the above checklist and answered each question accordingly.  Kennith [facility] P 02/04/2020 4:41 PM

## 2020-02-05 ENCOUNTER — Other Ambulatory Visit: Payer: Self-pay | Admitting: Hematology and Oncology

## 2020-02-05 DIAGNOSIS — C9110 Chronic lymphocytic leukemia of B-cell type not having achieved remission: Secondary | ICD-10-CM

## 2020-02-05 MED ORDER — PROCHLORPERAZINE MALEATE 10 MG PO TABS: 10.0000 mg | ORAL_TABLET | Freq: Four times a day (QID) | ORAL | 1 refills | Status: DC | PRN

## 2020-02-05 MED ORDER — ONDANSETRON HCL 8 MG PO TABS: 8.0000 mg | ORAL_TABLET | Freq: Two times a day (BID) | ORAL | 1 refills | Status: DC

## 2020-02-05 MED ORDER — LIDOCAINE-PRILOCAINE 2.5-2.5 % EX CREA: TOPICAL_CREAM | CUTANEOUS | 3 refills | Status: DC

## 2020-02-05 MED ORDER — ACYCLOVIR 400 MG PO TABS: 400.0000 mg | ORAL_TABLET | Freq: Every day | ORAL | 5 refills | Status: AC

## 2020-02-05 MED ORDER — SULFAMETHOXAZOLE-TRIMETHOPRIM 800-160 MG PO TABS: 1.0000 | ORAL_TABLET | ORAL | 5 refills | Status: AC

## 2020-02-05 MED ORDER — ALLOPURINOL 300 MG PO TABS: 300.0000 mg | ORAL_TABLET | Freq: Every day | ORAL | 0 refills | Status: DC

## 2020-02-06 ENCOUNTER — Ambulatory Visit: Payer: Medicare Other

## 2020-02-06 ENCOUNTER — Other Ambulatory Visit: Payer: Self-pay

## 2020-02-06 ENCOUNTER — Telehealth: Payer: Self-pay

## 2020-02-06 ENCOUNTER — Other Ambulatory Visit: Payer: Medicare Other

## 2020-02-06 DIAGNOSIS — R2681 Unsteadiness on feet: Secondary | ICD-10-CM | POA: Diagnosis not present

## 2020-02-06 DIAGNOSIS — R262 Difficulty in walking, not elsewhere classified: Secondary | ICD-10-CM | POA: Diagnosis not present

## 2020-02-06 DIAGNOSIS — C9110 Chronic lymphocytic leukemia of B-cell type not having achieved remission: Secondary | ICD-10-CM

## 2020-02-06 DIAGNOSIS — M6281 Muscle weakness (generalized): Secondary | ICD-10-CM | POA: Diagnosis not present

## 2020-02-06 MED ORDER — ALLOPURINOL 300 MG PO TABS: 300.0000 mg | ORAL_TABLET | Freq: Every day | ORAL | 0 refills | Status: DC

## 2020-02-06 MED ORDER — PROCHLORPERAZINE MALEATE 10 MG PO TABS: 10.0000 mg | ORAL_TABLET | Freq: Four times a day (QID) | ORAL | 1 refills | Status: DC | PRN

## 2020-02-06 MED ORDER — ONDANSETRON HCL 8 MG PO TABS: 8.0000 mg | ORAL_TABLET | Freq: Two times a day (BID) | ORAL | 1 refills | Status: AC

## 2020-02-06 NOTE — Telephone Encounter (Signed)
Called back and spoke with Karena Addison, nurse a [facility]. Allopurinol, Compazine and Zofran Rx need to be signed. She ask that Rx's be faxed to [facility] at [phone]

## 2020-02-06 NOTE — Telephone Encounter (Signed)
Received message from nurse at [facility] [place] that she needs to clarify orders.  Called back and left a message asking for a call back to the office.

## 2020-02-07 ENCOUNTER — Inpatient Hospital Stay: Payer: Medicare Other

## 2020-02-07 ENCOUNTER — Other Ambulatory Visit: Payer: Self-pay

## 2020-02-07 ENCOUNTER — Telehealth: Payer: Self-pay

## 2020-02-07 ENCOUNTER — Other Ambulatory Visit: Payer: Self-pay | Admitting: Hematology and Oncology

## 2020-02-07 VITALS — BP 107/50 | HR 87 | Temp 98.2°F | Resp 20

## 2020-02-07 DIAGNOSIS — C9110 Chronic lymphocytic leukemia of B-cell type not having achieved remission: Secondary | ICD-10-CM

## 2020-02-07 DIAGNOSIS — Z5111 Encounter for antineoplastic chemotherapy: Secondary | ICD-10-CM | POA: Diagnosis not present

## 2020-02-07 DIAGNOSIS — R5382 Chronic fatigue, unspecified: Secondary | ICD-10-CM | POA: Diagnosis not present

## 2020-02-07 DIAGNOSIS — Z9181 History of falling: Secondary | ICD-10-CM | POA: Diagnosis not present

## 2020-02-07 DIAGNOSIS — Z79899 Other long term (current) drug therapy: Secondary | ICD-10-CM | POA: Diagnosis not present

## 2020-02-07 LAB — COMPREHENSIVE METABOLIC PANEL
ALT: 10 U/L (ref 0–44)
AST: 17 U/L (ref 15–41)
Albumin: 3.9 g/dL (ref 3.5–5.0)
Alkaline Phosphatase: 86 U/L (ref 38–126)
Anion gap: 10 (ref 5–15)
BUN: 31 mg/dL — ABNORMAL HIGH (ref 8–23)
CO2: 26 mmol/L (ref 22–32)
Calcium: 9.1 mg/dL (ref 8.9–10.3)
Chloride: 103 mmol/L (ref 98–111)
Creatinine, Ser: 1.56 mg/dL — ABNORMAL HIGH (ref 0.44–1.00)
GFR calc Af Amer: 38 mL/min — ABNORMAL LOW (ref >60)
GFR calc non Af Amer: 32 mL/min — ABNORMAL LOW (ref >60)
Glucose, Bld: 107 mg/dL — ABNORMAL HIGH (ref 70–99)
Potassium: 4.6 mmol/L (ref 3.5–5.1)
Sodium: 139 mmol/L (ref 135–145)
Total Bilirubin: 0.7 mg/dL (ref 0.3–1.2)
Total Protein: 6.1 g/dL — ABNORMAL LOW (ref 6.5–8.1)

## 2020-02-07 LAB — CBC WITH DIFFERENTIAL/PLATELET
Abs Immature Granulocytes: 0.56 10*3/uL — ABNORMAL HIGH (ref 0.00–0.07)
Basophils Absolute: 0.1 10*3/uL (ref 0.0–0.1)
Basophils Relative: 0 %
Eosinophils Absolute: 0.3 10*3/uL (ref 0.0–0.5)
Eosinophils Relative: 0 %
HCT: 36.3 % (ref 36.0–46.0)
Hemoglobin: 11 g/dL — ABNORMAL LOW (ref 12.0–15.0)
Immature Granulocytes: 1 %
Lymphocytes Relative: 83 %
Lymphs Abs: 97.7 10*3/uL — ABNORMAL HIGH (ref 0.7–4.0)
MCH: 27.5 pg (ref 26.0–34.0)
MCHC: 30.3 g/dL (ref 30.0–36.0)
MCV: 90.8 fL (ref 80.0–100.0)
Monocytes Absolute: 9.5 10*3/uL — ABNORMAL HIGH (ref 0.1–1.0)
Monocytes Relative: 8 %
Neutro Abs: 9.3 10*3/uL — ABNORMAL HIGH (ref 1.7–7.7)
Neutrophils Relative %: 8 %
Platelets: 132 10*3/uL — ABNORMAL LOW (ref 150–400)
RBC: 4 MIL/uL (ref 3.87–5.11)
RDW: 16.1 % — ABNORMAL HIGH (ref 11.5–15.5)
WBC: 117.5 10*3/uL (ref 4.0–10.5)
nRBC: 0 % (ref 0.0–0.2)

## 2020-02-07 LAB — URIC ACID: Uric Acid, Serum: 5.1 mg/dL (ref 2.5–7.1)

## 2020-02-07 LAB — VITAMIN D 25 HYDROXY (VIT D DEFICIENCY, FRACTURES): Vit D, 25-Hydroxy: 31.91 ng/mL (ref 30–100)

## 2020-02-07 LAB — LACTATE DEHYDROGENASE: LDH: 162 U/L (ref 98–192)

## 2020-02-07 MED ORDER — SODIUM CHLORIDE 0.9% FLUSH
10.0000 mL | INTRAVENOUS | Status: DC | PRN
  Administered 2020-02-07: 10 mL
  Filled 2020-02-07: qty 10

## 2020-02-07 MED ORDER — SODIUM CHLORIDE 0.9 % IV SOLN
Freq: Once | INTRAVENOUS | Status: AC
  Filled 2020-02-07: qty 250

## 2020-02-07 MED ORDER — PALONOSETRON HCL INJECTION 0.25 MG/5ML
0.2500 mg | Freq: Once | INTRAVENOUS | Status: AC
  Administered 2020-02-07: 0.25 mg via INTRAVENOUS

## 2020-02-07 MED ORDER — PALONOSETRON HCL INJECTION 0.25 MG/5ML
INTRAVENOUS | Status: AC
  Filled 2020-02-07: qty 5

## 2020-02-07 MED ORDER — HEPARIN SOD (PORK) LOCK FLUSH 100 UNIT/ML IV SOLN
500.0000 [IU] | Freq: Once | INTRAVENOUS | Status: AC | PRN
  Administered 2020-02-07: 500 [IU]
  Filled 2020-02-07: qty 5

## 2020-02-07 MED ORDER — SODIUM CHLORIDE 0.9% FLUSH
10.0000 mL | Freq: Once | INTRAVENOUS | Status: AC
  Administered 2020-02-07: 10 mL
  Filled 2020-02-07: qty 10

## 2020-02-07 MED ORDER — SODIUM CHLORIDE 0.9 % IV SOLN
20.0000 mg | Freq: Once | INTRAVENOUS | Status: AC
  Administered 2020-02-07: 20 mg via INTRAVENOUS
  Filled 2020-02-07: qty 20

## 2020-02-07 MED ORDER — SODIUM CHLORIDE 0.9 % IV SOLN
70.0000 mg/m2 | Freq: Once | INTRAVENOUS | Status: AC
  Administered 2020-02-07: 125 mg via INTRAVENOUS
  Filled 2020-02-07: qty 5

## 2020-02-07 NOTE — Telephone Encounter (Signed)
Received message from Grant at [facility] to clarify Zofran Rx.  Called back per Dr. Alvy Bimler and left message that the Zofran is prn as needed.

## 2020-02-07 NOTE — Progress Notes (Signed)
Per Dr. Alvy Bimler, ok to proceed with Scr 1.56 and WBC of 117.5.

## 2020-02-07 NOTE — Telephone Encounter (Signed)
Faxed Rx's to [facility]. Received confirmation. Given original Rx's to sister in lobby.

## 2020-02-07 NOTE — Patient Instructions (Signed)
[facility] Discharge Instructions for Patients Receiving Chemotherapy  Today you received the following chemotherapy agents Bendeka.  To help prevent nausea and vomiting after your treatment, we encourage you to take your nausea medication as directed.   If you develop nausea and vomiting that is not controlled by your nausea medication, call the clinic.   BELOW ARE SYMPTOMS THAT SHOULD BE REPORTED IMMEDIATELY:  *FEVER GREATER THAN 100.5 F  *CHILLS WITH OR WITHOUT FEVER  NAUSEA AND VOMITING THAT IS NOT CONTROLLED WITH YOUR NAUSEA MEDICATION  *UNUSUAL SHORTNESS OF BREATH  *UNUSUAL BRUISING OR BLEEDING  TENDERNESS IN MOUTH AND THROAT WITH OR WITHOUT PRESENCE OF ULCERS  *URINARY PROBLEMS  *BOWEL PROBLEMS  UNUSUAL RASH Items with * indicate a potential emergency and should be followed up as soon as possible.  Feel free to call the clinic should you have any questions or concerns. The clinic phone number is [phone].  Please show the CHEMO ALERT CARD at check-in to the Emergency Department and triage nurse.   

## 2020-02-08 ENCOUNTER — Inpatient Hospital Stay: Payer: Medicare Other

## 2020-02-08 ENCOUNTER — Telehealth: Payer: Self-pay | Admitting: *Deleted

## 2020-02-08 ENCOUNTER — Other Ambulatory Visit: Payer: Self-pay

## 2020-02-08 VITALS — BP 117/50 | HR 75 | Temp 98.5°F | Resp 20

## 2020-02-08 DIAGNOSIS — R5382 Chronic fatigue, unspecified: Secondary | ICD-10-CM | POA: Diagnosis not present

## 2020-02-08 DIAGNOSIS — C9110 Chronic lymphocytic leukemia of B-cell type not having achieved remission: Secondary | ICD-10-CM

## 2020-02-08 DIAGNOSIS — Z5111 Encounter for antineoplastic chemotherapy: Secondary | ICD-10-CM | POA: Diagnosis not present

## 2020-02-08 DIAGNOSIS — Z79899 Other long term (current) drug therapy: Secondary | ICD-10-CM | POA: Diagnosis not present

## 2020-02-08 DIAGNOSIS — Z9181 History of falling: Secondary | ICD-10-CM | POA: Diagnosis not present

## 2020-02-08 MED ORDER — SODIUM CHLORIDE 0.9 % IV SOLN
10.0000 mg | Freq: Once | INTRAVENOUS | Status: AC
  Administered 2020-02-08: 10 mg via INTRAVENOUS
  Filled 2020-02-08: qty 10

## 2020-02-08 MED ORDER — SODIUM CHLORIDE 0.9% FLUSH
10.0000 mL | INTRAVENOUS | Status: DC | PRN
  Administered 2020-02-08: 10 mL
  Filled 2020-02-08: qty 10

## 2020-02-08 MED ORDER — SODIUM CHLORIDE 0.9 % IV SOLN
Freq: Once | INTRAVENOUS | Status: AC
  Filled 2020-02-08: qty 250

## 2020-02-08 MED ORDER — SODIUM CHLORIDE 0.9 % IV SOLN
70.0000 mg/m2 | Freq: Once | INTRAVENOUS | Status: AC
  Administered 2020-02-08: 125 mg via INTRAVENOUS
  Filled 2020-02-08: qty 5

## 2020-02-08 MED ORDER — HEPARIN SOD (PORK) LOCK FLUSH 100 UNIT/ML IV SOLN
500.0000 [IU] | Freq: Once | INTRAVENOUS | Status: AC | PRN
  Administered 2020-02-08: 500 [IU]
  Filled 2020-02-08: qty 5

## 2020-02-08 NOTE — Telephone Encounter (Signed)
Pt approved for Ondansetron 8mg  tab 02/07/20-02/07/21

## 2020-02-08 NOTE — Patient Instructions (Signed)
[facility] Discharge Instructions for Patients Receiving Chemotherapy  Today you received the following chemotherapy agents Bendeka  To help prevent nausea and vomiting after your treatment, we encourage you to take your nausea medication as directed   If you develop nausea and vomiting that is not controlled by your nausea medication, call the clinic.   BELOW ARE SYMPTOMS THAT SHOULD BE REPORTED IMMEDIATELY:  *FEVER GREATER THAN 100.5 F  *CHILLS WITH OR WITHOUT FEVER  NAUSEA AND VOMITING THAT IS NOT CONTROLLED WITH YOUR NAUSEA MEDICATION  *UNUSUAL SHORTNESS OF BREATH  *UNUSUAL BRUISING OR BLEEDING  TENDERNESS IN MOUTH AND THROAT WITH OR WITHOUT PRESENCE OF ULCERS  *URINARY PROBLEMS  *BOWEL PROBLEMS  UNUSUAL RASH Items with * indicate a potential emergency and should be followed up as soon as possible.  Feel free to call the clinic should you have any questions or concerns. The clinic phone number is [phone].  Please show the [place] at check-in to the Emergency Department and triage nurse.    Bendamustine (Bendeka) Injection What is this medicine? BENDAMUSTINE (BEN da MUS teen) is a chemotherapy drug. It is used to treat chronic lymphocytic leukemia and non-Hodgkin lymphoma. This medicine may be used for other purposes; ask your health care provider or pharmacist if you have questions. COMMON BRAND NAME(S): Kristine Royal, Treanda What should I tell my health care provider before I take this medicine? They need to know if you have any of these conditions:  infection (especially a virus infection such as chickenpox, cold sores, or herpes)  kidney disease  liver disease  an unusual or allergic reaction to bendamustine, mannitol, other medicines, foods, dyes, or preservatives  pregnant or trying to get pregnant  breast-feeding How should I use this medicine? This medicine is for infusion into a vein. It is given by a health care  professional in a hospital or clinic setting. Talk to your pediatrician regarding the use of this medicine in children. Special care may be needed. Overdosage: If you think you have taken too much of this medicine contact a poison control center or emergency room at once. NOTE: This medicine is only for you. Do not share this medicine with others. What if I miss a dose? It is important not to miss your dose. Call your doctor or health care professional if you are unable to keep an appointment. What may interact with this medicine? Do not take this medicine with any of the following medications:  clozapine This medicine may also interact with the following medications:  atazanavir  cimetidine  ciprofloxacin  enoxacin  fluvoxamine  medicines for seizures like carbamazepine and phenobarbital  mexiletine  rifampin  tacrine  thiabendazole  zileuton This list may not describe all possible interactions. Give your health care provider a list of all the medicines, herbs, non-prescription drugs, or dietary supplements you use. Also tell them if you smoke, drink alcohol, or use illegal drugs. Some items may interact with your medicine. What should I watch for while using this medicine? This drug may make you feel generally unwell. This is not uncommon, as chemotherapy can affect healthy cells as well as cancer cells. Report any side effects. Continue your course of treatment even though you feel ill unless your doctor tells you to stop. You may need blood work done while you are taking this medicine. Call your doctor or healthcare provider for advice if you get a fever, chills or sore throat, or other symptoms of a cold or  flu. Do not treat yourself. This drug decreases your body's ability to fight infections. Try to avoid being around people who are sick. This medicine may cause serious skin reactions. They can happen weeks to months after starting the medicine. Contact your healthcare  provider right away if you notice fevers or flu-like symptoms with a rash. The rash may be red or purple and then turn into blisters or peeling of the skin. Or, you might notice a red rash with swelling of the face, lips or lymph nodes in your neck or under your arms. This medicine may increase your risk to bruise or bleed. Call your doctor or healthcare provider if you notice any unusual bleeding. Talk to your doctor about your risk of cancer. You may be more at risk for certain types of cancers if you take this medicine. Do not become pregnant while taking this medicine or for at least 6 months after stopping it. Women should inform their doctor if they wish to become pregnant or think they might be pregnant. Men should not father a child while taking this medicine and for at least 3 months after stopping it. There is a potential for serious side effects to an unborn child. Talk to your healthcare provider or pharmacist for more information. Do not breast-feed an infant while taking this medicine or for at least 1 week after stopping it. This medicine may make it more difficult to father a child. You should talk with your doctor or healthcare provider if you are concerned about your fertility. What side effects may I notice from receiving this medicine? Side effects that you should report to your doctor or health care professional as soon as possible:  allergic reactions like skin rash, itching or hives, swelling of the face, lips, or tongue  low blood counts - this medicine may decrease the number of white blood cells, red blood cells and platelets. You may be at increased risk for infections and bleeding.  rash, fever, and swollen lymph nodes  redness, blistering, peeling, or loosening of the skin, including inside the mouth  signs of infection like fever or chills, cough, sore throat, pain or difficulty passing urine  signs of decreased platelets or bleeding like bruising, pinpoint red spots  on the skin, black, tarry stools, blood in the urine  signs of decreased red blood cells like being unusually weak or tired, fainting spells, lightheadedness  signs and symptoms of kidney injury like trouble passing urine or change in the amount of urine  signs and symptoms of liver injury like dark yellow or brown urine; general ill feeling or flu-like symptoms; light-colored stools; loss of appetite; nausea; right upper belly pain; unusually weak or tired; yellowing of the eyes or skin Side effects that usually do not require medical attention (report to your doctor or health care professional if they continue or are bothersome):  constipation  decreased appetite  diarrhea  headache  mouth sores  nausea, vomiting  tiredness This list may not describe all possible side effects. Call your doctor for medical advice about side effects. You may report side effects to FDA at 1-800-FDA-1088. Where should I keep my medicine? This drug is given in a hospital or clinic and will not be stored at home. NOTE: This sheet is a summary. It may not cover all possible information. If you have questions about this medicine, talk to your doctor, pharmacist, or health care provider.  2020 Elsevier/Gold Standard (2018-12-26 10:26:46)

## 2020-02-11 ENCOUNTER — Other Ambulatory Visit: Payer: Medicare Other

## 2020-02-11 DIAGNOSIS — M6281 Muscle weakness (generalized): Secondary | ICD-10-CM | POA: Diagnosis not present

## 2020-02-11 DIAGNOSIS — R262 Difficulty in walking, not elsewhere classified: Secondary | ICD-10-CM | POA: Diagnosis not present

## 2020-02-11 DIAGNOSIS — R2681 Unsteadiness on feet: Secondary | ICD-10-CM | POA: Diagnosis not present

## 2020-02-13 ENCOUNTER — Telehealth: Payer: Self-pay

## 2020-02-13 NOTE — Telephone Encounter (Signed)
Called and given below message to [facility]. She verbalized understanding. She will talk to [place] and call the office back.

## 2020-02-13 NOTE — Telephone Encounter (Signed)
I am trying to understand what family wants me to accomplish here She has shaking even before chemo so it's not related to chemo. Typically for tremors I refer patients to see neurologist No steroids for "tiredness". It is quite typical to feel fatigue after treatment but that will improve I can see her tomorrow at 945 or 1145 but just so she knows it may not address her needs

## 2020-02-13 NOTE — Telephone Encounter (Signed)
Sister called and left a message to call her.  Called back. Erin Moore has been having shaking in her hands for about 10 days. Even prior to treatment last week. She spilled her coffee recently because the shaking was so bad. Shaking is intermittent. She felt good for a few days after treatment. Now she is complaining of tiredness. Sister is asking if she could take steroids? [name] just talks with Erin Moore on the phone since she is at [facility]. She does not think she has other symptoms. She is asking if she should have a earlier appt? If you think you can do anything for her. Or wait until next scheduled appt?

## 2020-02-13 NOTE — Telephone Encounter (Signed)
Sister called back. They would like appt for tomorrow. Appt scheduled . She is aware of time.

## 2020-02-14 ENCOUNTER — Encounter: Payer: Self-pay | Admitting: Hematology and Oncology

## 2020-02-14 ENCOUNTER — Other Ambulatory Visit: Payer: Self-pay

## 2020-02-14 ENCOUNTER — Telehealth: Payer: Self-pay

## 2020-02-14 ENCOUNTER — Inpatient Hospital Stay: Payer: Medicare Other | Admitting: Hematology and Oncology

## 2020-02-14 ENCOUNTER — Inpatient Hospital Stay: Payer: Medicare Other

## 2020-02-14 VITALS — BP 111/90 | HR 70 | Temp 98.0°F | Resp 17 | Ht 63.0 in | Wt 255.0 lb

## 2020-02-14 DIAGNOSIS — C9110 Chronic lymphocytic leukemia of B-cell type not having achieved remission: Secondary | ICD-10-CM

## 2020-02-14 DIAGNOSIS — G6289 Other specified polyneuropathies: Secondary | ICD-10-CM | POA: Diagnosis not present

## 2020-02-14 DIAGNOSIS — R5382 Chronic fatigue, unspecified: Secondary | ICD-10-CM | POA: Diagnosis not present

## 2020-02-14 DIAGNOSIS — W19XXXA Unspecified fall, initial encounter: Secondary | ICD-10-CM | POA: Diagnosis not present

## 2020-02-14 DIAGNOSIS — Z9181 History of falling: Secondary | ICD-10-CM | POA: Diagnosis not present

## 2020-02-14 DIAGNOSIS — Y92009 Unspecified place in unspecified non-institutional (private) residence as the place of occurrence of the external cause: Secondary | ICD-10-CM

## 2020-02-14 DIAGNOSIS — R5383 Other fatigue: Secondary | ICD-10-CM

## 2020-02-14 DIAGNOSIS — G629 Polyneuropathy, unspecified: Secondary | ICD-10-CM | POA: Insufficient documentation

## 2020-02-14 DIAGNOSIS — Z5111 Encounter for antineoplastic chemotherapy: Secondary | ICD-10-CM | POA: Diagnosis not present

## 2020-02-14 DIAGNOSIS — Z79899 Other long term (current) drug therapy: Secondary | ICD-10-CM | POA: Diagnosis not present

## 2020-02-14 LAB — CBC WITH DIFFERENTIAL/PLATELET
Abs Immature Granulocytes: 0.33 10*3/uL — ABNORMAL HIGH (ref 0.00–0.07)
Basophils Absolute: 0 10*3/uL (ref 0.0–0.1)
Basophils Relative: 0 %
Eosinophils Absolute: 0.5 10*3/uL (ref 0.0–0.5)
Eosinophils Relative: 1 %
HCT: 37.1 % (ref 36.0–46.0)
Hemoglobin: 11.4 g/dL — ABNORMAL LOW (ref 12.0–15.0)
Immature Granulocytes: 1 %
Lymphocytes Relative: 78 %
Lymphs Abs: 55.3 10*3/uL — ABNORMAL HIGH (ref 0.7–4.0)
MCH: 27.9 pg (ref 26.0–34.0)
MCHC: 30.7 g/dL (ref 30.0–36.0)
MCV: 90.7 fL (ref 80.0–100.0)
Monocytes Absolute: 3.8 10*3/uL — ABNORMAL HIGH (ref 0.1–1.0)
Monocytes Relative: 6 %
Neutro Abs: 9.9 10*3/uL — ABNORMAL HIGH (ref 1.7–7.7)
Neutrophils Relative %: 14 %
Platelets: 143 10*3/uL — ABNORMAL LOW (ref 150–400)
RBC: 4.09 MIL/uL (ref 3.87–5.11)
RDW: 16.4 % — ABNORMAL HIGH (ref 11.5–15.5)
WBC: 69.9 10*3/uL (ref 4.0–10.5)
nRBC: 0 % (ref 0.0–0.2)

## 2020-02-14 LAB — URINALYSIS, COMPLETE (UACMP) WITH MICROSCOPIC
Bilirubin Urine: NEGATIVE
Glucose, UA: NEGATIVE mg/dL
Hgb urine dipstick: NEGATIVE
Ketones, ur: NEGATIVE mg/dL
Nitrite: NEGATIVE
Protein, ur: NEGATIVE mg/dL
Specific Gravity, Urine: 1.017 (ref 1.005–1.030)
pH: 6 (ref 5.0–8.0)

## 2020-02-14 LAB — COMPREHENSIVE METABOLIC PANEL
ALT: 11 U/L (ref 0–44)
AST: 10 U/L — ABNORMAL LOW (ref 15–41)
Albumin: 3.9 g/dL (ref 3.5–5.0)
Alkaline Phosphatase: 75 U/L (ref 38–126)
Anion gap: 10 (ref 5–15)
BUN: 39 mg/dL — ABNORMAL HIGH (ref 8–23)
CO2: 29 mmol/L (ref 22–32)
Calcium: 9.5 mg/dL (ref 8.9–10.3)
Chloride: 102 mmol/L (ref 98–111)
Creatinine, Ser: 1.84 mg/dL — ABNORMAL HIGH (ref 0.44–1.00)
GFR calc Af Amer: 31 mL/min — ABNORMAL LOW (ref >60)
GFR calc non Af Amer: 27 mL/min — ABNORMAL LOW (ref >60)
Glucose, Bld: 102 mg/dL — ABNORMAL HIGH (ref 70–99)
Potassium: 5.1 mmol/L (ref 3.5–5.1)
Sodium: 141 mmol/L (ref 135–145)
Total Bilirubin: 0.8 mg/dL (ref 0.3–1.2)
Total Protein: 6.2 g/dL — ABNORMAL LOW (ref 6.5–8.1)

## 2020-02-14 LAB — VITAMIN B12: Vitamin B-12: 212 pg/mL (ref 180–914)

## 2020-02-14 LAB — T4, FREE: Free T4: 0.64 ng/dL (ref 0.61–1.12)

## 2020-02-14 LAB — HEMOGLOBIN A1C
Hgb A1c MFr Bld: 5.5 % (ref 4.8–5.6)
Mean Plasma Glucose: 111.15 mg/dL

## 2020-02-14 LAB — SEDIMENTATION RATE: Sed Rate: 20 mm/hr (ref 0–22)

## 2020-02-14 LAB — TSH: TSH: 5.156 u[IU]/mL — ABNORMAL HIGH (ref 0.308–3.960)

## 2020-02-14 NOTE — Assessment & Plan Note (Addendum)
In addition to balance issue, she also described what sounds like peripheral neuropathy I will check vitamin B12 level to exclude vitamin B12 as a cause of her neuropathy Bendamustine should not cause neuropathy I will refer her to see neurologist for further evaluation I also plan to check hemoglobin A1c to exclude undiagnosed diabetes as a cause of her neuropathy

## 2020-02-14 NOTE — Assessment & Plan Note (Signed)
She complained of excessive fatigue I will check TSH and free thyroxine for further evaluation

## 2020-02-14 NOTE — Assessment & Plan Note (Signed)
She has multiple, recurrent, frequent falls due to balance difficulties She also have resting tremor which is not present today I will check TSH level I will check B12 level I recommend neurology consult and she agreed

## 2020-02-14 NOTE — Telephone Encounter (Signed)
CRITICAL VALUE STICKER  CRITICAL VALUE: WBC 69.9  RECEIVER (on-site recipient of call): Lenox Ponds LPN  DATE & TIME NOTIFIED: 02/14/20 1224  MESSENGER (representative from lab):[facility]  MD NOTIFIED: Dr. Alvy Bimler  TIME OF NOTIFICATION: 12:33  RESPONSE: Dr. Ree Kida

## 2020-02-14 NOTE — Assessment & Plan Note (Signed)
From the CLL standpoint, she is responding to treatment well without major side effects I told her that her balance difficulties and neuropathy are not related to CLL We will continue aggressive supportive care

## 2020-02-14 NOTE — Progress Notes (Signed)
[facility] OFFICE PROGRESS NOTE  Patient Care Team: Jonathon Jordan, MD as PCP - General (Family Medicine)  ASSESSMENT & PLAN:  CLL (chronic lymphocytic leukemia) ([place]) From the CLL standpoint, she is responding to treatment well without major side effects I told her that her balance difficulties and neuropathy are not related to CLL We will continue aggressive supportive care  Peripheral neuropathy In addition to balance issue, she also described what sounds like peripheral neuropathy I will check vitamin B12 level to exclude vitamin B12 as a cause of her neuropathy Bendamustine should not cause neuropathy I will refer her to see neurologist for further evaluation I also plan to check hemoglobin A1c to exclude undiagnosed diabetes as a cause of her neuropathy  Fall at home, initial encounter She has multiple, recurrent, frequent falls due to balance difficulties She also have resting tremor which is not present today I will check TSH level I will check B12 level I recommend neurology consult and she agreed  Other fatigue She complained of excessive fatigue I will check TSH and free thyroxine for further evaluation   Orders Placed This Encounter  Procedures  . TSH    Standing Status:   Future    Number of Occurrences:   1    Standing Expiration Date:   03/20/2021  . T4, free    Standing Status:   Future    Number of Occurrences:   1    Standing Expiration Date:   03/20/2021  . Vitamin B12    Standing Status:   Future    Number of Occurrences:   1    Standing Expiration Date:   03/20/2021  . Sedimentation rate    Standing Status:   Future    Number of Occurrences:   1    Standing Expiration Date:   03/20/2021  . Urinalysis, Complete w Microscopic    Standing Status:   Future    Number of Occurrences:   1    Standing Expiration Date:   03/20/2021  . Comprehensive metabolic panel    Standing Status:   Future    Number of Occurrences:   1    Standing  Expiration Date:   03/20/2021  . CBC with Differential/Platelet    Standing Status:   Future    Number of Occurrences:   1    Standing Expiration Date:   03/20/2021  . Hemoglobin A1c    Standing Status:   Future    Number of Occurrences:   1    Standing Expiration Date:   02/13/2021  . Ambulatory referral to Neurology    Referral Priority:   Routine    Referral Type:   Consultation    Referral Reason:   Specialty Services Required    Requested Specialty:   Neurology    Number of Visits Requested:   1    All questions were answered. The patient knows to call the clinic with any problems, questions or concerns. The total time spent in the appointment was 30 minutes encounter with patients including review of chart and various tests results, discussions about plan of care and coordination of care plan   Heath Lark, MD 02/14/2020 1:15 PM  INTERVAL HISTORY: Please see below for problem oriented charting. She is seen today per family request The patient has been complaining of tremor even before chemotherapy She had recurrent falls She had another fall 2 days ago due to balance difficulties According to the patient, she did not use  a walker and fell but did not injure herself She has numbness on both legs which are new She complains of fatigue since chemotherapy No nausea or vomiting  SUMMARY OF ONCOLOGIC HISTORY: Oncology History Overview Note  FISH del 13 q   CLL (chronic lymphocytic leukemia) ([place])  07/02/2010 Procedure   LN biopsy confirmed CLL   08/18/2010 Procedure   Bone marrow biopsy confirmed CLL   11/18/2010 - 04/15/2011 Chemotherapy   patient received 6 cycles of FLudarabine and Rituximab   01/13/2012 Relapse/Recurrence   Repeat BM biopsy confirmed relapse   01/27/2012 - 06/23/2012 Chemotherapy   Bendamustine & Rituximab given, complicated by infusion reaction to Rituximab, completed 6 cycles   06/29/2013 Imaging   Ct scan confirmed disease relapse with bulky  lymphadenopathy   07/23/2013 Procedure   Repeat BM biopsy due to thrombocytopenia and progressive leukocytosis   08/02/2013 Relapse/Recurrence   Patient consented to start iburitinib as salvage therapy for relapsed CLL   11/09/2013 Imaging   Repeat CT scan the chest, abdomen and pelvis showed greater than 50% response to treatment   06/17/2014 Imaging   Repeat CT scan showed near complete response to treatment.   05/30/2015 Imaging   Repeat CT scan showed near complete response to treatment.   06/14/2016 Imaging   CT scan showed stable scattered small retroperitoneal lymph nodes and pelvic lymph nodes. No findings for recurrent or progressive lymphoma. Stable mild splenomegaly.   12/30/2017 Imaging   1. Recurrent adenopathy within the chest, abdomen, and pelvis. 2. Splenomegaly. 3. Aortic Atherosclerosis (ICD10-I70.0). 4. Coronary artery calcifications.   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 The catheter is ready for immediate use.   01/24/2018 -  Chemotherapy   The patient had Gazyva and 1 dose of bendamustine. Bendamustine was discontinued due to inability to get insurance approval   05/08/2018 Imaging   1. Interval partial treatment response. Bilateral axillary, mediastinal, right hilar, retroperitoneal and bilateral pelvic adenopathy is all decreased. Mild splenomegaly, decreased. No new or progressive disease. 2. Aortic Atherosclerosis (ICD10-I70.0).   01/31/2020 Imaging   1. Since 12/29/2017, marked progression of adenopathy within the chest, abdomen, and pelvis as detailed above. 2. Progressive splenomegaly, consistent with splenic involvement. 3. New pulmonary parenchymal findings which could represent interval infection (including atypical etiologies) or aspiration. 4. Coronary artery atherosclerosis. Aortic  Atherosclerosis (ICD10-I70.0).   02/07/2020 -  Chemotherapy   The patient had palonosetron (ALOXI) injection 0.25 mg, 0.25 mg, Intravenous,  Once, 1 of 6 cycles Administration: 0.25 mg (02/07/2020) bendamustine (BENDEKA) 125 mg in sodium chloride 0.9 % 50 mL (2.2727 mg/mL) chemo infusion, 70 mg/m2 = 125 mg (100 % of original dose 70 mg/m2), Intravenous,  Once, 1 of 6 cycles Dose modification: 70 mg/m2 (original dose 70 mg/m2, Cycle 1, Reason: Provider Judgment) Administration: 125 mg (02/07/2020), 125 mg (02/08/2020) obinutuzumab (GAZYVA) 1,000 mg in sodium chloride 0.9 % 250 mL (3.4483 mg/mL) chemo infusion, 1,000 mg, Intravenous, Once, 0 of 4 cycles  for chemotherapy treatment.      REVIEW OF SYSTEMS:   Constitutional: Denies fevers, chills or abnormal weight loss Eyes: Denies blurriness of vision Ears, nose, mouth, throat, and face: Denies mucositis or sore throat Respiratory: Denies cough, dyspnea or wheezes Cardiovascular: Denies palpitation, chest discomfort or lower extremity swelling Gastrointestinal:  Denies nausea, heartburn or change in bowel habits Skin: Denies abnormal skin rashes  Lymphatics: Denies new lymphadenopathy or easy bruising Behavioral/Psych: Mood is stable, no new changes  All other systems were reviewed with the patient and are negative.  I have reviewed the past medical history, past surgical history, social history and family history with the patient and they are unchanged from previous note.  ALLERGIES:  is allergic to azithromycin and ibuprofen.  MEDICATIONS:  Current Outpatient Medications  Medication Sig Dispense Refill  . acetaminophen (TYLENOL) 325 MG tablet Take 650 mg by mouth every 6 (six) hours as needed.    Marland Kitchen acyclovir (ZOVIRAX) 400 MG tablet Take 1 tablet (400 mg total) by mouth daily. 30 tablet 5  . albuterol (VENTOLIN HFA) 108 (90 Base) MCG/ACT inhaler Inhale 2 puffs into the lungs in the morning and at bedtime.    Marland Kitchen allopurinol (ZYLOPRIM) 300  MG tablet Take 1 tablet (300 mg total) by mouth daily. 30 tablet 0  . amLODipine (NORVASC) 10 MG tablet TAKE 1 TABLET (10 MG TOTAL) BY MOUTH DAILY. 90 tablet 1  . azelastine (ASTELIN) 0.1 % nasal spray Place 1 spray into both nostrils 2 (two) times daily. Use in each nostril as directed    . bisoprolol (ZEBETA) 5 MG tablet Take 10 mg by mouth daily.   10  . Carboxymethylcellul-Glycerin (REFRESH OPTIVE SENSITIVE OP) Place 1 drop into both eyes as needed.     . citalopram (CELEXA) 20 MG tablet Take 20 mg by mouth every morning.     . fluticasone-salmeterol (ADVAIR HFA) 115-21 MCG/ACT inhaler Inhale 2 puffs into the lungs 2 (two) times daily.    Marland Kitchen gabapentin (NEURONTIN) 300 MG capsule Take 300 mg by mouth at bedtime.    . hydrochlorothiazide (HYDRODIURIL) 25 MG tablet Take 25 mg by mouth daily.    Marland Kitchen levocetirizine (XYZAL) 5 MG tablet Take 5 mg by mouth every evening.    . lidocaine-prilocaine (EMLA) cream Apply to affected area once 30 g 5  . losartan-hydrochlorothiazide (HYZAAR) 100-25 MG tablet Take by mouth daily.   9  . Multiple Vitamins-Minerals (PRESERVISION AREDS 2) CAPS Take 1 capsule by mouth 2 (two) times daily.    . ondansetron (ZOFRAN) 8 MG tablet Take 1 tablet (8 mg total) by mouth 2 (two) times daily. Start second day after chemotherapy. Then take as needed for nausea or vomiting. 30 tablet 1  . prochlorperazine (COMPAZINE) 10 MG tablet Take 1 tablet (10 mg total) by mouth every 6 (six) hours as needed (Nausea or vomiting). 30 tablet 1  . sulfamethoxazole-trimethoprim (BACTRIM DS) 800-160 MG tablet Take 1 tablet by mouth 3 (three) times a week. 12 tablet 5  . triamcinolone (NASACORT) 55 MCG/ACT AERO nasal inhaler Place 2 sprays into the nose daily. 1 Inhaler 12  . Vitamin D, Ergocalciferol, 2000 units CAPS Take by mouth.     No current facility-administered medications for this visit.    PHYSICAL EXAMINATION: ECOG PERFORMANCE STATUS: 1 - Symptomatic but completely  ambulatory  Vitals:   02/14/20 1139  BP: 111/90  Pulse: 70  Resp: 17  Temp: 98 F (36.7 C)  SpO2: 99%   Filed Weights   02/14/20 1139  Weight: 255 lb (115.7 kg)    GENERAL:alert, no distress and comfortable SKIN: skin color, texture, turgor are normal, no rashes or significant lesions EYES: normal, Conjunctiva are pink and non-injected, sclera clear OROPHARYNX:no exudate, no erythema and lips, buccal mucosa, and tongue normal  NECK: supple, thyroid normal size, non-tender, without nodularity LYMPH:  no palpable lymphadenopathy in the cervical, axillary or  inguinal LUNGS: clear to auscultation and percussion with normal breathing effort HEART: regular rate & rhythm and no murmurs and no lower extremity edema ABDOMEN:abdomen soft, non-tender and normal bowel sounds Musculoskeletal:no cyanosis of digits and no clubbing  NEURO: alert & oriented x 3 with fluent speech, no focal motor/sensory deficits  LABORATORY DATA:  I have reviewed the data as listed    Component Value Date/Time   NA 141 02/14/2020 1159   NA 141 08/23/2017 1026   K 5.1 02/14/2020 1159   K 3.7 08/23/2017 1026   CL 102 02/14/2020 1159   CL 98 02/02/2013 1353   CO2 29 02/14/2020 1159   CO2 33 (H) 08/23/2017 1026   GLUCOSE 102 (H) 02/14/2020 1159   GLUCOSE 92 08/23/2017 1026   GLUCOSE 99 02/02/2013 1353   BUN 39 (H) 02/14/2020 1159   BUN 21.0 08/23/2017 1026   CREATININE 1.84 (H) 02/14/2020 1159   CREATININE 1.25 (H) 11/27/2018 1127   CREATININE 1.2 (H) 08/23/2017 1026   CALCIUM 9.5 02/14/2020 1159   CALCIUM 9.3 08/23/2017 1026   PROT 6.2 (L) 02/14/2020 1159   PROT 6.3 (L) 08/23/2017 1026   ALBUMIN 3.9 02/14/2020 1159   ALBUMIN 3.8 08/23/2017 1026   AST 10 (L) 02/14/2020 1159   AST 15 11/27/2018 1127   AST 11 08/23/2017 1026   ALT 11 02/14/2020 1159   ALT 20 11/27/2018 1127   ALT 10 08/23/2017 1026   ALKPHOS 75 02/14/2020 1159   ALKPHOS 66 08/23/2017 1026   BILITOT 0.8 02/14/2020 1159    BILITOT 0.4 11/27/2018 1127   BILITOT 0.59 08/23/2017 1026   GFRNONAA 27 (L) 02/14/2020 1159   GFRNONAA 43 (L) 11/27/2018 1127   GFRAA 31 (L) 02/14/2020 1159   GFRAA 50 (L) 11/27/2018 1127    No results found for: SPEP, UPEP  Lab Results  Component Value Date   WBC 69.9 (HH) 02/14/2020   NEUTROABS 9.9 (H) 02/14/2020   HGB 11.4 (L) 02/14/2020   HCT 37.1 02/14/2020   MCV 90.7 02/14/2020   PLT 143 (L) 02/14/2020      Chemistry      Component Value Date/Time   NA 141 02/14/2020 1159   NA 141 08/23/2017 1026   K 5.1 02/14/2020 1159   K 3.7 08/23/2017 1026   CL 102 02/14/2020 1159   CL 98 02/02/2013 1353   CO2 29 02/14/2020 1159   CO2 33 (H) 08/23/2017 1026   BUN 39 (H) 02/14/2020 1159   BUN 21.0 08/23/2017 1026   CREATININE 1.84 (H) 02/14/2020 1159   CREATININE 1.25 (H) 11/27/2018 1127   CREATININE 1.2 (H) 08/23/2017 1026      Component Value Date/Time   CALCIUM 9.5 02/14/2020 1159   CALCIUM 9.3 08/23/2017 1026   ALKPHOS 75 02/14/2020 1159   ALKPHOS 66 08/23/2017 1026   AST 10 (L) 02/14/2020 1159   AST 15 11/27/2018 1127   AST 11 08/23/2017 1026   ALT 11 02/14/2020 1159   ALT 20 11/27/2018 1127   ALT 10 08/23/2017 1026   BILITOT 0.8 02/14/2020 1159   BILITOT 0.4 11/27/2018 1127   BILITOT 0.59 08/23/2017 1026

## 2020-02-15 ENCOUNTER — Other Ambulatory Visit: Payer: Self-pay | Admitting: Hematology and Oncology

## 2020-02-15 ENCOUNTER — Telehealth: Payer: Self-pay

## 2020-02-15 DIAGNOSIS — R2681 Unsteadiness on feet: Secondary | ICD-10-CM | POA: Diagnosis not present

## 2020-02-15 DIAGNOSIS — R262 Difficulty in walking, not elsewhere classified: Secondary | ICD-10-CM | POA: Diagnosis not present

## 2020-02-15 DIAGNOSIS — M6281 Muscle weakness (generalized): Secondary | ICD-10-CM | POA: Diagnosis not present

## 2020-02-15 NOTE — Telephone Encounter (Signed)
-----   Message from Heath Lark, MD sent at 02/15/2020  8:51 AM EDT ----- Regarding: call sister Erin Moore Let her know CBC looks good, she has dropped WBC by 1/2 already Other labs showed B12 is borderline low. I will add B12 injection when she comes in her next appt I sent order for neurology consult; they should call her directly for appt

## 2020-02-15 NOTE — Telephone Encounter (Signed)
Called and given below message. She verbalized understanding. 

## 2020-02-18 DIAGNOSIS — R2681 Unsteadiness on feet: Secondary | ICD-10-CM | POA: Diagnosis not present

## 2020-02-18 DIAGNOSIS — M6281 Muscle weakness (generalized): Secondary | ICD-10-CM | POA: Diagnosis not present

## 2020-02-18 DIAGNOSIS — R262 Difficulty in walking, not elsewhere classified: Secondary | ICD-10-CM | POA: Diagnosis not present

## 2020-02-20 DIAGNOSIS — R2681 Unsteadiness on feet: Secondary | ICD-10-CM | POA: Diagnosis not present

## 2020-02-20 DIAGNOSIS — R262 Difficulty in walking, not elsewhere classified: Secondary | ICD-10-CM | POA: Diagnosis not present

## 2020-02-20 DIAGNOSIS — M6281 Muscle weakness (generalized): Secondary | ICD-10-CM | POA: Diagnosis not present

## 2020-02-22 DIAGNOSIS — M6281 Muscle weakness (generalized): Secondary | ICD-10-CM | POA: Diagnosis not present

## 2020-02-22 DIAGNOSIS — R262 Difficulty in walking, not elsewhere classified: Secondary | ICD-10-CM | POA: Diagnosis not present

## 2020-02-22 DIAGNOSIS — R2681 Unsteadiness on feet: Secondary | ICD-10-CM | POA: Diagnosis not present

## 2020-02-25 DIAGNOSIS — R2681 Unsteadiness on feet: Secondary | ICD-10-CM | POA: Diagnosis not present

## 2020-02-25 DIAGNOSIS — R262 Difficulty in walking, not elsewhere classified: Secondary | ICD-10-CM | POA: Diagnosis not present

## 2020-02-25 DIAGNOSIS — M6281 Muscle weakness (generalized): Secondary | ICD-10-CM | POA: Diagnosis not present

## 2020-02-27 DIAGNOSIS — M6281 Muscle weakness (generalized): Secondary | ICD-10-CM | POA: Diagnosis not present

## 2020-02-27 DIAGNOSIS — R262 Difficulty in walking, not elsewhere classified: Secondary | ICD-10-CM | POA: Diagnosis not present

## 2020-02-27 DIAGNOSIS — R2681 Unsteadiness on feet: Secondary | ICD-10-CM | POA: Diagnosis not present

## 2020-02-27 NOTE — Progress Notes (Signed)
Pharmacist Chemotherapy Monitoring - Follow Up Assessment    I verify that I have reviewed each item in the below checklist:  . Regimen for the patient is scheduled for the appropriate day and plan matches scheduled date. Marland Kitchen Appropriate non-routine labs are ordered dependent on drug ordered. . If applicable, additional medications reviewed and ordered per protocol based on lifetime cumulative doses and/or treatment regimen.   Plan for follow-up and/or issues identified: No . I-vent associated with next due treatment: No . MD and/or nursing notified: No  Laine Fonner D 02/27/2020 10:17 AM

## 2020-02-28 ENCOUNTER — Telehealth: Payer: Self-pay

## 2020-02-28 NOTE — Progress Notes (Signed)
Pharmacist Chemotherapy Monitoring - Follow Up Assessment    I verify that I have reviewed each item in the below checklist:  . Regimen for the patient is scheduled for the appropriate day and plan matches scheduled date. Marland Kitchen Appropriate non-routine labs are ordered dependent on drug ordered. . If applicable, additional medications reviewed and ordered per protocol based on lifetime cumulative doses and/or treatment regimen.   Plan for follow-up and/or issues identified: No . I-vent associated with next due treatment: Yes . MD and/or nursing notified: No  Erin Moore K 02/28/2020 11:19 AM

## 2020-02-28 NOTE — Telephone Encounter (Signed)
Called and left below message. Ask sister to call the office back.

## 2020-02-28 NOTE — Telephone Encounter (Signed)
-----   Message from Heath Lark, MD sent at 02/28/2020 12:58 PM EDT ----- Regarding: i need you to call her sister Pls call her sister Her insurance has denied treatment Asks whether she wants to keep appt to discuss and at least have labs drawn? Unless they are willing to pay out of pocket we have to cancel her treatment

## 2020-02-29 DIAGNOSIS — R2681 Unsteadiness on feet: Secondary | ICD-10-CM | POA: Diagnosis not present

## 2020-02-29 DIAGNOSIS — M6281 Muscle weakness (generalized): Secondary | ICD-10-CM | POA: Diagnosis not present

## 2020-02-29 DIAGNOSIS — R262 Difficulty in walking, not elsewhere classified: Secondary | ICD-10-CM | POA: Diagnosis not present

## 2020-02-29 NOTE — Telephone Encounter (Signed)
Pls contact Ulice Dash to see if she knows treatment cost of [place]

## 2020-02-29 NOTE — Telephone Encounter (Signed)
Called back and spoke with sister, who is guardian. Treatment canceled for 5/18 and 5/19. She is upset and has a lot of questions. For next office visit. Sister is wanting to discuss if the treatment ordered in medically necessary? Alternative treatment? The cost of treatment that was denied? If she knew the cost she could make a decision.

## 2020-02-29 NOTE — Telephone Encounter (Signed)
Email sent ot Ulice Dash to ask the cost of treatment.

## 2020-03-03 DIAGNOSIS — R262 Difficulty in walking, not elsewhere classified: Secondary | ICD-10-CM | POA: Diagnosis not present

## 2020-03-03 DIAGNOSIS — R2681 Unsteadiness on feet: Secondary | ICD-10-CM | POA: Diagnosis not present

## 2020-03-03 DIAGNOSIS — M6281 Muscle weakness (generalized): Secondary | ICD-10-CM | POA: Diagnosis not present

## 2020-03-04 ENCOUNTER — Inpatient Hospital Stay: Payer: Medicare Other

## 2020-03-04 ENCOUNTER — Other Ambulatory Visit: Payer: Self-pay

## 2020-03-04 ENCOUNTER — Telehealth: Payer: Self-pay

## 2020-03-04 ENCOUNTER — Inpatient Hospital Stay: Payer: Medicare Other | Attending: Hematology and Oncology | Admitting: Hematology and Oncology

## 2020-03-04 ENCOUNTER — Encounter: Payer: Self-pay | Admitting: Hematology and Oncology

## 2020-03-04 ENCOUNTER — Telehealth: Payer: Self-pay | Admitting: Hematology and Oncology

## 2020-03-04 ENCOUNTER — Ambulatory Visit: Payer: Medicare Other

## 2020-03-04 ENCOUNTER — Other Ambulatory Visit: Payer: Self-pay | Admitting: Hematology and Oncology

## 2020-03-04 DIAGNOSIS — Z5111 Encounter for antineoplastic chemotherapy: Secondary | ICD-10-CM | POA: Diagnosis not present

## 2020-03-04 DIAGNOSIS — C9110 Chronic lymphocytic leukemia of B-cell type not having achieved remission: Secondary | ICD-10-CM

## 2020-03-04 DIAGNOSIS — D61818 Other pancytopenia: Secondary | ICD-10-CM | POA: Diagnosis not present

## 2020-03-04 DIAGNOSIS — N183 Chronic kidney disease, stage 3 unspecified: Secondary | ICD-10-CM | POA: Diagnosis not present

## 2020-03-04 LAB — CBC WITH DIFFERENTIAL (CANCER CENTER ONLY)
Abs Immature Granulocytes: 0.2 10*3/uL — ABNORMAL HIGH (ref 0.00–0.07)
Basophils Absolute: 0.2 10*3/uL — ABNORMAL HIGH (ref 0.0–0.1)
Basophils Relative: 1 %
Eosinophils Absolute: 0.7 10*3/uL — ABNORMAL HIGH (ref 0.0–0.5)
Eosinophils Relative: 4 %
HCT: 36.5 % (ref 36.0–46.0)
Hemoglobin: 11.5 g/dL — ABNORMAL LOW (ref 12.0–15.0)
Lymphocytes Relative: 69 %
Lymphs Abs: 12.6 10*3/uL — ABNORMAL HIGH (ref 0.7–4.0)
MCH: 28.5 pg (ref 26.0–34.0)
MCHC: 31.5 g/dL (ref 30.0–36.0)
MCV: 90.6 fL (ref 80.0–100.0)
Metamyelocytes Relative: 1 %
Monocytes Absolute: 0.4 10*3/uL (ref 0.1–1.0)
Monocytes Relative: 2 %
Neutro Abs: 4.2 10*3/uL (ref 1.7–7.7)
Neutrophils Relative %: 23 %
Platelet Count: 160 10*3/uL (ref 150–400)
RBC: 4.03 MIL/uL (ref 3.87–5.11)
RDW: 15.9 % — ABNORMAL HIGH (ref 11.5–15.5)
WBC Count: 18.3 10*3/uL — ABNORMAL HIGH (ref 4.0–10.5)
nRBC: 0 % (ref 0.0–0.2)

## 2020-03-04 LAB — CMP (CANCER CENTER ONLY)
ALT: 14 U/L (ref 0–44)
AST: 14 U/L — ABNORMAL LOW (ref 15–41)
Albumin: 3.8 g/dL (ref 3.5–5.0)
Alkaline Phosphatase: 82 U/L (ref 38–126)
Anion gap: 12 (ref 5–15)
BUN: 21 mg/dL (ref 8–23)
CO2: 27 mmol/L (ref 22–32)
Calcium: 9.5 mg/dL (ref 8.9–10.3)
Chloride: 102 mmol/L (ref 98–111)
Creatinine: 1.27 mg/dL — ABNORMAL HIGH (ref 0.44–1.00)
GFR, Est AFR Am: 48 mL/min — ABNORMAL LOW (ref >60)
GFR, Estimated: 42 mL/min — ABNORMAL LOW (ref >60)
Glucose, Bld: 99 mg/dL (ref 70–99)
Potassium: 4.5 mmol/L (ref 3.5–5.1)
Sodium: 141 mmol/L (ref 135–145)
Total Bilirubin: 0.6 mg/dL (ref 0.3–1.2)
Total Protein: 6.1 g/dL — ABNORMAL LOW (ref 6.5–8.1)

## 2020-03-04 MED ORDER — HEPARIN SOD (PORK) LOCK FLUSH 100 UNIT/ML IV SOLN
500.0000 [IU] | Freq: Once | INTRAVENOUS | Status: AC | PRN
  Administered 2020-03-04: 500 [IU]
  Filled 2020-03-04: qty 5

## 2020-03-04 MED ORDER — PALONOSETRON HCL INJECTION 0.25 MG/5ML
INTRAVENOUS | Status: AC
  Filled 2020-03-04: qty 5

## 2020-03-04 MED ORDER — SODIUM CHLORIDE 0.9% FLUSH
10.0000 mL | INTRAVENOUS | Status: DC | PRN
  Administered 2020-03-04: 10 mL
  Filled 2020-03-04: qty 10

## 2020-03-04 MED ORDER — SODIUM CHLORIDE 0.9 % IV SOLN
70.0000 mg/m2 | Freq: Once | INTRAVENOUS | Status: AC
  Administered 2020-03-04: 125 mg via INTRAVENOUS
  Filled 2020-03-04: qty 5

## 2020-03-04 MED ORDER — CYANOCOBALAMIN 1000 MCG/ML IJ SOLN
1000.0000 ug | Freq: Once | INTRAMUSCULAR | Status: AC
  Administered 2020-03-04: 1000 ug via INTRAMUSCULAR

## 2020-03-04 MED ORDER — CYANOCOBALAMIN 1000 MCG/ML IJ SOLN
INTRAMUSCULAR | Status: AC
  Filled 2020-03-04: qty 1

## 2020-03-04 MED ORDER — SODIUM CHLORIDE 0.9 % IV SOLN
10.0000 mg | Freq: Once | INTRAVENOUS | Status: AC
  Administered 2020-03-04: 10 mg via INTRAVENOUS
  Filled 2020-03-04: qty 10

## 2020-03-04 MED ORDER — SODIUM CHLORIDE 0.9 % IV SOLN
Freq: Once | INTRAVENOUS | Status: AC
  Filled 2020-03-04: qty 250

## 2020-03-04 MED ORDER — PALONOSETRON HCL INJECTION 0.25 MG/5ML
0.2500 mg | Freq: Once | INTRAVENOUS | Status: AC
  Administered 2020-03-04: 0.25 mg via INTRAVENOUS

## 2020-03-04 NOTE — Telephone Encounter (Signed)
Scheduled appt per 5/18 sch message- pt gaurdian aware of appt added and pt to get an updated schedule tomorrow

## 2020-03-04 NOTE — Telephone Encounter (Signed)
Given appt time for infusion for tomorrow at 9 am. Sister verbalized understanding.

## 2020-03-04 NOTE — Progress Notes (Signed)
[facility] OFFICE PROGRESS NOTE  Patient Care Team: Jonathon Jordan, MD as PCP - General (Family Medicine)  ASSESSMENT & PLAN:  CLL (chronic lymphocytic leukemia) ([place]) From the CLL standpoint, she is responding to treatment well without major side effects We will continue aggressive supportive care I plan to prescribe bendaka only I plan to repeat imaging after 3 cycles of treatment  Pancytopenia, acquired ([place]) Her thrombocytopenia is resolved Observe for now  CKD (chronic kidney disease), symptom management only, stage 3 (moderate) This is stable Observe only   No orders of the defined types were placed in this encounter.   All questions were answered. The patient knows to call the clinic with any problems, questions or concerns. The total time spent in the appointment was 20 minutes encounter with patients including review of chart and various tests results, discussions about plan of care and coordination of care plan   Heath Lark, MD 03/04/2020 7:27 PM  INTERVAL HISTORY: Please see below for problem oriented charting. She returns with her sister After many hours of work with her insurance, her treatment with [facility] is approved. I do not plan to start immunotherapy now, and Gazyva coverage was declined She has no new lymphadenopathy No recent infection  SUMMARY OF ONCOLOGIC HISTORY: Oncology History Overview Note  FISH del 13 q   CLL (chronic lymphocytic leukemia) ([place])  07/02/2010 Procedure   LN biopsy confirmed CLL   08/18/2010 Procedure   Bone marrow biopsy confirmed CLL   11/18/2010 - 04/15/2011 Chemotherapy   patient received 6 cycles of FLudarabine and Rituximab   01/13/2012 Relapse/Recurrence   Repeat BM biopsy confirmed relapse   01/27/2012 - 06/23/2012 Chemotherapy   Bendamustine & Rituximab given, complicated by infusion reaction to Rituximab, completed 6 cycles   06/29/2013 Imaging   Ct scan confirmed disease relapse with bulky  lymphadenopathy   07/23/2013 Procedure   Repeat BM biopsy due to thrombocytopenia and progressive leukocytosis   08/02/2013 Relapse/Recurrence   Patient consented to start iburitinib as salvage therapy for relapsed CLL   11/09/2013 Imaging   Repeat CT scan the chest, abdomen and pelvis showed greater than 50% response to treatment   06/17/2014 Imaging   Repeat CT scan showed near complete response to treatment.   05/30/2015 Imaging   Repeat CT scan showed near complete response to treatment.   06/14/2016 Imaging   CT scan showed stable scattered small retroperitoneal lymph nodes and pelvic lymph nodes. No findings for recurrent or progressive lymphoma. Stable mild splenomegaly.   12/30/2017 Imaging   1. Recurrent adenopathy within the chest, abdomen, and pelvis. 2. Splenomegaly. 3. Aortic Atherosclerosis (ICD10-I70.0). 4. Coronary artery calcifications.   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 The catheter is ready for immediate use.   01/24/2018 -  Chemotherapy   The patient had Gazyva and 1 dose of bendamustine. Bendamustine was discontinued due to inability to get insurance approval   05/08/2018 Imaging   1. Interval partial treatment response. Bilateral axillary, mediastinal, right hilar, retroperitoneal and bilateral pelvic adenopathy is all decreased. Mild splenomegaly, decreased. No new or progressive disease. 2. Aortic Atherosclerosis (ICD10-I70.0).   01/31/2020 Imaging   1. Since 12/29/2017, marked progression of adenopathy within the chest, abdomen, and pelvis as detailed above. 2. Progressive splenomegaly, consistent with splenic involvement. 3. New pulmonary parenchymal findings which could represent interval infection (including  atypical etiologies) or aspiration. 4. Coronary artery atherosclerosis. Aortic  Atherosclerosis (ICD10-I70.0).   02/07/2020 -  Chemotherapy   The patient had palonosetron (ALOXI) injection 0.25 mg, 0.25 mg, Intravenous,  Once, 2 of 6 cycles Administration: 0.25 mg (02/07/2020), 0.25 mg (03/04/2020) bendamustine (BENDEKA) 125 mg in sodium chloride 0.9 % 50 mL (2.2727 mg/mL) chemo infusion, 70 mg/m2 = 125 mg (100 % of original dose 70 mg/m2), Intravenous,  Once, 2 of 6 cycles Dose modification: 70 mg/m2 (original dose 70 mg/m2, Cycle 1, Reason: Provider Judgment) Administration: 125 mg (02/07/2020), 125 mg (02/08/2020), 125 mg (03/04/2020) obinutuzumab (GAZYVA) 1,000 mg in sodium chloride 0.9 % 250 mL (3.4483 mg/mL) chemo infusion, 1,000 mg, Intravenous, Once, 0 of 4 cycles  for chemotherapy treatment.      REVIEW OF SYSTEMS:   Constitutional: Denies fevers, chills or abnormal weight loss Eyes: Denies blurriness of vision Ears, nose, mouth, throat, and face: Denies mucositis or sore throat Respiratory: Denies cough, dyspnea or wheezes Cardiovascular: Denies palpitation, chest discomfort or lower extremity swelling Gastrointestinal:  Denies nausea, heartburn or change in bowel habits Skin: Denies abnormal skin rashes Lymphatics: Denies new lymphadenopathy or easy bruising Neurological:Denies numbness, tingling or new weaknesses Behavioral/Psych: Mood is stable, no new changes  All other systems were reviewed with the patient and are negative.  I have reviewed the past medical history, past surgical history, social history and family history with the patient and they are unchanged from previous note.  ALLERGIES:  is allergic to azithromycin and ibuprofen.  MEDICATIONS:  Current Outpatient Medications  Medication Sig Dispense Refill  . acetaminophen (TYLENOL) 325 MG tablet Take 650 mg by mouth every 6 (six) hours as needed.    Marland Kitchen acyclovir (ZOVIRAX) 400 MG tablet Take 1 tablet (400 mg total) by mouth daily. 30 tablet 5  . albuterol (VENTOLIN HFA) 108 (90 Base) MCG/ACT  inhaler Inhale 2 puffs into the lungs in the morning and at bedtime.    Marland Kitchen allopurinol (ZYLOPRIM) 300 MG tablet Take 1 tablet (300 mg total) by mouth daily. 30 tablet 0  . amLODipine (NORVASC) 10 MG tablet TAKE 1 TABLET (10 MG TOTAL) BY MOUTH DAILY. 90 tablet 1  . azelastine (ASTELIN) 0.1 % nasal spray Place 1 spray into both nostrils 2 (two) times daily. Use in each nostril as directed    . bisoprolol (ZEBETA) 5 MG tablet Take 10 mg by mouth daily.   10  . Carboxymethylcellul-Glycerin (REFRESH OPTIVE SENSITIVE OP) Place 1 drop into both eyes as needed.     . citalopram (CELEXA) 20 MG tablet Take 20 mg by mouth every morning.     . fluticasone-salmeterol (ADVAIR HFA) 115-21 MCG/ACT inhaler Inhale 2 puffs into the lungs 2 (two) times daily.    Marland Kitchen gabapentin (NEURONTIN) 300 MG capsule Take 300 mg by mouth at bedtime.    . hydrochlorothiazide (HYDRODIURIL) 25 MG tablet Take 25 mg by mouth daily.    Marland Kitchen levocetirizine (XYZAL) 5 MG tablet Take 5 mg by mouth every evening.    . lidocaine-prilocaine (EMLA) cream Apply to affected area once 30 g 5  . losartan-hydrochlorothiazide (HYZAAR) 100-25 MG tablet Take by mouth daily.   9  . Multiple Vitamins-Minerals (PRESERVISION AREDS 2) CAPS Take 1 capsule by mouth 2 (two) times daily.    . ondansetron (ZOFRAN) 8 MG tablet Take 1 tablet (8 mg total) by mouth 2 (two) times daily. Start second day after chemotherapy. Then take as needed for nausea or vomiting. 30 tablet 1  . prochlorperazine (  COMPAZINE) 10 MG tablet Take 1 tablet (10 mg total) by mouth every 6 (six) hours as needed (Nausea or vomiting). 30 tablet 1  . sulfamethoxazole-trimethoprim (BACTRIM DS) 800-160 MG tablet Take 1 tablet by mouth 3 (three) times a week. 12 tablet 5  . triamcinolone (NASACORT) 55 MCG/ACT AERO nasal inhaler Place 2 sprays into the nose daily. 1 Inhaler 12  . Vitamin D, Ergocalciferol, 2000 units CAPS Take by mouth.     No current facility-administered medications for this visit.     PHYSICAL EXAMINATION: ECOG PERFORMANCE STATUS: 1 - Symptomatic but completely ambulatory  Vitals:   03/04/20 0906  BP: (!) 124/49  Pulse: 66  Resp: 18  Temp: 98.7 F (37.1 C)  SpO2: 95%   Filed Weights   03/04/20 0906  Weight: 248 lb (112.5 kg)    GENERAL:alert, no distress and comfortable SKIN: skin color, texture, turgor are normal, no rashes or significant lesions EYES: normal, Conjunctiva are pink and non-injected, sclera clear OROPHARYNX:no exudate, no erythema and lips, buccal mucosa, and tongue normal  NECK: supple, thyroid normal size, non-tender, without nodularity LYMPH:  no palpable lymphadenopathy in the cervical, axillary or inguinal. Previously palpable lymph nodes are smaller LUNGS: clear to auscultation and percussion with normal breathing effort HEART: regular rate & rhythm and no murmurs and no lower extremity edema ABDOMEN:abdomen soft, non-tender and normal bowel sounds Musculoskeletal:no cyanosis of digits and no clubbing  NEURO: alert & oriented x 3 with fluent speech, no focal motor/sensory deficits  LABORATORY DATA:  I have reviewed the data as listed    Component Value Date/Time   NA 141 03/04/2020 0841   NA 141 08/23/2017 1026   K 4.5 03/04/2020 0841   K 3.7 08/23/2017 1026   CL 102 03/04/2020 0841   CL 98 02/02/2013 1353   CO2 27 03/04/2020 0841   CO2 33 (H) 08/23/2017 1026   GLUCOSE 99 03/04/2020 0841   GLUCOSE 92 08/23/2017 1026   GLUCOSE 99 02/02/2013 1353   BUN 21 03/04/2020 0841   BUN 21.0 08/23/2017 1026   CREATININE 1.27 (H) 03/04/2020 0841   CREATININE 1.2 (H) 08/23/2017 1026   CALCIUM 9.5 03/04/2020 0841   CALCIUM 9.3 08/23/2017 1026   PROT 6.1 (L) 03/04/2020 0841   PROT 6.3 (L) 08/23/2017 1026   ALBUMIN 3.8 03/04/2020 0841   ALBUMIN 3.8 08/23/2017 1026   AST 14 (L) 03/04/2020 0841   AST 11 08/23/2017 1026   ALT 14 03/04/2020 0841   ALT 10 08/23/2017 1026   ALKPHOS 82 03/04/2020 0841   ALKPHOS 66 08/23/2017 1026    BILITOT 0.6 03/04/2020 0841   BILITOT 0.59 08/23/2017 1026   GFRNONAA 42 (L) 03/04/2020 0841   GFRAA 48 (L) 03/04/2020 0841    No results found for: SPEP, UPEP  Lab Results  Component Value Date   WBC 18.3 (H) 03/04/2020   NEUTROABS 4.2 03/04/2020   HGB 11.5 (L) 03/04/2020   HCT 36.5 03/04/2020   MCV 90.6 03/04/2020   PLT 160 03/04/2020      Chemistry      Component Value Date/Time   NA 141 03/04/2020 0841   NA 141 08/23/2017 1026   K 4.5 03/04/2020 0841   K 3.7 08/23/2017 1026   CL 102 03/04/2020 0841   CL 98 02/02/2013 1353   CO2 27 03/04/2020 0841   CO2 33 (H) 08/23/2017 1026   BUN 21 03/04/2020 0841   BUN 21.0 08/23/2017 1026   CREATININE 1.27 (H) 03/04/2020 3875  CREATININE 1.2 (H) 08/23/2017 1026      Component Value Date/Time   CALCIUM 9.5 03/04/2020 0841   CALCIUM 9.3 08/23/2017 1026   ALKPHOS 82 03/04/2020 0841   ALKPHOS 66 08/23/2017 1026   AST 14 (L) 03/04/2020 0841   AST 11 08/23/2017 1026   ALT 14 03/04/2020 0841   ALT 10 08/23/2017 1026   BILITOT 0.6 03/04/2020 0841   BILITOT 0.59 08/23/2017 1026

## 2020-03-04 NOTE — Patient Instructions (Signed)
[facility] Discharge Instructions for Patients Receiving Chemotherapy  Today you received the following chemotherapy agents Bendeka  To help prevent nausea and vomiting after your treatment, we encourage you to take your nausea medication as directed   If you develop nausea and vomiting that is not controlled by your nausea medication, call the clinic.   BELOW ARE SYMPTOMS THAT SHOULD BE REPORTED IMMEDIATELY:  *FEVER GREATER THAN 100.5 F  *CHILLS WITH OR WITHOUT FEVER  NAUSEA AND VOMITING THAT IS NOT CONTROLLED WITH YOUR NAUSEA MEDICATION  *UNUSUAL SHORTNESS OF BREATH  *UNUSUAL BRUISING OR BLEEDING  TENDERNESS IN MOUTH AND THROAT WITH OR WITHOUT PRESENCE OF ULCERS  *URINARY PROBLEMS  *BOWEL PROBLEMS  UNUSUAL RASH Items with * indicate a potential emergency and should be followed up as soon as possible.  Feel free to call the clinic should you have any questions or concerns. The clinic phone number is [phone].  Please show the [facility] at check-in to the Emergency Department and triage nurse.    Bendamustine (Bendeka) Injection What is this medicine? BENDAMUSTINE (BEN da MUS teen) is a chemotherapy drug. It is used to treat chronic lymphocytic leukemia and non-Hodgkin lymphoma. This medicine may be used for other purposes; ask your health care provider or pharmacist if you have questions. COMMON BRAND NAME(S): Kristine Royal, Treanda What should I tell my health care provider before I take this medicine? They need to know if you have any of these conditions:  infection (especially a virus infection such as chickenpox, cold sores, or herpes)  kidney disease  liver disease  an unusual or allergic reaction to bendamustine, mannitol, other medicines, foods, dyes, or preservatives  pregnant or trying to get pregnant  breast-feeding How should I use this medicine? This medicine is for infusion into a vein. It is given by a health care  professional in a hospital or clinic setting. Talk to your pediatrician regarding the use of this medicine in children. Special care may be needed. Overdosage: If you think you have taken too much of this medicine contact a poison control center or emergency room at once. NOTE: This medicine is only for you. Do not share this medicine with others. What if I miss a dose? It is important not to miss your dose. Call your doctor or health care professional if you are unable to keep an appointment. What may interact with this medicine? Do not take this medicine with any of the following medications:  clozapine This medicine may also interact with the following medications:  atazanavir  cimetidine  ciprofloxacin  enoxacin  fluvoxamine  medicines for seizures like carbamazepine and phenobarbital  mexiletine  rifampin  tacrine  thiabendazole  zileuton This list may not describe all possible interactions. Give your health care provider a list of all the medicines, herbs, non-prescription drugs, or dietary supplements you use. Also tell them if you smoke, drink alcohol, or use illegal drugs. Some items may interact with your medicine. What should I watch for while using this medicine? This drug may make you feel generally unwell. This is not uncommon, as chemotherapy can affect healthy cells as well as cancer cells. Report any side effects. Continue your course of treatment even though you feel ill unless your doctor tells you to stop. You may need blood work done while you are taking this medicine. Call your doctor or healthcare provider for advice if you get a fever, chills or sore throat, or other symptoms of a cold or  flu. Do not treat yourself. This drug decreases your body's ability to fight infections. Try to avoid being around people who are sick. This medicine may cause serious skin reactions. They can happen weeks to months after starting the medicine. Contact your healthcare  provider right away if you notice fevers or flu-like symptoms with a rash. The rash may be red or purple and then turn into blisters or peeling of the skin. Or, you might notice a red rash with swelling of the face, lips or lymph nodes in your neck or under your arms. This medicine may increase your risk to bruise or bleed. Call your doctor or healthcare provider if you notice any unusual bleeding. Talk to your doctor about your risk of cancer. You may be more at risk for certain types of cancers if you take this medicine. Do not become pregnant while taking this medicine or for at least 6 months after stopping it. Women should inform their doctor if they wish to become pregnant or think they might be pregnant. Men should not father a child while taking this medicine and for at least 3 months after stopping it. There is a potential for serious side effects to an unborn child. Talk to your healthcare provider or pharmacist for more information. Do not breast-feed an infant while taking this medicine or for at least 1 week after stopping it. This medicine may make it more difficult to father a child. You should talk with your doctor or healthcare provider if you are concerned about your fertility. What side effects may I notice from receiving this medicine? Side effects that you should report to your doctor or health care professional as soon as possible:  allergic reactions like skin rash, itching or hives, swelling of the face, lips, or tongue  low blood counts - this medicine may decrease the number of white blood cells, red blood cells and platelets. You may be at increased risk for infections and bleeding.  rash, fever, and swollen lymph nodes  redness, blistering, peeling, or loosening of the skin, including inside the mouth  signs of infection like fever or chills, cough, sore throat, pain or difficulty passing urine  signs of decreased platelets or bleeding like bruising, pinpoint red spots  on the skin, black, tarry stools, blood in the urine  signs of decreased red blood cells like being unusually weak or tired, fainting spells, lightheadedness  signs and symptoms of kidney injury like trouble passing urine or change in the amount of urine  signs and symptoms of liver injury like dark yellow or brown urine; general ill feeling or flu-like symptoms; light-colored stools; loss of appetite; nausea; right upper belly pain; unusually weak or tired; yellowing of the eyes or skin Side effects that usually do not require medical attention (report to your doctor or health care professional if they continue or are bothersome):  constipation  decreased appetite  diarrhea  headache  mouth sores  nausea, vomiting  tiredness This list may not describe all possible side effects. Call your doctor for medical advice about side effects. You may report side effects to FDA at 1-800-FDA-1088. Where should I keep my medicine? This drug is given in a hospital or clinic and will not be stored at home. NOTE: This sheet is a summary. It may not cover all possible information. If you have questions about this medicine, talk to your doctor, pharmacist, or health care provider.  2020 Elsevier/Gold Standard (2018-12-26 10:26:46)  Cyanocobalamin, Vitamin B12 injection What is  this medicine? CYANOCOBALAMIN (sye an oh koe BAL a min) is a man made form of vitamin B12. Vitamin B12 is used in the growth of healthy blood cells, nerve cells, and proteins in the body. It also helps with the metabolism of fats and carbohydrates. This medicine is used to treat people who can not absorb vitamin B12. This medicine may be used for other purposes; ask your health care provider or pharmacist if you have questions. COMMON BRAND NAME(S): B-12 Compliance Kit, B-12 Injection Kit, Cyomin, LA-12, Nutri-Twelve, Physicians EZ Use B-12, Primabalt What should I tell my health care provider before I take this  medicine? They need to know if you have any of these conditions:  kidney disease  Leber's disease  megaloblastic anemia  an unusual or allergic reaction to cyanocobalamin, cobalt, other medicines, foods, dyes, or preservatives  pregnant or trying to get pregnant  breast-feeding How should I use this medicine? This medicine is injected into a muscle or deeply under the skin. It is usually given by a health care professional in a clinic or doctor's office. However, your doctor may teach you how to inject yourself. Follow all instructions. Talk to your pediatrician regarding the use of this medicine in children. Special care may be needed. Overdosage: If you think you have taken too much of this medicine contact a poison control center or emergency room at once. NOTE: This medicine is only for you. Do not share this medicine with others. What if I miss a dose? If you are given your dose at a clinic or doctor's office, call to reschedule your appointment. If you give your own injections and you miss a dose, take it as soon as you can. If it is almost time for your next dose, take only that dose. Do not take double or extra doses. What may interact with this medicine?  colchicine  heavy alcohol intake This list may not describe all possible interactions. Give your health care provider a list of all the medicines, herbs, non-prescription drugs, or dietary supplements you use. Also tell them if you smoke, drink alcohol, or use illegal drugs. Some items may interact with your medicine. What should I watch for while using this medicine? Visit your doctor or health care professional regularly. You may need blood work done while you are taking this medicine. You may need to follow a special diet. Talk to your doctor. Limit your alcohol intake and avoid smoking to get the best benefit. What side effects may I notice from receiving this medicine? Side effects that you should report to your doctor  or health care professional as soon as possible:  allergic reactions like skin rash, itching or hives, swelling of the face, lips, or tongue  blue tint to skin  chest tightness, pain  difficulty breathing, wheezing  dizziness  red, swollen painful area on the leg Side effects that usually do not require medical attention (report to your doctor or health care professional if they continue or are bothersome):  diarrhea  headache This list may not describe all possible side effects. Call your doctor for medical advice about side effects. You may report side effects to FDA at 1-800-FDA-1088. Where should I keep my medicine? Keep out of the reach of children. Store at room temperature between 15 and 30 degrees C (59 and 85 degrees F). Protect from light. Throw away any unused medicine after the expiration date. NOTE: This sheet is a summary. It may not cover all possible information. If you  have questions about this medicine, talk to your doctor, pharmacist, or health care provider.  2020 Elsevier/Gold Standard (2008-01-15 22:10:20)

## 2020-03-04 NOTE — Assessment & Plan Note (Signed)
This is stable ?Observe only ?

## 2020-03-04 NOTE — Assessment & Plan Note (Signed)
Her thrombocytopenia is resolved Observe for now

## 2020-03-04 NOTE — Assessment & Plan Note (Signed)
From the CLL standpoint, she is responding to treatment well without major side effects We will continue aggressive supportive care I plan to prescribe bendaka only I plan to repeat imaging after 3 cycles of treatment

## 2020-03-05 ENCOUNTER — Other Ambulatory Visit: Payer: Self-pay

## 2020-03-05 ENCOUNTER — Inpatient Hospital Stay: Payer: Medicare Other

## 2020-03-05 ENCOUNTER — Ambulatory Visit: Payer: Medicare Other

## 2020-03-05 VITALS — BP 116/54 | HR 67 | Temp 98.4°F | Resp 18

## 2020-03-05 DIAGNOSIS — C9110 Chronic lymphocytic leukemia of B-cell type not having achieved remission: Secondary | ICD-10-CM | POA: Diagnosis not present

## 2020-03-05 DIAGNOSIS — Z5111 Encounter for antineoplastic chemotherapy: Secondary | ICD-10-CM | POA: Diagnosis not present

## 2020-03-05 MED ORDER — SODIUM CHLORIDE 0.9 % IV SOLN
70.0000 mg/m2 | Freq: Once | INTRAVENOUS | Status: AC
  Administered 2020-03-05: 125 mg via INTRAVENOUS
  Filled 2020-03-05: qty 5

## 2020-03-05 MED ORDER — HEPARIN SOD (PORK) LOCK FLUSH 100 UNIT/ML IV SOLN
500.0000 [IU] | Freq: Once | INTRAVENOUS | Status: AC | PRN
  Administered 2020-03-05: 500 [IU]
  Filled 2020-03-05: qty 5

## 2020-03-05 MED ORDER — SODIUM CHLORIDE 0.9% FLUSH
10.0000 mL | INTRAVENOUS | Status: DC | PRN
  Administered 2020-03-05: 10 mL
  Filled 2020-03-05: qty 10

## 2020-03-05 MED ORDER — SODIUM CHLORIDE 0.9 % IV SOLN
Freq: Once | INTRAVENOUS | Status: AC
  Filled 2020-03-05: qty 250

## 2020-03-05 MED ORDER — SODIUM CHLORIDE 0.9 % IV SOLN
10.0000 mg | Freq: Once | INTRAVENOUS | Status: AC
  Administered 2020-03-05: 10 mg via INTRAVENOUS
  Filled 2020-03-05: qty 10

## 2020-03-05 NOTE — Patient Instructions (Signed)
[facility] Discharge Instructions for Patients Receiving Chemotherapy  Today you received the following chemotherapy agents: Bendamustine  To help prevent nausea and vomiting after your treatment, we encourage you to take your nausea medication as directed.    If you develop nausea and vomiting that is not controlled by your nausea medication, call the clinic.   BELOW ARE SYMPTOMS THAT SHOULD BE REPORTED IMMEDIATELY:  *FEVER GREATER THAN 100.5 F  *CHILLS WITH OR WITHOUT FEVER  NAUSEA AND VOMITING THAT IS NOT CONTROLLED WITH YOUR NAUSEA MEDICATION  *UNUSUAL SHORTNESS OF BREATH  *UNUSUAL BRUISING OR BLEEDING  TENDERNESS IN MOUTH AND THROAT WITH OR WITHOUT PRESENCE OF ULCERS  *URINARY PROBLEMS  *BOWEL PROBLEMS  UNUSUAL RASH Items with * indicate a potential emergency and should be followed up as soon as possible.  Feel free to call the clinic should you have any questions or concerns. The clinic phone number is [phone].  Please show the CHEMO ALERT CARD at check-in to the Emergency Department and triage nurse.   

## 2020-03-05 NOTE — Addendum Note (Signed)
Addended by: Reyne Dumas E on: 03/05/2020 10:58 AM   Modules accepted: Orders

## 2020-03-07 DIAGNOSIS — R2681 Unsteadiness on feet: Secondary | ICD-10-CM | POA: Diagnosis not present

## 2020-03-07 DIAGNOSIS — M6281 Muscle weakness (generalized): Secondary | ICD-10-CM | POA: Diagnosis not present

## 2020-03-07 DIAGNOSIS — R262 Difficulty in walking, not elsewhere classified: Secondary | ICD-10-CM | POA: Diagnosis not present

## 2020-03-10 DIAGNOSIS — R2681 Unsteadiness on feet: Secondary | ICD-10-CM | POA: Diagnosis not present

## 2020-03-10 DIAGNOSIS — R262 Difficulty in walking, not elsewhere classified: Secondary | ICD-10-CM | POA: Diagnosis not present

## 2020-03-10 DIAGNOSIS — M6281 Muscle weakness (generalized): Secondary | ICD-10-CM | POA: Diagnosis not present

## 2020-03-13 DIAGNOSIS — R2681 Unsteadiness on feet: Secondary | ICD-10-CM | POA: Diagnosis not present

## 2020-03-13 DIAGNOSIS — M6281 Muscle weakness (generalized): Secondary | ICD-10-CM | POA: Diagnosis not present

## 2020-03-13 DIAGNOSIS — R262 Difficulty in walking, not elsewhere classified: Secondary | ICD-10-CM | POA: Diagnosis not present

## 2020-03-14 DIAGNOSIS — R262 Difficulty in walking, not elsewhere classified: Secondary | ICD-10-CM | POA: Diagnosis not present

## 2020-03-14 DIAGNOSIS — R2681 Unsteadiness on feet: Secondary | ICD-10-CM | POA: Diagnosis not present

## 2020-03-14 DIAGNOSIS — M6281 Muscle weakness (generalized): Secondary | ICD-10-CM | POA: Diagnosis not present

## 2020-03-18 DIAGNOSIS — R2681 Unsteadiness on feet: Secondary | ICD-10-CM | POA: Diagnosis not present

## 2020-03-18 DIAGNOSIS — M6281 Muscle weakness (generalized): Secondary | ICD-10-CM | POA: Diagnosis not present

## 2020-03-18 DIAGNOSIS — R262 Difficulty in walking, not elsewhere classified: Secondary | ICD-10-CM | POA: Diagnosis not present

## 2020-03-19 DIAGNOSIS — R262 Difficulty in walking, not elsewhere classified: Secondary | ICD-10-CM | POA: Diagnosis not present

## 2020-03-19 DIAGNOSIS — M6281 Muscle weakness (generalized): Secondary | ICD-10-CM | POA: Diagnosis not present

## 2020-03-19 DIAGNOSIS — R2681 Unsteadiness on feet: Secondary | ICD-10-CM | POA: Diagnosis not present

## 2020-03-21 DIAGNOSIS — M6281 Muscle weakness (generalized): Secondary | ICD-10-CM | POA: Diagnosis not present

## 2020-03-21 DIAGNOSIS — R262 Difficulty in walking, not elsewhere classified: Secondary | ICD-10-CM | POA: Diagnosis not present

## 2020-03-21 DIAGNOSIS — R2681 Unsteadiness on feet: Secondary | ICD-10-CM | POA: Diagnosis not present

## 2020-03-24 DIAGNOSIS — M6281 Muscle weakness (generalized): Secondary | ICD-10-CM | POA: Diagnosis not present

## 2020-03-24 DIAGNOSIS — R2681 Unsteadiness on feet: Secondary | ICD-10-CM | POA: Diagnosis not present

## 2020-03-24 DIAGNOSIS — R262 Difficulty in walking, not elsewhere classified: Secondary | ICD-10-CM | POA: Diagnosis not present

## 2020-03-25 DIAGNOSIS — R2681 Unsteadiness on feet: Secondary | ICD-10-CM | POA: Diagnosis not present

## 2020-03-25 DIAGNOSIS — M6281 Muscle weakness (generalized): Secondary | ICD-10-CM | POA: Diagnosis not present

## 2020-03-25 DIAGNOSIS — R262 Difficulty in walking, not elsewhere classified: Secondary | ICD-10-CM | POA: Diagnosis not present

## 2020-03-27 DIAGNOSIS — R2681 Unsteadiness on feet: Secondary | ICD-10-CM | POA: Diagnosis not present

## 2020-03-27 DIAGNOSIS — M6281 Muscle weakness (generalized): Secondary | ICD-10-CM | POA: Diagnosis not present

## 2020-03-27 DIAGNOSIS — R262 Difficulty in walking, not elsewhere classified: Secondary | ICD-10-CM | POA: Diagnosis not present

## 2020-03-28 ENCOUNTER — Encounter: Payer: Self-pay | Admitting: Diagnostic Neuroimaging

## 2020-03-28 ENCOUNTER — Ambulatory Visit: Payer: Medicare Other | Admitting: Diagnostic Neuroimaging

## 2020-03-28 VITALS — BP 142/68 | HR 66 | Ht 62.0 in | Wt 252.0 lb

## 2020-03-28 DIAGNOSIS — G6289 Other specified polyneuropathies: Secondary | ICD-10-CM | POA: Diagnosis not present

## 2020-03-28 NOTE — Progress Notes (Signed)
[facility]  PATIENT: Erin Moore DOB: 01-02-1946  REFERRING CLINICIAN: Heath Lark, MD HISTORY FROM: patient and sister REASON FOR VISIT: new consult    HISTORICAL  CHIEF COMPLAINT:  Chief Complaint  Patient presents with  . New Patient (Initial Visit)    Rm 6 here for a f/u on polyneuropathy. Pt is with her sister Leah    HISTORY OF PRESENT ILLNESS:   74 year old female with CLL, here for evaluation of numbness and tingling.  Patient reports numbness and tingling in feet for many years.  She thinks this is been going on for 3 to 5 years.  She is also had gait and balance difficulties for at least 5 years.  She has moved into assisted living at [facility] around that time.  She was using a cane at first and now using a walker for past 2 to 3 years.  Patient also has had one episode of significant tremor about 6 weeks ago, in the morning when she was eating breakfast trying to hold coffee cup.  She needed help to eat breakfast that morning for several hours and then shaking resolved.  Since that time she has had some minor tremor when using her phone.  Otherwise she is doing well.  According to sister patient has history of mild cognitive impairment/mental retardation since birth.   REVIEW OF SYSTEMS: Full 14 system review of systems performed and negative with exception of: As per HPI.  ALLERGIES: Allergies  Allergen Reactions  . Azithromycin Hives    Hives and fever  . Ibuprofen Other (See Comments) and Swelling    Hives    HOME MEDICATIONS: Outpatient Medications Prior to Visit  Medication Sig Dispense Refill  . acetaminophen (TYLENOL) 325 MG tablet Take 650 mg by mouth every 6 (six) hours as needed.    Marland Kitchen acyclovir (ZOVIRAX) 400 MG tablet Take 1 tablet (400 mg total) by mouth daily. 30 tablet 5  . albuterol (VENTOLIN HFA) 108 (90 Base) MCG/ACT inhaler Inhale 2 puffs into the lungs in the morning and at bedtime.    Marland Kitchen allopurinol (ZYLOPRIM)  300 MG tablet Take 1 tablet (300 mg total) by mouth daily. 30 tablet 0  . amLODipine (NORVASC) 10 MG tablet TAKE 1 TABLET (10 MG TOTAL) BY MOUTH DAILY. 90 tablet 1  . azelastine (ASTELIN) 0.1 % nasal spray Place 1 spray into both nostrils 2 (two) times daily. Use in each nostril as directed    . bisoprolol (ZEBETA) 5 MG tablet Take 10 mg by mouth daily.   10  . Carboxymethylcellul-Glycerin (REFRESH OPTIVE SENSITIVE OP) Place 1 drop into both eyes as needed.     . citalopram (CELEXA) 20 MG tablet Take 20 mg by mouth every morning.     . fluticasone-salmeterol (ADVAIR HFA) 115-21 MCG/ACT inhaler Inhale 2 puffs into the lungs 2 (two) times daily.    Marland Kitchen gabapentin (NEURONTIN) 300 MG capsule Take 300 mg by mouth at bedtime.    . hydrochlorothiazide (HYDRODIURIL) 25 MG tablet Take 25 mg by mouth daily.    Marland Kitchen levocetirizine (XYZAL) 5 MG tablet Take 5 mg by mouth every evening.    . lidocaine-prilocaine (EMLA) cream Apply to affected area once 30 g 5  . losartan-hydrochlorothiazide (HYZAAR) 100-25 MG tablet Take by mouth daily.   9  . Multiple Vitamins-Minerals (PRESERVISION AREDS 2) CAPS Take 1 capsule by mouth 2 (two) times daily.    . ondansetron (ZOFRAN) 8 MG tablet Take 1 tablet (8 mg total) by  mouth 2 (two) times daily. Start second day after chemotherapy. Then take as needed for nausea or vomiting. 30 tablet 1  . prochlorperazine (COMPAZINE) 10 MG tablet Take 1 tablet (10 mg total) by mouth every 6 (six) hours as needed (Nausea or vomiting). 30 tablet 1  . sulfamethoxazole-trimethoprim (BACTRIM DS) 800-160 MG tablet Take 1 tablet by mouth 3 (three) times a week. 12 tablet 5  . triamcinolone (NASACORT) 55 MCG/ACT AERO nasal inhaler Place 2 sprays into the nose daily. 1 Inhaler 12  . Vitamin D, Ergocalciferol, 2000 units CAPS Take by mouth.     No facility-administered medications prior to visit.    PAST MEDICAL HISTORY: Past Medical History:  Diagnosis Date  . Chronic lymphocytic leukemia (CLL),  B-cell ([place]) 12/18/2013  . CLL (chronic lymphocytic leukemia) ([place]) FALL OF 2011  . CLL (chronic lymphocytic leukemia) ([place]) 06/02/2015  . Depression   . Fatigue 07/18/2013  . Hyperlipemia   . Hypertension   . Learning disorder   . Mental disability   . Osteoarthritis   . Pharyngitis 09/18/2013  . Upper respiratory symptom 01/2018    PAST SURGICAL HISTORY: Past Surgical History:  Procedure Laterality Date  . ABDOMINAL HYSTERECTOMY    . BREAST BIOPSY Left 2011  . BREAST REDUCTION SURGERY    . CARPAL TUNNEL RELEASE     BILATERAL  . CESAREAN SECTION    . IR FLUORO GUIDE PORT INSERTION RIGHT  01/17/2018  . IR US GUIDE VASC ACCESS RIGHT  01/17/2018  . REDUCTION MAMMAPLASTY Bilateral 1969  . REPLACEMENT TOTAL KNEE     LEFT KNEE  . TONSILLECTOMY    . TUBAL LIGATION  1975   BILATERAL    FAMILY HISTORY: Family History  Problem Relation Age of Onset  . Breast cancer Mother     SOCIAL HISTORY: Social History   Socioeconomic History  . Marital status: Divorced    Spouse name: Not on file  . Number of children: Not on file  . Years of education: Not on file  . Highest education level: Not on file  Occupational History  . Not on file  Tobacco Use  . Smoking status: Never Smoker  . Smokeless tobacco: Never Used  Substance and Sexual Activity  . Alcohol use: No  . Drug use: No  . Sexual activity: Not on file  Other Topics Concern  . Not on file  Social History Narrative  . Not on file   Social Determinants of Health   Financial Resource Strain:   . Difficulty of Paying Living Expenses:   Food Insecurity:   . Worried About Charity fundraiser in the Last Year:   . Arboriculturist in the Last Year:   Transportation Needs:   . Film/video editor (Medical):   Marland Kitchen Lack of Transportation (Non-Medical):   Physical Activity:   . Days of Exercise per Week:   . Minutes of Exercise per Session:   Stress:   . Feeling of Stress :   Social Connections:   . Frequency of  Communication with Friends and Family:   . Frequency of Social Gatherings with Friends and Family:   . Attends Religious Services:   . Active Member of Clubs or Organizations:   . Attends Archivist Meetings:   Marland Kitchen Marital Status:   Intimate Partner Violence:   . Fear of Current or Ex-Partner:   . Emotionally Abused:   Marland Kitchen Physically Abused:   . Sexually Abused:  PHYSICAL EXAM  GENERAL EXAM/CONSTITUTIONAL: Vitals:  Vitals:   03/28/20 0940  BP: (!) 142/68  Pulse: 66  Weight: 252 lb (114.3 kg)  Height: 5\' 2"  (1.575 m)     Body mass index is 46.09 kg/m. Wt Readings from Last 3 Encounters:  03/28/20 252 lb (114.3 kg)  03/04/20 248 lb (112.5 kg)  02/14/20 255 lb (115.7 kg)     Patient is in no distress; well developed, nourished and groomed; neck is supple  CARDIOVASCULAR:  Examination of carotid arteries is normal; no carotid bruits  Regular rate and rhythm, no murmurs  Examination of peripheral vascular system by observation and palpation is normal  EYES:  Ophthalmoscopic exam of optic discs and posterior segments is normal; no papilledema or hemorrhages  No exam data present  MUSCULOSKELETAL:  Gait, strength, tone, movements noted in Neurologic exam below  NEUROLOGIC: MENTAL STATUS:  No flowsheet data found.  awake, alert, oriented to person, place and time  recent and remote memory intact  normal attention and concentration  language fluent, comprehension intact, naming intact  fund of knowledge appropriate  CRANIAL NERVE:   2nd - no papilledema on fundoscopic exam  2nd, 3rd, 4th, 6th - pupils equal and reactive to light, visual fields full to confrontation, extraocular muscles intact, no nystagmus  5th - facial sensation symmetric  7th - facial strength symmetric  8th - hearing intact  9th - palate elevates symmetrically, uvula midline  11th - shoulder shrug symmetric  12th - tongue protrusion midline  MOTOR:   normal  bulk and tone, full strength in the BUE, BLE; EXCEPT DECR FOOT DORSIFLEXION BILATERALLY  MINIMAL POSTURAL TREMOR  SENSORY:   normal and symmetric to light touch, pinprick, temperature, vibration; EXCEPT DECR IN HANDS, KNEES, FEET  COORDINATION:   finger-nose-finger, fine finger movements normal  REFLEXES:   deep tendon reflexes TRACE and symmetric  GAIT/STATION:   narrow based gait; USES WALKER     DIAGNOSTIC DATA (LABS, IMAGING, TESTING) - I reviewed patient records, labs, notes, testing and imaging myself where available.  Lab Results  Component Value Date   WBC 18.3 (H) 03/04/2020   HGB 11.5 (L) 03/04/2020   HCT 36.5 03/04/2020   MCV 90.6 03/04/2020   PLT 160 03/04/2020      Component Value Date/Time   NA 141 03/04/2020 0841   NA 141 08/23/2017 1026   K 4.5 03/04/2020 0841   K 3.7 08/23/2017 1026   CL 102 03/04/2020 0841   CL 98 02/02/2013 1353   CO2 27 03/04/2020 0841   CO2 33 (H) 08/23/2017 1026   GLUCOSE 99 03/04/2020 0841   GLUCOSE 92 08/23/2017 1026   GLUCOSE 99 02/02/2013 1353   BUN 21 03/04/2020 0841   BUN 21.0 08/23/2017 1026   CREATININE 1.27 (H) 03/04/2020 0841   CREATININE 1.2 (H) 08/23/2017 1026   CALCIUM 9.5 03/04/2020 0841   CALCIUM 9.3 08/23/2017 1026   PROT 6.1 (L) 03/04/2020 0841   PROT 6.3 (L) 08/23/2017 1026   ALBUMIN 3.8 03/04/2020 0841   ALBUMIN 3.8 08/23/2017 1026   AST 14 (L) 03/04/2020 0841   AST 11 08/23/2017 1026   ALT 14 03/04/2020 0841   ALT 10 08/23/2017 1026   ALKPHOS 82 03/04/2020 0841   ALKPHOS 66 08/23/2017 1026   BILITOT 0.6 03/04/2020 0841   BILITOT 0.59 08/23/2017 1026   GFRNONAA 42 (L) 03/04/2020 0841   GFRAA 48 (L) 03/04/2020 0841   No results found for: CHOL, HDL, LDLCALC, LDLDIRECT, TRIG, CHOLHDL  Lab Results  Component Value Date   HGBA1C 5.5 02/14/2020   Lab Results  Component Value Date   DSKAJGOT15 726 02/14/2020   Lab Results  Component Value Date   TSH 5.156 (H) 02/14/2020        ASSESSMENT AND PLAN  74 y.o. year old female here with decreased sensation in hands and feet, numbness and tingling in the feet, gait and balance difficulty since 2015 or earlier.  Signs and symptoms consistent with peripheral neuropathy.  Patient has been on chemotherapy regimen including bendamustine and iburitinib, and there are some case reports of association with neuropathy, although this would be considered to be a less common reaction.  We will proceed with work-up and check additional neuropathy labs.  Dx:  1. Other polyneuropathy       PLAN:  NUMBNESS / NEUROPATHY (? Chemotherapy vs other secondary cause) - check neuropathy labs - continue walker and PT exercises - fortunately not painful; therefore hold off on gabapentin / lyrica  TREMOR - minimal postural tremor today; monitor  Orders Placed This Encounter  Procedures  . SPEP with IFE  . ANA w/Reflex  . SSA, SSB  . ESR  . CRP  . HIV  . RPR  . Vitamin B1  . Vitamin B6  . Hepatitis C antibody  . Hepatitis B core antibody, total  . Hepatitis B surface antigen  . Hepatitis B surface antibody, qualitative  . ANCA   Return for pending if symptoms worsen or fail to improve.    Penni Bombard, MD 11/21/5595, 41:63 AM Certified in Neurology, Neurophysiology and Neuroimaging  [facility] [address] [place], [place] [address] [phone]

## 2020-03-31 DIAGNOSIS — R262 Difficulty in walking, not elsewhere classified: Secondary | ICD-10-CM | POA: Diagnosis not present

## 2020-03-31 DIAGNOSIS — R2681 Unsteadiness on feet: Secondary | ICD-10-CM | POA: Diagnosis not present

## 2020-03-31 DIAGNOSIS — M6281 Muscle weakness (generalized): Secondary | ICD-10-CM | POA: Diagnosis not present

## 2020-04-01 ENCOUNTER — Telehealth: Payer: Self-pay | Admitting: Hematology and Oncology

## 2020-04-01 ENCOUNTER — Inpatient Hospital Stay: Payer: Medicare Other

## 2020-04-01 ENCOUNTER — Telehealth: Payer: Self-pay

## 2020-04-01 ENCOUNTER — Other Ambulatory Visit: Payer: Self-pay | Admitting: Hematology and Oncology

## 2020-04-01 ENCOUNTER — Inpatient Hospital Stay: Payer: Medicare Other | Attending: Hematology and Oncology

## 2020-04-01 ENCOUNTER — Inpatient Hospital Stay: Payer: Medicare Other | Admitting: Hematology and Oncology

## 2020-04-01 ENCOUNTER — Encounter: Payer: Self-pay | Admitting: Hematology and Oncology

## 2020-04-01 ENCOUNTER — Other Ambulatory Visit: Payer: Self-pay

## 2020-04-01 VITALS — BP 128/48 | HR 67 | Temp 98.5°F | Resp 18 | Ht 62.0 in | Wt 252.2 lb

## 2020-04-01 DIAGNOSIS — N183 Chronic kidney disease, stage 3 unspecified: Secondary | ICD-10-CM

## 2020-04-01 DIAGNOSIS — C9110 Chronic lymphocytic leukemia of B-cell type not having achieved remission: Secondary | ICD-10-CM

## 2020-04-01 DIAGNOSIS — D696 Thrombocytopenia, unspecified: Secondary | ICD-10-CM

## 2020-04-01 DIAGNOSIS — M6281 Muscle weakness (generalized): Secondary | ICD-10-CM | POA: Diagnosis not present

## 2020-04-01 DIAGNOSIS — R2681 Unsteadiness on feet: Secondary | ICD-10-CM | POA: Diagnosis not present

## 2020-04-01 DIAGNOSIS — Z5111 Encounter for antineoplastic chemotherapy: Secondary | ICD-10-CM | POA: Insufficient documentation

## 2020-04-01 DIAGNOSIS — R262 Difficulty in walking, not elsewhere classified: Secondary | ICD-10-CM | POA: Diagnosis not present

## 2020-04-01 LAB — CMP (CANCER CENTER ONLY)
ALT: 11 U/L (ref 0–44)
AST: 14 U/L — ABNORMAL LOW (ref 15–41)
Albumin: 4 g/dL (ref 3.5–5.0)
Alkaline Phosphatase: 82 U/L (ref 38–126)
Anion gap: 11 (ref 5–15)
BUN: 20 mg/dL (ref 8–23)
CO2: 30 mmol/L (ref 22–32)
Calcium: 9.2 mg/dL (ref 8.9–10.3)
Chloride: 103 mmol/L (ref 98–111)
Creatinine: 1.1 mg/dL — ABNORMAL HIGH (ref 0.44–1.00)
GFR, Est AFR Am: 57 mL/min — ABNORMAL LOW (ref >60)
GFR, Estimated: 49 mL/min — ABNORMAL LOW (ref >60)
Glucose, Bld: 99 mg/dL (ref 70–99)
Potassium: 4.3 mmol/L (ref 3.5–5.1)
Sodium: 144 mmol/L (ref 135–145)
Total Bilirubin: 0.6 mg/dL (ref 0.3–1.2)
Total Protein: 5.8 g/dL — ABNORMAL LOW (ref 6.5–8.1)

## 2020-04-01 LAB — CBC WITH DIFFERENTIAL (CANCER CENTER ONLY)
Abs Immature Granulocytes: 0 10*3/uL (ref 0.00–0.07)
Basophils Absolute: 0.1 10*3/uL (ref 0.0–0.1)
Basophils Relative: 1 %
Eosinophils Absolute: 0.5 10*3/uL (ref 0.0–0.5)
Eosinophils Relative: 5 %
HCT: 35.1 % — ABNORMAL LOW (ref 36.0–46.0)
Hemoglobin: 11.2 g/dL — ABNORMAL LOW (ref 12.0–15.0)
Lymphocytes Relative: 51 %
Lymphs Abs: 4.8 10*3/uL — ABNORMAL HIGH (ref 0.7–4.0)
MCH: 27.9 pg (ref 26.0–34.0)
MCHC: 31.9 g/dL (ref 30.0–36.0)
MCV: 87.5 fL (ref 80.0–100.0)
Monocytes Absolute: 0.2 10*3/uL (ref 0.1–1.0)
Monocytes Relative: 2 %
Neutro Abs: 3.9 10*3/uL (ref 1.7–7.7)
Neutrophils Relative %: 41 %
Platelet Count: 98 10*3/uL — ABNORMAL LOW (ref 150–400)
RBC: 4.01 MIL/uL (ref 3.87–5.11)
RDW: 14.9 % (ref 11.5–15.5)
WBC Count: 9.5 10*3/uL (ref 4.0–10.5)
nRBC: 0 % (ref 0.0–0.2)

## 2020-04-01 MED ORDER — SODIUM CHLORIDE 0.9 % IV SOLN
56.0000 mg/m2 | Freq: Once | INTRAVENOUS | Status: AC
  Administered 2020-04-01: 100 mg via INTRAVENOUS
  Filled 2020-04-01: qty 4

## 2020-04-01 MED ORDER — PALONOSETRON HCL INJECTION 0.25 MG/5ML
0.2500 mg | Freq: Once | INTRAVENOUS | Status: AC
  Administered 2020-04-01: 0.25 mg via INTRAVENOUS

## 2020-04-01 MED ORDER — SODIUM CHLORIDE 0.9% FLUSH
10.0000 mL | INTRAVENOUS | Status: DC | PRN
  Administered 2020-04-01: 10 mL
  Filled 2020-04-01: qty 10

## 2020-04-01 MED ORDER — CYANOCOBALAMIN 1000 MCG/ML IJ SOLN
1000.0000 ug | Freq: Once | INTRAMUSCULAR | Status: AC
  Administered 2020-04-01: 1000 ug via INTRAMUSCULAR

## 2020-04-01 MED ORDER — HEPARIN SOD (PORK) LOCK FLUSH 100 UNIT/ML IV SOLN
500.0000 [IU] | Freq: Once | INTRAVENOUS | Status: AC | PRN
  Administered 2020-04-01: 500 [IU]
  Filled 2020-04-01: qty 5

## 2020-04-01 MED ORDER — SODIUM CHLORIDE 0.9% FLUSH
10.0000 mL | Freq: Once | INTRAVENOUS | Status: AC
  Administered 2020-04-01: 10 mL
  Filled 2020-04-01: qty 10

## 2020-04-01 MED ORDER — PALONOSETRON HCL INJECTION 0.25 MG/5ML
INTRAVENOUS | Status: AC
  Filled 2020-04-01: qty 5

## 2020-04-01 MED ORDER — SODIUM CHLORIDE 0.9 % IV SOLN
10.0000 mg | Freq: Once | INTRAVENOUS | Status: AC
  Administered 2020-04-01: 10 mg via INTRAVENOUS
  Filled 2020-04-01: qty 10

## 2020-04-01 MED ORDER — CYANOCOBALAMIN 1000 MCG/ML IJ SOLN
INTRAMUSCULAR | Status: AC
  Filled 2020-04-01: qty 1

## 2020-04-01 MED ORDER — SODIUM CHLORIDE 0.9 % IV SOLN
Freq: Once | INTRAVENOUS | Status: AC
  Filled 2020-04-01: qty 250

## 2020-04-01 NOTE — Progress Notes (Signed)
Pt's platelets are 98 today - ok to proceed per Dr. Alvy Bimler.

## 2020-04-01 NOTE — Assessment & Plan Note (Signed)
She has excellent response to therapy Lymphocytosis is almost completely resolved The only treatment complication is bruising and thrombocytopenia I plan to reduce the dose of Bendeka  I plan to order CT imaging before her next visit for objective assessment of response to therapy

## 2020-04-01 NOTE — Assessment & Plan Note (Signed)
Due to high calculated dose, I plan to modify the dose of treatment today

## 2020-04-01 NOTE — Telephone Encounter (Signed)
Called and left a message per Dr. Alvy Bimler, lab results look good today. Ask her to call the office for questions.

## 2020-04-01 NOTE — Progress Notes (Signed)
[place] OFFICE PROGRESS NOTE  Patient Care Team: Jonathon Jordan, MD as PCP - General (Family Medicine) Heath Lark, MD as Referring Physician (Hematology and Oncology)  ASSESSMENT & PLAN:  CLL (chronic lymphocytic leukemia) ([place]) She has excellent response to therapy Lymphocytosis is almost completely resolved The only treatment complication is bruising and thrombocytopenia I plan to reduce the dose of Bendeka  I plan to order CT imaging before her next visit for objective assessment of response to therapy  Thrombocytopenia, unspecified ([place]) This is due to side effects of treatment She has some bruises I plan to reduce the dose of chemotherapy but will proceed without delay  Morbid obesity due to excess calories ([place]) Due to high calculated dose, I plan to modify the dose of treatment today  CKD (chronic kidney disease), symptom management only, stage 3 (moderate) Her renal function has improved Continue close observation   Orders Placed This Encounter  Procedures  . CT CHEST W CONTRAST    Standing Status:   Future    Standing Expiration Date:   04/01/2021    Order Specific Question:   If indicated for the ordered procedure, I authorize the administration of contrast media per Radiology protocol    Answer:   Yes    Order Specific Question:   Preferred imaging location?    Answer:   [facility]    Order Specific Question:   Radiology Contrast Protocol - do NOT remove file path    Answer:   \\[email or web address].[email or web address]  . CT ABDOMEN PELVIS W CONTRAST    Standing Status:   Future    Standing Expiration Date:   04/01/2021    Order Specific Question:   If indicated for the ordered procedure, I authorize the administration of contrast media per Radiology protocol    Answer:   Yes    Order Specific Question:   Preferred imaging location?    Answer:   [facility]    Order Specific Question:   Radiology Contrast Protocol - do  NOT remove file path    Answer:   \\[email or web address]\[email or web address]\CTProtocols.[email or web address]    All questions were answered. The patient knows to call the clinic with any problems, questions or concerns. The total time spent in the appointment was 30 minutes encounter with patients including review of chart and various tests results, discussions about plan of care and coordination of care plan   Heath Lark, MD 04/01/2020 1:49 PM  INTERVAL HISTORY: Please see below for problem oriented charting. She returns for cycle 3 of treatment She is doing well Denies nausea or constipation She bruises easily The patient denies any recent signs or symptoms of bleeding such as spontaneous epistaxis, hematuria or hematochezia. No recent infection, fever or chills SUMMARY OF ONCOLOGIC HISTORY: Oncology History Overview Note  FISH del 13 q   CLL (chronic lymphocytic leukemia) ([place])  07/02/2010 Procedure   LN biopsy confirmed CLL   08/18/2010 Procedure   Bone marrow biopsy confirmed CLL   11/18/2010 - 04/15/2011 Chemotherapy   patient received 6 cycles of FLudarabine and Rituximab   01/13/2012 Relapse/Recurrence   Repeat BM biopsy confirmed relapse   01/27/2012 - 06/23/2012 Chemotherapy   Bendamustine & Rituximab given, complicated by infusion reaction to Rituximab, completed 6 cycles   06/29/2013 Imaging   Ct scan confirmed disease relapse with bulky lymphadenopathy   07/23/2013 Procedure   Repeat BM biopsy due to thrombocytopenia and progressive leukocytosis   08/02/2013 Relapse/Recurrence   Patient consented to start iburitinib as salvage  therapy for relapsed CLL   11/09/2013 Imaging   Repeat CT scan the chest, abdomen and pelvis showed greater than 50% response to treatment   06/17/2014 Imaging   Repeat CT scan showed near complete response to treatment.   05/30/2015 Imaging   Repeat CT scan showed near complete response to treatment.   06/14/2016 Imaging   CT scan showed stable scattered small  retroperitoneal lymph nodes and pelvic lymph nodes. No findings for recurrent or progressive lymphoma. Stable mild splenomegaly.   12/30/2017 Imaging   1. Recurrent adenopathy within the chest, abdomen, and pelvis. 2. Splenomegaly. 3. Aortic Atherosclerosis (ICD10-I70.0). 4. Coronary artery calcifications.   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 The catheter is ready for immediate use.   01/24/2018 -  Chemotherapy   The patient had Gazyva and 1 dose of bendamustine. Bendamustine was discontinued due to inability to get insurance approval   05/08/2018 Imaging   1. Interval partial treatment response. Bilateral axillary, mediastinal, right hilar, retroperitoneal and bilateral pelvic adenopathy is all decreased. Mild splenomegaly, decreased. No new or progressive disease. 2. Aortic Atherosclerosis (ICD10-I70.0).   01/31/2020 Imaging   1. Since 12/29/2017, marked progression of adenopathy within the chest, abdomen, and pelvis as detailed above. 2. Progressive splenomegaly, consistent with splenic involvement. 3. New pulmonary parenchymal findings which could represent interval infection (including atypical etiologies) or aspiration. 4. Coronary artery atherosclerosis. Aortic Atherosclerosis (ICD10-I70.0).   02/07/2020 -  Chemotherapy   The patient had palonosetron (ALOXI) injection 0.25 mg, 0.25 mg, Intravenous,  Once, 3 of 6 cycles Administration: 0.25 mg (02/07/2020), 0.25 mg (03/04/2020) bendamustine (BENDEKA) 125 mg in sodium chloride 0.9 % 50 mL (2.2727 mg/mL) chemo infusion, 70 mg/m2 = 125 mg (100 % of original dose 70 mg/m2), Intravenous,  Once, 3 of 6 cycles Dose modification: 70 mg/m2 (original dose 70 mg/m2, Cycle 1, Reason: Provider Judgment), 56 mg/m2 (80 % of original dose 70 mg/m2, Cycle 3, Reason: Dose Not  Tolerated) Administration: 125 mg (02/07/2020), 125 mg (02/08/2020), 125 mg (03/04/2020), 125 mg (03/05/2020)  for chemotherapy treatment.      REVIEW OF SYSTEMS:   Constitutional: Denies fevers, chills or abnormal weight loss Eyes: Denies blurriness of vision Ears, nose, mouth, throat, and face: Denies mucositis or sore throat Respiratory: Denies cough, dyspnea or wheezes Cardiovascular: Denies palpitation, chest discomfort or lower extremity swelling Gastrointestinal:  Denies nausea, heartburn or change in bowel habits Skin: Denies abnormal skin rashes Lymphatics: Denies new lymphadenopathy  Neurological:Denies numbness, tingling or new weaknesses Behavioral/Psych: Mood is stable, no new changes  All other systems were reviewed with the patient and are negative.  I have reviewed the past medical history, past surgical history, social history and family history with the patient and they are unchanged from previous note.  ALLERGIES:  is allergic to azithromycin and ibuprofen.  MEDICATIONS:  Current Outpatient Medications  Medication Sig Dispense Refill  . acetaminophen (TYLENOL) 325 MG tablet Take 650 mg by mouth every 6 (six) hours as needed.    Marland Kitchen acyclovir (ZOVIRAX) 400 MG tablet Take 1 tablet (400 mg total) by mouth daily. 30 tablet 5  . albuterol (VENTOLIN HFA) 108 (90 Base) MCG/ACT inhaler Inhale 2 puffs into the lungs in the morning and at bedtime.    Marland Kitchen allopurinol (ZYLOPRIM) 300 MG tablet Take 1 tablet (300 mg total) by mouth daily. 30 tablet 0  .  amLODipine (NORVASC) 10 MG tablet TAKE 1 TABLET (10 MG TOTAL) BY MOUTH DAILY. 90 tablet 1  . azelastine (ASTELIN) 0.1 % nasal spray Place 1 spray into both nostrils 2 (two) times daily. Use in each nostril as directed    . bisoprolol (ZEBETA) 5 MG tablet Take 10 mg by mouth daily.   10  . Carboxymethylcellul-Glycerin (REFRESH OPTIVE SENSITIVE OP) Place 1 drop into both eyes as needed.     . citalopram (CELEXA) 20 MG tablet Take 20 mg by  mouth every morning.     . fluticasone-salmeterol (ADVAIR HFA) 115-21 MCG/ACT inhaler Inhale 2 puffs into the lungs 2 (two) times daily.    Marland Kitchen gabapentin (NEURONTIN) 300 MG capsule Take 300 mg by mouth at bedtime.    . hydrochlorothiazide (HYDRODIURIL) 25 MG tablet Take 25 mg by mouth daily.    Marland Kitchen levocetirizine (XYZAL) 5 MG tablet Take 5 mg by mouth every evening.    . lidocaine-prilocaine (EMLA) cream Apply to affected area once 30 g 5  . losartan-hydrochlorothiazide (HYZAAR) 100-25 MG tablet Take by mouth daily.   9  . Multiple Vitamins-Minerals (PRESERVISION AREDS 2) CAPS Take 1 capsule by mouth 2 (two) times daily.    . ondansetron (ZOFRAN) 8 MG tablet Take 1 tablet (8 mg total) by mouth 2 (two) times daily. Start second day after chemotherapy. Then take as needed for nausea or vomiting. 30 tablet 1  . prochlorperazine (COMPAZINE) 10 MG tablet Take 1 tablet (10 mg total) by mouth every 6 (six) hours as needed (Nausea or vomiting). 30 tablet 1  . sulfamethoxazole-trimethoprim (BACTRIM DS) 800-160 MG tablet Take 1 tablet by mouth 3 (three) times a week. 12 tablet 5  . triamcinolone (NASACORT) 55 MCG/ACT AERO nasal inhaler Place 2 sprays into the nose daily. 1 Inhaler 12  . Vitamin D, Ergocalciferol, 2000 units CAPS Take by mouth.     No current facility-administered medications for this visit.   Facility-Administered Medications Ordered in Other Visits  Medication Dose Route Frequency Provider Last Rate Last Admin  . bendamustine (BENDEKA) 100 mg in sodium chloride 0.9 % 50 mL (1.8519 mg/mL) chemo infusion  56 mg/m2 (Treatment Plan Adjusted) Intravenous Once Alvy Bimler, Jennefer Kopp, MD      . cyanocobalamin ((VITAMIN B-12)) injection 1,000 mcg  1,000 mcg Intramuscular Once Alvy Bimler, Shaune Malacara, MD      . dexamethasone (DECADRON) 10 mg in sodium chloride 0.9 % 50 mL IVPB  10 mg Intravenous Once Alvy Bimler, Ranisha Allaire, MD 204 mL/hr at 04/01/20 1346 10 mg at 04/01/20 1346  . heparin lock flush 100 unit/mL  500 Units  Intracatheter Once PRN Alvy Bimler, Tauriel Scronce, MD      . sodium chloride flush (NS) 0.9 % injection 10 mL  10 mL Intracatheter PRN Alvy Bimler, Aristides Luckey, MD        PHYSICAL EXAMINATION: ECOG PERFORMANCE STATUS: 1 - Symptomatic but completely ambulatory  Vitals:   04/01/20 1253  BP: (!) 128/48  Pulse: 67  Resp: 18  Temp: 98.5 F (36.9 C)  SpO2: 97%   Filed Weights   04/01/20 1253  Weight: 252 lb 3.2 oz (114.4 kg)    GENERAL:alert, no distress and comfortable SKIN: skin color, texture, turgor are normal, no rashes or significant lesions.  Noted some skin bruising EYES: normal, Conjunctiva are pink and non-injected, sclera clear OROPHARYNX:no exudate, no erythema and lips, buccal mucosa, and tongue normal  NECK: supple, thyroid normal size, non-tender, without nodularity LYMPH:  no palpable lymphadenopathy in the cervical, axillary or inguinal LUNGS: clear  to auscultation and percussion with normal breathing effort HEART: regular rate & rhythm and no murmurs and no lower extremity edema ABDOMEN:abdomen soft, non-tender and normal bowel sounds Musculoskeletal:no cyanosis of digits and no clubbing  NEURO: alert & oriented x 3 with fluent speech, no focal motor/sensory deficits  LABORATORY DATA:  I have reviewed the data as listed    Component Value Date/Time   NA 144 04/01/2020 1222   NA 141 08/23/2017 1026   K 4.3 04/01/2020 1222   K 3.7 08/23/2017 1026   CL 103 04/01/2020 1222   CL 98 02/02/2013 1353   CO2 30 04/01/2020 1222   CO2 33 (H) 08/23/2017 1026   GLUCOSE 99 04/01/2020 1222   GLUCOSE 92 08/23/2017 1026   GLUCOSE 99 02/02/2013 1353   BUN 20 04/01/2020 1222   BUN 21.0 08/23/2017 1026   CREATININE 1.10 (H) 04/01/2020 1222   CREATININE 1.2 (H) 08/23/2017 1026   CALCIUM 9.2 04/01/2020 1222   CALCIUM 9.3 08/23/2017 1026   PROT 5.8 (L) 04/01/2020 1222   PROT WILL FOLLOW 03/28/2020 1044   PROT 6.3 (L) 08/23/2017 1026   ALBUMIN 4.0 04/01/2020 1222   ALBUMIN 3.8 08/23/2017 1026   AST  14 (L) 04/01/2020 1222   AST 11 08/23/2017 1026   ALT 11 04/01/2020 1222   ALT 10 08/23/2017 1026   ALKPHOS 82 04/01/2020 1222   ALKPHOS 66 08/23/2017 1026   BILITOT 0.6 04/01/2020 1222   BILITOT 0.59 08/23/2017 1026   GFRNONAA 49 (L) 04/01/2020 1222   GFRAA 57 (L) 04/01/2020 1222    No results found for: SPEP, UPEP  Lab Results  Component Value Date   WBC 9.5 04/01/2020   NEUTROABS 3.9 04/01/2020   HGB 11.2 (L) 04/01/2020   HCT 35.1 (L) 04/01/2020   MCV 87.5 04/01/2020   PLT 98 (L) 04/01/2020      Chemistry      Component Value Date/Time   NA 144 04/01/2020 1222   NA 141 08/23/2017 1026   K 4.3 04/01/2020 1222   K 3.7 08/23/2017 1026   CL 103 04/01/2020 1222   CL 98 02/02/2013 1353   CO2 30 04/01/2020 1222   CO2 33 (H) 08/23/2017 1026   BUN 20 04/01/2020 1222   BUN 21.0 08/23/2017 1026   CREATININE 1.10 (H) 04/01/2020 1222   CREATININE 1.2 (H) 08/23/2017 1026      Component Value Date/Time   CALCIUM 9.2 04/01/2020 1222   CALCIUM 9.3 08/23/2017 1026   ALKPHOS 82 04/01/2020 1222   ALKPHOS 66 08/23/2017 1026   AST 14 (L) 04/01/2020 1222   AST 11 08/23/2017 1026   ALT 11 04/01/2020 1222   ALT 10 08/23/2017 1026   BILITOT 0.6 04/01/2020 1222   BILITOT 0.59 08/23/2017 1026

## 2020-04-01 NOTE — Patient Instructions (Signed)
[facility] Discharge Instructions for Patients Receiving Chemotherapy  Today you received the following chemotherapy agents: Bendamustine  To help prevent nausea and vomiting after your treatment, we encourage you to take your nausea medication as directed.    If you develop nausea and vomiting that is not controlled by your nausea medication, call the clinic.   BELOW ARE SYMPTOMS THAT SHOULD BE REPORTED IMMEDIATELY:  *FEVER GREATER THAN 100.5 F  *CHILLS WITH OR WITHOUT FEVER  NAUSEA AND VOMITING THAT IS NOT CONTROLLED WITH YOUR NAUSEA MEDICATION  *UNUSUAL SHORTNESS OF BREATH  *UNUSUAL BRUISING OR BLEEDING  TENDERNESS IN MOUTH AND THROAT WITH OR WITHOUT PRESENCE OF ULCERS  *URINARY PROBLEMS  *BOWEL PROBLEMS  UNUSUAL RASH Items with * indicate a potential emergency and should be followed up as soon as possible.  Feel free to call the clinic should you have any questions or concerns. The clinic phone number is [phone].  Please show the CHEMO ALERT CARD at check-in to the Emergency Department and triage nurse.   

## 2020-04-01 NOTE — Patient Instructions (Signed)

## 2020-04-01 NOTE — Assessment & Plan Note (Signed)
This is due to side effects of treatment She has some bruises I plan to reduce the dose of chemotherapy but will proceed without delay

## 2020-04-01 NOTE — Assessment & Plan Note (Signed)
Her renal function has improved Continue close observation

## 2020-04-01 NOTE — Telephone Encounter (Signed)
Scheduled per 6/15 sch message. Noted to give pt updated calendar.

## 2020-04-02 ENCOUNTER — Inpatient Hospital Stay: Payer: Medicare Other

## 2020-04-02 ENCOUNTER — Other Ambulatory Visit: Payer: Self-pay

## 2020-04-02 VITALS — BP 124/58 | HR 72 | Temp 97.7°F | Resp 18

## 2020-04-02 DIAGNOSIS — C9110 Chronic lymphocytic leukemia of B-cell type not having achieved remission: Secondary | ICD-10-CM | POA: Diagnosis not present

## 2020-04-02 DIAGNOSIS — D696 Thrombocytopenia, unspecified: Secondary | ICD-10-CM | POA: Diagnosis not present

## 2020-04-02 DIAGNOSIS — Z5111 Encounter for antineoplastic chemotherapy: Secondary | ICD-10-CM | POA: Diagnosis not present

## 2020-04-02 DIAGNOSIS — N183 Chronic kidney disease, stage 3 unspecified: Secondary | ICD-10-CM | POA: Diagnosis not present

## 2020-04-02 LAB — HEPATITIS B SURFACE ANTIGEN: Hepatitis B Surface Ag: NEGATIVE

## 2020-04-02 LAB — MULTIPLE MYELOMA PANEL, SERUM
Albumin SerPl Elph-Mcnc: 3.7 g/dL (ref 2.9–4.4)
Albumin/Glob SerPl: 1.7 (ref 0.7–1.7)
Alpha 1: 0.4 g/dL (ref 0.0–0.4)
Alpha2 Glob SerPl Elph-Mcnc: 0.8 g/dL (ref 0.4–1.0)
B-Globulin SerPl Elph-Mcnc: 0.9 g/dL (ref 0.7–1.3)
Gamma Glob SerPl Elph-Mcnc: 0.2 g/dL — ABNORMAL LOW (ref 0.4–1.8)
Globulin, Total: 2.2 g/dL (ref 2.2–3.9)
IgA/Immunoglobulin A, Serum: 8 mg/dL — ABNORMAL LOW (ref 64–422)
IgG (Immunoglobin G), Serum: 94 mg/dL — ABNORMAL LOW (ref 586–1602)
IgM (Immunoglobulin M), Srm: <5 mg/dL — ABNORMAL LOW (ref 26–217)
Total Protein: 5.9 g/dL — ABNORMAL LOW (ref 6.0–8.5)

## 2020-04-02 LAB — SJOGREN'S SYNDROME ANTIBODS(SSA + SSB)
ENA SSA (RO) Ab: <0.2 AI (ref 0.0–0.9)
ENA SSB (LA) Ab: <0.2 AI (ref 0.0–0.9)

## 2020-04-02 LAB — PAN-ANCA
ANCA Proteinase 3: <3.5 U/mL (ref 0.0–3.5)
Atypical pANCA: <1:20 {titer}
C-ANCA: <1:20 {titer}
Myeloperoxidase Ab: <9 U/mL (ref 0.0–9.0)
P-ANCA: <1:20 {titer}

## 2020-04-02 LAB — HEPATITIS B SURFACE ANTIBODY,QUALITATIVE: Hep B Surface Ab, Qual: NONREACTIVE

## 2020-04-02 LAB — SEDIMENTATION RATE: Sed Rate: 5 mm/hr (ref 0–40)

## 2020-04-02 LAB — HEPATITIS B CORE ANTIBODY, TOTAL: Hep B Core Total Ab: NEGATIVE

## 2020-04-02 LAB — HEPATITIS C ANTIBODY: Hep C Virus Ab: <0.1 s/co ratio (ref 0.0–0.9)

## 2020-04-02 LAB — HIV ANTIBODY (ROUTINE TESTING W REFLEX): HIV Screen 4th Generation wRfx: NONREACTIVE

## 2020-04-02 LAB — C-REACTIVE PROTEIN: CRP: 14 mg/L — ABNORMAL HIGH (ref 0–10)

## 2020-04-02 LAB — RPR: RPR Ser Ql: NONREACTIVE

## 2020-04-02 LAB — VITAMIN B6: Vitamin B6: 4.1 ug/L (ref 2.0–32.8)

## 2020-04-02 LAB — VITAMIN B1: Thiamine: 121.2 nmol/L (ref 66.5–200.0)

## 2020-04-02 LAB — ANA W/REFLEX: ANA Titer 1: NEGATIVE

## 2020-04-02 MED ORDER — HEPARIN SOD (PORK) LOCK FLUSH 100 UNIT/ML IV SOLN
500.0000 [IU] | Freq: Once | INTRAVENOUS | Status: AC | PRN
  Administered 2020-04-02: 500 [IU]
  Filled 2020-04-02: qty 5

## 2020-04-02 MED ORDER — SODIUM CHLORIDE 0.9% FLUSH
10.0000 mL | INTRAVENOUS | Status: DC | PRN
  Administered 2020-04-02: 10 mL
  Filled 2020-04-02: qty 10

## 2020-04-02 MED ORDER — SODIUM CHLORIDE 0.9 % IV SOLN
56.0000 mg/m2 | Freq: Once | INTRAVENOUS | Status: AC
  Administered 2020-04-02: 100 mg via INTRAVENOUS
  Filled 2020-04-02: qty 4

## 2020-04-02 MED ORDER — SODIUM CHLORIDE 0.9 % IV SOLN
10.0000 mg | Freq: Once | INTRAVENOUS | Status: AC
  Administered 2020-04-02: 10 mg via INTRAVENOUS
  Filled 2020-04-02: qty 10

## 2020-04-02 MED ORDER — SODIUM CHLORIDE 0.9 % IV SOLN
Freq: Once | INTRAVENOUS | Status: AC
  Filled 2020-04-02: qty 250

## 2020-04-02 NOTE — Patient Instructions (Signed)
[facility] Discharge Instructions for Patients Receiving Chemotherapy  Today you received the following chemotherapy agents: Bendamustine  To help prevent nausea and vomiting after your treatment, we encourage you to take your nausea medication as directed.    If you develop nausea and vomiting that is not controlled by your nausea medication, call the clinic.   BELOW ARE SYMPTOMS THAT SHOULD BE REPORTED IMMEDIATELY:  *FEVER GREATER THAN 100.5 F  *CHILLS WITH OR WITHOUT FEVER  NAUSEA AND VOMITING THAT IS NOT CONTROLLED WITH YOUR NAUSEA MEDICATION  *UNUSUAL SHORTNESS OF BREATH  *UNUSUAL BRUISING OR BLEEDING  TENDERNESS IN MOUTH AND THROAT WITH OR WITHOUT PRESENCE OF ULCERS  *URINARY PROBLEMS  *BOWEL PROBLEMS  UNUSUAL RASH Items with * indicate a potential emergency and should be followed up as soon as possible.  Feel free to call the clinic should you have any questions or concerns. The clinic phone number is [phone].  Please show the CHEMO ALERT CARD at check-in to the Emergency Department and triage nurse.   

## 2020-04-03 DIAGNOSIS — M6281 Muscle weakness (generalized): Secondary | ICD-10-CM | POA: Diagnosis not present

## 2020-04-03 DIAGNOSIS — R2681 Unsteadiness on feet: Secondary | ICD-10-CM | POA: Diagnosis not present

## 2020-04-03 DIAGNOSIS — R262 Difficulty in walking, not elsewhere classified: Secondary | ICD-10-CM | POA: Diagnosis not present

## 2020-04-07 DIAGNOSIS — M6281 Muscle weakness (generalized): Secondary | ICD-10-CM | POA: Diagnosis not present

## 2020-04-07 DIAGNOSIS — R262 Difficulty in walking, not elsewhere classified: Secondary | ICD-10-CM | POA: Diagnosis not present

## 2020-04-07 DIAGNOSIS — R2681 Unsteadiness on feet: Secondary | ICD-10-CM | POA: Diagnosis not present

## 2020-04-08 DIAGNOSIS — M6281 Muscle weakness (generalized): Secondary | ICD-10-CM | POA: Diagnosis not present

## 2020-04-08 DIAGNOSIS — R2681 Unsteadiness on feet: Secondary | ICD-10-CM | POA: Diagnosis not present

## 2020-04-08 DIAGNOSIS — R262 Difficulty in walking, not elsewhere classified: Secondary | ICD-10-CM | POA: Diagnosis not present

## 2020-04-09 ENCOUNTER — Telehealth: Payer: Self-pay | Admitting: Diagnostic Neuroimaging

## 2020-04-09 NOTE — Telephone Encounter (Signed)
Patient's sister and LG, Tawana Scale, called and is asking if patient's lab results are back from 6/11 appointment. I told her that I would ask RN to look into it and someone would be reaching back out to her. [phone].

## 2020-04-10 DIAGNOSIS — M6281 Muscle weakness (generalized): Secondary | ICD-10-CM | POA: Diagnosis not present

## 2020-04-10 DIAGNOSIS — R262 Difficulty in walking, not elsewhere classified: Secondary | ICD-10-CM | POA: Diagnosis not present

## 2020-04-10 DIAGNOSIS — R2681 Unsteadiness on feet: Secondary | ICD-10-CM | POA: Diagnosis not present

## 2020-04-14 DIAGNOSIS — R262 Difficulty in walking, not elsewhere classified: Secondary | ICD-10-CM | POA: Diagnosis not present

## 2020-04-14 DIAGNOSIS — R2681 Unsteadiness on feet: Secondary | ICD-10-CM | POA: Diagnosis not present

## 2020-04-14 DIAGNOSIS — M6281 Muscle weakness (generalized): Secondary | ICD-10-CM | POA: Diagnosis not present

## 2020-04-15 DIAGNOSIS — R262 Difficulty in walking, not elsewhere classified: Secondary | ICD-10-CM | POA: Diagnosis not present

## 2020-04-15 DIAGNOSIS — M6281 Muscle weakness (generalized): Secondary | ICD-10-CM | POA: Diagnosis not present

## 2020-04-15 DIAGNOSIS — R2681 Unsteadiness on feet: Secondary | ICD-10-CM | POA: Diagnosis not present

## 2020-04-15 NOTE — Telephone Encounter (Signed)
LVM informing patient her lab results are unremarkable. Left # for questions.

## 2020-04-15 NOTE — Telephone Encounter (Signed)
Received call back for sister, [name] and answered her questions to her stated satisfaction. Advised she call back for her sister's worsening symptoms. She verbalized understanding, appreciation.

## 2020-04-15 NOTE — Telephone Encounter (Signed)
Pt's sister Tawana Scale calls office concerned about lab results and recommendations. I do not see a DPR on file with this family member.  Pt's sister is concerned that the "unremarkable" blood results does not answer any of their questions about neuropathy.  Pt's sister also states that she signed a DPR for this office. ( I still don't see one on file.)

## 2020-04-15 NOTE — Telephone Encounter (Addendum)
LVM requesting call back.

## 2020-04-17 DIAGNOSIS — R2681 Unsteadiness on feet: Secondary | ICD-10-CM | POA: Diagnosis not present

## 2020-04-17 DIAGNOSIS — R262 Difficulty in walking, not elsewhere classified: Secondary | ICD-10-CM | POA: Diagnosis not present

## 2020-04-17 DIAGNOSIS — M6281 Muscle weakness (generalized): Secondary | ICD-10-CM | POA: Diagnosis not present

## 2020-04-22 DIAGNOSIS — R2681 Unsteadiness on feet: Secondary | ICD-10-CM | POA: Diagnosis not present

## 2020-04-22 DIAGNOSIS — R262 Difficulty in walking, not elsewhere classified: Secondary | ICD-10-CM | POA: Diagnosis not present

## 2020-04-22 DIAGNOSIS — M6281 Muscle weakness (generalized): Secondary | ICD-10-CM | POA: Diagnosis not present

## 2020-04-24 DIAGNOSIS — R2681 Unsteadiness on feet: Secondary | ICD-10-CM | POA: Diagnosis not present

## 2020-04-24 DIAGNOSIS — R262 Difficulty in walking, not elsewhere classified: Secondary | ICD-10-CM | POA: Diagnosis not present

## 2020-04-24 DIAGNOSIS — M6281 Muscle weakness (generalized): Secondary | ICD-10-CM | POA: Diagnosis not present

## 2020-04-25 DIAGNOSIS — R2681 Unsteadiness on feet: Secondary | ICD-10-CM | POA: Diagnosis not present

## 2020-04-25 DIAGNOSIS — M6281 Muscle weakness (generalized): Secondary | ICD-10-CM | POA: Diagnosis not present

## 2020-04-25 DIAGNOSIS — R262 Difficulty in walking, not elsewhere classified: Secondary | ICD-10-CM | POA: Diagnosis not present

## 2020-04-28 ENCOUNTER — Other Ambulatory Visit: Payer: Self-pay

## 2020-04-28 ENCOUNTER — Ambulatory Visit (HOSPITAL_COMMUNITY)
Admission: RE | Admit: 2020-04-28 | Discharge: 2020-04-28 | Disposition: A | Discharge status: Home / Self Care | Payer: Medicare Other | Source: Ambulatory Visit | Attending: Hematology and Oncology | Admitting: Hematology and Oncology

## 2020-04-28 ENCOUNTER — Encounter (HOSPITAL_COMMUNITY): Payer: Self-pay

## 2020-04-28 DIAGNOSIS — C9110 Chronic lymphocytic leukemia of B-cell type not having achieved remission: Secondary | ICD-10-CM | POA: Diagnosis not present

## 2020-04-28 DIAGNOSIS — I700 Atherosclerosis of aorta: Secondary | ICD-10-CM | POA: Diagnosis not present

## 2020-04-28 DIAGNOSIS — K449 Diaphragmatic hernia without obstruction or gangrene: Secondary | ICD-10-CM | POA: Diagnosis not present

## 2020-04-28 DIAGNOSIS — D1809 Hemangioma of other sites: Secondary | ICD-10-CM | POA: Diagnosis not present

## 2020-04-28 MED ORDER — IOHEXOL 300 MG/ML  SOLN
100.0000 mL | Freq: Once | INTRAMUSCULAR | Status: AC | PRN
  Administered 2020-04-28: 100 mL via INTRAVENOUS

## 2020-04-28 MED ORDER — SODIUM CHLORIDE (PF) 0.9 % IJ SOLN
INTRAMUSCULAR | Status: AC
  Filled 2020-04-28: qty 50

## 2020-04-29 ENCOUNTER — Inpatient Hospital Stay: Payer: Medicare Other | Attending: Hematology and Oncology | Admitting: Hematology and Oncology

## 2020-04-29 ENCOUNTER — Encounter: Payer: Self-pay | Admitting: Hematology and Oncology

## 2020-04-29 ENCOUNTER — Ambulatory Visit: Payer: Medicare Other

## 2020-04-29 ENCOUNTER — Inpatient Hospital Stay: Payer: Medicare Other

## 2020-04-29 ENCOUNTER — Telehealth: Payer: Self-pay | Admitting: Hematology and Oncology

## 2020-04-29 ENCOUNTER — Telehealth: Payer: Self-pay

## 2020-04-29 ENCOUNTER — Other Ambulatory Visit: Payer: Self-pay

## 2020-04-29 VITALS — BP 129/42 | HR 69 | Temp 98.2°F | Resp 18 | Ht 62.0 in | Wt 252.2 lb

## 2020-04-29 DIAGNOSIS — E538 Deficiency of other specified B group vitamins: Secondary | ICD-10-CM | POA: Insufficient documentation

## 2020-04-29 DIAGNOSIS — C9110 Chronic lymphocytic leukemia of B-cell type not having achieved remission: Secondary | ICD-10-CM | POA: Diagnosis not present

## 2020-04-29 DIAGNOSIS — J189 Pneumonia, unspecified organism: Secondary | ICD-10-CM

## 2020-04-29 DIAGNOSIS — Z95828 Presence of other vascular implants and grafts: Secondary | ICD-10-CM

## 2020-04-29 DIAGNOSIS — M6281 Muscle weakness (generalized): Secondary | ICD-10-CM | POA: Diagnosis not present

## 2020-04-29 DIAGNOSIS — Z5111 Encounter for antineoplastic chemotherapy: Secondary | ICD-10-CM | POA: Insufficient documentation

## 2020-04-29 DIAGNOSIS — D61818 Other pancytopenia: Secondary | ICD-10-CM

## 2020-04-29 DIAGNOSIS — R2681 Unsteadiness on feet: Secondary | ICD-10-CM | POA: Diagnosis not present

## 2020-04-29 DIAGNOSIS — R262 Difficulty in walking, not elsewhere classified: Secondary | ICD-10-CM | POA: Diagnosis not present

## 2020-04-29 LAB — CMP (CANCER CENTER ONLY)
ALT: 12 U/L (ref 0–44)
AST: 12 U/L — ABNORMAL LOW (ref 15–41)
Albumin: 3.9 g/dL (ref 3.5–5.0)
Alkaline Phosphatase: 83 U/L (ref 38–126)
Anion gap: 11 (ref 5–15)
BUN: 19 mg/dL (ref 8–23)
CO2: 28 mmol/L (ref 22–32)
Calcium: 9.4 mg/dL (ref 8.9–10.3)
Chloride: 102 mmol/L (ref 98–111)
Creatinine: 1.13 mg/dL — ABNORMAL HIGH (ref 0.44–1.00)
GFR, Est AFR Am: 55 mL/min — ABNORMAL LOW (ref >60)
GFR, Estimated: 48 mL/min — ABNORMAL LOW (ref >60)
Glucose, Bld: 106 mg/dL — ABNORMAL HIGH (ref 70–99)
Potassium: 4.1 mmol/L (ref 3.5–5.1)
Sodium: 141 mmol/L (ref 135–145)
Total Bilirubin: 0.6 mg/dL (ref 0.3–1.2)
Total Protein: 5.9 g/dL — ABNORMAL LOW (ref 6.5–8.1)

## 2020-04-29 LAB — CBC WITH DIFFERENTIAL (CANCER CENTER ONLY)
Abs Immature Granulocytes: 0 10*3/uL (ref 0.00–0.07)
Basophils Absolute: 0 10*3/uL (ref 0.0–0.1)
Basophils Relative: 0 %
Eosinophils Absolute: 0.1 10*3/uL (ref 0.0–0.5)
Eosinophils Relative: 1 %
HCT: 35.6 % — ABNORMAL LOW (ref 36.0–46.0)
Hemoglobin: 11.6 g/dL — ABNORMAL LOW (ref 12.0–15.0)
Lymphocytes Relative: 55 %
Lymphs Abs: 7.2 10*3/uL — ABNORMAL HIGH (ref 0.7–4.0)
MCH: 28.4 pg (ref 26.0–34.0)
MCHC: 32.6 g/dL (ref 30.0–36.0)
MCV: 87.3 fL (ref 80.0–100.0)
Monocytes Absolute: 1 10*3/uL (ref 0.1–1.0)
Monocytes Relative: 8 %
Neutro Abs: 4.7 10*3/uL (ref 1.7–7.7)
Neutrophils Relative %: 36 %
Platelet Count: 87 10*3/uL — ABNORMAL LOW (ref 150–400)
RBC: 4.08 MIL/uL (ref 3.87–5.11)
RDW: 14.6 % (ref 11.5–15.5)
WBC Count: 13 10*3/uL — ABNORMAL HIGH (ref 4.0–10.5)
nRBC: 0 % (ref 0.0–0.2)

## 2020-04-29 MED ORDER — CYANOCOBALAMIN 1000 MCG/ML IJ SOLN
INTRAMUSCULAR | Status: AC
  Filled 2020-04-29: qty 1

## 2020-04-29 MED ORDER — SODIUM CHLORIDE 0.9% FLUSH
10.0000 mL | Freq: Once | INTRAVENOUS | Status: AC
  Administered 2020-04-29: 10 mL
  Filled 2020-04-29: qty 10

## 2020-04-29 MED ORDER — AMOXICILLIN-POT CLAVULANATE 875-125 MG PO TABS
1.0000 | ORAL_TABLET | Freq: Two times a day (BID) | ORAL | 0 refills | Status: DC
Start: 2020-04-29 — End: 2020-08-12

## 2020-04-29 MED ORDER — HEPARIN SOD (PORK) LOCK FLUSH 100 UNIT/ML IV SOLN
500.0000 [IU] | Freq: Once | INTRAVENOUS | Status: AC
  Administered 2020-04-29: 500 [IU] via INTRAVENOUS
  Filled 2020-04-29: qty 5

## 2020-04-29 MED ORDER — PALONOSETRON HCL INJECTION 0.25 MG/5ML
INTRAVENOUS | Status: AC
  Filled 2020-04-29: qty 5

## 2020-04-29 MED ORDER — PREDNISONE 20 MG PO TABS
60.0000 mg | ORAL_TABLET | Freq: Every day | ORAL | 0 refills | Status: DC
Start: 2020-04-29 — End: 2020-08-12

## 2020-04-29 MED ORDER — SODIUM CHLORIDE 0.9% FLUSH
10.0000 mL | INTRAVENOUS | Status: DC | PRN
  Administered 2020-04-29: 10 mL via INTRAVENOUS
  Filled 2020-04-29: qty 10

## 2020-04-29 NOTE — Assessment & Plan Note (Signed)
She has chronic cough for many months CT scan from yesterday showed signs of left upper lobe pneumonia She feels fine and has been afebrile I recommend delaying chemotherapy for 1 week I will give her a course of prednisone along with Augmentin

## 2020-04-29 NOTE — Patient Instructions (Signed)

## 2020-04-29 NOTE — Assessment & Plan Note (Signed)
She has multifactorial pancytopenia, likely secondary to side effects of chemotherapy as well as B12 deficiency I will delay treatment and reschedule to next week

## 2020-04-29 NOTE — Telephone Encounter (Signed)
Per Dr. Alvy Bimler TC to infusion to cancel infusion appointments for 7/13 and 7/14  [facility] nurse  Informed of cancellation.

## 2020-04-29 NOTE — Assessment & Plan Note (Signed)
I have reviewed multiple imaging studies with the patient and her sister She has positive response to treatment However, due to signs of pneumonia and pancytopenia, we will delay treatment today and reschedule to next week The plan would be to proceed with several more cycles of treatment before repeating imaging study

## 2020-04-29 NOTE — Progress Notes (Signed)
[place] OFFICE PROGRESS NOTE  Patient Care Team: Jonathon Jordan, MD as PCP - General (Family Medicine) Heath Lark, MD as Referring Physician (Hematology and Oncology)  ASSESSMENT & PLAN:  CLL (chronic lymphocytic leukemia) ([place]) I have reviewed multiple imaging studies with the patient and her sister She has positive response to treatment However, due to signs of pneumonia and pancytopenia, we will delay treatment today and reschedule to next week The plan would be to proceed with several more cycles of treatment before repeating imaging study  Vitamin B12 deficiency She has intermittent pancytopenia which is multifactorial as well as vitamin B12 deficiency She will get vitamin B12 injection monthly  Pancytopenia, acquired ([place]) She has multifactorial pancytopenia, likely secondary to side effects of chemotherapy as well as B12 deficiency I will delay treatment and reschedule to next week  Left upper lobe pneumonia She has chronic cough for many months CT scan from yesterday showed signs of left upper lobe pneumonia She feels fine and has been afebrile I recommend delaying chemotherapy for 1 week I will give her a course of prednisone along with Augmentin   No orders of the defined types were placed in this encounter.   All questions were answered. The patient knows to call the clinic with any problems, questions or concerns. The total time spent in the appointment was 30 minutes encounter with patients including review of chart and various tests results, discussions about plan of care and coordination of care plan   Heath Lark, MD 04/29/2020 10:54 AM  INTERVAL HISTORY: Please see below for problem oriented charting. She returns with her sister for further follow-up She continues to have chronic cough No recent fever or chills No recent bleeding Denies recent nausea from treatment  SUMMARY OF ONCOLOGIC HISTORY: Oncology History Overview Note  FISH  del 13 q   CLL (chronic lymphocytic leukemia) ([place])  07/02/2010 Procedure   LN biopsy confirmed CLL   08/18/2010 Procedure   Bone marrow biopsy confirmed CLL   11/18/2010 - 04/15/2011 Chemotherapy   patient received 6 cycles of FLudarabine and Rituximab   01/13/2012 Relapse/Recurrence   Repeat BM biopsy confirmed relapse   01/27/2012 - 06/23/2012 Chemotherapy   Bendamustine & Rituximab given, complicated by infusion reaction to Rituximab, completed 6 cycles   06/29/2013 Imaging   Ct scan confirmed disease relapse with bulky lymphadenopathy   07/23/2013 Procedure   Repeat BM biopsy due to thrombocytopenia and progressive leukocytosis   08/02/2013 Relapse/Recurrence   Patient consented to start iburitinib as salvage therapy for relapsed CLL   11/09/2013 Imaging   Repeat CT scan the chest, abdomen and pelvis showed greater than 50% response to treatment   06/17/2014 Imaging   Repeat CT scan showed near complete response to treatment.   05/30/2015 Imaging   Repeat CT scan showed near complete response to treatment.   06/14/2016 Imaging   CT scan showed stable scattered small retroperitoneal lymph nodes and pelvic lymph nodes. No findings for recurrent or progressive lymphoma. Stable mild splenomegaly.   12/30/2017 Imaging   1. Recurrent adenopathy within the chest, abdomen, and pelvis. 2. Splenomegaly. 3. Aortic Atherosclerosis (ICD10-I70.0). 4. Coronary artery calcifications.   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 The catheter is ready for immediate use.   01/24/2018 -  Chemotherapy   The patient had Gazyva and 1 dose of bendamustine.  Bendamustine was discontinued due to inability to get insurance approval   05/08/2018 Imaging   1. Interval partial treatment response. Bilateral axillary, mediastinal, right hilar,  retroperitoneal and bilateral pelvic adenopathy is all decreased. Mild splenomegaly, decreased. No new or progressive disease. 2. Aortic Atherosclerosis (ICD10-I70.0).   01/31/2020 Imaging   1. Since 12/29/2017, marked progression of adenopathy within the chest, abdomen, and pelvis as detailed above. 2. Progressive splenomegaly, consistent with splenic involvement. 3. New pulmonary parenchymal findings which could represent interval infection (including atypical etiologies) or aspiration. 4. Coronary artery atherosclerosis. Aortic Atherosclerosis (ICD10-I70.0).   02/07/2020 -  Chemotherapy   The patient had palonosetron (ALOXI) injection 0.25 mg, 0.25 mg, Intravenous,  Once, 3 of 6 cycles Administration: 0.25 mg (02/07/2020), 0.25 mg (03/04/2020), 0.25 mg (04/01/2020) bendamustine (BENDEKA) 125 mg in sodium chloride 0.9 % 50 mL (2.2727 mg/mL) chemo infusion, 70 mg/m2 = 125 mg (100 % of original dose 70 mg/m2), Intravenous,  Once, 3 of 6 cycles Dose modification: 70 mg/m2 (original dose 70 mg/m2, Cycle 1, Reason: Provider Judgment), 56 mg/m2 (80 % of original dose 70 mg/m2, Cycle 3, Reason: Dose Not Tolerated) Administration: 125 mg (02/07/2020), 125 mg (02/08/2020), 125 mg (03/04/2020), 125 mg (03/05/2020), 100 mg (04/01/2020), 100 mg (04/02/2020)  for chemotherapy treatment.    04/28/2020 Imaging   Chest Impression:   1. New consolidation in the LEFT upper lobe is concerning for pneumonia versus lymphoma. Recommend clinical correlation with pulmonary infection. Favor recurrent pulmonary infection in light of RIGHT lung infection demonstrated on CT January 31, 2020. 2. Resolution of ground-glass densities in the RIGHT upper lobe and consolidation in the RIGHT lower lobe consistent resolved pulmonary infection. 3. Persistent enlarged axillary, mediastinal, and hilar lymph nodes.  Overall adenopathy is mildly improved.   Abdomen / Pelvis Impression:   1. Improved bulky retroperitoneal and iliac  lymphadenopathy. The large lymph nodes are measurably decreased in size although remain bulky. 2. Marked splenomegaly also improved.     REVIEW OF SYSTEMS:   Constitutional: Denies fevers, chills or abnormal weight loss Eyes: Denies blurriness of vision Ears, nose, mouth, throat, and face: Denies mucositis or sore throat Cardiovascular: Denies palpitation, chest discomfort or lower extremity swelling Gastrointestinal:  Denies nausea, heartburn or change in bowel habits Skin: Denies abnormal skin rashes Lymphatics: Denies new lymphadenopathy or easy bruising Neurological:Denies numbness, tingling or new weaknesses Behavioral/Psych: Mood is stable, no new changes  All other systems were reviewed with the patient and are negative.  I have reviewed the past medical history, past surgical history, social history and family history with the patient and they are unchanged from previous note.  ALLERGIES:  is allergic to azithromycin and ibuprofen.  MEDICATIONS:  Current Outpatient Medications  Medication Sig Dispense Refill  . acetaminophen (TYLENOL) 325 MG tablet Take 650 mg by mouth every 6 (six) hours as needed.    Marland Kitchen acyclovir (ZOVIRAX) 400 MG tablet Take 1 tablet (400 mg total) by mouth daily. 30 tablet 5  . albuterol (VENTOLIN HFA) 108 (90 Base) MCG/ACT inhaler Inhale 2 puffs into the lungs in the morning and at bedtime.    Marland Kitchen allopurinol (ZYLOPRIM) 300 MG tablet Take 1 tablet (300 mg total) by mouth daily. 30 tablet 0  . amLODipine (NORVASC) 10 MG tablet TAKE 1 TABLET (10 MG TOTAL) BY MOUTH DAILY. 90 tablet 1  . amoxicillin-clavulanate (AUGMENTIN) 875-125 MG tablet Take 1 tablet by mouth 2 (two) times daily. 14 tablet 0  . azelastine (ASTELIN) 0.1 % nasal spray Place 1 spray  into both nostrils 2 (two) times daily. Use in each nostril as directed    . bisoprolol (ZEBETA) 5 MG tablet Take 10 mg by mouth daily.   10  . Carboxymethylcellul-Glycerin (REFRESH OPTIVE SENSITIVE OP) Place 1 drop  into both eyes as needed.     . citalopram (CELEXA) 20 MG tablet Take 20 mg by mouth every morning.     . fluticasone-salmeterol (ADVAIR HFA) 115-21 MCG/ACT inhaler Inhale 2 puffs into the lungs 2 (two) times daily.    Marland Kitchen gabapentin (NEURONTIN) 300 MG capsule Take 300 mg by mouth at bedtime.    . hydrochlorothiazide (HYDRODIURIL) 25 MG tablet Take 25 mg by mouth daily.    Marland Kitchen levocetirizine (XYZAL) 5 MG tablet Take 5 mg by mouth every evening.    . lidocaine-prilocaine (EMLA) cream Apply to affected area once 30 g 5  . losartan-hydrochlorothiazide (HYZAAR) 100-25 MG tablet Take by mouth daily.   9  . Multiple Vitamins-Minerals (PRESERVISION AREDS 2) CAPS Take 1 capsule by mouth 2 (two) times daily.    . ondansetron (ZOFRAN) 8 MG tablet Take 1 tablet (8 mg total) by mouth 2 (two) times daily. Start second day after chemotherapy. Then take as needed for nausea or vomiting. 30 tablet 1  . predniSONE (DELTASONE) 20 MG tablet Take 3 tablets (60 mg total) by mouth daily with breakfast. 21 tablet 0  . prochlorperazine (COMPAZINE) 10 MG tablet Take 1 tablet (10 mg total) by mouth every 6 (six) hours as needed (Nausea or vomiting). 30 tablet 1  . sulfamethoxazole-trimethoprim (BACTRIM DS) 800-160 MG tablet Take 1 tablet by mouth 3 (three) times a week. 12 tablet 5  . triamcinolone (NASACORT) 55 MCG/ACT AERO nasal inhaler Place 2 sprays into the nose daily. 1 Inhaler 12  . Vitamin D, Ergocalciferol, 2000 units CAPS Take by mouth.     Current Facility-Administered Medications  Medication Dose Route Frequency Provider Last Rate Last Admin  . sodium chloride flush (NS) 0.9 % injection 10 mL  10 mL Intravenous PRN Alvy Bimler, Darrius Montano, MD   10 mL at 04/29/20 1052    PHYSICAL EXAMINATION: ECOG PERFORMANCE STATUS: 1 - Symptomatic but completely ambulatory  Vitals:   04/29/20 1029  BP: (!) 129/42  Pulse: 69  Resp: 18  Temp: 98.2 F (36.8 C)  SpO2: 97%   Filed Weights   04/29/20 1029  Weight: 252 lb 3.2 oz  (114.4 kg)    GENERAL:alert, no distress and comfortable SKIN: skin color, texture, turgor are normal, no rashes or significant lesions EYES: normal, Conjunctiva are pink and non-injected, sclera clear OROPHARYNX:no exudate, no erythema and lips, buccal mucosa, and tongue normal  NECK: supple, thyroid normal size, non-tender, without nodularity LYMPH:  no palpable lymphadenopathy in the cervical, axillary or inguinal LUNGS: clear to auscultation and percussion with normal breathing effort HEART: regular rate & rhythm and no murmurs and no lower extremity edema ABDOMEN:abdomen soft, non-tender and normal bowel sounds Musculoskeletal:no cyanosis of digits and no clubbing  NEURO: alert & oriented x 3 with fluent speech, no focal motor/sensory deficits  LABORATORY DATA:  I have reviewed the data as listed    Component Value Date/Time   NA 141 04/29/2020 0957   NA 141 08/23/2017 1026   K 4.1 04/29/2020 0957   K 3.7 08/23/2017 1026   CL 102 04/29/2020 0957   CL 98 02/02/2013 1353   CO2 28 04/29/2020 0957   CO2 33 (H) 08/23/2017 1026   GLUCOSE 106 (H) 04/29/2020 0957   GLUCOSE  92 08/23/2017 1026   GLUCOSE 99 02/02/2013 1353   BUN 19 04/29/2020 0957   BUN 21.0 08/23/2017 1026   CREATININE 1.13 (H) 04/29/2020 0957   CREATININE 1.2 (H) 08/23/2017 1026   CALCIUM 9.4 04/29/2020 0957   CALCIUM 9.3 08/23/2017 1026   PROT 5.9 (L) 04/29/2020 0957   PROT 5.9 (L) 03/28/2020 1044   PROT 6.3 (L) 08/23/2017 1026   ALBUMIN 3.9 04/29/2020 0957   ALBUMIN 3.8 08/23/2017 1026   AST 12 (L) 04/29/2020 0957   AST 11 08/23/2017 1026   ALT 12 04/29/2020 0957   ALT 10 08/23/2017 1026   ALKPHOS 83 04/29/2020 0957   ALKPHOS 66 08/23/2017 1026   BILITOT 0.6 04/29/2020 0957   BILITOT 0.59 08/23/2017 1026   GFRNONAA 48 (L) 04/29/2020 0957   GFRAA 55 (L) 04/29/2020 0957    No results found for: SPEP, UPEP  Lab Results  Component Value Date   WBC 13.0 (H) 04/29/2020   NEUTROABS 4.7 04/29/2020    HGB 11.6 (L) 04/29/2020   HCT 35.6 (L) 04/29/2020   MCV 87.3 04/29/2020   PLT 87 (L) 04/29/2020      Chemistry      Component Value Date/Time   NA 141 04/29/2020 0957   NA 141 08/23/2017 1026   K 4.1 04/29/2020 0957   K 3.7 08/23/2017 1026   CL 102 04/29/2020 0957   CL 98 02/02/2013 1353   CO2 28 04/29/2020 0957   CO2 33 (H) 08/23/2017 1026   BUN 19 04/29/2020 0957   BUN 21.0 08/23/2017 1026   CREATININE 1.13 (H) 04/29/2020 0957   CREATININE 1.2 (H) 08/23/2017 1026      Component Value Date/Time   CALCIUM 9.4 04/29/2020 0957   CALCIUM 9.3 08/23/2017 1026   ALKPHOS 83 04/29/2020 0957   ALKPHOS 66 08/23/2017 1026   AST 12 (L) 04/29/2020 0957   AST 11 08/23/2017 1026   ALT 12 04/29/2020 0957   ALT 10 08/23/2017 1026   BILITOT 0.6 04/29/2020 0957   BILITOT 0.59 08/23/2017 1026       RADIOGRAPHIC STUDIES: I have reviewed multiple imaging studies with the patient and her sister I have personally reviewed the radiological images as listed and agreed with the findings in the report. CT CHEST W CONTRAST  Result Date: 04/28/2020 CLINICAL DATA:  Chronic lymphocytic leukemia.  Chemotherapy ongoing. EXAM: CT CHEST, ABDOMEN, AND PELVIS WITH CONTRAST TECHNIQUE: Multidetector CT imaging of the chest, abdomen and pelvis was performed following the standard protocol during bolus administration of intravenous contrast. CONTRAST:  191m OMNIPAQUE IOHEXOL 300 MG/ML  SOLN COMPARISON:  CT 01/31/2020 FINDINGS: CT CHEST FINDINGS Cardiovascular: Port in the anterior chest wall with tip in distal SVC. Coronary artery calcification and aortic atherosclerotic calcification. Mediastinum/Nodes: Enlarged mediastinal lymph nodes again noted. For example lymph node posterior to the bronchus intermedius measures 21 mm compared to 21 mm (image 24/2). RIGHT paratracheal node measures 14 mm compared to 13 mm. Enlarged RIGHT hilar lymph node measures 19 mm compared to 22 mm. Prominent bilateral axillary lymph  nodes are slightly decreased. For example LEFT axillary lymph node measuring 10 mm (image 14/2) compares to 14 mm. Lungs/Pleura: There is new focus of masslike consolidation in the posterior LEFT upper lobe measuring 29 mm by 21 mm (image 25/series 2). There is segmental nodularity adjacent to this consolidation. Resolution of consolidation and peribronchial thickening in the RIGHT lower lobe. Resolution of the branching ground-glass densities in the RIGHT upper lobe. Small LEFT effusion. Musculoskeletal:  No aggressive osseous lesion. CT ABDOMEN AND PELVIS FINDINGS Hepatobiliary: No focal hepatic lesion. Postcholecystectomy. No biliary dilatation. Pancreas: Pancreas is normal. No ductal dilatation. No pancreatic inflammation. Spleen: Spleen is enlarged measuring 15.1 x 10.5 x 19.6 cm (volume = 1630 cm^3) compared to 15.7 x 11.2 x 21.9 cm (volume = 2020 cm^3). Adrenals/urinary tract: Adrenal glands and kidneys are normal. The ureters and bladder normal. Stomach/Bowel: Small hiatal hernia. Stomach, small-bowel appendix cecum normal. Colon and rectosigmoid colon normal. Vascular/Lymphatic: Intimal calcification abdominal aorta. Bulky periaortic retroperitoneal adenopathy again demonstrated. Lymph node LEFTof the aorta measures 34 mm compared with 40 mm (image 73/2). Adenopathy extends to the iliac chains. Example RIGHT common iliac lymph node measures 40 mm compared with 50 mm. LEFT external iliac lymph node measures 37 mm compared with 53 mm. No new adenopathy. Reproductive: Post hysterectomy anatomy Other: No free fluid. Musculoskeletal: No aggressive osseous lesion. Hemangioma within the T10 vertebral body. IMPRESSION: Chest Impression: 1. New consolidation in the LEFT upper lobe is concerning for pneumonia versus lymphoma. Recommend clinical correlation with pulmonary infection. Favor recurrent pulmonary infection in light of RIGHT lung infection demonstrated on CT January 31, 2020. 2. Resolution of ground-glass  densities in the RIGHT upper lobe and consolidation in the RIGHT lower lobe consistent resolved pulmonary infection. 3. Persistent enlarged axillary, mediastinal, and hilar lymph nodes. Overall adenopathy is mildly improved. Abdomen / Pelvis Impression: 1. Improved bulky retroperitoneal and iliac lymphadenopathy. The large lymph nodes are measurably decreased in size although remain bulky. 2. Marked splenomegaly also improved. Electronically Signed   By: Suzy Bouchard M.D.   On: 04/28/2020 11:15   CT ABDOMEN PELVIS W CONTRAST  Result Date: 04/28/2020 CLINICAL DATA:  Chronic lymphocytic leukemia.  Chemotherapy ongoing. EXAM: CT CHEST, ABDOMEN, AND PELVIS WITH CONTRAST TECHNIQUE: Multidetector CT imaging of the chest, abdomen and pelvis was performed following the standard protocol during bolus administration of intravenous contrast. CONTRAST:  157m OMNIPAQUE IOHEXOL 300 MG/ML  SOLN COMPARISON:  CT 01/31/2020 FINDINGS: CT CHEST FINDINGS Cardiovascular: Port in the anterior chest wall with tip in distal SVC. Coronary artery calcification and aortic atherosclerotic calcification. Mediastinum/Nodes: Enlarged mediastinal lymph nodes again noted. For example lymph node posterior to the bronchus intermedius measures 21 mm compared to 21 mm (image 24/2). RIGHT paratracheal node measures 14 mm compared to 13 mm. Enlarged RIGHT hilar lymph node measures 19 mm compared to 22 mm. Prominent bilateral axillary lymph nodes are slightly decreased. For example LEFT axillary lymph node measuring 10 mm (image 14/2) compares to 14 mm. Lungs/Pleura: There is new focus of masslike consolidation in the posterior LEFT upper lobe measuring 29 mm by 21 mm (image 25/series 2). There is segmental nodularity adjacent to this consolidation. Resolution of consolidation and peribronchial thickening in the RIGHT lower lobe. Resolution of the branching ground-glass densities in the RIGHT upper lobe. Small LEFT effusion. Musculoskeletal: No  aggressive osseous lesion. CT ABDOMEN AND PELVIS FINDINGS Hepatobiliary: No focal hepatic lesion. Postcholecystectomy. No biliary dilatation. Pancreas: Pancreas is normal. No ductal dilatation. No pancreatic inflammation. Spleen: Spleen is enlarged measuring 15.1 x 10.5 x 19.6 cm (volume = 1630 cm^3) compared to 15.7 x 11.2 x 21.9 cm (volume = 2020 cm^3). Adrenals/urinary tract: Adrenal glands and kidneys are normal. The ureters and bladder normal. Stomach/Bowel: Small hiatal hernia. Stomach, small-bowel appendix cecum normal. Colon and rectosigmoid colon normal. Vascular/Lymphatic: Intimal calcification abdominal aorta. Bulky periaortic retroperitoneal adenopathy again demonstrated. Lymph node LEFTof the aorta measures 34 mm compared with 40 mm (image 73/2). Adenopathy extends  to the iliac chains. Example RIGHT common iliac lymph node measures 40 mm compared with 50 mm. LEFT external iliac lymph node measures 37 mm compared with 53 mm. No new adenopathy. Reproductive: Post hysterectomy anatomy Other: No free fluid. Musculoskeletal: No aggressive osseous lesion. Hemangioma within the T10 vertebral body. IMPRESSION: Chest Impression: 1. New consolidation in the LEFT upper lobe is concerning for pneumonia versus lymphoma. Recommend clinical correlation with pulmonary infection. Favor recurrent pulmonary infection in light of RIGHT lung infection demonstrated on CT January 31, 2020. 2. Resolution of ground-glass densities in the RIGHT upper lobe and consolidation in the RIGHT lower lobe consistent resolved pulmonary infection. 3. Persistent enlarged axillary, mediastinal, and hilar lymph nodes. Overall adenopathy is mildly improved. Abdomen / Pelvis Impression: 1. Improved bulky retroperitoneal and iliac lymphadenopathy. The large lymph nodes are measurably decreased in size although remain bulky. 2. Marked splenomegaly also improved. Electronically Signed   By: Suzy Bouchard M.D.   On: 04/28/2020 11:15

## 2020-04-29 NOTE — Telephone Encounter (Signed)
Scheduled appts per 7/13 sch msg. Gave pt a print out of AVS.

## 2020-04-29 NOTE — Assessment & Plan Note (Signed)
She has intermittent pancytopenia which is multifactorial as well as vitamin B12 deficiency She will get vitamin B12 injection monthly 

## 2020-04-30 ENCOUNTER — Inpatient Hospital Stay: Payer: Medicare Other

## 2020-04-30 DIAGNOSIS — R262 Difficulty in walking, not elsewhere classified: Secondary | ICD-10-CM | POA: Diagnosis not present

## 2020-04-30 DIAGNOSIS — R2681 Unsteadiness on feet: Secondary | ICD-10-CM | POA: Diagnosis not present

## 2020-04-30 DIAGNOSIS — M6281 Muscle weakness (generalized): Secondary | ICD-10-CM | POA: Diagnosis not present

## 2020-05-01 ENCOUNTER — Telehealth: Payer: Self-pay

## 2020-05-01 NOTE — Telephone Encounter (Signed)
PROGRESS NOTE: Received fax from [facility] asking for clarification of stop date and dose of prednisone.   Fax sent back to [phone]): 1.Prednisone dose is 60mg  daily for 1 week by mouth. 2.Augmentin dose is 875mg  twice daily for 1 week by mouth. 3. Dr Alvy Bimler stated that she cannot write stop date because she does not know when she starts taking her pills and prescriptions were written for 1 week.  Fax came back recieved

## 2020-05-02 DIAGNOSIS — R2681 Unsteadiness on feet: Secondary | ICD-10-CM | POA: Diagnosis not present

## 2020-05-02 DIAGNOSIS — R262 Difficulty in walking, not elsewhere classified: Secondary | ICD-10-CM | POA: Diagnosis not present

## 2020-05-02 DIAGNOSIS — M6281 Muscle weakness (generalized): Secondary | ICD-10-CM | POA: Diagnosis not present

## 2020-05-05 ENCOUNTER — Other Ambulatory Visit: Payer: Self-pay

## 2020-05-05 ENCOUNTER — Inpatient Hospital Stay: Payer: Medicare Other

## 2020-05-05 VITALS — BP 120/54 | HR 55 | Temp 98.5°F | Resp 18

## 2020-05-05 DIAGNOSIS — Z5111 Encounter for antineoplastic chemotherapy: Secondary | ICD-10-CM | POA: Diagnosis not present

## 2020-05-05 DIAGNOSIS — C9110 Chronic lymphocytic leukemia of B-cell type not having achieved remission: Secondary | ICD-10-CM

## 2020-05-05 DIAGNOSIS — E538 Deficiency of other specified B group vitamins: Secondary | ICD-10-CM | POA: Diagnosis not present

## 2020-05-05 LAB — CBC WITH DIFFERENTIAL (CANCER CENTER ONLY)
Abs Immature Granulocytes: 0 10*3/uL (ref 0.00–0.07)
Basophils Absolute: 0 10*3/uL (ref 0.0–0.1)
Basophils Relative: 0 %
Eosinophils Absolute: 0.3 10*3/uL (ref 0.0–0.5)
Eosinophils Relative: 1 %
HCT: 39.9 % (ref 36.0–46.0)
Hemoglobin: 13.1 g/dL (ref 12.0–15.0)
Lymphocytes Relative: 64 %
Lymphs Abs: 21.5 10*3/uL — ABNORMAL HIGH (ref 0.7–4.0)
MCH: 28.1 pg (ref 26.0–34.0)
MCHC: 32.8 g/dL (ref 30.0–36.0)
MCV: 85.6 fL (ref 80.0–100.0)
Monocytes Absolute: 1.7 10*3/uL — ABNORMAL HIGH (ref 0.1–1.0)
Monocytes Relative: 5 %
Neutro Abs: 10.1 10*3/uL — ABNORMAL HIGH (ref 1.7–7.7)
Neutrophils Relative %: 30 %
Platelet Count: 183 10*3/uL (ref 150–400)
RBC: 4.66 MIL/uL (ref 3.87–5.11)
RDW: 14.6 % (ref 11.5–15.5)
WBC Count: 33.6 10*3/uL — ABNORMAL HIGH (ref 4.0–10.5)
nRBC: 0 % (ref 0.0–0.2)

## 2020-05-05 LAB — CMP (CANCER CENTER ONLY)
ALT: 12 U/L (ref 0–44)
AST: 11 U/L — ABNORMAL LOW (ref 15–41)
Albumin: 4.3 g/dL (ref 3.5–5.0)
Alkaline Phosphatase: 79 U/L (ref 38–126)
Anion gap: 13 (ref 5–15)
BUN: 29 mg/dL — ABNORMAL HIGH (ref 8–23)
CO2: 26 mmol/L (ref 22–32)
Calcium: 9.7 mg/dL (ref 8.9–10.3)
Chloride: 103 mmol/L (ref 98–111)
Creatinine: 1.18 mg/dL — ABNORMAL HIGH (ref 0.44–1.00)
GFR, Est AFR Am: 53 mL/min — ABNORMAL LOW (ref >60)
GFR, Estimated: 45 mL/min — ABNORMAL LOW (ref >60)
Glucose, Bld: 106 mg/dL — ABNORMAL HIGH (ref 70–99)
Potassium: 4.4 mmol/L (ref 3.5–5.1)
Sodium: 142 mmol/L (ref 135–145)
Total Bilirubin: 0.4 mg/dL (ref 0.3–1.2)
Total Protein: 6.3 g/dL — ABNORMAL LOW (ref 6.5–8.1)

## 2020-05-05 MED ORDER — SODIUM CHLORIDE 0.9 % IV SOLN
Freq: Once | INTRAVENOUS | Status: AC
  Filled 2020-05-05: qty 250

## 2020-05-05 MED ORDER — HEPARIN SOD (PORK) LOCK FLUSH 100 UNIT/ML IV SOLN
500.0000 [IU] | Freq: Once | INTRAVENOUS | Status: AC | PRN
  Administered 2020-05-05: 500 [IU]
  Filled 2020-05-05: qty 5

## 2020-05-05 MED ORDER — CYANOCOBALAMIN 1000 MCG/ML IJ SOLN
INTRAMUSCULAR | Status: AC
  Filled 2020-05-05: qty 1

## 2020-05-05 MED ORDER — SODIUM CHLORIDE 0.9% FLUSH
10.0000 mL | Freq: Once | INTRAVENOUS | Status: AC
  Administered 2020-05-05: 10 mL
  Filled 2020-05-05: qty 10

## 2020-05-05 MED ORDER — SODIUM CHLORIDE 0.9% FLUSH
10.0000 mL | INTRAVENOUS | Status: DC | PRN
  Administered 2020-05-05: 10 mL
  Filled 2020-05-05: qty 10

## 2020-05-05 MED ORDER — PALONOSETRON HCL INJECTION 0.25 MG/5ML
0.2500 mg | Freq: Once | INTRAVENOUS | Status: AC
  Administered 2020-05-05: 0.25 mg via INTRAVENOUS

## 2020-05-05 MED ORDER — PALONOSETRON HCL INJECTION 0.25 MG/5ML
INTRAVENOUS | Status: AC
  Filled 2020-05-05: qty 5

## 2020-05-05 MED ORDER — SODIUM CHLORIDE 0.9 % IV SOLN
56.0000 mg/m2 | Freq: Once | INTRAVENOUS | Status: AC
  Administered 2020-05-05: 100 mg via INTRAVENOUS
  Filled 2020-05-05: qty 4

## 2020-05-05 MED ORDER — SODIUM CHLORIDE 0.9 % IV SOLN
10.0000 mg | Freq: Once | INTRAVENOUS | Status: AC
  Administered 2020-05-05: 10 mg via INTRAVENOUS
  Filled 2020-05-05: qty 1
  Filled 2020-05-05: qty 10

## 2020-05-05 MED ORDER — CYANOCOBALAMIN 1000 MCG/ML IJ SOLN
1000.0000 ug | Freq: Once | INTRAMUSCULAR | Status: AC
  Administered 2020-05-05: 1000 ug via INTRAMUSCULAR

## 2020-05-05 NOTE — Progress Notes (Signed)
Patient requested to stay accessed for tomorrows treatment. Take home dressing and biopatch applied

## 2020-05-05 NOTE — Patient Instructions (Signed)
[facility] Discharge Instructions for Patients Receiving Chemotherapy  Today you received the following chemotherapy agents: Bendamustine  To help prevent nausea and vomiting after your treatment, we encourage you to take your nausea medication as directed.    If you develop nausea and vomiting that is not controlled by your nausea medication, call the clinic.   BELOW ARE SYMPTOMS THAT SHOULD BE REPORTED IMMEDIATELY:  *FEVER GREATER THAN 100.5 F  *CHILLS WITH OR WITHOUT FEVER  NAUSEA AND VOMITING THAT IS NOT CONTROLLED WITH YOUR NAUSEA MEDICATION  *UNUSUAL SHORTNESS OF BREATH  *UNUSUAL BRUISING OR BLEEDING  TENDERNESS IN MOUTH AND THROAT WITH OR WITHOUT PRESENCE OF ULCERS  *URINARY PROBLEMS  *BOWEL PROBLEMS  UNUSUAL RASH Items with * indicate a potential emergency and should be followed up as soon as possible.  Feel free to call the clinic should you have any questions or concerns. The clinic phone number is [phone].  Please show the CHEMO ALERT CARD at check-in to the Emergency Department and triage nurse.   

## 2020-05-06 ENCOUNTER — Other Ambulatory Visit: Payer: Self-pay

## 2020-05-06 ENCOUNTER — Inpatient Hospital Stay: Payer: Medicare Other

## 2020-05-06 VITALS — BP 142/55 | HR 61 | Temp 98.6°F | Resp 18

## 2020-05-06 DIAGNOSIS — M6281 Muscle weakness (generalized): Secondary | ICD-10-CM | POA: Diagnosis not present

## 2020-05-06 DIAGNOSIS — R2681 Unsteadiness on feet: Secondary | ICD-10-CM | POA: Diagnosis not present

## 2020-05-06 DIAGNOSIS — R262 Difficulty in walking, not elsewhere classified: Secondary | ICD-10-CM | POA: Diagnosis not present

## 2020-05-06 DIAGNOSIS — Z5111 Encounter for antineoplastic chemotherapy: Secondary | ICD-10-CM | POA: Diagnosis not present

## 2020-05-06 DIAGNOSIS — E538 Deficiency of other specified B group vitamins: Secondary | ICD-10-CM | POA: Diagnosis not present

## 2020-05-06 DIAGNOSIS — C9110 Chronic lymphocytic leukemia of B-cell type not having achieved remission: Secondary | ICD-10-CM

## 2020-05-06 MED ORDER — SODIUM CHLORIDE 0.9 % IV SOLN
10.0000 mg | Freq: Once | INTRAVENOUS | Status: AC
  Administered 2020-05-06: 10 mg via INTRAVENOUS
  Filled 2020-05-06: qty 10

## 2020-05-06 MED ORDER — SODIUM CHLORIDE 0.9 % IV SOLN
Freq: Once | INTRAVENOUS | Status: AC
  Filled 2020-05-06: qty 250

## 2020-05-06 MED ORDER — SODIUM CHLORIDE 0.9 % IV SOLN
56.0000 mg/m2 | Freq: Once | INTRAVENOUS | Status: AC
  Administered 2020-05-06: 100 mg via INTRAVENOUS
  Filled 2020-05-06: qty 4

## 2020-05-06 MED ORDER — SODIUM CHLORIDE 0.9% FLUSH
10.0000 mL | INTRAVENOUS | Status: DC | PRN
  Administered 2020-05-06: 10 mL
  Filled 2020-05-06: qty 10

## 2020-05-06 MED ORDER — HEPARIN SOD (PORK) LOCK FLUSH 100 UNIT/ML IV SOLN
500.0000 [IU] | Freq: Once | INTRAVENOUS | Status: AC | PRN
  Administered 2020-05-06: 500 [IU]
  Filled 2020-05-06: qty 5

## 2020-05-06 NOTE — Patient Instructions (Signed)
[facility] Discharge Instructions for Patients Receiving Chemotherapy  Today you received the following chemotherapy agents: Bendamustine  To help prevent nausea and vomiting after your treatment, we encourage you to take your nausea medication as directed.    If you develop nausea and vomiting that is not controlled by your nausea medication, call the clinic.   BELOW ARE SYMPTOMS THAT SHOULD BE REPORTED IMMEDIATELY:  *FEVER GREATER THAN 100.5 F  *CHILLS WITH OR WITHOUT FEVER  NAUSEA AND VOMITING THAT IS NOT CONTROLLED WITH YOUR NAUSEA MEDICATION  *UNUSUAL SHORTNESS OF BREATH  *UNUSUAL BRUISING OR BLEEDING  TENDERNESS IN MOUTH AND THROAT WITH OR WITHOUT PRESENCE OF ULCERS  *URINARY PROBLEMS  *BOWEL PROBLEMS  UNUSUAL RASH Items with * indicate a potential emergency and should be followed up as soon as possible.  Feel free to call the clinic should you have any questions or concerns. The clinic phone number is [phone].  Please show the CHEMO ALERT CARD at check-in to the Emergency Department and triage nurse.   

## 2020-05-08 DIAGNOSIS — M6281 Muscle weakness (generalized): Secondary | ICD-10-CM | POA: Diagnosis not present

## 2020-05-08 DIAGNOSIS — R2681 Unsteadiness on feet: Secondary | ICD-10-CM | POA: Diagnosis not present

## 2020-05-08 DIAGNOSIS — R262 Difficulty in walking, not elsewhere classified: Secondary | ICD-10-CM | POA: Diagnosis not present

## 2020-05-09 DIAGNOSIS — R262 Difficulty in walking, not elsewhere classified: Secondary | ICD-10-CM | POA: Diagnosis not present

## 2020-05-09 DIAGNOSIS — R2681 Unsteadiness on feet: Secondary | ICD-10-CM | POA: Diagnosis not present

## 2020-05-09 DIAGNOSIS — M6281 Muscle weakness (generalized): Secondary | ICD-10-CM | POA: Diagnosis not present

## 2020-05-13 DIAGNOSIS — R2681 Unsteadiness on feet: Secondary | ICD-10-CM | POA: Diagnosis not present

## 2020-05-13 DIAGNOSIS — M6281 Muscle weakness (generalized): Secondary | ICD-10-CM | POA: Diagnosis not present

## 2020-05-13 DIAGNOSIS — R262 Difficulty in walking, not elsewhere classified: Secondary | ICD-10-CM | POA: Diagnosis not present

## 2020-05-15 DIAGNOSIS — M6281 Muscle weakness (generalized): Secondary | ICD-10-CM | POA: Diagnosis not present

## 2020-05-15 DIAGNOSIS — R262 Difficulty in walking, not elsewhere classified: Secondary | ICD-10-CM | POA: Diagnosis not present

## 2020-05-15 DIAGNOSIS — R2681 Unsteadiness on feet: Secondary | ICD-10-CM | POA: Diagnosis not present

## 2020-05-16 DIAGNOSIS — R2681 Unsteadiness on feet: Secondary | ICD-10-CM | POA: Diagnosis not present

## 2020-05-16 DIAGNOSIS — R262 Difficulty in walking, not elsewhere classified: Secondary | ICD-10-CM | POA: Diagnosis not present

## 2020-05-16 DIAGNOSIS — M6281 Muscle weakness (generalized): Secondary | ICD-10-CM | POA: Diagnosis not present

## 2020-05-20 DIAGNOSIS — M6281 Muscle weakness (generalized): Secondary | ICD-10-CM | POA: Diagnosis not present

## 2020-05-20 DIAGNOSIS — R262 Difficulty in walking, not elsewhere classified: Secondary | ICD-10-CM | POA: Diagnosis not present

## 2020-05-20 DIAGNOSIS — R2681 Unsteadiness on feet: Secondary | ICD-10-CM | POA: Diagnosis not present

## 2020-05-22 DIAGNOSIS — M6281 Muscle weakness (generalized): Secondary | ICD-10-CM | POA: Diagnosis not present

## 2020-05-22 DIAGNOSIS — R262 Difficulty in walking, not elsewhere classified: Secondary | ICD-10-CM | POA: Diagnosis not present

## 2020-05-22 DIAGNOSIS — R2681 Unsteadiness on feet: Secondary | ICD-10-CM | POA: Diagnosis not present

## 2020-05-23 DIAGNOSIS — R2681 Unsteadiness on feet: Secondary | ICD-10-CM | POA: Diagnosis not present

## 2020-05-23 DIAGNOSIS — M6281 Muscle weakness (generalized): Secondary | ICD-10-CM | POA: Diagnosis not present

## 2020-05-23 DIAGNOSIS — R262 Difficulty in walking, not elsewhere classified: Secondary | ICD-10-CM | POA: Diagnosis not present

## 2020-05-27 DIAGNOSIS — R262 Difficulty in walking, not elsewhere classified: Secondary | ICD-10-CM | POA: Diagnosis not present

## 2020-05-27 DIAGNOSIS — R2681 Unsteadiness on feet: Secondary | ICD-10-CM | POA: Diagnosis not present

## 2020-05-27 DIAGNOSIS — M6281 Muscle weakness (generalized): Secondary | ICD-10-CM | POA: Diagnosis not present

## 2020-05-29 DIAGNOSIS — R262 Difficulty in walking, not elsewhere classified: Secondary | ICD-10-CM | POA: Diagnosis not present

## 2020-05-29 DIAGNOSIS — R2681 Unsteadiness on feet: Secondary | ICD-10-CM | POA: Diagnosis not present

## 2020-05-29 DIAGNOSIS — M6281 Muscle weakness (generalized): Secondary | ICD-10-CM | POA: Diagnosis not present

## 2020-05-30 DIAGNOSIS — R2681 Unsteadiness on feet: Secondary | ICD-10-CM | POA: Diagnosis not present

## 2020-05-30 DIAGNOSIS — R262 Difficulty in walking, not elsewhere classified: Secondary | ICD-10-CM | POA: Diagnosis not present

## 2020-05-30 DIAGNOSIS — M6281 Muscle weakness (generalized): Secondary | ICD-10-CM | POA: Diagnosis not present

## 2020-06-03 DIAGNOSIS — R2681 Unsteadiness on feet: Secondary | ICD-10-CM | POA: Diagnosis not present

## 2020-06-03 DIAGNOSIS — M6281 Muscle weakness (generalized): Secondary | ICD-10-CM | POA: Diagnosis not present

## 2020-06-03 DIAGNOSIS — R262 Difficulty in walking, not elsewhere classified: Secondary | ICD-10-CM | POA: Diagnosis not present

## 2020-06-05 DIAGNOSIS — R2681 Unsteadiness on feet: Secondary | ICD-10-CM | POA: Diagnosis not present

## 2020-06-05 DIAGNOSIS — M6281 Muscle weakness (generalized): Secondary | ICD-10-CM | POA: Diagnosis not present

## 2020-06-05 DIAGNOSIS — R262 Difficulty in walking, not elsewhere classified: Secondary | ICD-10-CM | POA: Diagnosis not present

## 2020-06-06 DIAGNOSIS — R262 Difficulty in walking, not elsewhere classified: Secondary | ICD-10-CM | POA: Diagnosis not present

## 2020-06-06 DIAGNOSIS — M6281 Muscle weakness (generalized): Secondary | ICD-10-CM | POA: Diagnosis not present

## 2020-06-06 DIAGNOSIS — R2681 Unsteadiness on feet: Secondary | ICD-10-CM | POA: Diagnosis not present

## 2020-06-09 ENCOUNTER — Other Ambulatory Visit: Payer: Self-pay

## 2020-06-09 ENCOUNTER — Inpatient Hospital Stay: Payer: Medicare Other | Admitting: Hematology and Oncology

## 2020-06-09 ENCOUNTER — Inpatient Hospital Stay: Payer: Medicare Other | Attending: Hematology and Oncology

## 2020-06-09 ENCOUNTER — Inpatient Hospital Stay: Payer: Medicare Other

## 2020-06-09 ENCOUNTER — Encounter: Payer: Self-pay | Admitting: Hematology and Oncology

## 2020-06-09 DIAGNOSIS — E538 Deficiency of other specified B group vitamins: Secondary | ICD-10-CM

## 2020-06-09 DIAGNOSIS — Z23 Encounter for immunization: Secondary | ICD-10-CM

## 2020-06-09 DIAGNOSIS — N183 Chronic kidney disease, stage 3 unspecified: Secondary | ICD-10-CM

## 2020-06-09 DIAGNOSIS — Z5111 Encounter for antineoplastic chemotherapy: Secondary | ICD-10-CM | POA: Insufficient documentation

## 2020-06-09 DIAGNOSIS — R059 Cough, unspecified: Secondary | ICD-10-CM

## 2020-06-09 DIAGNOSIS — D61818 Other pancytopenia: Secondary | ICD-10-CM

## 2020-06-09 DIAGNOSIS — C9110 Chronic lymphocytic leukemia of B-cell type not having achieved remission: Secondary | ICD-10-CM

## 2020-06-09 DIAGNOSIS — R05 Cough: Secondary | ICD-10-CM

## 2020-06-09 LAB — CBC WITH DIFFERENTIAL (CANCER CENTER ONLY)
Abs Immature Granulocytes: 0 10*3/uL (ref 0.00–0.07)
Band Neutrophils: 1 %
Basophils Absolute: 0 10*3/uL (ref 0.0–0.1)
Basophils Relative: 0 %
Eosinophils Absolute: 0 10*3/uL (ref 0.0–0.5)
Eosinophils Relative: 0 %
HCT: 36.1 % (ref 36.0–46.0)
Hemoglobin: 11.7 g/dL — ABNORMAL LOW (ref 12.0–15.0)
Lymphocytes Relative: 62 %
Lymphs Abs: 10 10*3/uL — ABNORMAL HIGH (ref 0.7–4.0)
MCH: 27.7 pg (ref 26.0–34.0)
MCHC: 32.4 g/dL (ref 30.0–36.0)
MCV: 85.5 fL (ref 80.0–100.0)
Monocytes Absolute: 0.8 10*3/uL (ref 0.1–1.0)
Monocytes Relative: 5 %
Neutro Abs: 5.3 10*3/uL (ref 1.7–7.7)
Neutrophils Relative %: 32 %
Platelet Count: 126 10*3/uL — ABNORMAL LOW (ref 150–400)
RBC: 4.22 MIL/uL (ref 3.87–5.11)
RDW: 14.4 % (ref 11.5–15.5)
WBC Count: 16.2 10*3/uL — ABNORMAL HIGH (ref 4.0–10.5)
nRBC: 0 % (ref 0.0–0.2)

## 2020-06-09 LAB — CMP (CANCER CENTER ONLY)
ALT: 10 U/L (ref 0–44)
AST: 14 U/L — ABNORMAL LOW (ref 15–41)
Albumin: 3.7 g/dL (ref 3.5–5.0)
Alkaline Phosphatase: 86 U/L (ref 38–126)
Anion gap: 10 (ref 5–15)
BUN: 22 mg/dL (ref 8–23)
CO2: 27 mmol/L (ref 22–32)
Calcium: 9.7 mg/dL (ref 8.9–10.3)
Chloride: 103 mmol/L (ref 98–111)
Creatinine: 1.29 mg/dL — ABNORMAL HIGH (ref 0.44–1.00)
GFR, Est AFR Am: 47 mL/min — ABNORMAL LOW (ref >60)
GFR, Estimated: 41 mL/min — ABNORMAL LOW (ref >60)
Glucose, Bld: 106 mg/dL — ABNORMAL HIGH (ref 70–99)
Potassium: 3.8 mmol/L (ref 3.5–5.1)
Sodium: 140 mmol/L (ref 135–145)
Total Bilirubin: 0.4 mg/dL (ref 0.3–1.2)
Total Protein: 5.9 g/dL — ABNORMAL LOW (ref 6.5–8.1)

## 2020-06-09 MED ORDER — PALONOSETRON HCL INJECTION 0.25 MG/5ML
0.2500 mg | Freq: Once | INTRAVENOUS | Status: AC
  Administered 2020-06-09: 0.25 mg via INTRAVENOUS

## 2020-06-09 MED ORDER — CYANOCOBALAMIN 1000 MCG/ML IJ SOLN
1000.0000 ug | Freq: Once | INTRAMUSCULAR | Status: AC
  Administered 2020-06-09: 1000 ug via INTRAMUSCULAR

## 2020-06-09 MED ORDER — HEPARIN SOD (PORK) LOCK FLUSH 100 UNIT/ML IV SOLN
500.0000 [IU] | Freq: Once | INTRAVENOUS | Status: DC | PRN
  Filled 2020-06-09: qty 5

## 2020-06-09 MED ORDER — SODIUM CHLORIDE 0.9 % IV SOLN
10.0000 mg | Freq: Once | INTRAVENOUS | Status: AC
  Administered 2020-06-09: 10 mg via INTRAVENOUS
  Filled 2020-06-09: qty 10

## 2020-06-09 MED ORDER — SODIUM CHLORIDE 0.9% FLUSH
10.0000 mL | INTRAVENOUS | Status: DC | PRN
  Filled 2020-06-09: qty 10

## 2020-06-09 MED ORDER — CYANOCOBALAMIN 1000 MCG/ML IJ SOLN
INTRAMUSCULAR | Status: AC
  Filled 2020-06-09: qty 1

## 2020-06-09 MED ORDER — SODIUM CHLORIDE 0.9 % IV SOLN
56.0000 mg/m2 | Freq: Once | INTRAVENOUS | Status: AC
  Administered 2020-06-09: 100 mg via INTRAVENOUS
  Filled 2020-06-09: qty 4

## 2020-06-09 MED ORDER — PALONOSETRON HCL INJECTION 0.25 MG/5ML
INTRAVENOUS | Status: AC
  Filled 2020-06-09: qty 5

## 2020-06-09 MED ORDER — SODIUM CHLORIDE 0.9 % IV SOLN
Freq: Once | INTRAVENOUS | Status: AC
  Filled 2020-06-09: qty 250

## 2020-06-09 NOTE — Assessment & Plan Note (Signed)
She has intermittent pancytopenia which is multifactorial as well as vitamin B12 deficiency She will get vitamin B12 injection monthly

## 2020-06-09 NOTE — Assessment & Plan Note (Signed)
Her renal function has improved/stable Continue close observation 

## 2020-06-09 NOTE — Assessment & Plan Note (Signed)
She has multifactorial pancytopenia, likely secondary to side effects of chemotherapy as well as B12 deficiency We will proceed with treatment but her next therapy will be slightly delayed to allow bone marrow recovery

## 2020-06-09 NOTE — Progress Notes (Signed)
[place] OFFICE PROGRESS NOTE  Patient Care Team: Jonathon Jordan, MD as PCP - General (Family Medicine) Heath Lark, MD as Referring Physician (Hematology and Oncology)  ASSESSMENT & PLAN:  CLL (chronic lymphocytic leukemia) ([place]) She has positive response to treatment She is taking slightly more time per month to recover from side effects of treatment I recommend lengthening each cycle of treatment to 5 to 6 weeks I will see her back in October for further follow-up with treatment #6  Vitamin B12 deficiency She has intermittent pancytopenia which is multifactorial as well as vitamin B12 deficiency She will get vitamin B12 injection monthly  Pancytopenia, acquired ([place]) She has multifactorial pancytopenia, likely secondary to side effects of chemotherapy as well as B12 deficiency We will proceed with treatment but her next therapy will be slightly delayed to allow bone marrow recovery  CKD (chronic kidney disease), symptom management only, stage 3 (moderate) Her renal function has improved/stable Continue close observation  Cough She has chronic cough Her primary care doctor felt this is related to allergies We discussed potential referral to pulmonologist or allergist but her sister felt this is not necessary at this point   No orders of the defined types were placed in this encounter.   All questions were answered. The patient knows to call the clinic with any problems, questions or concerns. The total time spent in the appointment was 25 minutes encounter with patients including review of chart and various tests results, discussions about plan of care and coordination of care plan   Heath Lark, MD 06/09/2020 12:10 PM  INTERVAL HISTORY: Please see below for problem oriented charting. She returns with her sister for further follow-up She continues to have mild chronic cough No recent infection, fever or chills No recent falls The patient denies any  recent signs or symptoms of bleeding such as spontaneous epistaxis, hematuria or hematochezia.   SUMMARY OF ONCOLOGIC HISTORY: Oncology History Overview Note  FISH del 13 q   CLL (chronic lymphocytic leukemia) ([place])  07/02/2010 Procedure   LN biopsy confirmed CLL   08/18/2010 Procedure   Bone marrow biopsy confirmed CLL   11/18/2010 - 04/15/2011 Chemotherapy   patient received 6 cycles of FLudarabine and Rituximab   01/13/2012 Relapse/Recurrence   Repeat BM biopsy confirmed relapse   01/27/2012 - 06/23/2012 Chemotherapy   Bendamustine & Rituximab given, complicated by infusion reaction to Rituximab, completed 6 cycles   06/29/2013 Imaging   Ct scan confirmed disease relapse with bulky lymphadenopathy   07/23/2013 Procedure   Repeat BM biopsy due to thrombocytopenia and progressive leukocytosis   08/02/2013 Relapse/Recurrence   Patient consented to start iburitinib as salvage therapy for relapsed CLL   11/09/2013 Imaging   Repeat CT scan the chest, abdomen and pelvis showed greater than 50% response to treatment   06/17/2014 Imaging   Repeat CT scan showed near complete response to treatment.   05/30/2015 Imaging   Repeat CT scan showed near complete response to treatment.   06/14/2016 Imaging   CT scan showed stable scattered small retroperitoneal lymph nodes and pelvic lymph nodes. No findings for recurrent or progressive lymphoma. Stable mild splenomegaly.   12/30/2017 Imaging   1. Recurrent adenopathy within the chest, abdomen, and pelvis. 2. Splenomegaly. 3. Aortic Atherosclerosis (ICD10-I70.0). 4. Coronary artery calcifications.   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  The catheter is ready for immediate use.   01/24/2018 -  Chemotherapy   The patient had Gazyva and 1 dose of bendamustine. Bendamustine was discontinued  due to inability to get insurance approval   05/08/2018 Imaging   1. Interval partial treatment response. Bilateral axillary, mediastinal, right hilar, retroperitoneal and bilateral pelvic adenopathy is all decreased. Mild splenomegaly, decreased. No new or progressive disease. 2. Aortic Atherosclerosis (ICD10-I70.0).   01/31/2020 Imaging   1. Since 12/29/2017, marked progression of adenopathy within the chest, abdomen, and pelvis as detailed above. 2. Progressive splenomegaly, consistent with splenic involvement. 3. New pulmonary parenchymal findings which could represent interval infection (including atypical etiologies) or aspiration. 4. Coronary artery atherosclerosis. Aortic Atherosclerosis (ICD10-I70.0).   02/07/2020 -  Chemotherapy   The patient had palonosetron (ALOXI) injection 0.25 mg, 0.25 mg, Intravenous,  Once, 5 of 6 cycles Administration: 0.25 mg (02/07/2020), 0.25 mg (03/04/2020), 0.25 mg (04/01/2020), 0.25 mg (05/05/2020) bendamustine (BENDEKA) 125 mg in sodium chloride 0.9 % 50 mL (2.2727 mg/mL) chemo infusion, 70 mg/m2 = 125 mg (100 % of original dose 70 mg/m2), Intravenous,  Once, 5 of 6 cycles Dose modification: 70 mg/m2 (original dose 70 mg/m2, Cycle 1, Reason: Provider Judgment), 56 mg/m2 (80 % of original dose 70 mg/m2, Cycle 3, Reason: Dose Not Tolerated) Administration: 125 mg (02/07/2020), 125 mg (02/08/2020), 125 mg (03/04/2020), 125 mg (03/05/2020), 100 mg (04/01/2020), 100 mg (04/02/2020), 100 mg (05/05/2020), 100 mg (05/06/2020)  for chemotherapy treatment.    04/28/2020 Imaging   Chest Impression:   1. New consolidation in the LEFT upper lobe is concerning for pneumonia versus lymphoma. Recommend clinical correlation with pulmonary infection. Favor recurrent pulmonary infection in light of RIGHT lung infection demonstrated on CT January 31, 2020. 2. Resolution of ground-glass densities in the RIGHT upper lobe and consolidation in the RIGHT lower lobe consistent resolved  pulmonary infection. 3. Persistent enlarged axillary, mediastinal, and hilar lymph nodes.  Overall adenopathy is mildly improved.   Abdomen / Pelvis Impression:   1. Improved bulky retroperitoneal and iliac lymphadenopathy. The large lymph nodes are measurably decreased in size although remain bulky. 2. Marked splenomegaly also improved.     REVIEW OF SYSTEMS:   Constitutional: Denies fevers, chills or abnormal weight loss Eyes: Denies blurriness of vision Ears, nose, mouth, throat, and face: Denies mucositis or sore throat Cardiovascular: Denies palpitation, chest discomfort or lower extremity swelling Gastrointestinal:  Denies nausea, heartburn or change in bowel habits Skin: Denies abnormal skin rashes Lymphatics: Denies new lymphadenopathy or easy bruising Neurological:Denies numbness, tingling or new weaknesses Behavioral/Psych: Mood is stable, no new changes  All other systems were reviewed with the patient and are negative.  I have reviewed the past medical history, past surgical history, social history and family history with the patient and they are unchanged from previous note.  ALLERGIES:  is allergic to azithromycin and ibuprofen.  MEDICATIONS:  Current Outpatient Medications  Medication Sig Dispense Refill  . acetaminophen (TYLENOL) 325 MG tablet Take 650 mg by mouth every 6 (six) hours as needed.    Marland Kitchen acyclovir (ZOVIRAX) 400 MG tablet Take 1 tablet (400 mg total) by mouth daily. 30 tablet 5  . albuterol (VENTOLIN HFA) 108 (90 Base) MCG/ACT inhaler Inhale 2 puffs into the lungs in the morning and at bedtime.    Marland Kitchen allopurinol (ZYLOPRIM) 300 MG tablet Take 1 tablet (300 mg total) by mouth daily. 30 tablet 0  . amLODipine (NORVASC) 10 MG tablet TAKE 1 TABLET (10 MG TOTAL) BY MOUTH DAILY.  90 tablet 1  . amoxicillin-clavulanate (AUGMENTIN) 875-125 MG tablet Take 1 tablet by mouth 2 (two) times daily. 14 tablet 0  . azelastine (ASTELIN) 0.1 % nasal spray Place 1 spray into  both nostrils 2 (two) times daily. Use in each nostril as directed    . bisoprolol (ZEBETA) 5 MG tablet Take 10 mg by mouth daily.   10  . Carboxymethylcellul-Glycerin (REFRESH OPTIVE SENSITIVE OP) Place 1 drop into both eyes as needed.     . citalopram (CELEXA) 20 MG tablet Take 20 mg by mouth every morning.     . fluticasone-salmeterol (ADVAIR HFA) 115-21 MCG/ACT inhaler Inhale 2 puffs into the lungs 2 (two) times daily.    Marland Kitchen gabapentin (NEURONTIN) 300 MG capsule Take 300 mg by mouth at bedtime.    . hydrochlorothiazide (HYDRODIURIL) 25 MG tablet Take 25 mg by mouth daily.    Marland Kitchen levocetirizine (XYZAL) 5 MG tablet Take 5 mg by mouth every evening.    . lidocaine-prilocaine (EMLA) cream Apply to affected area once 30 g 5  . losartan-hydrochlorothiazide (HYZAAR) 100-25 MG tablet Take by mouth daily.   9  . Multiple Vitamins-Minerals (PRESERVISION AREDS 2) CAPS Take 1 capsule by mouth 2 (two) times daily.    . ondansetron (ZOFRAN) 8 MG tablet Take 1 tablet (8 mg total) by mouth 2 (two) times daily. Start second day after chemotherapy. Then take as needed for nausea or vomiting. 30 tablet 1  . predniSONE (DELTASONE) 20 MG tablet Take 3 tablets (60 mg total) by mouth daily with breakfast. 21 tablet 0  . prochlorperazine (COMPAZINE) 10 MG tablet Take 1 tablet (10 mg total) by mouth every 6 (six) hours as needed (Nausea or vomiting). 30 tablet 1  . sulfamethoxazole-trimethoprim (BACTRIM DS) 800-160 MG tablet Take 1 tablet by mouth 3 (three) times a week. 12 tablet 5  . triamcinolone (NASACORT) 55 MCG/ACT AERO nasal inhaler Place 2 sprays into the nose daily. 1 Inhaler 12  . Vitamin D, Ergocalciferol, 2000 units CAPS Take by mouth.     No current facility-administered medications for this visit.   Facility-Administered Medications Ordered in Other Visits  Medication Dose Route Frequency Provider Last Rate Last Admin  . bendamustine (BENDEKA) 100 mg in sodium chloride 0.9 % 50 mL (1.8519 mg/mL) chemo  infusion  56 mg/m2 (Treatment Plan Adjusted) Intravenous Once Alvy Bimler, Leonte Horrigan, MD      . heparin lock flush 100 unit/mL  500 Units Intracatheter Once PRN Alvy Bimler, Tasharra Nodine, MD      . sodium chloride flush (NS) 0.9 % injection 10 mL  10 mL Intracatheter PRN Alvy Bimler, Makhai Fulco, MD        PHYSICAL EXAMINATION: ECOG PERFORMANCE STATUS: 1 - Symptomatic but completely ambulatory  Vitals:   06/09/20 1100  BP: (!) 141/57  Pulse: 70  Resp: 18  Temp: 98.3 F (36.8 C)  SpO2: 95%   Filed Weights   06/09/20 1100  Weight: 249 lb 3.2 oz (113 kg)    GENERAL:alert, no distress and comfortable SKIN: skin color, texture, turgor are normal, no rashes or significant lesions EYES: normal, Conjunctiva are pink and non-injected, sclera clear OROPHARYNX:no exudate, no erythema and lips, buccal mucosa, and tongue normal  NECK: supple, thyroid normal size, non-tender, without nodularity LYMPH:  no palpable lymphadenopathy in the cervical, axillary or inguinal LUNGS: clear to auscultation and percussion with normal breathing effort HEART: regular rate & rhythm and no murmurs and no lower extremity edema ABDOMEN:abdomen soft, non-tender and normal bowel sounds Musculoskeletal:no cyanosis of  digits and no clubbing  NEURO: alert & oriented x 3 with fluent speech, no focal motor/sensory deficits  LABORATORY DATA:  I have reviewed the data as listed    Component Value Date/Time   NA 140 06/09/2020 1037   NA 141 08/23/2017 1026   K 3.8 06/09/2020 1037   K 3.7 08/23/2017 1026   CL 103 06/09/2020 1037   CL 98 02/02/2013 1353   CO2 27 06/09/2020 1037   CO2 33 (H) 08/23/2017 1026   GLUCOSE 106 (H) 06/09/2020 1037   GLUCOSE 92 08/23/2017 1026   GLUCOSE 99 02/02/2013 1353   BUN 22 06/09/2020 1037   BUN 21.0 08/23/2017 1026   CREATININE 1.29 (H) 06/09/2020 1037   CREATININE 1.2 (H) 08/23/2017 1026   CALCIUM 9.7 06/09/2020 1037   CALCIUM 9.3 08/23/2017 1026   PROT 5.9 (L) 06/09/2020 1037   PROT 5.9 (L) 03/28/2020 1044    PROT 6.3 (L) 08/23/2017 1026   ALBUMIN 3.7 06/09/2020 1037   ALBUMIN 3.8 08/23/2017 1026   AST 14 (L) 06/09/2020 1037   AST 11 08/23/2017 1026   ALT 10 06/09/2020 1037   ALT 10 08/23/2017 1026   ALKPHOS 86 06/09/2020 1037   ALKPHOS 66 08/23/2017 1026   BILITOT 0.4 06/09/2020 1037   BILITOT 0.59 08/23/2017 1026   GFRNONAA 41 (L) 06/09/2020 1037   GFRAA 47 (L) 06/09/2020 1037    No results found for: SPEP, UPEP  Lab Results  Component Value Date   WBC 16.2 (H) 06/09/2020   NEUTROABS 5.3 06/09/2020   HGB 11.7 (L) 06/09/2020   HCT 36.1 06/09/2020   MCV 85.5 06/09/2020   PLT 126 (L) 06/09/2020      Chemistry      Component Value Date/Time   NA 140 06/09/2020 1037   NA 141 08/23/2017 1026   K 3.8 06/09/2020 1037   K 3.7 08/23/2017 1026   CL 103 06/09/2020 1037   CL 98 02/02/2013 1353   CO2 27 06/09/2020 1037   CO2 33 (H) 08/23/2017 1026   BUN 22 06/09/2020 1037   BUN 21.0 08/23/2017 1026   CREATININE 1.29 (H) 06/09/2020 1037   CREATININE 1.2 (H) 08/23/2017 1026      Component Value Date/Time   CALCIUM 9.7 06/09/2020 1037   CALCIUM 9.3 08/23/2017 1026   ALKPHOS 86 06/09/2020 1037   ALKPHOS 66 08/23/2017 1026   AST 14 (L) 06/09/2020 1037   AST 11 08/23/2017 1026   ALT 10 06/09/2020 1037   ALT 10 08/23/2017 1026   BILITOT 0.4 06/09/2020 1037   BILITOT 0.59 08/23/2017 1026

## 2020-06-09 NOTE — Assessment & Plan Note (Signed)
She has positive response to treatment She is taking slightly more time per month to recover from side effects of treatment I recommend lengthening each cycle of treatment to 5 to 6 weeks I will see her back in October for further follow-up with treatment #6

## 2020-06-09 NOTE — Patient Instructions (Signed)
[facility] [facility] Discharge Instructions for Patients Receiving Chemotherapy  Today you received the following chemotherapy agents: Bendamustine  To help prevent nausea and vomiting after your treatment, we encourage you to take your nausea medication as directed.    If you develop nausea and vomiting that is not controlled by your nausea medication, call the clinic.   BELOW ARE SYMPTOMS THAT SHOULD BE REPORTED IMMEDIATELY:  *FEVER GREATER THAN 100.5 F  *CHILLS WITH OR WITHOUT FEVER  NAUSEA AND VOMITING THAT IS NOT CONTROLLED WITH YOUR NAUSEA MEDICATION  *UNUSUAL SHORTNESS OF BREATH  *UNUSUAL BRUISING OR BLEEDING  TENDERNESS IN MOUTH AND THROAT WITH OR WITHOUT PRESENCE OF ULCERS  *URINARY PROBLEMS  *BOWEL PROBLEMS  UNUSUAL RASH Items with * indicate a potential emergency and should be followed up as soon as possible.  Feel free to call the clinic should you have any questions or concerns. The clinic phone number is [phone].  Please show the [place] at check-in to the Emergency Department and triage nurse.

## 2020-06-09 NOTE — Assessment & Plan Note (Signed)
She has chronic cough Her primary care doctor felt this is related to allergies We discussed potential referral to pulmonologist or allergist but her sister felt this is not necessary at this point

## 2020-06-10 ENCOUNTER — Inpatient Hospital Stay: Payer: Medicare Other

## 2020-06-10 ENCOUNTER — Other Ambulatory Visit: Payer: Self-pay

## 2020-06-10 VITALS — BP 117/58 | HR 70 | Temp 98.5°F | Resp 16

## 2020-06-10 DIAGNOSIS — Z23 Encounter for immunization: Secondary | ICD-10-CM | POA: Diagnosis not present

## 2020-06-10 DIAGNOSIS — E538 Deficiency of other specified B group vitamins: Secondary | ICD-10-CM | POA: Diagnosis not present

## 2020-06-10 DIAGNOSIS — C9110 Chronic lymphocytic leukemia of B-cell type not having achieved remission: Secondary | ICD-10-CM | POA: Diagnosis not present

## 2020-06-10 DIAGNOSIS — D61818 Other pancytopenia: Secondary | ICD-10-CM | POA: Diagnosis not present

## 2020-06-10 DIAGNOSIS — Z5111 Encounter for antineoplastic chemotherapy: Secondary | ICD-10-CM | POA: Diagnosis not present

## 2020-06-10 MED ORDER — SODIUM CHLORIDE 0.9% FLUSH
10.0000 mL | INTRAVENOUS | Status: DC | PRN
  Administered 2020-06-10: 10 mL
  Filled 2020-06-10: qty 10

## 2020-06-10 MED ORDER — SODIUM CHLORIDE 0.9 % IV SOLN
56.0000 mg/m2 | Freq: Once | INTRAVENOUS | Status: AC
  Administered 2020-06-10: 100 mg via INTRAVENOUS
  Filled 2020-06-10: qty 4

## 2020-06-10 MED ORDER — SODIUM CHLORIDE 0.9 % IV SOLN
Freq: Once | INTRAVENOUS | Status: AC
  Filled 2020-06-10: qty 250

## 2020-06-10 MED ORDER — HEPARIN SOD (PORK) LOCK FLUSH 100 UNIT/ML IV SOLN
500.0000 [IU] | Freq: Once | INTRAVENOUS | Status: AC | PRN
  Administered 2020-06-10: 500 [IU]
  Filled 2020-06-10: qty 5

## 2020-06-10 MED ORDER — SODIUM CHLORIDE 0.9 % IV SOLN
10.0000 mg | Freq: Once | INTRAVENOUS | Status: AC
  Administered 2020-06-10: 10 mg via INTRAVENOUS
  Filled 2020-06-10: qty 10

## 2020-06-10 NOTE — Patient Instructions (Signed)
[facility] Discharge Instructions for Patients Receiving Chemotherapy  Today you received the following chemotherapy agents [place].  To help prevent nausea and vomiting after your treatment, we encourage you to take your nausea medication as directed.   If you develop nausea and vomiting that is not controlled by your nausea medication, call the clinic.   BELOW ARE SYMPTOMS THAT SHOULD BE REPORTED IMMEDIATELY:  *FEVER GREATER THAN 100.5 F  *CHILLS WITH OR WITHOUT FEVER  NAUSEA AND VOMITING THAT IS NOT CONTROLLED WITH YOUR NAUSEA MEDICATION  *UNUSUAL SHORTNESS OF BREATH  *UNUSUAL BRUISING OR BLEEDING  TENDERNESS IN MOUTH AND THROAT WITH OR WITHOUT PRESENCE OF ULCERS  *URINARY PROBLEMS  *BOWEL PROBLEMS  UNUSUAL RASH Items with * indicate a potential emergency and should be followed up as soon as possible.  Feel free to call the clinic should you have any questions or concerns. The clinic phone number is [phone].  Please show the CHEMO ALERT CARD at check-in to the Emergency Department and triage nurse.   

## 2020-06-12 DIAGNOSIS — R2681 Unsteadiness on feet: Secondary | ICD-10-CM | POA: Diagnosis not present

## 2020-06-12 DIAGNOSIS — R262 Difficulty in walking, not elsewhere classified: Secondary | ICD-10-CM | POA: Diagnosis not present

## 2020-06-12 DIAGNOSIS — M6281 Muscle weakness (generalized): Secondary | ICD-10-CM | POA: Diagnosis not present

## 2020-06-13 DIAGNOSIS — R262 Difficulty in walking, not elsewhere classified: Secondary | ICD-10-CM | POA: Diagnosis not present

## 2020-06-13 DIAGNOSIS — M6281 Muscle weakness (generalized): Secondary | ICD-10-CM | POA: Diagnosis not present

## 2020-06-13 DIAGNOSIS — R2681 Unsteadiness on feet: Secondary | ICD-10-CM | POA: Diagnosis not present

## 2020-06-17 DIAGNOSIS — M6281 Muscle weakness (generalized): Secondary | ICD-10-CM | POA: Diagnosis not present

## 2020-06-17 DIAGNOSIS — R262 Difficulty in walking, not elsewhere classified: Secondary | ICD-10-CM | POA: Diagnosis not present

## 2020-06-17 DIAGNOSIS — R2681 Unsteadiness on feet: Secondary | ICD-10-CM | POA: Diagnosis not present

## 2020-06-18 DIAGNOSIS — R262 Difficulty in walking, not elsewhere classified: Secondary | ICD-10-CM | POA: Diagnosis not present

## 2020-06-18 DIAGNOSIS — M6281 Muscle weakness (generalized): Secondary | ICD-10-CM | POA: Diagnosis not present

## 2020-06-18 DIAGNOSIS — R2681 Unsteadiness on feet: Secondary | ICD-10-CM | POA: Diagnosis not present

## 2020-06-19 DIAGNOSIS — M6281 Muscle weakness (generalized): Secondary | ICD-10-CM | POA: Diagnosis not present

## 2020-06-19 DIAGNOSIS — R262 Difficulty in walking, not elsewhere classified: Secondary | ICD-10-CM | POA: Diagnosis not present

## 2020-06-19 DIAGNOSIS — R2681 Unsteadiness on feet: Secondary | ICD-10-CM | POA: Diagnosis not present

## 2020-06-24 DIAGNOSIS — M6281 Muscle weakness (generalized): Secondary | ICD-10-CM | POA: Diagnosis not present

## 2020-06-24 DIAGNOSIS — R2681 Unsteadiness on feet: Secondary | ICD-10-CM | POA: Diagnosis not present

## 2020-06-24 DIAGNOSIS — R262 Difficulty in walking, not elsewhere classified: Secondary | ICD-10-CM | POA: Diagnosis not present

## 2020-06-26 DIAGNOSIS — R2681 Unsteadiness on feet: Secondary | ICD-10-CM | POA: Diagnosis not present

## 2020-06-26 DIAGNOSIS — R262 Difficulty in walking, not elsewhere classified: Secondary | ICD-10-CM | POA: Diagnosis not present

## 2020-06-26 DIAGNOSIS — M6281 Muscle weakness (generalized): Secondary | ICD-10-CM | POA: Diagnosis not present

## 2020-06-27 DIAGNOSIS — M6281 Muscle weakness (generalized): Secondary | ICD-10-CM | POA: Diagnosis not present

## 2020-06-27 DIAGNOSIS — R2681 Unsteadiness on feet: Secondary | ICD-10-CM | POA: Diagnosis not present

## 2020-06-27 DIAGNOSIS — R262 Difficulty in walking, not elsewhere classified: Secondary | ICD-10-CM | POA: Diagnosis not present

## 2020-07-01 DIAGNOSIS — R262 Difficulty in walking, not elsewhere classified: Secondary | ICD-10-CM | POA: Diagnosis not present

## 2020-07-01 DIAGNOSIS — M6281 Muscle weakness (generalized): Secondary | ICD-10-CM | POA: Diagnosis not present

## 2020-07-01 DIAGNOSIS — R2681 Unsteadiness on feet: Secondary | ICD-10-CM | POA: Diagnosis not present

## 2020-07-02 DIAGNOSIS — R262 Difficulty in walking, not elsewhere classified: Secondary | ICD-10-CM | POA: Diagnosis not present

## 2020-07-02 DIAGNOSIS — M6281 Muscle weakness (generalized): Secondary | ICD-10-CM | POA: Diagnosis not present

## 2020-07-02 DIAGNOSIS — R2681 Unsteadiness on feet: Secondary | ICD-10-CM | POA: Diagnosis not present

## 2020-07-08 DIAGNOSIS — M6281 Muscle weakness (generalized): Secondary | ICD-10-CM | POA: Diagnosis not present

## 2020-07-08 DIAGNOSIS — R2681 Unsteadiness on feet: Secondary | ICD-10-CM | POA: Diagnosis not present

## 2020-07-08 DIAGNOSIS — R262 Difficulty in walking, not elsewhere classified: Secondary | ICD-10-CM | POA: Diagnosis not present

## 2020-07-10 DIAGNOSIS — R2681 Unsteadiness on feet: Secondary | ICD-10-CM | POA: Diagnosis not present

## 2020-07-10 DIAGNOSIS — M6281 Muscle weakness (generalized): Secondary | ICD-10-CM | POA: Diagnosis not present

## 2020-07-10 DIAGNOSIS — R262 Difficulty in walking, not elsewhere classified: Secondary | ICD-10-CM | POA: Diagnosis not present

## 2020-07-11 DIAGNOSIS — M6281 Muscle weakness (generalized): Secondary | ICD-10-CM | POA: Diagnosis not present

## 2020-07-11 DIAGNOSIS — R262 Difficulty in walking, not elsewhere classified: Secondary | ICD-10-CM | POA: Diagnosis not present

## 2020-07-11 DIAGNOSIS — R2681 Unsteadiness on feet: Secondary | ICD-10-CM | POA: Diagnosis not present

## 2020-07-15 DIAGNOSIS — R262 Difficulty in walking, not elsewhere classified: Secondary | ICD-10-CM | POA: Diagnosis not present

## 2020-07-15 DIAGNOSIS — M6281 Muscle weakness (generalized): Secondary | ICD-10-CM | POA: Diagnosis not present

## 2020-07-15 DIAGNOSIS — R2681 Unsteadiness on feet: Secondary | ICD-10-CM | POA: Diagnosis not present

## 2020-07-17 DIAGNOSIS — R2681 Unsteadiness on feet: Secondary | ICD-10-CM | POA: Diagnosis not present

## 2020-07-17 DIAGNOSIS — M6281 Muscle weakness (generalized): Secondary | ICD-10-CM | POA: Diagnosis not present

## 2020-07-17 DIAGNOSIS — R262 Difficulty in walking, not elsewhere classified: Secondary | ICD-10-CM | POA: Diagnosis not present

## 2020-07-21 ENCOUNTER — Other Ambulatory Visit: Payer: Self-pay

## 2020-07-21 ENCOUNTER — Telehealth: Payer: Self-pay | Admitting: Hematology and Oncology

## 2020-07-21 ENCOUNTER — Encounter: Payer: Self-pay | Admitting: Hematology and Oncology

## 2020-07-21 ENCOUNTER — Inpatient Hospital Stay: Payer: Medicare Other

## 2020-07-21 ENCOUNTER — Inpatient Hospital Stay (HOSPITAL_BASED_OUTPATIENT_CLINIC_OR_DEPARTMENT_OTHER): Payer: Medicare Other | Admitting: Hematology and Oncology

## 2020-07-21 ENCOUNTER — Inpatient Hospital Stay: Payer: Medicare Other | Attending: Hematology and Oncology

## 2020-07-21 VITALS — BP 138/51 | HR 70 | Temp 97.0°F | Resp 18 | Ht 62.0 in | Wt 251.3 lb

## 2020-07-21 DIAGNOSIS — C9110 Chronic lymphocytic leukemia of B-cell type not having achieved remission: Secondary | ICD-10-CM | POA: Diagnosis not present

## 2020-07-21 DIAGNOSIS — R059 Cough, unspecified: Secondary | ICD-10-CM | POA: Insufficient documentation

## 2020-07-21 DIAGNOSIS — Z23 Encounter for immunization: Secondary | ICD-10-CM | POA: Insufficient documentation

## 2020-07-21 DIAGNOSIS — D696 Thrombocytopenia, unspecified: Secondary | ICD-10-CM

## 2020-07-21 DIAGNOSIS — N183 Chronic kidney disease, stage 3 unspecified: Secondary | ICD-10-CM | POA: Diagnosis not present

## 2020-07-21 DIAGNOSIS — E538 Deficiency of other specified B group vitamins: Secondary | ICD-10-CM | POA: Diagnosis not present

## 2020-07-21 DIAGNOSIS — Z5111 Encounter for antineoplastic chemotherapy: Secondary | ICD-10-CM | POA: Insufficient documentation

## 2020-07-21 LAB — CBC WITH DIFFERENTIAL (CANCER CENTER ONLY)
Abs Immature Granulocytes: 0 10*3/uL (ref 0.00–0.07)
Basophils Absolute: 0.2 10*3/uL — ABNORMAL HIGH (ref 0.0–0.1)
Basophils Relative: 1 %
Eosinophils Absolute: 0.5 10*3/uL (ref 0.0–0.5)
Eosinophils Relative: 2 %
HCT: 37.5 % (ref 36.0–46.0)
Hemoglobin: 12 g/dL (ref 12.0–15.0)
Lymphocytes Relative: 75 %
Lymphs Abs: 18.4 10*3/uL — ABNORMAL HIGH (ref 0.7–4.0)
MCH: 27.6 pg (ref 26.0–34.0)
MCHC: 32 g/dL (ref 30.0–36.0)
MCV: 86.4 fL (ref 80.0–100.0)
Monocytes Absolute: 0.2 10*3/uL (ref 0.1–1.0)
Monocytes Relative: 1 %
Neutro Abs: 5.1 10*3/uL (ref 1.7–7.7)
Neutrophils Relative %: 21 %
Platelet Count: 125 10*3/uL — ABNORMAL LOW (ref 150–400)
RBC: 4.34 MIL/uL (ref 3.87–5.11)
RDW: 15.4 % (ref 11.5–15.5)
WBC Count: 24.5 10*3/uL — ABNORMAL HIGH (ref 4.0–10.5)
nRBC: 0 % (ref 0.0–0.2)

## 2020-07-21 LAB — CMP (CANCER CENTER ONLY)
ALT: 14 U/L (ref 0–44)
AST: 19 U/L (ref 15–41)
Albumin: 3.8 g/dL (ref 3.5–5.0)
Alkaline Phosphatase: 83 U/L (ref 38–126)
Anion gap: 8 (ref 5–15)
BUN: 26 mg/dL — ABNORMAL HIGH (ref 8–23)
CO2: 29 mmol/L (ref 22–32)
Calcium: 9.5 mg/dL (ref 8.9–10.3)
Chloride: 105 mmol/L (ref 98–111)
Creatinine: 1.26 mg/dL — ABNORMAL HIGH (ref 0.44–1.00)
GFR, Est AFR Am: 49 mL/min — ABNORMAL LOW (ref >60)
GFR, Estimated: 42 mL/min — ABNORMAL LOW (ref >60)
Glucose, Bld: 102 mg/dL — ABNORMAL HIGH (ref 70–99)
Potassium: 4.2 mmol/L (ref 3.5–5.1)
Sodium: 142 mmol/L (ref 135–145)
Total Bilirubin: 0.5 mg/dL (ref 0.3–1.2)
Total Protein: 6.1 g/dL — ABNORMAL LOW (ref 6.5–8.1)

## 2020-07-21 MED ORDER — INFLUENZA VAC A&B SA ADJ QUAD 0.5 ML IM PRSY
0.5000 mL | PREFILLED_SYRINGE | Freq: Once | INTRAMUSCULAR | Status: AC
  Administered 2020-07-21: 0.5 mL via INTRAMUSCULAR

## 2020-07-21 MED ORDER — INFLUENZA VAC A&B SA ADJ QUAD 0.5 ML IM PRSY
PREFILLED_SYRINGE | INTRAMUSCULAR | Status: AC
  Filled 2020-07-21: qty 0.5

## 2020-07-21 MED ORDER — CYANOCOBALAMIN 1000 MCG/ML IJ SOLN
INTRAMUSCULAR | Status: AC
  Filled 2020-07-21: qty 1

## 2020-07-21 MED ORDER — SODIUM CHLORIDE 0.9% FLUSH
10.0000 mL | INTRAVENOUS | Status: DC | PRN
  Administered 2020-07-21: 10 mL
  Filled 2020-07-21: qty 10

## 2020-07-21 MED ORDER — PALONOSETRON HCL INJECTION 0.25 MG/5ML
INTRAVENOUS | Status: AC
  Filled 2020-07-21: qty 5

## 2020-07-21 MED ORDER — CYANOCOBALAMIN 1000 MCG/ML IJ SOLN
1000.0000 ug | Freq: Once | INTRAMUSCULAR | Status: AC
  Administered 2020-07-21: 1000 ug via INTRAMUSCULAR

## 2020-07-21 MED ORDER — SODIUM CHLORIDE 0.9 % IV SOLN
Freq: Once | INTRAVENOUS | Status: AC
  Filled 2020-07-21: qty 250

## 2020-07-21 MED ORDER — HEPARIN SOD (PORK) LOCK FLUSH 100 UNIT/ML IV SOLN
500.0000 [IU] | Freq: Once | INTRAVENOUS | Status: AC | PRN
  Administered 2020-07-21: 500 [IU]
  Filled 2020-07-21: qty 5

## 2020-07-21 MED ORDER — SODIUM CHLORIDE 0.9 % IV SOLN
56.0000 mg/m2 | Freq: Once | INTRAVENOUS | Status: AC
  Administered 2020-07-21: 100 mg via INTRAVENOUS
  Filled 2020-07-21: qty 4

## 2020-07-21 MED ORDER — PALONOSETRON HCL INJECTION 0.25 MG/5ML
0.2500 mg | Freq: Once | INTRAVENOUS | Status: AC
  Administered 2020-07-21: 0.25 mg via INTRAVENOUS

## 2020-07-21 MED ORDER — SODIUM CHLORIDE 0.9% FLUSH
10.0000 mL | Freq: Once | INTRAVENOUS | Status: AC
  Administered 2020-07-21: 10 mL
  Filled 2020-07-21: qty 10

## 2020-07-21 MED ORDER — SODIUM CHLORIDE 0.9 % IV SOLN
10.0000 mg | Freq: Once | INTRAVENOUS | Status: AC
  Administered 2020-07-21: 10 mg via INTRAVENOUS
  Filled 2020-07-21: qty 10

## 2020-07-21 NOTE — Telephone Encounter (Signed)
Scheduled per 10/04 scheduled message, patient will be notified of upcoming appointment.

## 2020-07-21 NOTE — Assessment & Plan Note (Signed)
She has positive response to treatment She is taking slightly more time per month to recover from side effects of treatment I plan to order CT imaging at the end of the month for objective assessment of response to therapy

## 2020-07-21 NOTE — Progress Notes (Signed)
[facility] OFFICE PROGRESS NOTE  Patient Care Team: Jonathon Jordan, MD as PCP - General (Family Medicine) Heath Lark, MD as Referring Physician (Hematology and Oncology)  ASSESSMENT & PLAN:  CLL (chronic lymphocytic leukemia) ([place]) She has positive response to treatment She is taking slightly more time per month to recover from side effects of treatment I plan to order CT imaging at the end of the month for objective assessment of response to therapy  Thrombocytopenia, unspecified ([place]) This is due to side effects of treatment She has some bruises She will continue treatment as scheduled  CKD (chronic kidney disease), symptom management only, stage 3 (moderate) Her renal function has improved/stable Continue close observation  Cough She has chronic cough Her primary care doctor felt this is related to allergies We discussed potential referral to pulmonologist or allergist but her sister felt this is not necessary at this point  Vitamin B12 deficiency She has intermittent pancytopenia which is multifactorial as well as vitamin B12 deficiency She will get vitamin B12 injection monthly   Orders Placed This Encounter  Procedures  . CT CHEST ABDOMEN PELVIS W CONTRAST    Standing Status:   Future    Standing Expiration Date:   07/21/2021    Order Specific Question:   Preferred imaging location?    Answer:   [facility]    Order Specific Question:   Radiology Contrast Protocol - do NOT remove file path    Answer:   [email or web address]    All questions were answered. The patient knows to call the clinic with any problems, questions or concerns. The total time spent in the appointment was 30 minutes encounter with patients including review of chart and various tests results, discussions about plan of care and coordination of care plan   Heath Lark, MD 07/21/2020 11:57 AM  INTERVAL HISTORY: Please see below for  problem oriented charting. She returns with her sister for further follow-up She continues to have intermittent bruising No recent infection, fever or chills She has mild chronic cough Appetite is fair She continues to have progressive weight gain while on treatment Denies recent nausea or constipation from treatment  SUMMARY OF ONCOLOGIC HISTORY: Oncology History Overview Note  FISH del 13 q   CLL (chronic lymphocytic leukemia) ([place])  07/02/2010 Procedure   LN biopsy confirmed CLL   08/18/2010 Procedure   Bone marrow biopsy confirmed CLL   11/18/2010 - 04/15/2011 Chemotherapy   patient received 6 cycles of FLudarabine and Rituximab   01/13/2012 Relapse/Recurrence   Repeat BM biopsy confirmed relapse   01/27/2012 - 06/23/2012 Chemotherapy   Bendamustine & Rituximab given, complicated by infusion reaction to Rituximab, completed 6 cycles   06/29/2013 Imaging   Ct scan confirmed disease relapse with bulky lymphadenopathy   07/23/2013 Procedure   Repeat BM biopsy due to thrombocytopenia and progressive leukocytosis   08/02/2013 Relapse/Recurrence   Patient consented to start iburitinib as salvage therapy for relapsed CLL   11/09/2013 Imaging   Repeat CT scan the chest, abdomen and pelvis showed greater than 50% response to treatment   06/17/2014 Imaging   Repeat CT scan showed near complete response to treatment.   05/30/2015 Imaging   Repeat CT scan showed near complete response to treatment.   06/14/2016 Imaging   CT scan showed stable scattered small retroperitoneal lymph nodes and pelvic lymph nodes. No findings for recurrent or progressive lymphoma. Stable mild splenomegaly.   12/30/2017 Imaging   1. Recurrent adenopathy within the chest, abdomen, and  pelvis. 2. Splenomegaly. 3. Aortic Atherosclerosis (ICD10-I70.0). 4. Coronary artery calcifications.   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 The catheter is ready for immediate use.   01/24/2018 -  Chemotherapy   The patient had Gazyva and 1 dose of bendamustine. Bendamustine was discontinued due to inability to get insurance approval   05/08/2018 Imaging   1. Interval partial treatment response. Bilateral axillary, mediastinal, right hilar, retroperitoneal and bilateral pelvic adenopathy is all decreased. Mild splenomegaly, decreased. No new or progressive disease. 2. Aortic Atherosclerosis (ICD10-I70.0).   01/31/2020 Imaging   1. Since 12/29/2017, marked progression of adenopathy within the chest, abdomen, and pelvis as detailed above. 2. Progressive splenomegaly, consistent with splenic involvement. 3. New pulmonary parenchymal findings which could represent interval infection (including atypical etiologies) or aspiration. 4. Coronary artery atherosclerosis. Aortic Atherosclerosis (ICD10-I70.0).   02/07/2020 -  Chemotherapy   The patient had palonosetron (ALOXI) injection 0.25 mg, 0.25 mg, Intravenous,  Once, 5 of 6 cycles Administration: 0.25 mg (02/07/2020), 0.25 mg (03/04/2020), 0.25 mg (04/01/2020), 0.25 mg (05/05/2020), 0.25 mg (06/09/2020) bendamustine (BENDEKA) 125 mg in sodium chloride 0.9 % 50 mL (2.2727 mg/mL) chemo infusion, 70 mg/m2 = 125 mg (100 % of original dose 70 mg/m2), Intravenous,  Once, 5 of 6 cycles Dose modification: 70 mg/m2 (original dose 70 mg/m2, Cycle 1, Reason: Provider Judgment), 56 mg/m2 (80 % of original dose 70 mg/m2, Cycle 3, Reason: Dose Not Tolerated) Administration: 125 mg (02/07/2020), 125 mg (02/08/2020), 125 mg (03/04/2020), 125 mg (03/05/2020), 100 mg (04/01/2020), 100 mg (04/02/2020), 100 mg (05/05/2020), 100 mg (05/06/2020), 100 mg (06/09/2020), 100 mg (06/10/2020)  for chemotherapy treatment.    04/28/2020 Imaging   Chest Impression:   1. New consolidation in the LEFT upper lobe is concerning for pneumonia versus lymphoma.  Recommend clinical correlation with pulmonary infection. Favor recurrent pulmonary infection in light of RIGHT lung infection demonstrated on CT January 31, 2020. 2. Resolution of ground-glass densities in the RIGHT upper lobe and consolidation in the RIGHT lower lobe consistent resolved pulmonary infection. 3. Persistent enlarged axillary, mediastinal, and hilar lymph nodes.  Overall adenopathy is mildly improved.   Abdomen / Pelvis Impression:   1. Improved bulky retroperitoneal and iliac lymphadenopathy. The large lymph nodes are measurably decreased in size although remain bulky. 2. Marked splenomegaly also improved.     REVIEW OF SYSTEMS:   Constitutional: Denies fevers, chills or abnormal weight loss Eyes: Denies blurriness of vision Ears, nose, mouth, throat, and face: Denies mucositis or sore throat Respiratory: Denies cough, dyspnea or wheezes Cardiovascular: Denies palpitation, chest discomfort or lower extremity swelling Gastrointestinal:  Denies nausea, heartburn or change in bowel habits Skin: Denies abnormal skin rashes Lymphatics: Denies new lymphadenopathy Neurological:Denies numbness, tingling or new weaknesses Behavioral/Psych: Mood is stable, no new changes  All other systems were reviewed with the patient and are negative.  I have reviewed the past medical history, past surgical history, social history and family history with the patient and they are unchanged from previous note.  ALLERGIES:  is allergic to azithromycin and ibuprofen.  MEDICATIONS:  Current Outpatient Medications  Medication Sig Dispense Refill  . acetaminophen (TYLENOL) 325 MG tablet Take 650 mg by mouth every 6 (six) hours as needed.    Marland Kitchen acyclovir (ZOVIRAX) 400 MG tablet Take 1 tablet (400 mg total) by mouth daily. 30 tablet 5  .  albuterol (VENTOLIN HFA) 108 (90 Base) MCG/ACT inhaler Inhale 2 puffs into the lungs in the morning and at bedtime.    Marland Kitchen allopurinol (ZYLOPRIM) 300 MG tablet Take 1  tablet (300 mg total) by mouth daily. 30 tablet 0  . amLODipine (NORVASC) 10 MG tablet TAKE 1 TABLET (10 MG TOTAL) BY MOUTH DAILY. 90 tablet 1  . amoxicillin-clavulanate (AUGMENTIN) 875-125 MG tablet Take 1 tablet by mouth 2 (two) times daily. 14 tablet 0  . azelastine (ASTELIN) 0.1 % nasal spray Place 1 spray into both nostrils 2 (two) times daily. Use in each nostril as directed    . bisoprolol (ZEBETA) 5 MG tablet Take 10 mg by mouth daily.   10  . Carboxymethylcellul-Glycerin (REFRESH OPTIVE SENSITIVE OP) Place 1 drop into both eyes as needed.     . citalopram (CELEXA) 20 MG tablet Take 20 mg by mouth every morning.     . fluticasone-salmeterol (ADVAIR HFA) 115-21 MCG/ACT inhaler Inhale 2 puffs into the lungs 2 (two) times daily.    Marland Kitchen gabapentin (NEURONTIN) 300 MG capsule Take 300 mg by mouth at bedtime.    . hydrochlorothiazide (HYDRODIURIL) 25 MG tablet Take 25 mg by mouth daily.    Marland Kitchen levocetirizine (XYZAL) 5 MG tablet Take 5 mg by mouth every evening.    . lidocaine-prilocaine (EMLA) cream Apply to affected area once 30 g 5  . losartan-hydrochlorothiazide (HYZAAR) 100-25 MG tablet Take by mouth daily.   9  . Multiple Vitamins-Minerals (PRESERVISION AREDS 2) CAPS Take 1 capsule by mouth 2 (two) times daily.    . ondansetron (ZOFRAN) 8 MG tablet Take 1 tablet (8 mg total) by mouth 2 (two) times daily. Start second day after chemotherapy. Then take as needed for nausea or vomiting. 30 tablet 1  . predniSONE (DELTASONE) 20 MG tablet Take 3 tablets (60 mg total) by mouth daily with breakfast. 21 tablet 0  . prochlorperazine (COMPAZINE) 10 MG tablet Take 1 tablet (10 mg total) by mouth every 6 (six) hours as needed (Nausea or vomiting). 30 tablet 1  . sulfamethoxazole-trimethoprim (BACTRIM DS) 800-160 MG tablet Take 1 tablet by mouth 3 (three) times a week. 12 tablet 5  . triamcinolone (NASACORT) 55 MCG/ACT AERO nasal inhaler Place 2 sprays into the nose daily. 1 Inhaler 12  . Vitamin D,  Ergocalciferol, 2000 units CAPS Take by mouth.     No current facility-administered medications for this visit.    PHYSICAL EXAMINATION: ECOG PERFORMANCE STATUS: 1 - Symptomatic but completely ambulatory  Vitals:   07/21/20 1124  BP: (!) 138/51  Pulse: 70  Resp: 18  Temp: (!) 97 F (36.1 C)  SpO2: 95%   Filed Weights   07/21/20 1124  Weight: 251 lb 4.8 oz (114 kg)    GENERAL:alert, no distress and comfortable.  Limited examination due to class III obesity SKIN: Noted some skin bruises EYES: normal, Conjunctiva are pink and non-injected, sclera clear OROPHARYNX:no exudate, no erythema and lips, buccal mucosa, and tongue normal  NECK: supple, thyroid normal size, non-tender, without nodularity LYMPH:  no palpable lymphadenopathy in the cervical, axillary or inguinal LUNGS: clear to auscultation and percussion with normal breathing effort HEART: regular rate & rhythm and no murmurs and no lower extremity edema ABDOMEN:abdomen soft, non-tender and normal bowel sounds Musculoskeletal:no cyanosis of digits and no clubbing  NEURO: alert & oriented x 3 with fluent speech, no focal motor/sensory deficits  LABORATORY DATA:  I have reviewed the data as listed    Component Value  Date/Time   NA 142 07/21/2020 1120   NA 141 08/23/2017 1026   K 4.2 07/21/2020 1120   K 3.7 08/23/2017 1026   CL 105 07/21/2020 1120   CL 98 02/02/2013 1353   CO2 29 07/21/2020 1120   CO2 33 (H) 08/23/2017 1026   GLUCOSE 102 (H) 07/21/2020 1120   GLUCOSE 92 08/23/2017 1026   GLUCOSE 99 02/02/2013 1353   BUN 26 (H) 07/21/2020 1120   BUN 21.0 08/23/2017 1026   CREATININE 1.26 (H) 07/21/2020 1120   CREATININE 1.2 (H) 08/23/2017 1026   CALCIUM 9.5 07/21/2020 1120   CALCIUM 9.3 08/23/2017 1026   PROT 6.1 (L) 07/21/2020 1120   PROT 5.9 (L) 03/28/2020 1044   PROT 6.3 (L) 08/23/2017 1026   ALBUMIN 3.8 07/21/2020 1120   ALBUMIN 3.8 08/23/2017 1026   AST 19 07/21/2020 1120   AST 11 08/23/2017 1026    ALT 14 07/21/2020 1120   ALT 10 08/23/2017 1026   ALKPHOS 83 07/21/2020 1120   ALKPHOS 66 08/23/2017 1026   BILITOT 0.5 07/21/2020 1120   BILITOT 0.59 08/23/2017 1026   GFRNONAA 42 (L) 07/21/2020 1120   GFRAA 49 (L) 07/21/2020 1120    No results found for: SPEP, UPEP  Lab Results  Component Value Date   WBC 24.5 (H) 07/21/2020   NEUTROABS 5.1 07/21/2020   HGB 12.0 07/21/2020   HCT 37.5 07/21/2020   MCV 86.4 07/21/2020   PLT 125 (L) 07/21/2020      Chemistry      Component Value Date/Time   NA 142 07/21/2020 1120   NA 141 08/23/2017 1026   K 4.2 07/21/2020 1120   K 3.7 08/23/2017 1026   CL 105 07/21/2020 1120   CL 98 02/02/2013 1353   CO2 29 07/21/2020 1120   CO2 33 (H) 08/23/2017 1026   BUN 26 (H) 07/21/2020 1120   BUN 21.0 08/23/2017 1026   CREATININE 1.26 (H) 07/21/2020 1120   CREATININE 1.2 (H) 08/23/2017 1026      Component Value Date/Time   CALCIUM 9.5 07/21/2020 1120   CALCIUM 9.3 08/23/2017 1026   ALKPHOS 83 07/21/2020 1120   ALKPHOS 66 08/23/2017 1026   AST 19 07/21/2020 1120   AST 11 08/23/2017 1026   ALT 14 07/21/2020 1120   ALT 10 08/23/2017 1026   BILITOT 0.5 07/21/2020 1120   BILITOT 0.59 08/23/2017 1026

## 2020-07-21 NOTE — Assessment & Plan Note (Signed)
Her renal function has improved/stable Continue close observation 

## 2020-07-21 NOTE — Assessment & Plan Note (Signed)
She has chronic cough Her primary care doctor felt this is related to allergies We discussed potential referral to pulmonologist or allergist but her sister felt this is not necessary at this point

## 2020-07-21 NOTE — Assessment & Plan Note (Signed)
She has intermittent pancytopenia which is multifactorial as well as vitamin B12 deficiency She will get vitamin B12 injection monthly

## 2020-07-21 NOTE — Patient Instructions (Signed)

## 2020-07-21 NOTE — Assessment & Plan Note (Signed)
This is due to side effects of treatment She has some bruises She will continue treatment as scheduled

## 2020-07-21 NOTE — Patient Instructions (Signed)
[facility] Discharge Instructions for Patients Receiving Chemotherapy  Today you received the following chemotherapy agents: bendamustine.  To help prevent nausea and vomiting after your treatment, we encourage you to take your nausea medication as directed.   If you develop nausea and vomiting that is not controlled by your nausea medication, call the clinic.   BELOW ARE SYMPTOMS THAT SHOULD BE REPORTED IMMEDIATELY:  *FEVER GREATER THAN 100.5 F  *CHILLS WITH OR WITHOUT FEVER  NAUSEA AND VOMITING THAT IS NOT CONTROLLED WITH YOUR NAUSEA MEDICATION  *UNUSUAL SHORTNESS OF BREATH  *UNUSUAL BRUISING OR BLEEDING  TENDERNESS IN MOUTH AND THROAT WITH OR WITHOUT PRESENCE OF ULCERS  *URINARY PROBLEMS  *BOWEL PROBLEMS  UNUSUAL RASH Items with * indicate a potential emergency and should be followed up as soon as possible.  Feel free to call the clinic should you have any questions or concerns. The clinic phone number is [phone].  Please show the CHEMO ALERT CARD at check-in to the Emergency Department and triage nurse.   

## 2020-07-22 ENCOUNTER — Other Ambulatory Visit: Payer: Self-pay

## 2020-07-22 ENCOUNTER — Inpatient Hospital Stay: Payer: Medicare Other

## 2020-07-22 VITALS — BP 117/49 | HR 70 | Temp 98.5°F | Resp 20

## 2020-07-22 DIAGNOSIS — C9110 Chronic lymphocytic leukemia of B-cell type not having achieved remission: Secondary | ICD-10-CM | POA: Diagnosis not present

## 2020-07-22 DIAGNOSIS — R059 Cough, unspecified: Secondary | ICD-10-CM | POA: Diagnosis not present

## 2020-07-22 DIAGNOSIS — E538 Deficiency of other specified B group vitamins: Secondary | ICD-10-CM | POA: Diagnosis not present

## 2020-07-22 DIAGNOSIS — Z23 Encounter for immunization: Secondary | ICD-10-CM | POA: Diagnosis not present

## 2020-07-22 DIAGNOSIS — Z5111 Encounter for antineoplastic chemotherapy: Secondary | ICD-10-CM | POA: Diagnosis not present

## 2020-07-22 MED ORDER — SODIUM CHLORIDE 0.9 % IV SOLN
10.0000 mg | Freq: Once | INTRAVENOUS | Status: AC
  Administered 2020-07-22: 10 mg via INTRAVENOUS
  Filled 2020-07-22: qty 10

## 2020-07-22 MED ORDER — HEPARIN SOD (PORK) LOCK FLUSH 100 UNIT/ML IV SOLN
500.0000 [IU] | Freq: Once | INTRAVENOUS | Status: AC | PRN
  Administered 2020-07-22: 500 [IU]
  Filled 2020-07-22: qty 5

## 2020-07-22 MED ORDER — SODIUM CHLORIDE 0.9 % IV SOLN
Freq: Once | INTRAVENOUS | Status: AC
  Filled 2020-07-22: qty 250

## 2020-07-22 MED ORDER — SODIUM CHLORIDE 0.9% FLUSH
10.0000 mL | INTRAVENOUS | Status: DC | PRN
  Administered 2020-07-22: 10 mL
  Filled 2020-07-22: qty 10

## 2020-07-22 MED ORDER — SODIUM CHLORIDE 0.9 % IV SOLN
56.0000 mg/m2 | Freq: Once | INTRAVENOUS | Status: AC
  Administered 2020-07-22: 100 mg via INTRAVENOUS
  Filled 2020-07-22: qty 4

## 2020-07-22 NOTE — Patient Instructions (Signed)
[facility] Discharge Instructions for Patients Receiving Chemotherapy  Today you received the following chemotherapy agents Bendeka.  To help prevent nausea and vomiting after your treatment, we encourage you to take your nausea medication as directed.   If you develop nausea and vomiting that is not controlled by your nausea medication, call the clinic.   BELOW ARE SYMPTOMS THAT SHOULD BE REPORTED IMMEDIATELY:  *FEVER GREATER THAN 100.5 F  *CHILLS WITH OR WITHOUT FEVER  NAUSEA AND VOMITING THAT IS NOT CONTROLLED WITH YOUR NAUSEA MEDICATION  *UNUSUAL SHORTNESS OF BREATH  *UNUSUAL BRUISING OR BLEEDING  TENDERNESS IN MOUTH AND THROAT WITH OR WITHOUT PRESENCE OF ULCERS  *URINARY PROBLEMS  *BOWEL PROBLEMS  UNUSUAL RASH Items with * indicate a potential emergency and should be followed up as soon as possible.  Feel free to call the clinic should you have any questions or concerns. The clinic phone number is [phone].  Please show the CHEMO ALERT CARD at check-in to the Emergency Department and triage nurse.   

## 2020-07-23 DIAGNOSIS — R262 Difficulty in walking, not elsewhere classified: Secondary | ICD-10-CM | POA: Diagnosis not present

## 2020-07-23 DIAGNOSIS — M6281 Muscle weakness (generalized): Secondary | ICD-10-CM | POA: Diagnosis not present

## 2020-07-23 DIAGNOSIS — R2681 Unsteadiness on feet: Secondary | ICD-10-CM | POA: Diagnosis not present

## 2020-07-24 ENCOUNTER — Other Ambulatory Visit: Payer: Self-pay | Admitting: Family Medicine

## 2020-07-24 DIAGNOSIS — Z1231 Encounter for screening mammogram for malignant neoplasm of breast: Secondary | ICD-10-CM

## 2020-07-24 DIAGNOSIS — R262 Difficulty in walking, not elsewhere classified: Secondary | ICD-10-CM | POA: Diagnosis not present

## 2020-07-24 DIAGNOSIS — R2681 Unsteadiness on feet: Secondary | ICD-10-CM | POA: Diagnosis not present

## 2020-07-24 DIAGNOSIS — M6281 Muscle weakness (generalized): Secondary | ICD-10-CM | POA: Diagnosis not present

## 2020-07-25 DIAGNOSIS — R262 Difficulty in walking, not elsewhere classified: Secondary | ICD-10-CM | POA: Diagnosis not present

## 2020-07-25 DIAGNOSIS — M6281 Muscle weakness (generalized): Secondary | ICD-10-CM | POA: Diagnosis not present

## 2020-07-25 DIAGNOSIS — R2681 Unsteadiness on feet: Secondary | ICD-10-CM | POA: Diagnosis not present

## 2020-07-28 DIAGNOSIS — M6281 Muscle weakness (generalized): Secondary | ICD-10-CM | POA: Diagnosis not present

## 2020-07-28 DIAGNOSIS — R2681 Unsteadiness on feet: Secondary | ICD-10-CM | POA: Diagnosis not present

## 2020-07-28 DIAGNOSIS — R262 Difficulty in walking, not elsewhere classified: Secondary | ICD-10-CM | POA: Diagnosis not present

## 2020-07-29 DIAGNOSIS — M6281 Muscle weakness (generalized): Secondary | ICD-10-CM | POA: Diagnosis not present

## 2020-07-29 DIAGNOSIS — R2681 Unsteadiness on feet: Secondary | ICD-10-CM | POA: Diagnosis not present

## 2020-07-29 DIAGNOSIS — R262 Difficulty in walking, not elsewhere classified: Secondary | ICD-10-CM | POA: Diagnosis not present

## 2020-07-31 DIAGNOSIS — R262 Difficulty in walking, not elsewhere classified: Secondary | ICD-10-CM | POA: Diagnosis not present

## 2020-07-31 DIAGNOSIS — M6281 Muscle weakness (generalized): Secondary | ICD-10-CM | POA: Diagnosis not present

## 2020-07-31 DIAGNOSIS — R2681 Unsteadiness on feet: Secondary | ICD-10-CM | POA: Diagnosis not present

## 2020-08-04 DIAGNOSIS — R2681 Unsteadiness on feet: Secondary | ICD-10-CM | POA: Diagnosis not present

## 2020-08-04 DIAGNOSIS — R262 Difficulty in walking, not elsewhere classified: Secondary | ICD-10-CM | POA: Diagnosis not present

## 2020-08-04 DIAGNOSIS — M6281 Muscle weakness (generalized): Secondary | ICD-10-CM | POA: Diagnosis not present

## 2020-08-05 DIAGNOSIS — M6281 Muscle weakness (generalized): Secondary | ICD-10-CM | POA: Diagnosis not present

## 2020-08-05 DIAGNOSIS — R2681 Unsteadiness on feet: Secondary | ICD-10-CM | POA: Diagnosis not present

## 2020-08-05 DIAGNOSIS — R262 Difficulty in walking, not elsewhere classified: Secondary | ICD-10-CM | POA: Diagnosis not present

## 2020-08-07 DIAGNOSIS — R2681 Unsteadiness on feet: Secondary | ICD-10-CM | POA: Diagnosis not present

## 2020-08-07 DIAGNOSIS — M6281 Muscle weakness (generalized): Secondary | ICD-10-CM | POA: Diagnosis not present

## 2020-08-07 DIAGNOSIS — R262 Difficulty in walking, not elsewhere classified: Secondary | ICD-10-CM | POA: Diagnosis not present

## 2020-08-11 ENCOUNTER — Ambulatory Visit (HOSPITAL_COMMUNITY)
Admission: RE | Admit: 2020-08-11 | Discharge: 2020-08-11 | Disposition: A | Discharge status: Home / Self Care | Payer: Medicare Other | Source: Ambulatory Visit | Attending: Hematology and Oncology | Admitting: Hematology and Oncology

## 2020-08-11 ENCOUNTER — Encounter (HOSPITAL_COMMUNITY): Payer: Self-pay

## 2020-08-11 ENCOUNTER — Other Ambulatory Visit: Payer: Self-pay

## 2020-08-11 DIAGNOSIS — C9110 Chronic lymphocytic leukemia of B-cell type not having achieved remission: Secondary | ICD-10-CM | POA: Diagnosis not present

## 2020-08-11 DIAGNOSIS — I700 Atherosclerosis of aorta: Secondary | ICD-10-CM | POA: Diagnosis not present

## 2020-08-11 DIAGNOSIS — R590 Localized enlarged lymph nodes: Secondary | ICD-10-CM | POA: Diagnosis not present

## 2020-08-11 DIAGNOSIS — R059 Cough, unspecified: Secondary | ICD-10-CM | POA: Diagnosis not present

## 2020-08-11 DIAGNOSIS — I2510 Atherosclerotic heart disease of native coronary artery without angina pectoris: Secondary | ICD-10-CM | POA: Diagnosis not present

## 2020-08-11 MED ORDER — IOHEXOL 300 MG/ML  SOLN
100.0000 mL | Freq: Once | INTRAMUSCULAR | Status: AC | PRN
  Administered 2020-08-11: 80 mL via INTRAVENOUS

## 2020-08-12 ENCOUNTER — Encounter: Payer: Self-pay | Admitting: Hematology and Oncology

## 2020-08-12 ENCOUNTER — Inpatient Hospital Stay: Payer: Medicare Other | Admitting: Hematology and Oncology

## 2020-08-12 ENCOUNTER — Other Ambulatory Visit: Payer: Self-pay

## 2020-08-12 ENCOUNTER — Telehealth: Payer: Self-pay

## 2020-08-12 ENCOUNTER — Telehealth: Payer: Self-pay | Admitting: Pharmacist

## 2020-08-12 VITALS — BP 137/59 | HR 74 | Temp 98.5°F | Resp 18 | Ht 62.0 in | Wt 245.8 lb

## 2020-08-12 DIAGNOSIS — J849 Interstitial pulmonary disease, unspecified: Secondary | ICD-10-CM

## 2020-08-12 DIAGNOSIS — R059 Cough, unspecified: Secondary | ICD-10-CM | POA: Diagnosis not present

## 2020-08-12 DIAGNOSIS — Z7189 Other specified counseling: Secondary | ICD-10-CM

## 2020-08-12 DIAGNOSIS — C9110 Chronic lymphocytic leukemia of B-cell type not having achieved remission: Secondary | ICD-10-CM

## 2020-08-12 DIAGNOSIS — R2681 Unsteadiness on feet: Secondary | ICD-10-CM | POA: Diagnosis not present

## 2020-08-12 DIAGNOSIS — E538 Deficiency of other specified B group vitamins: Secondary | ICD-10-CM | POA: Diagnosis not present

## 2020-08-12 DIAGNOSIS — R262 Difficulty in walking, not elsewhere classified: Secondary | ICD-10-CM | POA: Diagnosis not present

## 2020-08-12 DIAGNOSIS — Z23 Encounter for immunization: Secondary | ICD-10-CM | POA: Diagnosis not present

## 2020-08-12 DIAGNOSIS — M6281 Muscle weakness (generalized): Secondary | ICD-10-CM | POA: Diagnosis not present

## 2020-08-12 DIAGNOSIS — Z5111 Encounter for antineoplastic chemotherapy: Secondary | ICD-10-CM | POA: Diagnosis not present

## 2020-08-12 MED ORDER — ACALABRUTINIB 100 MG PO CAPS: 100.0000 mg | ORAL_CAPSULE | Freq: Two times a day (BID) | ORAL | 11 refills | Status: DC

## 2020-08-12 MED ORDER — AMOXICILLIN-POT CLAVULANATE 875-125 MG PO TABS
1.0000 | ORAL_TABLET | Freq: Two times a day (BID) | ORAL | 0 refills | Status: DC
Start: 2020-08-12 — End: 2020-09-08

## 2020-08-12 NOTE — Assessment & Plan Note (Signed)
I have extensive goals of care with the patient and her caregiver Due to her mental deficit, she is deemed not able to make informed decision on her own Her caregivers will have to make medical decisions for her; her sister, [name] is a healthcare power of attorney  due to multiple relapses, the treatment is considered palliative in nature

## 2020-08-12 NOTE — Telephone Encounter (Signed)
Oral Oncology Patient Advocate Encounter  Received notification from [place] [facility] that prior authorization for Calquence is required.  PA submitted on CoverMyMeds Key [id number] Status is pending  Oral Oncology Clinic will continue to follow.  [place] Patient [place] Phone [phone] Fax [phone] 08/12/2020 4:15 PM

## 2020-08-12 NOTE — Assessment & Plan Note (Signed)
Unfortunately, CT imaging show disease progression She is not symptomatic Insurance company have declined paying for obinutuzumab At this point in time, I recommend discontinuation of bendamustine We reviewed the current guidelines Current Category 1 recommendation for treatment of CLL is Acalabrutinib  This is based on the following publication. Acalabrutinib (ACP-196) in Relapsed Chronic Lymphocytic Leukemia by Patton Salles al  This article was published on September 23, 2014, at [email or web address] N Engl J Med 1696;789:381-01. DOI: 10.1056/NEJMoa1509981   BACKGROUND  Irreversible inhibition of Bruton's tyrosine kinase (BTK) by ibrutinib represents an important therapeutic advance for the treatment of chronic lymphocytic leukemia (CLL). However, ibrutinib also irreversibly inhibits alternative kinase targets, which potentially compromises its therapeutic index. Acalabrutinib (ACP-196) is a more selective, irreversible BTK inhibitor that is specifically designed to improve on the safety and efficacy of first-generation BTK inhibitors.  METHODS  In this uncontrolled, phase 1-2, multicenter study, we administered oral acalabrutinib to 61 patients who had relapsed CLL to assess the safety, efficacy, pharmacokinetics, and pharmacodynamics of acalabrutinib. Patients were treated with acalabrutinib at a dose of 100 to 400 mg once daily in the dose-escalation (phase 1) portion of the study and 100 mg twice daily in the expansion (phase 2) portion.  RESULTS  The median age of the patients was 64 years, and patients had received a median of three previous therapies for CLL; 31% had chromosome 17p13.1 deletion, and 75% had unmutated immunoglobulin heavy-chain variable genes. No dose-limiting toxic effects occurred during the dose-escalation portion of the study. The most common adverse events observed were headache (in 43% of the patients), diarrhea (in 39%), and increased weight (in 26%). Most adverse events were of  grade 1 or 2. At a median followup of 14.3 months, the overall response rate was 95%, including 85% with a partial response and 10% with a partial response with lymphocytosis; the remaining 5% of patients had stable disease. Among patients with chromosome 17p13.1 deletion, the overall response rate was 100%. No cases of Richter's transformation (CLL that has evolved into large-cell lymphoma) and only one case of CLL progression have occurred.  CONCLUSIONS  In this study, the selective BTK inhibitor acalabrutinib had promising safety and efficacy profiles in patients with relapsed CLL, including those with chromosome 17p13.1 deletion. (Funded by the [employer] and others; Midwife.gov number, BPZ02585277.)  We discussed some of the risks, benefits and side-effects of Acalabrutinib Treatment intent is palliative  Some of the short term side-effects included, though not limited to, risk of fatigue, weight loss, tumor lysis syndrome, risk of allergic reactions, pancytopenia, life-threatening infections, need for transfusions of blood products, nausea, vomiting, change in bowel habits, admission to hospital for various reasons, and risks of death.   Long term side-effects are also discussed including permanent damage to nerve function, chronic fatigue, and rare secondary malignancy including bone marrow disorders.   The patient is aware that the response rates discussed earlier is not guaranteed.    After a long discussion, patient made an informed decision to proceed with the prescribed plan of care.   Patient education material was dispensed

## 2020-08-12 NOTE — Telephone Encounter (Signed)
Oral Oncology Pharmacist Encounter  Received new prescription for Calquence (acalabrutinib) for the treatment of CLL, planned duration until disease progression or unacceptable drug toxicity.  Prescription dose and frequency assessed for appropriateness. Appropriate for therapy initiation.   CBC w/ Diff and CMP from 07/21/20 assessed, noted Scr of 1.26 mg/dL - no renal dose adjustments required. WBC 24.5 K/uL and pltc stable at 125 K/uL - OK for treatment initiation.    Current medication list in Epic reviewed, DDIs with Calquence (acalabrutinib) identified:  Category C DDI between Calquence and citalopram - concurrent use of agents may increase bleed risk due to antiplatelet properties - no change or dose adjustment to these agents warranted at this time. Will counsel patient to monitor for s/sx of bleeding while on therapy.    Evaluated chart and no patient barriers to medication adherence noted.   Prescription has been e-scribed to the [facility] for benefits analysis and approval.  Oral Oncology Clinic will continue to follow for insurance authorization, copayment issues, initial counseling and start date.  Leron Croak, PharmD, BCPS Hematology/Oncology Clinical Pharmacist [facility] [phone] 08/12/2020 4:20 PM

## 2020-08-12 NOTE — Assessment & Plan Note (Signed)
She has chronic cough CT imaging showed bilateral lower lobe infiltrate, suspicious for pneumonia She is immunocompromised I recommend another course of Augmentin I also given the patient written instruction to make sure she stays on acyclovir and Bactrim long-term while on treatment

## 2020-08-12 NOTE — Progress Notes (Signed)
[place] [facility] OFFICE PROGRESS NOTE  Patient Care Team: Jonathon Jordan, MD as PCP - General (Family Medicine) Heath Lark, MD as Referring Physician (Hematology and Oncology)  ASSESSMENT & PLAN:  CLL (chronic lymphocytic leukemia) ([place]) Unfortunately, CT imaging show disease progression She is not symptomatic Insurance company have declined paying for obinutuzumab At this point in time, I recommend discontinuation of bendamustine We reviewed the current guidelines Current Category 1 recommendation for treatment of CLL is Acalabrutinib  This is based on the following publication. Acalabrutinib (ACP-196) in Relapsed Chronic Lymphocytic Leukemia by Patton Salles al  This article was published on September 23, 2014, at [email or web address] N Engl J Med 5916;384:665-99. DOI: 10.1056/NEJMoa1509981   BACKGROUND  Irreversible inhibition of Bruton's tyrosine kinase (BTK) by ibrutinib represents an important therapeutic advance for the treatment of chronic lymphocytic leukemia (CLL). However, ibrutinib also irreversibly inhibits alternative kinase targets, which potentially compromises its therapeutic index. Acalabrutinib (ACP-196) is a more selective, irreversible BTK inhibitor that is specifically designed to improve on the safety and efficacy of first-generation BTK inhibitors.  METHODS  In this uncontrolled, phase 1-2, multicenter study, we administered oral acalabrutinib to 61 patients who had relapsed CLL to assess the safety, efficacy, pharmacokinetics, and pharmacodynamics of acalabrutinib. Patients were treated with acalabrutinib at a dose of 100 to 400 mg once daily in the dose-escalation (phase 1) portion of the study and 100 mg twice daily in the expansion (phase 2) portion.  RESULTS  The median age of the patients was 67 years, and patients had received a median of three previous therapies for CLL; 31% had chromosome 17p13.1 deletion, and 75% had unmutated immunoglobulin heavy-chain variable  genes. No dose-limiting toxic effects occurred during the dose-escalation portion of the study. The most common adverse events observed were headache (in 43% of the patients), diarrhea (in 39%), and increased weight (in 26%). Most adverse events were of grade 1 or 2. At a median followup of 14.3 months, the overall response rate was 95%, including 85% with a partial response and 10% with a partial response with lymphocytosis; the remaining 5% of patients had stable disease. Among patients with chromosome 17p13.1 deletion, the overall response rate was 100%. No cases of Richter's transformation (CLL that has evolved into large-cell lymphoma) and only one case of CLL progression have occurred.  CONCLUSIONS  In this study, the selective BTK inhibitor acalabrutinib had promising safety and efficacy profiles in patients with relapsed CLL, including those with chromosome 17p13.1 deletion. (Funded by the [employer] and others; Midwife.gov number, JTT01779390.)  We discussed some of the risks, benefits and side-effects of Acalabrutinib Treatment intent is palliative  Some of the short term side-effects included, though not limited to, risk of fatigue, weight loss, tumor lysis syndrome, risk of allergic reactions, pancytopenia, life-threatening infections, need for transfusions of blood products, nausea, vomiting, change in bowel habits, admission to hospital for various reasons, and risks of death.   Long term side-effects are also discussed including permanent damage to nerve function, chronic fatigue, and rare secondary malignancy including bone marrow disorders.   The patient is aware that the response rates discussed earlier is not guaranteed.    After a long discussion, patient made an informed decision to proceed with the prescribed plan of care.   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[place]) She has chronic cough CT imaging showed bilateral lower lobe  infiltrate, suspicious for pneumonia She is immunocompromised I recommend another course of Augmentin I also given the patient  written instruction to make sure she stays on acyclovir and Bactrim long-term while on treatment  Goals of care, counseling/discussion I have extensive goals of care with the patient and her caregiver Due to her mental deficit, she is deemed not able to make informed decision on her own Her caregivers will have to make medical decisions for her; her sister, [name] is a healthcare power of attorney  due to multiple relapses, the treatment is considered palliative in nature   No orders of the defined types were placed in this encounter.   All questions were answered. The patient knows to call the clinic with any problems, questions or concerns. The total time spent in the appointment was 40 minutes encounter with patients including review of chart and various tests results, discussions about plan of care and coordination of care plan   Heath Lark, MD 08/12/2020 3:48 PM  INTERVAL HISTORY: Please see below for problem oriented charting. She returns with her sister for further follow-up She continues to have chronic cough No recent fever or chills  SUMMARY OF ONCOLOGIC HISTORY: Oncology History Overview Note  FISH del 13 q   CLL (chronic lymphocytic leukemia) ([place])  07/02/2010 Procedure   LN biopsy confirmed CLL   08/18/2010 Procedure   Bone marrow biopsy confirmed CLL   11/18/2010 - 04/15/2011 Chemotherapy   patient received 6 cycles of FLudarabine and Rituximab   01/13/2012 Relapse/Recurrence   Repeat BM biopsy confirmed relapse   01/27/2012 - 06/23/2012 Chemotherapy   Bendamustine & Rituximab given, complicated by infusion reaction to Rituximab, completed 6 cycles   06/29/2013 Imaging   Ct scan confirmed disease relapse with bulky lymphadenopathy   07/23/2013 Procedure   Repeat BM biopsy due to thrombocytopenia and progressive leukocytosis   08/02/2013  Relapse/Recurrence   Patient consented to start iburitinib as salvage therapy for relapsed CLL   11/09/2013 Imaging   Repeat CT scan the chest, abdomen and pelvis showed greater than 50% response to treatment   06/17/2014 Imaging   Repeat CT scan showed near complete response to treatment.   05/30/2015 Imaging   Repeat CT scan showed near complete response to treatment.   06/14/2016 Imaging   CT scan showed stable scattered small retroperitoneal lymph nodes and pelvic lymph nodes. No findings for recurrent or progressive lymphoma. Stable mild splenomegaly.   12/30/2017 Imaging   1. Recurrent adenopathy within the chest, abdomen, and pelvis. 2. Splenomegaly. 3. Aortic Atherosclerosis (ICD10-I70.0). 4. Coronary artery calcifications.   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 The catheter is ready for immediate use.   01/24/2018 -  Chemotherapy   The patient had Gazyva and 1 dose of bendamustine. Bendamustine was discontinued due to inability to get insurance approval   05/08/2018 Imaging   1. Interval partial treatment response. Bilateral axillary, mediastinal, right hilar, retroperitoneal and bilateral pelvic adenopathy is all decreased. Mild splenomegaly, decreased. No new or progressive disease. 2. Aortic Atherosclerosis (ICD10-I70.0).   01/31/2020 Imaging   1. Since 12/29/2017, marked progression of adenopathy within the chest, abdomen, and pelvis as detailed above. 2. Progressive splenomegaly, consistent with splenic involvement. 3. New pulmonary parenchymal findings which could represent interval infection (including atypical etiologies) or aspiration. 4. Coronary artery atherosclerosis. Aortic Atherosclerosis (ICD10-I70.0).   02/07/2020 - 07/22/2020 Chemotherapy   The patient bendamustine for chemotherapy treatment.      04/28/2020 Imaging  Chest Impression:   1. New consolidation in the LEFT upper lobe is concerning for pneumonia versus lymphoma. Recommend clinical correlation with pulmonary infection. Favor recurrent pulmonary infection in light of RIGHT lung infection demonstrated on CT January 31, 2020. 2. Resolution of ground-glass densities in the RIGHT upper lobe and consolidation in the RIGHT lower lobe consistent resolved pulmonary infection. 3. Persistent enlarged axillary, mediastinal, and hilar lymph nodes.  Overall adenopathy is mildly improved.   Abdomen / Pelvis Impression:   1. Improved bulky retroperitoneal and iliac lymphadenopathy. The large lymph nodes are measurably decreased in size although remain bulky. 2. Marked splenomegaly also improved.   08/11/2020 Imaging   1. Disease progression, as evidenced by increase in adenopathy within the chest, abdomen, and pelvis. Persistent hepatosplenomegaly, with probable splenic involvement. 2. Although the left upper lobe consolidation has resolved, there are new areas of pulmonary opacity which could represent pulmonary lymphoma or concurrent pneumonia. 3. Coronary artery atherosclerosis. Aortic Atherosclerosis     REVIEW OF SYSTEMS:   Constitutional: Denies fevers, chills or abnormal weight loss Eyes: Denies blurriness of vision Ears, nose, mouth, throat, and face: Denies mucositis or sore throat Cardiovascular: Denies palpitation, chest discomfort or lower extremity swelling Gastrointestinal:  Denies nausea, heartburn or change in bowel habits Skin: Denies abnormal skin rashes Lymphatics: Denies new lymphadenopathy or easy bruising Neurological:Denies numbness, tingling or new weaknesses Behavioral/Psych: Mood is stable, no new changes  All other systems were reviewed with the patient and are negative.  I have reviewed the past medical history, past surgical history, social history and family history with the patient and they are  unchanged from previous note.  ALLERGIES:  is allergic to azithromycin and ibuprofen.  MEDICATIONS:  Current Outpatient Medications  Medication Sig Dispense Refill  . acalabrutinib (CALQUENCE) 100 MG capsule Take 1 capsule (100 mg total) by mouth 2 (two) times daily. 60 capsule 11  . acetaminophen (TYLENOL) 325 MG tablet Take 650 mg by mouth every 6 (six) hours as needed.    Marland Kitchen acyclovir (ZOVIRAX) 400 MG tablet Take 1 tablet (400 mg total) by mouth daily. 30 tablet 5  . albuterol (VENTOLIN HFA) 108 (90 Base) MCG/ACT inhaler Inhale 2 puffs into the lungs in the morning and at bedtime.    Marland Kitchen allopurinol (ZYLOPRIM) 300 MG tablet Take 1 tablet (300 mg total) by mouth daily. 30 tablet 0  . amLODipine (NORVASC) 10 MG tablet TAKE 1 TABLET (10 MG TOTAL) BY MOUTH DAILY. 90 tablet 1  . amoxicillin-clavulanate (AUGMENTIN) 875-125 MG tablet Take 1 tablet by mouth 2 (two) times daily. 14 tablet 0  . azelastine (ASTELIN) 0.1 % nasal spray Place 1 spray into both nostrils 2 (two) times daily. Use in each nostril as directed    . bisoprolol (ZEBETA) 5 MG tablet Take 10 mg by mouth daily.   10  . Carboxymethylcellul-Glycerin (REFRESH OPTIVE SENSITIVE OP) Place 1 drop into both eyes as needed.     . citalopram (CELEXA) 20 MG tablet Take 20 mg by mouth every morning.     . fluticasone-salmeterol (ADVAIR HFA) 115-21 MCG/ACT inhaler Inhale 2 puffs into the lungs 2 (two) times daily.    Marland Kitchen gabapentin (NEURONTIN) 300 MG capsule Take 300 mg by mouth at bedtime.    . hydrochlorothiazide (HYDRODIURIL) 25 MG tablet Take 25 mg by mouth daily.    Marland Kitchen levocetirizine (XYZAL) 5 MG tablet Take 5 mg by mouth every evening.    . lidocaine-prilocaine (EMLA) cream Apply to affected area once 30 g 5  .  losartan-hydrochlorothiazide (HYZAAR) 100-25 MG tablet Take by mouth daily.   9  . Multiple Vitamins-Minerals (PRESERVISION AREDS 2) CAPS Take 1 capsule by mouth 2 (two) times daily.    . ondansetron (ZOFRAN) 8 MG tablet Take 1 tablet  (8 mg total) by mouth 2 (two) times daily. Start second day after chemotherapy. Then take as needed for nausea or vomiting. 30 tablet 1  . prochlorperazine (COMPAZINE) 10 MG tablet Take 1 tablet (10 mg total) by mouth every 6 (six) hours as needed (Nausea or vomiting). 30 tablet 1  . sulfamethoxazole-trimethoprim (BACTRIM DS) 800-160 MG tablet Take 1 tablet by mouth 3 (three) times a week. 12 tablet 5  . triamcinolone (NASACORT) 55 MCG/ACT AERO nasal inhaler Place 2 sprays into the nose daily. 1 Inhaler 12  . Vitamin D, Ergocalciferol, 2000 units CAPS Take by mouth.     No current facility-administered medications for this visit.    PHYSICAL EXAMINATION: ECOG PERFORMANCE STATUS: 1 - Symptomatic but completely ambulatory  Vitals:   08/12/20 1311  BP: (!) 137/59  Pulse: 74  Resp: 18  Temp: 98.5 F (36.9 C)  SpO2: 95%   Filed Weights   08/12/20 1311  Weight: 245 lb 12.8 oz (111.5 kg)    GENERAL:alert, no distress and comfortable NEURO: alert & oriented x 3 with fluent speech, no focal motor/sensory deficits  LABORATORY DATA:  I have reviewed the data as listed    Component Value Date/Time   NA 142 07/21/2020 1120   NA 141 08/23/2017 1026   K 4.2 07/21/2020 1120   K 3.7 08/23/2017 1026   CL 105 07/21/2020 1120   CL 98 02/02/2013 1353   CO2 29 07/21/2020 1120   CO2 33 (H) 08/23/2017 1026   GLUCOSE 102 (H) 07/21/2020 1120   GLUCOSE 92 08/23/2017 1026   GLUCOSE 99 02/02/2013 1353   BUN 26 (H) 07/21/2020 1120   BUN 21.0 08/23/2017 1026   CREATININE 1.26 (H) 07/21/2020 1120   CREATININE 1.2 (H) 08/23/2017 1026   CALCIUM 9.5 07/21/2020 1120   CALCIUM 9.3 08/23/2017 1026   PROT 6.1 (L) 07/21/2020 1120   PROT 5.9 (L) 03/28/2020 1044   PROT 6.3 (L) 08/23/2017 1026   ALBUMIN 3.8 07/21/2020 1120   ALBUMIN 3.8 08/23/2017 1026   AST 19 07/21/2020 1120   AST 11 08/23/2017 1026   ALT 14 07/21/2020 1120   ALT 10 08/23/2017 1026   ALKPHOS 83 07/21/2020 1120   ALKPHOS 66  08/23/2017 1026   BILITOT 0.5 07/21/2020 1120   BILITOT 0.59 08/23/2017 1026   GFRNONAA 42 (L) 07/21/2020 1120   GFRAA 49 (L) 07/21/2020 1120    No results found for: SPEP, UPEP  Lab Results  Component Value Date   WBC 24.5 (H) 07/21/2020   NEUTROABS 5.1 07/21/2020   HGB 12.0 07/21/2020   HCT 37.5 07/21/2020   MCV 86.4 07/21/2020   PLT 125 (L) 07/21/2020      Chemistry      Component Value Date/Time   NA 142 07/21/2020 1120   NA 141 08/23/2017 1026   K 4.2 07/21/2020 1120   K 3.7 08/23/2017 1026   CL 105 07/21/2020 1120   CL 98 02/02/2013 1353   CO2 29 07/21/2020 1120   CO2 33 (H) 08/23/2017 1026   BUN 26 (H) 07/21/2020 1120   BUN 21.0 08/23/2017 1026   CREATININE 1.26 (H) 07/21/2020 1120   CREATININE 1.2 (H) 08/23/2017 1026      Component Value Date/Time  CALCIUM 9.5 07/21/2020 1120   CALCIUM 9.3 08/23/2017 1026   ALKPHOS 83 07/21/2020 1120   ALKPHOS 66 08/23/2017 1026   AST 19 07/21/2020 1120   AST 11 08/23/2017 1026   ALT 14 07/21/2020 1120   ALT 10 08/23/2017 1026   BILITOT 0.5 07/21/2020 1120   BILITOT 0.59 08/23/2017 1026       RADIOGRAPHIC STUDIES: I have personally reviewed the radiological images as listed and agreed with the findings in the report. CT CHEST ABDOMEN PELVIS W CONTRAST  Result Date: 08/11/2020 CLINICAL DATA:  Chronic lymphocytic leukemia diagnosed in 2011. Chemotherapy in progress. Cough for 2 years. Hysterectomy. Cholecystectomy. EXAM: CT CHEST, ABDOMEN, AND PELVIS WITH CONTRAST TECHNIQUE: Multidetector CT imaging of the chest, abdomen and pelvis was performed following the standard protocol during bolus administration of intravenous contrast. CONTRAST:  62m OMNIPAQUE IOHEXOL 300 MG/ML  SOLN COMPARISON:  04/28/2020 thin FINDINGS: CT CHEST FINDINGS Cardiovascular: Right Port-A-Cath tip at mid right atrium. Bovine arch. Aortic atherosclerosis. Normal heart size, without pericardial effusion. Multivessel coronary artery atherosclerosis.  No central pulmonary embolism, on this non-dedicated study. Mediastinum/Nodes: Left low jugular node measures 1.7 cm on 06/02 versus similar on the prior exam (when remeasured). Bilateral axillary adenopathy, with an index left-sided node measuring 1.2 cm on 15/2 versus 1.0 cm on the prior exam (when remeasured). Right paratracheal node with maintenance of its fatty hilum measures 1.5 cm on 20/2 versus 1.3 cm on the prior exam (when remeasured). Nodal mass within the subcarinal station with extension into the azygoesophageal recess measures 2.7 cm on 25/2 versus 2.1 cm when measured at a similar level on the prior exam. Right infrahilar node measures 2.1 cm on 31/2 versus 1.9 cm on the prior. Lungs/Pleura: No pleural fluid. Previously described left upper lobe consolidation has resolved. Similar right upper lobe peribronchovascular ground-glass nodularity. Worsened bibasilar aeration. Right lower lobe primarily dependent consolidation, at the site of scarring on the prior exam. Example 80/6. Superior segment left lower lobe peribronchovascular mild consolidation including on 58/6, new. Volume loss and peribronchovascular airspace disease within the inferior right middle lobe, including on 90/6, new. Musculoskeletal: No acute osseous abnormality. CT ABDOMEN PELVIS FINDINGS Hepatobiliary: Mild hepatomegaly at 18.1 cm craniocaudal, chronic. No focal liver lesion. Cholecystectomy, without biliary ductal dilatation. Pancreas: Normal, without mass or ductal dilatation. Spleen: Splenomegaly at 20.7 cm craniocaudal versus 19.6 cm on the prior. subcentimeter foci of hypoattenuation within the spleen are similar, favored to represent lymphomatous involvement. Adrenals/Urinary Tract: Normal adrenal glands. Normal kidneys, without hydronephrosis. Normal urinary bladder. Stomach/Bowel: Normal stomach, without wall thickening. Normal colon, appendix, and terminal ileum. Normal small bowel. Vascular/Lymphatic: Advanced aortic and  branch vessel atherosclerosis. Left periaortic node measures 4.3 cm on 74/2 versus 3.4 cm on the prior. Index retrocaval node measures 2.8 cm on 66/2 versus 2.4 cm on the prior. Small bowel mesenteric node measures 1.6 cm on 73/2 versus 1.4 cm on the prior. Massive pelvic sidewall adenopathy. An index left external iliac nodal mass measures 4.3 cm short axis on 105/2 versus 3.7 cm on the prior. Reproductive: Hysterectomy.  No adnexal mass. Other: No significant free fluid. Mild pelvic floor laxity. Ventral pelvic wall laxity contains fat Musculoskeletal: Thoracolumbar spondylosis. IMPRESSION: 1. Disease progression, as evidenced by increase in adenopathy within the chest, abdomen, and pelvis. Persistent hepatosplenomegaly, with probable splenic involvement. 2. Although the left upper lobe consolidation has resolved, there are new areas of pulmonary opacity which could represent pulmonary lymphoma or concurrent pneumonia. 3. Coronary artery atherosclerosis. Aortic Atherosclerosis (  ICD10-I70.0). Electronically Signed   By: Abigail Miyamoto M.D.   On: 08/11/2020 13:41

## 2020-08-13 ENCOUNTER — Telehealth: Payer: Self-pay

## 2020-08-13 DIAGNOSIS — R262 Difficulty in walking, not elsewhere classified: Secondary | ICD-10-CM | POA: Diagnosis not present

## 2020-08-13 DIAGNOSIS — M6281 Muscle weakness (generalized): Secondary | ICD-10-CM | POA: Diagnosis not present

## 2020-08-13 DIAGNOSIS — R2681 Unsteadiness on feet: Secondary | ICD-10-CM | POA: Diagnosis not present

## 2020-08-13 NOTE — Telephone Encounter (Signed)
Anderson Malta nurse with [facility] called to clarify Acyclovir if it has changed to 400 mg daily.  Called back and per Dr. Alvy Bimler. Yes Acyclovir has changed to daily. Anderson Malta verbalized understanding.

## 2020-08-13 NOTE — Telephone Encounter (Signed)
Oral Oncology Patient Advocate Encounter  Was successful in securing patient a $8000 grant from [facility] to provide copayment coverage for Calquence.  This will keep the out of pocket expense at $0.     [employer] ID: [id number]  I have spoken with the patient.   The billing information is as follows and has been shared with [place]: [id number] PCN: [id number] Member ID: [id number] Group ID: [id number] Dates of Eligibility: 07/14/20 through 07/13/21  Fund:  [place] Patient [place] Phone [phone] Fax [phone] 08/13/2020 2:06 PM

## 2020-08-13 NOTE — Telephone Encounter (Signed)
Oral Oncology Patient Advocate Encounter   Was successful in securing patient an $55 grant from Patient [place] [facility]) to provide copayment coverage for Calquence.  This will keep the out of pocket expense at $0.     I have spoken with the patient.    The billing information is as follows and has been shared with [place].   Member ID: [id number] Group ID: [id number] RxBin: [id number] Dates of Eligibility: 05/15/20 through 08/12/21  Fund:  [place] Patient [place] Phone [phone] Fax [phone] 08/13/2020 2:10 PM

## 2020-08-13 NOTE — Telephone Encounter (Signed)
Oral Oncology Patient Advocate Encounter  Prior Authorization for Calquence has been approved.    PA# [id number] Effective dates: 08/12/20 through 08/12/21  Patients co-pay is $1601  Oral Oncology Clinic will continue to follow.    Erin Moore Patient [place] Phone [phone] Fax [phone] 08/13/2020 8:11 AM

## 2020-08-14 MED FILL — CALQUENCE 100 MG CAPSULE: 100 | 30 days supply | Qty: 60 | Fill #0

## 2020-08-14 NOTE — Telephone Encounter (Signed)
Oral Chemotherapy Pharmacist Encounter   Attempted to reach patient to provide update and offer for initial counseling on oral medication: Calquence (acalabrutinib).  No answer.  Left voicemail for patient to call back to discuss details of medication acquisition and initial counseling session.  Leron Croak, PharmD, BCPS Hematology/Oncology Clinical Pharmacist [facility] [phone] 08/14/2020 2:20 PM

## 2020-08-14 NOTE — Telephone Encounter (Signed)
Oral Chemotherapy Pharmacist Encounter  I spoke with patient's sister, Erin Moore, for overview of: Calquence (acalabrutinib) for the treatment of CLL, planned duration until disease progression or unacceptable toxicity.   Counseled on administration, dosing, side effects, monitoring, drug-food interactions, safe handling, storage, and disposal.  Patient will take Calquence 100mg  capsules, 1 capsule by mouth approximately 12 hours apart, with or with out food, with a glass of water.  Instructed about avoiding acid suppressing agents, if needed, may seperate use of an H2RA blocker or antacid by 2 hours from Calquence admisinstration.  Calquence start date: ~08/16/20  Adverse effects include but are not limited to: headache, diarrhea, fatigue, rash, muscle pain, bruising, decreased blood counts, and altered cardiac conduction.    Reviewed importance of keeping a medication schedule and plan for any missed doses. No barriers to medication adherence identified.  Medication reconciliation performed and medication/allergy list updated.  Insurance authorization for [employer] has been obtained. Test claim at the pharmacy revealed copayment $1601 for 1st fill of Calquence. This is unaffordable to patient. Specialty Pharmacy Patient Advocate was able to obtain grant funding to decrease patient's out of pocket cost to $0.  This will ship from the [place] on 08/14/20 to deliver to sister's home on 08/15/20. Erin Moore stated she will take medication to assisted living facility once she receives it.   Sister informed the pharmacy will reach out 5-7 days prior to needing next fill of Calquence to coordinate continued medication acquisition to prevent break in therapy.  All questions answered.  Erin Moore voiced understanding and appreciation.   Medication education handout placed in mail for sister and assisted living facility. Erin Moore knows to call the office with questions or  concerns. Oral Chemotherapy Clinic phone number provided.   Leron Croak, PharmD, BCPS Hematology/Oncology Clinical Pharmacist [facility] [phone] 08/14/2020 3:08 PM

## 2020-08-15 DIAGNOSIS — R262 Difficulty in walking, not elsewhere classified: Secondary | ICD-10-CM | POA: Diagnosis not present

## 2020-08-15 DIAGNOSIS — M6281 Muscle weakness (generalized): Secondary | ICD-10-CM | POA: Diagnosis not present

## 2020-08-15 DIAGNOSIS — R2681 Unsteadiness on feet: Secondary | ICD-10-CM | POA: Diagnosis not present

## 2020-08-18 ENCOUNTER — Telehealth: Payer: Self-pay | Admitting: Hematology and Oncology

## 2020-08-18 DIAGNOSIS — M6281 Muscle weakness (generalized): Secondary | ICD-10-CM | POA: Diagnosis not present

## 2020-08-18 DIAGNOSIS — R2681 Unsteadiness on feet: Secondary | ICD-10-CM | POA: Diagnosis not present

## 2020-08-18 DIAGNOSIS — R262 Difficulty in walking, not elsewhere classified: Secondary | ICD-10-CM | POA: Diagnosis not present

## 2020-08-18 NOTE — Telephone Encounter (Signed)
Called pt sister per 10/29 sch msg - no answer. Left message with appt date and time

## 2020-08-19 DIAGNOSIS — R262 Difficulty in walking, not elsewhere classified: Secondary | ICD-10-CM | POA: Diagnosis not present

## 2020-08-19 DIAGNOSIS — M6281 Muscle weakness (generalized): Secondary | ICD-10-CM | POA: Diagnosis not present

## 2020-08-19 DIAGNOSIS — R2681 Unsteadiness on feet: Secondary | ICD-10-CM | POA: Diagnosis not present

## 2020-08-21 DIAGNOSIS — M6281 Muscle weakness (generalized): Secondary | ICD-10-CM | POA: Diagnosis not present

## 2020-08-21 DIAGNOSIS — R262 Difficulty in walking, not elsewhere classified: Secondary | ICD-10-CM | POA: Diagnosis not present

## 2020-08-21 DIAGNOSIS — R2681 Unsteadiness on feet: Secondary | ICD-10-CM | POA: Diagnosis not present

## 2020-08-25 ENCOUNTER — Other Ambulatory Visit: Payer: Self-pay

## 2020-08-25 ENCOUNTER — Telehealth: Payer: Self-pay | Admitting: Hematology and Oncology

## 2020-08-25 ENCOUNTER — Inpatient Hospital Stay: Payer: Medicare Other | Attending: Hematology and Oncology | Admitting: Hematology and Oncology

## 2020-08-25 ENCOUNTER — Inpatient Hospital Stay: Payer: Medicare Other

## 2020-08-25 ENCOUNTER — Telehealth: Payer: Self-pay

## 2020-08-25 ENCOUNTER — Encounter: Payer: Self-pay | Admitting: Hematology and Oncology

## 2020-08-25 VITALS — BP 131/49 | HR 63 | Temp 97.6°F | Resp 18 | Ht 62.0 in | Wt 247.0 lb

## 2020-08-25 DIAGNOSIS — E538 Deficiency of other specified B group vitamins: Secondary | ICD-10-CM | POA: Diagnosis not present

## 2020-08-25 DIAGNOSIS — N183 Chronic kidney disease, stage 3 unspecified: Secondary | ICD-10-CM | POA: Insufficient documentation

## 2020-08-25 DIAGNOSIS — J849 Interstitial pulmonary disease, unspecified: Secondary | ICD-10-CM | POA: Diagnosis not present

## 2020-08-25 DIAGNOSIS — R262 Difficulty in walking, not elsewhere classified: Secondary | ICD-10-CM | POA: Diagnosis not present

## 2020-08-25 DIAGNOSIS — M6281 Muscle weakness (generalized): Secondary | ICD-10-CM | POA: Diagnosis not present

## 2020-08-25 DIAGNOSIS — Z6841 Body Mass Index (BMI) 40.0 and over, adult: Secondary | ICD-10-CM | POA: Diagnosis not present

## 2020-08-25 DIAGNOSIS — C9110 Chronic lymphocytic leukemia of B-cell type not having achieved remission: Secondary | ICD-10-CM

## 2020-08-25 DIAGNOSIS — D696 Thrombocytopenia, unspecified: Secondary | ICD-10-CM | POA: Diagnosis not present

## 2020-08-25 DIAGNOSIS — R2681 Unsteadiness on feet: Secondary | ICD-10-CM | POA: Diagnosis not present

## 2020-08-25 LAB — CMP (CANCER CENTER ONLY)
ALT: 8 U/L (ref 0–44)
AST: 15 U/L (ref 15–41)
Albumin: 4.2 g/dL (ref 3.5–5.0)
Alkaline Phosphatase: 77 U/L (ref 38–126)
Anion gap: 10 (ref 5–15)
BUN: 25 mg/dL — ABNORMAL HIGH (ref 8–23)
CO2: 27 mmol/L (ref 22–32)
Calcium: 9.5 mg/dL (ref 8.9–10.3)
Chloride: 103 mmol/L (ref 98–111)
Creatinine: 1.53 mg/dL — ABNORMAL HIGH (ref 0.44–1.00)
GFR, Estimated: 35 mL/min — ABNORMAL LOW (ref >60)
Glucose, Bld: 91 mg/dL (ref 70–99)
Potassium: 4.3 mmol/L (ref 3.5–5.1)
Sodium: 140 mmol/L (ref 135–145)
Total Bilirubin: 0.5 mg/dL (ref 0.3–1.2)
Total Protein: 6 g/dL — ABNORMAL LOW (ref 6.5–8.1)

## 2020-08-25 LAB — CBC WITH DIFFERENTIAL (CANCER CENTER ONLY)
Abs Immature Granulocytes: 0 10*3/uL (ref 0.00–0.07)
Basophils Absolute: 0 10*3/uL (ref 0.0–0.1)
Basophils Relative: 0 %
Eosinophils Absolute: 0.5 10*3/uL (ref 0.0–0.5)
Eosinophils Relative: 2 %
HCT: 38.5 % (ref 36.0–46.0)
Hemoglobin: 12.2 g/dL (ref 12.0–15.0)
Lymphocytes Relative: 75 %
Lymphs Abs: 19.3 10*3/uL — ABNORMAL HIGH (ref 0.7–4.0)
MCH: 27.9 pg (ref 26.0–34.0)
MCHC: 31.7 g/dL (ref 30.0–36.0)
MCV: 88.1 fL (ref 80.0–100.0)
Monocytes Absolute: 0.5 10*3/uL (ref 0.1–1.0)
Monocytes Relative: 2 %
Neutro Abs: 5.4 10*3/uL (ref 1.7–7.7)
Neutrophils Relative %: 21 %
Platelet Count: 107 10*3/uL — ABNORMAL LOW (ref 150–400)
RBC: 4.37 MIL/uL (ref 3.87–5.11)
RDW: 15.7 % — ABNORMAL HIGH (ref 11.5–15.5)
WBC Count: 25.7 10*3/uL — ABNORMAL HIGH (ref 4.0–10.5)
nRBC: 0 % (ref 0.0–0.2)

## 2020-08-25 NOTE — Assessment & Plan Note (Signed)
This is likely due to recent treatment. The patient denies recent history of bleeding such as epistaxis, hematuria or hematochezia. She is asymptomatic from the low platelet count. I will observe for now.  she does not require transfusion now. I will continue the chemotherapy at current dose without dosage adjustment.  If the thrombocytopenia gets progressive worse in the future, I might have to delay her treatment or adjust the chemotherapy dose.   

## 2020-08-25 NOTE — Progress Notes (Signed)
[place] OFFICE PROGRESS NOTE  Patient Care Team: Jonathon Jordan, MD as PCP - General (Family Medicine) Heath Lark, MD as Referring Physician (Hematology and Oncology)  ASSESSMENT & PLAN:  CLL (chronic lymphocytic leukemia) ([place]) Overall, she tolerated treatment well She is able to document compliance to taking her prescribed chemotherapy as scheduled Clinically, I did not appreciate any change to her lymphadenopathy The lymphocytosis and thrombocytopenia are expected side effects of treatment I will see her again in 2 weeks for further follow-up  Thrombocytopenia, unspecified ([place]) This is likely due to recent treatment. The patient denies recent history of bleeding such as epistaxis, hematuria or hematochezia. She is asymptomatic from the low platelet count. I will observe for now.  she does not require transfusion now. I will continue the chemotherapy at current dose without dosage adjustment.  If the thrombocytopenia gets progressive worse in the future, I might have to delay her treatment or adjust the chemotherapy dose.    Bilateral interstitial pneumonia ([place]) She has completed a course of antibiotics Her lung exam is benign and she felt better Plan to repeat CT imaging in 3 months  Vitamin B12 deficiency She has been receiving multiple doses of vitamin B12 injections I will order vitamin B12 level in her next visit   Orders Placed This Encounter  Procedures  . Vitamin B12    Standing Status:   Future    Standing Expiration Date:   08/25/2021    All questions were answered. The patient knows to call the clinic with any problems, questions or concerns. The total time spent in the appointment was 20 minutes encounter with patients including review of chart and various tests results, discussions about plan of care and coordination of care plan   Heath Lark, MD 08/25/2020 12:15 PM  INTERVAL HISTORY: Please see below for problem oriented charting. She  returns for follow-up with her sister Her sister to picture of her chemotherapy pill and gave that picture to the patient Every day, the patient is supposed to text her sister a message to verify that she has indeed received the chemotherapy and started taking her medication correctly She feels well She denies side effects of treatment so far She is attempting to cut out carbs and snacks and exercise more Her cough has improved The patient denies any recent signs or symptoms of bleeding such as spontaneous epistaxis, hematuria or hematochezia.   SUMMARY OF ONCOLOGIC HISTORY: Oncology History Overview Note  FISH del 13 q   CLL (chronic lymphocytic leukemia) ([place])  07/02/2010 Procedure   LN biopsy confirmed CLL   08/18/2010 Procedure   Bone marrow biopsy confirmed CLL   11/18/2010 - 04/15/2011 Chemotherapy   patient received 6 cycles of FLudarabine and Rituximab   01/13/2012 Relapse/Recurrence   Repeat BM biopsy confirmed relapse   01/27/2012 - 06/23/2012 Chemotherapy   Bendamustine & Rituximab given, complicated by infusion reaction to Rituximab, completed 6 cycles   06/29/2013 Imaging   Ct scan confirmed disease relapse with bulky lymphadenopathy   07/23/2013 Procedure   Repeat BM biopsy due to thrombocytopenia and progressive leukocytosis   08/02/2013 Relapse/Recurrence   Patient consented to start iburitinib as salvage therapy for relapsed CLL   11/09/2013 Imaging   Repeat CT scan the chest, abdomen and pelvis showed greater than 50% response to treatment   06/17/2014 Imaging   Repeat CT scan showed near complete response to treatment.   05/30/2015 Imaging   Repeat CT scan showed near complete response to treatment.  06/14/2016 Imaging   CT scan showed stable scattered small retroperitoneal lymph nodes and pelvic lymph nodes. No findings for recurrent or progressive lymphoma. Stable mild splenomegaly.   12/30/2017 Imaging   1. Recurrent adenopathy within the chest, abdomen, and  pelvis. 2. Splenomegaly. 3. Aortic Atherosclerosis (ICD10-I70.0). 4. Coronary artery calcifications.   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 The catheter is ready for immediate use.   01/24/2018 -  Chemotherapy   The patient had Gazyva and 1 dose of bendamustine. Bendamustine was discontinued due to inability to get insurance approval   05/08/2018 Imaging   1. Interval partial treatment response. Bilateral axillary, mediastinal, right hilar, retroperitoneal and bilateral pelvic adenopathy is all decreased. Mild splenomegaly, decreased. No new or progressive disease. 2. Aortic Atherosclerosis (ICD10-I70.0).   01/31/2020 Imaging   1. Since 12/29/2017, marked progression of adenopathy within the chest, abdomen, and pelvis as detailed above. 2. Progressive splenomegaly, consistent with splenic involvement. 3. New pulmonary parenchymal findings which could represent interval infection (including atypical etiologies) or aspiration. 4. Coronary artery atherosclerosis. Aortic Atherosclerosis (ICD10-I70.0).   02/07/2020 - 07/22/2020 Chemotherapy   The patient bendamustine for chemotherapy treatment.     04/28/2020 Imaging   Chest Impression:   1. New consolidation in the LEFT upper lobe is concerning for pneumonia versus lymphoma. Recommend clinical correlation with pulmonary infection. Favor recurrent pulmonary infection in light of RIGHT lung infection demonstrated on CT January 31, 2020. 2. Resolution of ground-glass densities in the RIGHT upper lobe and consolidation in the RIGHT lower lobe consistent resolved pulmonary infection. 3. Persistent enlarged axillary, mediastinal, and hilar lymph nodes.  Overall adenopathy is mildly improved.   Abdomen / Pelvis Impression:   1. Improved bulky retroperitoneal and iliac lymphadenopathy. The  large lymph nodes are measurably decreased in size although remain bulky. 2. Marked splenomegaly also improved.   08/11/2020 Imaging   1. Disease progression, as evidenced by increase in adenopathy within the chest, abdomen, and pelvis. Persistent hepatosplenomegaly, with probable splenic involvement. 2. Although the left upper lobe consolidation has resolved, there are new areas of pulmonary opacity which could represent pulmonary lymphoma or concurrent pneumonia. 3. Coronary artery atherosclerosis. Aortic Atherosclerosis   08/18/2020 -  Chemotherapy   The patient had acalabrutinib for chemotherapy treatment.       REVIEW OF SYSTEMS:   Constitutional: Denies fevers, chills or abnormal weight loss Eyes: Denies blurriness of vision Ears, nose, mouth, throat, and face: Denies mucositis or sore throat Cardiovascular: Denies palpitation, chest discomfort or lower extremity swelling Gastrointestinal:  Denies nausea, heartburn or change in bowel habits Skin: Denies abnormal skin rashes Lymphatics: Denies new lymphadenopathy or easy bruising Neurological:Denies numbness, tingling or new weaknesses Behavioral/Psych: Mood is stable, no new changes  All other systems were reviewed with the patient and are negative.  I have reviewed the past medical history, past surgical history, social history and family history with the patient and they are unchanged from previous note.  ALLERGIES:  is allergic to azithromycin and ibuprofen.  MEDICATIONS:  Current Outpatient Medications  Medication Sig Dispense Refill  . acalabrutinib (CALQUENCE) 100 MG capsule Take 1 capsule (100 mg total) by mouth 2 (two) times daily. 60 capsule 11  . acetaminophen (TYLENOL) 325 MG tablet Take 650 mg by mouth every 6 (six) hours as needed.    Marland Kitchen acyclovir (ZOVIRAX) 400 MG tablet Take 1  tablet (400 mg total) by mouth daily. 30 tablet 5  . albuterol (VENTOLIN HFA) 108 (90 Base) MCG/ACT inhaler Inhale 2 puffs into the lungs  in the morning and at bedtime.    Marland Kitchen allopurinol (ZYLOPRIM) 300 MG tablet Take 1 tablet (300 mg total) by mouth daily. 30 tablet 0  . amLODipine (NORVASC) 10 MG tablet TAKE 1 TABLET (10 MG TOTAL) BY MOUTH DAILY. 90 tablet 1  . amoxicillin-clavulanate (AUGMENTIN) 875-125 MG tablet Take 1 tablet by mouth 2 (two) times daily. 14 tablet 0  . azelastine (ASTELIN) 0.1 % nasal spray Place 1 spray into both nostrils 2 (two) times daily. Use in each nostril as directed    . bisoprolol (ZEBETA) 5 MG tablet Take 10 mg by mouth daily.   10  . Carboxymethylcellul-Glycerin (REFRESH OPTIVE SENSITIVE OP) Place 1 drop into both eyes as needed.     . citalopram (CELEXA) 20 MG tablet Take 20 mg by mouth every morning.     . fluticasone-salmeterol (ADVAIR HFA) 115-21 MCG/ACT inhaler Inhale 2 puffs into the lungs 2 (two) times daily.    Marland Kitchen gabapentin (NEURONTIN) 300 MG capsule Take 300 mg by mouth at bedtime.    . hydrochlorothiazide (HYDRODIURIL) 25 MG tablet Take 25 mg by mouth daily.    Marland Kitchen levocetirizine (XYZAL) 5 MG tablet Take 5 mg by mouth every evening.    . lidocaine-prilocaine (EMLA) cream Apply to affected area once 30 g 5  . losartan-hydrochlorothiazide (HYZAAR) 100-25 MG tablet Take by mouth daily.   9  . Multiple Vitamins-Minerals (PRESERVISION AREDS 2) CAPS Take 1 capsule by mouth 2 (two) times daily.    . ondansetron (ZOFRAN) 8 MG tablet Take 1 tablet (8 mg total) by mouth 2 (two) times daily. Start second day after chemotherapy. Then take as needed for nausea or vomiting. 30 tablet 1  . prochlorperazine (COMPAZINE) 10 MG tablet Take 1 tablet (10 mg total) by mouth every 6 (six) hours as needed (Nausea or vomiting). 30 tablet 1  . sulfamethoxazole-trimethoprim (BACTRIM DS) 800-160 MG tablet Take 1 tablet by mouth 3 (three) times a week. 12 tablet 5  . triamcinolone (NASACORT) 55 MCG/ACT AERO nasal inhaler Place 2 sprays into the nose daily. 1 Inhaler 12  . Vitamin D, Ergocalciferol, 2000 units CAPS Take by  mouth.     No current facility-administered medications for this visit.    PHYSICAL EXAMINATION: ECOG PERFORMANCE STATUS: 1 - Symptomatic but completely ambulatory  Vitals:   08/25/20 1147  BP: (!) 131/49  Pulse: 63  Resp: 18  Temp: 97.6 F (36.4 C)  SpO2: 96%   Filed Weights   08/25/20 1147  Weight: 247 lb (112 kg)    GENERAL:alert, no distress and comfortable SKIN: skin color, texture, turgor are normal, no rashes or significant lesions EYES: normal, Conjunctiva are pink and non-injected, sclera clear OROPHARYNX:no exudate, no erythema and lips, buccal mucosa, and tongue normal  NECK: supple, thyroid normal size, non-tender, without nodularity LYMPH: She has palpable lymphadenopathy in her right axilla  LUNGS: clear to auscultation and percussion with normal breathing effort HEART: regular rate & rhythm and no murmurs and no lower extremity edema ABDOMEN:abdomen soft, non-tender and normal bowel sounds Musculoskeletal:no cyanosis of digits and no clubbing  NEURO: alert & oriented x 3 with fluent speech, no focal motor/sensory deficits  LABORATORY DATA:  I have reviewed the data as listed    Component Value Date/Time   NA 140 08/25/2020 1129   NA 141 08/23/2017 1026  K 4.3 08/25/2020 1129   K 3.7 08/23/2017 1026   CL 103 08/25/2020 1129   CL 98 02/02/2013 1353   CO2 27 08/25/2020 1129   CO2 33 (H) 08/23/2017 1026   GLUCOSE 91 08/25/2020 1129   GLUCOSE 92 08/23/2017 1026   GLUCOSE 99 02/02/2013 1353   BUN 25 (H) 08/25/2020 1129   BUN 21.0 08/23/2017 1026   CREATININE 1.53 (H) 08/25/2020 1129   CREATININE 1.2 (H) 08/23/2017 1026   CALCIUM 9.5 08/25/2020 1129   CALCIUM 9.3 08/23/2017 1026   PROT 6.0 (L) 08/25/2020 1129   PROT 5.9 (L) 03/28/2020 1044   PROT 6.3 (L) 08/23/2017 1026   ALBUMIN 4.2 08/25/2020 1129   ALBUMIN 3.8 08/23/2017 1026   AST 15 08/25/2020 1129   AST 11 08/23/2017 1026   ALT 8 08/25/2020 1129   ALT 10 08/23/2017 1026   ALKPHOS 77  08/25/2020 1129   ALKPHOS 66 08/23/2017 1026   BILITOT 0.5 08/25/2020 1129   BILITOT 0.59 08/23/2017 1026   GFRNONAA 35 (L) 08/25/2020 1129   GFRAA 49 (L) 07/21/2020 1120    No results found for: SPEP, UPEP  Lab Results  Component Value Date   WBC 25.7 (H) 08/25/2020   NEUTROABS 5.4 08/25/2020   HGB 12.2 08/25/2020   HCT 38.5 08/25/2020   MCV 88.1 08/25/2020   PLT 107 (L) 08/25/2020      Chemistry      Component Value Date/Time   NA 140 08/25/2020 1129   NA 141 08/23/2017 1026   K 4.3 08/25/2020 1129   K 3.7 08/23/2017 1026   CL 103 08/25/2020 1129   CL 98 02/02/2013 1353   CO2 27 08/25/2020 1129   CO2 33 (H) 08/23/2017 1026   BUN 25 (H) 08/25/2020 1129   BUN 21.0 08/23/2017 1026   CREATININE 1.53 (H) 08/25/2020 1129   CREATININE 1.2 (H) 08/23/2017 1026      Component Value Date/Time   CALCIUM 9.5 08/25/2020 1129   CALCIUM 9.3 08/23/2017 1026   ALKPHOS 77 08/25/2020 1129   ALKPHOS 66 08/23/2017 1026   AST 15 08/25/2020 1129   AST 11 08/23/2017 1026   ALT 8 08/25/2020 1129   ALT 10 08/23/2017 1026   BILITOT 0.5 08/25/2020 1129   BILITOT 0.59 08/23/2017 1026

## 2020-08-25 NOTE — Telephone Encounter (Signed)
Scheduled appointments per 11/8 sch msg. Spoke to patient who is aware of appointment date and time.

## 2020-08-25 NOTE — Assessment & Plan Note (Signed)
Overall, she tolerated treatment well She is able to document compliance to taking her prescribed chemotherapy as scheduled Clinically, I did not appreciate any change to her lymphadenopathy The lymphocytosis and thrombocytopenia are expected side effects of treatment I will see her again in 2 weeks for further follow-up

## 2020-08-25 NOTE — Assessment & Plan Note (Signed)
She has been receiving multiple doses of vitamin B12 injections I will order vitamin B12 level in her next visit

## 2020-08-25 NOTE — Telephone Encounter (Signed)
Called and left below message for sister, [name]. Ask her to call the office back for questions.

## 2020-08-25 NOTE — Telephone Encounter (Signed)
-----   Message from Heath Lark, MD sent at 08/25/2020 12:11 PM EST ----- Regarding: pls call sister to remind the patient to drink more water. labs today showed slight dehydration

## 2020-08-25 NOTE — Assessment & Plan Note (Signed)
She has completed a course of antibiotics Her lung exam is benign and she felt better Plan to repeat CT imaging in 3 months

## 2020-08-26 DIAGNOSIS — M6281 Muscle weakness (generalized): Secondary | ICD-10-CM | POA: Diagnosis not present

## 2020-08-26 DIAGNOSIS — R262 Difficulty in walking, not elsewhere classified: Secondary | ICD-10-CM | POA: Diagnosis not present

## 2020-08-26 DIAGNOSIS — R2681 Unsteadiness on feet: Secondary | ICD-10-CM | POA: Diagnosis not present

## 2020-08-28 DIAGNOSIS — R2681 Unsteadiness on feet: Secondary | ICD-10-CM | POA: Diagnosis not present

## 2020-08-28 DIAGNOSIS — M6281 Muscle weakness (generalized): Secondary | ICD-10-CM | POA: Diagnosis not present

## 2020-08-28 DIAGNOSIS — R262 Difficulty in walking, not elsewhere classified: Secondary | ICD-10-CM | POA: Diagnosis not present

## 2020-08-29 ENCOUNTER — Other Ambulatory Visit: Payer: Self-pay

## 2020-08-29 ENCOUNTER — Ambulatory Visit
Admission: RE | Admit: 2020-08-29 | Discharge: 2020-08-29 | Disposition: A | Discharge status: Home / Self Care | Payer: Medicare Other | Source: Ambulatory Visit | Attending: Family Medicine | Admitting: Family Medicine

## 2020-08-29 DIAGNOSIS — Z1231 Encounter for screening mammogram for malignant neoplasm of breast: Secondary | ICD-10-CM

## 2020-09-01 DIAGNOSIS — M6281 Muscle weakness (generalized): Secondary | ICD-10-CM | POA: Diagnosis not present

## 2020-09-01 DIAGNOSIS — R262 Difficulty in walking, not elsewhere classified: Secondary | ICD-10-CM | POA: Diagnosis not present

## 2020-09-01 DIAGNOSIS — R2681 Unsteadiness on feet: Secondary | ICD-10-CM | POA: Diagnosis not present

## 2020-09-02 DIAGNOSIS — M6281 Muscle weakness (generalized): Secondary | ICD-10-CM | POA: Diagnosis not present

## 2020-09-02 DIAGNOSIS — R2681 Unsteadiness on feet: Secondary | ICD-10-CM | POA: Diagnosis not present

## 2020-09-02 DIAGNOSIS — R262 Difficulty in walking, not elsewhere classified: Secondary | ICD-10-CM | POA: Diagnosis not present

## 2020-09-03 ENCOUNTER — Other Ambulatory Visit: Payer: Self-pay | Admitting: Family Medicine

## 2020-09-03 DIAGNOSIS — R928 Other abnormal and inconclusive findings on diagnostic imaging of breast: Secondary | ICD-10-CM

## 2020-09-04 ENCOUNTER — Telehealth: Payer: Self-pay

## 2020-09-04 DIAGNOSIS — R2681 Unsteadiness on feet: Secondary | ICD-10-CM | POA: Diagnosis not present

## 2020-09-04 DIAGNOSIS — M6281 Muscle weakness (generalized): Secondary | ICD-10-CM | POA: Diagnosis not present

## 2020-09-04 DIAGNOSIS — R262 Difficulty in walking, not elsewhere classified: Secondary | ICD-10-CM | POA: Diagnosis not present

## 2020-09-04 NOTE — Telephone Encounter (Signed)
Leah called and left a message. She got a call yesterday from the [facility]. Erin Moore has to go back on 12/8 for a repeat mammogram/ ultrasound. She has ask that the breast center share the results with Dr. Alvy Bimler. [name] will not tell Erin Moore about the appt to go back until after thanksgiving. She ask that Dr. Alvy Bimler not mention anything to Erin Moore on appt on 11/22. She does not want to worry her.

## 2020-09-04 NOTE — Telephone Encounter (Signed)
Called and left a message that Dr. Alvy Bimler was given her message and will not mention the breast center appt.

## 2020-09-04 NOTE — Telephone Encounter (Signed)
Frankly I would not worry about the mammogram right now We need to focus on starting her Rx for her cancer now

## 2020-09-08 ENCOUNTER — Encounter: Payer: Self-pay | Admitting: Hematology and Oncology

## 2020-09-08 ENCOUNTER — Inpatient Hospital Stay: Payer: Medicare Other

## 2020-09-08 ENCOUNTER — Telehealth: Payer: Self-pay | Admitting: Hematology and Oncology

## 2020-09-08 ENCOUNTER — Inpatient Hospital Stay: Payer: Medicare Other | Admitting: Hematology and Oncology

## 2020-09-08 ENCOUNTER — Other Ambulatory Visit: Payer: Self-pay

## 2020-09-08 VITALS — BP 112/47 | HR 60 | Temp 98.4°F | Resp 16

## 2020-09-08 DIAGNOSIS — D696 Thrombocytopenia, unspecified: Secondary | ICD-10-CM | POA: Diagnosis not present

## 2020-09-08 DIAGNOSIS — E538 Deficiency of other specified B group vitamins: Secondary | ICD-10-CM

## 2020-09-08 DIAGNOSIS — R262 Difficulty in walking, not elsewhere classified: Secondary | ICD-10-CM | POA: Diagnosis not present

## 2020-09-08 DIAGNOSIS — C9110 Chronic lymphocytic leukemia of B-cell type not having achieved remission: Secondary | ICD-10-CM

## 2020-09-08 DIAGNOSIS — J849 Interstitial pulmonary disease, unspecified: Secondary | ICD-10-CM | POA: Diagnosis not present

## 2020-09-08 DIAGNOSIS — N183 Chronic kidney disease, stage 3 unspecified: Secondary | ICD-10-CM

## 2020-09-08 DIAGNOSIS — M6281 Muscle weakness (generalized): Secondary | ICD-10-CM | POA: Diagnosis not present

## 2020-09-08 DIAGNOSIS — R2681 Unsteadiness on feet: Secondary | ICD-10-CM | POA: Diagnosis not present

## 2020-09-08 DIAGNOSIS — Z6841 Body Mass Index (BMI) 40.0 and over, adult: Secondary | ICD-10-CM | POA: Diagnosis not present

## 2020-09-08 LAB — CMP (CANCER CENTER ONLY)
ALT: 15 U/L (ref 0–44)
AST: 24 U/L (ref 15–41)
Albumin: 4.2 g/dL (ref 3.5–5.0)
Alkaline Phosphatase: 67 U/L (ref 38–126)
Anion gap: 8 (ref 5–15)
BUN: 26 mg/dL — ABNORMAL HIGH (ref 8–23)
CO2: 27 mmol/L (ref 22–32)
Calcium: 9.2 mg/dL (ref 8.9–10.3)
Chloride: 107 mmol/L (ref 98–111)
Creatinine: 1.14 mg/dL — ABNORMAL HIGH (ref 0.44–1.00)
GFR, Estimated: 51 mL/min — ABNORMAL LOW (ref >60)
Glucose, Bld: 90 mg/dL (ref 70–99)
Potassium: 4 mmol/L (ref 3.5–5.1)
Sodium: 142 mmol/L (ref 135–145)
Total Bilirubin: 0.5 mg/dL (ref 0.3–1.2)
Total Protein: 5.9 g/dL — ABNORMAL LOW (ref 6.5–8.1)

## 2020-09-08 LAB — CBC WITH DIFFERENTIAL (CANCER CENTER ONLY)
Abs Immature Granulocytes: 0.08 10*3/uL — ABNORMAL HIGH (ref 0.00–0.07)
Basophils Absolute: 0.1 10*3/uL (ref 0.0–0.1)
Basophils Relative: 0 %
Eosinophils Absolute: 0.5 10*3/uL (ref 0.0–0.5)
Eosinophils Relative: 2 %
HCT: 37.3 % (ref 36.0–46.0)
Hemoglobin: 12 g/dL (ref 12.0–15.0)
Immature Granulocytes: 0 %
Lymphocytes Relative: 86 %
Lymphs Abs: 26 10*3/uL — ABNORMAL HIGH (ref 0.7–4.0)
MCH: 28.7 pg (ref 26.0–34.0)
MCHC: 32.2 g/dL (ref 30.0–36.0)
MCV: 89.2 fL (ref 80.0–100.0)
Monocytes Absolute: 0.6 10*3/uL (ref 0.1–1.0)
Monocytes Relative: 2 %
Neutro Abs: 3.1 10*3/uL (ref 1.7–7.7)
Neutrophils Relative %: 10 %
Platelet Count: 85 10*3/uL — ABNORMAL LOW (ref 150–400)
RBC: 4.18 MIL/uL (ref 3.87–5.11)
RDW: 16.3 % — ABNORMAL HIGH (ref 11.5–15.5)
WBC Count: 30.2 10*3/uL — ABNORMAL HIGH (ref 4.0–10.5)
nRBC: 0 % (ref 0.0–0.2)

## 2020-09-08 LAB — VITAMIN B12: Vitamin B-12: 479 pg/mL (ref 180–914)

## 2020-09-08 MED ORDER — SODIUM CHLORIDE 0.9% FLUSH
10.0000 mL | Freq: Once | INTRAVENOUS | Status: AC
  Administered 2020-09-08: 10 mL
  Filled 2020-09-08: qty 10

## 2020-09-08 MED ORDER — CYANOCOBALAMIN 1000 MCG/ML IJ SOLN
1000.0000 ug | Freq: Once | INTRAMUSCULAR | Status: AC
  Administered 2020-09-08: 1000 ug via INTRAMUSCULAR

## 2020-09-08 MED ORDER — HEPARIN SOD (PORK) LOCK FLUSH 100 UNIT/ML IV SOLN
500.0000 [IU] | Freq: Once | INTRAVENOUS | Status: AC
  Administered 2020-09-08: 500 [IU]
  Filled 2020-09-08: qty 5

## 2020-09-08 MED ORDER — CYANOCOBALAMIN 1000 MCG/ML IJ SOLN
INTRAMUSCULAR | Status: AC
  Filled 2020-09-08: qty 1

## 2020-09-08 NOTE — Assessment & Plan Note (Signed)
Overall, she tolerated treatment well She is able to document compliance to taking her prescribed chemotherapy as scheduled The lymphocytosis and thrombocytopenia are expected side effects of treatment I will see her again in 4 weeks for further follow-up

## 2020-09-08 NOTE — Assessment & Plan Note (Signed)
She is able to lose weight through aggressive dietary modification and avoiding excessive snacking I congratulated her effort

## 2020-09-08 NOTE — Telephone Encounter (Signed)
Scheduled appts per 11/22 sch msg. Gave pt a print out of AVS.

## 2020-09-08 NOTE — Assessment & Plan Note (Signed)
This is likely due to recent treatment. The patient denies recent history of bleeding such as epistaxis, hematuria or hematochezia. She is asymptomatic from the low platelet count. I will observe for now.  she does not require transfusion now. I will continue the chemotherapy at current dose without dosage adjustment.  If the thrombocytopenia gets progressive worse in the future, I might have to delay her treatment or adjust the chemotherapy dose.   

## 2020-09-08 NOTE — Progress Notes (Signed)
[facility] OFFICE PROGRESS NOTE  Patient Care Team: Jonathon Jordan, MD as PCP - General (Family Medicine) Heath Lark, MD as Referring Physician (Hematology and Oncology)  ASSESSMENT & PLAN:  CLL (chronic lymphocytic leukemia) ([place]) Overall, she tolerated treatment well She is able to document compliance to taking her prescribed chemotherapy as scheduled The lymphocytosis and thrombocytopenia are expected side effects of treatment I will see her again in 4 weeks for further follow-up  Thrombocytopenia, unspecified ([place]) This is likely due to recent treatment. The patient denies recent history of bleeding such as epistaxis, hematuria or hematochezia. She is asymptomatic from the low platelet count. I will observe for now.  she does not require transfusion now. I will continue the chemotherapy at current dose without dosage adjustment.  If the thrombocytopenia gets progressive worse in the future, I might have to delay her treatment or adjust the chemotherapy dose.    Vitamin B12 deficiency She is receiving monthly vitamin B12 injection Repeat B12 level is improved She will continue monthly B12 injections for now  CKD (chronic kidney disease), symptom management only, stage 3 (moderate) Her renal function has improved/stable Continue close observation  Obesity, Class III, BMI 40-49.9 (morbid obesity) ([place]) She is able to lose weight through aggressive dietary modification and avoiding excessive snacking I congratulated her effort   No orders of the defined types were placed in this encounter.   All questions were answered. The patient knows to call the clinic with any problems, questions or concerns. The total time spent in the appointment was 20 minutes encounter with patients including review of chart and various tests results, discussions about plan of care and coordination of care plan   Heath Lark, MD 09/08/2020 2:12 PM  INTERVAL HISTORY: Please see below  for problem oriented charting. She is here today accompanied by his sister She is compliant taking her prescription chemotherapy as prescribed She denies side effects Denies excessive cough The patient denies any recent signs or symptoms of bleeding such as spontaneous epistaxis, hematuria or hematochezia. Denies increased bruises  SUMMARY OF ONCOLOGIC HISTORY: Oncology History Overview Note  FISH del 13 q   CLL (chronic lymphocytic leukemia) ([place])  07/02/2010 Procedure   LN biopsy confirmed CLL   08/18/2010 Procedure   Bone marrow biopsy confirmed CLL   11/18/2010 - 04/15/2011 Chemotherapy   patient received 6 cycles of FLudarabine and Rituximab   01/13/2012 Relapse/Recurrence   Repeat BM biopsy confirmed relapse   01/27/2012 - 06/23/2012 Chemotherapy   Bendamustine & Rituximab given, complicated by infusion reaction to Rituximab, completed 6 cycles   06/29/2013 Imaging   Ct scan confirmed disease relapse with bulky lymphadenopathy   07/23/2013 Procedure   Repeat BM biopsy due to thrombocytopenia and progressive leukocytosis   08/02/2013 Relapse/Recurrence   Patient consented to start iburitinib as salvage therapy for relapsed CLL   11/09/2013 Imaging   Repeat CT scan the chest, abdomen and pelvis showed greater than 50% response to treatment   06/17/2014 Imaging   Repeat CT scan showed near complete response to treatment.   05/30/2015 Imaging   Repeat CT scan showed near complete response to treatment.   06/14/2016 Imaging   CT scan showed stable scattered small retroperitoneal lymph nodes and pelvic lymph nodes. No findings for recurrent or progressive lymphoma. Stable mild splenomegaly.   12/30/2017 Imaging   1. Recurrent adenopathy within the chest, abdomen, and pelvis. 2. Splenomegaly. 3. Aortic Atherosclerosis (ICD10-I70.0). 4. Coronary artery calcifications.   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 The catheter is ready for immediate use.   01/24/2018 -  Chemotherapy   The patient had Gazyva and 1 dose of bendamustine. Bendamustine was discontinued due to inability to get insurance approval   05/08/2018 Imaging   1. Interval partial treatment response. Bilateral axillary, mediastinal, right hilar, retroperitoneal and bilateral pelvic adenopathy is all decreased. Mild splenomegaly, decreased. No new or progressive disease. 2. Aortic Atherosclerosis (ICD10-I70.0).   01/31/2020 Imaging   1. Since 12/29/2017, marked progression of adenopathy within the chest, abdomen, and pelvis as detailed above. 2. Progressive splenomegaly, consistent with splenic involvement. 3. New pulmonary parenchymal findings which could represent interval infection (including atypical etiologies) or aspiration. 4. Coronary artery atherosclerosis. Aortic Atherosclerosis (ICD10-I70.0).   02/07/2020 - 07/22/2020 Chemotherapy   The patient bendamustine for chemotherapy treatment.     04/28/2020 Imaging   Chest Impression:   1. New consolidation in the LEFT upper lobe is concerning for pneumonia versus lymphoma. Recommend clinical correlation with pulmonary infection. Favor recurrent pulmonary infection in light of RIGHT lung infection demonstrated on CT January 31, 2020. 2. Resolution of ground-glass densities in the RIGHT upper lobe and consolidation in the RIGHT lower lobe consistent resolved pulmonary infection. 3. Persistent enlarged axillary, mediastinal, and hilar lymph nodes.  Overall adenopathy is mildly improved.   Abdomen / Pelvis Impression:   1. Improved bulky retroperitoneal and iliac lymphadenopathy. The large lymph nodes are measurably decreased in size although remain bulky. 2. Marked splenomegaly also improved.   08/11/2020 Imaging   1. Disease progression, as evidenced by increase in adenopathy within the chest,  abdomen, and pelvis. Persistent hepatosplenomegaly, with probable splenic involvement. 2. Although the left upper lobe consolidation has resolved, there are new areas of pulmonary opacity which could represent pulmonary lymphoma or concurrent pneumonia. 3. Coronary artery atherosclerosis. Aortic Atherosclerosis   08/18/2020 -  Chemotherapy   The patient had acalabrutinib for chemotherapy treatment.       REVIEW OF SYSTEMS:   Constitutional: Denies fevers, chills or abnormal weight loss Eyes: Denies blurriness of vision Ears, nose, mouth, throat, and face: Denies mucositis or sore throat Respiratory: Denies cough, dyspnea or wheezes Cardiovascular: Denies palpitation, chest discomfort or lower extremity swelling Gastrointestinal:  Denies nausea, heartburn or change in bowel habits Skin: Denies abnormal skin rashes Lymphatics: Denies new lymphadenopathy or easy bruising Neurological:Denies numbness, tingling or new weaknesses Behavioral/Psych: Mood is stable, no new changes  All other systems were reviewed with the patient and are negative.  I have reviewed the past medical history, past surgical history, social history and family history with the patient and they are unchanged from previous note.  ALLERGIES:  is allergic to azithromycin and ibuprofen.  MEDICATIONS:  Current Outpatient Medications  Medication Sig Dispense Refill  . acalabrutinib (CALQUENCE) 100 MG capsule Take 1 capsule (100 mg total) by mouth 2 (two) times daily. 60 capsule 11  . acetaminophen (TYLENOL) 325 MG tablet Take 650 mg by mouth every 6 (six) hours as needed.    Marland Kitchen acyclovir (ZOVIRAX) 400 MG tablet Take 1 tablet (400 mg total) by mouth daily. 30 tablet 5  . albuterol (VENTOLIN HFA) 108 (90 Base) MCG/ACT inhaler Inhale 2 puffs into the lungs in the morning and at bedtime.    Marland Kitchen allopurinol (ZYLOPRIM) 300 MG tablet Take 1 tablet (300 mg total) by mouth daily. 30 tablet 0  .  amLODipine (NORVASC) 10 MG tablet TAKE  1 TABLET (10 MG TOTAL) BY MOUTH DAILY. 90 tablet 1  . azelastine (ASTELIN) 0.1 % nasal spray Place 1 spray into both nostrils 2 (two) times daily. Use in each nostril as directed    . bisoprolol (ZEBETA) 5 MG tablet Take 10 mg by mouth daily.   10  . Carboxymethylcellul-Glycerin (REFRESH OPTIVE SENSITIVE OP) Place 1 drop into both eyes as needed.     . citalopram (CELEXA) 20 MG tablet Take 20 mg by mouth every morning.     . fluticasone-salmeterol (ADVAIR HFA) 115-21 MCG/ACT inhaler Inhale 2 puffs into the lungs 2 (two) times daily.    Marland Kitchen gabapentin (NEURONTIN) 300 MG capsule Take 300 mg by mouth at bedtime.    . hydrochlorothiazide (HYDRODIURIL) 25 MG tablet Take 25 mg by mouth daily.    Marland Kitchen levocetirizine (XYZAL) 5 MG tablet Take 5 mg by mouth every evening.    . lidocaine-prilocaine (EMLA) cream Apply to affected area once 30 g 5  . losartan-hydrochlorothiazide (HYZAAR) 100-25 MG tablet Take by mouth daily.   9  . Multiple Vitamins-Minerals (PRESERVISION AREDS 2) CAPS Take 1 capsule by mouth 2 (two) times daily.    . ondansetron (ZOFRAN) 8 MG tablet Take 1 tablet (8 mg total) by mouth 2 (two) times daily. Start second day after chemotherapy. Then take as needed for nausea or vomiting. 30 tablet 1  . prochlorperazine (COMPAZINE) 10 MG tablet Take 1 tablet (10 mg total) by mouth every 6 (six) hours as needed (Nausea or vomiting). 30 tablet 1  . sulfamethoxazole-trimethoprim (BACTRIM DS) 800-160 MG tablet Take 1 tablet by mouth 3 (three) times a week. 12 tablet 5  . triamcinolone (NASACORT) 55 MCG/ACT AERO nasal inhaler Place 2 sprays into the nose daily. 1 Inhaler 12  . Vitamin D, Ergocalciferol, 2000 units CAPS Take by mouth.     No current facility-administered medications for this visit.    PHYSICAL EXAMINATION: ECOG PERFORMANCE STATUS: 0 - Asymptomatic  Vitals:   09/08/20 0955  BP: (!) 121/49  Pulse: 63  Resp: 18  Temp: 98.5 F (36.9 C)  SpO2: 95%   Filed Weights   09/08/20 0955   Weight: 243 lb 9.6 oz (110.5 kg)    GENERAL:alert, no distress and comfortable NEURO: alert & oriented x 3 with fluent speech, no focal motor/sensory deficits  LABORATORY DATA:  I have reviewed the data as listed    Component Value Date/Time   NA 142 09/08/2020 0925   NA 141 08/23/2017 1026   K 4.0 09/08/2020 0925   K 3.7 08/23/2017 1026   CL 107 09/08/2020 0925   CL 98 02/02/2013 1353   CO2 27 09/08/2020 0925   CO2 33 (H) 08/23/2017 1026   GLUCOSE 90 09/08/2020 0925   GLUCOSE 92 08/23/2017 1026   GLUCOSE 99 02/02/2013 1353   BUN 26 (H) 09/08/2020 0925   BUN 21.0 08/23/2017 1026   CREATININE 1.14 (H) 09/08/2020 0925   CREATININE 1.2 (H) 08/23/2017 1026   CALCIUM 9.2 09/08/2020 0925   CALCIUM 9.3 08/23/2017 1026   PROT 5.9 (L) 09/08/2020 0925   PROT 5.9 (L) 03/28/2020 1044   PROT 6.3 (L) 08/23/2017 1026   ALBUMIN 4.2 09/08/2020 0925   ALBUMIN 3.8 08/23/2017 1026   AST 24 09/08/2020 0925   AST 11 08/23/2017 1026   ALT 15 09/08/2020 0925   ALT 10 08/23/2017 1026   ALKPHOS 67 09/08/2020 0925   ALKPHOS 66 08/23/2017 1026  BILITOT 0.5 09/08/2020 0925   BILITOT 0.59 08/23/2017 1026   GFRNONAA 51 (L) 09/08/2020 0925   GFRAA 49 (L) 07/21/2020 1120    No results found for: SPEP, UPEP  Lab Results  Component Value Date   WBC 30.2 (H) 09/08/2020   NEUTROABS 3.1 09/08/2020   HGB 12.0 09/08/2020   HCT 37.3 09/08/2020   MCV 89.2 09/08/2020   PLT 85 (L) 09/08/2020      Chemistry      Component Value Date/Time   NA 142 09/08/2020 0925   NA 141 08/23/2017 1026   K 4.0 09/08/2020 0925   K 3.7 08/23/2017 1026   CL 107 09/08/2020 0925   CL 98 02/02/2013 1353   CO2 27 09/08/2020 0925   CO2 33 (H) 08/23/2017 1026   BUN 26 (H) 09/08/2020 0925   BUN 21.0 08/23/2017 1026   CREATININE 1.14 (H) 09/08/2020 0925   CREATININE 1.2 (H) 08/23/2017 1026      Component Value Date/Time   CALCIUM 9.2 09/08/2020 0925   CALCIUM 9.3 08/23/2017 1026   ALKPHOS 67 09/08/2020 0925    ALKPHOS 66 08/23/2017 1026   AST 24 09/08/2020 0925   AST 11 08/23/2017 1026   ALT 15 09/08/2020 0925   ALT 10 08/23/2017 1026   BILITOT 0.5 09/08/2020 0925   BILITOT 0.59 08/23/2017 1026

## 2020-09-08 NOTE — Assessment & Plan Note (Signed)
Her renal function has improved/stable Continue close observation

## 2020-09-08 NOTE — Assessment & Plan Note (Signed)
She is receiving monthly vitamin B12 injection Repeat B12 level is improved She will continue monthly B12 injections for now

## 2020-09-08 NOTE — Patient Instructions (Signed)

## 2020-09-09 DIAGNOSIS — M6281 Muscle weakness (generalized): Secondary | ICD-10-CM | POA: Diagnosis not present

## 2020-09-09 DIAGNOSIS — R262 Difficulty in walking, not elsewhere classified: Secondary | ICD-10-CM | POA: Diagnosis not present

## 2020-09-09 DIAGNOSIS — R2681 Unsteadiness on feet: Secondary | ICD-10-CM | POA: Diagnosis not present

## 2020-09-09 MED FILL — CALQUENCE 100 MG CAPSULE: 100 | 30 days supply | Qty: 60 | Fill #1

## 2020-09-15 DIAGNOSIS — M6281 Muscle weakness (generalized): Secondary | ICD-10-CM | POA: Diagnosis not present

## 2020-09-15 DIAGNOSIS — R262 Difficulty in walking, not elsewhere classified: Secondary | ICD-10-CM | POA: Diagnosis not present

## 2020-09-15 DIAGNOSIS — R2681 Unsteadiness on feet: Secondary | ICD-10-CM | POA: Diagnosis not present

## 2020-09-16 DIAGNOSIS — R2681 Unsteadiness on feet: Secondary | ICD-10-CM | POA: Diagnosis not present

## 2020-09-16 DIAGNOSIS — R262 Difficulty in walking, not elsewhere classified: Secondary | ICD-10-CM | POA: Diagnosis not present

## 2020-09-16 DIAGNOSIS — M6281 Muscle weakness (generalized): Secondary | ICD-10-CM | POA: Diagnosis not present

## 2020-09-18 DIAGNOSIS — R2681 Unsteadiness on feet: Secondary | ICD-10-CM | POA: Diagnosis not present

## 2020-09-18 DIAGNOSIS — R262 Difficulty in walking, not elsewhere classified: Secondary | ICD-10-CM | POA: Diagnosis not present

## 2020-09-18 DIAGNOSIS — M6281 Muscle weakness (generalized): Secondary | ICD-10-CM | POA: Diagnosis not present

## 2020-09-19 DIAGNOSIS — R2681 Unsteadiness on feet: Secondary | ICD-10-CM | POA: Diagnosis not present

## 2020-09-19 DIAGNOSIS — R262 Difficulty in walking, not elsewhere classified: Secondary | ICD-10-CM | POA: Diagnosis not present

## 2020-09-19 DIAGNOSIS — M6281 Muscle weakness (generalized): Secondary | ICD-10-CM | POA: Diagnosis not present

## 2020-09-22 DIAGNOSIS — R262 Difficulty in walking, not elsewhere classified: Secondary | ICD-10-CM | POA: Diagnosis not present

## 2020-09-22 DIAGNOSIS — R2681 Unsteadiness on feet: Secondary | ICD-10-CM | POA: Diagnosis not present

## 2020-09-22 DIAGNOSIS — M6281 Muscle weakness (generalized): Secondary | ICD-10-CM | POA: Diagnosis not present

## 2020-09-23 DIAGNOSIS — R262 Difficulty in walking, not elsewhere classified: Secondary | ICD-10-CM | POA: Diagnosis not present

## 2020-09-23 DIAGNOSIS — R2681 Unsteadiness on feet: Secondary | ICD-10-CM | POA: Diagnosis not present

## 2020-09-23 DIAGNOSIS — M6281 Muscle weakness (generalized): Secondary | ICD-10-CM | POA: Diagnosis not present

## 2020-09-24 ENCOUNTER — Other Ambulatory Visit: Payer: Medicare Other

## 2020-09-25 DIAGNOSIS — R262 Difficulty in walking, not elsewhere classified: Secondary | ICD-10-CM | POA: Diagnosis not present

## 2020-09-25 DIAGNOSIS — R2681 Unsteadiness on feet: Secondary | ICD-10-CM | POA: Diagnosis not present

## 2020-09-25 DIAGNOSIS — M6281 Muscle weakness (generalized): Secondary | ICD-10-CM | POA: Diagnosis not present

## 2020-09-30 DIAGNOSIS — R262 Difficulty in walking, not elsewhere classified: Secondary | ICD-10-CM | POA: Diagnosis not present

## 2020-09-30 DIAGNOSIS — M6281 Muscle weakness (generalized): Secondary | ICD-10-CM | POA: Diagnosis not present

## 2020-09-30 DIAGNOSIS — R2681 Unsteadiness on feet: Secondary | ICD-10-CM | POA: Diagnosis not present

## 2020-10-02 DIAGNOSIS — R2681 Unsteadiness on feet: Secondary | ICD-10-CM | POA: Diagnosis not present

## 2020-10-02 DIAGNOSIS — R262 Difficulty in walking, not elsewhere classified: Secondary | ICD-10-CM | POA: Diagnosis not present

## 2020-10-02 DIAGNOSIS — M6281 Muscle weakness (generalized): Secondary | ICD-10-CM | POA: Diagnosis not present

## 2020-10-03 DIAGNOSIS — M6281 Muscle weakness (generalized): Secondary | ICD-10-CM | POA: Diagnosis not present

## 2020-10-03 DIAGNOSIS — R262 Difficulty in walking, not elsewhere classified: Secondary | ICD-10-CM | POA: Diagnosis not present

## 2020-10-03 DIAGNOSIS — R2681 Unsteadiness on feet: Secondary | ICD-10-CM | POA: Diagnosis not present

## 2020-10-06 ENCOUNTER — Other Ambulatory Visit: Payer: Self-pay

## 2020-10-06 ENCOUNTER — Encounter: Payer: Self-pay | Admitting: Hematology and Oncology

## 2020-10-06 ENCOUNTER — Inpatient Hospital Stay: Payer: Medicare Other | Attending: Hematology and Oncology

## 2020-10-06 ENCOUNTER — Inpatient Hospital Stay: Payer: Medicare Other | Admitting: Hematology and Oncology

## 2020-10-06 DIAGNOSIS — D61818 Other pancytopenia: Secondary | ICD-10-CM | POA: Insufficient documentation

## 2020-10-06 DIAGNOSIS — E538 Deficiency of other specified B group vitamins: Secondary | ICD-10-CM | POA: Diagnosis not present

## 2020-10-06 DIAGNOSIS — M6281 Muscle weakness (generalized): Secondary | ICD-10-CM | POA: Diagnosis not present

## 2020-10-06 DIAGNOSIS — N183 Chronic kidney disease, stage 3 unspecified: Secondary | ICD-10-CM

## 2020-10-06 DIAGNOSIS — C9110 Chronic lymphocytic leukemia of B-cell type not having achieved remission: Secondary | ICD-10-CM | POA: Insufficient documentation

## 2020-10-06 DIAGNOSIS — R262 Difficulty in walking, not elsewhere classified: Secondary | ICD-10-CM | POA: Diagnosis not present

## 2020-10-06 DIAGNOSIS — R2681 Unsteadiness on feet: Secondary | ICD-10-CM | POA: Diagnosis not present

## 2020-10-06 LAB — CBC WITH DIFFERENTIAL (CANCER CENTER ONLY)
Abs Immature Granulocytes: 0 10*3/uL (ref 0.00–0.07)
Basophils Absolute: 0 10*3/uL (ref 0.0–0.1)
Basophils Relative: 0 %
Eosinophils Absolute: 0.7 10*3/uL — ABNORMAL HIGH (ref 0.0–0.5)
Eosinophils Relative: 1 %
HCT: 36 % (ref 36.0–46.0)
Hemoglobin: 11.5 g/dL — ABNORMAL LOW (ref 12.0–15.0)
Lymphocytes Relative: 87 %
Lymphs Abs: 59.1 10*3/uL — ABNORMAL HIGH (ref 0.7–4.0)
MCH: 28.3 pg (ref 26.0–34.0)
MCHC: 31.9 g/dL (ref 30.0–36.0)
MCV: 88.5 fL (ref 80.0–100.0)
Monocytes Absolute: 1.4 10*3/uL — ABNORMAL HIGH (ref 0.1–1.0)
Monocytes Relative: 2 %
Neutro Abs: 6.8 10*3/uL (ref 1.7–7.7)
Neutrophils Relative %: 10 %
Platelet Count: 119 10*3/uL — ABNORMAL LOW (ref 150–400)
RBC: 4.07 MIL/uL (ref 3.87–5.11)
RDW: 15.5 % (ref 11.5–15.5)
WBC Count: 67.9 10*3/uL (ref 4.0–10.5)
nRBC: 0 % (ref 0.0–0.2)

## 2020-10-06 LAB — CMP (CANCER CENTER ONLY)
ALT: 9 U/L (ref 0–44)
AST: 12 U/L — ABNORMAL LOW (ref 15–41)
Albumin: 3.8 g/dL (ref 3.5–5.0)
Alkaline Phosphatase: 90 U/L (ref 38–126)
Anion gap: 8 (ref 5–15)
BUN: 29 mg/dL — ABNORMAL HIGH (ref 8–23)
CO2: 28 mmol/L (ref 22–32)
Calcium: 9.6 mg/dL (ref 8.9–10.3)
Chloride: 105 mmol/L (ref 98–111)
Creatinine: 1.4 mg/dL — ABNORMAL HIGH (ref 0.44–1.00)
GFR, Estimated: 39 mL/min — ABNORMAL LOW (ref >60)
Glucose, Bld: 99 mg/dL (ref 70–99)
Potassium: 4.2 mmol/L (ref 3.5–5.1)
Sodium: 141 mmol/L (ref 135–145)
Total Bilirubin: 0.6 mg/dL (ref 0.3–1.2)
Total Protein: 6.2 g/dL — ABNORMAL LOW (ref 6.5–8.1)

## 2020-10-06 NOTE — Assessment & Plan Note (Signed)
She is receiving monthly vitamin B12 injection Repeat B12 level is improved She will continue monthly B12 injections for now

## 2020-10-06 NOTE — Assessment & Plan Note (Signed)
Her renal function has improved/stable Continue close observation

## 2020-10-06 NOTE — Assessment & Plan Note (Signed)
This is likely due to recent treatment. The patient denies recent history of bleeding such as epistaxis, hematuria or hematochezia. She is asymptomatic from the low platelet count. I will observe for now.  she does not require transfusion now. I will continue the chemotherapy at current dose without dosage adjustment.

## 2020-10-06 NOTE — Progress Notes (Signed)
[place] OFFICE PROGRESS NOTE  Patient Care Team: Jonathon Jordan, MD as PCP - General (Family Medicine) Heath Lark, MD as Referring Physician (Hematology and Oncology)  ASSESSMENT & PLAN:  CLL (chronic lymphocytic leukemia) ([place]) Overall, she tolerated treatment well The lymphocytosis and thrombocytopenia are expected side effects of treatment I will see her again in 4 weeks for further follow-up  Pancytopenia, acquired [facility]) This is likely due to recent treatment. The patient denies recent history of bleeding such as epistaxis, hematuria or hematochezia. She is asymptomatic from the low platelet count. I will observe for now.  she does not require transfusion now. I will continue the chemotherapy at current dose without dosage adjustment.   CKD (chronic kidney disease), symptom management only, stage 3 (moderate) Her renal function has improved/stable Continue close observation  Vitamin B12 deficiency She is receiving monthly vitamin B12 injection Repeat B12 level is improved She will continue monthly B12 injections for now   No orders of the defined types were placed in this encounter.   All questions were answered. The patient knows to call the clinic with any problems, questions or concerns. The total time spent in the appointment was 20 minutes encounter with patients including review of chart and various tests results, discussions about plan of care and coordination of care plan   Heath Lark, MD 10/06/2020 3:07 PM  INTERVAL HISTORY: Please see below for problem oriented charting. She returns for further follow-up with her sister She is doing well She has lost a lot of weight through dietary changes and exercise She noticed some intermittent bruising The patient denies any recent signs or symptoms of bleeding such as spontaneous epistaxis, hematuria or hematochezia. Her chronic cough is stable  SUMMARY OF ONCOLOGIC HISTORY: Oncology History  Overview Note  FISH del 13 q   CLL (chronic lymphocytic leukemia) ([place])  07/02/2010 Procedure   LN biopsy confirmed CLL   08/18/2010 Procedure   Bone marrow biopsy confirmed CLL   11/18/2010 - 04/15/2011 Chemotherapy   patient received 6 cycles of FLudarabine and Rituximab   01/13/2012 Relapse/Recurrence   Repeat BM biopsy confirmed relapse   01/27/2012 - 06/23/2012 Chemotherapy   Bendamustine & Rituximab given, complicated by infusion reaction to Rituximab, completed 6 cycles   06/29/2013 Imaging   Ct scan confirmed disease relapse with bulky lymphadenopathy   07/23/2013 Procedure   Repeat BM biopsy due to thrombocytopenia and progressive leukocytosis   08/02/2013 Relapse/Recurrence   Patient consented to start iburitinib as salvage therapy for relapsed CLL   11/09/2013 Imaging   Repeat CT scan the chest, abdomen and pelvis showed greater than 50% response to treatment   06/17/2014 Imaging   Repeat CT scan showed near complete response to treatment.   05/30/2015 Imaging   Repeat CT scan showed near complete response to treatment.   06/14/2016 Imaging   CT scan showed stable scattered small retroperitoneal lymph nodes and pelvic lymph nodes. No findings for recurrent or progressive lymphoma. Stable mild splenomegaly.   12/30/2017 Imaging   1. Recurrent adenopathy within the chest, abdomen, and pelvis. 2. Splenomegaly. 3. Aortic Atherosclerosis (ICD10-I70.0). 4. Coronary artery calcifications.   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 The catheter is ready for immediate use.   01/24/2018 -  Chemotherapy   The patient had Gazyva and 1 dose of bendamustine. Bendamustine was discontinued due  to inability to get insurance approval   05/08/2018 Imaging   1. Interval partial treatment response. Bilateral axillary, mediastinal, right  hilar, retroperitoneal and bilateral pelvic adenopathy is all decreased. Mild splenomegaly, decreased. No new or progressive disease. 2. Aortic Atherosclerosis (ICD10-I70.0).   01/31/2020 Imaging   1. Since 12/29/2017, marked progression of adenopathy within the chest, abdomen, and pelvis as detailed above. 2. Progressive splenomegaly, consistent with splenic involvement. 3. New pulmonary parenchymal findings which could represent interval infection (including atypical etiologies) or aspiration. 4. Coronary artery atherosclerosis. Aortic Atherosclerosis (ICD10-I70.0).   02/07/2020 - 07/22/2020 Chemotherapy   The patient bendamustine for chemotherapy treatment.     04/28/2020 Imaging   Chest Impression:   1. New consolidation in the LEFT upper lobe is concerning for pneumonia versus lymphoma. Recommend clinical correlation with pulmonary infection. Favor recurrent pulmonary infection in light of RIGHT lung infection demonstrated on CT January 31, 2020. 2. Resolution of ground-glass densities in the RIGHT upper lobe and consolidation in the RIGHT lower lobe consistent resolved pulmonary infection. 3. Persistent enlarged axillary, mediastinal, and hilar lymph nodes.  Overall adenopathy is mildly improved.   Abdomen / Pelvis Impression:   1. Improved bulky retroperitoneal and iliac lymphadenopathy. The large lymph nodes are measurably decreased in size although remain bulky. 2. Marked splenomegaly also improved.   08/11/2020 Imaging   1. Disease progression, as evidenced by increase in adenopathy within the chest, abdomen, and pelvis. Persistent hepatosplenomegaly, with probable splenic involvement. 2. Although the left upper lobe consolidation has resolved, there are new areas of pulmonary opacity which could represent pulmonary lymphoma or concurrent pneumonia. 3. Coronary artery atherosclerosis. Aortic Atherosclerosis   08/18/2020 -  Chemotherapy   The patient had acalabrutinib for  chemotherapy treatment.       REVIEW OF SYSTEMS:   Constitutional: Denies fevers, chills or abnormal weight loss Eyes: Denies blurriness of vision Ears, nose, mouth, throat, and face: Denies mucositis or sore throat Cardiovascular: Denies palpitation, chest discomfort or lower extremity swelling Gastrointestinal:  Denies nausea, heartburn or change in bowel habits Skin: Denies abnormal skin rashes Lymphatics: Denies new lymphadenopathy Neurological:Denies numbness, tingling or new weaknesses Behavioral/Psych: Mood is stable, no new changes  All other systems were reviewed with the patient and are negative.  I have reviewed the past medical history, past surgical history, social history and family history with the patient and they are unchanged from previous note.  ALLERGIES:  is allergic to azithromycin and ibuprofen.  MEDICATIONS:  Current Outpatient Medications  Medication Sig Dispense Refill  . acalabrutinib (CALQUENCE) 100 MG capsule Take 1 capsule (100 mg total) by mouth 2 (two) times daily. 60 capsule 11  . acetaminophen (TYLENOL) 325 MG tablet Take 650 mg by mouth every 6 (six) hours as needed.    Marland Kitchen acyclovir (ZOVIRAX) 400 MG tablet Take 1 tablet (400 mg total) by mouth daily. 30 tablet 5  . albuterol (VENTOLIN HFA) 108 (90 Base) MCG/ACT inhaler Inhale 2 puffs into the lungs in the morning and at bedtime.    Marland Kitchen allopurinol (ZYLOPRIM) 300 MG tablet Take 1 tablet (300 mg total) by mouth daily. 30 tablet 0  . amLODipine (NORVASC) 10 MG tablet TAKE 1 TABLET (10 MG TOTAL) BY MOUTH DAILY. 90 tablet 1  . azelastine (ASTELIN) 0.1 % nasal spray Place 1 spray into both nostrils 2 (two) times daily. Use in each nostril as directed    . bisoprolol (ZEBETA) 5 MG tablet Take 10 mg by mouth daily.   10  . Carboxymethylcellul-Glycerin (REFRESH  OPTIVE SENSITIVE OP) Place 1 drop into both eyes as needed.     . citalopram (CELEXA) 20 MG tablet Take 20 mg by mouth every morning.     .  fluticasone-salmeterol (ADVAIR HFA) 115-21 MCG/ACT inhaler Inhale 2 puffs into the lungs 2 (two) times daily.    Marland Kitchen gabapentin (NEURONTIN) 300 MG capsule Take 300 mg by mouth at bedtime.    . hydrochlorothiazide (HYDRODIURIL) 25 MG tablet Take 25 mg by mouth daily.    Marland Kitchen levocetirizine (XYZAL) 5 MG tablet Take 5 mg by mouth every evening.    . lidocaine-prilocaine (EMLA) cream Apply to affected area once 30 g 5  . losartan-hydrochlorothiazide (HYZAAR) 100-25 MG tablet Take by mouth daily.   9  . Multiple Vitamins-Minerals (PRESERVISION AREDS 2) CAPS Take 1 capsule by mouth 2 (two) times daily.    . ondansetron (ZOFRAN) 8 MG tablet Take 1 tablet (8 mg total) by mouth 2 (two) times daily. Start second day after chemotherapy. Then take as needed for nausea or vomiting. 30 tablet 1  . prochlorperazine (COMPAZINE) 10 MG tablet Take 1 tablet (10 mg total) by mouth every 6 (six) hours as needed (Nausea or vomiting). 30 tablet 1  . sulfamethoxazole-trimethoprim (BACTRIM DS) 800-160 MG tablet Take 1 tablet by mouth 3 (three) times a week. 12 tablet 5  . triamcinolone (NASACORT) 55 MCG/ACT AERO nasal inhaler Place 2 sprays into the nose daily. 1 Inhaler 12  . Vitamin D, Ergocalciferol, 2000 units CAPS Take by mouth.     No current facility-administered medications for this visit.    PHYSICAL EXAMINATION: ECOG PERFORMANCE STATUS: 1 - Symptomatic but completely ambulatory  Vitals:   10/06/20 1146  BP: (!) 127/50  Pulse: 71  Resp: 18  Temp: 98.9 F (37.2 C)  SpO2: 95%   Filed Weights   10/06/20 1146  Weight: 230 lb 9.6 oz (104.6 kg)    GENERAL:alert, no distress and comfortable SKIN: Noted skin bruises  EYES: normal, Conjunctiva are pink and non-injected, sclera clear OROPHARYNX:no exudate, no erythema and lips, buccal mucosa, and tongue normal  NECK: supple, thyroid normal size, non-tender, without nodularity LYMPH:  no palpable lymphadenopathy in the cervical, axillary or inguinal LUNGS:  clear to auscultation and percussion with normal breathing effort HEART: regular rate & rhythm and no murmurs and no lower extremity edema ABDOMEN:abdomen soft, non-tender and normal bowel sounds Musculoskeletal:no cyanosis of digits and no clubbing  NEURO: alert & oriented x 3 with fluent speech, no focal motor/sensory deficits  LABORATORY DATA:  I have reviewed the data as listed    Component Value Date/Time   NA 141 10/06/2020 1135   NA 141 08/23/2017 1026   K 4.2 10/06/2020 1135   K 3.7 08/23/2017 1026   CL 105 10/06/2020 1135   CL 98 02/02/2013 1353   CO2 28 10/06/2020 1135   CO2 33 (H) 08/23/2017 1026   GLUCOSE 99 10/06/2020 1135   GLUCOSE 92 08/23/2017 1026   GLUCOSE 99 02/02/2013 1353   BUN 29 (H) 10/06/2020 1135   BUN 21.0 08/23/2017 1026   CREATININE 1.40 (H) 10/06/2020 1135   CREATININE 1.2 (H) 08/23/2017 1026   CALCIUM 9.6 10/06/2020 1135   CALCIUM 9.3 08/23/2017 1026   PROT 6.2 (L) 10/06/2020 1135   PROT 5.9 (L) 03/28/2020 1044   PROT 6.3 (L) 08/23/2017 1026   ALBUMIN 3.8 10/06/2020 1135   ALBUMIN 3.8 08/23/2017 1026   AST 12 (L) 10/06/2020 1135   AST 11 08/23/2017 1026  ALT 9 10/06/2020 1135   ALT 10 08/23/2017 1026   ALKPHOS 90 10/06/2020 1135   ALKPHOS 66 08/23/2017 1026   BILITOT 0.6 10/06/2020 1135   BILITOT 0.59 08/23/2017 1026   GFRNONAA 39 (L) 10/06/2020 1135   GFRAA 49 (L) 07/21/2020 1120    No results found for: SPEP, UPEP  Lab Results  Component Value Date   WBC 67.9 (HH) 10/06/2020   NEUTROABS 6.8 10/06/2020   HGB 11.5 (L) 10/06/2020   HCT 36.0 10/06/2020   MCV 88.5 10/06/2020   PLT 119 (L) 10/06/2020      Chemistry      Component Value Date/Time   NA 141 10/06/2020 1135   NA 141 08/23/2017 1026   K 4.2 10/06/2020 1135   K 3.7 08/23/2017 1026   CL 105 10/06/2020 1135   CL 98 02/02/2013 1353   CO2 28 10/06/2020 1135   CO2 33 (H) 08/23/2017 1026   BUN 29 (H) 10/06/2020 1135   BUN 21.0 08/23/2017 1026   CREATININE 1.40 (H)  10/06/2020 1135   CREATININE 1.2 (H) 08/23/2017 1026      Component Value Date/Time   CALCIUM 9.6 10/06/2020 1135   CALCIUM 9.3 08/23/2017 1026   ALKPHOS 90 10/06/2020 1135   ALKPHOS 66 08/23/2017 1026   AST 12 (L) 10/06/2020 1135   AST 11 08/23/2017 1026   ALT 9 10/06/2020 1135   ALT 10 08/23/2017 1026   BILITOT 0.6 10/06/2020 1135   BILITOT 0.59 08/23/2017 1026

## 2020-10-06 NOTE — Assessment & Plan Note (Signed)
Overall, she tolerated treatment well The lymphocytosis and thrombocytopenia are expected side effects of treatment I will see her again in 4 weeks for further follow-up

## 2020-10-07 DIAGNOSIS — R262 Difficulty in walking, not elsewhere classified: Secondary | ICD-10-CM | POA: Diagnosis not present

## 2020-10-07 DIAGNOSIS — M6281 Muscle weakness (generalized): Secondary | ICD-10-CM | POA: Diagnosis not present

## 2020-10-07 DIAGNOSIS — R2681 Unsteadiness on feet: Secondary | ICD-10-CM | POA: Diagnosis not present

## 2020-10-09 DIAGNOSIS — M6281 Muscle weakness (generalized): Secondary | ICD-10-CM | POA: Diagnosis not present

## 2020-10-09 DIAGNOSIS — R2681 Unsteadiness on feet: Secondary | ICD-10-CM | POA: Diagnosis not present

## 2020-10-09 DIAGNOSIS — R262 Difficulty in walking, not elsewhere classified: Secondary | ICD-10-CM | POA: Diagnosis not present

## 2020-10-09 MED FILL — CALQUENCE 100 MG CAPSULE: 100 | 30 days supply | Qty: 60 | Fill #2

## 2020-10-14 DIAGNOSIS — R2681 Unsteadiness on feet: Secondary | ICD-10-CM | POA: Diagnosis not present

## 2020-10-14 DIAGNOSIS — M6281 Muscle weakness (generalized): Secondary | ICD-10-CM | POA: Diagnosis not present

## 2020-10-14 DIAGNOSIS — R262 Difficulty in walking, not elsewhere classified: Secondary | ICD-10-CM | POA: Diagnosis not present

## 2020-10-15 DIAGNOSIS — R262 Difficulty in walking, not elsewhere classified: Secondary | ICD-10-CM | POA: Diagnosis not present

## 2020-10-15 DIAGNOSIS — R2681 Unsteadiness on feet: Secondary | ICD-10-CM | POA: Diagnosis not present

## 2020-10-15 DIAGNOSIS — M6281 Muscle weakness (generalized): Secondary | ICD-10-CM | POA: Diagnosis not present

## 2020-10-16 DIAGNOSIS — R262 Difficulty in walking, not elsewhere classified: Secondary | ICD-10-CM | POA: Diagnosis not present

## 2020-10-16 DIAGNOSIS — R2681 Unsteadiness on feet: Secondary | ICD-10-CM | POA: Diagnosis not present

## 2020-10-16 DIAGNOSIS — M6281 Muscle weakness (generalized): Secondary | ICD-10-CM | POA: Diagnosis not present

## 2020-10-20 DIAGNOSIS — M6281 Muscle weakness (generalized): Secondary | ICD-10-CM | POA: Diagnosis not present

## 2020-10-20 DIAGNOSIS — R262 Difficulty in walking, not elsewhere classified: Secondary | ICD-10-CM | POA: Diagnosis not present

## 2020-10-20 DIAGNOSIS — R2681 Unsteadiness on feet: Secondary | ICD-10-CM | POA: Diagnosis not present

## 2020-10-21 DIAGNOSIS — R2681 Unsteadiness on feet: Secondary | ICD-10-CM | POA: Diagnosis not present

## 2020-10-21 DIAGNOSIS — M6281 Muscle weakness (generalized): Secondary | ICD-10-CM | POA: Diagnosis not present

## 2020-10-21 DIAGNOSIS — R262 Difficulty in walking, not elsewhere classified: Secondary | ICD-10-CM | POA: Diagnosis not present

## 2020-10-22 ENCOUNTER — Other Ambulatory Visit: Payer: Medicare Other

## 2020-10-23 DIAGNOSIS — R2681 Unsteadiness on feet: Secondary | ICD-10-CM | POA: Diagnosis not present

## 2020-10-23 DIAGNOSIS — M6281 Muscle weakness (generalized): Secondary | ICD-10-CM | POA: Diagnosis not present

## 2020-10-23 DIAGNOSIS — R262 Difficulty in walking, not elsewhere classified: Secondary | ICD-10-CM | POA: Diagnosis not present

## 2020-10-27 DIAGNOSIS — M6281 Muscle weakness (generalized): Secondary | ICD-10-CM | POA: Diagnosis not present

## 2020-10-27 DIAGNOSIS — R262 Difficulty in walking, not elsewhere classified: Secondary | ICD-10-CM | POA: Diagnosis not present

## 2020-10-27 DIAGNOSIS — R2681 Unsteadiness on feet: Secondary | ICD-10-CM | POA: Diagnosis not present

## 2020-10-28 DIAGNOSIS — R2681 Unsteadiness on feet: Secondary | ICD-10-CM | POA: Diagnosis not present

## 2020-10-28 DIAGNOSIS — M6281 Muscle weakness (generalized): Secondary | ICD-10-CM | POA: Diagnosis not present

## 2020-10-28 DIAGNOSIS — R262 Difficulty in walking, not elsewhere classified: Secondary | ICD-10-CM | POA: Diagnosis not present

## 2020-10-30 DIAGNOSIS — R262 Difficulty in walking, not elsewhere classified: Secondary | ICD-10-CM | POA: Diagnosis not present

## 2020-10-30 DIAGNOSIS — R2681 Unsteadiness on feet: Secondary | ICD-10-CM | POA: Diagnosis not present

## 2020-10-30 DIAGNOSIS — M6281 Muscle weakness (generalized): Secondary | ICD-10-CM | POA: Diagnosis not present

## 2020-10-31 ENCOUNTER — Telehealth: Payer: Self-pay | Admitting: Hematology and Oncology

## 2020-10-31 NOTE — Telephone Encounter (Signed)
Rescheduled from 1/17. Called pts sister confirmed 1/21 appts

## 2020-11-03 ENCOUNTER — Other Ambulatory Visit: Payer: Medicare Other

## 2020-11-03 ENCOUNTER — Ambulatory Visit: Payer: Medicare Other | Admitting: Hematology and Oncology

## 2020-11-04 DIAGNOSIS — R262 Difficulty in walking, not elsewhere classified: Secondary | ICD-10-CM | POA: Diagnosis not present

## 2020-11-04 DIAGNOSIS — M6281 Muscle weakness (generalized): Secondary | ICD-10-CM | POA: Diagnosis not present

## 2020-11-04 DIAGNOSIS — R2681 Unsteadiness on feet: Secondary | ICD-10-CM | POA: Diagnosis not present

## 2020-11-05 DIAGNOSIS — R262 Difficulty in walking, not elsewhere classified: Secondary | ICD-10-CM | POA: Diagnosis not present

## 2020-11-05 DIAGNOSIS — R2681 Unsteadiness on feet: Secondary | ICD-10-CM | POA: Diagnosis not present

## 2020-11-05 DIAGNOSIS — M6281 Muscle weakness (generalized): Secondary | ICD-10-CM | POA: Diagnosis not present

## 2020-11-05 MED FILL — CALQUENCE 100 MG CAPSULE: 100 | 30 days supply | Qty: 60 | Fill #3

## 2020-11-06 DIAGNOSIS — R2681 Unsteadiness on feet: Secondary | ICD-10-CM | POA: Diagnosis not present

## 2020-11-06 DIAGNOSIS — M6281 Muscle weakness (generalized): Secondary | ICD-10-CM | POA: Diagnosis not present

## 2020-11-06 DIAGNOSIS — R262 Difficulty in walking, not elsewhere classified: Secondary | ICD-10-CM | POA: Diagnosis not present

## 2020-11-07 ENCOUNTER — Ambulatory Visit: Payer: Medicare Other | Admitting: Hematology and Oncology

## 2020-11-07 ENCOUNTER — Other Ambulatory Visit: Payer: Medicare Other

## 2020-11-10 ENCOUNTER — Other Ambulatory Visit: Payer: Self-pay

## 2020-11-10 ENCOUNTER — Ambulatory Visit
Admission: RE | Admit: 2020-11-10 | Discharge: 2020-11-10 | Disposition: A | Discharge status: Home / Self Care | Payer: Medicare Other | Source: Ambulatory Visit | Attending: Family Medicine | Admitting: Family Medicine

## 2020-11-10 ENCOUNTER — Ambulatory Visit: Payer: Medicare Other

## 2020-11-10 DIAGNOSIS — R928 Other abnormal and inconclusive findings on diagnostic imaging of breast: Secondary | ICD-10-CM | POA: Diagnosis not present

## 2020-11-10 DIAGNOSIS — R922 Inconclusive mammogram: Secondary | ICD-10-CM | POA: Diagnosis not present

## 2020-11-11 ENCOUNTER — Inpatient Hospital Stay: Payer: Medicare Other | Admitting: Hematology and Oncology

## 2020-11-11 ENCOUNTER — Encounter: Payer: Self-pay | Admitting: Hematology and Oncology

## 2020-11-11 ENCOUNTER — Inpatient Hospital Stay: Payer: Medicare Other | Attending: Hematology and Oncology

## 2020-11-11 ENCOUNTER — Encounter: Payer: Self-pay | Admitting: *Deleted

## 2020-11-11 ENCOUNTER — Other Ambulatory Visit: Payer: Self-pay

## 2020-11-11 ENCOUNTER — Inpatient Hospital Stay: Payer: Medicare Other

## 2020-11-11 ENCOUNTER — Ambulatory Visit (HOSPITAL_COMMUNITY)
Admission: RE | Admit: 2020-11-11 | Discharge: 2020-11-11 | Disposition: A | Discharge status: Home / Self Care | Payer: Medicare Other | Source: Ambulatory Visit | Attending: Hematology and Oncology | Admitting: Hematology and Oncology

## 2020-11-11 VITALS — BP 144/58 | HR 73 | Temp 98.6°F | Resp 18 | Ht 62.0 in | Wt 226.4 lb

## 2020-11-11 DIAGNOSIS — J849 Interstitial pulmonary disease, unspecified: Secondary | ICD-10-CM

## 2020-11-11 DIAGNOSIS — R059 Cough, unspecified: Secondary | ICD-10-CM | POA: Diagnosis not present

## 2020-11-11 DIAGNOSIS — E538 Deficiency of other specified B group vitamins: Secondary | ICD-10-CM | POA: Diagnosis not present

## 2020-11-11 DIAGNOSIS — C9110 Chronic lymphocytic leukemia of B-cell type not having achieved remission: Secondary | ICD-10-CM | POA: Insufficient documentation

## 2020-11-11 DIAGNOSIS — D696 Thrombocytopenia, unspecified: Secondary | ICD-10-CM | POA: Diagnosis not present

## 2020-11-11 DIAGNOSIS — N183 Chronic kidney disease, stage 3 unspecified: Secondary | ICD-10-CM | POA: Insufficient documentation

## 2020-11-11 DIAGNOSIS — R21 Rash and other nonspecific skin eruption: Secondary | ICD-10-CM | POA: Diagnosis not present

## 2020-11-11 LAB — CMP (CANCER CENTER ONLY)
ALT: 8 U/L (ref 0–44)
AST: 13 U/L — ABNORMAL LOW (ref 15–41)
Albumin: 3.9 g/dL (ref 3.5–5.0)
Alkaline Phosphatase: 95 U/L (ref 38–126)
Anion gap: 9 (ref 5–15)
BUN: 26 mg/dL — ABNORMAL HIGH (ref 8–23)
CO2: 26 mmol/L (ref 22–32)
Calcium: 9.2 mg/dL (ref 8.9–10.3)
Chloride: 106 mmol/L (ref 98–111)
Creatinine: 1.47 mg/dL — ABNORMAL HIGH (ref 0.44–1.00)
GFR, Estimated: 37 mL/min — ABNORMAL LOW (ref >60)
Glucose, Bld: 103 mg/dL — ABNORMAL HIGH (ref 70–99)
Potassium: 4.4 mmol/L (ref 3.5–5.1)
Sodium: 141 mmol/L (ref 135–145)
Total Bilirubin: 0.5 mg/dL (ref 0.3–1.2)
Total Protein: 6 g/dL — ABNORMAL LOW (ref 6.5–8.1)

## 2020-11-11 LAB — CBC WITH DIFFERENTIAL (CANCER CENTER ONLY)
Abs Immature Granulocytes: 0 10*3/uL (ref 0.00–0.07)
Basophils Absolute: 0 10*3/uL (ref 0.0–0.1)
Basophils Relative: 0 %
Eosinophils Absolute: 1.2 10*3/uL — ABNORMAL HIGH (ref 0.0–0.5)
Eosinophils Relative: 1 %
HCT: 34.8 % — ABNORMAL LOW (ref 36.0–46.0)
Hemoglobin: 10.3 g/dL — ABNORMAL LOW (ref 12.0–15.0)
Lymphocytes Relative: 92 %
Lymphs Abs: 109.3 10*3/uL — ABNORMAL HIGH (ref 0.7–4.0)
MCH: 27.5 pg (ref 26.0–34.0)
MCHC: 29.6 g/dL — ABNORMAL LOW (ref 30.0–36.0)
MCV: 92.8 fL (ref 80.0–100.0)
Monocytes Absolute: 3.6 10*3/uL — ABNORMAL HIGH (ref 0.1–1.0)
Monocytes Relative: 3 %
Neutro Abs: 4.8 10*3/uL (ref 1.7–7.7)
Neutrophils Relative %: 4 %
Platelet Count: 122 10*3/uL — ABNORMAL LOW (ref 150–400)
RBC: 3.75 MIL/uL — ABNORMAL LOW (ref 3.87–5.11)
RDW: 17.2 % — ABNORMAL HIGH (ref 11.5–15.5)
WBC Count: 118.8 10*3/uL (ref 4.0–10.5)
nRBC: 0 % (ref 0.0–0.2)

## 2020-11-11 MED ORDER — CEFDINIR 300 MG PO CAPS: 300.0000 mg | ORAL_CAPSULE | Freq: Two times a day (BID) | ORAL | 0 refills | Status: DC

## 2020-11-11 MED ORDER — CYANOCOBALAMIN 1000 MCG/ML IJ SOLN
INTRAMUSCULAR | Status: AC
  Filled 2020-11-11: qty 1

## 2020-11-11 MED ORDER — HEPARIN SOD (PORK) LOCK FLUSH 100 UNIT/ML IV SOLN
500.0000 [IU] | Freq: Once | INTRAVENOUS | Status: AC
  Administered 2020-11-11: 500 [IU]
  Filled 2020-11-11: qty 5

## 2020-11-11 MED ORDER — SODIUM CHLORIDE 0.9% FLUSH
10.0000 mL | Freq: Once | INTRAVENOUS | Status: AC
  Administered 2020-11-11: 10 mL
  Filled 2020-11-11: qty 10

## 2020-11-11 MED ORDER — CYANOCOBALAMIN 1000 MCG/ML IJ SOLN
1000.0000 ug | Freq: Once | INTRAMUSCULAR | Status: AC
  Administered 2020-11-11: 1000 ug via INTRAMUSCULAR

## 2020-11-11 MED ORDER — PREDNISONE 20 MG PO TABS: 40.0000 mg | ORAL_TABLET | Freq: Every day | ORAL | 0 refills | Status: DC

## 2020-11-11 NOTE — Assessment & Plan Note (Signed)
She has received vitamin B12 injections in the past Recheck vitamin B12 level was adequate

## 2020-11-11 NOTE — Progress Notes (Signed)
CRITICAL VALUE STICKER  CRITICAL VALUE: WBC 118.8  RECEIVER (on-site recipient of call):Rorey Hodges, [place] NOTIFIED: 11/11/20 at 1343  MESSENGER (representative from lab): HIllary  MD NOTIFIED: Dr. Alvy Bimler TIME OF NOTIFICATION: [id number]   RESPONSE: Patient in office being seen by provider now.

## 2020-11-11 NOTE — Assessment & Plan Note (Signed)
Overall, the extreme leukocytosis and is not unusual with Calquence She has mild progressive anemia but that would not be the cause of her significant shortness of breath I recommend we continue treatment without changes Her thrombocytopenia is improving I will see her next week for further follow-up

## 2020-11-11 NOTE — Assessment & Plan Note (Signed)
She has history of chronic thrombocytopenia, improving Observe closely for now

## 2020-11-11 NOTE — Assessment & Plan Note (Signed)
Her previous CT imaging show persistent bilateral pulmonary infiltrate She is symptomatic with cough and shortness of breath Previously, we have discussed pulmonologist referral but her sister has declined I recommend chest x-ray today Given her symptoms, I recommend a course of prednisone along with antibiotics I will see her next week for further follow-up With her social situation, her sister will pick up the prescriptions I have written a letter with instruction for the group home to give her her medications

## 2020-11-11 NOTE — Patient Instructions (Signed)

## 2020-11-12 ENCOUNTER — Telehealth: Payer: Self-pay

## 2020-11-12 DIAGNOSIS — R262 Difficulty in walking, not elsewhere classified: Secondary | ICD-10-CM | POA: Diagnosis not present

## 2020-11-12 DIAGNOSIS — R2681 Unsteadiness on feet: Secondary | ICD-10-CM | POA: Diagnosis not present

## 2020-11-12 DIAGNOSIS — M6281 Muscle weakness (generalized): Secondary | ICD-10-CM | POA: Diagnosis not present

## 2020-11-12 NOTE — Telephone Encounter (Signed)
Spoke with Almon Register concerning CXR advised to get covid testing completed and to please call us back with results

## 2020-11-12 NOTE — Assessment & Plan Note (Signed)
Her renal function has improved/stable Continue close observation 

## 2020-11-12 NOTE — Progress Notes (Signed)
[facility] OFF[facility] PROGRESS NOTE  Patient Care Team: Jonathon Jordan, MD as PCP - General (Family Medicine) Heath Lark, MD as Referring Physician (Hematology and Oncology)  ASSESSMENT & PLAN:  CLL (chronic lymphocytic leukemia) ([facility]) Overall, the extreme leukocytosis and is not unusual with [place] She has mild progressive anemia but that would not be the cause of her significant shortness of breath I recommend we continue treatment without changes Her thrombocytopenia is improving I will see her next week for further follow-up  Bilateral interstitial pneumonia ([place]) Her previous CT imaging show persistent bilateral pulmonary infiltrate She is symptomatic with cough and shortness of breath Previously, we have discussed pulmonologist referral but her sister has declined I recommend chest x-ray today Given her symptoms, I recommend a course of prednisone along with antibiotics I will see her next week for further follow-up With her social situation, her sister will pick up the prescriptions I have written a letter with instruction for the group home to give her her medications  Thrombocytopenia, unspecified ([place]) She has history of chronic thrombocytopenia, improving Observe closely for now  Vitamin B12 deficiency She has received vitamin B12 injections in the past Recheck vitamin B12 level was adequate  CKD (chronic kidney disease), symptom management only, stage 3 (moderate) Her renal function has improved/stable Continue close observation   Orders Placed This Encounter  Procedures  . DG Chest 2 View    Standing Status:   Future    Number of Occurrences:   1    Standing Expiration Date:   11/11/2021    Order Specific Question:   Reason for exam:    Answer:   cough, CLL    Order Specific Question:   Preferred imaging location?    Answer:   [facility]    All questions were answered. The patient knows to call the clinic with any problems,  questions or concerns. The total time spent in the appointment was 40 minutes encounter with patients including review of chart and various tests results, discussions about plan of care and coordination of care plan   Heath Lark, MD 11/12/2020 3:23 PM  INTERVAL HISTORY: Please see below for problem oriented charting. She returns with her sister for further follow-up Overall, she feels well except for fatigue and shortness of breath Her chronic cough is not better No fever or chills She denies recent bruising She is eating healthier and has lost some weight and felt better  SUMMARY OF ONCOLOGIC HISTORY: Oncology History Overview Note  FISH del 13 q   CLL (chronic lymphocytic leukemia) ([place])  07/02/2010 Procedure   LN biopsy confirmed CLL   08/18/2010 Procedure   Bone marrow biopsy confirmed CLL   11/18/2010 - 04/15/2011 Chemotherapy   patient received 6 cycles of FLudarabine and Rituximab   01/13/2012 Relapse/Recurrence   Repeat BM biopsy confirmed relapse   01/27/2012 - 06/23/2012 Chemotherapy   Bendamustine & Rituximab given, complicated by infusion reaction to Rituximab, completed 6 cycles   06/29/2013 Imaging   Ct scan confirmed disease relapse with bulky lymphadenopathy   07/23/2013 Procedure   Repeat BM biopsy due to thrombocytopenia and progressive leukocytosis   08/02/2013 Relapse/Recurrence   Patient consented to start iburitinib as salvage therapy for relapsed CLL   11/09/2013 Imaging   Repeat CT scan the chest, abdomen and pelvis showed greater than 50% response to treatment   06/17/2014 Imaging   Repeat CT scan showed near complete response to treatment.   05/30/2015 Imaging   Repeat CT  scan showed near complete response to treatment.   06/14/2016 Imaging   CT scan showed stable scattered small retroperitoneal lymph nodes and pelvic lymph nodes. No findings for recurrent or progressive lymphoma. Stable mild splenomegaly.   12/30/2017 Imaging   1. Recurrent  adenopathy within the chest, abdomen, and pelvis. 2. Splenomegaly. 3. Aortic Atherosclerosis (ICD10-I70.0). 4. Coronary artery calcifications.   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 The catheter is ready for immediate use.   01/24/2018 -  Chemotherapy   The patient had Gazyva and 1 dose of bendamustine. Bendamustine was discontinued due to inability to get insurance approval   05/08/2018 Imaging   1. Interval partial treatment response. Bilateral axillary, mediastinal, right hilar, retroperitoneal and bilateral pelvic adenopathy is all decreased. Mild splenomegaly, decreased. No new or progressive disease. 2. Aortic Atherosclerosis (ICD10-I70.0).   01/31/2020 Imaging   1. Since 12/29/2017, marked progression of adenopathy within the chest, abdomen, and pelvis as detailed above. 2. Progressive splenomegaly, consistent with splenic involvement. 3. New pulmonary parenchymal findings which could represent interval infection (including atypical etiologies) or aspiration. 4. Coronary artery atherosclerosis. Aortic Atherosclerosis (ICD10-I70.0).   02/07/2020 - 07/22/2020 Chemotherapy   The patient bendamustine for chemotherapy treatment.     04/28/2020 Imaging   Chest Impression:   1. New consolidation in the LEFT upper lobe is concerning for pneumonia versus lymphoma. Recommend clinical correlation with pulmonary infection. Favor recurrent pulmonary infection in light of RIGHT lung infection demonstrated on CT January 31, 2020. 2. Resolution of ground-glass densities in the RIGHT upper lobe and consolidation in the RIGHT lower lobe consistent resolved pulmonary infection. 3. Persistent enlarged axillary, mediastinal, and hilar lymph nodes.  Overall adenopathy is mildly improved.   Abdomen / Pelvis Impression:   1. Improved bulky  retroperitoneal and iliac lymphadenopathy. The large lymph nodes are measurably decreased in size although remain bulky. 2. Marked splenomegaly also improved.   08/11/2020 Imaging   1. Disease progression, as evidenced by increase in adenopathy within the chest, abdomen, and pelvis. Persistent hepatosplenomegaly, with probable splenic involvement. 2. Although the left upper lobe consolidation has resolved, there are new areas of pulmonary opacity which could represent pulmonary lymphoma or concurrent pneumonia. 3. Coronary artery atherosclerosis. Aortic Atherosclerosis   08/18/2020 -  Chemotherapy   The patient had acalabrutinib for chemotherapy treatment.       REVIEW OF SYSTEMS:   Constitutional: Denies fevers, chills or abnormal weight loss Eyes: Denies blurriness of vision Ears, nose, mouth, throat, and face: Denies mucositis or sore throat Cardiovascular: Denies palpitation, chest discomfort or lower extremity swelling Gastrointestinal:  Denies nausea, heartburn or change in bowel habits Skin: Denies abnormal skin rashes Lymphatics: Denies new lymphadenopathy or easy bruising Neurological:Denies numbness, tingling or new weaknesses Behavioral/Psych: Mood is stable, no new changes  All other systems were reviewed with the patient and are negative.  I have reviewed the past medical history, past surgical history, social history and family history with the patient and they are unchanged from previous note.  ALLERGIES:  is allergic to azithromycin and ibuprofen.  MEDICATIONS:  Current Outpatient Medications  Medication Sig Dispense Refill  . cefdinir (OMNICEF) 300 MG capsule Take 1 capsule (300 mg total) by mouth 2 (two) times daily. 14 capsule 0  . predniSONE (DELTASONE) 20 MG tablet Take 2 tablets (40 mg total) by mouth daily with breakfast. 14 tablet  0  . acalabrutinib (CALQUENCE) 100 MG capsule Take 1 capsule (100 mg total) by mouth 2 (two) times daily. 60 capsule 11  .  acetaminophen (TYLENOL) 325 MG tablet Take 650 mg by mouth every 6 (six) hours as needed.    Marland Kitchen acyclovir (ZOVIRAX) 400 MG tablet Take 1 tablet (400 mg total) by mouth daily. 30 tablet 5  . albuterol (VENTOLIN HFA) 108 (90 Base) MCG/ACT inhaler Inhale 2 puffs into the lungs in the morning and at bedtime.    Marland Kitchen allopurinol (ZYLOPRIM) 300 MG tablet Take 1 tablet (300 mg total) by mouth daily. 30 tablet 0  . amLODipine (NORVASC) 10 MG tablet TAKE 1 TABLET (10 MG TOTAL) BY MOUTH DAILY. 90 tablet 1  . azelastine (ASTELIN) 0.1 % nasal spray Place 1 spray into both nostrils 2 (two) times daily. Use in each nostril as directed    . bisoprolol (ZEBETA) 5 MG tablet Take 10 mg by mouth daily.   10  . Carboxymethylcellul-Glycerin (REFRESH OPTIVE SENSITIVE OP) Place 1 drop into both eyes as needed.     . citalopram (CELEXA) 20 MG tablet Take 20 mg by mouth every morning.     . fluticasone-salmeterol (ADVAIR HFA) 115-21 MCG/ACT inhaler Inhale 2 puffs into the lungs 2 (two) times daily.    Marland Kitchen gabapentin (NEURONTIN) 300 MG capsule Take 300 mg by mouth at bedtime.    . hydrochlorothiazide (HYDRODIURIL) 25 MG tablet Take 25 mg by mouth daily.    Marland Kitchen levocetirizine (XYZAL) 5 MG tablet Take 5 mg by mouth every evening.    . lidocaine-prilocaine (EMLA) cream Apply to affected area once 30 g 5  . losartan-hydrochlorothiazide (HYZAAR) 100-25 MG tablet Take by mouth daily.   9  . Multiple Vitamins-Minerals (PRESERVISION AREDS 2) CAPS Take 1 capsule by mouth 2 (two) times daily.    . ondansetron (ZOFRAN) 8 MG tablet Take 1 tablet (8 mg total) by mouth 2 (two) times daily. Start second day after chemotherapy. Then take as needed for nausea or vomiting. 30 tablet 1  . prochlorperazine (COMPAZINE) 10 MG tablet Take 1 tablet (10 mg total) by mouth every 6 (six) hours as needed (Nausea or vomiting). 30 tablet 1  . sulfamethoxazole-trimethoprim (BACTRIM DS) 800-160 MG tablet Take 1 tablet by mouth 3 (three) times a week. 12 tablet 5   . triamcinolone (NASACORT) 55 MCG/ACT AERO nasal inhaler Place 2 sprays into the nose daily. 1 Inhaler 12  . Vitamin D, Ergocalciferol, 2000 units CAPS Take by mouth.     No current facility-administered medications for this visit.    PHYSICAL EXAMINATION: ECOG PERFORMANCE STATUS: 1 - Symptomatic but completely ambulatory  Vitals:   11/11/20 1336  BP: (!) 144/58  Pulse: 73  Resp: 18  Temp: 98.6 F (37 C)  SpO2: 90%   Filed Weights   11/11/20 1336  Weight: 226 lb 6.4 oz (102.7 kg)    GENERAL:alert, no distress and comfortable SKIN: skin color, texture, turgor are normal, no rashes or significant lesions EYES: normal, Conjunctiva are pink and non-injected, sclera clear OROPHARYNX:no exudate, no erythema and lips, buccal mucosa, and tongue normal  NECK: supple, thyroid normal size, non-tender, without nodularity LYMPH:  no palpable lymphadenopathy in the cervical, axillary or inguinal LUNGS: clear to auscultation and percussion with normal breathing effort HEART: regular rate & rhythm and no murmurs and no lower extremity edema ABDOMEN:abdomen soft, non-tender and normal bowel sounds Musculoskeletal:no cyanosis of digits and no clubbing  NEURO: alert & oriented x 3 with  fluent speech, no focal motor/sensory deficits  LABORATORY DATA:  I have reviewed the data as listed    Component Value Date/Time   NA 141 11/11/2020 1320   NA 141 08/23/2017 1026   K 4.4 11/11/2020 1320   K 3.7 08/23/2017 1026   CL 106 11/11/2020 1320   CL 98 02/02/2013 1353   CO2 26 11/11/2020 1320   CO2 33 (H) 08/23/2017 1026   GLUCOSE 103 (H) 11/11/2020 1320   GLUCOSE 92 08/23/2017 1026   GLUCOSE 99 02/02/2013 1353   BUN 26 (H) 11/11/2020 1320   BUN 21.0 08/23/2017 1026   CREATININE 1.47 (H) 11/11/2020 1320   CREATININE 1.2 (H) 08/23/2017 1026   CALCIUM 9.2 11/11/2020 1320   CALCIUM 9.3 08/23/2017 1026   PROT 6.0 (L) 11/11/2020 1320   PROT 5.9 (L) 03/28/2020 1044   PROT 6.3 (L) 08/23/2017  1026   ALBUMIN 3.9 11/11/2020 1320   ALBUMIN 3.8 08/23/2017 1026   AST 13 (L) 11/11/2020 1320   AST 11 08/23/2017 1026   ALT 8 11/11/2020 1320   ALT 10 08/23/2017 1026   ALKPHOS 95 11/11/2020 1320   ALKPHOS 66 08/23/2017 1026   BILITOT 0.5 11/11/2020 1320   BILITOT 0.59 08/23/2017 1026   GFRNONAA 37 (L) 11/11/2020 1320   GFRAA 49 (L) 07/21/2020 1120    No results found for: SPEP, UPEP  Lab Results  Component Value Date   WBC 118.8 (HH) 11/11/2020   NEUTROABS 4.8 11/11/2020   HGB 10.3 (L) 11/11/2020   HCT 34.8 (L) 11/11/2020   MCV 92.8 11/11/2020   PLT 122 (L) 11/11/2020      Chemistry      Component Value Date/Time   NA 141 11/11/2020 1320   NA 141 08/23/2017 1026   K 4.4 11/11/2020 1320   K 3.7 08/23/2017 1026   CL 106 11/11/2020 1320   CL 98 02/02/2013 1353   CO2 26 11/11/2020 1320   CO2 33 (H) 08/23/2017 1026   BUN 26 (H) 11/11/2020 1320   BUN 21.0 08/23/2017 1026   CREATININE 1.47 (H) 11/11/2020 1320   CREATININE 1.2 (H) 08/23/2017 1026      Component Value Date/Time   CALCIUM 9.2 11/11/2020 1320   CALCIUM 9.3 08/23/2017 1026   ALKPHOS 95 11/11/2020 1320   ALKPHOS 66 08/23/2017 1026   AST 13 (L) 11/11/2020 1320   AST 11 08/23/2017 1026   ALT 8 11/11/2020 1320   ALT 10 08/23/2017 1026   BILITOT 0.5 11/11/2020 1320   BILITOT 0.59 08/23/2017 1026       RADIOGRAPHIC STUDIES: I have personally reviewed the radiological images as listed and agreed with the findings in the report. DG Chest 2 View  Result Date: 11/11/2020 CLINICAL DATA:  75 year old female with cough. Chronic lymphocytic leukemia. EXAM: CHEST - 2 VIEW COMPARISON:  Chest radiograph dated 03/24/2018 FINDINGS: Port-A-Cath in similar position. Bilateral streaky and nodular densities most consistent with developing infiltrate, likely viral or atypical in etiology including COVID-19. Clinical correlation is recommended. The cardiac silhouette is within limits. Atherosclerotic calcification of the  aorta. Right hilar mass/adenopathy measuring approximately 5 cm in craniocaudal length. No acute osseous pathology. Degenerative changes of the spine. IMPRESSION: 1. Findings most consistent with developing infiltrate. 2. Right hilar mass/adenopathy in keeping with history of CLL. CT may provide better evaluation if clinically indicated. Electronically Signed   By: Anner Crete M.D.   On: 11/11/2020 20:52   MM DIAG BREAST TOMO UNI LEFT  Result Date: 11/10/2020  CLINICAL DATA:  Callback from screening mammogram for possible asymmetry left breast EXAM: DIGITAL DIAGNOSTIC UNILATERAL LEFT MAMMOGRAM WITH TOMO AND CAD TECHNIQUE: Left digital diagnostic mammography and breast tomosynthesis was performed. Digital images of the breasts were evaluated with computer-aided detection. COMPARISON:  Prior films ACR Breast Density Category b: There are scattered areas of fibroglandular density. FINDINGS: Cc and MLO views of the left breast are submitted. Previously questioned asymmetry in the medial left breast does not persist on additional views. No suspicious abnormality is identified. IMPRESSION: Benign findings. RECOMMENDATION: Routine screening mammogram back on schedule. I have discussed the findings and recommendations with the patient. If applicable, a reminder letter will be sent to the patient regarding the next appointment. BI-RADS CATEGORY  2: Benign. Electronically Signed   By: Abelardo Diesel M.D.   On: 11/10/2020 10:27

## 2020-11-13 ENCOUNTER — Telehealth: Payer: Self-pay

## 2020-11-13 DIAGNOSIS — R2681 Unsteadiness on feet: Secondary | ICD-10-CM | POA: Diagnosis not present

## 2020-11-13 DIAGNOSIS — R262 Difficulty in walking, not elsewhere classified: Secondary | ICD-10-CM | POA: Diagnosis not present

## 2020-11-13 DIAGNOSIS — M6281 Muscle weakness (generalized): Secondary | ICD-10-CM | POA: Diagnosis not present

## 2020-11-13 NOTE — Telephone Encounter (Signed)
Called back. Per sister, she was tested for covid when she went back to [facility] on 1/25. Covid test negative. She is taking antibiotic and steroids as instructed.  FYI

## 2020-11-13 NOTE — Telephone Encounter (Signed)
Sister called and left a message to call her.  Called back and left a message asking her to call the office back.

## 2020-11-14 DIAGNOSIS — R2681 Unsteadiness on feet: Secondary | ICD-10-CM | POA: Diagnosis not present

## 2020-11-14 DIAGNOSIS — M6281 Muscle weakness (generalized): Secondary | ICD-10-CM | POA: Diagnosis not present

## 2020-11-14 DIAGNOSIS — R262 Difficulty in walking, not elsewhere classified: Secondary | ICD-10-CM | POA: Diagnosis not present

## 2020-11-17 DIAGNOSIS — R262 Difficulty in walking, not elsewhere classified: Secondary | ICD-10-CM | POA: Diagnosis not present

## 2020-11-17 DIAGNOSIS — R2681 Unsteadiness on feet: Secondary | ICD-10-CM | POA: Diagnosis not present

## 2020-11-17 DIAGNOSIS — M6281 Muscle weakness (generalized): Secondary | ICD-10-CM | POA: Diagnosis not present

## 2020-11-19 DIAGNOSIS — R2681 Unsteadiness on feet: Secondary | ICD-10-CM | POA: Diagnosis not present

## 2020-11-19 DIAGNOSIS — M6281 Muscle weakness (generalized): Secondary | ICD-10-CM | POA: Diagnosis not present

## 2020-11-19 DIAGNOSIS — R262 Difficulty in walking, not elsewhere classified: Secondary | ICD-10-CM | POA: Diagnosis not present

## 2020-11-20 ENCOUNTER — Encounter: Payer: Self-pay | Admitting: Hematology and Oncology

## 2020-11-20 ENCOUNTER — Inpatient Hospital Stay: Payer: Medicare Other | Attending: Hematology and Oncology | Admitting: Hematology and Oncology

## 2020-11-20 ENCOUNTER — Telehealth: Payer: Self-pay | Admitting: Hematology and Oncology

## 2020-11-20 ENCOUNTER — Other Ambulatory Visit: Payer: Self-pay

## 2020-11-20 VITALS — BP 126/49 | HR 68 | Temp 96.8°F | Resp 22 | Ht 62.0 in | Wt 222.0 lb

## 2020-11-20 DIAGNOSIS — D61810 Antineoplastic chemotherapy induced pancytopenia: Secondary | ICD-10-CM | POA: Insufficient documentation

## 2020-11-20 DIAGNOSIS — J849 Interstitial pulmonary disease, unspecified: Secondary | ICD-10-CM

## 2020-11-20 DIAGNOSIS — E538 Deficiency of other specified B group vitamins: Secondary | ICD-10-CM | POA: Diagnosis not present

## 2020-11-20 DIAGNOSIS — C9110 Chronic lymphocytic leukemia of B-cell type not having achieved remission: Secondary | ICD-10-CM | POA: Diagnosis not present

## 2020-11-20 NOTE — Telephone Encounter (Signed)
Scheduled appts per 2/2 los. Pt declined print out of AVS.

## 2020-11-20 NOTE — Assessment & Plan Note (Signed)
Clinically, her breathing is improved She is afebrile Oxygen saturation is good She has completed a course of antibiotics and prednisone therapy As above, we discussed the risk and benefits of further imaging study and for now, she is in agreement to hold off until her next visit in 2 weeks

## 2020-11-20 NOTE — Assessment & Plan Note (Signed)
Her recent blood count is better She will receive another B12 injection in her next visit

## 2020-11-20 NOTE — Assessment & Plan Note (Signed)
I have reviewed recent chest x-ray with the patient and her sister Overall, she is improving We discussed the risk and benefits of CT imaging versus active surveillance She is in agreement for surveillance again in 2 weeks with repeat blood work and physical exam If she is not improved, we will proceed with CT imaging next visit She will continue her treatment for now

## 2020-11-20 NOTE — Progress Notes (Signed)
[facility] OFFICE PROGRESS NOTE  Patient Care Team: Jonathon Jordan, MD as PCP - General (Family Medicine) Heath Lark, MD as Referring Physician (Hematology and Oncology)  ASSESSMENT & PLAN:  CLL (chronic lymphocytic leukemia) ([place]) I have reviewed recent chest x-ray with the patient and her sister Overall, she is improving We discussed the risk and benefits of CT imaging versus active surveillance She is in agreement for surveillance again in 2 weeks with repeat blood work and physical exam If she is not improved, we will proceed with CT imaging next visit She will continue her treatment for now  Bilateral interstitial pneumonia ([place]) Clinically, her breathing is improved She is afebrile Oxygen saturation is good She has completed a course of antibiotics and prednisone therapy As above, we discussed the risk and benefits of further imaging study and for now, she is in agreement to hold off until her next visit in 2 weeks  Vitamin B12 deficiency Her recent blood count is better She will receive another B12 injection in her next visit   No orders of the defined types were placed in this encounter.   All questions were answered. The patient knows to call the clinic with any problems, questions or concerns. The total time spent in the appointment was 20 minutes encounter with patients including review of chart and various tests results, discussions about plan of care and coordination of care plan   Heath Lark, MD 11/20/2020 1:44 PM  INTERVAL HISTORY: Please see below for problem oriented charting. She returns for further follow-up Her breathing has slightly improved and she has less cough since I placed her on prednisone and antibiotics She has been afebrile She has tested negative for Covid infection She has lost a bit of weight She noted some skin bruising  SUMMARY OF ONCOLOGIC HISTORY: Oncology History Overview Note  FISH del 13 q   CLL (chronic  lymphocytic leukemia) ([place])  07/02/2010 Procedure   LN biopsy confirmed CLL   08/18/2010 Procedure   Bone marrow biopsy confirmed CLL   11/18/2010 - 04/15/2011 Chemotherapy   patient received 6 cycles of FLudarabine and Rituximab   01/13/2012 Relapse/Recurrence   Repeat BM biopsy confirmed relapse   01/27/2012 - 06/23/2012 Chemotherapy   Bendamustine & Rituximab given, complicated by infusion reaction to Rituximab, completed 6 cycles   06/29/2013 Imaging   Ct scan confirmed disease relapse with bulky lymphadenopathy   07/23/2013 Procedure   Repeat BM biopsy due to thrombocytopenia and progressive leukocytosis   08/02/2013 Relapse/Recurrence   Patient consented to start iburitinib as salvage therapy for relapsed CLL   11/09/2013 Imaging   Repeat CT scan the chest, abdomen and pelvis showed greater than 50% response to treatment   06/17/2014 Imaging   Repeat CT scan showed near complete response to treatment.   05/30/2015 Imaging   Repeat CT scan showed near complete response to treatment.   06/14/2016 Imaging   CT scan showed stable scattered small retroperitoneal lymph nodes and pelvic lymph nodes. No findings for recurrent or progressive lymphoma. Stable mild splenomegaly.   12/30/2017 Imaging   1. Recurrent adenopathy within the chest, abdomen, and pelvis. 2. Splenomegaly. 3. Aortic Atherosclerosis (ICD10-I70.0). 4. Coronary artery calcifications.   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 The catheter is ready for immediate use.   01/24/2018 -  Chemotherapy   The patient  had Gazyva and 1 dose of bendamustine. Bendamustine was discontinued due to inability to get insurance approval   05/08/2018 Imaging   1. Interval partial treatment response. Bilateral axillary, mediastinal, right hilar, retroperitoneal and bilateral pelvic  adenopathy is all decreased. Mild splenomegaly, decreased. No new or progressive disease. 2. Aortic Atherosclerosis (ICD10-I70.0).   01/31/2020 Imaging   1. Since 12/29/2017, marked progression of adenopathy within the chest, abdomen, and pelvis as detailed above. 2. Progressive splenomegaly, consistent with splenic involvement. 3. New pulmonary parenchymal findings which could represent interval infection (including atypical etiologies) or aspiration. 4. Coronary artery atherosclerosis. Aortic Atherosclerosis (ICD10-I70.0).   02/07/2020 - 07/22/2020 Chemotherapy   The patient bendamustine for chemotherapy treatment.     04/28/2020 Imaging   Chest Impression:   1. New consolidation in the LEFT upper lobe is concerning for pneumonia versus lymphoma. Recommend clinical correlation with pulmonary infection. Favor recurrent pulmonary infection in light of RIGHT lung infection demonstrated on CT January 31, 2020. 2. Resolution of ground-glass densities in the RIGHT upper lobe and consolidation in the RIGHT lower lobe consistent resolved pulmonary infection. 3. Persistent enlarged axillary, mediastinal, and hilar lymph nodes.  Overall adenopathy is mildly improved.   Abdomen / Pelvis Impression:   1. Improved bulky retroperitoneal and iliac lymphadenopathy. The large lymph nodes are measurably decreased in size although remain bulky. 2. Marked splenomegaly also improved.   08/11/2020 Imaging   1. Disease progression, as evidenced by increase in adenopathy within the chest, abdomen, and pelvis. Persistent hepatosplenomegaly, with probable splenic involvement. 2. Although the left upper lobe consolidation has resolved, there are new areas of pulmonary opacity which could represent pulmonary lymphoma or concurrent pneumonia. 3. Coronary artery atherosclerosis. Aortic Atherosclerosis   08/18/2020 -  Chemotherapy   The patient had acalabrutinib for chemotherapy treatment.       REVIEW OF SYSTEMS:    Constitutional: Denies fevers, chills  Eyes: Denies blurriness of vision Ears, nose, mouth, throat, and face: Denies mucositis or sore throat Cardiovascular: Denies palpitation, chest discomfort or lower extremity swelling Gastrointestinal:  Denies nausea, heartburn or change in bowel habits Skin: Denies abnormal skin rashes Lymphatics: Denies new lymphadenopathy  Neurological:Denies numbness, tingling or new weaknesses Behavioral/Psych: Mood is stable, no new changes  All other systems were reviewed with the patient and are negative.  I have reviewed the past medical history, past surgical history, social history and family history with the patient and they are unchanged from previous note.  ALLERGIES:  is allergic to azithromycin and ibuprofen.  MEDICATIONS:  Current Outpatient Medications  Medication Sig Dispense Refill  . acalabrutinib (CALQUENCE) 100 MG capsule Take 1 capsule (100 mg total) by mouth 2 (two) times daily. 60 capsule 11  . acetaminophen (TYLENOL) 325 MG tablet Take 650 mg by mouth every 6 (six) hours as needed.    Marland Kitchen acyclovir (ZOVIRAX) 400 MG tablet Take 1 tablet (400 mg total) by mouth daily. 30 tablet 5  . albuterol (VENTOLIN HFA) 108 (90 Base) MCG/ACT inhaler Inhale 2 puffs into the lungs in the morning and at bedtime.    Marland Kitchen allopurinol (ZYLOPRIM) 300 MG tablet Take 1 tablet (300 mg total) by mouth daily. 30 tablet 0  . amLODipine (NORVASC) 10 MG tablet TAKE 1 TABLET (10 MG TOTAL) BY MOUTH DAILY. 90 tablet 1  . azelastine (ASTELIN) 0.1 % nasal spray Place 1 spray into both nostrils 2 (two) times daily. Use in each nostril as directed    . bisoprolol (ZEBETA) 5 MG tablet Take 10 mg by  mouth daily.   10  . Carboxymethylcellul-Glycerin (REFRESH OPTIVE SENSITIVE OP) Place 1 drop into both eyes as needed.     . cefdinir (OMNICEF) 300 MG capsule Take 1 capsule (300 mg total) by mouth 2 (two) times daily. 14 capsule 0  . citalopram (CELEXA) 20 MG tablet Take 20 mg by  mouth every morning.     . fluticasone-salmeterol (ADVAIR HFA) 115-21 MCG/ACT inhaler Inhale 2 puffs into the lungs 2 (two) times daily.    Marland Kitchen gabapentin (NEURONTIN) 300 MG capsule Take 300 mg by mouth at bedtime.    . hydrochlorothiazide (HYDRODIURIL) 25 MG tablet Take 25 mg by mouth daily.    Marland Kitchen levocetirizine (XYZAL) 5 MG tablet Take 5 mg by mouth every evening.    . lidocaine-prilocaine (EMLA) cream Apply to affected area once 30 g 5  . losartan-hydrochlorothiazide (HYZAAR) 100-25 MG tablet Take by mouth daily.   9  . Multiple Vitamins-Minerals (PRESERVISION AREDS 2) CAPS Take 1 capsule by mouth 2 (two) times daily.    . ondansetron (ZOFRAN) 8 MG tablet Take 1 tablet (8 mg total) by mouth 2 (two) times daily. Start second day after chemotherapy. Then take as needed for nausea or vomiting. 30 tablet 1  . predniSONE (DELTASONE) 20 MG tablet Take 2 tablets (40 mg total) by mouth daily with breakfast. 14 tablet 0  . prochlorperazine (COMPAZINE) 10 MG tablet Take 1 tablet (10 mg total) by mouth every 6 (six) hours as needed (Nausea or vomiting). 30 tablet 1  . sulfamethoxazole-trimethoprim (BACTRIM DS) 800-160 MG tablet Take 1 tablet by mouth 3 (three) times a week. 12 tablet 5  . triamcinolone (NASACORT) 55 MCG/ACT AERO nasal inhaler Place 2 sprays into the nose daily. 1 Inhaler 12  . Vitamin D, Ergocalciferol, 2000 units CAPS Take by mouth.     No current facility-administered medications for this visit.    PHYSICAL EXAMINATION: ECOG PERFORMANCE STATUS: 1 - Symptomatic but completely ambulatory  Vitals:   11/20/20 1317  BP: (!) 126/49  Pulse: 68  Resp: (!) 22  Temp: (!) 96.8 F (36 C)  SpO2: 95%   Filed Weights   11/20/20 1317  Weight: 222 lb (100.7 kg)    GENERAL:alert, no distress and comfortable SKIN: Noted skin bruising EYES: normal, Conjunctiva are pink and non-injected, sclera clear OROPHARYNX:no exudate, no erythema and lips, buccal mucosa, and tongue normal  NECK:  supple, thyroid normal size, non-tender, without nodularity LYMPH:  no palpable lymphadenopathy in the cervical, axillary or inguinal LUNGS: clear to auscultation and percussion with normal breathing effort HEART: regular rate & rhythm and no murmurs and no lower extremity edema ABDOMEN:abdomen soft, non-tender and normal bowel sounds Musculoskeletal:no cyanosis of digits and no clubbing  NEURO: alert & oriented x 3 with fluent speech, no focal motor/sensory deficits  LABORATORY DATA:  I have reviewed the data as listed    Component Value Date/Time   NA 141 11/11/2020 1320   NA 141 08/23/2017 1026   K 4.4 11/11/2020 1320   K 3.7 08/23/2017 1026   CL 106 11/11/2020 1320   CL 98 02/02/2013 1353   CO2 26 11/11/2020 1320   CO2 33 (H) 08/23/2017 1026   GLUCOSE 103 (H) 11/11/2020 1320   GLUCOSE 92 08/23/2017 1026   GLUCOSE 99 02/02/2013 1353   BUN 26 (H) 11/11/2020 1320   BUN 21.0 08/23/2017 1026   CREATININE 1.47 (H) 11/11/2020 1320   CREATININE 1.2 (H) 08/23/2017 1026   CALCIUM 9.2 11/11/2020 1320  CALCIUM 9.3 08/23/2017 1026   PROT 6.0 (L) 11/11/2020 1320   PROT 5.9 (L) 03/28/2020 1044   PROT 6.3 (L) 08/23/2017 1026   ALBUMIN 3.9 11/11/2020 1320   ALBUMIN 3.8 08/23/2017 1026   AST 13 (L) 11/11/2020 1320   AST 11 08/23/2017 1026   ALT 8 11/11/2020 1320   ALT 10 08/23/2017 1026   ALKPHOS 95 11/11/2020 1320   ALKPHOS 66 08/23/2017 1026   BILITOT 0.5 11/11/2020 1320   BILITOT 0.59 08/23/2017 1026   GFRNONAA 37 (L) 11/11/2020 1320   GFRAA 49 (L) 07/21/2020 1120    No results found for: SPEP, UPEP  Lab Results  Component Value Date   WBC 118.8 (HH) 11/11/2020   NEUTROABS 4.8 11/11/2020   HGB 10.3 (L) 11/11/2020   HCT 34.8 (L) 11/11/2020   MCV 92.8 11/11/2020   PLT 122 (L) 11/11/2020      Chemistry      Component Value Date/Time   NA 141 11/11/2020 1320   NA 141 08/23/2017 1026   K 4.4 11/11/2020 1320   K 3.7 08/23/2017 1026   CL 106 11/11/2020 1320   CL 98  02/02/2013 1353   CO2 26 11/11/2020 1320   CO2 33 (H) 08/23/2017 1026   BUN 26 (H) 11/11/2020 1320   BUN 21.0 08/23/2017 1026   CREATININE 1.47 (H) 11/11/2020 1320   CREATININE 1.2 (H) 08/23/2017 1026      Component Value Date/Time   CALCIUM 9.2 11/11/2020 1320   CALCIUM 9.3 08/23/2017 1026   ALKPHOS 95 11/11/2020 1320   ALKPHOS 66 08/23/2017 1026   AST 13 (L) 11/11/2020 1320   AST 11 08/23/2017 1026   ALT 8 11/11/2020 1320   ALT 10 08/23/2017 1026   BILITOT 0.5 11/11/2020 1320   BILITOT 0.59 08/23/2017 1026       RADIOGRAPHIC STUDIES: I have reviewed chest x-ray with the patient and sister I have personally reviewed the radiological images as listed and agreed with the findings in the report. DG Chest 2 View  Result Date: 11/11/2020 CLINICAL DATA:  75 year old female with cough. Chronic lymphocytic leukemia. EXAM: CHEST - 2 VIEW COMPARISON:  Chest radiograph dated 03/24/2018 FINDINGS: Port-A-Cath in similar position. Bilateral streaky and nodular densities most consistent with developing infiltrate, likely viral or atypical in etiology including COVID-19. Clinical correlation is recommended. The cardiac silhouette is within limits. Atherosclerotic calcification of the aorta. Right hilar mass/adenopathy measuring approximately 5 cm in craniocaudal length. No acute osseous pathology. Degenerative changes of the spine. IMPRESSION: 1. Findings most consistent with developing infiltrate. 2. Right hilar mass/adenopathy in keeping with history of CLL. CT may provide better evaluation if clinically indicated. Electronically Signed   By: Anner Crete M.D.   On: 11/11/2020 20:52   MM DIAG BREAST TOMO UNI LEFT  Result Date: 11/10/2020 CLINICAL DATA:  Callback from screening mammogram for possible asymmetry left breast EXAM: DIGITAL DIAGNOSTIC UNILATERAL LEFT MAMMOGRAM WITH TOMO AND CAD TECHNIQUE: Left digital diagnostic mammography and breast tomosynthesis was performed. Digital images of  the breasts were evaluated with computer-aided detection. COMPARISON:  Prior films ACR Breast Density Category b: There are scattered areas of fibroglandular density. FINDINGS: Cc and MLO views of the left breast are submitted. Previously questioned asymmetry in the medial left breast does not persist on additional views. No suspicious abnormality is identified. IMPRESSION: Benign findings. RECOMMENDATION: Routine screening mammogram back on schedule. I have discussed the findings and recommendations with the patient. If applicable, a reminder letter will be sent  to the patient regarding the next appointment. BI-RADS CATEGORY  2: Benign. Electronically Signed   By: Abelardo Diesel M.D.   On: 11/10/2020 10:27

## 2020-11-21 DIAGNOSIS — R2681 Unsteadiness on feet: Secondary | ICD-10-CM | POA: Diagnosis not present

## 2020-11-21 DIAGNOSIS — M6281 Muscle weakness (generalized): Secondary | ICD-10-CM | POA: Diagnosis not present

## 2020-11-21 DIAGNOSIS — R262 Difficulty in walking, not elsewhere classified: Secondary | ICD-10-CM | POA: Diagnosis not present

## 2020-11-24 DIAGNOSIS — R2681 Unsteadiness on feet: Secondary | ICD-10-CM | POA: Diagnosis not present

## 2020-11-24 DIAGNOSIS — R262 Difficulty in walking, not elsewhere classified: Secondary | ICD-10-CM | POA: Diagnosis not present

## 2020-11-24 DIAGNOSIS — M6281 Muscle weakness (generalized): Secondary | ICD-10-CM | POA: Diagnosis not present

## 2020-11-25 DIAGNOSIS — M6281 Muscle weakness (generalized): Secondary | ICD-10-CM | POA: Diagnosis not present

## 2020-11-25 DIAGNOSIS — R2681 Unsteadiness on feet: Secondary | ICD-10-CM | POA: Diagnosis not present

## 2020-11-25 DIAGNOSIS — R262 Difficulty in walking, not elsewhere classified: Secondary | ICD-10-CM | POA: Diagnosis not present

## 2020-11-27 DIAGNOSIS — M6281 Muscle weakness (generalized): Secondary | ICD-10-CM | POA: Diagnosis not present

## 2020-11-27 DIAGNOSIS — R2681 Unsteadiness on feet: Secondary | ICD-10-CM | POA: Diagnosis not present

## 2020-11-27 DIAGNOSIS — R262 Difficulty in walking, not elsewhere classified: Secondary | ICD-10-CM | POA: Diagnosis not present

## 2020-12-01 DIAGNOSIS — R262 Difficulty in walking, not elsewhere classified: Secondary | ICD-10-CM | POA: Diagnosis not present

## 2020-12-01 DIAGNOSIS — M6281 Muscle weakness (generalized): Secondary | ICD-10-CM | POA: Diagnosis not present

## 2020-12-01 DIAGNOSIS — R2681 Unsteadiness on feet: Secondary | ICD-10-CM | POA: Diagnosis not present

## 2020-12-02 DIAGNOSIS — R2681 Unsteadiness on feet: Secondary | ICD-10-CM | POA: Diagnosis not present

## 2020-12-02 DIAGNOSIS — M6281 Muscle weakness (generalized): Secondary | ICD-10-CM | POA: Diagnosis not present

## 2020-12-02 DIAGNOSIS — R262 Difficulty in walking, not elsewhere classified: Secondary | ICD-10-CM | POA: Diagnosis not present

## 2020-12-03 ENCOUNTER — Encounter: Payer: Self-pay | Admitting: Hematology and Oncology

## 2020-12-03 ENCOUNTER — Inpatient Hospital Stay: Payer: Medicare Other | Admitting: Hematology and Oncology

## 2020-12-03 ENCOUNTER — Encounter (HOSPITAL_COMMUNITY): Payer: Self-pay

## 2020-12-03 ENCOUNTER — Inpatient Hospital Stay: Payer: Medicare Other

## 2020-12-03 ENCOUNTER — Emergency Department (HOSPITAL_COMMUNITY): Payer: Medicare Other

## 2020-12-03 ENCOUNTER — Inpatient Hospital Stay (HOSPITAL_COMMUNITY)
Admission: EM | Admit: 2020-12-03 | Discharge: 2020-12-09 | DRG: 193 | Disposition: A | Discharge status: Skilled Nursing Facility | Payer: Medicare Other | Source: Ambulatory Visit | Attending: Internal Medicine | Admitting: Internal Medicine

## 2020-12-03 ENCOUNTER — Other Ambulatory Visit: Payer: Self-pay

## 2020-12-03 DIAGNOSIS — R0902 Hypoxemia: Secondary | ICD-10-CM | POA: Diagnosis not present

## 2020-12-03 DIAGNOSIS — Z9851 Tubal ligation status: Secondary | ICD-10-CM

## 2020-12-03 DIAGNOSIS — D61818 Other pancytopenia: Secondary | ICD-10-CM | POA: Diagnosis present

## 2020-12-03 DIAGNOSIS — I2699 Other pulmonary embolism without acute cor pulmonale: Secondary | ICD-10-CM | POA: Diagnosis not present

## 2020-12-03 DIAGNOSIS — M1990 Unspecified osteoarthritis, unspecified site: Secondary | ICD-10-CM | POA: Diagnosis present

## 2020-12-03 DIAGNOSIS — J8489 Other specified interstitial pulmonary diseases: Secondary | ICD-10-CM | POA: Diagnosis not present

## 2020-12-03 DIAGNOSIS — I5033 Acute on chronic diastolic (congestive) heart failure: Secondary | ICD-10-CM

## 2020-12-03 DIAGNOSIS — I5031 Acute diastolic (congestive) heart failure: Secondary | ICD-10-CM | POA: Diagnosis present

## 2020-12-03 DIAGNOSIS — Z888 Allergy status to other drugs, medicaments and biological substances status: Secondary | ICD-10-CM | POA: Diagnosis not present

## 2020-12-03 DIAGNOSIS — J189 Pneumonia, unspecified organism: Secondary | ICD-10-CM | POA: Diagnosis not present

## 2020-12-03 DIAGNOSIS — Z90710 Acquired absence of both cervix and uterus: Secondary | ICD-10-CM | POA: Diagnosis not present

## 2020-12-03 DIAGNOSIS — I130 Hypertensive heart and chronic kidney disease with heart failure and stage 1 through stage 4 chronic kidney disease, or unspecified chronic kidney disease: Secondary | ICD-10-CM | POA: Diagnosis present

## 2020-12-03 DIAGNOSIS — J9811 Atelectasis: Secondary | ICD-10-CM | POA: Diagnosis not present

## 2020-12-03 DIAGNOSIS — E8770 Fluid overload, unspecified: Secondary | ICD-10-CM

## 2020-12-03 DIAGNOSIS — I10 Essential (primary) hypertension: Secondary | ICD-10-CM | POA: Diagnosis not present

## 2020-12-03 DIAGNOSIS — J849 Interstitial pulmonary disease, unspecified: Secondary | ICD-10-CM | POA: Diagnosis not present

## 2020-12-03 DIAGNOSIS — N183 Chronic kidney disease, stage 3 unspecified: Secondary | ICD-10-CM | POA: Diagnosis present

## 2020-12-03 DIAGNOSIS — F819 Developmental disorder of scholastic skills, unspecified: Secondary | ICD-10-CM | POA: Diagnosis not present

## 2020-12-03 DIAGNOSIS — F32A Depression, unspecified: Secondary | ICD-10-CM | POA: Diagnosis not present

## 2020-12-03 DIAGNOSIS — Z7189 Other specified counseling: Secondary | ICD-10-CM | POA: Diagnosis not present

## 2020-12-03 DIAGNOSIS — Z20822 Contact with and (suspected) exposure to covid-19: Secondary | ICD-10-CM | POA: Diagnosis present

## 2020-12-03 DIAGNOSIS — Z79899 Other long term (current) drug therapy: Secondary | ICD-10-CM

## 2020-12-03 DIAGNOSIS — E875 Hyperkalemia: Secondary | ICD-10-CM | POA: Diagnosis not present

## 2020-12-03 DIAGNOSIS — R053 Chronic cough: Secondary | ICD-10-CM | POA: Diagnosis not present

## 2020-12-03 DIAGNOSIS — D696 Thrombocytopenia, unspecified: Secondary | ICD-10-CM | POA: Diagnosis present

## 2020-12-03 DIAGNOSIS — C9110 Chronic lymphocytic leukemia of B-cell type not having achieved remission: Secondary | ICD-10-CM

## 2020-12-03 DIAGNOSIS — Z96652 Presence of left artificial knee joint: Secondary | ICD-10-CM | POA: Diagnosis not present

## 2020-12-03 DIAGNOSIS — E538 Deficiency of other specified B group vitamins: Secondary | ICD-10-CM | POA: Diagnosis not present

## 2020-12-03 DIAGNOSIS — J9601 Acute respiratory failure with hypoxia: Secondary | ICD-10-CM | POA: Diagnosis present

## 2020-12-03 DIAGNOSIS — E785 Hyperlipidemia, unspecified: Secondary | ICD-10-CM | POA: Diagnosis present

## 2020-12-03 DIAGNOSIS — N1832 Chronic kidney disease, stage 3b: Secondary | ICD-10-CM | POA: Diagnosis not present

## 2020-12-03 DIAGNOSIS — Z881 Allergy status to other antibiotic agents status: Secondary | ICD-10-CM

## 2020-12-03 DIAGNOSIS — T451X5A Adverse effect of antineoplastic and immunosuppressive drugs, initial encounter: Secondary | ICD-10-CM | POA: Diagnosis present

## 2020-12-03 DIAGNOSIS — D6959 Other secondary thrombocytopenia: Secondary | ICD-10-CM | POA: Diagnosis present

## 2020-12-03 DIAGNOSIS — D61810 Antineoplastic chemotherapy induced pancytopenia: Secondary | ICD-10-CM | POA: Diagnosis not present

## 2020-12-03 DIAGNOSIS — R0600 Dyspnea, unspecified: Secondary | ICD-10-CM | POA: Diagnosis not present

## 2020-12-03 DIAGNOSIS — J90 Pleural effusion, not elsewhere classified: Secondary | ICD-10-CM | POA: Diagnosis not present

## 2020-12-03 LAB — CBC WITH DIFFERENTIAL (CANCER CENTER ONLY)
Abs Immature Granulocytes: 0.7 10*3/uL — ABNORMAL HIGH (ref 0.00–0.07)
Basophils Absolute: 0.1 10*3/uL (ref 0.0–0.1)
Basophils Relative: 0 %
Eosinophils Absolute: 0.6 10*3/uL — ABNORMAL HIGH (ref 0.0–0.5)
Eosinophils Relative: 0 %
HCT: 35.5 % — ABNORMAL LOW (ref 36.0–46.0)
Hemoglobin: 10.3 g/dL — ABNORMAL LOW (ref 12.0–15.0)
Immature Granulocytes: 1 %
Lymphocytes Relative: 83 %
Lymphs Abs: 129.7 10*3/uL — ABNORMAL HIGH (ref 0.7–4.0)
MCH: 27.6 pg (ref 26.0–34.0)
MCHC: 29 g/dL — ABNORMAL LOW (ref 30.0–36.0)
MCV: 95.2 fL (ref 80.0–100.0)
Monocytes Absolute: 16.5 10*3/uL — ABNORMAL HIGH (ref 0.1–1.0)
Monocytes Relative: 11 %
Neutro Abs: 7.8 10*3/uL — ABNORMAL HIGH (ref 1.7–7.7)
Neutrophils Relative %: 5 %
Platelet Count: 133 10*3/uL — ABNORMAL LOW (ref 150–400)
RBC: 3.73 MIL/uL — ABNORMAL LOW (ref 3.87–5.11)
RDW: 17.9 % — ABNORMAL HIGH (ref 11.5–15.5)
WBC Count: 155.4 10*3/uL (ref 4.0–10.5)
nRBC: 0 % (ref 0.0–0.2)

## 2020-12-03 LAB — CBC
HCT: 33.4 % — ABNORMAL LOW (ref 36.0–46.0)
Hemoglobin: 9.7 g/dL — ABNORMAL LOW (ref 12.0–15.0)
MCH: 28.2 pg (ref 26.0–34.0)
MCHC: 29 g/dL — ABNORMAL LOW (ref 30.0–36.0)
MCV: 97.1 fL (ref 80.0–100.0)
Platelets: 118 10*3/uL — ABNORMAL LOW (ref 150–400)
RBC: 3.44 MIL/uL — ABNORMAL LOW (ref 3.87–5.11)
RDW: 18.1 % — ABNORMAL HIGH (ref 11.5–15.5)
WBC: 108.5 10*3/uL (ref 4.0–10.5)
nRBC: 0 % (ref 0.0–0.2)

## 2020-12-03 LAB — URINALYSIS, ROUTINE W REFLEX MICROSCOPIC
Bilirubin Urine: NEGATIVE
Glucose, UA: NEGATIVE mg/dL
Hgb urine dipstick: NEGATIVE
Ketones, ur: NEGATIVE mg/dL
Nitrite: NEGATIVE
Protein, ur: NEGATIVE mg/dL
Specific Gravity, Urine: 1.017 (ref 1.005–1.030)
pH: 5 (ref 5.0–8.0)

## 2020-12-03 LAB — CMP (CANCER CENTER ONLY)
ALT: 14 U/L (ref 0–44)
AST: 12 U/L — ABNORMAL LOW (ref 15–41)
Albumin: 3.6 g/dL (ref 3.5–5.0)
Alkaline Phosphatase: 117 U/L (ref 38–126)
Anion gap: 8 (ref 5–15)
BUN: 22 mg/dL (ref 8–23)
CO2: 28 mmol/L (ref 22–32)
Calcium: 9.1 mg/dL (ref 8.9–10.3)
Chloride: 106 mmol/L (ref 98–111)
Creatinine: 1.55 mg/dL — ABNORMAL HIGH (ref 0.44–1.00)
GFR, Estimated: 35 mL/min — ABNORMAL LOW (ref >60)
Glucose, Bld: 98 mg/dL (ref 70–99)
Potassium: 4.3 mmol/L (ref 3.5–5.1)
Sodium: 142 mmol/L (ref 135–145)
Total Bilirubin: 0.5 mg/dL (ref 0.3–1.2)
Total Protein: 5.9 g/dL — ABNORMAL LOW (ref 6.5–8.1)

## 2020-12-03 LAB — PROTIME-INR
INR: 1.2 (ref 0.8–1.2)
Prothrombin Time: 14.9 seconds (ref 11.4–15.2)

## 2020-12-03 LAB — LACTIC ACID, PLASMA
Lactic Acid, Venous: 0.6 mmol/L (ref 0.5–1.9)
Lactic Acid, Venous: 0.6 mmol/L (ref 0.5–1.9)

## 2020-12-03 LAB — RESP PANEL BY RT-PCR (FLU A&B, COVID) ARPGX2
Influenza A by PCR: NEGATIVE
Influenza B by PCR: NEGATIVE
SARS Coronavirus 2 by RT PCR: NEGATIVE

## 2020-12-03 LAB — CREATININE, SERUM
Creatinine, Ser: 1.78 mg/dL — ABNORMAL HIGH (ref 0.44–1.00)
GFR, Estimated: 30 mL/min — ABNORMAL LOW (ref >60)

## 2020-12-03 LAB — APTT: aPTT: 36 seconds (ref 24–36)

## 2020-12-03 MED ORDER — AZELASTINE HCL 0.15 % NA SOLN: 2.0000 | Freq: Every day | NASAL | Status: DC

## 2020-12-03 MED ORDER — LEVOCETIRIZINE DIHYDROCHLORIDE 5 MG PO TABS: 5.0000 mg | ORAL_TABLET | Freq: Every evening | ORAL | Status: DC

## 2020-12-03 MED ORDER — VITAMIN D 25 MCG (1000 UNIT) PO TABS
2000.0000 [IU] | ORAL_TABLET | Freq: Every day | ORAL | Status: DC
  Administered 2020-12-04 – 2020-12-09 (×6): 2000 [IU] via ORAL
  Filled 2020-12-03 (×6): qty 2

## 2020-12-03 MED ORDER — AZELASTINE HCL 0.1 % NA SOLN
2.0000 | Freq: Every day | NASAL | Status: DC
  Administered 2020-12-04 – 2020-12-09 (×6): 2 via NASAL
  Filled 2020-12-03: qty 30

## 2020-12-03 MED ORDER — CITALOPRAM HYDROBROMIDE 20 MG PO TABS
20.0000 mg | ORAL_TABLET | Freq: Every morning | ORAL | Status: DC
  Administered 2020-12-04 – 2020-12-09 (×6): 20 mg via ORAL
  Filled 2020-12-03 (×5): qty 1

## 2020-12-03 MED ORDER — ACETAMINOPHEN 325 MG PO TABS
650.0000 mg | ORAL_TABLET | Freq: Four times a day (QID) | ORAL | Status: DC | PRN
  Administered 2020-12-06 – 2020-12-08 (×2): 650 mg via ORAL
  Filled 2020-12-03 (×2): qty 2

## 2020-12-03 MED ORDER — PREDNISONE 20 MG PO TABS
40.0000 mg | ORAL_TABLET | Freq: Every day | ORAL | Status: DC
  Administered 2020-12-03 – 2020-12-08 (×6): 40 mg via ORAL
  Filled 2020-12-03 (×6): qty 2

## 2020-12-03 MED ORDER — BISOPROLOL FUMARATE 5 MG PO TABS
10.0000 mg | ORAL_TABLET | Freq: Every day | ORAL | Status: DC
  Administered 2020-12-03 – 2020-12-09 (×7): 10 mg via ORAL
  Filled 2020-12-03 (×5): qty 2
  Filled 2020-12-03: qty 1
  Filled 2020-12-03 (×3): qty 2

## 2020-12-03 MED ORDER — ALBUTEROL SULFATE (2.5 MG/3ML) 0.083% IN NEBU: 2.5000 mg | INHALATION_SOLUTION | Freq: Two times a day (BID) | RESPIRATORY_TRACT | Status: DC

## 2020-12-03 MED ORDER — ALBUTEROL SULFATE (2.5 MG/3ML) 0.083% IN NEBU
2.5000 mg | INHALATION_SOLUTION | RESPIRATORY_TRACT | Status: DC | PRN
Start: 2020-12-03 — End: 2020-12-03
  Administered 2020-12-03: 2.5 mg via RESPIRATORY_TRACT
  Filled 2020-12-03: qty 3

## 2020-12-03 MED ORDER — DOXYCYCLINE HYCLATE 100 MG PO TABS
100.0000 mg | ORAL_TABLET | Freq: Once | ORAL | Status: AC
  Administered 2020-12-03: 100 mg via ORAL
  Filled 2020-12-03: qty 1

## 2020-12-03 MED ORDER — ONDANSETRON HCL 4 MG PO TABS: 8.0000 mg | ORAL_TABLET | Freq: Three times a day (TID) | ORAL | Status: DC | PRN

## 2020-12-03 MED ORDER — VANCOMYCIN HCL 2000 MG/400ML IV SOLN
2000.0000 mg | Freq: Once | INTRAVENOUS | Status: AC
  Administered 2020-12-03: 2000 mg via INTRAVENOUS
  Filled 2020-12-03: qty 400

## 2020-12-03 MED ORDER — AMLODIPINE BESYLATE 5 MG PO TABS: 10.0000 mg | ORAL_TABLET | Freq: Every day | ORAL | Status: DC

## 2020-12-03 MED ORDER — PROSIGHT PO TABS
1.0000 | ORAL_TABLET | Freq: Two times a day (BID) | ORAL | Status: DC
  Administered 2020-12-03 – 2020-12-09 (×12): 1 via ORAL
  Filled 2020-12-03 (×12): qty 1

## 2020-12-03 MED ORDER — MOMETASONE FURO-FORMOTEROL FUM 200-5 MCG/ACT IN AERO
2.0000 | INHALATION_SPRAY | Freq: Two times a day (BID) | RESPIRATORY_TRACT | Status: DC
  Administered 2020-12-03 – 2020-12-09 (×12): 2 via RESPIRATORY_TRACT
  Filled 2020-12-03: qty 8.8

## 2020-12-03 MED ORDER — VITAMIN D (ERGOCALCIFEROL) 50 MCG (2000 UT) PO CAPS: 2000.0000 [IU] | ORAL_CAPSULE | Freq: Every day | ORAL | Status: DC

## 2020-12-03 MED ORDER — SODIUM CHLORIDE 0.9 % IV SOLN
1.0000 g | Freq: Once | INTRAVENOUS | Status: AC
  Administered 2020-12-03: 1 g via INTRAVENOUS
  Filled 2020-12-03: qty 10

## 2020-12-03 MED ORDER — GUAIFENESIN ER 600 MG PO TB12
600.0000 mg | ORAL_TABLET | Freq: Two times a day (BID) | ORAL | Status: DC
  Administered 2020-12-03: 600 mg via ORAL
  Filled 2020-12-03 (×2): qty 1

## 2020-12-03 MED ORDER — AMLODIPINE BESYLATE 10 MG PO TABS
10.0000 mg | ORAL_TABLET | Freq: Every day | ORAL | Status: DC
  Administered 2020-12-04 – 2020-12-09 (×6): 10 mg via ORAL
  Filled 2020-12-03 (×6): qty 1

## 2020-12-03 MED ORDER — LIDOCAINE-PRILOCAINE 2.5-2.5 % EX CREA: 1.0000 "application " | TOPICAL_CREAM | Freq: Every day | CUTANEOUS | Status: DC | PRN

## 2020-12-03 MED ORDER — LORATADINE 10 MG PO TABS
10.0000 mg | ORAL_TABLET | Freq: Every day | ORAL | Status: DC
  Administered 2020-12-03 – 2020-12-08 (×6): 10 mg via ORAL
  Filled 2020-12-03 (×5): qty 1

## 2020-12-03 MED ORDER — ALLOPURINOL 300 MG PO TABS
300.0000 mg | ORAL_TABLET | Freq: Every day | ORAL | Status: DC
  Administered 2020-12-04 – 2020-12-09 (×6): 300 mg via ORAL
  Filled 2020-12-03 (×6): qty 1

## 2020-12-03 MED ORDER — VANCOMYCIN HCL 1250 MG/250ML IV SOLN: 1250.0000 mg | INTRAVENOUS | Status: DC

## 2020-12-03 MED ORDER — PRESERVISION AREDS 2 PO CAPS: 1.0000 | ORAL_CAPSULE | Freq: Two times a day (BID) | ORAL | Status: DC

## 2020-12-03 MED ORDER — GABAPENTIN 300 MG PO CAPS
300.0000 mg | ORAL_CAPSULE | Freq: Every day | ORAL | Status: DC
  Administered 2020-12-03 – 2020-12-08 (×6): 300 mg via ORAL
  Filled 2020-12-03 (×6): qty 1

## 2020-12-03 MED ORDER — SODIUM CHLORIDE 0.9 % IV SOLN
2.0000 g | Freq: Two times a day (BID) | INTRAVENOUS | Status: DC
  Administered 2020-12-03 – 2020-12-09 (×12): 2 g via INTRAVENOUS
  Filled 2020-12-03 (×13): qty 2

## 2020-12-03 MED ORDER — ENOXAPARIN SODIUM 40 MG/0.4ML ~~LOC~~ SOLN
40.0000 mg | SUBCUTANEOUS | Status: DC
  Administered 2020-12-03 – 2020-12-08 (×6): 40 mg via SUBCUTANEOUS
  Filled 2020-12-03 (×6): qty 0.4

## 2020-12-03 MED ORDER — ACYCLOVIR 400 MG PO TABS
400.0000 mg | ORAL_TABLET | Freq: Every day | ORAL | Status: DC
  Administered 2020-12-04 – 2020-12-09 (×6): 400 mg via ORAL
  Filled 2020-12-03 (×6): qty 1

## 2020-12-03 MED ORDER — IOHEXOL 350 MG/ML SOLN
75.0000 mL | Freq: Once | INTRAVENOUS | Status: AC | PRN
  Administered 2020-12-03: 70 mL via INTRAVENOUS

## 2020-12-03 NOTE — Progress Notes (Signed)
Given critical lab WBC of 155.4 to Dr. Alvy Bimler. She will see in the office today.

## 2020-12-03 NOTE — ED Triage Notes (Signed)
Pt coming from [facility] with low sats. Sats on room air around 74% with exertion. Sitting still sats around 87%. Takes oral chemo. Hx Lymphocytic leukemia.

## 2020-12-03 NOTE — ED Notes (Signed)
Patient transported to CT 

## 2020-12-03 NOTE — Progress Notes (Signed)
Pharmacy Antibiotic Note  Erin Moore is a 75 y.o. 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 Chest CTA on 2/16 came back negative for PE, but showed findings consistent with multifocal PNA.  Pharmacy has been consulted to dose vancomycin and cefepime for PNA.  - scr 1.55 (crcl~36)  Plan: - vancomycin 2000 mg IV x1, then 1250 mg IV q48h for est AUC 484 - cefepime 2gm IV q12h - monitor renal function closely __________________________________  Temp (24hrs), Avg:98.9 F (37.2 C), Min:98.8 F (37.1 C), Max:99 F (37.2 C)  Recent Labs  Lab 12/03/20 0950 12/03/20 1101  WBC 155.4*  --   CREATININE 1.55*  --   LATICACIDVEN  --  0.6    Estimated Creatinine Clearance: 35.7 mL/min (A) (by C-G formula based on SCr of 1.55 mg/dL (H)).    Allergies  Allergen Reactions  . Azithromycin Hives    Hives and fever  . Ibuprofen Other (See Comments) and Swelling    Hives     Thank you for allowing pharmacy to be a part of this patient's care.  Lynelle Doctor 12/03/2020 5:33 PM

## 2020-12-03 NOTE — H&P (Signed)
History and Physical    Erin Moore:[id number] DOB: [birth year] DOA: 12/03/2020  PCP: Erin Jordan, MD  Patient coming from: [place]  Chief Complaint: Dyspnea  HPI: Erin Moore is a 75 y.o. female with medical history significant of CLL, HTN, anxiety. Presenting with dyspnea. She was at the Cancer center for follow up with her oncologist today. She has had difficulty with cough for several weeks. Her oncologist placed her on steroids and cefdinir after OTC medicine did not provide relief. Her symptoms improved, but as soon as the steroids and abx were stopped, her symptoms returned. When she was seen in clinic today, she was found to be dyspneic and hypoxic to the 80's, so she was sent to the ED. She denies any other aggravating or alleviating factors. Of note, she is currently on infusions for CLL.   ED Course: She was started on 4L [place] and CAP coverage. PNA showed multifocal PNA. TRH was called for admission.   Review of Systems:  Denies CP, palpitations, fever, N/V/D. Reports non-productive cough. Review of systems is otherwise negative for all not mentioned in HPI.   PMHx Past Medical History:  Diagnosis Date  . Chronic lymphocytic leukemia (CLL), B-cell ([place]) 12/18/2013  . CLL (chronic lymphocytic leukemia) ([place]) FALL OF 2011  . CLL (chronic lymphocytic leukemia) ([place]) 06/02/2015  . Depression   . Fatigue 07/18/2013  . Hyperlipemia   . Hypertension   . Learning disorder   . Mental disability   . Osteoarthritis   . Pharyngitis 09/18/2013  . Upper respiratory symptom 01/2018    PSHx Past Surgical History:  Procedure Laterality Date  . ABDOMINAL HYSTERECTOMY    . BREAST BIOPSY Left 2011  . BREAST REDUCTION SURGERY    . CARPAL TUNNEL RELEASE     BILATERAL  . CESAREAN SECTION    . IR FLUORO GUIDE PORT INSERTION RIGHT  01/17/2018  . IR US GUIDE VASC ACCESS RIGHT  01/17/2018  . REDUCTION MAMMAPLASTY Bilateral 1969  . REPLACEMENT TOTAL KNEE     LEFT KNEE  .  TONSILLECTOMY    . TUBAL LIGATION  1975   BILATERAL    SocHx  reports that she has never smoked. She has never used smokeless tobacco. She reports that she does not drink alcohol and does not use drugs.  Allergies  Allergen Reactions  . Azithromycin Hives    Hives and fever  . Ibuprofen Other (See Comments) and Swelling    Hives    FamHx Family History  Problem Relation Age of Onset  . Breast cancer Mother     Prior to Admission medications   Medication Sig Start Date End Date Taking? Authorizing Provider  acalabrutinib (CALQUENCE) 100 MG capsule Take 1 capsule (100 mg total) by mouth 2 (two) times daily. 08/12/20   Erin Lark, MD  acetaminophen (TYLENOL) 325 MG tablet Take 650 mg by mouth every 6 (six) hours as needed.    [provider]  acyclovir (ZOVIRAX) 400 MG tablet Take 1 tablet (400 mg total) by mouth daily. 02/05/20   Erin Lark, MD  albuterol (VENTOLIN HFA) 108 (90 Base) MCG/ACT inhaler Inhale 2 puffs into the lungs in the morning and at bedtime.    [provider]  allopurinol (ZYLOPRIM) 300 MG tablet Take 1 tablet (300 mg total) by mouth daily. 02/06/20   Erin Lark, MD  amLODipine (NORVASC) 10 MG tablet TAKE 1 TABLET (10 MG TOTAL) BY MOUTH DAILY. 12/09/14   Erin Lark, MD  azelastine (ASTELIN)  0.1 % nasal spray Place 1 spray into both nostrils 2 (two) times daily. Use in each nostril as directed    [provider]  bisoprolol (ZEBETA) 10 MG tablet Take 10 mg by mouth daily. 11/28/20   [provider]  bisoprolol (ZEBETA) 5 MG tablet Take 10 mg by mouth daily.  08/14/17   [provider]  Carboxymethylcellul-Glycerin (REFRESH OPTIVE SENSITIVE OP) Place 1 drop into both eyes as needed.     [provider]  citalopram (CELEXA) 20 MG tablet Take 20 mg by mouth every morning.  11/11/10   Erin Jordan, MD  fluticasone-salmeterol (ADVAIR HFA) [phone] MCG/ACT inhaler Inhale 2 puffs into the lungs 2 (two) times daily.     [provider]  gabapentin (NEURONTIN) 300 MG capsule Take 300 mg by mouth at bedtime.    [provider]  hydrochlorothiazide (HYDRODIURIL) 25 MG tablet Take 25 mg by mouth daily.    [provider]  levocetirizine (XYZAL) 5 MG tablet Take 5 mg by mouth every evening.    [provider]  lidocaine-prilocaine (EMLA) cream Apply to affected area once 09/25/18   Erin Lark, MD  losartan (COZAAR) 100 MG tablet Take 100 mg by mouth daily. 11/03/20   [provider]  losartan-hydrochlorothiazide (HYZAAR) 100-25 MG tablet Take by mouth daily.  08/16/17   [provider]  Multiple Vitamins-Minerals (PRESERVISION AREDS 2) CAPS Take 1 capsule by mouth 2 (two) times daily.    [provider]  ondansetron (ZOFRAN) 8 MG tablet Take 1 tablet (8 mg total) by mouth 2 (two) times daily. Start second day after chemotherapy. Then take as needed for nausea or vomiting. 02/06/20   Erin Lark, MD  prochlorperazine (COMPAZINE) 10 MG tablet Take 1 tablet (10 mg total) by mouth every 6 (six) hours as needed (Nausea or vomiting). 02/06/20   Erin Lark, MD  sulfamethoxazole-trimethoprim (BACTRIM DS) 800-160 MG tablet Take 1 tablet by mouth 3 (three) times a week. 02/06/20   Erin Lark, MD  triamcinolone (NASACORT) 55 MCG/ACT AERO nasal inhaler Place 2 sprays into the nose daily. 04/30/19   Erin Lark, MD  Vitamin D, Ergocalciferol, 2000 units CAPS Take by mouth.    [provider]    Physical Exam: Vitals:   12/03/20 1107 12/03/20 1130 12/03/20 1200 12/03/20 1306  BP:  (!) 113/50 (!) 115/47 121/68  Pulse: 74 73 73 70  Resp: (!) 25 (!) 24 10 18   Temp:      TempSrc:      SpO2: 93% 95% 93% 95%    General: 75 y.o. female resting in bed in NAD Eyes: PERRL, normal sclera ENMT: Nares patent w/o discharge, orophaynx clear, dentition normal, ears w/o discharge/lesions/ulcers Neck: Supple, trachea midline Cardiovascular: RRR, +S1, S2, no m/g/r, equal  pulses throughout Respiratory: course,w/ rhonchi/wheeze RLL, scattered rhonchi LLL, RUL, no w/r/r, slightly increased WOB on 4L [place] GI: BS+, NDNT, no masses noted, no organomegaly noted MSK: No e/c/c Skin: No rashes, bruises, ulcerations noted Neuro: A&O x 3, no focal deficits Psyc: Appropriate interaction and affect, calm/cooperative  Labs on Admission: I have personally reviewed following labs and imaging studies  CBC: Recent Labs  Lab 12/03/20 0950  WBC 155.4*  NEUTROABS 7.8*  HGB 10.3*  HCT 35.5*  MCV 95.2  PLT 629*   Basic Metabolic Panel: Recent Labs  Lab 12/03/20 0950  NA 142  K 4.3  CL 106  CO2 28  GLUCOSE 98  BUN 22  CREATININE 1.55*  CALCIUM  9.1   GFR: Estimated Creatinine Clearance: 35.7 mL/min (A) (by C-G formula based on SCr of 1.55 mg/dL (H)). Liver Function Tests: Recent Labs  Lab 12/03/20 0950  AST 12*  ALT 14  ALKPHOS 117  BILITOT 0.5  PROT 5.9*  ALBUMIN 3.6   No results for input(s): LIPASE, AMYLASE in the last 168 hours. No results for input(s): AMMONIA in the last 168 hours. Coagulation Profile: Recent Labs  Lab 12/03/20 1101  INR 1.2   Cardiac Enzymes: No results for input(s): CKTOTAL, CKMB, CKMBINDEX, TROPONINI in the last 168 hours. BNP (last 3 results) No results for input(s): PROBNP in the last 8760 hours. HbA1C: No results for input(s): HGBA1C in the last 72 hours. CBG: No results for input(s): GLUCAP in the last 168 hours. Lipid Profile: No results for input(s): CHOL, HDL, LDLCALC, TRIG, CHOLHDL, LDLDIRECT in the last 72 hours. Thyroid Function Tests: No results for input(s): TSH, T4TOTAL, FREET4, T3FREE, THYROIDAB in the last 72 hours. Anemia Panel: No results for input(s): VITAMINB12, FOLATE, FERRITIN, TIBC, IRON, RETICCTPCT in the last 72 hours. Urine analysis:    Component Value Date/Time   COLORURINE YELLOW 12/03/2020 [place] 12/03/2020 1234   LABSPEC 1.017 12/03/2020 1234   PHURINE 5.0  12/03/2020 1234   GLUCOSEU NEGATIVE 12/03/2020 1234   HGBUR NEGATIVE 12/03/2020 [place] 12/03/2020 [place] 12/03/2020 1234   PROTEINUR NEGATIVE 12/03/2020 1234   NITRITE NEGATIVE 12/03/2020 1234   LEUKOCYTESUR MODERATE (A) 12/03/2020 1234    Radiological Exams on Admission: CT Angio Chest PE W and/or Wo Contrast  Result Date: 12/03/2020 CLINICAL DATA:  Concern for pulmonary embolism. History of chronic lymphocytic leukemia. EXAM: CT ANGIOGRAPHY CHEST WITH CONTRAST TECHNIQUE: Multidetector CT imaging of the chest was performed using the standard protocol during bolus administration of intravenous contrast. Multiplanar CT image reconstructions and MIPs were obtained to evaluate the vascular anatomy. CONTRAST:  70mL OMNIPAQUE IOHEXOL 350 MG/ML SOLN COMPARISON:  CT 08/11/2020 FINDINGS: Cardiovascular: No filling defects within the pulmonary arteries to suggest acute pulmonary embolism. Mediastinum/Nodes: Bulky mediastinal perihilar lymphadenopathy again noted. For example lymph node along the RIGHT bronchus intermedius measuring 25 mm compares to 25 mm. RIGHT lower paratracheal node measuring 18 mm compares to 15 mm. Lungs/Pleura: There is patchy bilateral nodular airspace disease which is worsened from comparison exam. Consolidation and peripheral nodularity in the posterior RIGHT upper lobe on image 65/6 is worsened. There is a consolidation at the LEFT lung base measuring 3.4 cm (image 88/6) which is new. There is atelectasis in the RIGHT middle lobe slightly increased. There is a RIGHT pleural effusion which is new. W Ithin the lung apices there is patchy nodular airspace disease also worsened from comparison exam. Upper Abdomen: A periaortic retroperitoneal adenopathy similar to prior. Spleen appears enlarged. Musculoskeletal: No aggressive osseous lesion Review of the MIP images confirms the above findings. IMPRESSION: 1. No acute pulmonary embolism. 2. New  bilateral patchy, nodular and consolidative airspace disease most consistent multifocal pneumonia including atypical pneumonia. Secondary differential would include a pulmonary lymphoma, less favored. 3. Bulky mediastinal and axillary lymphadenopathy related to CLL. No change from prior. 4. Splenomegaly and upper abdominal adenopathy related to CLL. Electronically Signed   By: Suzy Bouchard M.D.   On: 12/03/2020 13:41    EKG: Independently reviewed. NSR, no st elevations  Assessment/Plan Multifocal PNA Acute hypoxic respiratory failure     - admit to inpt, med-surg     - she's completed CAP  coverage outpt, will broaden to broad spec (vanc, cefepime), check sputum culture, check MRSA swab     - COVID negative     - prednisone 40 mg qday; albuterol     - wean O2 as able, add IS, flutter  CLL     - per onco, appreciate assistance  CKD3b     - at baseline, watch nephrotoxins, fluid status  Normocytic anemia Thrombocytopenia     - no evidence of bleed, follow  DVT prophylaxis: lovenox  Code Status: FULL  Family Communication: With sister at bedside.  Consults called: Onco (Dr. Alvy Bimler) aware.   Status is: Inpatient  Remains inpatient appropriate because:Inpatient level of care appropriate due to severity of illness   Dispo: The patient is from: ALF              Anticipated d/c is to: ALF              Anticipated d/c date is: 2 days              Patient currently is not medically stable to d/c.   Difficult to place patient No  Jonnie Finner DO [facility]  If 7PM-7AM, please contact night-coverage [email or web address]  12/03/2020, 2:45 PM

## 2020-12-03 NOTE — Assessment & Plan Note (Signed)
Her last chest x-ray showed bilateral interstitial infiltrate Her Covid test was negative As soon as the patient completed a course of Omnicef and prednisone, she get recurrent cough Today, her oxygen saturation on room air is at 88% and a drop to less than 80% on short distance of walking Given her acute respiratory failure and persistent cough, I recommend her to go to the emergency department for urgent evaluation, CT imaging and broad-spectrum IV antibiotics for possible unresolved pneumonia

## 2020-12-03 NOTE — Progress Notes (Signed)
[facility] OFFICE PROGRESS NOTE  Patient Care Team: Jonathon Jordan, MD as PCP - General (Family Medicine) Heath Lark, MD as Referring Physician (Hematology and Oncology)  ASSESSMENT & PLAN:  CLL (chronic lymphocytic leukemia) ([place]) Her extreme leukocytosis is due to her CLL and treatment Clinically, the patient appears to have probable pneumonia We will put her chemotherapy pill on hold for now She needs to be admitted for broad-spectrum IV antibiotics and CT imaging  Bilateral interstitial pneumonia (HCC) Her last chest x-ray showed bilateral interstitial infiltrate Her Covid test was negative As soon as the patient completed a course of Omnicef and prednisone, she get recurrent cough Today, her oxygen saturation on room air is at 88% and a drop to less than 80% on short distance of walking Given her acute respiratory failure and persistent cough, I recommend her to go to the emergency department for urgent evaluation, CT imaging and broad-spectrum IV antibiotics for possible unresolved pneumonia  Pancytopenia, acquired ([place]) She has persistent pancytopenia due to treatment She also have B12 deficiency and has received aggressive vitamin B12 supplement Observe closely for now  Goals of care, counseling/discussion I reviewed the goals of care with the patient and her sister who is the healthcare power of attorney Due to the patient's mild baseline mental deficit, her sister makes all her medical decision The patient needs urgent evaluation with imaging study, broad-spectrum IV antibiotics and admission to the hospital It is not safe to be discharged in the setting of acute respiratory failure She agreed with the plan of care   No orders of the defined types were placed in this encounter.   All questions were answered. The patient knows to call the clinic with any problems, questions or concerns. The total time spent in the appointment was 40 minutes encounter  with patients including review of chart and various tests results, discussions about plan of care and coordination of care plan   Heath Lark, MD 12/03/2020 10:26 AM  INTERVAL HISTORY: Please see below for problem oriented charting. She returns with her sister for further follow-up Since last time I saw her, I have prescribed a course of prednisone and antibiotics As soon as she finished her treatment, her cough get worse Today, her oxygen saturation is low The patient struggle to breathe She denies recent fever or chills  SUMMARY OF ONCOLOGIC HISTORY: Oncology History Overview Note  FISH del 13 q   CLL (chronic lymphocytic leukemia) ([place])  07/02/2010 Procedure   LN biopsy confirmed CLL   08/18/2010 Procedure   Bone marrow biopsy confirmed CLL   11/18/2010 - 04/15/2011 Chemotherapy   patient received 6 cycles of FLudarabine and Rituximab   01/13/2012 Relapse/Recurrence   Repeat BM biopsy confirmed relapse   01/27/2012 - 06/23/2012 Chemotherapy   Bendamustine & Rituximab given, complicated by infusion reaction to Rituximab, completed 6 cycles   06/29/2013 Imaging   Ct scan confirmed disease relapse with bulky lymphadenopathy   07/23/2013 Procedure   Repeat BM biopsy due to thrombocytopenia and progressive leukocytosis   08/02/2013 Relapse/Recurrence   Patient consented to start iburitinib as salvage therapy for relapsed CLL   11/09/2013 Imaging   Repeat CT scan the chest, abdomen and pelvis showed greater than 50% response to treatment   06/17/2014 Imaging   Repeat CT scan showed near complete response to treatment.   05/30/2015 Imaging   Repeat CT scan showed near complete response to treatment.   06/14/2016 Imaging   CT scan showed stable scattered small retroperitoneal  lymph nodes and pelvic lymph nodes. No findings for recurrent or progressive lymphoma. Stable mild splenomegaly.   12/30/2017 Imaging   1. Recurrent adenopathy within the chest, abdomen, and pelvis. 2.  Splenomegaly. 3. Aortic Atherosclerosis (ICD10-I70.0). 4. Coronary artery calcifications.   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 The catheter is ready for immediate use.   01/24/2018 -  Chemotherapy   The patient had Gazyva and 1 dose of bendamustine. Bendamustine was discontinued due to inability to get insurance approval   05/08/2018 Imaging   1. Interval partial treatment response. Bilateral axillary, mediastinal, right hilar, retroperitoneal and bilateral pelvic adenopathy is all decreased. Mild splenomegaly, decreased. No new or progressive disease. 2. Aortic Atherosclerosis (ICD10-I70.0).   01/31/2020 Imaging   1. Since 12/29/2017, marked progression of adenopathy within the chest, abdomen, and pelvis as detailed above. 2. Progressive splenomegaly, consistent with splenic involvement. 3. New pulmonary parenchymal findings which could represent interval infection (including atypical etiologies) or aspiration. 4. Coronary artery atherosclerosis. Aortic Atherosclerosis (ICD10-I70.0).   02/07/2020 - 07/22/2020 Chemotherapy   The patient bendamustine for chemotherapy treatment.     04/28/2020 Imaging   Chest Impression:   1. New consolidation in the LEFT upper lobe is concerning for pneumonia versus lymphoma. Recommend clinical correlation with pulmonary infection. Favor recurrent pulmonary infection in light of RIGHT lung infection demonstrated on CT January 31, 2020. 2. Resolution of ground-glass densities in the RIGHT upper lobe and consolidation in the RIGHT lower lobe consistent resolved pulmonary infection. 3. Persistent enlarged axillary, mediastinal, and hilar lymph nodes.  Overall adenopathy is mildly improved.   Abdomen / Pelvis Impression:   1. Improved bulky retroperitoneal and iliac lymphadenopathy. The large lymph  nodes are measurably decreased in size although remain bulky. 2. Marked splenomegaly also improved.   08/11/2020 Imaging   1. Disease progression, as evidenced by increase in adenopathy within the chest, abdomen, and pelvis. Persistent hepatosplenomegaly, with probable splenic involvement. 2. Although the left upper lobe consolidation has resolved, there are new areas of pulmonary opacity which could represent pulmonary lymphoma or concurrent pneumonia. 3. Coronary artery atherosclerosis. Aortic Atherosclerosis   08/18/2020 -  Chemotherapy   The patient had acalabrutinib for chemotherapy treatment.       REVIEW OF SYSTEMS:   Constitutional: Denies fevers, chills or abnormal weight loss Eyes: Denies blurriness of vision Ears, nose, mouth, throat, and face: Denies mucositis or sore throat Cardiovascular: Denies palpitation, chest discomfort or lower extremity swelling Gastrointestinal:  Denies nausea, heartburn or change in bowel habits Skin: Denies abnormal skin rashes Lymphatics: Denies new lymphadenopathy or easy bruising Neurological:Denies numbness, tingling or new weaknesses Behavioral/Psych: Mood is stable, no new changes  All other systems were reviewed with the patient and are negative.  I have reviewed the past medical history, past surgical history, social history and family history with the patient and they are unchanged from previous note.  ALLERGIES:  is allergic to azithromycin and ibuprofen.  MEDICATIONS:  Current Outpatient Medications  Medication Sig Dispense Refill  . acalabrutinib (CALQUENCE) 100 MG capsule Take 1 capsule (100 mg total) by mouth 2 (two) times daily. 60 capsule 11  . acetaminophen (TYLENOL) 325 MG tablet Take 650 mg by mouth every 6 (six) hours as needed.    Marland Kitchen acyclovir (ZOVIRAX) 400 MG tablet Take 1 tablet (400 mg total) by mouth daily. 30 tablet 5  .  albuterol (VENTOLIN HFA) 108 (90 Base) MCG/ACT inhaler Inhale 2 puffs into the lungs in the  morning and at bedtime.    Marland Kitchen allopurinol (ZYLOPRIM) 300 MG tablet Take 1 tablet (300 mg total) by mouth daily. 30 tablet 0  . amLODipine (NORVASC) 10 MG tablet TAKE 1 TABLET (10 MG TOTAL) BY MOUTH DAILY. 90 tablet 1  . azelastine (ASTELIN) 0.1 % nasal spray Place 1 spray into both nostrils 2 (two) times daily. Use in each nostril as directed    . bisoprolol (ZEBETA) 5 MG tablet Take 10 mg by mouth daily.   10  . Carboxymethylcellul-Glycerin (REFRESH OPTIVE SENSITIVE OP) Place 1 drop into both eyes as needed.     . citalopram (CELEXA) 20 MG tablet Take 20 mg by mouth every morning.     . fluticasone-salmeterol (ADVAIR HFA) 115-21 MCG/ACT inhaler Inhale 2 puffs into the lungs 2 (two) times daily.    Marland Kitchen gabapentin (NEURONTIN) 300 MG capsule Take 300 mg by mouth at bedtime.    . hydrochlorothiazide (HYDRODIURIL) 25 MG tablet Take 25 mg by mouth daily.    Marland Kitchen levocetirizine (XYZAL) 5 MG tablet Take 5 mg by mouth every evening.    . lidocaine-prilocaine (EMLA) cream Apply to affected area once 30 g 5  . losartan-hydrochlorothiazide (HYZAAR) 100-25 MG tablet Take by mouth daily.   9  . Multiple Vitamins-Minerals (PRESERVISION AREDS 2) CAPS Take 1 capsule by mouth 2 (two) times daily.    . ondansetron (ZOFRAN) 8 MG tablet Take 1 tablet (8 mg total) by mouth 2 (two) times daily. Start second day after chemotherapy. Then take as needed for nausea or vomiting. 30 tablet 1  . prochlorperazine (COMPAZINE) 10 MG tablet Take 1 tablet (10 mg total) by mouth every 6 (six) hours as needed (Nausea or vomiting). 30 tablet 1  . sulfamethoxazole-trimethoprim (BACTRIM DS) 800-160 MG tablet Take 1 tablet by mouth 3 (three) times a week. 12 tablet 5  . triamcinolone (NASACORT) 55 MCG/ACT AERO nasal inhaler Place 2 sprays into the nose daily. 1 Inhaler 12  . Vitamin D, Ergocalciferol, 2000 units CAPS Take by mouth.     No current facility-administered medications for this visit.    PHYSICAL EXAMINATION: ECOG PERFORMANCE  STATUS: 2 - Symptomatic, <50% confined to bed  Vitals:   12/03/20 1004  BP: (!) 137/50  Pulse: 75  Resp: (!) 24  Temp: 98.8 F (37.1 C)  SpO2: (!) 88%   Filed Weights   12/03/20 1004  Weight: 226 lb (102.5 kg)    GENERAL:alert, in mild respiratory distress SKIN: skin color, texture, turgor are normal, no rashes or significant lesions EYES: normal, Conjunctiva are pink and non-injected, sclera clear OROPHARYNX:no exudate, no erythema and lips, buccal mucosa, and tongue normal  NECK: supple, thyroid normal size, non-tender, without nodularity LYMPH:  no palpable lymphadenopathy in the cervical, axillary or inguinal LUNGS: She has increased breathing effort with bilateral scattered wheezes HEART: regular rate & rhythm and no murmurs and no lower extremity edema ABDOMEN:abdomen soft, non-tender and normal bowel sounds Musculoskeletal:no cyanosis of digits and no clubbing  NEURO: alert & oriented x 3 with fluent speech, no focal motor/sensory deficits  LABORATORY DATA:  I have reviewed the data as listed    Component Value Date/Time   NA 141 11/11/2020 1320   NA 141 08/23/2017 1026   K 4.4 11/11/2020 1320   K 3.7 08/23/2017 1026   CL 106 11/11/2020 1320   CL 98 02/02/2013 1353   CO2 26  11/11/2020 1320   CO2 33 (H) 08/23/2017 1026   GLUCOSE 103 (H) 11/11/2020 1320   GLUCOSE 92 08/23/2017 1026   GLUCOSE 99 02/02/2013 1353   BUN 26 (H) 11/11/2020 1320   BUN 21.0 08/23/2017 1026   CREATININE 1.47 (H) 11/11/2020 1320   CREATININE 1.2 (H) 08/23/2017 1026   CALCIUM 9.2 11/11/2020 1320   CALCIUM 9.3 08/23/2017 1026   PROT 6.0 (L) 11/11/2020 1320   PROT 5.9 (L) 03/28/2020 1044   PROT 6.3 (L) 08/23/2017 1026   ALBUMIN 3.9 11/11/2020 1320   ALBUMIN 3.8 08/23/2017 1026   AST 13 (L) 11/11/2020 1320   AST 11 08/23/2017 1026   ALT 8 11/11/2020 1320   ALT 10 08/23/2017 1026   ALKPHOS 95 11/11/2020 1320   ALKPHOS 66 08/23/2017 1026   BILITOT 0.5 11/11/2020 1320   BILITOT 0.59  08/23/2017 1026   GFRNONAA 37 (L) 11/11/2020 1320   GFRAA 49 (L) 07/21/2020 1120    No results found for: SPEP, UPEP  Lab Results  Component Value Date   WBC 155.4 (HH) 12/03/2020   NEUTROABS PENDING 12/03/2020   HGB 10.3 (L) 12/03/2020   HCT 35.5 (L) 12/03/2020   MCV 95.2 12/03/2020   PLT 133 (L) 12/03/2020      Chemistry      Component Value Date/Time   NA 141 11/11/2020 1320   NA 141 08/23/2017 1026   K 4.4 11/11/2020 1320   K 3.7 08/23/2017 1026   CL 106 11/11/2020 1320   CL 98 02/02/2013 1353   CO2 26 11/11/2020 1320   CO2 33 (H) 08/23/2017 1026   BUN 26 (H) 11/11/2020 1320   BUN 21.0 08/23/2017 1026   CREATININE 1.47 (H) 11/11/2020 1320   CREATININE 1.2 (H) 08/23/2017 1026      Component Value Date/Time   CALCIUM 9.2 11/11/2020 1320   CALCIUM 9.3 08/23/2017 1026   ALKPHOS 95 11/11/2020 1320   ALKPHOS 66 08/23/2017 1026   AST 13 (L) 11/11/2020 1320   AST 11 08/23/2017 1026   ALT 8 11/11/2020 1320   ALT 10 08/23/2017 1026   BILITOT 0.5 11/11/2020 1320   BILITOT 0.59 08/23/2017 1026       RADIOGRAPHIC STUDIES: I have personally reviewed the radiological images as listed and agreed with the findings in the report. DG Chest 2 View  Result Date: 11/11/2020 CLINICAL DATA:  75 year old female with cough. Chronic lymphocytic leukemia. EXAM: CHEST - 2 VIEW COMPARISON:  Chest radiograph dated 03/24/2018 FINDINGS: Port-A-Cath in similar position. Bilateral streaky and nodular densities most consistent with developing infiltrate, likely viral or atypical in etiology including COVID-19. Clinical correlation is recommended. The cardiac silhouette is within limits. Atherosclerotic calcification of the aorta. Right hilar mass/adenopathy measuring approximately 5 cm in craniocaudal length. No acute osseous pathology. Degenerative changes of the spine. IMPRESSION: 1. Findings most consistent with developing infiltrate. 2. Right hilar mass/adenopathy in keeping with history of  CLL. CT may provide better evaluation if clinically indicated. Electronically Signed   By: Anner Crete M.D.   On: 11/11/2020 20:52   MM DIAG BREAST TOMO UNI LEFT  Result Date: 11/10/2020 CLINICAL DATA:  Callback from screening mammogram for possible asymmetry left breast EXAM: DIGITAL DIAGNOSTIC UNILATERAL LEFT MAMMOGRAM WITH TOMO AND CAD TECHNIQUE: Left digital diagnostic mammography and breast tomosynthesis was performed. Digital images of the breasts were evaluated with computer-aided detection. COMPARISON:  Prior films ACR Breast Density Category b: There are scattered areas of fibroglandular density. FINDINGS: Cc and MLO views  of the left breast are submitted. Previously questioned asymmetry in the medial left breast does not persist on additional views. No suspicious abnormality is identified. IMPRESSION: Benign findings. RECOMMENDATION: Routine screening mammogram back on schedule. I have discussed the findings and recommendations with the patient. If applicable, a reminder letter will be sent to the patient regarding the next appointment. BI-RADS CATEGORY  2: Benign. Electronically Signed   By: Abelardo Diesel M.D.   On: 11/10/2020 10:27

## 2020-12-03 NOTE — Progress Notes (Signed)
CRITICAL VALUE ALERT  Critical Value:  WBC 108.5  Date & Time Notied: 12/04/2019 @2120   Provider Notified: NP Ouma  Orders Received/Actions taken: No new orders at the moment

## 2020-12-03 NOTE — ED Notes (Addendum)
Bedside commode at bedside. Per pts sister request. Pt offered purewick, pt sister noted "she cant use things like that."

## 2020-12-03 NOTE — Assessment & Plan Note (Signed)
She has persistent pancytopenia due to treatment She also have B12 deficiency and has received aggressive vitamin B12 supplement Observe closely for now

## 2020-12-03 NOTE — Assessment & Plan Note (Signed)
I reviewed the goals of care with the patient and her sister who is the healthcare power of attorney Due to the patient's mild baseline mental deficit, her sister makes all her medical decision The patient needs urgent evaluation with imaging study, broad-spectrum IV antibiotics and admission to the hospital It is not safe to be discharged in the setting of acute respiratory failure She agreed with the plan of care

## 2020-12-03 NOTE — ED Provider Notes (Signed)
[facility] DEPT Provider Note   CSN: [id number] Arrival date & time: 12/03/20  1028     History No chief complaint on file.   Erin Moore is a 75 y.o. female.  Patient with history of chronic lymphocytic leukemia, currently undergoing chemotherapy, presents the emergency department for cough, shortness of breath, hypoxia.  Patient has had a chronic cough.  She has been treated with steroids and antibiotics, cefdinir and prednisone, starting 1/25.  Patient had some short-lived improvement (Note from 11/20/20) but then the cough returned.  Chest x-ray then showed bilateral interstitial pneumonia in January.  Patient was seen today for reevaluation and was noted to be hypoxic into the 80s.  She was transported to the hospital for further evaluation.  Patient denies current fevers, vomiting.  She had some diarrhea with the antibiotics prescribed previously, however this is now resolved.  No reported urinary symptoms.  Reports negative Covid testing.  Denies worsening lower extremity swelling or orthopnea.  The onset of this condition was acute. The course is constant. Aggravating factors: exertion. Alleviating factors: none.          Past Medical History:  Diagnosis Date  . Chronic lymphocytic leukemia (CLL), B-cell ([place]) 12/18/2013  . CLL (chronic lymphocytic leukemia) ([place]) FALL OF 2011  . CLL (chronic lymphocytic leukemia) ([place]) 06/02/2015  . Depression   . Fatigue 07/18/2013  . Hyperlipemia   . Hypertension   . Learning disorder   . Mental disability   . Osteoarthritis   . Pharyngitis 09/18/2013  . Upper respiratory symptom 01/2018    Patient Active Problem List   Diagnosis Date Noted  . Bilateral interstitial pneumonia ([place]) 08/12/2020  . Left upper lobe pneumonia 04/29/2020  . CKD (chronic kidney disease), symptom management only, stage 3 (moderate) ([place]) 03/04/2020  . Peripheral neuropathy 02/14/2020  . Vitamin B12 deficiency 03/01/2019  .  Splenomegaly 05/10/2018  . Cough 03/24/2018  . Fall at home, initial encounter 02/21/2018  . Hyperuricemia 01/02/2018  . Goals of care, counseling/discussion 01/02/2018  . Pancytopenia, acquired ([place]) 12/22/2017  . Mental disability   . Thrombocytopenia, unspecified ([place]) 07/18/2013  . Other fatigue 07/18/2013  . Essential hypertension 01/20/2012  . Obesity, Class III, BMI 40-49.9 (morbid obesity) ([place]) 12/13/2011  . CLL (chronic lymphocytic leukemia) ([place]) 12/07/2011    Past Surgical History:  Procedure Laterality Date  . ABDOMINAL HYSTERECTOMY    . BREAST BIOPSY Left 2011  . BREAST REDUCTION SURGERY    . CARPAL TUNNEL RELEASE     BILATERAL  . CESAREAN SECTION    . IR FLUORO GUIDE PORT INSERTION RIGHT  01/17/2018  . IR US GUIDE VASC ACCESS RIGHT  01/17/2018  . REDUCTION MAMMAPLASTY Bilateral 1969  . REPLACEMENT TOTAL KNEE     LEFT KNEE  . TONSILLECTOMY    . TUBAL LIGATION  1975   BILATERAL     OB History   No obstetric history on file.     Family History  Problem Relation Age of Onset  . Breast cancer Mother     Social History   Tobacco Use  . Smoking status: Never Smoker  . Smokeless tobacco: Never Used  Substance Use Topics  . Alcohol use: No  . Drug use: No    Home Medications Prior to Admission medications   Medication Sig Start Date End Date Taking? Authorizing Provider  acalabrutinib (CALQUENCE) 100 MG capsule Take 1 capsule (100 mg total) by mouth 2 (two) times daily. 08/12/20   Heath Lark, MD  acetaminophen (TYLENOL) 325 MG tablet Take 650 mg by mouth every 6 (six) hours as needed.    [provider]  acyclovir (ZOVIRAX) 400 MG tablet Take 1 tablet (400 mg total) by mouth daily. 02/05/20   Heath Lark, MD  albuterol (VENTOLIN HFA) 108 (90 Base) MCG/ACT inhaler Inhale 2 puffs into the lungs in the morning and at bedtime.    [provider]  allopurinol (ZYLOPRIM) 300 MG tablet Take 1 tablet (300 mg total) by mouth daily. 02/06/20    Heath Lark, MD  amLODipine (NORVASC) 10 MG tablet TAKE 1 TABLET (10 MG TOTAL) BY MOUTH DAILY. 12/09/14   Heath Lark, MD  azelastine (ASTELIN) 0.1 % nasal spray Place 1 spray into both nostrils 2 (two) times daily. Use in each nostril as directed    [provider]  bisoprolol (ZEBETA) 10 MG tablet Take 10 mg by mouth daily. 11/28/20   [provider]  bisoprolol (ZEBETA) 5 MG tablet Take 10 mg by mouth daily.  08/14/17   [provider]  Carboxymethylcellul-Glycerin (REFRESH OPTIVE SENSITIVE OP) Place 1 drop into both eyes as needed.     [provider]  citalopram (CELEXA) 20 MG tablet Take 20 mg by mouth every morning.  11/11/10   Jonathon Jordan, MD  fluticasone-salmeterol (ADVAIR HFA) [phone] MCG/ACT inhaler Inhale 2 puffs into the lungs 2 (two) times daily.    [provider]  gabapentin (NEURONTIN) 300 MG capsule Take 300 mg by mouth at bedtime.    [provider]  hydrochlorothiazide (HYDRODIURIL) 25 MG tablet Take 25 mg by mouth daily.    [provider]  levocetirizine (XYZAL) 5 MG tablet Take 5 mg by mouth every evening.    [provider]  lidocaine-prilocaine (EMLA) cream Apply to affected area once 09/25/18   Heath Lark, MD  losartan (COZAAR) 100 MG tablet Take 100 mg by mouth daily. 11/03/20   [provider]  losartan-hydrochlorothiazide (HYZAAR) 100-25 MG tablet Take by mouth daily.  08/16/17   [provider]  Multiple Vitamins-Minerals (PRESERVISION AREDS 2) CAPS Take 1 capsule by mouth 2 (two) times daily.    [provider]  ondansetron (ZOFRAN) 8 MG tablet Take 1 tablet (8 mg total) by mouth 2 (two) times daily. Start second day after chemotherapy. Then take as needed for nausea or vomiting. 02/06/20   Heath Lark, MD  prochlorperazine (COMPAZINE) 10 MG tablet Take 1 tablet (10 mg total) by mouth every 6 (six) hours as needed (Nausea or vomiting). 02/06/20   Heath Lark, MD   sulfamethoxazole-trimethoprim (BACTRIM DS) 800-160 MG tablet Take 1 tablet by mouth 3 (three) times a week. 02/06/20   Heath Lark, MD  triamcinolone (NASACORT) 55 MCG/ACT AERO nasal inhaler Place 2 sprays into the nose daily. 04/30/19   Heath Lark, MD  Vitamin D, Ergocalciferol, 2000 units CAPS Take by mouth.    [provider]    Allergies    Azithromycin and Ibuprofen  Review of Systems   Review of Systems  Constitutional: Negative for fever.  HENT: Negative for rhinorrhea and sore throat.   Eyes: Negative for redness.  Respiratory: Positive for cough and shortness of breath.   Cardiovascular: Negative for chest pain and leg swelling.  Gastrointestinal: Negative for abdominal pain, diarrhea (Resolved), nausea and vomiting.  Genitourinary: Negative for dysuria, frequency, hematuria and urgency.  Musculoskeletal: Negative for myalgias.  Skin: Negative for rash.  Neurological: Negative for headaches.    Physical Exam Updated Vital Signs BP Marland Kitchen)  125/49   Pulse 78   Temp 99 F (37.2 C) (Oral)   Resp (!) 26   SpO2 91%   Physical Exam Vitals and nursing note reviewed.  Constitutional:      General: She is not in acute distress.    Appearance: She is well-developed.  HENT:     Head: Normocephalic and atraumatic.     Right Ear: External ear normal.     Left Ear: External ear normal.     Nose: Nose normal.  Eyes:     Conjunctiva/sclera: Conjunctivae normal.  Cardiovascular:     Rate and Rhythm: Normal rate and regular rhythm.     Heart sounds: No murmur heard.   Pulmonary:     Effort: No respiratory distress.     Breath sounds: Wheezing and rhonchi present. No rales.     Comments: Frequent wet cough during exam Abdominal:     Palpations: Abdomen is soft.     Tenderness: There is no abdominal tenderness. There is no guarding or rebound.  Musculoskeletal:     Cervical back: Normal range of motion and neck supple.     Right lower leg: No edema.     Left lower  leg: No edema.  Skin:    General: Skin is warm and dry.     Findings: No rash.  Neurological:     General: No focal deficit present.     Mental Status: She is alert. Mental status is at baseline.     Motor: No weakness.  Psychiatric:        Mood and Affect: Mood normal.     ED Results / Procedures / Treatments   Labs (all labs ordered are listed, but only abnormal results are displayed) Labs Reviewed  URINALYSIS, ROUTINE W REFLEX MICROSCOPIC - Abnormal; Notable for the following components:      Result Value   Leukocytes,Ua MODERATE (*)    Bacteria, UA FEW (*)    All other components within normal limits  URINE CULTURE  CULTURE, BLOOD (ROUTINE X 2)  CULTURE, BLOOD (ROUTINE X 2)  RESP PANEL BY RT-PCR (FLU A&B, COVID) ARPGX2  LACTIC ACID, PLASMA  PROTIME-INR  APTT  LACTIC ACID, PLASMA    EKG EKG Interpretation  Date/Time:  Wednesday December 03 2020 10:42:36 EST Ventricular Rate:  74 PR Interval:    QRS Duration: 97 QT Interval:  432 QTC Calculation: 480 R Axis:   70 Text Interpretation: Sinus rhythm Consider left atrial enlargement Minimal ST depression, inferior leads No acute changes No significant change since last tracing Confirmed by Varney Biles [id number]) on 12/03/2020 1:24:54 PM   Radiology CT Angio Chest PE W and/or Wo Contrast  Result Date: 12/03/2020 CLINICAL DATA:  Concern for pulmonary embolism. History of chronic lymphocytic leukemia. EXAM: CT ANGIOGRAPHY CHEST WITH CONTRAST TECHNIQUE: Multidetector CT imaging of the chest was performed using the standard protocol during bolus administration of intravenous contrast. Multiplanar CT image reconstructions and MIPs were obtained to evaluate the vascular anatomy. CONTRAST:  72mL OMNIPAQUE IOHEXOL 350 MG/ML SOLN COMPARISON:  CT 08/11/2020 FINDINGS: Cardiovascular: No filling defects within the pulmonary arteries to suggest acute pulmonary embolism. Mediastinum/Nodes: Bulky mediastinal perihilar lymphadenopathy  again noted. For example lymph node along the RIGHT bronchus intermedius measuring 25 mm compares to 25 mm. RIGHT lower paratracheal node measuring 18 mm compares to 15 mm. Lungs/Pleura: There is patchy bilateral nodular airspace disease which is worsened from comparison exam. Consolidation and peripheral nodularity in the posterior RIGHT upper lobe on image  65/6 is worsened. There is a consolidation at the LEFT lung base measuring 3.4 cm (image 88/6) which is new. There is atelectasis in the RIGHT middle lobe slightly increased. There is a RIGHT pleural effusion which is new. W Ithin the lung apices there is patchy nodular airspace disease also worsened from comparison exam. Upper Abdomen: A periaortic retroperitoneal adenopathy similar to prior. Spleen appears enlarged. Musculoskeletal: No aggressive osseous lesion Review of the MIP images confirms the above findings. IMPRESSION: 1. No acute pulmonary embolism. 2. New bilateral patchy, nodular and consolidative airspace disease most consistent multifocal pneumonia including atypical pneumonia. Secondary differential would include a pulmonary lymphoma, less favored. 3. Bulky mediastinal and axillary lymphadenopathy related to CLL. No change from prior. 4. Splenomegaly and upper abdominal adenopathy related to CLL. Electronically Signed   By: Suzy Bouchard M.D.   On: 12/03/2020 13:41    Procedures Procedures   Medications Ordered in ED Medications  cefTRIAXone (ROCEPHIN) 1 g in sodium chloride 0.9 % 100 mL IVPB (0 g Intravenous Stopped 12/03/20 1237)  doxycycline (VIBRA-TABS) tablet 100 mg (100 mg Oral Given 12/03/20 1150)  iohexol (OMNIPAQUE) 350 MG/ML injection 75 mL (70 mLs Intravenous Contrast Given 12/03/20 1255)    ED Course  I have reviewed the triage vital signs and the nursing notes.  Pertinent labs & imaging results that were available during my care of the patient were reviewed by me and considered in my medical decision making (see chart  for details).  Patient seen and examined. Work-up initiated. Reviewed recent heme/onc notes.  Additional sepsis labs ordered.  Will begin treatment for community-acquired pneumonia with anticipation of admission.  Patient currently on 3 L nasal cannula and satting in the low 90s.  WBC count today was 155,000, creatinine was 1.55, BUN 22, estimated GFR 35.  Vital signs reviewed and are as follows: BP (!) 125/49   Pulse 74   Temp 99 F (37.2 C) (Oral)   Resp (!) 25   SpO2 93%   2:07 PM CT demonstrates pneumonia, pleural effusion, lymphadenopathy.  Discussed results with patient and sister at bedside.  Will discuss with hospitalist.  IV antibiotics ordered previously.  Covid testing pending.  2:43 PM Discussed with Dr. Marylyn Ishihara who will see patient.   CRITICAL CARE Performed by: Carlisle Cater PA-C Total critical care time: 40 minutes Critical care time was exclusive of separately billable procedures and treating other patients. Critical care was necessary to treat or prevent imminent or life-threatening deterioration. Critical care was time spent personally by me on the following activities: development of treatment plan with patient and/or surrogate as well as nursing, discussions with consultants, evaluation of patient's response to treatment, examination of patient, obtaining history from patient or surrogate, ordering and performing treatments and interventions, ordering and review of laboratory studies, ordering and review of radiographic studies, pulse oximetry and re-evaluation of patient's condition.   HAILLEE JOHANN was evaluated in Emergency Department on 12/03/2020 for the symptoms described in the history of present illness. She was evaluated in the context of the global COVID-19 pandemic, which necessitated consideration that the patient might be at risk for infection with the SARS-CoV-2 virus that causes COVID-19. Institutional protocols and algorithms that pertain to the evaluation  of patients at risk for COVID-19 are in a state of rapid change based on information released by regulatory bodies including the CDC and federal and state organizations. These policies and algorithms were followed during the patient's care in the ED.    MDM Rules/Calculators/A&P  Admit.    Final Clinical Impression(s) / ED Diagnoses Final diagnoses:  Multifocal pneumonia  Acute respiratory failure with hypoxia [facility])    Rx / DC Orders ED Discharge Orders    None       Carlisle Cater, PA-C 12/03/20 1443    Varney Biles, MD 12/03/20 1630

## 2020-12-03 NOTE — Assessment & Plan Note (Signed)
Her extreme leukocytosis is due to her CLL and treatment Clinically, the patient appears to have probable pneumonia We will put her chemotherapy pill on hold for now She needs to be admitted for broad-spectrum IV antibiotics and CT imaging

## 2020-12-03 NOTE — Progress Notes (Signed)
Attempted to call pt's sister in order to do nursing admission history. Her sister does not answer. Lucius Conn BSN, RN-BC Admissions RN 12/03/2020 3:36 PM

## 2020-12-04 DIAGNOSIS — Z7189 Other specified counseling: Secondary | ICD-10-CM | POA: Diagnosis not present

## 2020-12-04 DIAGNOSIS — D696 Thrombocytopenia, unspecified: Secondary | ICD-10-CM

## 2020-12-04 DIAGNOSIS — N183 Chronic kidney disease, stage 3 unspecified: Secondary | ICD-10-CM | POA: Diagnosis not present

## 2020-12-04 DIAGNOSIS — E875 Hyperkalemia: Secondary | ICD-10-CM | POA: Clinically undetermined

## 2020-12-04 DIAGNOSIS — D61818 Other pancytopenia: Secondary | ICD-10-CM

## 2020-12-04 DIAGNOSIS — J849 Interstitial pulmonary disease, unspecified: Secondary | ICD-10-CM | POA: Diagnosis not present

## 2020-12-04 DIAGNOSIS — J8489 Other specified interstitial pulmonary diseases: Secondary | ICD-10-CM | POA: Diagnosis not present

## 2020-12-04 DIAGNOSIS — J189 Pneumonia, unspecified organism: Secondary | ICD-10-CM | POA: Diagnosis not present

## 2020-12-04 DIAGNOSIS — C9110 Chronic lymphocytic leukemia of B-cell type not having achieved remission: Secondary | ICD-10-CM | POA: Diagnosis not present

## 2020-12-04 DIAGNOSIS — J9601 Acute respiratory failure with hypoxia: Secondary | ICD-10-CM | POA: Diagnosis present

## 2020-12-04 DIAGNOSIS — D61810 Antineoplastic chemotherapy induced pancytopenia: Secondary | ICD-10-CM | POA: Diagnosis not present

## 2020-12-04 LAB — COMPREHENSIVE METABOLIC PANEL
ALT: 16 U/L (ref 0–44)
AST: 13 U/L — ABNORMAL LOW (ref 15–41)
Albumin: 3.8 g/dL (ref 3.5–5.0)
Alkaline Phosphatase: 102 U/L (ref 38–126)
Anion gap: 8 (ref 5–15)
BUN: 25 mg/dL — ABNORMAL HIGH (ref 8–23)
CO2: 28 mmol/L (ref 22–32)
Calcium: 9.1 mg/dL (ref 8.9–10.3)
Chloride: 108 mmol/L (ref 98–111)
Creatinine, Ser: 1.39 mg/dL — ABNORMAL HIGH (ref 0.44–1.00)
GFR, Estimated: 40 mL/min — ABNORMAL LOW (ref >60)
Glucose, Bld: 137 mg/dL — ABNORMAL HIGH (ref 70–99)
Potassium: 5.2 mmol/L — ABNORMAL HIGH (ref 3.5–5.1)
Sodium: 144 mmol/L (ref 135–145)
Total Bilirubin: 0.9 mg/dL (ref 0.3–1.2)
Total Protein: 5.9 g/dL — ABNORMAL LOW (ref 6.5–8.1)

## 2020-12-04 LAB — CBC
HCT: 33.9 % — ABNORMAL LOW (ref 36.0–46.0)
Hemoglobin: 9.8 g/dL — ABNORMAL LOW (ref 12.0–15.0)
MCH: 28.2 pg (ref 26.0–34.0)
MCHC: 28.9 g/dL — ABNORMAL LOW (ref 30.0–36.0)
MCV: 97.7 fL (ref 80.0–100.0)
Platelets: 117 10*3/uL — ABNORMAL LOW (ref 150–400)
RBC: 3.47 MIL/uL — ABNORMAL LOW (ref 3.87–5.11)
RDW: 18 % — ABNORMAL HIGH (ref 11.5–15.5)
WBC: 123.8 10*3/uL (ref 4.0–10.5)
nRBC: 0 % (ref 0.0–0.2)

## 2020-12-04 LAB — EXPECTORATED SPUTUM ASSESSMENT W GRAM STAIN, RFLX TO RESP C

## 2020-12-04 LAB — MRSA PCR SCREENING: MRSA by PCR: NEGATIVE

## 2020-12-04 LAB — STREP PNEUMONIAE URINARY ANTIGEN: Strep Pneumo Urinary Antigen: NEGATIVE

## 2020-12-04 LAB — URINE CULTURE: Culture: NO GROWTH

## 2020-12-04 MED ORDER — GUAIFENESIN ER 600 MG PO TB12
1200.0000 mg | ORAL_TABLET | Freq: Two times a day (BID) | ORAL | Status: DC
Start: 2020-12-04 — End: 2020-12-09
  Administered 2020-12-04 – 2020-12-09 (×11): 1200 mg via ORAL
  Filled 2020-12-04 (×10): qty 2

## 2020-12-04 MED ORDER — PANTOPRAZOLE SODIUM 40 MG PO TBEC
40.0000 mg | DELAYED_RELEASE_TABLET | Freq: Every day | ORAL | Status: DC
  Administered 2020-12-04 – 2020-12-09 (×6): 40 mg via ORAL
  Filled 2020-12-04 (×6): qty 1

## 2020-12-04 MED ORDER — HYDROCHLOROTHIAZIDE 25 MG PO TABS
25.0000 mg | ORAL_TABLET | Freq: Every day | ORAL | Status: DC
  Administered 2020-12-04 – 2020-12-09 (×6): 25 mg via ORAL
  Filled 2020-12-04 (×6): qty 1

## 2020-12-04 MED ORDER — HYDROCODONE-HOMATROPINE 5-1.5 MG/5ML PO SYRP
5.0000 mL | ORAL_SOLUTION | Freq: Four times a day (QID) | ORAL | Status: DC | PRN
  Administered 2020-12-04 – 2020-12-06 (×2): 5 mL via ORAL
  Filled 2020-12-04 (×2): qty 5

## 2020-12-04 MED ORDER — IPRATROPIUM-ALBUTEROL 0.5-2.5 (3) MG/3ML IN SOLN
3.0000 mL | Freq: Three times a day (TID) | RESPIRATORY_TRACT | Status: DC
  Administered 2020-12-04 – 2020-12-08 (×12): 3 mL via RESPIRATORY_TRACT
  Filled 2020-12-04 (×11): qty 3

## 2020-12-04 MED ORDER — IPRATROPIUM-ALBUTEROL 0.5-2.5 (3) MG/3ML IN SOLN
RESPIRATORY_TRACT | Status: AC
  Filled 2020-12-04: qty 3

## 2020-12-04 MED ORDER — SODIUM CHLORIDE 0.9% FLUSH
10.0000 mL | INTRAVENOUS | Status: DC | PRN
  Administered 2020-12-09: 10 mL

## 2020-12-04 MED ORDER — CHLORHEXIDINE GLUCONATE CLOTH 2 % EX PADS
6.0000 | MEDICATED_PAD | Freq: Every day | CUTANEOUS | Status: DC
  Administered 2020-12-04 – 2020-12-09 (×4): 6 via TOPICAL

## 2020-12-04 NOTE — Progress Notes (Addendum)
[facility] INPATIENT PROGRESS NOTE  Patient Care Team: Jonathon Jordan, MD as PCP - General (Family Medicine) Heath Lark, MD as Referring Physician (Hematology and Oncology) -I have seen the patient, examined her and agree with documentation as follows I have reviewed the plan of care with her healthcare power of attorney, [name] and primary service  ASSESSMENT & PLAN:  CLL (chronic lymphocytic leukemia) ([place]) Her extreme leukocytosis is due to her CLL and treatment Currently admitted for pneumonia Acalabrutinib has been placed on hold She is receiving broad-spectrum IV antibiotics   Bilateral interstitial pneumonia ([place]) Her last chest x-ray showed bilateral interstitial infiltrate Her Covid test was negative As soon as the patient completed a course of Omnicef and prednisone, she get recurrent cough In our office on 12/03/2020, her oxygen saturation on room air is at 88% and a drop to less than 80% on short distance of walking Given her acute respiratory failure and persistent cough, it was recommended that she be evaluated in the emergency room CTA chest showed bilateral patchy, nodular, and consolidative airspace disease consistent with multifocal pneumonia including atypical pneumonia She is receiving IV antibiotics, steroids, and albuterol On O2 at 4 L/min this morning Sputum culture ordered and is pending Overall, she is improving She will continue broad-spectrum IV antibiotics I am hopeful that she would not need to be discharged on home oxygen   Pancytopenia, acquired ([place]) She has persistent pancytopenia due to treatment She also have B12 deficiency and has received aggressive vitamin B12 supplement Observe closely for now   Goals of care, counseling/discussion Due to the patient's mild baseline mental deficit, her sister/HCPOA makes all her medical decision The patient needs urgent evaluation with imaging study, broad-spectrum IV antibiotics and admission  to the hospital It is not safe to be discharged in the setting of acute respiratory failure  Discharge Planning The patient will need improvement of her hypoxia and overall respiratory status before discharge can be considered Anticipate she will be admitted for at least 3 to 5 days   Mikey Bussing, NP 12/04/2020 10:38 AM Heath Lark, MD  INTERVAL HISTORY: The patient reports improvement in her respiratory status this morning Reports a productive cough with white sputum production She is not having any fevers or chills She is eating and drinking without any difficulty  SUMMARY OF ONCOLOGIC HISTORY: Oncology History Overview Note  FISH del 13 q   CLL (chronic lymphocytic leukemia) ([place])  07/02/2010 Procedure   LN biopsy confirmed CLL   08/18/2010 Procedure   Bone marrow biopsy confirmed CLL   11/18/2010 - 04/15/2011 Chemotherapy   patient received 6 cycles of FLudarabine and Rituximab   01/13/2012 Relapse/Recurrence   Repeat BM biopsy confirmed relapse   01/27/2012 - 06/23/2012 Chemotherapy   Bendamustine & Rituximab given, complicated by infusion reaction to Rituximab, completed 6 cycles   06/29/2013 Imaging   Ct scan confirmed disease relapse with bulky lymphadenopathy   07/23/2013 Procedure   Repeat BM biopsy due to thrombocytopenia and progressive leukocytosis   08/02/2013 Relapse/Recurrence   Patient consented to start iburitinib as salvage therapy for relapsed CLL   11/09/2013 Imaging   Repeat CT scan the chest, abdomen and pelvis showed greater than 50% response to treatment   06/17/2014 Imaging   Repeat CT scan showed near complete response to treatment.   05/30/2015 Imaging   Repeat CT scan showed near complete response to treatment.   06/14/2016 Imaging   CT scan showed stable scattered small retroperitoneal lymph nodes and pelvic  lymph nodes. No findings for recurrent or progressive lymphoma. Stable mild splenomegaly.   12/30/2017 Imaging   1. Recurrent adenopathy  within the chest, abdomen, and pelvis. 2. Splenomegaly. 3.  Aortic Atherosclerosis (ICD10-I70.0). 4. Coronary artery calcifications.   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 The catheter is ready for immediate use.   01/24/2018 -  Chemotherapy   The patient had Gazyva and 1 dose of bendamustine. Bendamustine was discontinued due to inability to get insurance approval   05/08/2018 Imaging   1. Interval partial treatment response. Bilateral axillary, mediastinal, right hilar, retroperitoneal and bilateral pelvic adenopathy is all decreased. Mild splenomegaly, decreased. No new or progressive disease. 2.  Aortic Atherosclerosis (ICD10-I70.0).   01/31/2020 Imaging   1. Since 12/29/2017, marked progression of adenopathy within the chest, abdomen, and pelvis as detailed above. 2. Progressive splenomegaly, consistent with splenic involvement. 3. New pulmonary parenchymal findings which could represent interval infection (including atypical etiologies) or aspiration. 4. Coronary artery atherosclerosis. Aortic Atherosclerosis (ICD10-I70.0).   02/07/2020 - 07/22/2020 Chemotherapy   The patient bendamustine for chemotherapy treatment.     04/28/2020 Imaging   Chest Impression:   1. New consolidation in the LEFT upper lobe is concerning for pneumonia versus lymphoma. Recommend clinical correlation with pulmonary infection. Favor recurrent pulmonary infection in light of RIGHT lung infection demonstrated on CT January 31, 2020. 2. Resolution of ground-glass densities in the RIGHT upper lobe and consolidation in the RIGHT lower lobe consistent resolved pulmonary infection. 3. Persistent enlarged axillary, mediastinal, and hilar lymph nodes.  Overall adenopathy is mildly improved.   Abdomen / Pelvis Impression:   1. Improved bulky retroperitoneal  and iliac lymphadenopathy. The large lymph nodes are measurably decreased in size although remain bulky. 2. Marked splenomegaly also improved.   08/11/2020 Imaging   1. Disease progression, as evidenced by increase in adenopathy within the chest, abdomen, and pelvis. Persistent hepatosplenomegaly, with probable splenic involvement. 2. Although the left upper lobe consolidation has resolved, there are new areas of pulmonary opacity which could represent pulmonary lymphoma or concurrent pneumonia. 3. Coronary artery atherosclerosis. Aortic Atherosclerosis   08/18/2020 -  Chemotherapy   The patient had acalabrutinib for chemotherapy treatment.       REVIEW OF SYSTEMS:   Constitutional: Denies fevers, chills or abnormal weight loss Eyes: Denies blurriness of vision Ears, nose, mouth, throat, and face: Denies mucositis or sore throat Cardiovascular: Denies palpitation, chest discomfort or lower extremity swelling Gastrointestinal:  Denies nausea, heartburn or change in bowel habits Skin: Denies abnormal skin rashes Lymphatics: Denies new lymphadenopathy or easy bruising Neurological:Denies numbness, tingling or new weaknesses Behavioral/Psych: Mood is stable, no new changes  All other systems were reviewed with the patient and are negative.  I have reviewed the past medical history, past surgical history, social history and family history with the patient and they are unchanged from previous note.  ALLERGIES:  is allergic to azithromycin and ibuprofen.  MEDICATIONS:  Current Facility-Administered Medications  Medication Dose Route Frequency Provider Last Rate Last Admin   acetaminophen (TYLENOL) tablet 650 mg  650 mg Oral Q6H PRN Marylyn Ishihara, Tyrone A, DO       acyclovir (ZOVIRAX) tablet 400 mg  400 mg Oral Daily Kyle, Tyrone A, DO   400 mg at 12/04/20 0855   allopurinol (ZYLOPRIM) tablet 300 mg  300 mg Oral Daily Marylyn Ishihara, Tyrone A,  DO   300 mg at 12/04/20 0855   amLODipine (NORVASC) tablet 10 mg   10 mg Oral Daily Marylyn Ishihara, Tyrone A, DO   10 mg at 12/04/20 0856   azelastine (ASTELIN) 0.1 % nasal spray 2 spray  2 spray Each Nare Daily Marylyn Ishihara, Tyrone A, DO   2 spray at 12/04/20 0901   bisoprolol (ZEBETA) tablet 10 mg  10 mg Oral Daily Kyle, Tyrone A, DO   10 mg at 12/04/20 0855   ceFEPIme (MAXIPIME) 2 g in sodium chloride 0.9 % 100 mL IVPB  2 g Intravenous Q12H Pham, Anh P, RPH 200 mL/hr at 12/04/20 0529 2 g at 12/04/20 0529   Chlorhexidine Gluconate Cloth 2 % PADS 6 each  6 each Topical Daily Marylyn Ishihara, Tyrone A, DO   6 each at 12/04/20 2703   cholecalciferol (VITAMIN D3) tablet 2,000 Units  2,000 Units Oral Daily Marylyn Ishihara, Tyrone A, DO   2,000 Units at 12/04/20 0855   citalopram (CELEXA) tablet 20 mg  20 mg Oral q morning Marylyn Ishihara, Tyrone A, DO   20 mg at 12/04/20 0901   enoxaparin (LOVENOX) injection 40 mg  40 mg Subcutaneous Q24H Kyle, Tyrone A, DO   40 mg at 12/03/20 2238   gabapentin (NEURONTIN) capsule 300 mg  300 mg Oral QHS Kyle, Tyrone A, DO   300 mg at 12/03/20 2238   guaiFENesin (MUCINEX) 12 hr tablet 1,200 mg  1,200 mg Oral BID Eugenie Filler, MD   1,200 mg at 12/04/20 0902   HYDROcodone-homatropine (HYCODAN) 5-1.5 MG/5ML syrup 5 mL  5 mL Oral Q6H PRN Eugenie Filler, MD       ipratropium-albuterol (DUONEB) 0.5-2.5 (3) MG/3ML nebulizer solution 3 mL  3 mL Nebulization TID Eugenie Filler, MD   3 mL at 12/04/20 0905   ipratropium-albuterol (DUONEB) 0.5-2.5 (3) MG/3ML nebulizer solution            lidocaine-prilocaine (EMLA) cream 1 application  1 application Topical Daily PRN Marylyn Ishihara, Tyrone A, DO       loratadine (CLARITIN) tablet 10 mg  10 mg Oral QHS Kyle, Tyrone A, DO   10 mg at 12/03/20 2238   mometasone-formoterol (DULERA) 200-5 MCG/ACT inhaler 2 puff  2 puff Inhalation BID Marylyn Ishihara, Tyrone A, DO   2 puff at 12/04/20 0908   multivitamin (PROSIGHT) tablet 1 tablet  1 tablet Oral BID Cherylann Ratel A, DO   1 tablet at 12/04/20 0856   ondansetron (ZOFRAN) tablet 8 mg  8 mg Oral Q8H PRN Marylyn Ishihara,  Tyrone A, DO       pantoprazole (PROTONIX) EC tablet 40 mg  40 mg Oral Daily Eugenie Filler, MD   40 mg at 12/04/20 0904   predniSONE (DELTASONE) tablet 40 mg  40 mg Oral Q breakfast Marylyn Ishihara, Tyrone A, DO   40 mg at 12/04/20 0856   sodium chloride flush (NS) 0.9 % injection 10-40 mL  10-40 mL Intracatheter PRN Cherylann Ratel A, DO       [START ON 12/05/2020] vancomycin (VANCOREADY) IVPB 1250 mg/250 mL  1,250 mg Intravenous Q48H Pham, Anh P, RPH        PHYSICAL EXAMINATION: ECOG PERFORMANCE STATUS: 2 - Symptomatic, <50% confined to bed  Vitals:   12/04/20 0814 12/04/20 0900  BP: 129/60   Pulse: 66   Resp: 17   Temp: 98.3 F (36.8 C)   SpO2: 93% 93%   Filed Weights   12/03/20 2013  Weight: 103.4 kg    GENERAL:alert,  no distress SKIN: skin color, texture, turgor are normal, no rashes or significant lesions EYES: normal, Conjunctiva are pink and non-injected, sclera clear OROPHARYNX:no exudate, no erythema and lips, buccal mucosa, and tongue normal  NECK: supple, thyroid normal size, non-tender, without nodularity LYMPH:  no palpable lymphadenopathy in the cervical, axillary or inguinal LUNGS: Diminished breath sounds HEART: regular rate & rhythm and no murmurs and no lower extremity edema ABDOMEN:abdomen soft, non-tender and normal bowel sounds Musculoskeletal:no cyanosis of digits and no clubbing  NEURO: alert & oriented x 3 with fluent speech, no focal motor/sensory deficits  LABORATORY DATA:  I have reviewed the data as listed    Component Value Date/Time   NA 144 12/04/2020 0313   NA 141 08/23/2017 1026   K 5.2 (H) 12/04/2020 0313   K 3.7 08/23/2017 1026   CL 108 12/04/2020 0313   CL 98 02/02/2013 1353   CO2 28 12/04/2020 0313   CO2 33 (H) 08/23/2017 1026   GLUCOSE 137 (H) 12/04/2020 0313   GLUCOSE 92 08/23/2017 1026   GLUCOSE 99 02/02/2013 1353   BUN 25 (H) 12/04/2020 0313   BUN 21.0 08/23/2017 1026   CREATININE 1.39 (H) 12/04/2020 0313   CREATININE 1.55 (H)  12/03/2020 0950   CREATININE 1.2 (H) 08/23/2017 1026   CALCIUM 9.1 12/04/2020 0313   CALCIUM 9.3 08/23/2017 1026   PROT 5.9 (L) 12/04/2020 0313   PROT 5.9 (L) 03/28/2020 1044   PROT 6.3 (L) 08/23/2017 1026   ALBUMIN 3.8 12/04/2020 0313   ALBUMIN 3.8 08/23/2017 1026   AST 13 (L) 12/04/2020 0313   AST 12 (L) 12/03/2020 0950   AST 11 08/23/2017 1026   ALT 16 12/04/2020 0313   ALT 14 12/03/2020 0950   ALT 10 08/23/2017 1026   ALKPHOS 102 12/04/2020 0313   ALKPHOS 66 08/23/2017 1026   BILITOT 0.9 12/04/2020 0313   BILITOT 0.5 12/03/2020 0950   BILITOT 0.59 08/23/2017 1026   GFRNONAA 40 (L) 12/04/2020 0313   GFRNONAA 35 (L) 12/03/2020 0950   GFRAA 49 (L) 07/21/2020 1120    No results found for: SPEP, UPEP  Lab Results  Component Value Date   WBC 123.8 (HH) 12/04/2020   NEUTROABS 7.8 (H) 12/03/2020   HGB 9.8 (L) 12/04/2020   HCT 33.9 (L) 12/04/2020   MCV 97.7 12/04/2020   PLT 117 (L) 12/04/2020      Chemistry      Component Value Date/Time   NA 144 12/04/2020 0313   NA 141 08/23/2017 1026   K 5.2 (H) 12/04/2020 0313   K 3.7 08/23/2017 1026   CL 108 12/04/2020 0313   CL 98 02/02/2013 1353   CO2 28 12/04/2020 0313   CO2 33 (H) 08/23/2017 1026   BUN 25 (H) 12/04/2020 0313   BUN 21.0 08/23/2017 1026   CREATININE 1.39 (H) 12/04/2020 0313   CREATININE 1.55 (H) 12/03/2020 0950   CREATININE 1.2 (H) 08/23/2017 1026      Component Value Date/Time   CALCIUM 9.1 12/04/2020 0313   CALCIUM 9.3 08/23/2017 1026   ALKPHOS 102 12/04/2020 0313   ALKPHOS 66 08/23/2017 1026   AST 13 (L) 12/04/2020 0313   AST 12 (L) 12/03/2020 0950   AST 11 08/23/2017 1026   ALT 16 12/04/2020 0313   ALT 14 12/03/2020 0950   ALT 10 08/23/2017 1026   BILITOT 0.9 12/04/2020 0313   BILITOT 0.5 12/03/2020 0950   BILITOT 0.59 08/23/2017 1026       RADIOGRAPHIC STUDIES: I  have personally reviewed the radiological images as listed and agreed with the findings in the report. DG Chest 2  View  Result Date: 11/11/2020 CLINICAL DATA:  75 year old female with cough. Chronic lymphocytic leukemia. EXAM: CHEST - 2 VIEW COMPARISON:  Chest radiograph dated 03/24/2018 FINDINGS: Port-A-Cath in similar position. Bilateral streaky and nodular densities most consistent with developing infiltrate, likely viral or atypical in etiology including COVID-19. Clinical correlation is recommended. The cardiac silhouette is within limits. Atherosclerotic calcification of the aorta. Right hilar mass/adenopathy measuring approximately 5 cm in craniocaudal length. No acute osseous pathology. Degenerative changes of the spine. IMPRESSION: 1. Findings most consistent with developing infiltrate. 2. Right hilar mass/adenopathy in keeping with history of CLL. CT may provide better evaluation if clinically indicated. Electronically Signed   By: Anner Crete M.D.   On: 11/11/2020 20:52   CT Angio Chest PE W and/or Wo Contrast  Result Date: 12/03/2020 CLINICAL DATA:  Concern for pulmonary embolism. History of chronic lymphocytic leukemia. EXAM: CT ANGIOGRAPHY CHEST WITH CONTRAST TECHNIQUE: Multidetector CT imaging of the chest was performed using the standard protocol during bolus administration of intravenous contrast. Multiplanar CT image reconstructions and MIPs were obtained to evaluate the vascular anatomy. CONTRAST:  13m OMNIPAQUE IOHEXOL 350 MG/ML SOLN COMPARISON:  CT 08/11/2020 FINDINGS: Cardiovascular: No filling defects within the pulmonary arteries to suggest acute pulmonary embolism. Mediastinum/Nodes: Bulky mediastinal perihilar lymphadenopathy again noted. For example lymph node along the RIGHT bronchus intermedius measuring 25 mm compares to 25 mm. RIGHT lower paratracheal node measuring 18 mm compares to 15 mm. Lungs/Pleura: There is patchy bilateral nodular airspace disease which is worsened from comparison exam. Consolidation and peripheral nodularity in the posterior RIGHT upper lobe on image 65/6 is  worsened. There is a consolidation at the LEFT lung base measuring 3.4 cm (image 88/6) which is new. There is atelectasis in the RIGHT middle lobe slightly increased. There is a RIGHT pleural effusion which is new. W Ithin the lung apices there is patchy nodular airspace disease also worsened from comparison exam. Upper Abdomen: A periaortic retroperitoneal adenopathy similar to prior. Spleen appears enlarged. Musculoskeletal: No aggressive osseous lesion Review of the MIP images confirms the above findings. IMPRESSION: 1. No acute pulmonary embolism. 2. New bilateral patchy, nodular and consolidative airspace disease most consistent multifocal pneumonia including atypical pneumonia. Secondary differential would include a pulmonary lymphoma, less favored. 3. Bulky mediastinal and axillary lymphadenopathy related to CLL. No change from prior. 4. Splenomegaly and upper abdominal adenopathy related to CLL. Electronically Signed   By: SSuzy BouchardM.D.   On: 12/03/2020 13:41   MM DIAG BREAST TOMO UNI LEFT  Result Date: 11/10/2020 CLINICAL DATA:  Callback from screening mammogram for possible asymmetry left breast EXAM: DIGITAL DIAGNOSTIC UNILATERAL LEFT MAMMOGRAM WITH TOMO AND CAD TECHNIQUE: Left digital diagnostic mammography and breast tomosynthesis was performed. Digital images of the breasts were evaluated with computer-aided detection. COMPARISON:  Prior films ACR Breast Density Category b: There are scattered areas of fibroglandular density. FINDINGS: Cc and MLO views of the left breast are submitted. Previously questioned asymmetry in the medial left breast does not persist on additional views. No suspicious abnormality is identified. IMPRESSION: Benign findings. RECOMMENDATION: Routine screening mammogram back on schedule. I have discussed the findings and recommendations with the patient. If applicable, a reminder letter will be sent to the patient regarding the next appointment. BI-RADS CATEGORY  2:  Benign. Electronically Signed   By: WAbelardo DieselM.D.   On: 11/10/2020 10:27

## 2020-12-04 NOTE — TOC Initial Note (Signed)
Transition of Care [facility]) - Initial/Assessment Note    Patient Details  Name: Erin Moore MRN: [id number] Date of Birth: 1946/08/12  [facility] CM/SW Contact:    Erin Pall, LCSW Phone Number: 12/04/2020, 2:52 PM  Clinical Narrative:                 Met with pt today to introduce self/ role and discuss dc plans. Pt reports that she has been a resident at [facility] x 6 years and has been very happy there.  Fully intends to return to facility when ready for dc.  She does have local, supportive family as well.  She is very pleasant and agreeable for TOC to follow for any dc needs.   Expected Discharge Plan: Assisted Living Barriers to Discharge: Continued Medical Work up   Patient Goals and CMS Choice Patient states their goals for this hospitalization and ongoing recovery are:: return to her apartment at [facility]      Expected Discharge Plan and Services Expected Discharge Plan: Assisted Living In-house Referral: Clinical Social Work     Living arrangements for the past 2 months: [place]                                      Prior Living Arrangements/Services Living arrangements for the past 2 months: [place] Lives with:: Facility Resident Patient language and need for interpreter reviewed:: Yes Do you feel safe going back to the place where you live?: Yes      Need for Family Participation in Patient Care: No (Comment) Care giver support system in place?: Yes (comment)   Criminal Activity/Legal Involvement Pertinent to Current Situation/Hospitalization: No - Comment as needed  Activities of Daily Living Home Assistive Devices/Equipment: [place] Rile (specify type) ADL Screening (condition at time of admission) Patient's cognitive ability adequate to safely complete daily activities?: Yes Is the patient deaf or have difficulty hearing?: No Does the patient have difficulty seeing, even when wearing  glasses/contacts?: No Does the patient have difficulty concentrating, remembering, or making decisions?: No Patient able to express need for assistance with ADLs?: Yes Does the patient have difficulty dressing or bathing?: No Independently performs ADLs?: Yes (appropriate for developmental age) Does the patient have difficulty walking or climbing stairs?: No Weakness of Legs: Both Weakness of Arms/Hands: None  Permission Sought/Granted Permission sought to share information with : Facility Contact Representative,Family Supports Permission granted to share information with : Yes, Verbal Permission Granted  Share Information with NAME: Erin Moore     Permission granted to share info w Relationship: sister  Permission granted to share info w Contact Information: [phone]  Emotional Assessment Appearance:: Appears stated age Attitude/Demeanor/Rapport: Gracious,Engaged Affect (typically observed): Accepting,Pleasant Orientation: : Oriented to Self,Oriented to Place,Oriented to  Time,Oriented to Situation   Psych Involvement: No (comment)  Admission diagnosis:  Pneumonia [J18.9] Acute respiratory failure with hypoxia ([place]) [J96.01] Multifocal pneumonia [J18.9] Patient Active Problem List   Diagnosis Date Noted  . Pneumonia 12/03/2020  . Multifocal pneumonia 12/03/2020  . Bilateral interstitial pneumonia ([place]) 08/12/2020  . Left upper lobe pneumonia 04/29/2020  . CKD (chronic kidney disease), symptom management only, stage 3 (moderate) ([place]) 03/04/2020  . Peripheral neuropathy 02/14/2020  . Vitamin B12 deficiency 03/01/2019  . Splenomegaly 05/10/2018  . Cough 03/24/2018  . Fall at home, initial encounter 02/21/2018  . Hyperuricemia 01/02/2018  . Goals of care,  counseling/discussion 01/02/2018  . Pancytopenia, acquired ([place]) 12/22/2017  . Mental disability   . Thrombocytopenia, unspecified ([place]) 07/18/2013  . Other fatigue 07/18/2013  . Essential hypertension 01/20/2012   . Obesity, Class III, BMI 40-49.9 (morbid obesity) ([place]) 12/13/2011  . CLL (chronic lymphocytic leukemia) ([place]) 12/07/2011   PCP:  Jonathon Jordan, MD Pharmacy:   [facility] [place] [address] Phone: [phone] Fax: [phone]     Social Determinants of Health (SDOH) Interventions    Readmission Risk Interventions Readmission Risk Prevention Plan 12/04/2020  Transportation Screening Complete  PCP or Specialist Appt within 5-7 Days Complete  Home Care Screening Complete  Some recent data might be hidden

## 2020-12-04 NOTE — Progress Notes (Signed)
PROGRESS NOTE    Erin Moore  EZM:[id number] DOB: [birth year] DOA: 12/03/2020 PCP: Jonathon Jordan, MD    Chief Complaint  Patient presents with  . Shortness of Breath    Brief Narrative: Patient pleasant 75 year old female, history of CLL, hypertension, anxiety, mental disability/learning disorder being followed at the cancer center who presented to the cancer center with acute respiratory failure with hypoxia with sats in the 80s noted to be short of breath on minimal exertion.  Patient prior to this had been treated with oral steroids and antibiotics for symptoms of cough which had improved and subsequently returned after course of treatment was done.  Patient placed on 4 L nasal cannula.  CT angio chest which was done was negative for PE however consistent with bilateral multifocal pneumonia.  Patient admitted placed empirically on IV antibiotics.  Oncology following.   Assessment & Plan:   Principal Problem:   Acute respiratory failure with hypoxia (HCC) Active Problems:   Multifocal pneumonia   CLL (chronic lymphocytic leukemia) (HCC)   Thrombocytopenia, unspecified (HCC)   Pancytopenia, acquired (HCC)   CKD (chronic kidney disease), symptom management only, stage 3 (moderate) (HCC)   Bilateral interstitial pneumonia (HCC)   Pneumonia   Hyperkalemia  1 acute hypoxic respiratory failure secondary to multifocal pneumonia Patient presented with acute hypoxic respiratory failure.  Patient noted to have had a cough a few weeks ago treated with cefdinir and prednisone with some intermittent improvement with symptoms however symptoms returned with dyspnea on minimal exertion after course of treatment was done.  CT angiogram chest done negative for PE however consistent with multifocal pneumonia. -Patient states feeling better than on admission. -Patient pancultured.  Sputum Gram stain and culture pending. -MRSA PCR negative.  SARS coronavirus 2 PCR negative.  Influenza A and B PCR  negative.  Urine strep pneumococcus antigen negative. -Continue empiric IV cefepime. -DC IV vancomycin. -Placed on Duonebs, Claritin, PPI.  Continue Dulera, prednisone.  Increase Mucinex to 1200 mg twice daily.  Supportive care. -Check an ambulatory sats in the next 24 to 48 hours.  2.  CLL Elevated leukocytosis secondary to CLL and treatment per oncology.  Patient treatment currently on hold.  Patient on IV antibiotics. -Per oncology.  3.  Acquired pancytopenia/thrombocytopenia/normocytic anemia Likely secondary to chemo treatment.  Patient with no overt bleeding.  Hemoglobin currently at 9.8, platelet count of 117.  Transfusion threshold hemoglobin < 7.  Per oncology.  4.  Chronic kidney disease stage IIIb Currently stable.  Follow.  5.  Hyperkalemia Repeat labs in the morning.  Cozaar on hold.  6.  Hypertension Continue Zebeta.  Resume HCTZ and Norvasc.  Continue to hold Cozaar   DVT prophylaxis: Lovenox Code Status: Full Family Communication: Updated patient.  No family at bedside.  Oncology stated will update family on a daily basis. Disposition:   Status is: Inpatient    Dispo: The patient is from: Group home              Anticipated d/c is to: Likely back to group home              Anticipated d/c date is: 3 to 4 days              Patient currently on IV antibiotics, with hypoxia, being treated for multifocal pneumonia.  Not stable for discharge.   Difficult to place patient no       Consultants:   Oncology: Dr. Alvy Bimler  Procedures:   CT angiogram chest 12/03/2020  Antimicrobials:  IV cefepime 12/03/2020>>>>  IV vancomycin 12/03/2020>>>> 12/04/2020   Subjective: Patient is feeling better than on admission.  Feels shortness of breath is improving.  Objective: Vitals:   12/04/20 0900 12/04/20 1225 12/04/20 1427 12/04/20 1558  BP:  131/62  123/60  Pulse:  70  70  Resp:  16  17  Temp:  97.6 F (36.4 C)  98.3 F (36.8 C)  TempSrc:  Oral  Oral   SpO2: 93% 95% 94% 95%  Weight:      Height:        Intake/Output Summary (Last 24 hours) at 12/04/2020 1654 Last data filed at 12/04/2020 1400 Gross per 24 hour  Intake 1080.64 ml  Output 1100 ml  Net -19.36 ml   Filed Weights   12/03/20 2013  Weight: 103.4 kg    Examination:  General exam: NAD Respiratory system: Diffuse coarse breath sounds.  No wheezing.  Fair air movement.  Speaking in full sentences.  Cardiovascular system: S1 & S2 heard, RRR. No JVD, murmurs, rubs, gallops or clicks. No pedal edema. Gastrointestinal system: Abdomen is nondistended, soft and nontender. No organomegaly or masses felt. Normal bowel sounds heard. Central nervous system: Alert and oriented. No focal neurological deficits. Extremities: Symmetric 5 x 5 power. Skin: No rashes, lesions or ulcers Psychiatry: Judgement and insight appear normal. Mood & affect appropriate.     Data Reviewed: I have personally reviewed following labs and imaging studies  CBC: Recent Labs  Lab 12/03/20 0950 12/03/20 2034 12/04/20 0313  WBC 155.4* 108.5* 123.8*  NEUTROABS 7.8*  --   --   HGB 10.3* 9.7* 9.8*  HCT 35.5* 33.4* 33.9*  MCV 95.2 97.1 97.7  PLT 133* 118* 117*    Basic Metabolic Panel: Recent Labs  Lab 12/03/20 0950 12/03/20 2034 12/04/20 0313  NA 142  --  144  K 4.3  --  5.2*  CL 106  --  108  CO2 28  --  28  GLUCOSE 98  --  137*  BUN 22  --  25*  CREATININE 1.55* 1.78* 1.39*  CALCIUM 9.1  --  9.1    GFR: Estimated Creatinine Clearance: 40 mL/min (A) (by C-G formula based on SCr of 1.39 mg/dL (H)).  Liver Function Tests: Recent Labs  Lab 12/03/20 0950 12/04/20 0313  AST 12* 13*  ALT 14 16  ALKPHOS 117 102  BILITOT 0.5 0.9  PROT 5.9* 5.9*  ALBUMIN 3.6 3.8    CBG: No results for input(s): GLUCAP in the last 168 hours.   Recent Results (from the past 240 hour(s))  Blood Culture (routine x 2)     Status: None (Preliminary result)   Collection Time: 12/03/20 11:02 AM    Specimen: BLOOD  Result Value Ref Range Status   Specimen Description   Final    BLOOD SITE NOT SPECIFIED Performed at [facility], [address], [place], [place] [address]    Special Requests   Final    BOTTLES DRAWN AEROBIC AND ANAEROBIC Blood Culture adequate volume Performed at [facility] [address] [place], [place] [address]    Culture   Final    NO GROWTH < 24 HOURS Performed at [facility] [address], [place], [place] [address]    Report Status PENDING  Incomplete  Blood Culture (routine x 2)     Status: None (Preliminary result)   Collection Time: 12/03/20 11:33 AM   Specimen: BLOOD  Result Value Ref  Range Status   Specimen Description   Final    BLOOD RIGHT ANTECUBITAL Performed at [facility] [address], [place], [place] [address]    Special Requests   Final    BOTTLES DRAWN AEROBIC AND ANAEROBIC Blood Culture adequate volume Performed at [facility] [address], [place], [place] [address]    Culture   Final    NO GROWTH < 24 HOURS Performed at [facility] [address] [place], [place] [address]    Report Status PENDING  Incomplete  Urine culture     Status: None   Collection Time: 12/03/20 12:34 PM   Specimen: In/Out Cath Urine  Result Value Ref Range Status   Specimen Description   Final    IN/OUT CATH URINE Performed at [facility] [address], [place], [place] [address]    Special Requests   Final    NONE Performed at [facility], [place] [address] [place] [address], [place], [place] [address]    Culture   Final    NO GROWTH Performed at [facility], [place] [address], [place],  [address]    Report Status 12/04/2020 FINAL  Final  Resp Panel by RT-PCR (Flu A&B, Covid) Nasopharyngeal Swab     Status: None   Collection Time: 12/03/20  3:20 PM   Specimen: Nasopharyngeal Swab; Nasopharyngeal(NP) swabs in vial  transport medium  Result Value Ref Range Status   SARS Coronavirus 2 by RT PCR NEGATIVE NEGATIVE Final    Comment: (NOTE) SARS-CoV-2 target nucleic acids are NOT DETECTED.  The SARS-CoV-2 RNA is generally detectable in upper respiratory specimens during the acute phase of infection. The lowest concentration of SARS-CoV-2 viral copies this assay can detect is 138 copies/mL. A negative result does not preclude SARS-Cov-2 infection and should not be used as the sole basis for treatment or other patient management decisions. A negative result may occur with  improper specimen collection/handling, submission of specimen other than nasopharyngeal swab, presence of viral mutation(s) within the areas targeted by this assay, and inadequate number of viral copies(<138 copies/mL). A negative result must be combined with clinical observations, patient history, and epidemiological information. The expected result is Negative.  Fact Sheet for Patients:  EntrepreneurPulse.com.au  Fact Sheet for Healthcare Providers:  IncredibleEmployment.be  This test is no t yet approved or cleared by the [place] FDA and  has been authorized for detection and/or diagnosis of SARS-CoV-2 by FDA under an Emergency Use Authorization (EUA). This EUA will remain  in effect (meaning this test can be used) for the duration of the COVID-19 declaration under Section 564(b)(1) of the Act, 21 U.S.C.section 360bbb-3(b)(1), unless the authorization is terminated  or revoked sooner.       Influenza A by PCR NEGATIVE NEGATIVE Final   Influenza B by PCR NEGATIVE NEGATIVE Final    Comment: (NOTE) The Xpert Xpress SARS-CoV-2/FLU/RSV plus assay is intended as an aid in the diagnosis of influenza from Nasopharyngeal swab specimens and should not be used as a sole basis for treatment. Nasal washings and aspirates are unacceptable for Xpert Xpress SARS-CoV-2/FLU/RSV testing.  Fact  Sheet for Patients: EntrepreneurPulse.com.au  Fact Sheet for Healthcare Providers: IncredibleEmployment.be  This test is not yet approved or cleared by the [place] FDA and has been authorized for detection and/or diagnosis of SARS-CoV-2 by FDA under an Emergency Use Authorization (EUA). This EUA will remain in effect (meaning this test can be used) for the  duration of the COVID-19 declaration under Section 564(b)(1) of the Act, 21 U.S.C. section 360bbb-3(b)(1), unless the authorization is terminated or revoked.  Performed at [facility], [place] [address] [place], [place] [address]   MRSA PCR Screening     Status: None   Collection Time: 12/04/20 12:00 AM   Specimen: Nasopharyngeal  Result Value Ref Range Status   MRSA by PCR NEGATIVE NEGATIVE Final    Comment:        The GeneXpert MRSA Assay (FDA approved for NASAL specimens only), is one component of a comprehensive MRSA colonization surveillance program. It is not intended to diagnose MRSA infection nor to guide or monitor treatment for MRSA infections. Performed at [facility], [place] [address], [place], [place] [address]   Culture, sputum-assessment     Status: None   Collection Time: 12/04/20  5:20 AM   Specimen: Expectorated Sputum  Result Value Ref Range Status   Specimen Description EXPECTORATED SPUTUM  Final   Special Requests Immunocompromised  Final   Sputum evaluation   Final    THIS SPECIMEN IS ACCEPTABLE FOR SPUTUM CULTURE Performed at [facility], [place] [address] [place], [place] [address]    Report Status 12/04/2020 FINAL  Final  Culture, Respiratory w Gram Stain     Status: None (Preliminary result)   Collection Time: 12/04/20  5:20 AM  Result Value Ref Range Status   Specimen Description   Final    EXPECTORATED SPUTUM Performed at [facility] [address], [place], [place]  [address]    Special Requests   Final    Immunocompromised Reflexed from [id number] Performed at [facility], [place] [address] [place], [place] [address]    Gram Stain   Final    FEW WBC PRESENT, PREDOMINANTLY PMN RARE GRAM POSITIVE COCCI RARE GRAM NEGATIVE RODS Performed at [facility], [place] [address], [place], [place] [address]    Culture PENDING  Incomplete   Report Status PENDING  Incomplete         Radiology Studies: CT Angio Chest PE W and/or Wo Contrast  Result Date: 12/03/2020 CLINICAL DATA:  Concern for pulmonary embolism. History of chronic lymphocytic leukemia. EXAM: CT ANGIOGRAPHY CHEST WITH CONTRAST TECHNIQUE: Multidetector CT imaging of the chest was performed using the standard protocol during bolus administration of intravenous contrast. Multiplanar CT image reconstructions and MIPs were obtained to evaluate the vascular anatomy. CONTRAST:  20mL OMNIPAQUE IOHEXOL 350 MG/ML SOLN COMPARISON:  CT 08/11/2020 FINDINGS: Cardiovascular: No filling defects within the pulmonary arteries to suggest acute pulmonary embolism. Mediastinum/Nodes: Bulky mediastinal perihilar lymphadenopathy again noted. For example lymph node along the RIGHT bronchus intermedius measuring 25 mm compares to 25 mm. RIGHT lower paratracheal node measuring 18 mm compares to 15 mm. Lungs/Pleura: There is patchy bilateral nodular airspace disease which is worsened from comparison exam. Consolidation and peripheral nodularity in the posterior RIGHT upper lobe on image 65/6 is worsened. There is a consolidation at the LEFT lung base measuring 3.4 cm (image 88/6) which is new. There is atelectasis in the RIGHT middle lobe slightly increased. There is a RIGHT pleural effusion which is new. W Ithin the lung apices there is patchy nodular airspace disease also worsened from comparison exam. Upper Abdomen: A periaortic retroperitoneal adenopathy similar to prior. Spleen appears enlarged. Musculoskeletal:  No aggressive osseous lesion Review of the MIP images confirms the above findings. IMPRESSION: 1. No acute pulmonary embolism. 2. New bilateral patchy, nodular  and consolidative airspace disease most consistent multifocal pneumonia including atypical pneumonia. Secondary differential would include a pulmonary lymphoma, less favored. 3. Bulky mediastinal and axillary lymphadenopathy related to CLL. No change from prior. 4. Splenomegaly and upper abdominal adenopathy related to CLL. Electronically Signed   By: Suzy Bouchard M.D.   On: 12/03/2020 13:41        Scheduled Meds: . acyclovir  400 mg Oral Daily  . allopurinol  300 mg Oral Daily  . amLODipine  10 mg Oral Daily  . azelastine  2 spray Each Nare Daily  . bisoprolol  10 mg Oral Daily  . Chlorhexidine Gluconate Cloth  6 each Topical Daily  . cholecalciferol  2,000 Units Oral Daily  . citalopram  20 mg Oral q morning  . enoxaparin (LOVENOX) injection  40 mg Subcutaneous Q24H  . gabapentin  300 mg Oral QHS  . guaiFENesin  1,200 mg Oral BID  . ipratropium-albuterol  3 mL Nebulization TID  . ipratropium-albuterol      . loratadine  10 mg Oral QHS  . mometasone-formoterol  2 puff Inhalation BID  . multivitamin  1 tablet Oral BID  . pantoprazole  40 mg Oral Daily  . predniSONE  40 mg Oral Q breakfast   Continuous Infusions: . ceFEPime (MAXIPIME) IV 2 g (12/04/20 0529)     LOS: 1 day    Time spent: 35 minutes    Irine Seal, MD [facility]   To contact the attending provider between 7A-7P or the covering provider during after hours 7P-7A, please log into the web site [email or web address] and access using universal [place] password for that web site. If you do not have the password, please call the hospital operator.  12/04/2020, 4:54 PM

## 2020-12-05 DIAGNOSIS — J849 Interstitial pulmonary disease, unspecified: Secondary | ICD-10-CM | POA: Diagnosis not present

## 2020-12-05 DIAGNOSIS — N183 Chronic kidney disease, stage 3 unspecified: Secondary | ICD-10-CM

## 2020-12-05 DIAGNOSIS — J9601 Acute respiratory failure with hypoxia: Secondary | ICD-10-CM | POA: Diagnosis not present

## 2020-12-05 DIAGNOSIS — J189 Pneumonia, unspecified organism: Secondary | ICD-10-CM | POA: Diagnosis not present

## 2020-12-05 DIAGNOSIS — J8489 Other specified interstitial pulmonary diseases: Secondary | ICD-10-CM | POA: Diagnosis not present

## 2020-12-05 DIAGNOSIS — C9110 Chronic lymphocytic leukemia of B-cell type not having achieved remission: Secondary | ICD-10-CM | POA: Diagnosis not present

## 2020-12-05 DIAGNOSIS — D61810 Antineoplastic chemotherapy induced pancytopenia: Secondary | ICD-10-CM | POA: Diagnosis not present

## 2020-12-05 LAB — CBC WITH DIFFERENTIAL/PLATELET
Abs Immature Granulocytes: 0 10*3/uL (ref 0.00–0.07)
Basophils Absolute: 0 10*3/uL (ref 0.0–0.1)
Basophils Relative: 0 %
Eosinophils Absolute: 0 10*3/uL (ref 0.0–0.5)
Eosinophils Relative: 0 %
HCT: 31 % — ABNORMAL LOW (ref 36.0–46.0)
Hemoglobin: 8.7 g/dL — ABNORMAL LOW (ref 12.0–15.0)
Lymphocytes Relative: 88 %
Lymphs Abs: 120.4 10*3/uL — ABNORMAL HIGH (ref 0.7–4.0)
MCH: 27.4 pg (ref 26.0–34.0)
MCHC: 28.1 g/dL — ABNORMAL LOW (ref 30.0–36.0)
MCV: 97.8 fL (ref 80.0–100.0)
Monocytes Absolute: 2.7 10*3/uL — ABNORMAL HIGH (ref 0.1–1.0)
Monocytes Relative: 2 %
Neutro Abs: 13.7 10*3/uL — ABNORMAL HIGH (ref 1.7–7.7)
Neutrophils Relative %: 10 %
Platelets: 126 10*3/uL — ABNORMAL LOW (ref 150–400)
RBC: 3.17 MIL/uL — ABNORMAL LOW (ref 3.87–5.11)
RDW: 17.8 % — ABNORMAL HIGH (ref 11.5–15.5)
WBC: 136.8 10*3/uL (ref 4.0–10.5)
nRBC: 0 % (ref 0.0–0.2)

## 2020-12-05 LAB — LEGIONELLA PNEUMOPHILA SEROGP 1 UR AG: L. pneumophila Serogp 1 Ur Ag: NEGATIVE

## 2020-12-05 LAB — BASIC METABOLIC PANEL
Anion gap: 9 (ref 5–15)
BUN: 32 mg/dL — ABNORMAL HIGH (ref 8–23)
CO2: 27 mmol/L (ref 22–32)
Calcium: 8.7 mg/dL — ABNORMAL LOW (ref 8.9–10.3)
Chloride: 106 mmol/L (ref 98–111)
Creatinine, Ser: 1.11 mg/dL — ABNORMAL HIGH (ref 0.44–1.00)
GFR, Estimated: 52 mL/min — ABNORMAL LOW (ref >60)
Glucose, Bld: 127 mg/dL — ABNORMAL HIGH (ref 70–99)
Potassium: 4.1 mmol/L (ref 3.5–5.1)
Sodium: 142 mmol/L (ref 135–145)

## 2020-12-05 LAB — MAGNESIUM: Magnesium: 2 mg/dL (ref 1.7–2.4)

## 2020-12-05 MED FILL — CALQUENCE 100 MG CAPSULE: 100 | 30 days supply | Qty: 60 | Fill #4

## 2020-12-05 NOTE — Care Management Important Message (Signed)
Important Message  Patient Details IM Letter given to the Patient. Name: Erin Moore MRN: [id number] Date of Birth: 1946-06-30   Medicare Important Message Given:  Yes     [name] 12/05/2020, 1:31 PM

## 2020-12-05 NOTE — Progress Notes (Signed)
SATURATION QUALIFICATIONS: (This note is used to comply with regulatory documentation for home oxygen)  Patient Saturations on Room Air at Rest =92  Patient Saturations on Room Air while Ambulating = 86-88 %  Patient Saturations on 0 Liters of oxygen while Ambulating =  Please briefly explain why patient needs home oxygen: Oxygen saturation dropped below 90 while ambulating, pt has to stop and rest

## 2020-12-05 NOTE — Evaluation (Signed)
Occupational Therapy Evaluation Patient Details Name: Erin Moore MRN: [id number] DOB: 1945-12-25 Today's Date: 12/05/2020    History of Present Illness 75 year old female, history of CLL, HTN, anxiety, mental disability/learning disorder being followed at cancer center who presented to the cancer center with acute respiratory failure with hypoxia with sats in the 80s noted to be short of breath on minimal exertion   Clinical Impression   Patient resides at ALF, is mod I with self care and ambulates to dining room for meals with rollator. Patient is at her baseline with BADLs, desat on RA seated in chair to 88%. Donned 2L for functional ambulation in hallway pt reading 89-90% with ambulation. Instructed patient in incentive spirometer and encouraged pt to bring back to ALF with her, pt verbalize understanding. Also encourage pt to continue ambulating with nursing. No further acute OT needs at this time, please re-consult if new needs arise.    Follow Up Recommendations  No OT follow up    Equipment Recommendations  None recommended by OT       Precautions / Restrictions Precautions Precaution Comments: monitor sats Restrictions Weight Bearing Restrictions: No      Mobility Bed Mobility               General bed mobility comments: in chair    Transfers Overall transfer level: Modified independent Equipment used: 4-wheeled walker                  Balance Overall balance assessment: Mild deficits observed, not formally tested                                         ADL either performed or assessed with clinical judgement   ADL Overall ADL's : At baseline                                       General ADL Comments: patient able to perform LB dressing, functional transfers and ambulation without physical assistance. O2 between 89-90% on 2L with activity, RN aware                  Pertinent Vitals/Pain Pain Assessment:  No/denies pain     Hand Dominance Right   Extremity/Trunk Assessment Upper Extremity Assessment Upper Extremity Assessment: Overall WFL for tasks assessed   Lower Extremity Assessment Lower Extremity Assessment: Defer to PT evaluation       Communication Communication Communication: No difficulties   Cognition Arousal/Alertness: Awake/alert Behavior During Therapy: WFL for tasks assessed/performed Overall Cognitive Status: History of cognitive impairments - at baseline                                        Exercises Exercises: Other exercises Other Exercises Other Exercises: IS x10, cues for technique        Home Living Family/patient expects to be discharged to:: Assisted living                             Home Equipment: Walker - 4 wheels          Prior Functioning/Environment Level of Independence: Independent with assistive device(s)  Comments: ambulates with rollator, goes to dining room for meals        OT Problem List: Cardiopulmonary status limiting activity         OT Goals(Current goals can be found in the care plan section) Acute Rehab OT Goals Patient Stated Goal: back to ALF OT Goal Formulation: All assessment and education complete, DC therapy   AM-PAC OT "6 Clicks" Daily Activity     Outcome Measure Help from another person eating meals?: None Help from another person taking care of personal grooming?: None Help from another person toileting, which includes using toliet, bedpan, or urinal?: None Help from another person bathing (including washing, rinsing, drying)?: None Help from another person to put on and taking off regular upper body clothing?: None Help from another person to put on and taking off regular lower body clothing?: None 6 Click Score: 24   End of Session Equipment Utilized During Treatment: Oxygen;Other (comment) (rollator) Nurse Communication: Mobility status;Other (comment) (O2 with  amb)  Activity Tolerance: Patient tolerated treatment well Patient left: in chair;with call bell/phone within reach;with family/visitor present  OT Visit Diagnosis: Unsteadiness on feet (R26.81)                Time: 7494-4967 OT Time Calculation (min): 26 min Charges:  OT General Charges $OT Visit: 1 Visit OT Evaluation $OT Eval Low Complexity: [place] OT OT pager: [place] 12/05/2020, 2:08 PM

## 2020-12-05 NOTE — Progress Notes (Signed)
Erin Moore   DOB:1946-07-20   DX#:[id number]    ASSESSMENT & PLAN:  CLL (chronic lymphocytic leukemia) ([place]) Her extreme leukocytosis is due to her CLL and treatment Currently admitted for pneumonia Acalabrutinib has been placed on hold She is receiving broad-spectrum IV antibiotics We will resume her treatment outpatient  Bilateral interstitial pneumonia (HCC) Her last chest x-ray showed bilateral interstitial infiltrate Her Covid test was negative Overall, her symptoms are improved but she still have significant productive cough and hypoxia She will continue broad-spectrum IV antibiotics  Pancytopenia, acquired ([place]) She has persistent pancytopenia due to treatment She also have B12 deficiency and has received aggressive vitamin B12 supplement Observe closely for now  Chronic kidney disease stage III Monitor closely  Goals of care, counseling/discussion Due to the patient's mild baseline mental deficit, her sister/HCPOA makes all her medical decision I will update her sister today  Discharge Planning The patient will need improvement of her hypoxia and overall respiratory status before discharge can be considered Overall, her symptoms are much improved but I do not believe she is ready to be discharged today I would defer the decision to primary service If she is still here on Monday, I will return to check on her but if she is discharged over the weekend, I will set up outpatient appointment   All questions were answered. The patient knows to call the clinic with any problems, questions or concerns.  Heath Lark, MD 12/05/2020 8:00 AM  Subjective:  She feels better She has been afebrile She still having significant cough but much improved since admission She denies significant shortness of breath on exertion  Objective:  Vitals:   12/05/20 0735 12/05/20 0741  BP:    Pulse:    Resp:    Temp:    SpO2: (!) 89% (!) 89%     Intake/Output Summary (Last 24 hours)  at 12/05/2020 0800 Last data filed at 12/05/2020 0600 Gross per 24 hour  Intake 860 ml  Output 850 ml  Net 10 ml    GENERAL:alert, no distress and comfortable LUNGS: Bilateral scattered wheeze but much improved compared to prior visit NEURO: alert & oriented x 3 with fluent speech, no focal motor/sensory deficits   Labs:  Recent Labs    05/05/20 1007 06/09/20 1037 07/21/20 1120 08/25/20 1129 11/11/20 1320 12/03/20 0950 12/03/20 2034 12/04/20 0313 12/05/20 0310  NA 142 140 142   < > 141 142  --  144 142  K 4.4 3.8 4.2   < > 4.4 4.3  --  5.2* 4.1  CL 103 103 105   < > 106 106  --  108 106  CO2 26 27 29    < > 26 28  --  28 27  GLUCOSE 106* 106* 102*   < > 103* 98  --  137* 127*  BUN 29* 22 26*   < > 26* 22  --  25* 32*  CREATININE 1.18* 1.29* 1.26*   < > 1.47* 1.55* 1.78* 1.39* 1.11*  CALCIUM 9.7 9.7 9.5   < > 9.2 9.1  --  9.1 8.7*  GFRNONAA 45* 41* 42*   < > 37* 35* 30* 40* 52*  GFRAA 53* 47* 49*  --   --   --   --   --   --   PROT 6.3* 5.9* 6.1*   < > 6.0* 5.9*  --  5.9*  --   ALBUMIN 4.3 3.7 3.8   < > 3.9 3.6  --  3.8  --   AST 11* 14* 19   < > 13* 12*  --  13*  --   ALT 12 10 14    < > 8 14  --  16  --   ALKPHOS 79 86 83   < > 95 117  --  102  --   BILITOT 0.4 0.4 0.5   < > 0.5 0.5  --  0.9  --    < > = values in this interval not displayed.    Studies:  DG Chest 2 View  Result Date: 11/11/2020 CLINICAL DATA:  75 year old female with cough. Chronic lymphocytic leukemia. EXAM: CHEST - 2 VIEW COMPARISON:  Chest radiograph dated 03/24/2018 FINDINGS: Port-A-Cath in similar position. Bilateral streaky and nodular densities most consistent with developing infiltrate, likely viral or atypical in etiology including COVID-19. Clinical correlation is recommended. The cardiac silhouette is within limits. Atherosclerotic calcification of the aorta. Right hilar mass/adenopathy measuring approximately 5 cm in craniocaudal length. No acute osseous pathology. Degenerative changes of the  spine. IMPRESSION: 1. Findings most consistent with developing infiltrate. 2. Right hilar mass/adenopathy in keeping with history of CLL. CT may provide better evaluation if clinically indicated. Electronically Signed   By: Anner Crete M.D.   On: 11/11/2020 20:52   CT Angio Chest PE W and/or Wo Contrast  Result Date: 12/03/2020 CLINICAL DATA:  Concern for pulmonary embolism. History of chronic lymphocytic leukemia. EXAM: CT ANGIOGRAPHY CHEST WITH CONTRAST TECHNIQUE: Multidetector CT imaging of the chest was performed using the standard protocol during bolus administration of intravenous contrast. Multiplanar CT image reconstructions and MIPs were obtained to evaluate the vascular anatomy. CONTRAST:  58mL OMNIPAQUE IOHEXOL 350 MG/ML SOLN COMPARISON:  CT 08/11/2020 FINDINGS: Cardiovascular: No filling defects within the pulmonary arteries to suggest acute pulmonary embolism. Mediastinum/Nodes: Bulky mediastinal perihilar lymphadenopathy again noted. For example lymph node along the RIGHT bronchus intermedius measuring 25 mm compares to 25 mm. RIGHT lower paratracheal node measuring 18 mm compares to 15 mm. Lungs/Pleura: There is patchy bilateral nodular airspace disease which is worsened from comparison exam. Consolidation and peripheral nodularity in the posterior RIGHT upper lobe on image 65/6 is worsened. There is a consolidation at the LEFT lung base measuring 3.4 cm (image 88/6) which is new. There is atelectasis in the RIGHT middle lobe slightly increased. There is a RIGHT pleural effusion which is new. W Ithin the lung apices there is patchy nodular airspace disease also worsened from comparison exam. Upper Abdomen: A periaortic retroperitoneal adenopathy similar to prior. Spleen appears enlarged. Musculoskeletal: No aggressive osseous lesion Review of the MIP images confirms the above findings. IMPRESSION: 1. No acute pulmonary embolism. 2. New bilateral patchy, nodular and consolidative airspace  disease most consistent multifocal pneumonia including atypical pneumonia. Secondary differential would include a pulmonary lymphoma, less favored. 3. Bulky mediastinal and axillary lymphadenopathy related to CLL. No change from prior. 4. Splenomegaly and upper abdominal adenopathy related to CLL. Electronically Signed   By: Suzy Bouchard M.D.   On: 12/03/2020 13:41   MM DIAG BREAST TOMO UNI LEFT  Result Date: 11/10/2020 CLINICAL DATA:  Callback from screening mammogram for possible asymmetry left breast EXAM: DIGITAL DIAGNOSTIC UNILATERAL LEFT MAMMOGRAM WITH TOMO AND CAD TECHNIQUE: Left digital diagnostic mammography and breast tomosynthesis was performed. Digital images of the breasts were evaluated with computer-aided detection. COMPARISON:  Prior films ACR Breast Density Category b: There are scattered areas of fibroglandular density. FINDINGS: Cc and MLO views of the left breast are submitted. Previously  questioned asymmetry in the medial left breast does not persist on additional views. No suspicious abnormality is identified. IMPRESSION: Benign findings. RECOMMENDATION: Routine screening mammogram back on schedule. I have discussed the findings and recommendations with the patient. If applicable, a reminder letter will be sent to the patient regarding the next appointment. BI-RADS CATEGORY  2: Benign. Electronically Signed   By: Abelardo Diesel M.D.   On: 11/10/2020 10:27

## 2020-12-05 NOTE — Progress Notes (Signed)
PROGRESS NOTE    Erin Moore  CHY:[id number] DOB: 01/16/1946 DOA: 12/03/2020 PCP: Jonathon Jordan, MD    Chief Complaint  Patient presents with  . Shortness of Breath    Brief Narrative: Patient pleasant 75 year old female, history of CLL, hypertension, anxiety, mental disability/learning disorder being followed at the cancer center who presented to the cancer center with acute respiratory failure with hypoxia with sats in the 80s noted to be short of breath on minimal exertion.  Patient prior to this had been treated with oral steroids and antibiotics for symptoms of cough which had improved and subsequently returned after course of treatment was done.  Patient placed on 4 L nasal cannula.  CT angio chest which was done was negative for PE however consistent with bilateral multifocal pneumonia.  Patient admitted placed empirically on IV antibiotics.  Oncology following.   Assessment & Plan:   Principal Problem:   Acute respiratory failure with hypoxia (HCC) Active Problems:   Multifocal pneumonia   CLL (chronic lymphocytic leukemia) (HCC)   Thrombocytopenia, unspecified (HCC)   Pancytopenia, acquired (HCC)   CKD (chronic kidney disease), symptom management only, stage 3 (moderate) (HCC)   Bilateral interstitial pneumonia (HCC)   Pneumonia   Hyperkalemia  1 acute hypoxic respiratory failure secondary to multifocal pneumonia Patient presented with acute hypoxic respiratory failure.  Patient noted to have had a cough a few weeks ago treated with cefdinir and prednisone with some intermittent improvement with symptoms however symptoms returned with dyspnea on minimal exertion after course of treatment was done.  CT angiogram chest done negative for PE however consistent with multifocal pneumonia. -Patient states feeling better than on admission. -Patient pancultured.  Sputum Gram stain and culture pending. -MRSA PCR negative.  SARS coronavirus 2 PCR negative.  Influenza A and B PCR  negative.  Urine strep pneumococcus antigen negative. -Slowly improving clinically.  Still hypoxic.  Still with a cough. -Continue empiric IV cefepime. -Vancomycin discontinued. -Continue Duonebs, Claritin, PPI, Dulera, prednisone, Mucinex.  Supportive care. -Patient with sats of 86 to 88% on room air with ambulation.   -Repeat ambulatory sats tomorrow.  Follow.   2.  CLL Elevated leukocytosis secondary to CLL and treatment per oncology.  Patient treatment currently on hold.  Patient on IV antibiotics. -Per oncology.  3.  Acquired pancytopenia/thrombocytopenia/normocytic anemia Likely secondary to chemo treatment.  Patient with no overt bleeding.  Hemoglobin currently at 8.7, platelet count of 117.  Transfusion threshold hemoglobin < 7.  Per oncology.  4.  Chronic kidney disease stage IIIb Currently stable.  Follow.  5.  Hyperkalemia Resolved.  Potassium of 4.1.  Continue to hold ARB.  Follow.   6.  Hypertension Continue Norvasc, Zebeta, HCTZ. Continue to hold Cozaar.    DVT prophylaxis: Lovenox Code Status: Full Family Communication: Updated patient.  No family at bedside.  Oncology stated will update family. Disposition:   Status is: Inpatient    Dispo: The patient is from: Group home              Anticipated d/c is to: Likely back to group home              Anticipated d/c date is: 2-3 days              Patient currently on IV antibiotics, with hypoxia, being treated for multifocal pneumonia.  Not stable for discharge.   Difficult to place patient no       Consultants:   Oncology: Dr. Alvy Bimler  Procedures:  CT angiogram chest 12/03/2020    Antimicrobials:  IV cefepime 12/03/2020>>>>  IV vancomycin 12/03/2020>>>> 12/04/2020   Subjective: Sitting up in chair about to eat lunch. Still with cough. Feels overall slowly improving. Still hypoxic on room air.   Objective: Vitals:   12/05/20 0557 12/05/20 0735 12/05/20 0741 12/05/20 0938  BP: 122/63   (!)  116/51  Pulse: (!) 56   70  Resp: 16   20  Temp: 98.6 F (37 C)   97.7 F (36.5 C)  TempSrc: Oral   Oral  SpO2: 94% (!) 89% (!) 89% 93%  Weight:      Height:        Intake/Output Summary (Last 24 hours) at 12/05/2020 1150 Last data filed at 12/05/2020 0932 Gross per 24 hour  Intake 740 ml  Output 550 ml  Net 190 ml   Filed Weights   12/03/20 2013  Weight: 103.4 kg    Examination:  General exam: NAD Respiratory system: Decreasing diffuse coarse breath sounds.  No wheezing.  Fair air movement.  Speaking in full sentences.   Cardiovascular system: Regular rate and rhythm no murmurs rubs or gallops.  No JVD.  No lower extremity edema.  Gastrointestinal system: Abdomen is soft, nontender, nondistended, positive bowel sounds.  No rebound.  No guarding.  Central nervous system: Alert and oriented. No focal neurological deficits. Extremities: Symmetric 5 x 5 power. Skin: No rashes, lesions or ulcers Psychiatry: Judgement and insight appear normal. Mood & affect appropriate.     Data Reviewed: I have personally reviewed following labs and imaging studies  CBC: Recent Labs  Lab 12/03/20 0950 12/03/20 2034 12/04/20 0313 12/05/20 0310  WBC 155.4* 108.5* 123.8* 136.8*  NEUTROABS 7.8*  --   --  13.7*  HGB 10.3* 9.7* 9.8* 8.7*  HCT 35.5* 33.4* 33.9* 31.0*  MCV 95.2 97.1 97.7 97.8  PLT 133* 118* 117* 126*    Basic Metabolic Panel: Recent Labs  Lab 12/03/20 0950 12/03/20 2034 12/04/20 0313 12/05/20 0310  NA 142  --  144 142  K 4.3  --  5.2* 4.1  CL 106  --  108 106  CO2 28  --  28 27  GLUCOSE 98  --  137* 127*  BUN 22  --  25* 32*  CREATININE 1.55* 1.78* 1.39* 1.11*  CALCIUM 9.1  --  9.1 8.7*  MG  --   --   --  2.0    GFR: Estimated Creatinine Clearance: 50.1 mL/min (A) (by C-G formula based on SCr of 1.11 mg/dL (H)).  Liver Function Tests: Recent Labs  Lab 12/03/20 0950 12/04/20 0313  AST 12* 13*  ALT 14 16  ALKPHOS 117 102  BILITOT 0.5 0.9  PROT 5.9*  5.9*  ALBUMIN 3.6 3.8    CBG: No results for input(s): GLUCAP in the last 168 hours.   Recent Results (from the past 240 hour(s))  Blood Culture (routine x 2)     Status: None (Preliminary result)   Collection Time: 12/03/20 11:02 AM   Specimen: BLOOD  Result Value Ref Range Status   Specimen Description   Final    BLOOD SITE NOT SPECIFIED Performed at [facility], [address], [place], [place] [address]    Special Requests   Final    BOTTLES DRAWN AEROBIC AND ANAEROBIC Blood Culture adequate volume Performed at [place] [facility] [address] [place], [place] [address]    Culture   Final    NO GROWTH <  24 HOURS Performed at [facility], [place] [address], [place], [place] [address]    Report Status PENDING  Incomplete  Blood Culture (routine x 2)     Status: None (Preliminary result)   Collection Time: 12/03/20 11:33 AM   Specimen: BLOOD  Result Value Ref Range Status   Specimen Description   Final    BLOOD RIGHT ANTECUBITAL Performed at [facility] [address], [place], [place] [address]    Special Requests   Final    BOTTLES DRAWN AEROBIC AND ANAEROBIC Blood Culture adequate volume Performed at [place] [address], [place], [place] [address]    Culture   Final    NO GROWTH < 24 HOURS Performed at [place] [address] [place], [place] [address]    Report Status PENDING  Incomplete  Urine culture     Status: None   Collection Time: 12/03/20 12:34 PM   Specimen: In/Out Cath Urine  Result Value Ref Range Status   Specimen Description   Final    IN/OUT CATH URINE Performed at [facility] [address], [place], [place] [address]    Special Requests   Final    NONE Performed at [facility], [place] [address] [place], [place] [address]    Culture   Final    NO GROWTH Performed at [facility], [place] [address]  [place], [place] [address]    Report Status 12/04/2020 FINAL  Final  Resp Panel by RT-PCR (Flu A&B, Covid) Nasopharyngeal Swab     Status: None   Collection Time: 12/03/20  3:20 PM   Specimen: Nasopharyngeal Swab; Nasopharyngeal(NP) swabs in vial transport medium  Result Value Ref Range Status   SARS Coronavirus 2 by RT PCR NEGATIVE NEGATIVE Final    Comment: (NOTE) SARS-CoV-2 target nucleic acids are NOT DETECTED.  The SARS-CoV-2 RNA is generally detectable in upper respiratory specimens during the acute phase of infection. The lowest concentration of SARS-CoV-2 viral copies this assay can detect is 138 copies/mL. A negative result does not preclude SARS-Cov-2 infection and should not be used as the sole basis for treatment or other patient management decisions. A negative result may occur with  improper specimen collection/handling, submission of specimen other than nasopharyngeal swab, presence of viral mutation(s) within the areas targeted by this assay, and inadequate number of viral copies(<138 copies/mL). A negative result must be combined with clinical observations, patient history, and epidemiological information. The expected result is Negative.  Fact Sheet for Patients:  EntrepreneurPulse.com.au  Fact Sheet for Healthcare Providers:  IncredibleEmployment.be  This test is no t yet approved or cleared by the [place] FDA and  has been authorized for detection and/or diagnosis of SARS-CoV-2 by FDA under an Emergency Use Authorization (EUA). This EUA will remain  in effect (meaning this test can be used) for the duration of the COVID-19 declaration under Section 564(b)(1) of the Act, 21 U.S.C.section 360bbb-3(b)(1), unless the authorization is terminated  or revoked sooner.       Influenza A by PCR NEGATIVE NEGATIVE Final   Influenza B by PCR NEGATIVE NEGATIVE Final    Comment: (NOTE) The Xpert Xpress SARS-CoV-2/FLU/RSV plus assay is  intended as an aid in the diagnosis of influenza from Nasopharyngeal swab specimens and should not be used as a sole basis for treatment. Nasal washings and aspirates are unacceptable for Xpert Xpress SARS-CoV-2/FLU/RSV testing.  Fact Sheet for Patients: EntrepreneurPulse.com.au  Fact Sheet for Healthcare Providers: IncredibleEmployment.be  This test is not yet approved or cleared by the [place] FDA and has been authorized for detection and/or diagnosis of SARS-CoV-2 by FDA under an Emergency Use Authorization (EUA). This EUA will remain in effect (meaning this test can be used) for the duration of the COVID-19 declaration under Section 564(b)(1) of the Act, 21 U.S.C. section 360bbb-3(b)(1), unless the authorization is terminated or revoked.  Performed at [facility], [place] [address] [place], [place] [address]   MRSA PCR Screening     Status: None   Collection Time: 12/04/20 12:00 AM   Specimen: Nasopharyngeal  Result Value Ref Range Status   MRSA by PCR NEGATIVE NEGATIVE Final    Comment:        The GeneXpert MRSA Assay (FDA approved for NASAL specimens only), is one component of a comprehensive MRSA colonization surveillance program. It is not intended to diagnose MRSA infection nor to guide or monitor treatment for MRSA infections. Performed at [facility], [place] [address], [place], [place] [address]   Culture, sputum-assessment     Status: None   Collection Time: 12/04/20  5:20 AM   Specimen: Expectorated Sputum  Result Value Ref Range Status   Specimen Description EXPECTORATED SPUTUM  Final   Special Requests Immunocompromised  Final   Sputum evaluation   Final    THIS SPECIMEN IS ACCEPTABLE FOR SPUTUM CULTURE Performed at [facility], [place] [address] [place], [place] [address]    Report Status 12/04/2020 FINAL  Final  Culture, Respiratory w Gram Stain      Status: None (Preliminary result)   Collection Time: 12/04/20  5:20 AM  Result Value Ref Range Status   Specimen Description   Final    EXPECTORATED SPUTUM Performed at [facility] [address], [place], [place] [address]    Special Requests   Final    Immunocompromised Reflexed from [id number] Performed at [facility], [place] [address], [place], [place] [address]    Gram Stain   Final    FEW WBC PRESENT, PREDOMINANTLY PMN RARE GRAM POSITIVE COCCI RARE GRAM NEGATIVE RODS    Culture   Final    CULTURE REINCUBATED FOR BETTER GROWTH Performed at [facility], [place] [address] [place], [place] [address]    Report Status PENDING  Incomplete         Radiology Studies: CT Angio Chest PE W and/or Wo Contrast  Result Date: 12/03/2020 CLINICAL DATA:  Concern for pulmonary embolism. History of chronic lymphocytic leukemia. EXAM: CT ANGIOGRAPHY CHEST WITH CONTRAST TECHNIQUE: Multidetector CT imaging of the chest was performed using the standard protocol during bolus administration of intravenous contrast. Multiplanar CT image reconstructions and MIPs were obtained to evaluate the vascular anatomy. CONTRAST:  47mL OMNIPAQUE IOHEXOL 350 MG/ML SOLN COMPARISON:  CT 08/11/2020 FINDINGS: Cardiovascular: No filling defects within the pulmonary arteries to suggest acute pulmonary embolism. Mediastinum/Nodes: Bulky mediastinal perihilar lymphadenopathy again noted. For example lymph node along the RIGHT bronchus intermedius measuring 25 mm compares to 25 mm. RIGHT lower paratracheal node measuring 18 mm compares to 15 mm. Lungs/Pleura: There is patchy bilateral nodular airspace disease which is worsened from comparison exam. Consolidation and peripheral nodularity in the posterior RIGHT upper lobe on image 65/6 is worsened. There is a consolidation at the LEFT lung base measuring 3.4 cm (image 88/6) which is new. There is atelectasis in the RIGHT middle lobe  slightly increased. There  is a RIGHT pleural effusion which is new. W Ithin the lung apices there is patchy nodular airspace disease also worsened from comparison exam. Upper Abdomen: A periaortic retroperitoneal adenopathy similar to prior. Spleen appears enlarged. Musculoskeletal: No aggressive osseous lesion Review of the MIP images confirms the above findings. IMPRESSION: 1. No acute pulmonary embolism. 2. New bilateral patchy, nodular and consolidative airspace disease most consistent multifocal pneumonia including atypical pneumonia. Secondary differential would include a pulmonary lymphoma, less favored. 3. Bulky mediastinal and axillary lymphadenopathy related to CLL. No change from prior. 4. Splenomegaly and upper abdominal adenopathy related to CLL. Electronically Signed   By: Suzy Bouchard M.D.   On: 12/03/2020 13:41        Scheduled Meds: . acyclovir  400 mg Oral Daily  . allopurinol  300 mg Oral Daily  . amLODipine  10 mg Oral Daily  . azelastine  2 spray Each Nare Daily  . bisoprolol  10 mg Oral Daily  . Chlorhexidine Gluconate Cloth  6 each Topical Daily  . cholecalciferol  2,000 Units Oral Daily  . citalopram  20 mg Oral q morning  . enoxaparin (LOVENOX) injection  40 mg Subcutaneous Q24H  . gabapentin  300 mg Oral QHS  . guaiFENesin  1,200 mg Oral BID  . hydrochlorothiazide  25 mg Oral Daily  . ipratropium-albuterol  3 mL Nebulization TID  . loratadine  10 mg Oral QHS  . mometasone-formoterol  2 puff Inhalation BID  . multivitamin  1 tablet Oral BID  . pantoprazole  40 mg Oral Daily  . predniSONE  40 mg Oral Q breakfast   Continuous Infusions: . ceFEPime (MAXIPIME) IV 2 g (12/05/20 0510)     LOS: 2 days    Time spent: 35 minutes    Irine Seal, MD [facility]   To contact the attending provider between 7A-7P or the covering provider during after hours 7P-7A, please log into the web site [email or web address] and access using universal [place]  password for that web site. If you do not have the password, please call the hospital operator.  12/05/2020, 11:50 AM

## 2020-12-06 DIAGNOSIS — J9601 Acute respiratory failure with hypoxia: Secondary | ICD-10-CM | POA: Diagnosis not present

## 2020-12-06 DIAGNOSIS — N183 Chronic kidney disease, stage 3 unspecified: Secondary | ICD-10-CM | POA: Diagnosis not present

## 2020-12-06 DIAGNOSIS — J189 Pneumonia, unspecified organism: Secondary | ICD-10-CM | POA: Diagnosis not present

## 2020-12-06 DIAGNOSIS — J849 Interstitial pulmonary disease, unspecified: Secondary | ICD-10-CM | POA: Diagnosis not present

## 2020-12-06 LAB — CULTURE, RESPIRATORY W GRAM STAIN: Culture: NORMAL

## 2020-12-06 LAB — BASIC METABOLIC PANEL
Anion gap: 8 (ref 5–15)
BUN: 35 mg/dL — ABNORMAL HIGH (ref 8–23)
CO2: 27 mmol/L (ref 22–32)
Calcium: 8.9 mg/dL (ref 8.9–10.3)
Chloride: 104 mmol/L (ref 98–111)
Creatinine, Ser: 1.06 mg/dL — ABNORMAL HIGH (ref 0.44–1.00)
GFR, Estimated: 55 mL/min — ABNORMAL LOW (ref >60)
Glucose, Bld: 96 mg/dL (ref 70–99)
Potassium: 4 mmol/L (ref 3.5–5.1)
Sodium: 139 mmol/L (ref 135–145)

## 2020-12-06 LAB — CBC
HCT: 31.1 % — ABNORMAL LOW (ref 36.0–46.0)
Hemoglobin: 8.8 g/dL — ABNORMAL LOW (ref 12.0–15.0)
MCH: 28 pg (ref 26.0–34.0)
MCHC: 28.3 g/dL — ABNORMAL LOW (ref 30.0–36.0)
MCV: 99 fL (ref 80.0–100.0)
Platelets: 123 10*3/uL — ABNORMAL LOW (ref 150–400)
RBC: 3.14 MIL/uL — ABNORMAL LOW (ref 3.87–5.11)
RDW: 17.8 % — ABNORMAL HIGH (ref 11.5–15.5)
WBC: 134.2 10*3/uL (ref 4.0–10.5)
nRBC: 0 % (ref 0.0–0.2)

## 2020-12-06 LAB — MAGNESIUM: Magnesium: 2.1 mg/dL (ref 1.7–2.4)

## 2020-12-06 MED ORDER — FUROSEMIDE 10 MG/ML IJ SOLN
20.0000 mg | Freq: Once | INTRAMUSCULAR | Status: AC
  Administered 2020-12-06: 20 mg via INTRAVENOUS
  Filled 2020-12-06: qty 2

## 2020-12-06 NOTE — Progress Notes (Signed)
Pharmacy Antibiotic Note  Erin Moore is a 75 y.o. 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 Chest CTA on 2/16 came back negative for PE, but showed findings consistent with multifocal PNA.  Pharmacy has been consulted to dose vancomycin and cefepime for PNA.  Today, 12/06/2020 Day #4 abx - Afebrile - WBC elevated (CLL) - SCr improved 1.06, CrCl ~55 ml/min - Cx neg to date  Plan: - continue cefepime 2gm IV q12h - monitor renal function closely - f/u duration tx __________________________________  Temp (24hrs), Avg:97.6 F (36.4 C), Min:97.4 F (36.3 C), Max:97.8 F (36.6 C)  Recent Labs  Lab 12/03/20 0950 12/03/20 1101 12/03/20 2034 12/04/20 0313 12/05/20 0310 12/06/20 0400  WBC 155.4*  --  108.5* 123.8* 136.8* 134.2*  CREATININE 1.55*  --  1.78* 1.39* 1.11* 1.06*  LATICACIDVEN  --  0.6 0.6  --   --   --     Estimated Creatinine Clearance: 52 mL/min (A) (by C-G formula based on SCr of 1.06 mg/dL (H)).    Allergies  Allergen Reactions  . Azithromycin Hives    Hives and fever  . Ibuprofen Other (See Comments) and Swelling    Hives   2/16 doxy x1 2/16 CTX x1 2/16 vanc>>2/17 2/16 cefepime>>  2/16 Covid: neg; Influenza A/B: neg/neg 2/16 ucx: NGF 2/16 bcx x2: ngtd 2/16 MRSA PCR:  neg 2/17 sputum: few normal flora, no staph or PsA  Thank you for allowing pharmacy to be a part of this patient's care.  Peggyann Juba, PharmD, BCPS Pharmacy: [phone] 12/06/2020 12:25 PM

## 2020-12-06 NOTE — Evaluation (Signed)
Physical Therapy Evaluation Patient Details Name: Erin Moore MRN: [id number] DOB: 21-Jan-1946 Today's Date: 12/06/2020   History of Present Illness  75 year old female, history of CLL, HTN, anxiety, mental disability/learning disorder being followed at cancer center who presented to the cancer center with acute respiratory failure with hypoxia with sats in the 80s noted to be short of breath on minimal exertion  Clinical Impression  Patient presents with mobility close to baseline.  Only needs S for ambulation due to IV and checking sats.  Patient currently needs O2 for mobility.  Feel she is stable for d/c back to ALF to resume HHPT, but will still benefit from nursing to help ambulate in hallway as able.  No further skilled acute PT needs.     Follow Up Recommendations Home health PT (@ ALF, she says is already in progress)    Equipment Recommendations  None recommended by PT    Recommendations for Other Services       Precautions / Restrictions Precautions Precautions: Fall Precaution Comments: monitor sats      Mobility  Bed Mobility Overal bed mobility: Modified Independent             General bed mobility comments: with HOB elevated    Transfers Overall transfer level: Modified independent Equipment used: 4-wheeled walker                Ambulation/Gait Ambulation/Gait assistance: Supervision Gait Distance (Feet): 300 Feet (with 3 seated rest breaks on rollator) Assistive device: 4-wheeled walker Gait Pattern/deviations: Step-through pattern;Decreased stride length     General Gait Details: assist for lines, cues for self monitoring and resting for monitoring sats  Stairs            Wheelchair Mobility    Modified Rankin (Stroke Patients Only)       Balance Overall balance assessment: Mild deficits observed, not formally tested                                           Pertinent Vitals/Pain Pain Assessment:  No/denies pain    Home Living Family/patient expects to be discharged to:: Assisted living               Home Equipment: Walker - 4 wheels      Prior Function Level of Independence: Independent with assistive device(s)         Comments: ambulates with rollator, goes to dining room for meals; assist for meds and cleaning     Hand Dominance   Dominant Hand: Right    Extremity/Trunk Assessment   Upper Extremity Assessment Upper Extremity Assessment: Defer to OT evaluation    Lower Extremity Assessment Lower Extremity Assessment: Overall WFL for tasks assessed       Communication   Communication: No difficulties  Cognition Arousal/Alertness: Awake/alert Behavior During Therapy: WFL for tasks assessed/performed Overall Cognitive Status: Within Functional Limits for tasks assessed                                        General Comments General comments (skin integrity, edema, etc.): SpO2 on RA 89%; with mobility on RA (~50') 89%, but pt dyspneic so applied O2 @ 2LPM; ambulated x another 100' and stopped to check SpO2 and due to pt fatigue, 90%, but up to 95%  after resting and able to make it all the way back to room.    Exercises     Assessment/Plan    PT Assessment Patent does not need any further PT services  PT Problem List         PT Treatment Interventions      PT Goals (Current goals can be found in the Care Plan section)  Acute Rehab PT Goals Patient Stated Goal: back to ALF PT Goal Formulation: All assessment and education complete, DC therapy    Frequency     Barriers to discharge        Co-evaluation               AM-PAC PT "6 Clicks" Mobility  Outcome Measure Help needed turning from your back to your side while in a flat bed without using bedrails?: A Little Help needed moving from lying on your back to sitting on the side of a flat bed without using bedrails?: None Help needed moving to and from a bed to a  chair (including a wheelchair)?: None Help needed standing up from a chair using your arms (e.g., wheelchair or bedside chair)?: None Help needed to walk in hospital room?: None Help needed climbing 3-5 steps with a railing? : A Little 6 Click Score: 22    End of Session Equipment Utilized During Treatment: Oxygen Activity Tolerance: Patient tolerated treatment well Patient left: in bed;with call bell/phone within reach   PT Visit Diagnosis: Muscle weakness (generalized) (M62.81)    Time: 8469-6295 PT Time Calculation (min) (ACUTE ONLY): 24 min   Charges:   PT Evaluation $PT Eval Low Complexity: 1 Low PT Treatments $Gait Training: 8-22 mins        Magda Kiel, PT Acute Rehabilitation Services Pager:[phone] Office:[phone] 12/06/2020   Erin Moore 12/06/2020, 12:50 PM

## 2020-12-06 NOTE — Progress Notes (Signed)
PROGRESS NOTE    Erin Moore  EVO:[id number] DOB: 1946-08-22 DOA: 12/03/2020 PCP: Jonathon Jordan, MD    Chief Complaint  Patient presents with  . Shortness of Breath    Brief Narrative: Patient pleasant 75 year old female, history of CLL, hypertension, anxiety, mental disability/learning disorder being followed at the cancer center who presented to the cancer center with acute respiratory failure with hypoxia with sats in the 80s noted to be short of breath on minimal exertion.  Patient prior to this had been treated with oral steroids and antibiotics for symptoms of cough which had improved and subsequently returned after course of treatment was done.  Patient placed on 4 L nasal cannula.  CT angio chest which was done was negative for PE however consistent with bilateral multifocal pneumonia.  Patient admitted placed empirically on IV antibiotics.  Oncology following.   Assessment & Plan:   Principal Problem:   Acute respiratory failure with hypoxia (HCC) Active Problems:   Multifocal pneumonia   CLL (chronic lymphocytic leukemia) (HCC)   Thrombocytopenia, unspecified (HCC)   Pancytopenia, acquired (HCC)   CKD (chronic kidney disease), symptom management only, stage 3 (moderate) (HCC)   Bilateral interstitial pneumonia (HCC)   Pneumonia   Hyperkalemia  1 acute hypoxic respiratory failure secondary to multifocal pneumonia Patient presented with acute hypoxic respiratory failure.  Patient noted to have had a cough a few weeks ago treated with cefdinir and prednisone with some intermittent improvement with symptoms however symptoms returned with dyspnea on minimal exertion after course of treatment was done.  CT angiogram chest done negative for PE however consistent with multifocal pneumonia. -Patient slowly improving clinically. -Patient pancultured.  Sputum Gram stain and culture pending. -MRSA PCR negative.  SARS coronavirus 2 PCR negative.  Influenza A and B PCR negative.   Urine strep pneumococcus antigen negative. -Patient still hypoxic. Patient still with cough. -Continue empiric IV cefepime. -Vancomycin discontinued. -Continue Duonebs, Claritin, PPI, Dulera, prednisone, Mucinex.  Supportive care. -Patient with sats of 86 to 88% on room air with ambulation from 12/05/2020. -We will give Lasix 20 mg IV x1. Follow urine output. -Repeat ambulatory sats tomorrow.  Follow.   2.  CLL Elevated leukocytosis secondary to CLL and treatment per oncology.  Patient treatment currently on hold.  Patient on IV antibiotics. -Per oncology.  3.  Acquired pancytopenia/thrombocytopenia/normocytic anemia Likely secondary to chemo treatment.  Patient with no overt bleeding.  Hemoglobin 8.8. platelet count of 123. Transfusion threshold hemoglobin <7. Per oncology.  4.  Chronic kidney disease stage IIIb Currently stable.  Follow.  5.  Hyperkalemia Resolved. Potassium of 4.0. Continue to hold ARB. Follow.  6.  Hypertension Controlled on current regimen of Zebeta, Norvasc, HCTZ. Continue to hold Cozaar. We will give a dose of IV Lasix secondary to problem #1. Follow.    DVT prophylaxis: Lovenox Code Status: Full Family Communication: Updated patient.  No family at bedside.  Oncology stated will update family. Disposition:   Status is: Inpatient    Dispo: The patient is from: Group home              Anticipated d/c is to: Likely back to group home              Anticipated d/c date is: 1-2 days              Patient currently on IV antibiotics, with hypoxia, being treated for multifocal pneumonia.  Not stable for discharge.   Difficult to place patient no  Consultants:   Oncology: Dr. Alvy Bimler  Procedures:   CT angiogram chest 12/03/2020    Antimicrobials:  IV cefepime 12/03/2020>>>>  IV vancomycin 12/03/2020>>>> 12/04/2020   Subjective: Laying in bed.  Denies any chest pain.  States she feels shortness of breath is slowly improving.  States cough is  not as productive.   Asking when she is going to be able to go home.   Objective: Vitals:   12/05/20 1922 12/05/20 2103 12/06/20 0518 12/06/20 0751  BP:  (!) 145/69 113/60   Pulse:  70 (!) 55   Resp:  18 16   Temp:  97.8 F (36.6 C) (!) 97.4 F (36.3 C)   TempSrc:  Oral Oral   SpO2: 95% 91% 96% 93%  Weight:      Height:        Intake/Output Summary (Last 24 hours) at 12/06/2020 1128 Last data filed at 12/06/2020 1027 Gross per 24 hour  Intake 536 ml  Output 2050 ml  Net -1514 ml   Filed Weights   12/03/20 2013 12/05/20 1829  Weight: 103.4 kg 101.5 kg    Examination:  General exam: NAD. Respiratory system: Some decreased breath sounds in the bases.  Some scattered coarse breath sounds.  No wheezing.  Fair air movement.    Cardiovascular system: RRR no murmurs rubs or gallops.  No JVD.  No lower extremity edema.  Gastrointestinal system: Abdomen is soft, nontender, nondistended, positive bowel sounds.  No rebound.  No guarding.  Central nervous system: Alert and oriented.  No focal neurological deficits.   Extremities: Symmetric 5 x 5 power. Skin: No rashes, lesions or ulcers Psychiatry: Judgement and insight appear fair. Mood & affect appropriate.     Data Reviewed: I have personally reviewed following labs and imaging studies  CBC: Recent Labs  Lab 12/03/20 0950 12/03/20 2034 12/04/20 0313 12/05/20 0310 12/06/20 0400  WBC 155.4* 108.5* 123.8* 136.8* 134.2*  NEUTROABS 7.8*  --   --  13.7*  --   HGB 10.3* 9.7* 9.8* 8.7* 8.8*  HCT 35.5* 33.4* 33.9* 31.0* 31.1*  MCV 95.2 97.1 97.7 97.8 99.0  PLT 133* 118* 117* 126* 123*    Basic Metabolic Panel: Recent Labs  Lab 12/03/20 0950 12/03/20 2034 12/04/20 0313 12/05/20 0310 12/06/20 0400  NA 142  --  144 142 139  K 4.3  --  5.2* 4.1 4.0  CL 106  --  108 106 104  CO2 28  --  28 27 27   GLUCOSE 98  --  137* 127* 96  BUN 22  --  25* 32* 35*  CREATININE 1.55* 1.78* 1.39* 1.11* 1.06*  CALCIUM 9.1  --  9.1 8.7*  8.9  MG  --   --   --  2.0 2.1    GFR: Estimated Creatinine Clearance: 52 mL/min (A) (by C-G formula based on SCr of 1.06 mg/dL (H)).  Liver Function Tests: Recent Labs  Lab 12/03/20 0950 12/04/20 0313  AST 12* 13*  ALT 14 16  ALKPHOS 117 102  BILITOT 0.5 0.9  PROT 5.9* 5.9*  ALBUMIN 3.6 3.8    CBG: No results for input(s): GLUCAP in the last 168 hours.   Recent Results (from the past 240 hour(s))  Blood Culture (routine x 2)     Status: None (Preliminary result)   Collection Time: 12/03/20 11:02 AM   Specimen: BLOOD  Result Value Ref Range Status   Specimen Description   Final    BLOOD SITE NOT SPECIFIED Performed at  [facility], [place] [address] [place], [place] [address]    Special Requests   Final    BOTTLES DRAWN AEROBIC AND ANAEROBIC Blood Culture adequate volume Performed at [facility] [address], [place], [place] [address]    Culture   Final    NO GROWTH 3 DAYS Performed at [facility], [place] [address], [place], [place] [address]    Report Status PENDING  Incomplete  Blood Culture (routine x 2)     Status: None (Preliminary result)   Collection Time: 12/03/20 11:33 AM   Specimen: BLOOD  Result Value Ref Range Status   Specimen Description   Final    BLOOD RIGHT ANTECUBITAL Performed at [facility] [address] [place], [place] [address]    Special Requests   Final    BOTTLES DRAWN AEROBIC AND ANAEROBIC Blood Culture adequate volume Performed at [facility] [address] [place], [place] [address]    Culture   Final    NO GROWTH 3 DAYS Performed at [facility], [place] [address], [place], [place] [address]    Report Status PENDING  Incomplete  Urine culture     Status: None   Collection Time: 12/03/20 12:34 PM   Specimen: In/Out Cath Urine  Result Value Ref Range Status   Specimen Description   Final    IN/OUT CATH URINE Performed at [facility] [address] [place], [place] [address]    Special Requests   Final    NONE Performed at [facility], [place] [address] [place], [place] [address]    Culture   Final    NO GROWTH Performed at [facility], [place] [address] [place], [place] [address]    Report Status 12/04/2020 FINAL  Final  Resp Panel by RT-PCR (Flu A&B, Covid) Nasopharyngeal Swab     Status: None   Collection Time: 12/03/20  3:20 PM   Specimen: Nasopharyngeal Swab; Nasopharyngeal(NP) swabs in vial transport medium  Result Value Ref Range Status   SARS Coronavirus 2 by RT PCR NEGATIVE NEGATIVE Final    Comment: (NOTE) SARS-CoV-2 target nucleic acids are NOT DETECTED.  The SARS-CoV-2 RNA is generally detectable in upper respiratory specimens during the acute phase of infection. The lowest concentration of SARS-CoV-2 viral copies this assay can detect is 138 copies/mL. A negative result does not preclude SARS-Cov-2 infection and should not be used as the sole basis for treatment or other patient management decisions. A negative result may occur with  improper specimen collection/handling, submission of specimen other than nasopharyngeal swab, presence of viral mutation(s) within the areas targeted by this assay, and inadequate number of viral copies(<138 copies/mL). A negative result must be combined with clinical observations, patient history, and epidemiological information. The expected result is Negative.  Fact Sheet for Patients:  EntrepreneurPulse.com.au  Fact Sheet for Healthcare Providers:  IncredibleEmployment.be  This test is no t yet approved or cleared by the [place] FDA and  has been authorized for detection and/or diagnosis of SARS-CoV-2 by FDA under an Emergency Use Authorization (EUA). This EUA will remain  in effect (meaning this test can be used) for the duration of the COVID-19 declaration under Section 564(b)(1)  of the Act, 21 U.S.C.section 360bbb-3(b)(1), unless the authorization is terminated  or revoked sooner.       Influenza A by PCR NEGATIVE NEGATIVE Final   Influenza B by PCR NEGATIVE NEGATIVE  Final    Comment: (NOTE) The Xpert Xpress SARS-CoV-2/FLU/RSV plus assay is intended as an aid in the diagnosis of influenza from Nasopharyngeal swab specimens and should not be used as a sole basis for treatment. Nasal washings and aspirates are unacceptable for Xpert Xpress SARS-CoV-2/FLU/RSV testing.  Fact Sheet for Patients: EntrepreneurPulse.com.au  Fact Sheet for Healthcare Providers: IncredibleEmployment.be  This test is not yet approved or cleared by the [place] FDA and has been authorized for detection and/or diagnosis of SARS-CoV-2 by FDA under an Emergency Use Authorization (EUA). This EUA will remain in effect (meaning this test can be used) for the duration of the COVID-19 declaration under Section 564(b)(1) of the Act, 21 U.S.C. section 360bbb-3(b)(1), unless the authorization is terminated or revoked.  Performed at [facility], [place] [address], [place], [place] [address]   MRSA PCR Screening     Status: None   Collection Time: 12/04/20 12:00 AM   Specimen: Nasopharyngeal  Result Value Ref Range Status   MRSA by PCR NEGATIVE NEGATIVE Final    Comment:        The GeneXpert MRSA Assay (FDA approved for NASAL specimens only), is one component of a comprehensive MRSA colonization surveillance program. It is not intended to diagnose MRSA infection nor to guide or monitor treatment for MRSA infections. Performed at [facility], [place] [address] [place], [place] [address]   Culture, sputum-assessment     Status: None   Collection Time: 12/04/20  5:20 AM   Specimen: Expectorated Sputum  Result Value Ref Range Status   Specimen Description EXPECTORATED SPUTUM  Final   Special Requests  Immunocompromised  Final   Sputum evaluation   Final    THIS SPECIMEN IS ACCEPTABLE FOR SPUTUM CULTURE Performed at [facility], [place] [address] [place], [place] [address]    Report Status 12/04/2020 FINAL  Final  Culture, Respiratory w Gram Stain     Status: None   Collection Time: 12/04/20  5:20 AM  Result Value Ref Range Status   Specimen Description   Final    EXPECTORATED SPUTUM Performed at [facility] [address], [place], [place] [address]    Special Requests   Final    Immunocompromised Reflexed from [id number] Performed at [facility], [place] [address], [place], [place] [address]    Gram Stain   Final    FEW WBC PRESENT, PREDOMINANTLY PMN RARE GRAM POSITIVE COCCI RARE GRAM NEGATIVE RODS    Culture   Final    FEW Normal respiratory flora-no Staph aureus or Pseudomonas seen Performed at [facility], [place] [address] [place], [place] [address]    Report Status 12/06/2020 FINAL  Final         Radiology Studies: No results found.      Scheduled Meds: . acyclovir  400 mg Oral Daily  . allopurinol  300 mg Oral Daily  . amLODipine  10 mg Oral Daily  . azelastine  2 spray Each Nare Daily  . bisoprolol  10 mg Oral Daily  . Chlorhexidine Gluconate Cloth  6 each Topical Daily  . cholecalciferol  2,000 Units Oral Daily  . citalopram  20 mg Oral q morning  . enoxaparin (LOVENOX) injection  40 mg Subcutaneous Q24H  . gabapentin  300 mg Oral QHS  . guaiFENesin  1,200 mg Oral BID  . hydrochlorothiazide  25 mg Oral Daily  . ipratropium-albuterol  3 mL Nebulization TID  . loratadine  10 mg Oral QHS  . mometasone-formoterol  2 puff Inhalation BID  . multivitamin  1 tablet Oral BID  . pantoprazole  40 mg Oral Daily  . predniSONE  40 mg Oral Q breakfast   Continuous Infusions: . ceFEPime (MAXIPIME) IV 2 g (12/06/20 0548)     LOS: 3 days    Time spent: 35 minutes    Irine Seal, MD [facility]   To contact the attending provider between 7A-7P or the covering provider during after hours 7P-7A, please log into the web site [email or web address] and access using universal [place] password for that web site. If you do not have the password, please call the hospital operator.  12/06/2020, 11:28 AM

## 2020-12-07 ENCOUNTER — Inpatient Hospital Stay (HOSPITAL_COMMUNITY): Payer: Medicare Other

## 2020-12-07 DIAGNOSIS — J9601 Acute respiratory failure with hypoxia: Secondary | ICD-10-CM | POA: Diagnosis not present

## 2020-12-07 DIAGNOSIS — R0902 Hypoxemia: Secondary | ICD-10-CM

## 2020-12-07 DIAGNOSIS — E8770 Fluid overload, unspecified: Secondary | ICD-10-CM

## 2020-12-07 DIAGNOSIS — J849 Interstitial pulmonary disease, unspecified: Secondary | ICD-10-CM | POA: Diagnosis not present

## 2020-12-07 DIAGNOSIS — N183 Chronic kidney disease, stage 3 unspecified: Secondary | ICD-10-CM | POA: Diagnosis not present

## 2020-12-07 DIAGNOSIS — J189 Pneumonia, unspecified organism: Secondary | ICD-10-CM | POA: Diagnosis not present

## 2020-12-07 LAB — BASIC METABOLIC PANEL
Anion gap: 8 (ref 5–15)
BUN: 36 mg/dL — ABNORMAL HIGH (ref 8–23)
CO2: 28 mmol/L (ref 22–32)
Calcium: 8.8 mg/dL — ABNORMAL LOW (ref 8.9–10.3)
Chloride: 102 mmol/L (ref 98–111)
Creatinine, Ser: 0.97 mg/dL (ref 0.44–1.00)
GFR, Estimated: >60 mL/min (ref >60)
Glucose, Bld: 107 mg/dL — ABNORMAL HIGH (ref 70–99)
Potassium: 4.5 mmol/L (ref 3.5–5.1)
Sodium: 138 mmol/L (ref 135–145)

## 2020-12-07 LAB — CBC WITH DIFFERENTIAL/PLATELET
Abs Immature Granulocytes: 0.57 10*3/uL — ABNORMAL HIGH (ref 0.00–0.07)
Basophils Absolute: 0.2 10*3/uL — ABNORMAL HIGH (ref 0.0–0.1)
Basophils Relative: 0 %
Eosinophils Absolute: 0.1 10*3/uL (ref 0.0–0.5)
Eosinophils Relative: 0 %
HCT: 32.2 % — ABNORMAL LOW (ref 36.0–46.0)
Hemoglobin: 9.2 g/dL — ABNORMAL LOW (ref 12.0–15.0)
Immature Granulocytes: 0 %
Lymphocytes Relative: 88 %
Lymphs Abs: 127 10*3/uL — ABNORMAL HIGH (ref 0.7–4.0)
MCH: 27.9 pg (ref 26.0–34.0)
MCHC: 28.6 g/dL — ABNORMAL LOW (ref 30.0–36.0)
MCV: 97.6 fL (ref 80.0–100.0)
Monocytes Absolute: 10.7 10*3/uL — ABNORMAL HIGH (ref 0.1–1.0)
Monocytes Relative: 7 %
Neutro Abs: 6.5 10*3/uL (ref 1.7–7.7)
Neutrophils Relative %: 5 %
Platelets: 125 10*3/uL — ABNORMAL LOW (ref 150–400)
RBC: 3.3 MIL/uL — ABNORMAL LOW (ref 3.87–5.11)
RDW: 17.5 % — ABNORMAL HIGH (ref 11.5–15.5)
WBC: 145.1 10*3/uL (ref 4.0–10.5)
nRBC: 0 % (ref 0.0–0.2)

## 2020-12-07 LAB — MAGNESIUM: Magnesium: 2.1 mg/dL (ref 1.7–2.4)

## 2020-12-07 LAB — BRAIN NATRIURETIC PEPTIDE: B Natriuretic Peptide: 119.9 pg/mL — ABNORMAL HIGH (ref 0.0–100.0)

## 2020-12-07 MED ORDER — FUROSEMIDE 10 MG/ML IJ SOLN
20.0000 mg | Freq: Two times a day (BID) | INTRAMUSCULAR | Status: AC
  Administered 2020-12-07 – 2020-12-08 (×2): 20 mg via INTRAVENOUS
  Filled 2020-12-07 (×2): qty 2

## 2020-12-07 NOTE — Progress Notes (Signed)
SATURATION QUALIFICATIONS: (This note is used to comply with regulatory documentation for home oxygen)  Patient Saturations on Room Air at Rest = 88%  Patient Saturations on Room Air while Ambulating = 81%  Patient Saturations on 2 Liters of oxygen while Ambulating = 93%  Please briefly explain why patient needs home oxygen: Patients oxygen saturations drops with ambulation.

## 2020-12-07 NOTE — Progress Notes (Addendum)
PROGRESS NOTE    Erin Moore  MWU:[id number] DOB: 1946/06/06 DOA: 12/03/2020 PCP: Jonathon Jordan, MD    Chief Complaint  Patient presents with  . Shortness of Breath    Brief Narrative: Patient pleasant 75 year old female, history of CLL, hypertension, anxiety, mental disability/learning disorder being followed at the cancer center who presented to the cancer center with acute respiratory failure with hypoxia with sats in the 80s noted to be short of breath on minimal exertion.  Patient prior to this had been treated with oral steroids and antibiotics for symptoms of cough which had improved and subsequently returned after course of treatment was done.  Patient placed on 4 L nasal cannula.  CT angio chest which was done was negative for PE however consistent with bilateral multifocal pneumonia.  Patient admitted placed empirically on IV antibiotics.  Oncology following.   Assessment & Plan:   Principal Problem:   Acute respiratory failure with hypoxia (HCC) Active Problems:   Multifocal pneumonia   CLL (chronic lymphocytic leukemia) (HCC)   Thrombocytopenia, unspecified (HCC)   Pancytopenia, acquired (HCC)   CKD (chronic kidney disease), symptom management only, stage 3 (moderate) (HCC)   Bilateral interstitial pneumonia (HCC)   Pneumonia   Hyperkalemia  1 acute hypoxic respiratory failure secondary to multifocal pneumonia vs Volume overload Patient presented with acute hypoxic respiratory failure.??  Etiology patient noted to have had a cough a few weeks ago treated with cefdinir and prednisone with some intermittent improvement with symptoms however symptoms returned with dyspnea on minimal exertion after course of treatment was done.  CT angiogram chest done negative for PE however consistent with multifocal pneumonia. -Patient slowly improving clinically.  Patient however still hypoxic. -Patient pancultured.  Sputum Gram stain and culture with few normal respiratory flora, no  staph aureus or Pseudomonas seen.  -MRSA PCR negative.  SARS coronavirus 2 PCR negative.  Influenza A and B PCR negative.   -Urine strep pneumococcus antigen negative. -Patient still hypoxic.(Ambulatory sats of 81% on room air with ambulation, 88% on room air at rest, 93% on 2 L nasal cannula with ambulation). patient still with cough. -Patient given a dose of Lasix 20 mg IV x1 (12/06/2020) with urine output of 5.550 L over the past 24 hours.  Patient is -6.6 L during this hospitalization. -Concern for component of volume overload. -Check a BNP, EKG, 2D echo -We will place on Lasix 20 mg IV every 12 hours x2 doses.  Monitor urine output, daily weights. -Continue empiric IV cefepime. -Vancomycin discontinued. -Continue Duonebs, Claritin, PPI, Dulera, prednisone, Mucinex.  Supportive care. = Repeat ambulatory sats tomorrow.   2.  CLL Elevated leukocytosis secondary to CLL and treatment per oncology.  Treatment currently on hold.  Patient on IV antibiotics.  Per oncology.   3.  Acquired pancytopenia/thrombocytopenia/normocytic anemia Likely secondary to chemo treatment.  Patient with no overt bleeding.  Hemoglobin currently at 9.2.  Platelet count at 125.  Transfusion threshold hemoglobin <7.  Per oncology.    4.  Chronic kidney disease stage IIIb Currently stable.  Follow.  5.  Hyperkalemia Resolved.  Potassium at 4.5.  Continue to hold ARB.  Follow.    6.  Hypertension Stable on current regimen of Zebeta, Norvasc, HCTZ.  Controlled on current regimen of Zebeta, Norvasc, HCTZ.  We will give Lasix 20 mg IV every 12 hours x2 doses.  Holding ARB.  Follow.    DVT prophylaxis: Lovenox Code Status: Full Family Communication: Updated patient.  Tried to call sister Tawana Scale  on the telephone however went straight to voicemail.   Disposition:   Status is: Inpatient    Dispo: The patient is from: Group home              Anticipated d/c is to: Likely back to group home               Anticipated d/c date is: 1-2 days              Patient currently on IV antibiotics, with hypoxia, being treated for multifocal pneumonia.  Not stable for discharge.   Difficult to place patient no       Consultants:   Oncology: Dr. Alvy Bimler  Procedures:   CT angiogram chest 12/03/2020    Antimicrobials:  IV cefepime 12/03/2020>>>>  IV vancomycin 12/03/2020>>>> 12/04/2020   Subjective: Sitting up in chair.  Feels shortness of breath is improving.  Cough improving.  Asking when she is going to be able to go home.  Still hypoxic on room air.    Objective: Vitals:   12/06/20 2129 12/07/20 0529 12/07/20 0753 12/07/20 0947  BP: 129/64 134/68  131/64  Pulse: 69 63  69  Resp: 18 20  16   Temp: 97.6 F (36.4 C) (!) 97.4 F (36.3 C)  97.8 F (36.6 C)  TempSrc: Oral Oral  Oral  SpO2: 93% 94% 91% 94%  Weight:      Height:        Intake/Output Summary (Last 24 hours) at 12/07/2020 1156 Last data filed at 12/07/2020 1023 Gross per 24 hour  Intake 1160 ml  Output 4900 ml  Net -3740 ml   Filed Weights   12/03/20 2013 12/05/20 1829  Weight: 103.4 kg 101.5 kg    Examination:  General exam: NAD. Respiratory system: Decreased breath sounds in the bases.  Scattered coarse breath sounds/crackles.  No wheezing.  Fair air movement.  Speaking in full sentences. Cardiovascular system: Regular rate rhythm no murmurs rubs or gallops.  No JVD.  No lower extremity edema. Gastrointestinal system: Abdomen is soft, nontender, nondistended, positive bowel sounds.  No rebound.  No guarding.   Central nervous system: Alert and oriented.  No focal neurological deficits.   Extremities: Symmetric 5 x 5 power. Skin: No rashes, lesions or ulcers Psychiatry: Judgement and insight appear fair. Mood & affect appropriate.     Data Reviewed: I have personally reviewed following labs and imaging studies  CBC: Recent Labs  Lab 12/03/20 0950 12/03/20 2034 12/04/20 0313 12/05/20 0310  12/06/20 0400 12/07/20 0306  WBC 155.4* 108.5* 123.8* 136.8* 134.2* 145.1*  NEUTROABS 7.8*  --   --  13.7*  --  6.5  HGB 10.3* 9.7* 9.8* 8.7* 8.8* 9.2*  HCT 35.5* 33.4* 33.9* 31.0* 31.1* 32.2*  MCV 95.2 97.1 97.7 97.8 99.0 97.6  PLT 133* 118* 117* 126* 123* 125*    Basic Metabolic Panel: Recent Labs  Lab 12/03/20 0950 12/03/20 2034 12/04/20 0313 12/05/20 0310 12/06/20 0400 12/07/20 0306  NA 142  --  144 142 139 138  K 4.3  --  5.2* 4.1 4.0 4.5  CL 106  --  108 106 104 102  CO2 28  --  28 27 27 28   GLUCOSE 98  --  137* 127* 96 107*  BUN 22  --  25* 32* 35* 36*  CREATININE 1.55* 1.78* 1.39* 1.11* 1.06* 0.97  CALCIUM 9.1  --  9.1 8.7* 8.9 8.8*  MG  --   --   --  2.0 2.1  2.1    GFR: Estimated Creatinine Clearance: 56.8 mL/min (by C-G formula based on SCr of 0.97 mg/dL).  Liver Function Tests: Recent Labs  Lab 12/03/20 0950 12/04/20 0313  AST 12* 13*  ALT 14 16  ALKPHOS 117 102  BILITOT 0.5 0.9  PROT 5.9* 5.9*  ALBUMIN 3.6 3.8    CBG: No results for input(s): GLUCAP in the last 168 hours.   Recent Results (from the past 240 hour(s))  Blood Culture (routine x 2)     Status: None (Preliminary result)   Collection Time: 12/03/20 11:02 AM   Specimen: BLOOD  Result Value Ref Range Status   Specimen Description   Final    BLOOD SITE NOT SPECIFIED Performed at [facility], [address] [place], [place] [address]    Special Requests   Final    BOTTLES DRAWN AEROBIC AND ANAEROBIC Blood Culture adequate volume Performed at [facility] [address] [place], [place] [address]    Culture   Final    NO GROWTH 4 DAYS Performed at [facility], [place] [address] [place], [place] [address]    Report Status PENDING  Incomplete  Blood Culture (routine x 2)     Status: None (Preliminary result)   Collection Time: 12/03/20 11:33 AM   Specimen: BLOOD  Result Value Ref Range Status   Specimen Description   Final    BLOOD RIGHT  ANTECUBITAL Performed at [facility] [address] [place], [place] [address]    Special Requests   Final    BOTTLES DRAWN AEROBIC AND ANAEROBIC Blood Culture adequate volume Performed at [place] [facility] [address], [place], [place] [address]    Culture   Final    NO GROWTH 4 DAYS Performed at [facility], [place] [address] [place], [place] [address]    Report Status PENDING  Incomplete  Urine culture     Status: None   Collection Time: 12/03/20 12:34 PM   Specimen: In/Out Cath Urine  Result Value Ref Range Status   Specimen Description   Final    IN/OUT CATH URINE Performed at [facility] [address] [place], [place] [address]    Special Requests   Final    NONE Performed at [facility], [place] [address], [place], [place] [address]    Culture   Final    NO GROWTH Performed at [facility], [place] [address], [place], [place] [address]    Report Status 12/04/2020 FINAL  Final  Resp Panel by RT-PCR (Flu A&B, Covid) Nasopharyngeal Swab     Status: None   Collection Time: 12/03/20  3:20 PM   Specimen: Nasopharyngeal Swab; Nasopharyngeal(NP) swabs in vial transport medium  Result Value Ref Range Status   SARS Coronavirus 2 by RT PCR NEGATIVE NEGATIVE Final    Comment: (NOTE) SARS-CoV-2 target nucleic acids are NOT DETECTED.  The SARS-CoV-2 RNA is generally detectable in upper respiratory specimens during the acute phase of infection. The lowest concentration of SARS-CoV-2 viral copies this assay can detect is 138 copies/mL. A negative result does not preclude SARS-Cov-2 infection and should not be used as the sole basis for treatment or other patient management decisions. A negative result may occur with  improper specimen collection/handling, submission of specimen other than nasopharyngeal swab, presence of viral mutation(s) within the areas targeted by this assay, and inadequate  number of viral copies(<138 copies/mL). A negative  result must be combined with clinical observations, patient history, and epidemiological information. The expected result is Negative.  Fact Sheet for Patients:  EntrepreneurPulse.com.au  Fact Sheet for Healthcare Providers:  IncredibleEmployment.be  This test is no t yet approved or cleared by the [place] FDA and  has been authorized for detection and/or diagnosis of SARS-CoV-2 by FDA under an Emergency Use Authorization (EUA). This EUA will remain  in effect (meaning this test can be used) for the duration of the COVID-19 declaration under Section 564(b)(1) of the Act, 21 U.S.C.section 360bbb-3(b)(1), unless the authorization is terminated  or revoked sooner.       Influenza A by PCR NEGATIVE NEGATIVE Final   Influenza B by PCR NEGATIVE NEGATIVE Final    Comment: (NOTE) The Xpert Xpress SARS-CoV-2/FLU/RSV plus assay is intended as an aid in the diagnosis of influenza from Nasopharyngeal swab specimens and should not be used as a sole basis for treatment. Nasal washings and aspirates are unacceptable for Xpert Xpress SARS-CoV-2/FLU/RSV testing.  Fact Sheet for Patients: EntrepreneurPulse.com.au  Fact Sheet for Healthcare Providers: IncredibleEmployment.be  This test is not yet approved or cleared by the [place] FDA and has been authorized for detection and/or diagnosis of SARS-CoV-2 by FDA under an Emergency Use Authorization (EUA). This EUA will remain in effect (meaning this test can be used) for the duration of the COVID-19 declaration under Section 564(b)(1) of the Act, 21 U.S.C. section 360bbb-3(b)(1), unless the authorization is terminated or revoked.  Performed at [facility], [place] [address], [place], [place] [address]   MRSA PCR Screening     Status: None   Collection Time: 12/04/20 12:00 AM   Specimen:  Nasopharyngeal  Result Value Ref Range Status   MRSA by PCR NEGATIVE NEGATIVE Final    Comment:        The GeneXpert MRSA Assay (FDA approved for NASAL specimens only), is one component of a comprehensive MRSA colonization surveillance program. It is not intended to diagnose MRSA infection nor to guide or monitor treatment for MRSA infections. Performed at [facility], [place] [address] [place], [place] [address]   Culture, sputum-assessment     Status: None   Collection Time: 12/04/20  5:20 AM   Specimen: Expectorated Sputum  Result Value Ref Range Status   Specimen Description EXPECTORATED SPUTUM  Final   Special Requests Immunocompromised  Final   Sputum evaluation   Final    THIS SPECIMEN IS ACCEPTABLE FOR SPUTUM CULTURE Performed at [facility], [place] [address] [place], [place] [address]    Report Status 12/04/2020 FINAL  Final  Culture, Respiratory w Gram Stain     Status: None   Collection Time: 12/04/20  5:20 AM  Result Value Ref Range Status   Specimen Description   Final    EXPECTORATED SPUTUM Performed at [facility] [address], [place], [place] [address]    Special Requests   Final    Immunocompromised Reflexed from [id number] Performed at [facility], [place] [address] [place], [place] [address]    Gram Stain   Final    FEW WBC PRESENT, PREDOMINANTLY PMN RARE GRAM POSITIVE COCCI RARE GRAM NEGATIVE RODS    Culture   Final    FEW Normal respiratory flora-no Staph aureus or Pseudomonas seen Performed at [facility], [place] [address], [place], [place] [address]    Report Status 12/06/2020 FINAL  Final  Radiology Studies: No results found.      Scheduled Meds: . acyclovir  400 mg Oral Daily  . allopurinol  300 mg Oral Daily  . amLODipine  10 mg Oral Daily  . azelastine  2 spray Each Nare Daily  . bisoprolol  10 mg Oral Daily  . Chlorhexidine Gluconate  Cloth  6 each Topical Daily  . cholecalciferol  2,000 Units Oral Daily  . citalopram  20 mg Oral q morning  . enoxaparin (LOVENOX) injection  40 mg Subcutaneous Q24H  . furosemide  20 mg Intravenous Q12H  . gabapentin  300 mg Oral QHS  . guaiFENesin  1,200 mg Oral BID  . hydrochlorothiazide  25 mg Oral Daily  . ipratropium-albuterol  3 mL Nebulization TID  . loratadine  10 mg Oral QHS  . mometasone-formoterol  2 puff Inhalation BID  . multivitamin  1 tablet Oral BID  . pantoprazole  40 mg Oral Daily  . predniSONE  40 mg Oral Q breakfast   Continuous Infusions: . ceFEPime (MAXIPIME) IV 2 g (12/07/20 0515)     LOS: 4 days    Time spent: 40 minutes    Irine Seal, MD [facility]   To contact the attending provider between 7A-7P or the covering provider during after hours 7P-7A, please log into the web site [email or web address] and access using universal  password for that web site. If you do not have the password, please call the hospital operator.  12/07/2020, 11:56 AM

## 2020-12-08 ENCOUNTER — Inpatient Hospital Stay (HOSPITAL_COMMUNITY): Payer: Medicare Other

## 2020-12-08 DIAGNOSIS — J9601 Acute respiratory failure with hypoxia: Secondary | ICD-10-CM | POA: Diagnosis not present

## 2020-12-08 DIAGNOSIS — J849 Interstitial pulmonary disease, unspecified: Secondary | ICD-10-CM | POA: Diagnosis not present

## 2020-12-08 DIAGNOSIS — J189 Pneumonia, unspecified organism: Secondary | ICD-10-CM | POA: Diagnosis not present

## 2020-12-08 DIAGNOSIS — N183 Chronic kidney disease, stage 3 unspecified: Secondary | ICD-10-CM | POA: Diagnosis not present

## 2020-12-08 DIAGNOSIS — R0600 Dyspnea, unspecified: Secondary | ICD-10-CM | POA: Diagnosis not present

## 2020-12-08 LAB — CBC WITH DIFFERENTIAL/PLATELET
Abs Immature Granulocytes: 0.77 10*3/uL — ABNORMAL HIGH (ref 0.00–0.07)
Basophils Absolute: 0.2 10*3/uL — ABNORMAL HIGH (ref 0.0–0.1)
Basophils Relative: 0 %
Eosinophils Absolute: 0.1 10*3/uL (ref 0.0–0.5)
Eosinophils Relative: 0 %
HCT: 34.4 % — ABNORMAL LOW (ref 36.0–46.0)
Hemoglobin: 9.8 g/dL — ABNORMAL LOW (ref 12.0–15.0)
Immature Granulocytes: 0 %
Lymphocytes Relative: 89 %
Lymphs Abs: 154.7 10*3/uL — ABNORMAL HIGH (ref 0.7–4.0)
MCH: 27.8 pg (ref 26.0–34.0)
MCHC: 28.5 g/dL — ABNORMAL LOW (ref 30.0–36.0)
MCV: 97.5 fL (ref 80.0–100.0)
Monocytes Absolute: 12.8 10*3/uL — ABNORMAL HIGH (ref 0.1–1.0)
Monocytes Relative: 7 %
Neutro Abs: 6.2 10*3/uL (ref 1.7–7.7)
Neutrophils Relative %: 4 %
Platelets: 137 10*3/uL — ABNORMAL LOW (ref 150–400)
RBC: 3.53 MIL/uL — ABNORMAL LOW (ref 3.87–5.11)
RDW: 18.2 % — ABNORMAL HIGH (ref 11.5–15.5)
WBC Morphology: ABNORMAL
WBC: 174.8 10*3/uL (ref 4.0–10.5)
nRBC: 0 % (ref 0.0–0.2)

## 2020-12-08 LAB — CULTURE, BLOOD (ROUTINE X 2)
Culture: NO GROWTH
Culture: NO GROWTH
Special Requests: ADEQUATE
Special Requests: ADEQUATE

## 2020-12-08 LAB — BASIC METABOLIC PANEL
Anion gap: 11 (ref 5–15)
BUN: 42 mg/dL — ABNORMAL HIGH (ref 8–23)
CO2: 30 mmol/L (ref 22–32)
Calcium: 9 mg/dL (ref 8.9–10.3)
Chloride: 98 mmol/L (ref 98–111)
Creatinine, Ser: 1.08 mg/dL — ABNORMAL HIGH (ref 0.44–1.00)
GFR, Estimated: 54 mL/min — ABNORMAL LOW (ref >60)
Glucose, Bld: 97 mg/dL (ref 70–99)
Potassium: 3.8 mmol/L (ref 3.5–5.1)
Sodium: 139 mmol/L (ref 135–145)

## 2020-12-08 LAB — PROCALCITONIN: Procalcitonin: <0.1 ng/mL

## 2020-12-08 LAB — MAGNESIUM: Magnesium: 2.1 mg/dL (ref 1.7–2.4)

## 2020-12-08 LAB — ECHOCARDIOGRAM COMPLETE
Area-P 1/2: 3.27 cm2
Height: 62 in
S' Lateral: 3.1 cm
Weight: 3433.6 oz

## 2020-12-08 MED ORDER — FUROSEMIDE 10 MG/ML IJ SOLN
20.0000 mg | Freq: Two times a day (BID) | INTRAMUSCULAR | Status: DC
  Administered 2020-12-08 (×2): 20 mg via INTRAVENOUS
  Filled 2020-12-08 (×2): qty 2

## 2020-12-08 MED ORDER — IPRATROPIUM-ALBUTEROL 0.5-2.5 (3) MG/3ML IN SOLN
3.0000 mL | Freq: Two times a day (BID) | RESPIRATORY_TRACT | Status: DC
  Administered 2020-12-09: 3 mL via RESPIRATORY_TRACT
  Filled 2020-12-08: qty 3

## 2020-12-08 MED ORDER — PREDNISONE 20 MG PO TABS
20.0000 mg | ORAL_TABLET | Freq: Every day | ORAL | Status: DC
  Administered 2020-12-09: 20 mg via ORAL
  Filled 2020-12-08: qty 1

## 2020-12-08 NOTE — Progress Notes (Signed)
Patients sister came to the nurses station asking this RN not to disclose any medical information to her sister. She is the healthcare power of attorney and asked that the MD speak to her in private. MD made aware.

## 2020-12-08 NOTE — Care Management Important Message (Signed)
Important Message  Patient Details IM Letter given to the Patient. Name: Erin Moore MRN: [id number] Date of Birth: 1946/05/12   Medicare Important Message Given:  Yes     [name] 12/08/2020, 1:53 PM

## 2020-12-08 NOTE — Progress Notes (Signed)
PROGRESS NOTE    Erin Moore  MGQ:[id number] DOB: August 27, 1946 DOA: 12/03/2020 PCP: Jonathon Jordan, MD    Chief Complaint  Patient presents with  . Shortness of Breath    Brief Narrative: Patient pleasant 75 year old female, history of CLL, hypertension, anxiety, mental disability/learning disorder being followed at the cancer center who presented to the cancer center with acute respiratory failure with hypoxia with sats in the 80s noted to be short of breath on minimal exertion.  Patient prior to this had been treated with oral steroids and antibiotics for symptoms of cough which had improved and subsequently returned after course of treatment was done.  Patient placed on 4 L nasal cannula.  CT angio chest which was done was negative for PE however consistent with bilateral multifocal pneumonia.  Patient admitted placed empirically on IV antibiotics.  Oncology following.   Assessment & Plan:   Principal Problem:   Acute respiratory failure with hypoxia (HCC) Active Problems:   Multifocal pneumonia   CLL (chronic lymphocytic leukemia) (HCC)   Thrombocytopenia, unspecified (HCC)   Pancytopenia, acquired (HCC)   CKD (chronic kidney disease), symptom management only, stage 3 (moderate) (HCC)   Bilateral interstitial pneumonia (HCC)   Pneumonia   Hyperkalemia  1 acute hypoxic respiratory failure secondary to multifocal pneumonia vs Volume overload Patient presented with acute hypoxic respiratory failure.??  Etiology patient noted to have had a cough a few weeks ago treated with cefdinir and prednisone with some intermittent improvement with symptoms however symptoms returned with dyspnea on minimal exertion after course of treatment was done.  CT angiogram chest done negative for PE however consistent with multifocal pneumonia. -Patient slowly improving clinically.  Patient however still hypoxic. -Patient pancultured.  Sputum Gram stain and culture with few normal respiratory flora, no  staph aureus or Pseudomonas seen.  -MRSA PCR negative.  SARS coronavirus 2 PCR negative.  Influenza A and B PCR negative.   -Urine strep pneumococcus antigen negative. -Patient still hypoxic.(Ambulatory sats of 81% on room air with ambulation, 88% on room air at rest, 93% on 2 L nasal cannula with ambulation--- 12/06/2020). patient still with cough. -Repeat ambulatory sats done today with sats of 88% on room air, sats on room air with ambulation 82%, sats on 2 L with ambulation 92%. -Patient placed on IV Lasix on 12/06/2020 and 12/07/2020.  With excellent response to diuretics.  Patient with a urine output of 4.4 L over the past 24 hours.  Patient is -8.9 L during this hospitalization.  Current weight of 97.3 kg from 103.4 kg on admission.  Concern for volume overload.   -BNP elevated at 119.9.  EKG with normal sinus rhythm.  2D echo pending.  -Placed on Lasix 20 mg IV every 12 hours.  -Strict I's and O's, daily weights.  -Check a procalcitonin level.  If procalcitonin level is negative will discontinue antibiotics after 5 days.  -If 2D echo is abnormal will need input from cardiology.  -Continue IV cefepime pending procalcitonin levels.  -IV vancomycin has been discontinued.  -Wean prednisone.  Continue duo nebs, Claritin, PPI, Dulera, Mucinex.  -Repeat ambulatory sats tomorrow.   2.  CLL Elevated leukocytosis secondary to CLL and treatment per oncology.  Treatment currently on hold.  Patient on IV antibiotics.  Per oncology.   3.  Acquired pancytopenia/thrombocytopenia/normocytic anemia Likely secondary to chemo treatment.  Patient with no overt bleeding.  Hemoglobin currently at 9.8.  Platelet count at 137.  Transfusion threshold hemoglobin <7.  Per oncology.  4.  Chronic kidney disease stage IIIb Currently stable.  Creatinine better than baseline.  Follow.  5.  Hyperkalemia Resolved.  Potassium at 3.8.  Continue to hold ARB.  Follow.    6.  Hypertension Controlled on current regimen  of Zebeta, Norvasc, HCTZ.  On IV Lasix.  Continue to hold ARB.   DVT prophylaxis: Lovenox Code Status: Full Family Communication: Updated patient.  Updated Sister Tawana Scale at bedside.    Disposition:   Status is: Inpatient    Dispo: The patient is from: Group home              Anticipated d/c is to: Likely back to group home              Anticipated d/c date is: 1-2 days              Patient currently on IV antibiotics, with ongoing hypoxia, concern for volume overload, on IV Lasix.  Not stable for discharge.    Difficult to place patient no       Consultants:   Oncology: Dr. Alvy Bimler  Procedures:   CT angiogram chest 12/03/2020  2D echo pending 12/08/2020  Antimicrobials:  IV cefepime 12/03/2020>>>>  IV vancomycin 12/03/2020>>>> 12/04/2020   Subjective: Sitting up in bed.  States cough and shortness of breath improving.  Still hypoxic on room air.  Asking when she is going to be able to go home.  Good urine output over the past 24 hours with diuretics.  Just finished 2D echo being done at bedside.  Objective: Vitals:   12/07/20 2205 12/08/20 0534 12/08/20 0540 12/08/20 0759  BP: (!) 127/57  (!) 132/59   Pulse: 65  67   Resp: 17  15   Temp: 97.7 F (36.5 C)  (!) 97.4 F (36.3 C)   TempSrc: Oral  Oral   SpO2: 94%  94% 93%  Weight:  97.3 kg    Height:        Intake/Output Summary (Last 24 hours) at 12/08/2020 1150 Last data filed at 12/08/2020 1122 Gross per 24 hour  Intake 634.97 ml  Output 4500 ml  Net -3865.03 ml   Filed Weights   12/03/20 2013 12/05/20 1829 12/08/20 0534  Weight: 103.4 kg 101.5 kg 97.3 kg    Examination:  General exam: NAD Respiratory system: Decreased breath sounds in the bases.  Scattered coarse breath sounds/crackles.  Fair air movement.  No wheezing.  Speaking in full sentences.  Cardiovascular system: RRR no murmurs rubs or gallops.  No JVD.  No lower extremity edema.  Gastrointestinal system: Abdomen is soft, nontender,  nondistended, positive bowel sounds.  No rebound.  No guarding.  Central nervous system: Alert and oriented.  No focal neurological deficits.   Extremities: Symmetric 5 x 5 power. Skin: No rashes, lesions or ulcers Psychiatry: Judgement and insight appear fair. Mood & affect appropriate.     Data Reviewed: I have personally reviewed following labs and imaging studies  CBC: Recent Labs  Lab 12/03/20 0950 12/03/20 2034 12/04/20 0313 12/05/20 0310 12/06/20 0400 12/07/20 0306 12/08/20 0310  WBC 155.4*   < > 123.8* 136.8* 134.2* 145.1* 174.8*  NEUTROABS 7.8*  --   --  13.7*  --  6.5 6.2  HGB 10.3*   < > 9.8* 8.7* 8.8* 9.2* 9.8*  HCT 35.5*   < > 33.9* 31.0* 31.1* 32.2* 34.4*  MCV 95.2   < > 97.7 97.8 99.0 97.6 97.5  PLT 133*   < > 117* 126*  123* 125* 137*   < > = values in this interval not displayed.    Basic Metabolic Panel: Recent Labs  Lab 12/04/20 0313 12/05/20 0310 12/06/20 0400 12/07/20 0306 12/08/20 0310  NA 144 142 139 138 139  K 5.2* 4.1 4.0 4.5 3.8  CL 108 106 104 102 98  CO2 28 27 27 28 30   GLUCOSE 137* 127* 96 107* 97  BUN 25* 32* 35* 36* 42*  CREATININE 1.39* 1.11* 1.06* 0.97 1.08*  CALCIUM 9.1 8.7* 8.9 8.8* 9.0  MG  --  2.0 2.1 2.1 2.1    GFR: Estimated Creatinine Clearance: 49.8 mL/min (A) (by C-G formula based on SCr of 1.08 mg/dL (H)).  Liver Function Tests: Recent Labs  Lab 12/03/20 0950 12/04/20 0313  AST 12* 13*  ALT 14 16  ALKPHOS 117 102  BILITOT 0.5 0.9  PROT 5.9* 5.9*  ALBUMIN 3.6 3.8    CBG: No results for input(s): GLUCAP in the last 168 hours.   Recent Results (from the past 240 hour(s))  Blood Culture (routine x 2)     Status: None   Collection Time: 12/03/20 11:02 AM   Specimen: BLOOD  Result Value Ref Range Status   Specimen Description   Final    BLOOD SITE NOT SPECIFIED Performed at [facility], [address], [place], [place] [address]    Special Requests   Final    BOTTLES DRAWN AEROBIC AND ANAEROBIC  Blood Culture adequate volume Performed at [facility] [address], [place], [place] [address]    Culture   Final    NO GROWTH 5 DAYS Performed at [facility], [place] [address] [place], [place] [address]    Report Status 12/08/2020 FINAL  Final  Blood Culture (routine x 2)     Status: None   Collection Time: 12/03/20 11:33 AM   Specimen: BLOOD  Result Value Ref Range Status   Specimen Description   Final    BLOOD RIGHT ANTECUBITAL Performed at [facility] [address] [place], [place] [address]    Special Requests   Final    BOTTLES DRAWN AEROBIC AND ANAEROBIC Blood Culture adequate volume Performed at [facility] [address] [place], [place] [address]    Culture   Final    NO GROWTH 5 DAYS Performed at [facility], [place] [address] [place], [place] [address]    Report Status 12/08/2020 FINAL  Final  Urine culture     Status: None   Collection Time: 12/03/20 12:34 PM   Specimen: In/Out Cath Urine  Result Value Ref Range Status   Specimen Description   Final    IN/OUT CATH URINE Performed at [facility] [address], [place], [place] [address]    Special Requests   Final    NONE Performed at [facility], [place] [address] [place], [place] [address]    Culture   Final    NO GROWTH Performed at [facility], [place] [address], [place],  [address]    Report Status 12/04/2020 FINAL  Final  Resp Panel by RT-PCR (Flu A&B, Covid) Nasopharyngeal Swab     Status: None   Collection Time: 12/03/20  3:20 PM   Specimen: Nasopharyngeal Swab; Nasopharyngeal(NP) swabs in vial transport medium  Result Value Ref Range Status   SARS Coronavirus 2 by RT PCR NEGATIVE NEGATIVE Final    Comment: (NOTE) SARS-CoV-2 target  nucleic acids are NOT DETECTED.  The SARS-CoV-2 RNA is generally detectable in upper respiratory specimens during the acute phase of  infection. The lowest concentration of SARS-CoV-2 viral copies this assay can detect is 138 copies/mL. A negative result does not preclude SARS-Cov-2 infection and should not be used as the sole basis for treatment or other patient management decisions. A negative result may occur with  improper specimen collection/handling, submission of specimen other than nasopharyngeal swab, presence of viral mutation(s) within the areas targeted by this assay, and inadequate number of viral copies(<138 copies/mL). A negative result must be combined with clinical observations, patient history, and epidemiological information. The expected result is Negative.  Fact Sheet for Patients:  EntrepreneurPulse.com.au  Fact Sheet for Healthcare Providers:  IncredibleEmployment.be  This test is no t yet approved or cleared by the [place] FDA and  has been authorized for detection and/or diagnosis of SARS-CoV-2 by FDA under an Emergency Use Authorization (EUA). This EUA will remain  in effect (meaning this test can be used) for the duration of the COVID-19 declaration under Section 564(b)(1) of the Act, 21 U.S.C.section 360bbb-3(b)(1), unless the authorization is terminated  or revoked sooner.       Influenza A by PCR NEGATIVE NEGATIVE Final   Influenza B by PCR NEGATIVE NEGATIVE Final    Comment: (NOTE) The Xpert Xpress SARS-CoV-2/FLU/RSV plus assay is intended as an aid in the diagnosis of influenza from Nasopharyngeal swab specimens and should not be used as a sole basis for treatment. Nasal washings and aspirates are unacceptable for Xpert Xpress SARS-CoV-2/FLU/RSV testing.  Fact Sheet for Patients: EntrepreneurPulse.com.au  Fact Sheet for Healthcare Providers: IncredibleEmployment.be  This test is not yet approved or cleared by the [place] FDA and has been authorized for detection and/or diagnosis of SARS-CoV-2  by FDA under an Emergency Use Authorization (EUA). This EUA will remain in effect (meaning this test can be used) for the duration of the COVID-19 declaration under Section 564(b)(1) of the Act, 21 U.S.C. section 360bbb-3(b)(1), unless the authorization is terminated or revoked.  Performed at [facility], [place] [address], [place], [place] [address]   MRSA PCR Screening     Status: None   Collection Time: 12/04/20 12:00 AM   Specimen: Nasopharyngeal  Result Value Ref Range Status   MRSA by PCR NEGATIVE NEGATIVE Final    Comment:        The GeneXpert MRSA Assay (FDA approved for NASAL specimens only), is one component of a comprehensive MRSA colonization surveillance program. It is not intended to diagnose MRSA infection nor to guide or monitor treatment for MRSA infections. Performed at [facility], [place] [address], [place], [place] [address]   Culture, sputum-assessment     Status: None   Collection Time: 12/04/20  5:20 AM   Specimen: Expectorated Sputum  Result Value Ref Range Status   Specimen Description EXPECTORATED SPUTUM  Final   Special Requests Immunocompromised  Final   Sputum evaluation   Final    THIS SPECIMEN IS ACCEPTABLE FOR SPUTUM CULTURE Performed at [facility], [place] [address] [place], [place] [address]    Report Status 12/04/2020 FINAL  Final  Culture, Respiratory w Gram Stain     Status: None   Collection Time: 12/04/20  5:20 AM  Result Value Ref Range Status   Specimen Description   Final    EXPECTORATED SPUTUM Performed at [facility] [address], [place], [place] [address]  Special Requests   Final    Immunocompromised Reflexed from [id number] Performed at [facility], [place] [address] [place], [place] [address]    Gram Stain   Final    FEW WBC PRESENT, PREDOMINANTLY PMN RARE GRAM POSITIVE COCCI RARE GRAM NEGATIVE RODS    Culture   Final     FEW Normal respiratory flora-no Staph aureus or Pseudomonas seen Performed at [facility], [place] [address] [place], [place] [address]    Report Status 12/06/2020 FINAL  Final         Radiology Studies: DG Chest 2 View  Result Date: 12/07/2020 CLINICAL DATA:  75 year old female with a history of CLL and hypoxia EXAM: CHEST - 2 VIEW COMPARISON:  Chest CT 12/03/2020, plain film 11/11/2020 FINDINGS: Cardiomediastinal silhouette unchanged in size and contour. Rounded opacity in the right hilar region, corresponding to adenopathy on the contemporaneous chest CT. Pattern of reticulonodular opacities of the bilateral lungs, corresponding to findings on prior CT. On the lateral view there is partial volume loss/consolidation of the right middle lobe, present on the CT. Minimal blunting of the right costophrenic angle. No pneumothorax. Right IJ port catheter. IMPRESSION: Reticulonodular opacities, compatible with multifocal infection and present on recent CT. As was noted, pulmonary lymphoma considered less likely. Partial volume loss/consolidation of the right middle lobe, present on comparison CT. Trace right-sided pleural fluid. Unchanged right port catheter. Electronically Signed   By: Corrie Mckusick D.O.   On: 12/07/2020 15:25        Scheduled Meds: . acyclovir  400 mg Oral Daily  . allopurinol  300 mg Oral Daily  . amLODipine  10 mg Oral Daily  . azelastine  2 spray Each Nare Daily  . bisoprolol  10 mg Oral Daily  . Chlorhexidine Gluconate Cloth  6 each Topical Daily  . cholecalciferol  2,000 Units Oral Daily  . citalopram  20 mg Oral q morning  . enoxaparin (LOVENOX) injection  40 mg Subcutaneous Q24H  . furosemide  20 mg Intravenous Q12H  . gabapentin  300 mg Oral QHS  . guaiFENesin  1,200 mg Oral BID  . hydrochlorothiazide  25 mg Oral Daily  . ipratropium-albuterol  3 mL Nebulization TID  . loratadine  10 mg Oral QHS  . mometasone-formoterol  2 puff Inhalation BID  .  multivitamin  1 tablet Oral BID  . pantoprazole  40 mg Oral Daily  . predniSONE  40 mg Oral Q breakfast   Continuous Infusions: . ceFEPime (MAXIPIME) IV 2 g (12/08/20 0530)     LOS: 5 days    Time spent: 40 minutes    Irine Seal, MD [facility]   To contact the attending provider between 7A-7P or the covering provider during after hours 7P-7A, please log into the web site [email or web address] and access using universal [place] password for that web site. If you do not have the password, please call the hospital operator.  12/08/2020, 11:50 AM

## 2020-12-08 NOTE — TOC Progression Note (Signed)
Transition of Care [facility]) - Progression Note    Patient Details  Name: GEARLDINE LOONEY MRN: [id number] Date of Birth: [birth year]  Transition of [facility]) CM/SW Contact  Lennart Pall, [place] Phone Number: 12/08/2020, 2:12 PM  Clinical Narrative:    With pt agreement, have spoken with Anderson Malta, RN at [facility] to begin discussion/ preparation for her return to ALF.  Per MD notes, it appears pt may be ready for dc in next couple of days?  Facility requests a COVID test be completed within 24 hours of dc.  Facility aware she will likely dc with oxygen - this SW will place order for this.  Will continue to follow for any additional dc needs.   Expected Discharge Plan: Assisted Living Barriers to Discharge: Continued Medical Work up  Expected Discharge Plan and Services Expected Discharge Plan: Assisted Living In-house Referral: Clinical Social Work     Living arrangements for the past 2 months: [place]                                       Social Determinants of Health (SDOH) Interventions    Readmission Risk Interventions Readmission Risk Prevention Plan 12/04/2020  Transportation Screening Complete  PCP or Specialist Appt within 5-7 Days Complete  Home Care Screening Complete  Some recent data might be hidden

## 2020-12-08 NOTE — Progress Notes (Signed)
SATURATION QUALIFICATIONS: (This note is used to comply with regulatory documentation for home oxygen)  Patient Saturations on Room Air at Rest = 88%  Patient Saturations on Room Air while Ambulating = 82%  Patient Saturations on 2 Liters of oxygen while Ambulating = 92%  Please briefly explain why patient needs home oxygen:Patients oxygen saturation drops when ambulating.

## 2020-12-08 NOTE — Progress Notes (Signed)
Erin Moore   DOB:11-24-1945   XT#:[id number]    ASSESSMENT & PLAN:  CLL (chronic lymphocytic leukemia) ([place]) Her extreme leukocytosis is due to her CLL and treatment Currently admitted for pneumonia Acalabrutinib has been placed on hold She is receiving broad-spectrum IV antibiotics We will resume her treatment outpatient  Bilateral interstitial pneumonia ([place]) Her last chest x-ray showed bilateral interstitial infiltrate Her Covid test was negative Overall, her symptoms are improved but she still have significant productive cough and hypoxia She will continue broad-spectrum IV antibiotics, probably to be discontinued in view of possible diagnosis of CHF  Elevated BNP, good urine output with diuresis She likely have component of heart failure I agree with Dr. Biagio Borg assessment and work-up for CHF  Pancytopenia, acquired [facility]) She has persistent pancytopenia due to treatment She also have B12 deficiency and has received aggressive vitamin B12 supplement Observe closely for now  Chronic kidney disease stage III Monitor closely  Goals of care, counseling/discussion Due to the patient's mild baseline mental deficit, her sister/HCPOAmakes all her medical decision I have updated her sister today  Discharge Planning The patient will need improvement of her hypoxia and overall respiratory status before discharge can be considered She is not ready today but hopefully the next 24 to 48 hours  All questions were answered. The patient knows to call the clinic with any problems, questions or concerns.   Heath Lark, MD 12/08/2020 8:27 AM  Subjective:  Her chart was reviewed The patient was noted to have significant shortness of breath over the weekend and furosemide was added The patient has very good output of urine and with improvement of her cough and shortness of breath Preliminary diagnosis of possible fluid retention/CHF is made Echocardiogram is ordered and  pending She has been afebrile since admission  Objective:  Vitals:   12/08/20 0540 12/08/20 0759  BP: (!) 132/59   Pulse: 67   Resp: 15   Temp: (!) 97.4 F (36.3 C)   SpO2: 94% 93%     Intake/Output Summary (Last 24 hours) at 12/08/2020 0827 Last data filed at 12/08/2020 2992 Gross per 24 hour  Intake 630.97 ml  Output 4400 ml  Net -3769.03 ml    GENERAL:alert, no distress and comfortable SKIN: skin color, texture, turgor are normal, no rashes or significant lesions EYES: normal, Conjunctiva are pink and non-injected, sclera clear OROPHARYNX:no exudate, no erythema and lips, buccal mucosa, and tongue normal  NECK: supple, thyroid normal size, non-tender, without nodularity LYMPH:  no palpable lymphadenopathy in the cervical, axillary or inguinal LUNGS: clear to auscultation and percussion with normal breathing effort HEART: regular rate & rhythm and no murmurs and no lower extremity edema ABDOMEN:abdomen soft, non-tender and normal bowel sounds Musculoskeletal:no cyanosis of digits and no clubbing  NEURO: alert & oriented x 3 with fluent speech, no focal motor/sensory deficits   Labs:  Recent Labs    05/05/20 1007 06/09/20 1037 07/21/20 1120 08/25/20 1129 11/11/20 1320 12/03/20 0950 12/03/20 2034 12/04/20 0313 12/05/20 0310 12/06/20 0400 12/07/20 0306 12/08/20 0310  NA 142 140 142   < > 141 142  --  144   < > 139 138 139  K 4.4 3.8 4.2   < > 4.4 4.3  --  5.2*   < > 4.0 4.5 3.8  CL 103 103 105   < > 106 106  --  108   < > 104 102 98  CO2 26 27 29    < > 26 28  --  28   < > 27 28 30   GLUCOSE 106* 106* 102*   < > 103* 98  --  137*   < > 96 107* 97  BUN 29* 22 26*   < > 26* 22  --  25*   < > 35* 36* 42*  CREATININE 1.18* 1.29* 1.26*   < > 1.47* 1.55*   < > 1.39*   < > 1.06* 0.97 1.08*  CALCIUM 9.7 9.7 9.5   < > 9.2 9.1  --  9.1   < > 8.9 8.8* 9.0  GFRNONAA 45* 41* 42*   < > 37* 35*   < > 40*   < > 55* >60 54*  GFRAA 53* 47* 49*  --   --   --   --   --   --   --   --    --   PROT 6.3* 5.9* 6.1*   < > 6.0* 5.9*  --  5.9*  --   --   --   --   ALBUMIN 4.3 3.7 3.8   < > 3.9 3.6  --  3.8  --   --   --   --   AST 11* 14* 19   < > 13* 12*  --  13*  --   --   --   --   ALT 12 10 14    < > 8 14  --  16  --   --   --   --   ALKPHOS 79 86 83   < > 95 117  --  102  --   --   --   --   BILITOT 0.4 0.4 0.5   < > 0.5 0.5  --  0.9  --   --   --   --    < > = values in this interval not displayed.    Studies:  DG Chest 2 View  Result Date: 12/07/2020 CLINICAL DATA:  75 year old female with a history of CLL and hypoxia EXAM: CHEST - 2 VIEW COMPARISON:  Chest CT 12/03/2020, plain film 11/11/2020 FINDINGS: Cardiomediastinal silhouette unchanged in size and contour. Rounded opacity in the right hilar region, corresponding to adenopathy on the contemporaneous chest CT. Pattern of reticulonodular opacities of the bilateral lungs, corresponding to findings on prior CT. On the lateral view there is partial volume loss/consolidation of the right middle lobe, present on the CT. Minimal blunting of the right costophrenic angle. No pneumothorax. Right IJ port catheter. IMPRESSION: Reticulonodular opacities, compatible with multifocal infection and present on recent CT. As was noted, pulmonary lymphoma considered less likely. Partial volume loss/consolidation of the right middle lobe, present on comparison CT. Trace right-sided pleural fluid. Unchanged right port catheter. Electronically Signed   By: Corrie Mckusick D.O.   On: 12/07/2020 15:25   DG Chest 2 View  Result Date: 11/11/2020 CLINICAL DATA:  75 year old female with cough. Chronic lymphocytic leukemia. EXAM: CHEST - 2 VIEW COMPARISON:  Chest radiograph dated 03/24/2018 FINDINGS: Port-A-Cath in similar position. Bilateral streaky and nodular densities most consistent with developing infiltrate, likely viral or atypical in etiology including COVID-19. Clinical correlation is recommended. The cardiac silhouette is within limits.  Atherosclerotic calcification of the aorta. Right hilar mass/adenopathy measuring approximately 5 cm in craniocaudal length. No acute osseous pathology. Degenerative changes of the spine. IMPRESSION: 1. Findings most consistent with developing infiltrate. 2. Right hilar mass/adenopathy in keeping with history of CLL. CT may provide better evaluation if clinically  indicated. Electronically Signed   By: Anner Crete M.D.   On: 11/11/2020 20:52   CT Angio Chest PE W and/or Wo Contrast  Result Date: 12/03/2020 CLINICAL DATA:  Concern for pulmonary embolism. History of chronic lymphocytic leukemia. EXAM: CT ANGIOGRAPHY CHEST WITH CONTRAST TECHNIQUE: Multidetector CT imaging of the chest was performed using the standard protocol during bolus administration of intravenous contrast. Multiplanar CT image reconstructions and MIPs were obtained to evaluate the vascular anatomy. CONTRAST:  81mL OMNIPAQUE IOHEXOL 350 MG/ML SOLN COMPARISON:  CT 08/11/2020 FINDINGS: Cardiovascular: No filling defects within the pulmonary arteries to suggest acute pulmonary embolism. Mediastinum/Nodes: Bulky mediastinal perihilar lymphadenopathy again noted. For example lymph node along the RIGHT bronchus intermedius measuring 25 mm compares to 25 mm. RIGHT lower paratracheal node measuring 18 mm compares to 15 mm. Lungs/Pleura: There is patchy bilateral nodular airspace disease which is worsened from comparison exam. Consolidation and peripheral nodularity in the posterior RIGHT upper lobe on image 65/6 is worsened. There is a consolidation at the LEFT lung base measuring 3.4 cm (image 88/6) which is new. There is atelectasis in the RIGHT middle lobe slightly increased. There is a RIGHT pleural effusion which is new. W Ithin the lung apices there is patchy nodular airspace disease also worsened from comparison exam. Upper Abdomen: A periaortic retroperitoneal adenopathy similar to prior. Spleen appears enlarged. Musculoskeletal: No  aggressive osseous lesion Review of the MIP images confirms the above findings. IMPRESSION: 1. No acute pulmonary embolism. 2. New bilateral patchy, nodular and consolidative airspace disease most consistent multifocal pneumonia including atypical pneumonia. Secondary differential would include a pulmonary lymphoma, less favored. 3. Bulky mediastinal and axillary lymphadenopathy related to CLL. No change from prior. 4. Splenomegaly and upper abdominal adenopathy related to CLL. Electronically Signed   By: Suzy Bouchard M.D.   On: 12/03/2020 13:41   MM DIAG BREAST TOMO UNI LEFT  Result Date: 11/10/2020 CLINICAL DATA:  Callback from screening mammogram for possible asymmetry left breast EXAM: DIGITAL DIAGNOSTIC UNILATERAL LEFT MAMMOGRAM WITH TOMO AND CAD TECHNIQUE: Left digital diagnostic mammography and breast tomosynthesis was performed. Digital images of the breasts were evaluated with computer-aided detection. COMPARISON:  Prior films ACR Breast Density Category b: There are scattered areas of fibroglandular density. FINDINGS: Cc and MLO views of the left breast are submitted. Previously questioned asymmetry in the medial left breast does not persist on additional views. No suspicious abnormality is identified. IMPRESSION: Benign findings. RECOMMENDATION: Routine screening mammogram back on schedule. I have discussed the findings and recommendations with the patient. If applicable, a reminder letter will be sent to the patient regarding the next appointment. BI-RADS CATEGORY  2: Benign. Electronically Signed   By: Abelardo Diesel M.D.   On: 11/10/2020 10:27

## 2020-12-09 ENCOUNTER — Other Ambulatory Visit (HOSPITAL_COMMUNITY): Payer: Self-pay | Admitting: Internal Medicine

## 2020-12-09 DIAGNOSIS — N183 Chronic kidney disease, stage 3 unspecified: Secondary | ICD-10-CM | POA: Diagnosis not present

## 2020-12-09 DIAGNOSIS — E8770 Fluid overload, unspecified: Secondary | ICD-10-CM

## 2020-12-09 DIAGNOSIS — I5033 Acute on chronic diastolic (congestive) heart failure: Secondary | ICD-10-CM

## 2020-12-09 DIAGNOSIS — J849 Interstitial pulmonary disease, unspecified: Secondary | ICD-10-CM | POA: Diagnosis not present

## 2020-12-09 DIAGNOSIS — J9601 Acute respiratory failure with hypoxia: Secondary | ICD-10-CM | POA: Diagnosis not present

## 2020-12-09 DIAGNOSIS — J189 Pneumonia, unspecified organism: Secondary | ICD-10-CM | POA: Diagnosis not present

## 2020-12-09 DIAGNOSIS — I5031 Acute diastolic (congestive) heart failure: Secondary | ICD-10-CM

## 2020-12-09 LAB — CBC WITH DIFFERENTIAL/PLATELET
Abs Immature Granulocytes: 0.8 10*3/uL — ABNORMAL HIGH (ref 0.00–0.07)
Basophils Absolute: 0.1 10*3/uL (ref 0.0–0.1)
Basophils Relative: 0 %
Eosinophils Absolute: 0.1 10*3/uL (ref 0.0–0.5)
Eosinophils Relative: 0 %
HCT: 34.1 % — ABNORMAL LOW (ref 36.0–46.0)
Hemoglobin: 9.4 g/dL — ABNORMAL LOW (ref 12.0–15.0)
Immature Granulocytes: 0 %
Lymphocytes Relative: 91 %
Lymphs Abs: 169.5 10*3/uL — ABNORMAL HIGH (ref 0.7–4.0)
MCH: 27.2 pg (ref 26.0–34.0)
MCHC: 27.6 g/dL — ABNORMAL LOW (ref 30.0–36.0)
MCV: 98.6 fL (ref 80.0–100.0)
Monocytes Absolute: 9.9 10*3/uL — ABNORMAL HIGH (ref 0.1–1.0)
Monocytes Relative: 5 %
Neutro Abs: 6.7 10*3/uL (ref 1.7–7.7)
Neutrophils Relative %: 4 %
Platelets: 138 10*3/uL — ABNORMAL LOW (ref 150–400)
RBC: 3.46 MIL/uL — ABNORMAL LOW (ref 3.87–5.11)
RDW: 18.2 % — ABNORMAL HIGH (ref 11.5–15.5)
WBC: 187.2 10*3/uL (ref 4.0–10.5)
nRBC: 0 % (ref 0.0–0.2)

## 2020-12-09 LAB — BASIC METABOLIC PANEL
Anion gap: 10 (ref 5–15)
BUN: 43 mg/dL — ABNORMAL HIGH (ref 8–23)
CO2: 32 mmol/L (ref 22–32)
Calcium: 8.9 mg/dL (ref 8.9–10.3)
Chloride: 99 mmol/L (ref 98–111)
Creatinine, Ser: 1.19 mg/dL — ABNORMAL HIGH (ref 0.44–1.00)
GFR, Estimated: 48 mL/min — ABNORMAL LOW (ref >60)
Glucose, Bld: 107 mg/dL — ABNORMAL HIGH (ref 70–99)
Potassium: 3.3 mmol/L — ABNORMAL LOW (ref 3.5–5.1)
Sodium: 141 mmol/L (ref 135–145)

## 2020-12-09 LAB — MAGNESIUM: Magnesium: 2.3 mg/dL (ref 1.7–2.4)

## 2020-12-09 LAB — RESP PANEL BY RT-PCR (FLU A&B, COVID) ARPGX2
Influenza A by PCR: NEGATIVE
Influenza B by PCR: NEGATIVE
SARS Coronavirus 2 by RT PCR: NEGATIVE

## 2020-12-09 MED ORDER — FUROSEMIDE 20 MG PO TABS: 20.0000 mg | ORAL_TABLET | Freq: Every day | ORAL | 1 refills | Status: DC

## 2020-12-09 MED ORDER — LORATADINE 10 MG PO TABS: 10.0000 mg | ORAL_TABLET | Freq: Every day | ORAL | 0 refills | Status: DC

## 2020-12-09 MED ORDER — PANTOPRAZOLE SODIUM 40 MG PO TBEC: 40.0000 mg | DELAYED_RELEASE_TABLET | Freq: Every day | ORAL | 1 refills | Status: DC

## 2020-12-09 MED ORDER — GUAIFENESIN ER 600 MG PO TB12: 1200.0000 mg | ORAL_TABLET | Freq: Two times a day (BID) | ORAL | 0 refills | Status: DC

## 2020-12-09 MED ORDER — POTASSIUM CHLORIDE CRYS ER 20 MEQ PO TBCR
40.0000 meq | EXTENDED_RELEASE_TABLET | Freq: Once | ORAL | Status: AC
  Administered 2020-12-09: 40 meq via ORAL
  Filled 2020-12-09: qty 2

## 2020-12-09 MED ORDER — ACALABRUTINIB 100 MG PO CAPS: 100.0000 mg | ORAL_CAPSULE | Freq: Two times a day (BID) | ORAL | 11 refills | Status: DC

## 2020-12-09 MED ORDER — FUROSEMIDE 20 MG PO TABS
20.0000 mg | ORAL_TABLET | Freq: Every day | ORAL | Status: DC
  Administered 2020-12-09: 20 mg via ORAL
  Filled 2020-12-09: qty 1

## 2020-12-09 MED ORDER — IPRATROPIUM-ALBUTEROL 0.5-2.5 (3) MG/3ML IN SOLN: 3.0000 mL | Freq: Four times a day (QID) | RESPIRATORY_TRACT | Status: DC | PRN

## 2020-12-09 MED ORDER — HEPARIN SOD (PORK) LOCK FLUSH 100 UNIT/ML IV SOLN
500.0000 [IU] | INTRAVENOUS | Status: AC | PRN
  Administered 2020-12-09: 500 [IU]
  Filled 2020-12-09: qty 5

## 2020-12-09 MED FILL — PANTOPRAZOLE SOD DR 40 MG T: 40 | 30 days supply | Qty: 30 | Fill #0

## 2020-12-09 MED FILL — FUROSEMIDE 20 MG TABS: 20 | 30 days supply | Qty: 30 | Fill #0

## 2020-12-09 NOTE — TOC Transition Note (Signed)
Transition of Care [facility]) - CM/SW Discharge Note   Patient Details  Name: Erin Moore MRN: [id number] Date of Birth: [birth year]  [facility] of [facility] CM/SW Contact:  Lennart Pall, LCSW Phone Number: 12/09/2020, 4:48 PM   Clinical Narrative:    Pt medically cleared for dc today and no longer requiring oxygen support.  Pt and sister agreed and sister to provide transportation back to pt's ALF apt at [facility].  Have sent requested documentation to RN at [facility] and they are prepared for pt's return today.  No further TOC needs.   Final next level of care: Assisted Living Barriers to Discharge: Barriers Resolved   Patient Goals and CMS Choice Patient states their goals for this hospitalization and ongoing recovery are:: return to her apartment at [facility]      Discharge Placement              Patient chooses bed at: [facility] Patient to be transferred to facility by: sister Name of family member notified: sister Patient and family notified of of transfer: 12/09/20  Discharge Plan and Services In-house Referral: Clinical Social Work              DME Arranged: N/A DME Agency: NA                  Social Determinants of Health ([place]) Interventions     Readmission Risk Interventions Readmission Risk Prevention Plan 12/04/2020  Transportation Screening Complete  PCP or Specialist Appt within 5-7 Days Complete  Home Care Screening Complete  Some recent data might be hidden

## 2020-12-09 NOTE — Progress Notes (Signed)
Patient was given discharge instructions, and all questions were answered.  Patient was stable for discharge and was taken to the main exit by wheelchair. 

## 2020-12-09 NOTE — Discharge Summary (Addendum)
Physician Discharge Summary  Erin Moore BPZ:[id number] DOB: 1946/05/15 DOA: 12/03/2020  PCP: Jonathon Jordan, MD  Admit date: 12/03/2020 Discharge date: 12/09/2020  Time spent: 55 minutes  Recommendations for Outpatient Follow-up:  1. Follow-up with Dr. Alvy Bimler, hematology/oncology as scheduled.  On follow-up patient will need a basic metabolic profile done to follow-up on electrolytes and renal function.  Patient's oxygenation status will need to be reassessed on follow-up.  Patient may benefit from outpatient referral to cardiology. 2. Follow-up with Jonathon Jordan, MD in 2 weeks.  On follow-up patient will need a basic metabolic profile done to follow-up on electrolytes and renal function.  Patient's diastolic CHF need to be reassessed on follow-up as well as volume status.  Patient may benefit from outpatient referral to cardiology.   Discharge Diagnoses:  Principal Problem:   Acute respiratory failure with hypoxia (HCC) Active Problems:   Multifocal pneumonia   CLL (chronic lymphocytic leukemia) (HCC)   Thrombocytopenia, unspecified (HCC)   Pancytopenia, acquired (HCC)   CKD (chronic kidney disease), symptom management only, stage 3 (moderate) (HCC)   Bilateral interstitial pneumonia (HCC)   Pneumonia   Hyperkalemia   Acute diastolic CHF (congestive heart failure) ([place])   Hypervolemia   Discharge Condition: Stable and improved  Diet recommendation: Heart healthy  Filed Weights   12/05/20 1829 12/08/20 0534 12/09/20 0544  Weight: 101.5 kg 97.3 kg 97 kg    History of present illness:  HPI per Dr. Minerva Fester is a 75 y.o. female with medical history significant of CLL, HTN, anxiety. Presenting with dyspnea. She was at the Cancer center for follow up with her oncologist on day of admission. She has had difficulty with cough for several weeks. Her oncologist placed her on steroids and cefdinir after OTC medicine did not provide relief. Her symptoms improved, but as  soon as the steroids and abx were stopped, her symptoms returned. When she was seen in clinic on day of admission, she was found to be dyspneic and hypoxic to the 80's, so she was sent to the ED. She denied any other aggravating or alleviating factors. Of note, she is currently on infusions for CLL.   ED Course: She was started on 4L [place] and CAP coverage. PNA showed multifocal PNA. TRH was called for admission.   Hospital Course:  1 acute hypoxic respiratory failure secondary to volume overload/acute diastolic CHF +/- multifocal pneumonia. Patient presented with acute hypoxic respiratory failure.??  Etiology patient noted to have had a cough a few weeks ago treated with cefdinir and prednisone with some intermittent improvement with symptoms however symptoms returned with dyspnea on minimal exertion after course of treatment was done.  CT angiogram chest done negative for PE however consistent with multifocal pneumonia. -Patient slowly improved clinically during the hospitalization.  -Hypoxia slowly improved during the hospitalization. -Patient pancultured.  Sputum Gram stain and culture with few normal respiratory flora, no staph aureus or Pseudomonas seen.  -MRSA PCR negative.  SARS coronavirus 2 PCR negative.  Influenza A and B PCR negative.   -Urine strep pneumococcus antigen negative. -Patient noted early on during the hospitalization to have ongoing hypoxia with ambulatory sats of 81% on room air and 88% on room air.   -Patient placed on IV Lasix with good urine output clinical improvement.  Patient was -10.7 L during this hospitalization.  Patient's weight on discharge was 97 kg from 103.4 kg on admission.   -Patient completed a 6-day course of IV cefepime and no further antibiotics are  needed on discharge.   -Due to concerns for volume overload 2D echo was obtained with a EF of 60 to 65%, no wall motion abnormalities, grade 1 diastolic dysfunction.  -Patient subsequently transitioned from IV  Lasix to oral Lasix 20 mg daily on discharge.  -Patient's hypoxia improved and was satting 90%-94% on room air by day of discharge.  -Outpatient follow-up with PCP/oncology.  Patient may benefit from outpatient referral to cardiology.   2.  CLL Elevated leukocytosis secondary to CLL and treatment per oncology.  Treatment held during the hospitalization and will be resumed post discharge.  Patient followed by oncology during the hospitalization will follow up in the outpatient setting.   3.  Acquired pancytopenia/thrombocytopenia/normocytic anemia Likely secondary to chemo treatment.  Patient with no overt bleeding.  Hemoglobin stabilized at 9.4 and platelet count of 138 by day of discharge.  Patient was followed by oncology throughout the hospitalization and will be followed up in the outpatient setting.   4.  Chronic kidney disease stage IIIb Stable throughout the hospitalization.  Outpatient follow-up.  5.  Hyperkalemia Resolved.    ARB held and will be resumed on discharge.   6.  Hypertension Patient maintained on home regimen of zebeta, Norvasc and HCTZ.  ARB was held.  HCTZ subsequently discontinued as patient was started on IV Lasix due to concerns for volume overload.  Patient be discharged home on Zebeta, Norvasc, ARB and Lasix.  Outpatient follow-up.    Procedures:  CT angiogram chest 12/03/2020  2D echo 12/08/2020    Consultations:  Oncology: Dr. Alvy Bimler    Discharge Exam: Vitals:   12/09/20 0755 12/09/20 1352  BP:  119/66  Pulse:  65  Resp:  18  Temp:    SpO2: 92% (!) 89%    General: NAD Cardiovascular: RRR Respiratory: Some scattered coarse breath sounds.  Discharge Instructions   Discharge Instructions    Diet - low sodium heart healthy   Complete by: As directed    Increase activity slowly   Complete by: As directed      Allergies as of 12/09/2020      Reactions   Azithromycin Hives   Hives and fever   Ibuprofen Other (See Comments),  Swelling   Hives      Medication List    STOP taking these medications   hydrochlorothiazide 25 MG tablet Commonly known as: HYDRODIURIL     TAKE these medications   acalabrutinib 100 MG capsule Commonly known as: CALQUENCE Take 1 capsule (100 mg total) by mouth 2 (two) times daily. Start taking on: December 10, 2020   acetaminophen 325 MG tablet Commonly known as: TYLENOL Take 650 mg by mouth every 6 (six) hours as needed for mild pain.   acyclovir 400 MG tablet Commonly known as: ZOVIRAX Take 1 tablet (400 mg total) by mouth daily.   albuterol 108 (90 Base) MCG/ACT inhaler Commonly known as: VENTOLIN HFA Inhale 2 puffs into the lungs in the morning and at bedtime.   allopurinol 300 MG tablet Commonly known as: Zyloprim Take 1 tablet (300 mg total) by mouth daily.   amLODipine 10 MG tablet Commonly known as: NORVASC TAKE 1 TABLET (10 MG TOTAL) BY MOUTH DAILY.   Azelastine HCl 0.15 % Soln Place 2 sprays into both nostrils daily.   bisoprolol 10 MG tablet Commonly known as: ZEBETA Take 10 mg by mouth daily.   citalopram 20 MG tablet Commonly known as: CELEXA Take 20 mg by mouth every morning.   fluticasone-salmeterol 115-21  MCG/ACT inhaler Commonly known as: ADVAIR HFA Inhale 2 puffs into the lungs 2 (two) times daily.   furosemide 20 MG tablet Commonly known as: LASIX Take 1 tablet (20 mg total) by mouth daily.   gabapentin 300 MG capsule Commonly known as: NEURONTIN Take 300 mg by mouth at bedtime.   guaiFENesin 600 MG 12 hr tablet Commonly known as: MUCINEX Take 2 tablets (1,200 mg total) by mouth 2 (two) times daily for 5 days.   levocetirizine 5 MG tablet Commonly known as: XYZAL Take 5 mg by mouth every evening.   lidocaine-prilocaine cream Commonly known as: EMLA Apply to affected area once What changed:   how much to take  how to take this  when to take this  reasons to take this  additional instructions   losartan 100 MG  tablet Commonly known as: COZAAR Take 100 mg by mouth daily.   ondansetron 8 MG tablet Commonly known as: Zofran Take 1 tablet (8 mg total) by mouth 2 (two) times daily. Start second day after chemotherapy. Then take as needed for nausea or vomiting. What changed:   when to take this  reasons to take this  additional instructions   pantoprazole 40 MG tablet Commonly known as: PROTONIX Take 1 tablet (40 mg total) by mouth daily. Start taking on: December 10, 2020   PreserVision AREDS 2 Caps Take 1 capsule by mouth 2 (two) times daily.   sulfamethoxazole-trimethoprim 800-160 MG tablet Commonly known as: BACTRIM DS Take 1 tablet by mouth 3 (three) times a week.   Vitamin D (Ergocalciferol) 50 MCG (2000 UT) Caps Take 2,000 Units by mouth daily.      Allergies  Allergen Reactions  . Azithromycin Hives    Hives and fever  . Ibuprofen Other (See Comments) and Swelling    Hives    Follow-up Information    Heath Lark, MD Follow up.   Specialty: Hematology and Oncology Why: f/u as scheduled. Contact information: [place] [address] [phone]        Jonathon Jordan, MD. Schedule an appointment as soon as possible for a visit in 2 week(s).   Specialty: Family Medicine Contact information: [place] [address] [phone]                The results of significant diagnostics from this hospitalization (including imaging, microbiology, ancillary and laboratory) are listed below for reference.    Significant Diagnostic Studies: DG Chest 2 View  Result Date: 12/07/2020 CLINICAL DATA:  75 year old female with a history of CLL and hypoxia EXAM: CHEST - 2 VIEW COMPARISON:  Chest CT 12/03/2020, plain film 11/11/2020 FINDINGS: Cardiomediastinal silhouette unchanged in size and contour. Rounded opacity in the right hilar region, corresponding to adenopathy on the contemporaneous chest CT. Pattern of  reticulonodular opacities of the bilateral lungs, corresponding to findings on prior CT. On the lateral view there is partial volume loss/consolidation of the right middle lobe, present on the CT. Minimal blunting of the right costophrenic angle. No pneumothorax. Right IJ port catheter. IMPRESSION: Reticulonodular opacities, compatible with multifocal infection and present on recent CT. As was noted, pulmonary lymphoma considered less likely. Partial volume loss/consolidation of the right middle lobe, present on comparison CT. Trace right-sided pleural fluid. Unchanged right port catheter. Electronically Signed   By: Corrie Mckusick D.O.   On: 12/07/2020 15:25   DG Chest 2 View  Result Date: 11/11/2020 CLINICAL DATA:  75 year old female with cough. Chronic lymphocytic  leukemia. EXAM: CHEST - 2 VIEW COMPARISON:  Chest radiograph dated 03/24/2018 FINDINGS: Port-A-Cath in similar position. Bilateral streaky and nodular densities most consistent with developing infiltrate, likely viral or atypical in etiology including COVID-19. Clinical correlation is recommended. The cardiac silhouette is within limits. Atherosclerotic calcification of the aorta. Right hilar mass/adenopathy measuring approximately 5 cm in craniocaudal length. No acute osseous pathology. Degenerative changes of the spine. IMPRESSION: 1. Findings most consistent with developing infiltrate. 2. Right hilar mass/adenopathy in keeping with history of CLL. CT may provide better evaluation if clinically indicated. Electronically Signed   By: Anner Crete M.D.   On: 11/11/2020 20:52   CT Angio Chest PE W and/or Wo Contrast  Result Date: 12/03/2020 CLINICAL DATA:  Concern for pulmonary embolism. History of chronic lymphocytic leukemia. EXAM: CT ANGIOGRAPHY CHEST WITH CONTRAST TECHNIQUE: Multidetector CT imaging of the chest was performed using the standard protocol during bolus administration of intravenous contrast. Multiplanar CT image  reconstructions and MIPs were obtained to evaluate the vascular anatomy. CONTRAST:  89mL OMNIPAQUE IOHEXOL 350 MG/ML SOLN COMPARISON:  CT 08/11/2020 FINDINGS: Cardiovascular: No filling defects within the pulmonary arteries to suggest acute pulmonary embolism. Mediastinum/Nodes: Bulky mediastinal perihilar lymphadenopathy again noted. For example lymph node along the RIGHT bronchus intermedius measuring 25 mm compares to 25 mm. RIGHT lower paratracheal node measuring 18 mm compares to 15 mm. Lungs/Pleura: There is patchy bilateral nodular airspace disease which is worsened from comparison exam. Consolidation and peripheral nodularity in the posterior RIGHT upper lobe on image 65/6 is worsened. There is a consolidation at the LEFT lung base measuring 3.4 cm (image 88/6) which is new. There is atelectasis in the RIGHT middle lobe slightly increased. There is a RIGHT pleural effusion which is new. W Ithin the lung apices there is patchy nodular airspace disease also worsened from comparison exam. Upper Abdomen: A periaortic retroperitoneal adenopathy similar to prior. Spleen appears enlarged. Musculoskeletal: No aggressive osseous lesion Review of the MIP images confirms the above findings. IMPRESSION: 1. No acute pulmonary embolism. 2. New bilateral patchy, nodular and consolidative airspace disease most consistent multifocal pneumonia including atypical pneumonia. Secondary differential would include a pulmonary lymphoma, less favored. 3. Bulky mediastinal and axillary lymphadenopathy related to CLL. No change from prior. 4. Splenomegaly and upper abdominal adenopathy related to CLL. Electronically Signed   By: Suzy Bouchard M.D.   On: 12/03/2020 13:41   MM DIAG BREAST TOMO UNI LEFT  Result Date: 11/10/2020 CLINICAL DATA:  Callback from screening mammogram for possible asymmetry left breast EXAM: DIGITAL DIAGNOSTIC UNILATERAL LEFT MAMMOGRAM WITH TOMO AND CAD TECHNIQUE: Left digital diagnostic mammography and  breast tomosynthesis was performed. Digital images of the breasts were evaluated with computer-aided detection. COMPARISON:  Prior films ACR Breast Density Category b: There are scattered areas of fibroglandular density. FINDINGS: Cc and MLO views of the left breast are submitted. Previously questioned asymmetry in the medial left breast does not persist on additional views. No suspicious abnormality is identified. IMPRESSION: Benign findings. RECOMMENDATION: Routine screening mammogram back on schedule. I have discussed the findings and recommendations with the patient. If applicable, a reminder letter will be sent to the patient regarding the next appointment. BI-RADS CATEGORY  2: Benign. Electronically Signed   By: Abelardo Diesel M.D.   On: 11/10/2020 10:27   ECHOCARDIOGRAM COMPLETE  Result Date: 12/08/2020    ECHOCARDIOGRAM REPORT   Patient Name:   Erin Moore Date of Exam: 12/08/2020 Medical Rec #:  [id number]      Height:  62.0 in Accession #:    [id number]     Weight:       214.6 lb Date of Birth:  [birth year]      BSA:          1.970 m Patient Age:    75 years       BP:           132/59 mmHg Patient Gender: F              HR:           66 bpm. Exam Location:  Inpatient Procedure: 2D Echo Indications:    Dyspnea R06.00  History:        Patient has no prior history of Echocardiogram examinations.                 Risk Factors:Hypertension and Dyslipidemia.  Sonographer:    Mikki Santee RDCS (AE) Referring Phys: [place]  1. Left ventricular ejection fraction, by estimation, is 60 to 65%. The left ventricle has normal function. The left ventricle has no regional wall motion abnormalities. Left ventricular diastolic parameters are consistent with Grade I diastolic dysfunction (impaired relaxation).  2. Right ventricular systolic function is normal. The right ventricular size is normal.  3. Mild mitral valve regurgitation  4. inferior vena cava is normal in size with greater  than 50% respiratory variability, suggesting right atrial pressure of 3 mmHg. FINDINGS  Left Ventricle: Left ventricular ejection fraction, by estimation, is 60 to 65%. The left ventricle has normal function. The left ventricle has no regional wall motion abnormalities. The left ventricular internal cavity size was normal in size. There is  no left ventricular hypertrophy. Left ventricular diastolic parameters are consistent with Grade I diastolic dysfunction (impaired relaxation). Right Ventricle: The right ventricular size is normal. Right vetricular wall thickness was not assessed. Right ventricular systolic function is normal. Left Atrium: Left atrial size was normal in size. Right Atrium: Right atrial size was normal in size. Pericardium: There is no evidence of pericardial effusion. Mitral Valve: The mitral valve is abnormal. There is mild thickening of the mitral valve leaflet(s). Mild mitral valve regurgitation. Tricuspid Valve: The tricuspid valve is normal in structure. Tricuspid valve regurgitation is trivial. Aortic Valve: The aortic valve is tricuspid. Aortic valve regurgitation is not visualized. Mild aortic valve sclerosis is present, with no evidence of aortic valve stenosis. Pulmonic Valve: The pulmonic valve was not well visualized. Pulmonic valve regurgitation is not visualized. Aorta: The aortic root is normal in size and structure. Venous: The inferior vena cava is normal in size with greater than 50% respiratory variability, suggesting right atrial pressure of 3 mmHg. IAS/Shunts: The interatrial septum was not assessed.  LEFT VENTRICLE PLAX 2D LVIDd:         5.00 cm  Diastology LVIDs:         3.10 cm  LV e' medial:    5.55 cm/s LV PW:         1.00 cm  LV E/e' medial:  15.6 LV IVS:        0.90 cm  LV e' lateral:   7.51 cm/s LVOT diam:     2.00 cm  LV E/e' lateral: 11.5 LV SV:         88 LV SV Index:   45 LVOT Area:     3.14 cm  RIGHT VENTRICLE RV S prime:     16.30 cm/s TAPSE (M-mode): 2.6 cm  LEFT ATRIUM  Index       RIGHT ATRIUM           Index LA diam:        3.80 cm 1.93 cm/m  RA Area:     23.40 cm LA Vol (A2C):   43.0 ml 21.83 ml/m RA Volume:   74.60 ml  37.87 ml/m LA Vol (A4C):   57.2 ml 29.04 ml/m LA Biplane Vol: 50.9 ml 25.84 ml/m  AORTIC VALVE LVOT Vmax:   126.00 cm/s LVOT Vmean:  85.800 cm/s LVOT VTI:    0.280 m  AORTA Ao Root diam: 2.80 cm MITRAL VALVE MV Area (PHT): 3.27 cm     SHUNTS MV Decel Time: 232 msec     Systemic VTI:  0.28 m MV E velocity: 86.60 cm/s   Systemic Diam: 2.00 cm MV A velocity: 114.00 cm/s MV E/A ratio:  0.76 Dorris Carnes MD Electronically signed by Dorris Carnes MD Signature Date/Time: 12/08/2020/4:17:05 PM    Final     Microbiology: Recent Results (from the past 240 hour(s))  Blood Culture (routine x 2)     Status: None   Collection Time: 12/03/20 11:02 AM   Specimen: BLOOD  Result Value Ref Range Status   Specimen Description   Final    BLOOD SITE NOT SPECIFIED Performed at [facility], [address], [place], [place] [address]    Special Requests   Final    BOTTLES DRAWN AEROBIC AND ANAEROBIC Blood Culture adequate volume Performed at [facility] [address] [place], [place] [address]    Culture   Final    NO GROWTH 5 DAYS Performed at [facility], [place] [address] [place], [place] [address]    Report Status 12/08/2020 FINAL  Final  Blood Culture (routine x 2)     Status: None   Collection Time: 12/03/20 11:33 AM   Specimen: BLOOD  Result Value Ref Range Status   Specimen Description   Final    BLOOD RIGHT ANTECUBITAL Performed at [facility] [address] [place], [place] [address]    Special Requests   Final    BOTTLES DRAWN AEROBIC AND ANAEROBIC Blood Culture adequate volume Performed at [facility] [address] [place], [place] [address]    Culture   Final    NO GROWTH 5 DAYS Performed at [facility], [place] [address],  [place], [place] [address]    Report Status 12/08/2020 FINAL  Final  Urine culture     Status: None   Collection Time: 12/03/20 12:34 PM   Specimen: In/Out Cath Urine  Result Value Ref Range Status   Specimen Description   Final    IN/OUT CATH URINE Performed at [facility] [address], [place], [place] [address]    Special Requests   Final    NONE Performed at [facility], [place] [address], [place], [place] [address]    Culture   Final    NO GROWTH Performed at [facility], [place] [address], [place],  [address]    Report Status 12/04/2020 FINAL  Final  Resp Panel by RT-PCR (Flu A&B, Covid) Nasopharyngeal Swab     Status: None   Collection Time: 12/03/20  3:20 PM   Specimen: Nasopharyngeal Swab; Nasopharyngeal(NP) swabs in vial transport medium  Result Value Ref Range Status   SARS Coronavirus 2 by RT PCR NEGATIVE NEGATIVE Final    Comment: (NOTE)  SARS-CoV-2 target nucleic acids are NOT DETECTED.  The SARS-CoV-2 RNA is generally detectable in upper respiratory specimens during the acute phase of infection. The lowest concentration of SARS-CoV-2 viral copies this assay can detect is 138 copies/mL. A negative result does not preclude SARS-Cov-2 infection and should not be used as the sole basis for treatment or other patient management decisions. A negative result may occur with  improper specimen collection/handling, submission of specimen other than nasopharyngeal swab, presence of viral mutation(s) within the areas targeted by this assay, and inadequate number of viral copies(<138 copies/mL). A negative result must be combined with clinical observations, patient history, and epidemiological information. The expected result is Negative.  Fact Sheet for Patients:  EntrepreneurPulse.com.au  Fact Sheet for Healthcare Providers:  IncredibleEmployment.be  This test is no t yet approved or  cleared by the [place] FDA and  has been authorized for detection and/or diagnosis of SARS-CoV-2 by FDA under an Emergency Use Authorization (EUA). This EUA will remain  in effect (meaning this test can be used) for the duration of the COVID-19 declaration under Section 564(b)(1) of the Act, 21 U.S.C.section 360bbb-3(b)(1), unless the authorization is terminated  or revoked sooner.       Influenza A by PCR NEGATIVE NEGATIVE Final   Influenza B by PCR NEGATIVE NEGATIVE Final    Comment: (NOTE) The Xpert Xpress SARS-CoV-2/FLU/RSV plus assay is intended as an aid in the diagnosis of influenza from Nasopharyngeal swab specimens and should not be used as a sole basis for treatment. Nasal washings and aspirates are unacceptable for Xpert Xpress SARS-CoV-2/FLU/RSV testing.  Fact Sheet for Patients: EntrepreneurPulse.com.au  Fact Sheet for Healthcare Providers: IncredibleEmployment.be  This test is not yet approved or cleared by the [place] FDA and has been authorized for detection and/or diagnosis of SARS-CoV-2 by FDA under an Emergency Use Authorization (EUA). This EUA will remain in effect (meaning this test can be used) for the duration of the COVID-19 declaration under Section 564(b)(1) of the Act, 21 U.S.C. section 360bbb-3(b)(1), unless the authorization is terminated or revoked.  Performed at [facility], [place] [address], [place], [place] [address]   MRSA PCR Screening     Status: None   Collection Time: 12/04/20 12:00 AM   Specimen: Nasopharyngeal  Result Value Ref Range Status   MRSA by PCR NEGATIVE NEGATIVE Final    Comment:        The GeneXpert MRSA Assay (FDA approved for NASAL specimens only), is one component of a comprehensive MRSA colonization surveillance program. It is not intended to diagnose MRSA infection nor to guide or monitor treatment for MRSA infections. Performed at [facility], [place] [address] [place], [place] [address]   Culture, sputum-assessment     Status: None   Collection Time: 12/04/20  5:20 AM   Specimen: Expectorated Sputum  Result Value Ref Range Status   Specimen Description EXPECTORATED SPUTUM  Final   Special Requests Immunocompromised  Final   Sputum evaluation   Final    THIS SPECIMEN IS ACCEPTABLE FOR SPUTUM CULTURE Performed at [facility], [place] [address] [place], [place] [address]    Report Status 12/04/2020 FINAL  Final  Culture, Respiratory w Gram Stain     Status: None   Collection Time: 12/04/20  5:20 AM  Result Value Ref Range Status   Specimen Description   Final    EXPECTORATED SPUTUM Performed at [facility] [address] [place], [place] [address]  Special Requests   Final    Immunocompromised Reflexed from [id number] Performed at [facility], [place] [address] [place], [place] [address]    Gram Stain   Final    FEW WBC PRESENT, PREDOMINANTLY PMN RARE GRAM POSITIVE COCCI RARE GRAM NEGATIVE RODS    Culture   Final    FEW Normal respiratory flora-no Staph aureus or Pseudomonas seen Performed at [facility] Lab, [facility] [address], [place], [place] [address]    Report Status 12/06/2020 FINAL  Final  Resp Panel by RT-PCR (Flu A&B, Covid) Nasopharyngeal Swab     Status: None   Collection Time: 12/09/20 11:58 AM   Specimen: Nasopharyngeal Swab; Nasopharyngeal(NP) swabs in vial transport medium  Result Value Ref Range Status   SARS Coronavirus 2 by RT PCR NEGATIVE NEGATIVE Final    Comment: (NOTE) SARS-CoV-2 target nucleic acids are NOT DETECTED.  The SARS-CoV-2 RNA is generally detectable in upper respiratory specimens during the acute phase of infection. The lowest concentration of SARS-CoV-2 viral copies this assay can detect is 138 copies/mL. A negative result does not preclude SARS-Cov-2 infection and should not be used as the sole basis for  treatment or other patient management decisions. A negative result may occur with  improper specimen collection/handling, submission of specimen other than nasopharyngeal swab, presence of viral mutation(s) within the areas targeted by this assay, and inadequate number of viral copies(<138 copies/mL). A negative result must be combined with clinical observations, patient history, and epidemiological information. The expected result is Negative.  Fact Sheet for Patients:  EntrepreneurPulse.com.au  Fact Sheet for Healthcare Providers:  IncredibleEmployment.be  This test is no t yet approved or cleared by the [place] FDA and  has been authorized for detection and/or diagnosis of SARS-CoV-2 by FDA under an Emergency Use Authorization (EUA). This EUA will remain  in effect (meaning this test can be used) for the duration of the COVID-19 declaration under Section 564(b)(1) of the Act, 21 U.S.C.section 360bbb-3(b)(1), unless the authorization is terminated  or revoked sooner.       Influenza A by PCR NEGATIVE NEGATIVE Final   Influenza B by PCR NEGATIVE NEGATIVE Final    Comment: (NOTE) The Xpert Xpress SARS-CoV-2/FLU/RSV plus assay is intended as an aid in the diagnosis of influenza from Nasopharyngeal swab specimens and should not be used as a sole basis for treatment. Nasal washings and aspirates are unacceptable for Xpert Xpress SARS-CoV-2/FLU/RSV testing.  Fact Sheet for Patients: EntrepreneurPulse.com.au  Fact Sheet for Healthcare Providers: IncredibleEmployment.be  This test is not yet approved or cleared by the [place] FDA and has been authorized for detection and/or diagnosis of SARS-CoV-2 by FDA under an Emergency Use Authorization (EUA). This EUA will remain in effect (meaning this test can be used) for the duration of the COVID-19 declaration under Section 564(b)(1) of the Act, 21  U.S.C. section 360bbb-3(b)(1), unless the authorization is terminated or revoked.  Performed at [facility], [place] [address], [place], [place] [address]      Labs: Basic Metabolic Panel: Recent Labs  Lab 12/05/20 0310 12/06/20 0400 12/07/20 0306 12/08/20 0310 12/09/20 0335  NA 142 139 138 139 141  K 4.1 4.0 4.5 3.8 3.3*  CL 106 104 102 98 99  CO2 27 27 28 30  32  GLUCOSE 127* 96 107* 97 107*  BUN 32* 35* 36* 42* 43*  CREATININE 1.11* 1.06* 0.97 1.08* 1.19*  CALCIUM 8.7* 8.9 8.8* 9.0 8.9  MG 2.0 2.1 2.1 2.1  2.3   Liver Function Tests: Recent Labs  Lab 12/03/20 0950 12/04/20 0313  AST 12* 13*  ALT 14 16  ALKPHOS 117 102  BILITOT 0.5 0.9  PROT 5.9* 5.9*  ALBUMIN 3.6 3.8   No results for input(s): LIPASE, AMYLASE in the last 168 hours. No results for input(s): AMMONIA in the last 168 hours. CBC: Recent Labs  Lab 12/03/20 0950 12/03/20 2034 12/05/20 0310 12/06/20 0400 12/07/20 0306 12/08/20 0310 12/09/20 0335  WBC 155.4*   < > 136.8* 134.2* 145.1* 174.8* 187.2*  NEUTROABS 7.8*  --  13.7*  --  6.5 6.2 6.7  HGB 10.3*   < > 8.7* 8.8* 9.2* 9.8* 9.4*  HCT 35.5*   < > 31.0* 31.1* 32.2* 34.4* 34.1*  MCV 95.2   < > 97.8 99.0 97.6 97.5 98.6  PLT 133*   < > 126* 123* 125* 137* 138*   < > = values in this interval not displayed.   Cardiac Enzymes: No results for input(s): CKTOTAL, CKMB, CKMBINDEX, TROPONINI in the last 168 hours. BNP: BNP (last 3 results) Recent Labs    12/07/20 0801  BNP 119.9*    ProBNP (last 3 results) No results for input(s): PROBNP in the last 8760 hours.  CBG: No results for input(s): GLUCAP in the last 168 hours.     Signed:  Irine Seal MD.  [facility] 12/09/2020, 4:04 PM

## 2020-12-09 NOTE — Progress Notes (Signed)
Pharmacy Antibiotic Note  Erin Moore is a 75 y.o. 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 Chest CTA on 2/16 came back negative for PE, but showed findings consistent with multifocal PNA.  Pharmacy has been consulted to dose vancomycin and cefepime for PNA.  Today, 12/09/2020 Day #7 abx - Afebrile - PCT < 0.1 2/21 - WBC elevated (CLL) - SCr 1.19, CrCl ~45 ml/min - Cx neg Final  Plan: - continue cefepime 2gm IV q12h ? DC abx tonight for 7 day course __________________________________  Temp (24hrs), Avg:98.1 F (36.7 C), Min:98 F (36.7 C), Max:98.2 F (36.8 C)  Recent Labs  Lab 12/03/20 1101 12/03/20 2034 12/04/20 0313 12/05/20 0310 12/06/20 0400 12/07/20 0306 12/08/20 0310 12/09/20 0335  WBC  --  108.5*   < > 136.8* 134.2* 145.1* 174.8* 187.2*  CREATININE  --  1.78*   < > 1.11* 1.06* 0.97 1.08* 1.19*  LATICACIDVEN 0.6 0.6  --   --   --   --   --   --    < > = values in this interval not displayed.    Estimated Creatinine Clearance: 45.1 mL/min (A) (by C-G formula based on SCr of 1.19 mg/dL (H)).    Allergies  Allergen Reactions  . Azithromycin Hives    Hives and fever  . Ibuprofen Other (See Comments) and Swelling    Hives   2/16 doxy x1 2/16 CTX x1 2/16 vanc>>2/17 2/16 cefepime>>  2/16 Covid: neg; Influenza A/B: neg/neg 2/16 ucx: NGF 2/16 bcx x2: ngF 2/16 MRSA PCR:  neg 2/17 sputum: few normal flora FINAL 2/16 legionella neg 2/16 strep pneumo neg   Thank you for allowing pharmacy to be a part of this patient's care.  Eudelia Bunch, Pharm.D 12/09/2020 11:45 AM Pharmacy: [phone] 12/09/2020 11:43 AM

## 2020-12-09 NOTE — NC FL2 (Signed)
[place] LEVEL OF CARE SCREENING TOOL     IDENTIFICATION  Patient Name: Erin Moore Birthdate: 11-Oct-1946 Sex: female Admission Date (Current Location): 12/03/2020  [facility] and [place] Number:  Herbalist and Address:  [facility],  [place], [place]      Provider Number: [id number]  Attending Physician Name and Address:  Eugenie Filler, MD  Relative Name and Phone Number:       Current Level of Care: Hospital Recommended Level of Care: [place] Prior Approval Number:    Date Approved/Denied:   PASRR Number:    Discharge Plan: Other (Comment) (ALF)    Current Diagnoses: Patient Active Problem List   Diagnosis Date Noted  . Acute diastolic CHF (congestive heart failure) ([place])   . Hypervolemia   . Acute respiratory failure with hypoxia ([place]) 12/04/2020  . Hyperkalemia 12/04/2020  . Pneumonia 12/03/2020  . Multifocal pneumonia 12/03/2020  . Bilateral interstitial pneumonia ([place]) 08/12/2020  . Left upper lobe pneumonia 04/29/2020  . CKD (chronic kidney disease), symptom management only, stage 3 (moderate) ([place]) 03/04/2020  . Peripheral neuropathy 02/14/2020  . Vitamin B12 deficiency 03/01/2019  . Splenomegaly 05/10/2018  . Cough 03/24/2018  . Fall at home, initial encounter 02/21/2018  . Hyperuricemia 01/02/2018  . Goals of care, counseling/discussion 01/02/2018  . Pancytopenia, acquired ([place]) 12/22/2017  . Mental disability   . Thrombocytopenia, unspecified ([place]) 07/18/2013  . Other fatigue 07/18/2013  . Essential hypertension 01/20/2012  . Obesity, Class III, BMI 40-49.9 (morbid obesity) ([place]) 12/13/2011  . CLL (chronic lymphocytic leukemia) ([place]) 12/07/2011    Orientation RESPIRATION BLADDER Height & Weight     Self,Time,Situation,Place  Normal Continent Weight: 213 lb 14.4 oz (97 kg) Height:  5\' 2"  (157.5 cm)  BEHAVIORAL SYMPTOMS/MOOD NEUROLOGICAL BOWEL NUTRITION STATUS       Continent Diet (reg)  AMBULATORY STATUS COMMUNICATION OF NEEDS Skin   Supervision Verbally Normal                       Personal Care Assistance Level of Assistance  Bathing Bathing Assistance:  (supervision)         Functional Limitations Info             SPECIAL CARE FACTORS FREQUENCY                       Contractures Contractures Info: Not present    Additional Factors Info  Code Status,Allergies Code Status Info: Full Allergies Info: see MAR           Current Medications (12/09/2020):  This is the current hospital active medication list Current Facility-Administered Medications  Medication Dose Route Frequency Provider Last Rate Last Admin  . acetaminophen (TYLENOL) tablet 650 mg  650 mg Oral Q6H PRN Marylyn Ishihara, Tyrone A, DO   650 mg at 12/08/20 2115  . acyclovir (ZOVIRAX) tablet 400 mg  400 mg Oral Daily Kyle, Tyrone A, DO   400 mg at 12/09/20 1037  . allopurinol (ZYLOPRIM) tablet 300 mg  300 mg Oral Daily Kyle, Tyrone A, DO   300 mg at 12/09/20 1036  . amLODipine (NORVASC) tablet 10 mg  10 mg Oral Daily Kyle, Tyrone A, DO   10 mg at 12/09/20 1036  . azelastine (ASTELIN) 0.1 % nasal spray 2 spray  2 spray Each Nare Daily Kyle, Tyrone A, DO   2 spray at 12/09/20 1035  . bisoprolol (ZEBETA) tablet  10 mg  10 mg Oral Daily Kyle, Tyrone A, DO   10 mg at 12/09/20 1036  . ceFEPIme (MAXIPIME) 2 g in sodium chloride 0.9 % 100 mL IVPB  2 g Intravenous Q12H Pham, Anh P, RPH 200 mL/hr at 12/09/20 0546 2 g at 12/09/20 0546  . Chlorhexidine Gluconate Cloth 2 % PADS 6 each  6 each Topical Daily Kyle, Tyrone A, DO   6 each at 12/09/20 1037  . cholecalciferol (VITAMIN D3) tablet 2,000 Units  2,000 Units Oral Daily Kyle, Tyrone A, DO   2,000 Units at 12/09/20 1036  . citalopram (CELEXA) tablet 20 mg  20 mg Oral q morning Kyle, Tyrone A, DO   20 mg at 12/09/20 1042  . enoxaparin (LOVENOX) injection 40 mg  40 mg Subcutaneous Q24H Kyle, Tyrone A, DO   40 mg at 12/08/20 2116  .  furosemide (LASIX) tablet 20 mg  20 mg Oral Daily Eugenie Filler, MD   20 mg at 12/09/20 1522  . gabapentin (NEURONTIN) capsule 300 mg  300 mg Oral QHS Kyle, Tyrone A, DO   300 mg at 12/08/20 2115  . guaiFENesin (MUCINEX) 12 hr tablet 1,200 mg  1,200 mg Oral BID Eugenie Filler, MD   1,200 mg at 12/09/20 1035  . hydrochlorothiazide (HYDRODIURIL) tablet 25 mg  25 mg Oral Daily Eugenie Filler, MD   25 mg at 12/09/20 1037  . HYDROcodone-homatropine (HYCODAN) 5-1.5 MG/5ML syrup 5 mL  5 mL Oral Q6H PRN Eugenie Filler, MD   5 mL at 12/06/20 0247  . ipratropium-albuterol (DUONEB) 0.5-2.5 (3) MG/3ML nebulizer solution 3 mL  3 mL Nebulization Q6H PRN Eugenie Filler, MD      . lidocaine-prilocaine (EMLA) cream 1 application  1 application Topical Daily PRN Marylyn Ishihara, Tyrone A, DO      . loratadine (CLARITIN) tablet 10 mg  10 mg Oral QHS Kyle, Tyrone A, DO   10 mg at 12/08/20 2115  . mometasone-formoterol (DULERA) 200-5 MCG/ACT inhaler 2 puff  2 puff Inhalation BID Marylyn Ishihara, Tyrone A, DO   2 puff at 12/09/20 0755  . multivitamin (PROSIGHT) tablet 1 tablet  1 tablet Oral BID Marylyn Ishihara, Tyrone A, DO   1 tablet at 12/09/20 1036  . ondansetron (ZOFRAN) tablet 8 mg  8 mg Oral Q8H PRN Marylyn Ishihara, Tyrone A, DO      . pantoprazole (PROTONIX) EC tablet 40 mg  40 mg Oral Daily Eugenie Filler, MD   40 mg at 12/09/20 1038  . predniSONE (DELTASONE) tablet 20 mg  20 mg Oral Q breakfast Eugenie Filler, MD   20 mg at 12/09/20 0802  . sodium chloride flush (NS) 0.9 % injection 10-40 mL  10-40 mL Intracatheter PRN Cherylann Ratel A, DO         Discharge Medications: Please see discharge summary for a list of discharge medications.  Relevant Imaging Results:  Relevant Lab Results:   Additional Information    HOYLE, LUCY, LCSW

## 2020-12-09 NOTE — Progress Notes (Signed)
Erin Moore   DOB:02/17/1946   ZC#:[id number]    ASSESSMENT & PLAN:  CLL (chronic lymphocytic leukemia) ([place]) Her extreme leukocytosis is due to her CLL and treatment Currently admitted for pneumonia Acalabrutinib has been placed on hold, can resume tomorrow once she is discharged  Bilateral interstitial pneumonia ([place]) Even though her CT imaging showed concerning changes for pneumonia, she got better with diuresis She has completed a course of antibiotics She does not need to continue on antibiotics upon discharge  Elevated BNP, good urine output with diuresis She likely have component of heart failure I agree with Dr. Biagio Borg assessment and work-up for CHF Echocardiogram showed no signs of cardiomyopathy I agree with home Lasix therapy I will consider cardiology consult when I see her next week  Pancytopenia, acquired ([place]) She has persistent pancytopenia due to treatment She also have B12 deficiency and has received aggressive vitamin B12 supplement Observe closely for now  Chronic kidney disease stage III Monitor closely  Goals of care, counseling/discussion Due to the patient's mild baseline mental deficit, her sister/HCPOAmakes all her medical decision I have updated her sister today  Discharge Planning Hopefully, she can be discharged today If her oxygen saturation is adequate on room air, she might not need home oxygen I have set up outpatient appointment on March 2 I will sign off   Heath Lark, MD 12/09/2020 9:24 AM  Subjective: She is doing well.  She has minimum coughing.  She denies shortness of breath or dizziness  Objective:  Vitals:   12/09/20 0544 12/09/20 0755  BP: (!) 128/58   Pulse: (!) 59   Resp: 16   Temp: 98.2 F (36.8 C)   SpO2: 90% 92%     Intake/Output Summary (Last 24 hours) at 12/09/2020 0924 Last data filed at 12/09/2020 1287 Gross per 24 hour  Intake 1248.27 ml  Output 3800 ml  Net -2551.73 ml    GENERAL:alert, no  distress and comfortable SKIN: skin color, texture, turgor are normal, no rashes or significant lesions EYES: normal, Conjunctiva are pink and non-injected, sclera clear OROPHARYNX:no exudate, no erythema and lips, buccal mucosa, and tongue normal  NECK: supple, thyroid normal size, non-tender, without nodularity LYMPH:  no palpable lymphadenopathy in the cervical, axillary or inguinal LUNGS: clear to auscultation and percussion with normal breathing effort HEART: regular rate & rhythm and no murmurs and no lower extremity edema ABDOMEN:abdomen soft, non-tender and normal bowel sounds Musculoskeletal:no cyanosis of digits and no clubbing  NEURO: alert & oriented x 3 with fluent speech, no focal motor/sensory deficits   Labs:  Recent Labs    05/05/20 1007 06/09/20 1037 07/21/20 1120 08/25/20 1129 11/11/20 1320 12/03/20 0950 12/03/20 2034 12/04/20 0313 12/05/20 0310 12/07/20 0306 12/08/20 0310 12/09/20 0335  NA 142 140 142   < > 141 142  --  144   < > 138 139 141  K 4.4 3.8 4.2   < > 4.4 4.3  --  5.2*   < > 4.5 3.8 3.3*  CL 103 103 105   < > 106 106  --  108   < > 102 98 99  CO2 26 27 29    < > 26 28  --  28   < > 28 30 32  GLUCOSE 106* 106* 102*   < > 103* 98  --  137*   < > 107* 97 107*  BUN 29* 22 26*   < > 26* 22  --  25*   < >  36* 42* 43*  CREATININE 1.18* 1.29* 1.26*   < > 1.47* 1.55*   < > 1.39*   < > 0.97 1.08* 1.19*  CALCIUM 9.7 9.7 9.5   < > 9.2 9.1  --  9.1   < > 8.8* 9.0 8.9  GFRNONAA 45* 41* 42*   < > 37* 35*   < > 40*   < > >60 54* 48*  GFRAA 53* 47* 49*  --   --   --   --   --   --   --   --   --   PROT 6.3* 5.9* 6.1*   < > 6.0* 5.9*  --  5.9*  --   --   --   --   ALBUMIN 4.3 3.7 3.8   < > 3.9 3.6  --  3.8  --   --   --   --   AST 11* 14* 19   < > 13* 12*  --  13*  --   --   --   --   ALT 12 10 14    < > 8 14  --  16  --   --   --   --   ALKPHOS 79 86 83   < > 95 117  --  102  --   --   --   --   BILITOT 0.4 0.4 0.5   < > 0.5 0.5  --  0.9  --   --   --   --    <  > = values in this interval not displayed.    Studies:  DG Chest 2 View  Result Date: 12/07/2020 CLINICAL DATA:  75 year old female with a history of CLL and hypoxia EXAM: CHEST - 2 VIEW COMPARISON:  Chest CT 12/03/2020, plain film 11/11/2020 FINDINGS: Cardiomediastinal silhouette unchanged in size and contour. Rounded opacity in the right hilar region, corresponding to adenopathy on the contemporaneous chest CT. Pattern of reticulonodular opacities of the bilateral lungs, corresponding to findings on prior CT. On the lateral view there is partial volume loss/consolidation of the right middle lobe, present on the CT. Minimal blunting of the right costophrenic angle. No pneumothorax. Right IJ port catheter. IMPRESSION: Reticulonodular opacities, compatible with multifocal infection and present on recent CT. As was noted, pulmonary lymphoma considered less likely. Partial volume loss/consolidation of the right middle lobe, present on comparison CT. Trace right-sided pleural fluid. Unchanged right port catheter. Electronically Signed   By: Corrie Mckusick D.O.   On: 12/07/2020 15:25   DG Chest 2 View  Result Date: 11/11/2020 CLINICAL DATA:  75 year old female with cough. Chronic lymphocytic leukemia. EXAM: CHEST - 2 VIEW COMPARISON:  Chest radiograph dated 03/24/2018 FINDINGS: Port-A-Cath in similar position. Bilateral streaky and nodular densities most consistent with developing infiltrate, likely viral or atypical in etiology including COVID-19. Clinical correlation is recommended. The cardiac silhouette is within limits. Atherosclerotic calcification of the aorta. Right hilar mass/adenopathy measuring approximately 5 cm in craniocaudal length. No acute osseous pathology. Degenerative changes of the spine. IMPRESSION: 1. Findings most consistent with developing infiltrate. 2. Right hilar mass/adenopathy in keeping with history of CLL. CT may provide better evaluation if clinically indicated. Electronically  Signed   By: Anner Crete M.D.   On: 11/11/2020 20:52   CT Angio Chest PE W and/or Wo Contrast  Result Date: 12/03/2020 CLINICAL DATA:  Concern for pulmonary embolism. History of chronic lymphocytic leukemia. EXAM: CT ANGIOGRAPHY CHEST WITH CONTRAST TECHNIQUE: Multidetector  CT imaging of the chest was performed using the standard protocol during bolus administration of intravenous contrast. Multiplanar CT image reconstructions and MIPs were obtained to evaluate the vascular anatomy. CONTRAST:  54mL OMNIPAQUE IOHEXOL 350 MG/ML SOLN COMPARISON:  CT 08/11/2020 FINDINGS: Cardiovascular: No filling defects within the pulmonary arteries to suggest acute pulmonary embolism. Mediastinum/Nodes: Bulky mediastinal perihilar lymphadenopathy again noted. For example lymph node along the RIGHT bronchus intermedius measuring 25 mm compares to 25 mm. RIGHT lower paratracheal node measuring 18 mm compares to 15 mm. Lungs/Pleura: There is patchy bilateral nodular airspace disease which is worsened from comparison exam. Consolidation and peripheral nodularity in the posterior RIGHT upper lobe on image 65/6 is worsened. There is a consolidation at the LEFT lung base measuring 3.4 cm (image 88/6) which is new. There is atelectasis in the RIGHT middle lobe slightly increased. There is a RIGHT pleural effusion which is new. W Ithin the lung apices there is patchy nodular airspace disease also worsened from comparison exam. Upper Abdomen: A periaortic retroperitoneal adenopathy similar to prior. Spleen appears enlarged. Musculoskeletal: No aggressive osseous lesion Review of the MIP images confirms the above findings. IMPRESSION: 1. No acute pulmonary embolism. 2. New bilateral patchy, nodular and consolidative airspace disease most consistent multifocal pneumonia including atypical pneumonia. Secondary differential would include a pulmonary lymphoma, less favored. 3. Bulky mediastinal and axillary lymphadenopathy related to CLL.  No change from prior. 4. Splenomegaly and upper abdominal adenopathy related to CLL. Electronically Signed   By: Suzy Bouchard M.D.   On: 12/03/2020 13:41   MM DIAG BREAST TOMO UNI LEFT  Result Date: 11/10/2020 CLINICAL DATA:  Callback from screening mammogram for possible asymmetry left breast EXAM: DIGITAL DIAGNOSTIC UNILATERAL LEFT MAMMOGRAM WITH TOMO AND CAD TECHNIQUE: Left digital diagnostic mammography and breast tomosynthesis was performed. Digital images of the breasts were evaluated with computer-aided detection. COMPARISON:  Prior films ACR Breast Density Category b: There are scattered areas of fibroglandular density. FINDINGS: Cc and MLO views of the left breast are submitted. Previously questioned asymmetry in the medial left breast does not persist on additional views. No suspicious abnormality is identified. IMPRESSION: Benign findings. RECOMMENDATION: Routine screening mammogram back on schedule. I have discussed the findings and recommendations with the patient. If applicable, a reminder letter will be sent to the patient regarding the next appointment. BI-RADS CATEGORY  2: Benign. Electronically Signed   By: Abelardo Diesel M.D.   On: 11/10/2020 10:27   ECHOCARDIOGRAM COMPLETE  Result Date: 12/08/2020    ECHOCARDIOGRAM REPORT   Patient Name:   BIRDELL FRASIER Date of Exam: 12/08/2020 Medical Rec #:  [id number]      Height:       62.0 in Accession #:    [id number]     Weight:       214.6 lb Date of Birth:  1946/02/12      BSA:          1.970 m Patient Age:    75 years       BP:           132/59 mmHg Patient Gender: F              HR:           66 bpm. Exam Location:  Inpatient Procedure: 2D Echo Indications:    Dyspnea R06.00  History:        Patient has no prior history of Echocardiogram examinations.  Risk Factors:Hypertension and Dyslipidemia.  Sonographer:    Mikki Santee RDCS (AE) Referring Phys: [place]  1. Left ventricular ejection  fraction, by estimation, is 60 to 65%. The left ventricle has normal function. The left ventricle has no regional wall motion abnormalities. Left ventricular diastolic parameters are consistent with Grade I diastolic dysfunction (impaired relaxation).  2. Right ventricular systolic function is normal. The right ventricular size is normal.  3. Mild mitral valve regurgitation  4. inferior vena cava is normal in size with greater than 50% respiratory variability, suggesting right atrial pressure of 3 mmHg. FINDINGS  Left Ventricle: Left ventricular ejection fraction, by estimation, is 60 to 65%. The left ventricle has normal function. The left ventricle has no regional wall motion abnormalities. The left ventricular internal cavity size was normal in size. There is  no left ventricular hypertrophy. Left ventricular diastolic parameters are consistent with Grade I diastolic dysfunction (impaired relaxation). Right Ventricle: The right ventricular size is normal. Right vetricular wall thickness was not assessed. Right ventricular systolic function is normal. Left Atrium: Left atrial size was normal in size. Right Atrium: Right atrial size was normal in size. Pericardium: There is no evidence of pericardial effusion. Mitral Valve: The mitral valve is abnormal. There is mild thickening of the mitral valve leaflet(s). Mild mitral valve regurgitation. Tricuspid Valve: The tricuspid valve is normal in structure. Tricuspid valve regurgitation is trivial. Aortic Valve: The aortic valve is tricuspid. Aortic valve regurgitation is not visualized. Mild aortic valve sclerosis is present, with no evidence of aortic valve stenosis. Pulmonic Valve: The pulmonic valve was not well visualized. Pulmonic valve regurgitation is not visualized. Aorta: The aortic root is normal in size and structure. Venous: The inferior vena cava is normal in size with greater than 50% respiratory variability, suggesting right atrial pressure of 3 mmHg.  IAS/Shunts: The interatrial septum was not assessed.  LEFT VENTRICLE PLAX 2D LVIDd:         5.00 cm  Diastology LVIDs:         3.10 cm  LV e' medial:    5.55 cm/s LV PW:         1.00 cm  LV E/e' medial:  15.6 LV IVS:        0.90 cm  LV e' lateral:   7.51 cm/s LVOT diam:     2.00 cm  LV E/e' lateral: 11.5 LV SV:         88 LV SV Index:   45 LVOT Area:     3.14 cm  RIGHT VENTRICLE RV S prime:     16.30 cm/s TAPSE (M-mode): 2.6 cm LEFT ATRIUM             Index       RIGHT ATRIUM           Index LA diam:        3.80 cm 1.93 cm/m  RA Area:     23.40 cm LA Vol (A2C):   43.0 ml 21.83 ml/m RA Volume:   74.60 ml  37.87 ml/m LA Vol (A4C):   57.2 ml 29.04 ml/m LA Biplane Vol: 50.9 ml 25.84 ml/m  AORTIC VALVE LVOT Vmax:   126.00 cm/s LVOT Vmean:  85.800 cm/s LVOT VTI:    0.280 m  AORTA Ao Root diam: 2.80 cm MITRAL VALVE MV Area (PHT): 3.27 cm     SHUNTS MV Decel Time: 232 msec     Systemic VTI:  0.28 m MV E velocity:  86.60 cm/s   Systemic Diam: 2.00 cm MV A velocity: 114.00 cm/s MV E/A ratio:  0.76 Dorris Carnes MD Electronically signed by Dorris Carnes MD Signature Date/Time: 12/08/2020/4:17:05 PM    Final

## 2020-12-10 DIAGNOSIS — R2681 Unsteadiness on feet: Secondary | ICD-10-CM | POA: Diagnosis not present

## 2020-12-10 DIAGNOSIS — R262 Difficulty in walking, not elsewhere classified: Secondary | ICD-10-CM | POA: Diagnosis not present

## 2020-12-10 DIAGNOSIS — M6281 Muscle weakness (generalized): Secondary | ICD-10-CM | POA: Diagnosis not present

## 2020-12-15 DIAGNOSIS — R2681 Unsteadiness on feet: Secondary | ICD-10-CM | POA: Diagnosis not present

## 2020-12-15 DIAGNOSIS — R262 Difficulty in walking, not elsewhere classified: Secondary | ICD-10-CM | POA: Diagnosis not present

## 2020-12-15 DIAGNOSIS — M6281 Muscle weakness (generalized): Secondary | ICD-10-CM | POA: Diagnosis not present

## 2020-12-17 ENCOUNTER — Encounter: Payer: Self-pay | Admitting: Hematology and Oncology

## 2020-12-17 ENCOUNTER — Inpatient Hospital Stay: Payer: Medicare Other

## 2020-12-17 ENCOUNTER — Inpatient Hospital Stay: Payer: Medicare Other | Attending: Hematology and Oncology | Admitting: Hematology and Oncology

## 2020-12-17 ENCOUNTER — Other Ambulatory Visit: Payer: Self-pay

## 2020-12-17 DIAGNOSIS — D61818 Other pancytopenia: Secondary | ICD-10-CM | POA: Insufficient documentation

## 2020-12-17 DIAGNOSIS — C9110 Chronic lymphocytic leukemia of B-cell type not having achieved remission: Secondary | ICD-10-CM

## 2020-12-17 DIAGNOSIS — N183 Chronic kidney disease, stage 3 unspecified: Secondary | ICD-10-CM | POA: Diagnosis not present

## 2020-12-17 DIAGNOSIS — I5031 Acute diastolic (congestive) heart failure: Secondary | ICD-10-CM

## 2020-12-17 LAB — CMP (CANCER CENTER ONLY)
ALT: 10 U/L (ref 0–44)
AST: 10 U/L — ABNORMAL LOW (ref 15–41)
Albumin: 3.7 g/dL (ref 3.5–5.0)
Alkaline Phosphatase: 99 U/L (ref 38–126)
Anion gap: 11 (ref 5–15)
BUN: 21 mg/dL (ref 8–23)
CO2: 27 mmol/L (ref 22–32)
Calcium: 9.1 mg/dL (ref 8.9–10.3)
Chloride: 107 mmol/L (ref 98–111)
Creatinine: 1.42 mg/dL — ABNORMAL HIGH (ref 0.44–1.00)
GFR, Estimated: 39 mL/min — ABNORMAL LOW (ref >60)
Glucose, Bld: 91 mg/dL (ref 70–99)
Potassium: 4.1 mmol/L (ref 3.5–5.1)
Sodium: 145 mmol/L (ref 135–145)
Total Bilirubin: 0.4 mg/dL (ref 0.3–1.2)
Total Protein: 5.8 g/dL — ABNORMAL LOW (ref 6.5–8.1)

## 2020-12-17 LAB — CBC WITH DIFFERENTIAL (CANCER CENTER ONLY)
Abs Immature Granulocytes: 1.05 10*3/uL — ABNORMAL HIGH (ref 0.00–0.07)
Basophils Absolute: 0.1 10*3/uL (ref 0.0–0.1)
Basophils Relative: 0 %
Eosinophils Absolute: 0.6 10*3/uL — ABNORMAL HIGH (ref 0.0–0.5)
Eosinophils Relative: 0 %
HCT: 36 % (ref 36.0–46.0)
Hemoglobin: 10 g/dL — ABNORMAL LOW (ref 12.0–15.0)
Immature Granulocytes: 1 %
Lymphocytes Relative: 86 %
Lymphs Abs: 192.7 10*3/uL — ABNORMAL HIGH (ref 0.7–4.0)
MCH: 27.5 pg (ref 26.0–34.0)
MCHC: 27.8 g/dL — ABNORMAL LOW (ref 30.0–36.0)
MCV: 99.2 fL (ref 80.0–100.0)
Monocytes Absolute: 17.2 10*3/uL — ABNORMAL HIGH (ref 0.1–1.0)
Monocytes Relative: 8 %
Neutro Abs: 10.7 10*3/uL — ABNORMAL HIGH (ref 1.7–7.7)
Neutrophils Relative %: 5 %
Platelet Count: 132 10*3/uL — ABNORMAL LOW (ref 150–400)
RBC: 3.63 MIL/uL — ABNORMAL LOW (ref 3.87–5.11)
RDW: 19 % — ABNORMAL HIGH (ref 11.5–15.5)
WBC Count: 222.3 10*3/uL (ref 4.0–10.5)
nRBC: 0 % (ref 0.0–0.2)

## 2020-12-17 NOTE — Assessment & Plan Note (Signed)
Her leukocytosis is higher but this is not unexpected side effects from acalabrutinib She will continue treatment as scheduled I will see her again in 3 weeks for further follow-up

## 2020-12-17 NOTE — Progress Notes (Signed)
[place] OFFICE PROGRESS NOTE  Patient Care Team: Jonathon Jordan, MD as PCP - General (Family Medicine) Heath Lark, MD as Referring Physician (Hematology and Oncology)  ASSESSMENT & PLAN:  CLL (chronic lymphocytic leukemia) ([place]) Her leukocytosis is higher but this is not unexpected side effects from acalabrutinib She will continue treatment as scheduled I will see her again in 3 weeks for further follow-up  Pancytopenia, acquired ([place]) The cause of her pancytopenia is multifactorial She is not symptomatic Observe for now  Acute diastolic CHF (congestive heart failure) ([place]) Clinically, she is improving She has lost approximately 5 pounds of fluid weight I reviewed her echocardiogram result with the patient and her sister and gave her a copy For now, I recommend she continues on furosemide  CKD (chronic kidney disease), symptom management only, stage 3 (moderate) ([place]) She has slight elevated serum creatinine from baseline but likely due to diuretic therapy For now, I recommend she continues the same   No orders of the defined types were placed in this encounter.   All questions were answered. The patient knows to call the clinic with any problems, questions or concerns. The total time spent in the appointment was 30 minutes encounter with patients including review of chart and various tests results, discussions about plan of care and coordination of care plan   Heath Lark, MD 12/17/2020 1:44 PM  INTERVAL HISTORY: Please see below for problem oriented charting. She returns with her sister for further follow-up She is doing better Her oxygen saturation on room air is over 93% She has minimum cough She complains of fatigue No dizziness or chest pain Her sister has numerous questions related to her discharge medications and hospital findings  SUMMARY OF ONCOLOGIC HISTORY: Oncology History Overview Note  FISH del 13 q   CLL (chronic lymphocytic  leukemia) ([place])  07/02/2010 Procedure   LN biopsy confirmed CLL   08/18/2010 Procedure   Bone marrow biopsy confirmed CLL   11/18/2010 - 04/15/2011 Chemotherapy   patient received 6 cycles of FLudarabine and Rituximab   01/13/2012 Relapse/Recurrence   Repeat BM biopsy confirmed relapse   01/27/2012 - 06/23/2012 Chemotherapy   Bendamustine & Rituximab given, complicated by infusion reaction to Rituximab, completed 6 cycles   06/29/2013 Imaging   Ct scan confirmed disease relapse with bulky lymphadenopathy   07/23/2013 Procedure   Repeat BM biopsy due to thrombocytopenia and progressive leukocytosis   08/02/2013 Relapse/Recurrence   Patient consented to start iburitinib as salvage therapy for relapsed CLL   11/09/2013 Imaging   Repeat CT scan the chest, abdomen and pelvis showed greater than 50% response to treatment   06/17/2014 Imaging   Repeat CT scan showed near complete response to treatment.   05/30/2015 Imaging   Repeat CT scan showed near complete response to treatment.   06/14/2016 Imaging   CT scan showed stable scattered small retroperitoneal lymph nodes and pelvic lymph nodes. No findings for recurrent or progressive lymphoma. Stable mild splenomegaly.   12/30/2017 Imaging   1. Recurrent adenopathy within the chest, abdomen, and pelvis. 2. Splenomegaly. 3. Aortic Atherosclerosis (ICD10-I70.0). 4. Coronary artery calcifications.   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 The catheter is ready for immediate use.   01/24/2018 -  Chemotherapy   The patient had Gazyva and 1 dose of bendamustine. Bendamustine was discontinued due to inability  to get insurance approval   05/08/2018 Imaging   1. Interval partial treatment response. Bilateral axillary, mediastinal, right hilar, retroperitoneal and bilateral pelvic adenopathy is  all decreased. Mild splenomegaly, decreased. No new or progressive disease. 2. Aortic Atherosclerosis (ICD10-I70.0).   01/31/2020 Imaging   1. Since 12/29/2017, marked progression of adenopathy within the chest, abdomen, and pelvis as detailed above. 2. Progressive splenomegaly, consistent with splenic involvement. 3. New pulmonary parenchymal findings which could represent interval infection (including atypical etiologies) or aspiration. 4. Coronary artery atherosclerosis. Aortic Atherosclerosis (ICD10-I70.0).   02/07/2020 - 07/22/2020 Chemotherapy   The patient bendamustine for chemotherapy treatment.     04/28/2020 Imaging   Chest Impression:   1. New consolidation in the LEFT upper lobe is concerning for pneumonia versus lymphoma. Recommend clinical correlation with pulmonary infection. Favor recurrent pulmonary infection in light of RIGHT lung infection demonstrated on CT January 31, 2020. 2. Resolution of ground-glass densities in the RIGHT upper lobe and consolidation in the RIGHT lower lobe consistent resolved pulmonary infection. 3. Persistent enlarged axillary, mediastinal, and hilar lymph nodes.  Overall adenopathy is mildly improved.   Abdomen / Pelvis Impression:   1. Improved bulky retroperitoneal and iliac lymphadenopathy. The large lymph nodes are measurably decreased in size although remain bulky. 2. Marked splenomegaly also improved.   08/11/2020 Imaging   1. Disease progression, as evidenced by increase in adenopathy within the chest, abdomen, and pelvis. Persistent hepatosplenomegaly, with probable splenic involvement. 2. Although the left upper lobe consolidation has resolved, there are new areas of pulmonary opacity which could represent pulmonary lymphoma or concurrent pneumonia. 3. Coronary artery atherosclerosis. Aortic Atherosclerosis   08/18/2020 -  Chemotherapy   The patient had acalabrutinib for chemotherapy treatment.     12/03/2020 Imaging   1. No acute  pulmonary embolism. 2. New bilateral patchy, nodular and consolidative airspace disease most consistent multifocal pneumonia including atypical pneumonia. Secondary differential would include a pulmonary lymphoma, less favored. 3. Bulky mediastinal and axillary lymphadenopathy related to CLL. No change from prior. 4. Splenomegaly and upper abdominal adenopathy related to CLL.     12/03/2020 - 12/09/2020 Hospital Admission   She was admitted to the hospital for hypoxemic respiratory failure.  Infection was ruled out.  The patient has diastolic heart dysfunction causing fluid retention and coughing.  She was subsequently discharged     REVIEW OF SYSTEMS:   Constitutional: Denies fevers, chills or abnormal weight loss Eyes: Denies blurriness of vision Ears, nose, mouth, throat, and face: Denies mucositis or sore throat Cardiovascular: Denies palpitation, chest discomfort or lower extremity swelling Gastrointestinal:  Denies nausea, heartburn or change in bowel habits Skin: Denies abnormal skin rashes Lymphatics: Denies new lymphadenopathy or easy bruising Neurological:Denies numbness, tingling or new weaknesses Behavioral/Psych: Mood is stable, no new changes  All other systems were reviewed with the patient and are negative.  I have reviewed the past medical history, past surgical history, social history and family history with the patient and they are unchanged from previous note.  ALLERGIES:  is allergic to azithromycin and ibuprofen.  MEDICATIONS:  Current Outpatient Medications  Medication Sig Dispense Refill  . acalabrutinib (CALQUENCE) 100 MG capsule Take 1 capsule (100 mg total) by mouth 2 (two) times daily. 60 capsule 11  . acetaminophen (TYLENOL) 325 MG tablet Take 650 mg by mouth every 6 (six) hours as needed for mild pain.    Marland Kitchen acyclovir (ZOVIRAX) 400 MG tablet Take 1 tablet (400 mg total) by mouth daily. 30 tablet 5  . albuterol (  VENTOLIN HFA) 108 (90 Base) MCG/ACT inhaler  Inhale 2 puffs into the lungs in the morning and at bedtime.    Marland Kitchen amLODipine (NORVASC) 10 MG tablet TAKE 1 TABLET (10 MG TOTAL) BY MOUTH DAILY. (Patient taking differently: Take 10 mg by mouth daily.) 90 tablet 1  . Azelastine HCl 0.15 % SOLN Place 2 sprays into both nostrils daily.    . bisoprolol (ZEBETA) 10 MG tablet Take 10 mg by mouth daily.    . citalopram (CELEXA) 20 MG tablet Take 20 mg by mouth every morning.     . fluticasone-salmeterol (ADVAIR HFA) 115-21 MCG/ACT inhaler Inhale 2 puffs into the lungs 2 (two) times daily.    . furosemide (LASIX) 20 MG tablet Take 1 tablet (20 mg total) by mouth daily. 30 tablet 1  . gabapentin (NEURONTIN) 300 MG capsule Take 300 mg by mouth at bedtime.    Marland Kitchen levocetirizine (XYZAL) 5 MG tablet Take 5 mg by mouth every evening.    . lidocaine-prilocaine (EMLA) cream Apply to affected area once (Patient taking differently: Apply 1 application topically daily as needed (port access).) 30 g 5  . losartan (COZAAR) 100 MG tablet Take 100 mg by mouth daily.    . Multiple Vitamins-Minerals (PRESERVISION AREDS 2) CAPS Take 1 capsule by mouth 2 (two) times daily.    . ondansetron (ZOFRAN) 8 MG tablet Take 1 tablet (8 mg total) by mouth 2 (two) times daily. Start second day after chemotherapy. Then take as needed for nausea or vomiting. (Patient taking differently: Take 8 mg by mouth every 8 (eight) hours as needed for nausea or vomiting.) 30 tablet 1  . pantoprazole (PROTONIX) 40 MG tablet Take 1 tablet (40 mg total) by mouth daily. 30 tablet 1  . sulfamethoxazole-trimethoprim (BACTRIM DS) 800-160 MG tablet Take 1 tablet by mouth 3 (three) times a week. 12 tablet 5  . Vitamin D, Ergocalciferol, 2000 units CAPS Take 2,000 Units by mouth daily.     No current facility-administered medications for this visit.    PHYSICAL EXAMINATION: ECOG PERFORMANCE STATUS: 1 - Symptomatic but completely ambulatory  Vitals:   12/17/20 1058  BP: (!) 110/49  Pulse: 72  Resp: 18   Temp: 98.2 F (36.8 C)  SpO2: 93%   Filed Weights   12/17/20 1058  Weight: 221 lb 12.8 oz (100.6 kg)    GENERAL:alert, no distress and comfortable SKIN: Noted skin bruising EYES: normal, Conjunctiva are pink and non-injected, sclera clear OROPHARYNX:no exudate, no erythema and lips, buccal mucosa, and tongue normal  NECK: supple, thyroid normal size, non-tender, without nodularity LYMPH:  no palpable lymphadenopathy in the cervical, axillary or inguinal LUNGS: clear to auscultation and percussion with normal breathing effort HEART: regular rate & rhythm and no murmurs and no lower extremity edema ABDOMEN:abdomen soft, non-tender and normal bowel sounds Musculoskeletal:no cyanosis of digits and no clubbing  NEURO: alert & oriented x 3 with fluent speech, no focal motor/sensory deficits  LABORATORY DATA:  I have reviewed the data as listed    Component Value Date/Time   NA 145 12/17/2020 1037   NA 141 08/23/2017 1026   K 4.1 12/17/2020 1037   K 3.7 08/23/2017 1026   CL 107 12/17/2020 1037   CL 98 02/02/2013 1353   CO2 27 12/17/2020 1037   CO2 33 (H) 08/23/2017 1026   GLUCOSE 91 12/17/2020 1037   GLUCOSE 92 08/23/2017 1026   GLUCOSE 99 02/02/2013 1353   BUN 21 12/17/2020 1037   BUN 21.0 08/23/2017  1026   CREATININE 1.42 (H) 12/17/2020 1037   CREATININE 1.2 (H) 08/23/2017 1026   CALCIUM 9.1 12/17/2020 1037   CALCIUM 9.3 08/23/2017 1026   PROT 5.8 (L) 12/17/2020 1037   PROT 5.9 (L) 03/28/2020 1044   PROT 6.3 (L) 08/23/2017 1026   ALBUMIN 3.7 12/17/2020 1037   ALBUMIN 3.8 08/23/2017 1026   AST 10 (L) 12/17/2020 1037   AST 11 08/23/2017 1026   ALT 10 12/17/2020 1037   ALT 10 08/23/2017 1026   ALKPHOS 99 12/17/2020 1037   ALKPHOS 66 08/23/2017 1026   BILITOT 0.4 12/17/2020 1037   BILITOT 0.59 08/23/2017 1026   GFRNONAA 39 (L) 12/17/2020 1037   GFRAA 49 (L) 07/21/2020 1120    No results found for: SPEP, UPEP  Lab Results  Component Value Date   WBC 222.3 (HH)  12/17/2020   NEUTROABS 10.7 (H) 12/17/2020   HGB 10.0 (L) 12/17/2020   HCT 36.0 12/17/2020   MCV 99.2 12/17/2020   PLT 132 (L) 12/17/2020      Chemistry      Component Value Date/Time   NA 145 12/17/2020 1037   NA 141 08/23/2017 1026   K 4.1 12/17/2020 1037   K 3.7 08/23/2017 1026   CL 107 12/17/2020 1037   CL 98 02/02/2013 1353   CO2 27 12/17/2020 1037   CO2 33 (H) 08/23/2017 1026   BUN 21 12/17/2020 1037   BUN 21.0 08/23/2017 1026   CREATININE 1.42 (H) 12/17/2020 1037   CREATININE 1.2 (H) 08/23/2017 1026      Component Value Date/Time   CALCIUM 9.1 12/17/2020 1037   CALCIUM 9.3 08/23/2017 1026   ALKPHOS 99 12/17/2020 1037   ALKPHOS 66 08/23/2017 1026   AST 10 (L) 12/17/2020 1037   AST 11 08/23/2017 1026   ALT 10 12/17/2020 1037   ALT 10 08/23/2017 1026   BILITOT 0.4 12/17/2020 1037   BILITOT 0.59 08/23/2017 1026       RADIOGRAPHIC STUDIES: I have personally reviewed the radiological images as listed and agreed with the findings in the report. DG Chest 2 View  Result Date: 12/07/2020 CLINICAL DATA:  75 year old female with a history of CLL and hypoxia EXAM: CHEST - 2 VIEW COMPARISON:  Chest CT 12/03/2020, plain film 11/11/2020 FINDINGS: Cardiomediastinal silhouette unchanged in size and contour. Rounded opacity in the right hilar region, corresponding to adenopathy on the contemporaneous chest CT. Pattern of reticulonodular opacities of the bilateral lungs, corresponding to findings on prior CT. On the lateral view there is partial volume loss/consolidation of the right middle lobe, present on the CT. Minimal blunting of the right costophrenic angle. No pneumothorax. Right IJ port catheter. IMPRESSION: Reticulonodular opacities, compatible with multifocal infection and present on recent CT. As was noted, pulmonary lymphoma considered less likely. Partial volume loss/consolidation of the right middle lobe, present on comparison CT. Trace right-sided pleural fluid. Unchanged  right port catheter. Electronically Signed   By: Corrie Mckusick D.O.   On: 12/07/2020 15:25   CT Angio Chest PE W and/or Wo Contrast  Result Date: 12/03/2020 CLINICAL DATA:  Concern for pulmonary embolism. History of chronic lymphocytic leukemia. EXAM: CT ANGIOGRAPHY CHEST WITH CONTRAST TECHNIQUE: Multidetector CT imaging of the chest was performed using the standard protocol during bolus administration of intravenous contrast. Multiplanar CT image reconstructions and MIPs were obtained to evaluate the vascular anatomy. CONTRAST:  33m OMNIPAQUE IOHEXOL 350 MG/ML SOLN COMPARISON:  CT 08/11/2020 FINDINGS: Cardiovascular: No filling defects within the pulmonary arteries to suggest  acute pulmonary embolism. Mediastinum/Nodes: Bulky mediastinal perihilar lymphadenopathy again noted. For example lymph node along the RIGHT bronchus intermedius measuring 25 mm compares to 25 mm. RIGHT lower paratracheal node measuring 18 mm compares to 15 mm. Lungs/Pleura: There is patchy bilateral nodular airspace disease which is worsened from comparison exam. Consolidation and peripheral nodularity in the posterior RIGHT upper lobe on image 65/6 is worsened. There is a consolidation at the LEFT lung base measuring 3.4 cm (image 88/6) which is new. There is atelectasis in the RIGHT middle lobe slightly increased. There is a RIGHT pleural effusion which is new. W Ithin the lung apices there is patchy nodular airspace disease also worsened from comparison exam. Upper Abdomen: A periaortic retroperitoneal adenopathy similar to prior. Spleen appears enlarged. Musculoskeletal: No aggressive osseous lesion Review of the MIP images confirms the above findings. IMPRESSION: 1. No acute pulmonary embolism. 2. New bilateral patchy, nodular and consolidative airspace disease most consistent multifocal pneumonia including atypical pneumonia. Secondary differential would include a pulmonary lymphoma, less favored. 3. Bulky mediastinal and axillary  lymphadenopathy related to CLL. No change from prior. 4. Splenomegaly and upper abdominal adenopathy related to CLL. Electronically Signed   By: Suzy Bouchard M.D.   On: 12/03/2020 13:41   ECHOCARDIOGRAM COMPLETE  Result Date: 12/08/2020    ECHOCARDIOGRAM REPORT   Patient Name:   Erin Moore Date of Exam: 12/08/2020 Medical Rec #:  [id number]      Height:       62.0 in Accession #:    [id number]     Weight:       214.6 lb Date of Birth:  January 03, 1946      BSA:          1.970 m Patient Age:    75 years       BP:           132/59 mmHg Patient Gender: F              HR:           66 bpm. Exam Location:  Inpatient Procedure: 2D Echo Indications:    Dyspnea R06.00  History:        Patient has no prior history of Echocardiogram examinations.                 Risk Factors:Hypertension and Dyslipidemia.  Sonographer:    Mikki Santee RDCS (AE) Referring Phys: [place]  1. Left ventricular ejection fraction, by estimation, is 60 to 65%. The left ventricle has normal function. The left ventricle has no regional wall motion abnormalities. Left ventricular diastolic parameters are consistent with Grade I diastolic dysfunction (impaired relaxation).  2. Right ventricular systolic function is normal. The right ventricular size is normal.  3. Mild mitral valve regurgitation  4. inferior vena cava is normal in size with greater than 50% respiratory variability, suggesting right atrial pressure of 3 mmHg. FINDINGS  Left Ventricle: Left ventricular ejection fraction, by estimation, is 60 to 65%. The left ventricle has normal function. The left ventricle has no regional wall motion abnormalities. The left ventricular internal cavity size was normal in size. There is  no left ventricular hypertrophy. Left ventricular diastolic parameters are consistent with Grade I diastolic dysfunction (impaired relaxation). Right Ventricle: The right ventricular size is normal. Right vetricular wall thickness was  not assessed. Right ventricular systolic function is normal. Left Atrium: Left atrial size was normal in size. Right Atrium: Right atrial size was normal in size.  Pericardium: There is no evidence of pericardial effusion. Mitral Valve: The mitral valve is abnormal. There is mild thickening of the mitral valve leaflet(s). Mild mitral valve regurgitation. Tricuspid Valve: The tricuspid valve is normal in structure. Tricuspid valve regurgitation is trivial. Aortic Valve: The aortic valve is tricuspid. Aortic valve regurgitation is not visualized. Mild aortic valve sclerosis is present, with no evidence of aortic valve stenosis. Pulmonic Valve: The pulmonic valve was not well visualized. Pulmonic valve regurgitation is not visualized. Aorta: The aortic root is normal in size and structure. Venous: The inferior vena cava is normal in size with greater than 50% respiratory variability, suggesting right atrial pressure of 3 mmHg. IAS/Shunts: The interatrial septum was not assessed.  LEFT VENTRICLE PLAX 2D LVIDd:         5.00 cm  Diastology LVIDs:         3.10 cm  LV e' medial:    5.55 cm/s LV PW:         1.00 cm  LV E/e' medial:  15.6 LV IVS:        0.90 cm  LV e' lateral:   7.51 cm/s LVOT diam:     2.00 cm  LV E/e' lateral: 11.5 LV SV:         88 LV SV Index:   45 LVOT Area:     3.14 cm  RIGHT VENTRICLE RV S prime:     16.30 cm/s TAPSE (M-mode): 2.6 cm LEFT ATRIUM             Index       RIGHT ATRIUM           Index LA diam:        3.80 cm 1.93 cm/m  RA Area:     23.40 cm LA Vol (A2C):   43.0 ml 21.83 ml/m RA Volume:   74.60 ml  37.87 ml/m LA Vol (A4C):   57.2 ml 29.04 ml/m LA Biplane Vol: 50.9 ml 25.84 ml/m  AORTIC VALVE LVOT Vmax:   126.00 cm/s LVOT Vmean:  85.800 cm/s LVOT VTI:    0.280 m  AORTA Ao Root diam: 2.80 cm MITRAL VALVE MV Area (PHT): 3.27 cm     SHUNTS MV Decel Time: 232 msec     Systemic VTI:  0.28 m MV E velocity: 86.60 cm/s   Systemic Diam: 2.00 cm MV A velocity: 114.00 cm/s MV E/A ratio:  0.76  Dorris Carnes MD Electronically signed by Dorris Carnes MD Signature Date/Time: 12/08/2020/4:17:05 PM    Final

## 2020-12-17 NOTE — Assessment & Plan Note (Signed)
Clinically, she is improving She has lost approximately 5 pounds of fluid weight I reviewed her echocardiogram result with the patient and her sister and gave her a copy For now, I recommend she continues on furosemide

## 2020-12-17 NOTE — Assessment & Plan Note (Signed)
She has slight elevated serum creatinine from baseline but likely due to diuretic therapy For now, I recommend she continues the same

## 2020-12-17 NOTE — Progress Notes (Signed)
CRITICAL VALUE STICKER  CRITICAL VALUE:  RECEIVER (on-site recipient of call): Hassan Rowan, [place] NOTIFIED: 12/17/20 AT 69  MESSENGER (representative from lab): see lab note  MD NOTIFIED: Dr. Alvy Bimler  TIME OF NOTIFICATION:1100  RESPONSE: Will see at office visit.

## 2020-12-17 NOTE — Assessment & Plan Note (Signed)
The cause of her pancytopenia is multifactorial She is not symptomatic Observe for now

## 2020-12-18 DIAGNOSIS — R262 Difficulty in walking, not elsewhere classified: Secondary | ICD-10-CM | POA: Diagnosis not present

## 2020-12-18 DIAGNOSIS — R2681 Unsteadiness on feet: Secondary | ICD-10-CM | POA: Diagnosis not present

## 2020-12-18 DIAGNOSIS — M6281 Muscle weakness (generalized): Secondary | ICD-10-CM | POA: Diagnosis not present

## 2020-12-19 DIAGNOSIS — R2681 Unsteadiness on feet: Secondary | ICD-10-CM | POA: Diagnosis not present

## 2020-12-19 DIAGNOSIS — M6281 Muscle weakness (generalized): Secondary | ICD-10-CM | POA: Diagnosis not present

## 2020-12-19 DIAGNOSIS — R262 Difficulty in walking, not elsewhere classified: Secondary | ICD-10-CM | POA: Diagnosis not present

## 2020-12-22 DIAGNOSIS — M6281 Muscle weakness (generalized): Secondary | ICD-10-CM | POA: Diagnosis not present

## 2020-12-22 DIAGNOSIS — R262 Difficulty in walking, not elsewhere classified: Secondary | ICD-10-CM | POA: Diagnosis not present

## 2020-12-22 DIAGNOSIS — R2681 Unsteadiness on feet: Secondary | ICD-10-CM | POA: Diagnosis not present

## 2020-12-24 DIAGNOSIS — Z1159 Encounter for screening for other viral diseases: Secondary | ICD-10-CM | POA: Diagnosis not present

## 2020-12-24 DIAGNOSIS — Z0000 Encounter for general adult medical examination without abnormal findings: Secondary | ICD-10-CM | POA: Diagnosis not present

## 2020-12-24 DIAGNOSIS — Z79899 Other long term (current) drug therapy: Secondary | ICD-10-CM | POA: Diagnosis not present

## 2020-12-24 DIAGNOSIS — I2510 Atherosclerotic heart disease of native coronary artery without angina pectoris: Secondary | ICD-10-CM | POA: Diagnosis not present

## 2020-12-24 DIAGNOSIS — E559 Vitamin D deficiency, unspecified: Secondary | ICD-10-CM | POA: Diagnosis not present

## 2020-12-24 DIAGNOSIS — E039 Hypothyroidism, unspecified: Secondary | ICD-10-CM | POA: Diagnosis not present

## 2020-12-25 DIAGNOSIS — M6281 Muscle weakness (generalized): Secondary | ICD-10-CM | POA: Diagnosis not present

## 2020-12-25 DIAGNOSIS — R2681 Unsteadiness on feet: Secondary | ICD-10-CM | POA: Diagnosis not present

## 2020-12-25 DIAGNOSIS — R262 Difficulty in walking, not elsewhere classified: Secondary | ICD-10-CM | POA: Diagnosis not present

## 2020-12-26 DIAGNOSIS — M6281 Muscle weakness (generalized): Secondary | ICD-10-CM | POA: Diagnosis not present

## 2020-12-26 DIAGNOSIS — R2681 Unsteadiness on feet: Secondary | ICD-10-CM | POA: Diagnosis not present

## 2020-12-26 DIAGNOSIS — R262 Difficulty in walking, not elsewhere classified: Secondary | ICD-10-CM | POA: Diagnosis not present

## 2020-12-29 ENCOUNTER — Inpatient Hospital Stay (HOSPITAL_COMMUNITY)
Admission: EM | Admit: 2020-12-29 | Discharge: 2021-01-01 | DRG: 291 | Disposition: A | Discharge status: Nursing Home (Includes ICF) | Payer: Medicare Other | Source: Skilled Nursing Facility | Attending: Internal Medicine | Admitting: Internal Medicine

## 2020-12-29 ENCOUNTER — Encounter (HOSPITAL_COMMUNITY): Payer: Self-pay

## 2020-12-29 ENCOUNTER — Emergency Department (HOSPITAL_COMMUNITY): Payer: Medicare Other

## 2020-12-29 ENCOUNTER — Other Ambulatory Visit: Payer: Self-pay

## 2020-12-29 DIAGNOSIS — Z96652 Presence of left artificial knee joint: Secondary | ICD-10-CM | POA: Diagnosis not present

## 2020-12-29 DIAGNOSIS — I5033 Acute on chronic diastolic (congestive) heart failure: Secondary | ICD-10-CM | POA: Diagnosis not present

## 2020-12-29 DIAGNOSIS — I2510 Atherosclerotic heart disease of native coronary artery without angina pectoris: Secondary | ICD-10-CM | POA: Diagnosis not present

## 2020-12-29 DIAGNOSIS — J9690 Respiratory failure, unspecified, unspecified whether with hypoxia or hypercapnia: Secondary | ICD-10-CM | POA: Diagnosis not present

## 2020-12-29 DIAGNOSIS — M6281 Muscle weakness (generalized): Secondary | ICD-10-CM | POA: Diagnosis not present

## 2020-12-29 DIAGNOSIS — C9110 Chronic lymphocytic leukemia of B-cell type not having achieved remission: Secondary | ICD-10-CM | POA: Diagnosis present

## 2020-12-29 DIAGNOSIS — R2681 Unsteadiness on feet: Secondary | ICD-10-CM | POA: Diagnosis not present

## 2020-12-29 DIAGNOSIS — Z20822 Contact with and (suspected) exposure to covid-19: Secondary | ICD-10-CM | POA: Diagnosis not present

## 2020-12-29 DIAGNOSIS — I5031 Acute diastolic (congestive) heart failure: Secondary | ICD-10-CM | POA: Diagnosis not present

## 2020-12-29 DIAGNOSIS — Z886 Allergy status to analgesic agent status: Secondary | ICD-10-CM | POA: Diagnosis not present

## 2020-12-29 DIAGNOSIS — D849 Immunodeficiency, unspecified: Secondary | ICD-10-CM | POA: Diagnosis present

## 2020-12-29 DIAGNOSIS — I130 Hypertensive heart and chronic kidney disease with heart failure and stage 1 through stage 4 chronic kidney disease, or unspecified chronic kidney disease: Secondary | ICD-10-CM | POA: Diagnosis not present

## 2020-12-29 DIAGNOSIS — K219 Gastro-esophageal reflux disease without esophagitis: Secondary | ICD-10-CM | POA: Diagnosis not present

## 2020-12-29 DIAGNOSIS — E785 Hyperlipidemia, unspecified: Secondary | ICD-10-CM | POA: Diagnosis present

## 2020-12-29 DIAGNOSIS — K224 Dyskinesia of esophagus: Secondary | ICD-10-CM | POA: Diagnosis not present

## 2020-12-29 DIAGNOSIS — C8590 Non-Hodgkin lymphoma, unspecified, unspecified site: Secondary | ICD-10-CM | POA: Diagnosis not present

## 2020-12-29 DIAGNOSIS — Z79899 Other long term (current) drug therapy: Secondary | ICD-10-CM | POA: Diagnosis not present

## 2020-12-29 DIAGNOSIS — D61818 Other pancytopenia: Secondary | ICD-10-CM | POA: Diagnosis present

## 2020-12-29 DIAGNOSIS — R591 Generalized enlarged lymph nodes: Secondary | ICD-10-CM | POA: Diagnosis not present

## 2020-12-29 DIAGNOSIS — J189 Pneumonia, unspecified organism: Secondary | ICD-10-CM

## 2020-12-29 DIAGNOSIS — R918 Other nonspecific abnormal finding of lung field: Secondary | ICD-10-CM | POA: Diagnosis not present

## 2020-12-29 DIAGNOSIS — Z881 Allergy status to other antibiotic agents status: Secondary | ICD-10-CM | POA: Diagnosis not present

## 2020-12-29 DIAGNOSIS — N1831 Chronic kidney disease, stage 3a: Secondary | ICD-10-CM | POA: Diagnosis present

## 2020-12-29 DIAGNOSIS — R0602 Shortness of breath: Secondary | ICD-10-CM | POA: Diagnosis not present

## 2020-12-29 DIAGNOSIS — F32A Depression, unspecified: Secondary | ICD-10-CM | POA: Diagnosis not present

## 2020-12-29 DIAGNOSIS — J90 Pleural effusion, not elsewhere classified: Secondary | ICD-10-CM | POA: Diagnosis not present

## 2020-12-29 DIAGNOSIS — J9621 Acute and chronic respiratory failure with hypoxia: Secondary | ICD-10-CM | POA: Diagnosis present

## 2020-12-29 DIAGNOSIS — J690 Pneumonitis due to inhalation of food and vomit: Secondary | ICD-10-CM | POA: Diagnosis present

## 2020-12-29 DIAGNOSIS — I110 Hypertensive heart disease with heart failure: Secondary | ICD-10-CM | POA: Diagnosis not present

## 2020-12-29 DIAGNOSIS — N183 Chronic kidney disease, stage 3 unspecified: Secondary | ICD-10-CM | POA: Diagnosis not present

## 2020-12-29 DIAGNOSIS — Z7189 Other specified counseling: Secondary | ICD-10-CM | POA: Diagnosis not present

## 2020-12-29 DIAGNOSIS — R262 Difficulty in walking, not elsewhere classified: Secondary | ICD-10-CM | POA: Diagnosis not present

## 2020-12-29 DIAGNOSIS — J9601 Acute respiratory failure with hypoxia: Secondary | ICD-10-CM | POA: Diagnosis present

## 2020-12-29 DIAGNOSIS — J984 Other disorders of lung: Secondary | ICD-10-CM | POA: Diagnosis not present

## 2020-12-29 DIAGNOSIS — R059 Cough, unspecified: Secondary | ICD-10-CM

## 2020-12-29 DIAGNOSIS — Z9981 Dependence on supplemental oxygen: Secondary | ICD-10-CM | POA: Diagnosis not present

## 2020-12-29 DIAGNOSIS — R0902 Hypoxemia: Secondary | ICD-10-CM

## 2020-12-29 LAB — RESP PANEL BY RT-PCR (FLU A&B, COVID) ARPGX2
Influenza A by PCR: NEGATIVE
Influenza B by PCR: NEGATIVE
SARS Coronavirus 2 by RT PCR: NEGATIVE

## 2020-12-29 LAB — COMPREHENSIVE METABOLIC PANEL
ALT: 8 U/L (ref 0–44)
AST: 14 U/L — ABNORMAL LOW (ref 15–41)
Albumin: 3.5 g/dL (ref 3.5–5.0)
Alkaline Phosphatase: 86 U/L (ref 38–126)
Anion gap: 9 (ref 5–15)
BUN: 22 mg/dL (ref 8–23)
CO2: 27 mmol/L (ref 22–32)
Calcium: 8.9 mg/dL (ref 8.9–10.3)
Chloride: 105 mmol/L (ref 98–111)
Creatinine, Ser: 1.29 mg/dL — ABNORMAL HIGH (ref 0.44–1.00)
GFR, Estimated: 43 mL/min — ABNORMAL LOW (ref >60)
Glucose, Bld: 112 mg/dL — ABNORMAL HIGH (ref 70–99)
Potassium: 4.9 mmol/L (ref 3.5–5.1)
Sodium: 141 mmol/L (ref 135–145)
Total Bilirubin: 0.5 mg/dL (ref 0.3–1.2)
Total Protein: 5.6 g/dL — ABNORMAL LOW (ref 6.5–8.1)

## 2020-12-29 LAB — CBC WITH DIFFERENTIAL/PLATELET
Abs Immature Granulocytes: 0.5 10*3/uL — ABNORMAL HIGH (ref 0.00–0.07)
Basophils Absolute: 0.2 10*3/uL — ABNORMAL HIGH (ref 0.0–0.1)
Basophils Relative: 0 %
Eosinophils Absolute: 0.6 10*3/uL — ABNORMAL HIGH (ref 0.0–0.5)
Eosinophils Relative: 0 %
HCT: 32.3 % — ABNORMAL LOW (ref 36.0–46.0)
Hemoglobin: 9 g/dL — ABNORMAL LOW (ref 12.0–15.0)
Immature Granulocytes: 0 %
Lymphocytes Relative: 86 %
Lymphs Abs: 123.5 10*3/uL — ABNORMAL HIGH (ref 0.7–4.0)
MCH: 27.9 pg (ref 26.0–34.0)
MCHC: 27.9 g/dL — ABNORMAL LOW (ref 30.0–36.0)
MCV: 100 fL (ref 80.0–100.0)
Monocytes Absolute: 13.7 10*3/uL — ABNORMAL HIGH (ref 0.1–1.0)
Monocytes Relative: 10 %
Neutro Abs: 6 10*3/uL (ref 1.7–7.7)
Neutrophils Relative %: 4 %
Platelets: 144 10*3/uL — ABNORMAL LOW (ref 150–400)
RBC: 3.23 MIL/uL — ABNORMAL LOW (ref 3.87–5.11)
RDW: 17.5 % — ABNORMAL HIGH (ref 11.5–15.5)
WBC: 144.5 10*3/uL (ref 4.0–10.5)
nRBC: 0 % (ref 0.0–0.2)

## 2020-12-29 LAB — PROCALCITONIN: Procalcitonin: <0.1 ng/mL

## 2020-12-29 LAB — LACTIC ACID, PLASMA: Lactic Acid, Venous: 0.6 mmol/L (ref 0.5–1.9)

## 2020-12-29 LAB — BRAIN NATRIURETIC PEPTIDE: B Natriuretic Peptide: 155.3 pg/mL — ABNORMAL HIGH (ref 0.0–100.0)

## 2020-12-29 MED ORDER — PANTOPRAZOLE SODIUM 40 MG PO TBEC
40.0000 mg | DELAYED_RELEASE_TABLET | Freq: Every day | ORAL | Status: DC
  Administered 2020-12-30 – 2021-01-01 (×3): 40 mg via ORAL
  Filled 2020-12-29 (×4): qty 1

## 2020-12-29 MED ORDER — SODIUM CHLORIDE 0.9 % IV SOLN
2.0000 g | Freq: Two times a day (BID) | INTRAVENOUS | Status: DC
  Administered 2020-12-29 – 2021-01-01 (×6): 2 g via INTRAVENOUS
  Filled 2020-12-29 (×7): qty 2

## 2020-12-29 MED ORDER — SULFAMETHOXAZOLE-TRIMETHOPRIM 800-160 MG PO TABS
1.0000 | ORAL_TABLET | ORAL | Status: DC
  Administered 2020-12-31: 1 via ORAL
  Filled 2020-12-29: qty 1

## 2020-12-29 MED ORDER — ACETAMINOPHEN 650 MG RE SUPP: 650.0000 mg | Freq: Four times a day (QID) | RECTAL | Status: DC | PRN

## 2020-12-29 MED ORDER — FLEET ENEMA 7-19 GM/118ML RE ENEM: 1.0000 | ENEMA | Freq: Once | RECTAL | Status: DC | PRN

## 2020-12-29 MED ORDER — BISACODYL 5 MG PO TBEC: 5.0000 mg | DELAYED_RELEASE_TABLET | Freq: Every day | ORAL | Status: DC | PRN

## 2020-12-29 MED ORDER — CHLORHEXIDINE GLUCONATE CLOTH 2 % EX PADS
6.0000 | MEDICATED_PAD | Freq: Every day | CUTANEOUS | Status: DC
  Administered 2020-12-29 – 2021-01-01 (×4): 6 via TOPICAL

## 2020-12-29 MED ORDER — OXYCODONE HCL 5 MG PO TABS: 5.0000 mg | ORAL_TABLET | ORAL | Status: DC | PRN

## 2020-12-29 MED ORDER — HEPARIN SODIUM (PORCINE) 5000 UNIT/ML IJ SOLN
5000.0000 [IU] | Freq: Three times a day (TID) | INTRAMUSCULAR | Status: DC
  Administered 2020-12-29 – 2021-01-01 (×9): 5000 [IU] via SUBCUTANEOUS
  Filled 2020-12-29 (×9): qty 1

## 2020-12-29 MED ORDER — MORPHINE SULFATE (PF) 2 MG/ML IV SOLN
2.0000 mg | INTRAVENOUS | Status: DC | PRN
Start: 2020-12-29 — End: 2021-01-01

## 2020-12-29 MED ORDER — ACALABRUTINIB 100 MG PO CAPS: 100.0000 mg | ORAL_CAPSULE | Freq: Two times a day (BID) | ORAL | Status: DC

## 2020-12-29 MED ORDER — GABAPENTIN 300 MG PO CAPS
300.0000 mg | ORAL_CAPSULE | Freq: Every day | ORAL | Status: DC
  Administered 2020-12-29 – 2020-12-31 (×3): 300 mg via ORAL
  Filled 2020-12-29 (×3): qty 1

## 2020-12-29 MED ORDER — ALBUTEROL SULFATE (2.5 MG/3ML) 0.083% IN NEBU
2.5000 mg | INHALATION_SOLUTION | Freq: Four times a day (QID) | RESPIRATORY_TRACT | Status: DC
  Administered 2020-12-29 – 2021-01-01 (×12): 2.5 mg via RESPIRATORY_TRACT
  Filled 2020-12-29 (×11): qty 3

## 2020-12-29 MED ORDER — CITALOPRAM HYDROBROMIDE 20 MG PO TABS
20.0000 mg | ORAL_TABLET | Freq: Every morning | ORAL | Status: DC
  Administered 2020-12-30 – 2021-01-01 (×3): 20 mg via ORAL
  Filled 2020-12-29 (×3): qty 1

## 2020-12-29 MED ORDER — HYDRALAZINE HCL 20 MG/ML IJ SOLN: 10.0000 mg | Freq: Four times a day (QID) | INTRAMUSCULAR | Status: DC | PRN

## 2020-12-29 MED ORDER — SODIUM CHLORIDE 0.9% FLUSH
10.0000 mL | INTRAVENOUS | Status: DC | PRN
  Administered 2020-12-31: 10 mL

## 2020-12-29 MED ORDER — POLYETHYLENE GLYCOL 3350 17 G PO PACK: 17.0000 g | PACK | Freq: Every day | ORAL | Status: DC | PRN

## 2020-12-29 MED ORDER — ACYCLOVIR 400 MG PO TABS
400.0000 mg | ORAL_TABLET | Freq: Every day | ORAL | Status: DC
  Administered 2020-12-30 – 2021-01-01 (×3): 400 mg via ORAL
  Filled 2020-12-29 (×3): qty 1

## 2020-12-29 MED ORDER — BISOPROLOL FUMARATE 5 MG PO TABS
10.0000 mg | ORAL_TABLET | Freq: Every day | ORAL | Status: DC
  Administered 2020-12-29 – 2021-01-01 (×4): 10 mg via ORAL
  Filled 2020-12-29 (×4): qty 2

## 2020-12-29 MED ORDER — FUROSEMIDE 10 MG/ML IJ SOLN
40.0000 mg | Freq: Once | INTRAMUSCULAR | Status: AC
  Administered 2020-12-29: 40 mg via INTRAVENOUS
  Filled 2020-12-29: qty 4

## 2020-12-29 MED ORDER — ACETAMINOPHEN 325 MG PO TABS: 650.0000 mg | ORAL_TABLET | Freq: Four times a day (QID) | ORAL | Status: DC | PRN

## 2020-12-29 MED ORDER — FUROSEMIDE 20 MG PO TABS: 20.0000 mg | ORAL_TABLET | Freq: Every day | ORAL | Status: DC

## 2020-12-29 NOTE — Progress Notes (Signed)
Pharmacy Antibiotic Note  Erin Moore is a 75 y.o. 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 She presented to the ED on 12/29/2020 with c/o SOB and cough. CXR showed findings consistent with multifocal PNA. Pharmacy has been consulted to start cefepime for infection.  Plan: - cefepime 2 gm IV q12h  ___________________________________________  Height: 5\' 2"  (157.5 cm) Weight: 99.8 kg (220 lb 2 oz) IBW/kg (Calculated) : 50.1  Temp (24hrs), Avg:97.9 F (36.6 C), Min:97.8 F (36.6 C), Max:98 F (36.7 C)  Recent Labs  Lab 12/29/20 1258  WBC 144.5*  CREATININE 1.29*  LATICACIDVEN 0.6    Estimated Creatinine Clearance: 41.6 mL/min (A) (by C-G formula based on SCr of 1.29 mg/dL (H)).    Allergies  Allergen Reactions  . Azithromycin Hives    Hives and fever  . Ibuprofen Other (See Comments) and Swelling    Hives    Thank you for allowing pharmacy to be a part of this patient's care.  Lynelle Doctor 12/29/2020 5:21 PM

## 2020-12-29 NOTE — Plan of Care (Signed)
  Problem: Education: Goal: Knowledge of General Education information will improve Description: Including pain rating scale, medication(s)/side effects and non-pharmacologic comfort measures 12/29/2020 2330 by Candie Mile, RN Outcome: Progressing 12/29/2020 2329 by Candie Mile, RN Outcome: Progressing   Problem: Clinical Measurements: Goal: Cardiovascular complication will be avoided 12/29/2020 2330 by Candie Mile, RN Outcome: Progressing 12/29/2020 2329 by Candie Mile, RN Outcome: Progressing   Problem: Coping: Goal: Level of anxiety will decrease Outcome: Progressing   Problem: Safety: Goal: Ability to remain free from injury will improve Outcome: Progressing   Problem: Skin Integrity: Goal: Risk for impaired skin integrity will decrease 12/29/2020 2330 by Candie Mile, RN Outcome: Progressing 12/29/2020 2329 by Candie Mile, RN Outcome: Progressing

## 2020-12-29 NOTE — ED Notes (Signed)
Went in room to administer lasix, explained to patient and family what medication I was administering and what it was use for, sister proceeds to say I know what it is, I pulled up medication and got ready to administer and sister says now what are you giving again I said lasix also known as furosemide it goes in her IV as a diuretic and sister didn't let me finsh and said YES I know that she has had it before, I said yes mam I was just educating you.

## 2020-12-29 NOTE — ED Notes (Signed)
MD in room at this time.

## 2020-12-29 NOTE — Progress Notes (Signed)
Brief Pharmacy Note:  Acalabrutinib held on admission per policy due to concern for active infection. Physician to evaluate and can re-order per his or her discretion.  Lindell Spar, PharmD, BCPS 12/29/2020 8:16 PM

## 2020-12-29 NOTE — ED Triage Notes (Signed)
Patient from [facility] with shob x 1 week with productive cough. Patient 74% on RA.Patient is A&O x4.

## 2020-12-29 NOTE — ED Notes (Signed)
Secure chat sent to Erin Moore

## 2020-12-29 NOTE — ED Notes (Signed)
Dr Eulis Foster also aware of WBC 144.5

## 2020-12-29 NOTE — H&P (Signed)
[facility] History and Physical  Erin Moore QQV:[id number] DOB: 01-10-1946 DOA: 12/29/2020 PCP: Jonathon Jordan, MD  Ad[facility] from: [facility] Chief Complaint: Shortness of breath, cough  History of Present Illness: Erin Moore is a 75 y.o. female with PMH significant for HTN, HLD, CLL, CKD 3, diastolic CHF, not on supplemental oxygen at baseline.  She was last hospitalized from 2/16 to 2/22 for multifocal pneumonia, diastolic CHF exacerbation treated with antibiotics, diuresed with Lasix and discharged to ALF without oxygen. Patient follows up with Dr. Alvy Bimler as an outpatient for CLL. Patient is chronically on acalabrutinib for CLL.  She has leukocytosis as a side effect of the immunotherapy.  Last seen by oncologist on 12/17/2020. Patient presented to the ED today from [facility] with shortness of breath for 1 week with productive cough, O2 sat 74% on room air.  In the ED, patient was afebrile, heart rate in 60s, blood pressure in low normal range.  Oxygen saturation more than 95% on 6 L by nasal cannula. Labs with WBC count elevated to 144,000, hemoglobin at 9, Lactic acid level normal Respiratory virus panel negative. Chest x-ray findings as below bilateral patchy consolidations with new left midlung consolidation, findings suggestive of multifocal pneumonia. However findings could also be because of lymphangitic spread of pulmonary lymphoma, edema or infection.  ED physician discussed with oncologist Dr. Alvy Bimler Hospitalist consultation was called for inpatient admission and management.  At the time of my evaluation, patient was propped up in bed.  On 6 L oxygen by nasal cannula.  Does not use oxygen at home.  Patient sister and power of attorney was at bedside.   Review of Systems:  All systems were reviewed and were negative unless otherwise mentioned in the HPI   Past medical history: Past Medical History:  Diagnosis Date  .  Chronic lymphocytic leukemia (CLL), B-cell ([place]) 12/18/2013  . CLL (chronic lymphocytic leukemia) ([place]) FALL OF 2011  . CLL (chronic lymphocytic leukemia) ([place]) 06/02/2015  . Depression   . Fatigue 07/18/2013  . Hyperlipemia   . Hypertension   . Learning disorder   . Mental disability   . Osteoarthritis   . Pharyngitis 09/18/2013  . Upper respiratory symptom 01/2018    Past surgical history: Past Surgical History:  Procedure Laterality Date  . ABDOMINAL HYSTERECTOMY    . BREAST BIOPSY Left 2011  . BREAST REDUCTION SURGERY    . CARPAL TUNNEL RELEASE     BILATERAL  . CESAREAN SECTION    . IR FLUORO GUIDE PORT INSERTION RIGHT  01/17/2018  . IR US GUIDE VASC ACCESS RIGHT  01/17/2018  . REDUCTION MAMMAPLASTY Bilateral 1969  . REPLACEMENT TOTAL KNEE     LEFT KNEE  . TONSILLECTOMY    . TUBAL LIGATION  1975   BILATERAL    Social History:  reports that she has never smoked. She has never used smokeless tobacco. She reports that she does not drink alcohol and does not use drugs.  Allergies:  Allergies  Allergen Reactions  . Azithromycin Hives    Hives and fever  . Ibuprofen Other (See Comments) and Swelling    Hives    Family history:  Family History  Problem Relation Age of Onset  . Breast cancer Mother      Home Meds: Prior to Admission medications   Medication Sig Start Date End Date Taking? Authorizing Provider  acalabrutinib (CALQUENCE) 100 MG capsule Take 1 capsule (100 mg total) by mouth 2 (two) times  daily. 12/10/20   Eugenie Filler, MD  acetaminophen (TYLENOL) 325 MG tablet Take 650 mg by mouth every 6 (six) hours as needed for mild pain.    [provider]  acyclovir (ZOVIRAX) 400 MG tablet Take 1 tablet (400 mg total) by mouth daily. 02/05/20   Heath Lark, MD  albuterol (VENTOLIN HFA) 108 (90 Base) MCG/ACT inhaler Inhale 2 puffs into the lungs in the morning and at bedtime.    [provider]  amLODipine (NORVASC) 10 MG tablet TAKE 1 TABLET (10  MG TOTAL) BY MOUTH DAILY. Patient taking differently: Take 10 mg by mouth daily. 12/09/14   Heath Lark, MD  Azelastine HCl 0.15 % SOLN Place 2 sprays into both nostrils daily. 12/03/20   [provider]  bisoprolol (ZEBETA) 10 MG tablet Take 10 mg by mouth daily. 11/28/20   [provider]  citalopram (CELEXA) 20 MG tablet Take 20 mg by mouth every morning.  11/11/10   Jonathon Jordan, MD  fluticasone-salmeterol (ADVAIR HFA) [phone] MCG/ACT inhaler Inhale 2 puffs into the lungs 2 (two) times daily.    [provider]  furosemide (LASIX) 20 MG tablet Take 1 tablet (20 mg total) by mouth daily. 12/09/20   Eugenie Filler, MD  gabapentin (NEURONTIN) 300 MG capsule Take 300 mg by mouth at bedtime.    [provider]  levocetirizine (XYZAL) 5 MG tablet Take 5 mg by mouth every evening.    [provider]  lidocaine-prilocaine (EMLA) cream Apply to affected area once Patient taking differently: Apply 1 application topically daily as needed (port access). 09/25/18   Heath Lark, MD  losartan (COZAAR) 100 MG tablet Take 100 mg by mouth daily. 11/03/20   [provider]  Multiple Vitamins-Minerals (PRESERVISION AREDS 2) CAPS Take 1 capsule by mouth 2 (two) times daily.    [provider]  ondansetron (ZOFRAN) 8 MG tablet Take 1 tablet (8 mg total) by mouth 2 (two) times daily. Start second day after chemotherapy. Then take as needed for nausea or vomiting. Patient taking differently: Take 8 mg by mouth every 8 (eight) hours as needed for nausea or vomiting. 02/06/20   Heath Lark, MD  pantoprazole (PROTONIX) 40 MG tablet Take 1 tablet (40 mg total) by mouth daily. 12/10/20   Eugenie Filler, MD  sulfamethoxazole-trimethoprim (BACTRIM DS) 800-160 MG tablet Take 1 tablet by mouth 3 (three) times a week. 02/06/20   Heath Lark, MD  Vitamin D, Ergocalciferol, 2000 units CAPS Take 2,000 Units by mouth daily.    [provider]    Physical  Exam: Vitals:   12/29/20 1530 12/29/20 1536 12/29/20 1600 12/29/20 1711  BP: (!) 104/59  (!) 107/51 (!) 128/58  Pulse: 69  68 71  Resp: (!) 26  20 20   Temp:    97.8 F (36.6 C)  TempSrc:    Oral  SpO2: 95%  97% 94%  Weight:  99.8 kg    Height:       Wt Readings from Last 3 Encounters:  12/29/20 99.8 kg  12/17/20 100.6 kg  12/09/20 97 kg   Body mass index is 40.26 kg/m.  General exam: Pleasant elderly Caucasian female.  Propped up in bed Skin: No rashes, lesions or ulcers. HEENT: Atraumatic, normocephalic, no obvious bleeding Lungs: Mild scattered rales in both lungs.  No wheezing.  Patient has intermittent coughing but no coughing breathing CVS: Regular rate and rhythm, no murmur GI/Abd soft, nontender, nondistended, bowel sound present CNS: Alert, awake,  oriented x3 Psychiatry: Mood appropriate Extremities: No pedal edema, no calf tenderness     Consult Orders  (From admission, onward)         Start     Ordered   12/29/20 1532  Consult to hospitalist  Once       Provider:  (Not yet assigned)  Question Answer Comment  Place call to: [facility]   Reason for Consult Admit      12/29/20 1531          Labs on Admission:   CBC: Recent Labs  Lab 12/29/20 1258  WBC 144.5*  NEUTROABS PENDING  HGB 9.0*  HCT 32.3*  MCV 100.0  PLT 144*    Basic Metabolic Panel: Recent Labs  Lab 12/29/20 1258  NA 141  K 4.9  CL 105  CO2 27  GLUCOSE 112*  BUN 22  CREATININE 1.29*  CALCIUM 8.9    Liver Function Tests: Recent Labs  Lab 12/29/20 1258  AST 14*  ALT 8  ALKPHOS 86  BILITOT 0.5  PROT 5.6*  ALBUMIN 3.5   No results for input(s): LIPASE, AMYLASE in the last 168 hours. No results for input(s): AMMONIA in the last 168 hours.  Cardiac Enzymes: No results for input(s): CKTOTAL, CKMB, CKMBINDEX, TROPONINI in the last 168 hours.  BNP (last 3 results) Recent Labs    12/07/20 0801  BNP 119.9*    ProBNP (last 3 results) No results for  input(s): PROBNP in the last 8760 hours.  CBG: No results for input(s): GLUCAP in the last 168 hours.  Lipase  No results found for: LIPASE   Urinalysis    Component Value Date/Time   COLORURINE YELLOW 12/03/2020 [place] 12/03/2020 1234   LABSPEC 1.017 12/03/2020 1234   PHURINE 5.0 12/03/2020 1234   GLUCOSEU NEGATIVE 12/03/2020 1234   HGBUR NEGATIVE 12/03/2020 [place] 12/03/2020 [place] 12/03/2020 1234   PROTEINUR NEGATIVE 12/03/2020 1234   NITRITE NEGATIVE 12/03/2020 1234   LEUKOCYTESUR MODERATE (A) 12/03/2020 1234     Drugs of Abuse  No results found for: LABOPIA, COCAINSCRNUR, LABBENZ, AMPHETMU, THCU, LABBARB    Radiological Exams on Admission: DG Chest Port 1 View  Result Date: 12/29/2020 CLINICAL DATA:  Shortness of breath and productive cough x1 week. EXAM: PORTABLE CHEST 1 VIEW COMPARISON:  Chest radiograph December 07, 2020 FINDINGS: Accessed right chest wall port with tip overlying the right atrium. The heart size and mediastinal contours are within unchanged. Nodular hilar contours similar to prior likely related to known bulky mediastinal adenopathy secondary to CLL. Bilateral patchy consolidations are again present with a new left midlung consolidation. Small right pleural effusion. The visualized skeletal structures are unchanged. IMPRESSION: 1. Bilateral patchy consolidations with new left midlung consolidation, findings suggestive of multifocal pneumonia. However pulmonary lymphoma slightly more suspicious on the differential in comparison prior imaging given persistence with interval increase in airspace opacities despite presumed treatment. 2. Diffuse interstitial opacities which is most likely related to pulmonary edema or an infectious process. However, in the setting of pulmonary lymphoma this would be suspicious for lymphangitic spread. 3. Small right pleural effusion increased compared to prior. Electronically  Signed   By: Dahlia Bailiff MD   On: 12/29/2020 13:58   ------------------------------------------------------------------------------------------------------ Assessment/Plan: Active Problems:   Acute respiratory failure with hypoxia (HCC)  Acute respiratory failure with hypoxia -Presented with progressively worsening shortness of breath, cough, hypoxia  -possible etiologies: Multifocal pneumonia versus CHF exacerbation versus  lymphangitic spread of lymphoma. -Not on supplemental oxygen at home.  Currently requiring 6 L by nasal cannula.  Wean down as tolerated  Multifocal pneumonia -No fever, WBC count always elevated.  Based on CT scan report and immunocompromise status, will need to consider possibility of multifocal pneumonia.  Will start empirically on IV cefepime at this time.  Obtain procalcitonin level. -Robitussin-DM scheduled for cough Recent Labs  Lab 12/29/20 1258  WBC 144.5*  LATICACIDVEN 0.6   Acute exacerbation of congestive heart failure Essential hypertension -Recent echocardiogram with EF 60 to 22%, grade 1 diastolic dysfunction -On Lasix 20 mg daily.  Has mild bilateral pedal edema. -IV Lasix was given in ED.  Obtain BNP level.  May need to repeat IV Lasix tomorrow -Resume bisoprolol.  Keep losartan and amlodipine on hold. -Continue to monitor for daily intake output, weight, blood pressure, BNP, renal function and electrolytes. 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 Alvy Bimler as an outpatient for CLL. Patient is chronically on acalabrutinib for CLL.  She has leukocytosis as a side effect of the immunotherapy.  Last seen by oncologist on 12/17/2020. -Defer to oncology for the concern of pulmonary lymphangitic spread. -Continue acalabrutinib, acyclovir, Bactrim 3 times daily  CKD stage IIIa -Creatinine remains at baseline.  Continue to  monitor Recent Labs    11/11/20 1320 12/03/20 0950 12/03/20 2034 12/04/20 0313 12/05/20 0310 12/06/20 0400 12/07/20 0306 12/08/20 0310 12/09/20 0335 12/17/20 1037 12/29/20 1258  BUN 26* 22  --  25* 32* 35* 36* 42* 43* 21 22  CREATININE 1.47* 1.55* 1.78* 1.39* 1.11* 1.06* 0.97 1.08* 1.19* 1.42* 1.29*   Mobility: Encourage ambulation. Code Status:   Code Status: Full Code full code DVT prophylaxis:  Lovenox subcu Antimicrobials:  IV cefepime Fluid: None  Diet:  Diet Order            Diet Heart Room service appropriate? Yes; Fluid consistency: Thin  Diet effective now                  Consultants: Oncology Family Communication:  Sister at bedside  Dispo: The patient is from: ALF              Anticipated d/c is to: ALF back to ALF likely              Anticipated d/c date is: 3 days  ------------------------------------------------------------------------------------- Severity of Illness: The appropriate patient status for this patient is OBSERVATION. Observation status is judged to be reasonable and necessary in order to provide the required intensity of service to ensure the patient's safety. The patient's presenting symptoms, physical exam findings, and initial radiographic and laboratory data in the context of their medical condition is felt to place them at decreased risk for further clinical deterioration. Furthermore, it is anticipated that the patient will be medically stable for discharge from the hospital within 2 midnights of admission. The following factors support the patient status of observation.   " The patient's presenting symptoms include hypoxia, shortness of breath. " The physical exam findings include hypoxia. " The initial radiographic and laboratory data are abnormal chest x-ray.  Signed, Terrilee Croak, MD [facility] 12/29/2020

## 2020-12-29 NOTE — ED Provider Notes (Addendum)
[facility] DEPT Provider Note   CSN: [id number] Arrival date & time: 12/29/20  1229     History No chief complaint on file.   Erin Moore is a 75 y.o. female.  HPI She presents for evaluation of shortness of breath ongoing for at least a week.  She has a somewhat poor historian but recalls being admitted to the hospital recently for pneumonia.  At the time I saw her, patient saturation was 96% on 2 L O2 per nasal cannula.  Sister states patient with ongoing cough since hospital discharge, and now with increasing shortness of breath.  Sister last saw the patient about a week ago.  Level 5 caveat-poor historian    Past Medical History:  Diagnosis Date  . 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 CLL (chronic lymphocytic leukemia) ([place]) FALL OF 2011  . CLL (chronic lymphocytic leukemia) ([place]) 06/02/2015  . Depression   . Fatigue 07/18/2013  . Hyperlipemia   . Hypertension   . Learning disorder   . Mental disability   . Osteoarthritis   . Pharyngitis 09/18/2013  . Upper respiratory symptom 01/2018    Patient Active Problem List   Diagnosis Date Noted  . Acute diastolic CHF (congestive heart failure) ([place])   . Hypervolemia   . Acute respiratory failure with hypoxia ([place]) 12/04/2020  . Hyperkalemia 12/04/2020  . Pneumonia 12/03/2020  . Multifocal pneumonia 12/03/2020  . Bilateral interstitial pneumonia ([place]) 08/12/2020  . Left upper lobe pneumonia 04/29/2020  . CKD (chronic kidney disease), symptom management only, stage 3 (moderate) ([place]) 03/04/2020  . Peripheral neuropathy 02/14/2020  . Vitamin B12 deficiency 03/01/2019  . Splenomegaly 05/10/2018  . Cough 03/24/2018  . Fall at home, initial encounter 02/21/2018  . Hyperuricemia 01/02/2018  . Goals of care, counseling/discussion 01/02/2018  . Pancytopenia, acquired ([place]) 12/22/2017  . Mental disability   . Thrombocytopenia, unspecified ([place]) 07/18/2013  . Other  fatigue 07/18/2013  . Essential hypertension 01/20/2012  . Obesity, Class III, BMI 40-49.9 (morbid obesity) ([place]) 12/13/2011  . CLL (chronic lymphocytic leukemia) ([place]) 12/07/2011    Past Surgical History:  Procedure Laterality Date  . ABDOMINAL HYSTERECTOMY    . BREAST BIOPSY Left 2011  . BREAST REDUCTION SURGERY    . CARPAL TUNNEL RELEASE     BILATERAL  . CESAREAN SECTION    . IR FLUORO GUIDE PORT INSERTION RIGHT  01/17/2018  . IR US GUIDE VASC ACCESS RIGHT  01/17/2018  . REDUCTION MAMMAPLASTY Bilateral 1969  . REPLACEMENT TOTAL KNEE     LEFT KNEE  . TONSILLECTOMY    . TUBAL LIGATION  1975   BILATERAL     OB History   No obstetric history on file.     Family History  Problem Relation Age of Onset  . Breast cancer Mother     Social History   Tobacco Use  . Smoking status: Never Smoker  . Smokeless tobacco: Never Used  Substance Use Topics  . Alcohol use: No  . Drug use: No    Home Medications Prior to Admission medications   Medication Sig Start Date End Date Taking? Authorizing Provider  acalabrutinib (CALQUENCE) 100 MG capsule Take 1 capsule (100 mg total) by mouth 2 (two) times daily. 12/10/20   Eugenie Filler, MD  acetaminophen (TYLENOL) 325 MG tablet Take 650 mg by mouth every 6 (six) hours as needed for mild pain.    [provider]  acyclovir (ZOVIRAX) 400 MG tablet Take 1 tablet (  400 mg total) by mouth daily. 02/05/20   Heath Lark, MD  albuterol (VENTOLIN HFA) 108 (90 Base) MCG/ACT inhaler Inhale 2 puffs into the lungs in the morning and at bedtime.    [provider]  amLODipine (NORVASC) 10 MG tablet TAKE 1 TABLET (10 MG TOTAL) BY MOUTH DAILY. Patient taking differently: Take 10 mg by mouth daily. 12/09/14   Heath Lark, MD  Azelastine HCl 0.15 % SOLN Place 2 sprays into both nostrils daily. 12/03/20   [provider]  bisoprolol (ZEBETA) 10 MG tablet Take 10 mg by mouth daily. 11/28/20   [provider]  citalopram  (CELEXA) 20 MG tablet Take 20 mg by mouth every morning.  11/11/10   Jonathon Jordan, MD  fluticasone-salmeterol (ADVAIR HFA) 270 095 2402 MCG/ACT inhaler Inhale 2 puffs into the lungs 2 (two) times daily.    [provider]  furosemide (LASIX) 20 MG tablet Take 1 tablet (20 mg total) by mouth daily. 12/09/20   Eugenie Filler, MD  gabapentin (NEURONTIN) 300 MG capsule Take 300 mg by mouth at bedtime.    [provider]  levocetirizine (XYZAL) 5 MG tablet Take 5 mg by mouth every evening.    [provider]  lidocaine-prilocaine (EMLA) cream Apply to affected area once Patient taking differently: Apply 1 application topically daily as needed (port access). 09/25/18   Heath Lark, MD  losartan (COZAAR) 100 MG tablet Take 100 mg by mouth daily. 11/03/20   [provider]  Multiple Vitamins-Minerals (PRESERVISION AREDS 2) CAPS Take 1 capsule by mouth 2 (two) times daily.    [provider]  ondansetron (ZOFRAN) 8 MG tablet Take 1 tablet (8 mg total) by mouth 2 (two) times daily. Start second day after chemotherapy. Then take as needed for nausea or vomiting. Patient taking differently: Take 8 mg by mouth every 8 (eight) hours as needed for nausea or vomiting. 02/06/20   Heath Lark, MD  pantoprazole (PROTONIX) 40 MG tablet Take 1 tablet (40 mg total) by mouth daily. 12/10/20   Eugenie Filler, MD  sulfamethoxazole-trimethoprim (BACTRIM DS) 800-160 MG tablet Take 1 tablet by mouth 3 (three) times a week. 02/06/20   Heath Lark, MD  Vitamin D, Ergocalciferol, 2000 units CAPS Take 2,000 Units by mouth daily.    [provider]    Allergies    Azithromycin and Ibuprofen  Review of Systems   Review of Systems  Unable to perform ROS: Other    Physical Exam Updated Vital Signs BP (!) 104/59   Pulse 69   Temp 98 F (36.7 C) (Oral)   Resp (!) 26   Ht 5\' 2"  (1.575 m)   Wt 99.8 kg   SpO2 95%   BMI 40.26 kg/m   Physical Exam Vitals and nursing  note reviewed.  Constitutional:      General: She is not in acute distress.    Appearance: She is well-developed. She is ill-appearing. She is not toxic-appearing or diaphoretic.  HENT:     Head: Normocephalic and atraumatic.     Right Ear: External ear normal.     Left Ear: External ear normal.  Eyes:     Conjunctiva/sclera: Conjunctivae normal.     Pupils: Pupils are equal, round, and reactive to light.  Neck:     Trachea: Phonation normal.  Cardiovascular:     Rate and Rhythm: Normal rate and regular rhythm.     Heart sounds: Normal heart sounds.  Pulmonary:     Effort: Pulmonary  effort is normal. No respiratory distress.     Breath sounds: No stridor. Rales present. No rhonchi.  Abdominal:     General: There is no distension.     Palpations: Abdomen is soft.     Tenderness: There is no abdominal tenderness.  Musculoskeletal:        General: Normal range of motion.     Cervical back: Normal range of motion and neck supple.     Right lower leg: Edema present.     Left lower leg: Edema present.     Comments: Trace to 1+ bilateral lower leg edema  Skin:    General: Skin is warm and dry.  Neurological:     Mental Status: She is alert and oriented to person, place, and time.     Cranial Nerves: No cranial nerve deficit.     Sensory: No sensory deficit.     Motor: No abnormal muscle tone.     Coordination: Coordination normal.  Psychiatric:        Mood and Affect: Mood normal.        Behavior: Behavior normal.        Thought Content: Thought content normal.        Judgment: Judgment normal.     ED Results / Procedures / Treatments   Labs (all labs ordered are listed, but only abnormal results are displayed) Labs Reviewed  COMPREHENSIVE METABOLIC PANEL - Abnormal; Notable for the following components:      Result Value   Glucose, Bld 112 (*)    Creatinine, Ser 1.29 (*)    Total Protein 5.6 (*)    AST 14 (*)    GFR, Estimated 43 (*)    All other components within  normal limits  CBC WITH DIFFERENTIAL/PLATELET - Abnormal; Notable for the following components:   WBC 144.5 (*)    RBC 3.23 (*)    Hemoglobin 9.0 (*)    HCT 32.3 (*)    MCHC 27.9 (*)    RDW 17.5 (*)    Platelets 144 (*)    All other components within normal limits  RESP PANEL BY RT-PCR (FLU A&B, COVID) ARPGX2  CULTURE, BLOOD (ROUTINE X 2)  CULTURE, BLOOD (ROUTINE X 2)  LACTIC ACID, PLASMA    EKG None   Date: 12/29/2020  Rate: 74  Rhythm: normal sinus rhythm  QRS Axis: normal  PR and QT Intervals: normal  ST/T Wave abnormalities: normal  PR and QRS Conduction Disutrbances:none      Radiology DG Chest Port 1 View  Result Date: 12/29/2020 CLINICAL DATA:  Shortness of breath and productive cough x1 week. EXAM: PORTABLE CHEST 1 VIEW COMPARISON:  Chest radiograph December 07, 2020 FINDINGS: Accessed right chest wall port with tip overlying the right atrium. The heart size and mediastinal contours are within unchanged. Nodular hilar contours similar to prior likely related to known bulky mediastinal adenopathy secondary to CLL. Bilateral patchy consolidations are again present with a new left midlung consolidation. Small right pleural effusion. The visualized skeletal structures are unchanged. IMPRESSION: 1. Bilateral patchy consolidations with new left midlung consolidation, findings suggestive of multifocal pneumonia. However pulmonary lymphoma slightly more suspicious on the differential in comparison prior imaging given persistence with interval increase in airspace opacities despite presumed treatment. 2. Diffuse interstitial opacities which is most likely related to pulmonary edema or an infectious process. However, in the setting of pulmonary lymphoma this would be suspicious for lymphangitic spread. 3. Small right pleural effusion increased compared to prior. Electronically  Signed   By: Dahlia Bailiff MD   On: 12/29/2020 13:58    Procedures .Critical Care Performed by: Daleen Bo, MD Authorized by: Daleen Bo, MD   Critical care provider statement:    Critical care time (minutes):  35   Critical care start time:  12/29/2020 12:54 PM   Critical care end time:  12/29/2020 4:07 PM   Critical care time was exclusive of:  Separately billable procedures and treating other patients   Critical care was necessary to treat or prevent imminent or life-threatening deterioration of the following conditions:  Respiratory failure   Critical care was time spent personally by me on the following activities:  Blood draw for specimens, development of treatment plan with patient or surrogate, discussions with consultants, evaluation of patient's response to treatment, examination of patient, obtaining history from patient or surrogate, ordering and performing treatments and interventions, ordering and review of laboratory studies, pulse oximetry, re-evaluation of patient's condition, review of old charts and ordering and review of radiographic studies     Medications Ordered in ED Medications  furosemide (LASIX) injection 40 mg (has no administration in time range)    ED Course  I have reviewed the triage vital signs and the nursing notes.  Pertinent labs & imaging results that were available during my care of the patient were reviewed by me and considered in my medical decision making (see chart for details).  Clinical Course as of 12/29/20 1553  Mon Dec 29, 2020  1424 I discussed case with Dr. Alvy Bimler, her oncologist, she is not comfortable with patient being discharged until her respiratory status has improved and/or stabilized. [EW]  1546 Weight today is 99.8 kg, which is marginally less than when she was discharged at 100 kg, 2 weeks ago. [EW]    Clinical Course User Index [EW] Daleen Bo, MD   MDM Rules/Calculators/A&P                           Patient Vitals for the past 24 hrs:  BP Temp Temp src Pulse Resp SpO2 Height Weight  12/29/20 1536 -- -- -- --  -- -- -- 99.8 kg  12/29/20 1530 (!) 104/59 -- -- 69 (!) 26 95 % -- --  12/29/20 1430 (!) 112/51 -- -- 69 18 97 % -- --  12/29/20 1400 (!) 114/48 -- -- 70 18 97 % -- --  12/29/20 1330 128/61 -- -- 72 (!) 21 97 % -- --  12/29/20 1300 (!) 114/55 -- -- 73 (!) 22 96 % -- --  12/29/20 1248 (!) 112/49 98 F (36.7 C) Oral 75 (!) 24 96 % -- --  12/29/20 1245 -- -- -- -- -- -- 5\' 2"  (1.575 m) 100 kg  12/29/20 1243 -- -- -- 79 -- 92 % -- --  12/29/20 1240 -- -- -- -- -- (!) 74 % -- --    3:53 PM Reevaluation with update and discussion. After initial assessment and treatment, an updated evaluation reveals no change in clinical status, findings discussed with patient and her sister at the bedside, all questions answered. Daleen Bo   Medical Decision Making:  This patient is presenting for evaluation of shortness of breath with hypoxia, which does require a range of treatment options, and is a complaint that involves a high risk of morbidity and mortality. The differential diagnoses include pneumonia, congestive heart failure. I decided to review old records, and in summary, female, recently hospitalized and  treated for pneumonia, multiple treatments for pneumonia the last 2 months, presenting now with hypoxia, required oxygen to support a normal oxygen saturation.  I obtained additional historical information from sister at bedside.  Clinical Laboratory Tests Ordered, included CBC, Metabolic panel and Lactate, blood culture. Review indicates elevated white count but improving, low hemoglobin, low platelets, elevated glucose, elevated creatinine, total protein low. Radiologic Tests Ordered, included chest x-ray.  I independently Visualized: Radiograph images, which show multifocal traits, nonspecific  Cardiac Monitor Tracing which shows normal sinus rhythm    Critical Interventions-clinical evaluation, laboratory testing, chest x-ray, oxygen to support normal oxygenation, observation and  reassessment  After These Interventions, the Patient was reevaluated and was found with hypoxia and abnormal chest x-ray.  Patient was hospitalized with multifocal pneumonia, and possible fluid overload, treated with parenteral antibiotics and diuresis.  She had improvement with diuresis, with discharge weight 97 kg, 1 month ago.  Systolic ejection fraction at that time was 60 to 65%, with grade 1 diastolic dysfunction.  He was discharged on Lasix.  She is being treated for chronic lymphocytic leukemia.  She has ongoing leukocytosis and pancytopenia.  White count is improving.  Weight is relatively stable, 1.8 kg increased from time of discharge.  Mild fluid overload suspected.  CRITICAL CARE-yes Performed by: Daleen Bo  Nursing Notes Reviewed/ Care Coordinated Applicable Imaging Reviewed Interpretation of Laboratory Data incorporated into ED treatment   3:40 PM-Consult complete with hospitalist. Patient case explained and discussed.  He agrees to admit patient for further evaluation and treatment. Call ended at 3:52 PM  Plan: Admit    Final Clinical Impression(s) / ED Diagnoses Final diagnoses:  Hypoxia  Pulmonary infiltrates  Acute on chronic diastolic congestive heart failure [facility])    Rx / DC Orders ED Discharge Orders    None       Daleen Bo, MD 12/29/20 1559    Daleen Bo, MD 12/29/20 [phone]

## 2020-12-29 NOTE — Plan of Care (Signed)
Care plan initiated.

## 2020-12-30 DIAGNOSIS — Z7189 Other specified counseling: Secondary | ICD-10-CM

## 2020-12-30 DIAGNOSIS — I5031 Acute diastolic (congestive) heart failure: Secondary | ICD-10-CM

## 2020-12-30 DIAGNOSIS — C9110 Chronic lymphocytic leukemia of B-cell type not having achieved remission: Secondary | ICD-10-CM

## 2020-12-30 DIAGNOSIS — J9601 Acute respiratory failure with hypoxia: Secondary | ICD-10-CM

## 2020-12-30 DIAGNOSIS — R918 Other nonspecific abnormal finding of lung field: Secondary | ICD-10-CM

## 2020-12-30 DIAGNOSIS — D61818 Other pancytopenia: Secondary | ICD-10-CM

## 2020-12-30 DIAGNOSIS — N183 Chronic kidney disease, stage 3 unspecified: Secondary | ICD-10-CM

## 2020-12-30 LAB — BASIC METABOLIC PANEL
Anion gap: 9 (ref 5–15)
BUN: 26 mg/dL — ABNORMAL HIGH (ref 8–23)
CO2: 29 mmol/L (ref 22–32)
Calcium: 7.9 mg/dL — ABNORMAL LOW (ref 8.9–10.3)
Chloride: 103 mmol/L (ref 98–111)
Creatinine, Ser: 1.37 mg/dL — ABNORMAL HIGH (ref 0.44–1.00)
GFR, Estimated: 40 mL/min — ABNORMAL LOW (ref >60)
Glucose, Bld: 108 mg/dL — ABNORMAL HIGH (ref 70–99)
Potassium: 4.6 mmol/L (ref 3.5–5.1)
Sodium: 141 mmol/L (ref 135–145)

## 2020-12-30 LAB — CBC
HCT: 30 % — ABNORMAL LOW (ref 36.0–46.0)
Hemoglobin: 8.3 g/dL — ABNORMAL LOW (ref 12.0–15.0)
MCH: 27.8 pg (ref 26.0–34.0)
MCHC: 27.7 g/dL — ABNORMAL LOW (ref 30.0–36.0)
MCV: 100.3 fL — ABNORMAL HIGH (ref 80.0–100.0)
Platelets: 143 10*3/uL — ABNORMAL LOW (ref 150–400)
RBC: 2.99 MIL/uL — ABNORMAL LOW (ref 3.87–5.11)
RDW: 17.7 % — ABNORMAL HIGH (ref 11.5–15.5)
WBC: 131.3 10*3/uL (ref 4.0–10.5)
nRBC: 0 % (ref 0.0–0.2)

## 2020-12-30 LAB — PROCALCITONIN: Procalcitonin: <0.1 ng/mL

## 2020-12-30 LAB — BRAIN NATRIURETIC PEPTIDE: B Natriuretic Peptide: 120.9 pg/mL — ABNORMAL HIGH (ref 0.0–100.0)

## 2020-12-30 MED ORDER — FUROSEMIDE 10 MG/ML IJ SOLN
40.0000 mg | Freq: Two times a day (BID) | INTRAMUSCULAR | Status: AC
  Administered 2020-12-30 – 2020-12-31 (×2): 40 mg via INTRAVENOUS
  Filled 2020-12-30 (×2): qty 4

## 2020-12-30 MED ORDER — FUROSEMIDE 10 MG/ML IJ SOLN
40.0000 mg | Freq: Every day | INTRAMUSCULAR | Status: DC
  Administered 2020-12-30: 40 mg via INTRAVENOUS
  Filled 2020-12-30: qty 4

## 2020-12-30 NOTE — Consult Note (Signed)
NAME:  Erin Moore, MRN:  [id number], DOB:  [birth year], LOS: 1 ADMISSION DATE:  12/29/2020, CONSULTATION DATE:  3/15 REFERRING MD:  Alvy Bimler, CHIEF COMPLAINT:  Abnormal cxr   History of Present Illness:  75 year old female who was admitted on 3/24 w/ about 1 week h/o  worsening SOB. Initial Pulse ox 74% on room air. WBC ct 144 K (felt 2/2 her acalabrutinib), RVP neg. CXR w/ diffuse patchy bilateral airspace disease. BNP 115. PCT <<0.10. started on ABX and IV diuresis.  PCCM asked to see 3/15 by oncology to consider bronchoscopy for atypical PNA the setting of immunocompromised state.  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 Started on Acalabrutinib 2021 Nov Pancytopenia  Diastolic HF, HTN  CKD stage III Admitted for HF Feb 2022  Significant Hospital Events:  3/14 admitted. PCT neg. BNP only in 100s. Bilateral patchy airspace disease. Got lasix. Started on cefepime. o2 needs 6lpm 3/15 I&O balance -1.1 liters. PCT still neg. Acyclovir added. PCCM consulted by Onc. Wanting FOB still on 6 liters   Consults:  ONC Pulm   Procedures:    Significant Diagnostic Tests:   Micro Data:  RVP: neg   Antimicrobials:  Cefepime 3/14 Acyclovir 3/15>>>  Interim History / Subjective:  Still reports shortness of breath with exertion and significantly concerned about loosening  Objective   Blood pressure (Abnormal) 100/51, pulse 61, temperature 97.6 F (36.4 C), temperature source Oral, resp. rate 18, height 5' 2" (1.575 m), weight 99.8 kg, SpO2 93 %.        Intake/Output Summary (Last 24 hours) at 12/30/2020 1058 Last data filed at 12/30/2020 1003 Gross per 24 hour  Intake 100 ml  Output 1250 ml  Net -1150 ml   Filed Weights   12/29/20 1245 12/29/20 1536  Weight: 100 kg 99.8 kg    Examination: General: Obese 75 year old white female currently sitting up in bed she is in no acute distress she is able to speak in complete  sentences HENT: Normocephalic atraumatic no JVD mucous membranes moist no thrush has marked upper airway wheezing Lungs: Diffuse wheezing, mix of both upper airway psuedo- wheeze and true wheezing with bibasilar rales.  Currently on 6 L/min saturations mid 90s no accessory use Cardiovascular: Regular rate and rhythm Abdomen: Soft not tender Extremities: Warm dry strong pulses trace lower extremity edema Neuro: Awake oriented GU: Voids  Resolved Hospital Problem list     Assessment & Plan:  CLL Chronic cough  Diastolic HF Pulmonary edema Acute hypoxic respiratory failure w/ patchy bilateral pulmonary infiltrates HTN  CKD stage III  Acute Hypoxic Respiratory Failure 2/2 recurrent diffuse pulmonary infiltrates.  -etiology not clear...certainly element of edema but don't think this explains everything w/ additional  ddx: pneumonia (in setting of immunocompromised state) would also consider aspiration given what seems to be chronic reflux related cough. Alternatively also consider lymphangitic spread or drug related pneumonitis  Plan Cont IV diuresis Repeat cxr in am Ck ESR  SLP eval for MBS and also get esophagram If Oxygen requirements down a little more but cxr not much improved we could then also consider FOB.     Best practice (evaluated daily)  Per primary   Labs   CBC: Recent Labs  Lab 12/29/20 1258 12/30/20 0319  WBC 144.5* 131.3*  NEUTROABS 6.0  --   HGB 9.0* 8.3*  HCT 32.3* 30.0*  MCV 100.0 100.3*  PLT 144* 143*    Basic Metabolic Panel:  Recent Labs  Lab 12/29/20 1258 12/30/20 0319  NA 141 141  K 4.9 4.6  CL 105 103  CO2 27 29  GLUCOSE 112* 108*  BUN 22 26*  CREATININE 1.29* 1.37*  CALCIUM 8.9 7.9*   GFR: Estimated Creatinine Clearance: 39.2 mL/min (A) (by C-G formula based on SCr of 1.37 mg/dL (H)). Recent Labs  Lab 12/29/20 1258 12/30/20 0319  PROCALCITON <0.10 <0.10  WBC 144.5* 131.3*  LATICACIDVEN 0.6  --     Liver Function  Tests: Recent Labs  Lab 12/29/20 1258  AST 14*  ALT 8  ALKPHOS 86  BILITOT 0.5  PROT 5.6*  ALBUMIN 3.5   No results for input(s): LIPASE, AMYLASE in the last 168 hours. No results for input(s): AMMONIA in the last 168 hours.  ABG No results found for: PHART, PCO2ART, PO2ART, HCO3, TCO2, ACIDBASEDEF, O2SAT   Coagulation Profile: No results for input(s): INR, PROTIME in the last 168 hours.  Cardiac Enzymes: No results for input(s): CKTOTAL, CKMB, CKMBINDEX, TROPONINI in the last 168 hours.  HbA1C: Hgb A1c MFr Bld  Date/Time Value Ref Range Status  02/14/2020 11:59 AM 5.5 4.8 - 5.6 % Final    Comment:    (NOTE) Pre diabetes:          5.7%-6.4% Diabetes:              >6.4% Glycemic control for   <7.0% adults with diabetes     CBG: No results for input(s): GLUCAP in the last 168 hours.  Review of Systems:   Review of Systems  Constitutional: Negative for fever, malaise/fatigue and weight loss.  HENT: Positive for congestion. Negative for sinus pain and sore throat.   Eyes: Negative.   Respiratory: Positive for cough, sputum production, shortness of breath and wheezing.        Cough has been chronic. Notes on-going for about 2 years. The cough has been worse at bedtime and much worse when flat. Denies heart burn. Does cough at times when eating   Cardiovascular: Positive for chest pain, palpitations and leg swelling.  Gastrointestinal: Negative for abdominal pain, heartburn, nausea and vomiting.  Genitourinary: Negative.   Musculoskeletal: Negative.   Skin: Negative.   Neurological: Negative.   Endo/Heme/Allergies: Negative.   Psychiatric/Behavioral: Negative.      Past Medical History:  She,  has a past medical history of Chronic lymphocytic leukemia (CLL), B-cell ([place]) (12/18/2013), CLL (chronic lymphocytic leukemia) ([place]) (FALL OF 2011), CLL (chronic lymphocytic leukemia) ([place]) (06/02/2015), Depression, Fatigue (07/18/2013), Hyperlipemia, Hypertension, Learning  disorder, Mental disability, Osteoarthritis, Pharyngitis (09/18/2013), and Upper respiratory symptom (01/2018).   Surgical History:   Past Surgical History:  Procedure Laterality Date  . ABDOMINAL HYSTERECTOMY    . BREAST BIOPSY Left 2011  . BREAST REDUCTION SURGERY    . CARPAL TUNNEL RELEASE     BILATERAL  . CESAREAN SECTION    . IR FLUORO GUIDE PORT INSERTION RIGHT  01/17/2018  . IR US GUIDE VASC ACCESS RIGHT  01/17/2018  . REDUCTION MAMMAPLASTY Bilateral 1969  . REPLACEMENT TOTAL KNEE     LEFT KNEE  . TONSILLECTOMY    . TUBAL LIGATION  1975   BILATERAL     Social History:   reports that she has never smoked. She has never used smokeless tobacco. She reports that she does not drink alcohol and does not use drugs.   Family History:  Her family history includes Breast cancer in her mother.   Allergies Allergies  Allergen Reactions  .  Azithromycin Hives    Hives and fever  . Ibuprofen Other (See Comments) and Swelling    Hives     Home Medications  Prior to Admission medications   Medication Sig Start Date End Date Taking? Authorizing Provider  acalabrutinib (CALQUENCE) 100 MG capsule Take 1 capsule (100 mg total) by mouth 2 (two) times daily. 12/10/20  Yes Eugenie Filler, MD  acetaminophen (TYLENOL) 325 MG tablet Take 650 mg by mouth every 6 (six) hours as needed for mild pain.   Yes [provider]  acyclovir (ZOVIRAX) 400 MG tablet Take 1 tablet (400 mg total) by mouth daily. 02/05/20  Yes Gorsuch, Ni, MD  albuterol (VENTOLIN HFA) 108 (90 Base) MCG/ACT inhaler Inhale 2 puffs into the lungs in the morning and at bedtime.   Yes [provider]  allopurinol (ZYLOPRIM) 300 MG tablet Take 300 mg by mouth daily.   Yes [provider]  amLODipine (NORVASC) 10 MG tablet TAKE 1 TABLET (10 MG TOTAL) BY MOUTH DAILY. Patient taking differently: Take 10 mg by mouth daily. 12/09/14  Yes Gorsuch, Ni, MD  Azelastine HCl 0.15 % SOLN Place 2 sprays into both  nostrils daily. 12/03/20  Yes [provider]  bisoprolol (ZEBETA) 10 MG tablet Take 10 mg by mouth daily. 11/28/20  Yes [provider]  citalopram (CELEXA) 20 MG tablet Take 20 mg by mouth every morning.  11/11/10  Yes Jonathon Jordan, MD  fluticasone-salmeterol (ADVAIR HFA) [phone] MCG/ACT inhaler Inhale 2 puffs into the lungs 2 (two) times daily.   Yes [provider]  furosemide (LASIX) 20 MG tablet Take 1 tablet (20 mg total) by mouth daily. 12/09/20  Yes Eugenie Filler, MD  gabapentin (NEURONTIN) 300 MG capsule Take 300 mg by mouth at bedtime.   Yes [provider]  guaiFENesin (MUCINEX) 600 MG 12 hr tablet Take 1,200 mg by mouth 2 (two) times daily.   Yes [provider]  levocetirizine (XYZAL) 5 MG tablet Take 5 mg by mouth every evening.   Yes [provider]  lidocaine-prilocaine (EMLA) cream Apply to affected area once Patient taking differently: Apply 1 application topically daily as needed (port access). 09/25/18  Yes Gorsuch, Ni, MD  loratadine (CLARITIN) 10 MG tablet Take 10 mg by mouth daily.   Yes [provider]  losartan (COZAAR) 100 MG tablet Take 100 mg by mouth daily. 11/03/20  Yes [provider]  Multiple Vitamins-Minerals (PRESERVISION AREDS 2) CAPS Take 1 capsule by mouth 2 (two) times daily.   Yes [provider]  ondansetron (ZOFRAN) 8 MG tablet Take 1 tablet (8 mg total) by mouth 2 (two) times daily. Start second day after chemotherapy. Then take as needed for nausea or vomiting. Patient taking differently: Take 8 mg by mouth every 8 (eight) hours as needed for nausea or vomiting. 02/06/20  Yes Gorsuch, Ni, MD  pantoprazole (PROTONIX) 40 MG tablet Take 1 tablet (40 mg total) by mouth daily. 12/10/20  Yes Eugenie Filler, MD  sulfamethoxazole-trimethoprim (BACTRIM DS) 800-160 MG tablet Take 1 tablet by mouth 3 (three) times a week. Patient taking differently: Take 1 tablet by mouth 3 (three)  times a week. Mon, Wed, Fri. 02/06/20  Yes Heath Lark, MD  Vitamin D, Ergocalciferol, 2000 units CAPS Take 2,000 Units by mouth daily.   Yes [provider]     Critical care time: NA    Erick Colace ACNP-BC [place] Pager # [phone] OR # [phone] if no  answer

## 2020-12-30 NOTE — Progress Notes (Signed)
PROGRESS NOTE  Erin Moore  DOB: Dec 12, 1945  PCP: Jonathon Jordan, MD NID:[id number]  DOA: 12/29/2020  LOS: 1 day   Chief complaint: Shortness of breath.  Brief narrative: Erin Moore is a 75 y.o. female with PMH significant for HTN, HLD, CLL, CKD 3, diastolic CHF, not on supplemental oxygen at baseline. She was last hospitalized from 2/16 to 2/22 for multifocal pneumonia, diastolic CHF exacerbation treated with antibiotics, diuresed with Lasix and discharged to ALF without oxygen. Patient follows up with Dr. Alvy Bimler as an outpatient for CLL. Patient is chronically on acalabrutinib for CLL.  She has leukocytosis as a side effect of the immunotherapy.  Last seen by oncologist on 12/17/2020. Patient presented to the ED today [facility] with shortness of breath for 1 week with productive cough, O2 sat 74% on room air.  In the ED, patient was afebrile, heart rate in 60s, blood pressure in low normal range.  Oxygen saturation more than 95% on 6 L by nasal cannula. Labs with WBC count elevated to 144,000, hemoglobin at 9, Lactic acid level normal Respiratory virus panel negative. Chest x-ray findings as below bilateral patchy consolidations with new left midlung consolidation, findings suggestive of multifocal pneumonia. However findings could also be because of lymphangitic spread of pulmonary lymphoma, edema or infection.  Patient was admitted to hospitalist service Oncology consultation was obtained.  Subjective: Patient was seen and examined this morning.  Elderly Caucasian female.  Propped up in bed.  Audibly wheezing.  On 6 L oxygen.  Per RN, she just got back to bed from bathroom.  Was not in distress earlier. Oncology consult appreciated.  Assessment/Plan: Acute respiratory failure with hypoxia -Presented with progressively worsening shortness of breath, cough, hypoxia  -possible etiologies: Multifocal pneumonia versus CHF exacerbation versus lymphangitic spread of  lymphoma. -Currently on diuresis with IV Lasix 40 mg daily. -Not on supplemental oxygen at home.  Currently requiring 6 L by nasal cannula.  Wean down as tolerated  Multifocal pneumonia -No fever, WBC count always elevated.  Based on CT scan report and immunocompromised status, multifocal pneumonia is also a consideration.   -Currently on IV cefepime.  Procalcitonin level less than 0.10 however. -Speech therapy evaluation to rule out dysphagia and aspiration pneumonia Recent Labs  Lab 12/29/20 1258 12/30/20 0319  WBC 144.5* 131.3*  LATICACIDVEN 0.6  --   PROCALCITON <0.10 <0.10   Acute exacerbation of congestive heart failure Essential hypertension -Recent echocardiogram with EF 60 to 14%, grade 1 diastolic dysfunction -On Lasix 20 mg daily.  Has shortness of breath, wheezing, mild bilateral pedal edema. -Lasix 40 mg IV daily at this time.  Continue bisoprolol.  Keep losartan and amlodipine on hold. -Net IO Since Admission: -1,850 mL [12/30/20 1335] -Continue to monitor for daily intake output, weight, blood pressure, BNP, renal function and electrolytes. 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 Alvy Bimler as an outpatient for CLL. Patient is chronically on acalabrutinib for CLL.  She has leukocytosis as a side effect of the immunotherapy.  Last seen by oncologist on 12/17/2020. -Defer to oncology for the concern of pulmonary lymphangitic spread. -Keep acalabrutinib on hold.  Continue acyclovir, Bactrim as before  CKD stage IIIa -Creatinine remains at baseline.  Continue to monitor Recent Labs    12/03/20 0950 12/03/20 2034 12/04/20 0313 12/05/20 0310  12/06/20 0400 12/07/20 0306 12/08/20 0310 12/09/20 0335 12/17/20 1037 12/29/20 1258 12/30/20 0319  BUN 22  --  25* 32* 35* 36* 42* 43* 21 22 26*   CREATININE 1.55* 1.78* 1.39* 1.11* 1.06* 0.97 1.08* 1.19* 1.42* 1.29* 1.37*   Mobility: Encourage ambulation Code Status:   Code Status: Full Code  Nutritional status: Body mass index is 40.26 kg/m.     Diet Order            Diet Heart Room service appropriate? Yes; Fluid consistency: Thin  Diet effective now                 DVT prophylaxis: heparin injection 5,000 Units Start: 12/29/20 2200   Antimicrobials:  IV cefepime Fluid: None Consultants: Oncology, PCCM Family Communication:  None at bedside  Status is: Inpatient  Remains inpatient appropriate because: Remains on respiratory distress  Dispo: The patient is from: Home              Anticipated d/c is to: Home              Patient currently is not medically stable to d/c.   Difficult to place patient No       Infusions:  . ceFEPime (MAXIPIME) IV 2 g (12/30/20 0513)    Scheduled Meds: . acyclovir  400 mg Oral Daily  . albuterol  2.5 mg Nebulization Q6H  . bisoprolol  10 mg Oral Daily  . Chlorhexidine Gluconate Cloth  6 each Topical Daily  . citalopram  20 mg Oral q morning  . furosemide  40 mg Intravenous Daily  . gabapentin  300 mg Oral QHS  . heparin  5,000 Units Subcutaneous Q8H  . pantoprazole  40 mg Oral Daily  . [START ON 12/31/2020] sulfamethoxazole-trimethoprim  1 tablet Oral Once per day on Mon Wed Fri    Antimicrobials: Anti-infectives (From admission, onward)   Start     Dose/Rate Route Frequency Ordered Stop   12/31/20 1000  sulfamethoxazole-trimethoprim (BACTRIM DS) 800-160 MG per tablet 1 tablet        1 tablet Oral Once per day on Mon Wed Fri 12/29/20 1722     12/30/20 1000  acyclovir (ZOVIRAX) tablet 400 mg        400 mg Oral Daily 12/29/20 1722     12/29/20 1800  ceFEPIme (MAXIPIME) 2 g in sodium chloride 0.9 % 100 mL IVPB        2 g 200 mL/hr over 30 Minutes Intravenous Every 12 hours 12/29/20 1726        PRN meds: acetaminophen **OR** acetaminophen, bisacodyl, hydrALAZINE,  morphine injection, oxyCODONE, polyethylene glycol, sodium chloride flush, sodium phosphate   Objective: Vitals:   12/30/20 0522 12/30/20 0806  BP: (!) 100/51   Pulse: 61   Resp: 18   Temp: 97.6 F (36.4 C)   SpO2: 90% 93%    Intake/Output Summary (Last 24 hours) at 12/30/2020 1327 Last data filed at 12/30/2020 1323 Gross per 24 hour  Intake 100 ml  Output 1950 ml  Net -1850 ml   Filed Weights   12/29/20 1245 12/29/20 1536  Weight: 100 kg 99.8 kg   Weight change:  Body mass index is 40.26 kg/m.   Physical Exam: General exam: Pleasant, elderly Caucasian female.  Mild respiratory distress Skin: No rashes, lesions or ulcers. HEENT: Atraumatic, normocephalic, no obvious bleeding Lungs: Audible wheezing bilaterally.  Bilateral scattered crackles CVS: Regular rate and rhythm, no murmur GI/Abd soft, nontender, nondistended, bowel sound present CNS:  Alert, awake, oriented x3 Psychiatry: Mood appropriate Extremities: Trace bilateral pedal edema, no calf tenderness  Data Review: I have personally reviewed the laboratory data and studies available.  Recent Labs  Lab 12/29/20 1258 12/30/20 0319  WBC 144.5* 131.3*  NEUTROABS 6.0  --   HGB 9.0* 8.3*  HCT 32.3* 30.0*  MCV 100.0 100.3*  PLT 144* 143*   Recent Labs  Lab 12/29/20 1258 12/30/20 0319  NA 141 141  K 4.9 4.6  CL 105 103  CO2 27 29  GLUCOSE 112* 108*  BUN 22 26*  CREATININE 1.29* 1.37*  CALCIUM 8.9 7.9*    F/u labs ordered Unresulted Labs (From admission, onward)          Start     Ordered   12/30/20 0813  Basic metabolic panel  Daily,   R      12/29/20 1711   12/30/20 0500  CBC  Daily,   R      12/29/20 1711   12/30/20 0500  Procalcitonin  Daily,   R      12/29/20 1707   12/30/20 0500  Brain natriuretic peptide  Daily,   R      12/29/20 1707          Signed, Terrilee Croak, MD [facility] 12/30/2020

## 2020-12-30 NOTE — Consult Note (Signed)
Cardiology Consultation:   Patient ID: SHYENNE MAGGARD MRN: [id number]; DOB: [birth year]  Admit date: 12/29/2020 Date of Consult: 12/30/2020  PCP:  Jonathon Jordan, [place]  Cardiologist: New  Patient Profile:   Erin Moore is a 76 y.o. female with a hx of CLL, HTN, chronic diastolic heart failure, CKD stage III and pancytopenia who is being seen today for the evaluation of CHF at the request of Dr. Pietro Cassis.   Hx of CLL dx in 2011 s/p chemo, relapsed in 2013, Acalabrutinib started 08/2020,  Admitted February 2022 for acute hypoxic respiratory failure secondary to volume overload and multifocal pneumonia.  Diurese at least 10 L.  Discharge weight 97 kg.  Treated with broad-spectrum antibiotic.  Echo with LV function of 60 to 65% and grade 1 diastolic dysfunction.  Discharged on Lasix 20 mg daily.  Seen by Dr. Alvy Bimler 12/17/2020.  Felt elevated WBC/leukocytosis secondary to side effect of acalabrutinib.   History of Present Illness:   Erin Moore presented to [facility] ER from [facility] with 1 week history of progressive worsening productive cough and shortness of breath.  She one-pound hypoxic.  Oxygen saturation improved with supplemental oxygen.  Leukocytosis with WBC of 144,000.  Troponin IX.  Respiratory panel negative.  Chest x-ray concerning for multifocal pneumonia with lymphangitic spread of pulmonary lymphoma, edema or infection.  Patient was seen by Dr. Alvy Bimler who recommended PCCM evaluation for possible bronchoscopy given immunocompromised status of patient and high risk for atypical infection given abnormal chest x-ray.  Started on IV lasix 40mg  daily. Diuresed 1.8L. weight 100>>99.8 kg.  Non smoker. Reports shortness of breath with activity. No exertional chest tightness. Watches her diet at facility.  BNP 155>>120 Scr 1.29>>1.37  Past Medical History:  Diagnosis Date  . Chronic lymphocytic leukemia (CLL), B-cell ([place]) 12/18/2013  .  CLL (chronic lymphocytic leukemia) ([place]) FALL OF 2011  . CLL (chronic lymphocytic leukemia) ([place]) 06/02/2015  . Depression   . Fatigue 07/18/2013  . Hyperlipemia   . Hypertension   . Learning disorder   . Mental disability   . Osteoarthritis   . Pharyngitis 09/18/2013  . Upper respiratory symptom 01/2018    Past Surgical History:  Procedure Laterality Date  . ABDOMINAL HYSTERECTOMY    . BREAST BIOPSY Left 2011  . BREAST REDUCTION SURGERY    . CARPAL TUNNEL RELEASE     BILATERAL  . CESAREAN SECTION    . IR FLUORO GUIDE PORT INSERTION RIGHT  01/17/2018  . IR US GUIDE VASC ACCESS RIGHT  01/17/2018  . REDUCTION MAMMAPLASTY Bilateral 1969  . REPLACEMENT TOTAL KNEE     LEFT KNEE  . TONSILLECTOMY    . TUBAL LIGATION  1975   BILATERAL     Inpatient Medications: Scheduled Meds: . acyclovir  400 mg Oral Daily  . albuterol  2.5 mg Nebulization Q6H  . bisoprolol  10 mg Oral Daily  . Chlorhexidine Gluconate Cloth  6 each Topical Daily  . citalopram  20 mg Oral q morning  . furosemide  40 mg Intravenous Daily  . gabapentin  300 mg Oral QHS  . heparin  5,000 Units Subcutaneous Q8H  . pantoprazole  40 mg Oral Daily  . [START ON 12/31/2020] sulfamethoxazole-trimethoprim  1 tablet Oral Once per day on Mon Wed Fri   Continuous Infusions: . ceFEPime (MAXIPIME) IV 2 g (12/30/20 0513)   PRN Meds: acetaminophen **OR** acetaminophen, bisacodyl, hydrALAZINE, morphine injection, oxyCODONE, polyethylene glycol, sodium chloride flush,  sodium phosphate  Allergies:    Allergies  Allergen Reactions  . Azithromycin Hives    Hives and fever  . Ibuprofen Other (See Comments) and Swelling    Hives    Social History:   Social History   Socioeconomic History  . Marital status: Divorced    Spouse name: Not on file  . Number of children: Not on file  . Years of education: Not on file  . Highest education level: Not on file  Occupational History  . Not on file  Tobacco Use  . Smoking status:  Never Smoker  . Smokeless tobacco: Never Used  Substance and Sexual Activity  . Alcohol use: No  . Drug use: No  . Sexual activity: Not on file  Other Topics Concern  . Not on file  Social History Narrative  . Not on file   Social Determinants of Health   Financial Resource Strain: Not on file  Food Insecurity: Not on file  Transportation Needs: Not on file  Physical Activity: Not on file  Stress: Not on file  Social Connections: Not on file  Intimate Partner Violence: Not on file    Family History:   Family History  Problem Relation Age of Onset  . Breast cancer Mother      ROS:  Please see the history of present illness.  All other ROS reviewed and negative.     Physical Exam/Data:   Vitals:   12/29/20 2044 12/30/20 0129 12/30/20 0522 12/30/20 0806  BP: (!) 106/50 (!) 107/45 (!) 100/51   Pulse: 72 62 61   Resp: 20 (!) 22 18   Temp: 98.3 F (36.8 C) 97.9 F (36.6 C) 97.6 F (36.4 C)   TempSrc: Oral Oral Oral   SpO2: 97% 97% 90% 93%  Weight:      Height:        Intake/Output Summary (Last 24 hours) at 12/30/2020 1302 Last data filed at 12/30/2020 1100 Gross per 24 hour  Intake 100 ml  Output 1550 ml  Net -1450 ml   Last 3 Weights 12/29/2020 12/29/2020 12/17/2020  Weight (lbs) 220 lb 2 oz 220 lb 7.4 oz 221 lb 12.8 oz  Weight (kg) 99.848 kg 100 kg 100.608 kg     Body mass index is 40.26 kg/m.  General:  Well nourished, well developed, in no acute distress HEENT: normal Lymph: no adenopathy Neck: no JVD Endocrine:  No thryomegaly Vascular: No carotid bruits; FA pulses 2+ bilaterally without bruits  Cardiac:  normal S1, S2; RRR; no murmur  Lungs:  Diffuse wheezing, bibasilar rales  Abd: soft, nontender, no hepatomegaly  Ext: no edema Musculoskeletal:  No deformities, BUE and BLE strength normal and equal Skin: warm and dry  Neuro:  CNs 2-12 intact, no focal abnormalities noted Psych:  Normal affect   EKG:  The EKG was personally reviewed and  demonstrates:  NSR Telemetry:  Telemetry was personally reviewed and demonstrates:  SR  Relevant CV Studies:  Echo 11/2020 1. Left ventricular ejection fraction, by estimation, is 60 to 65%. The  left ventricle has normal function. The left ventricle has no regional  wall motion abnormalities. Left ventricular diastolic parameters are  consistent with Grade I diastolic  dysfunction (impaired relaxation).  2. Right ventricular systolic function is normal. The right ventricular  size is normal.  3. Mild mitral valve regurgitation  4. inferior vena cava is normal in size with greater than 50% respiratory  variability, suggesting right atrial pressure of 3 mmHg.  Laboratory Data:  High Sensitivity Troponin:  No results for input(s): TROPONINIHS in the last 720 hours.   Chemistry Recent Labs  Lab 12/29/20 1258 12/30/20 0319  NA 141 141  K 4.9 4.6  CL 105 103  CO2 27 29  GLUCOSE 112* 108*  BUN 22 26*  CREATININE 1.29* 1.37*  CALCIUM 8.9 7.9*  GFRNONAA 43* 40*  ANIONGAP 9 9    Recent Labs  Lab 12/29/20 1258  PROT 5.6*  ALBUMIN 3.5  AST 14*  ALT 8  ALKPHOS 86  BILITOT 0.5   Hematology Recent Labs  Lab 12/29/20 1258 12/30/20 0319  WBC 144.5* 131.3*  RBC 3.23* 2.99*  HGB 9.0* 8.3*  HCT 32.3* 30.0*  MCV 100.0 100.3*  MCH 27.9 27.8  MCHC 27.9* 27.7*  RDW 17.5* 17.7*  PLT 144* 143*   BNP Recent Labs  Lab 12/29/20 1258 12/30/20 0319  BNP 155.3* 120.9*    DDimer No results for input(s): DDIMER in the last 168 hours.   Radiology/Studies:  DG Chest Port 1 View  Result Date: 12/29/2020 CLINICAL DATA:  Shortness of breath and productive cough x1 week. EXAM: PORTABLE CHEST 1 VIEW COMPARISON:  Chest radiograph December 07, 2020 FINDINGS: Accessed right chest wall port with tip overlying the right atrium. The heart size and mediastinal contours are within unchanged. Nodular hilar contours similar to prior likely related to known bulky mediastinal adenopathy  secondary to CLL. Bilateral patchy consolidations are again present with a new left midlung consolidation. Small right pleural effusion. The visualized skeletal structures are unchanged. IMPRESSION: 1. Bilateral patchy consolidations with new left midlung consolidation, findings suggestive of multifocal pneumonia. However pulmonary lymphoma slightly more suspicious on the differential in comparison prior imaging given persistence with interval increase in airspace opacities despite presumed treatment. 2. Diffuse interstitial opacities which is most likely related to pulmonary edema or an infectious process. However, in the setting of pulmonary lymphoma this would be suspicious for lymphangitic spread. 3. Small right pleural effusion increased compared to prior. Electronically Signed   By: Dahlia Bailiff MD   On: 12/29/2020 13:58     Assessment and Plan:   1. Acute on chronic hypoxic respiratory failure 2. Abnormal CXR with pulmonary infirates 3. Chronic diastolic CHF 4. Acute on CKD III (baseline Scr around 1.1)  Diuresed 10L last admission. Discharged weight was 97kg.  Echo 12/08/20 with LVEF of 60-65%, no WM abnormality and grade 1 DD.  Now presenting with similar symptoms. BNP eccentially normal at 155>>120. No LE edema. Rales on lung exam. Given cough suspects aspiration pneumonia.  Not lung etiology > heart. Scr bumped to 1.37 from 1.29 with diuresis. Diuresed 1.8L. weight 100>>99.8 kg.    Dr. Radford Pax to see.   Risk Assessment/Risk Scores:    New York Heart Association (NYHA) Functional Class NYHA Class II   For questions or updates, please contact [facility] Please consult [email or web address] for contact info under    Jarrett Soho, PA  12/30/2020 1:02 PM

## 2020-12-30 NOTE — Progress Notes (Addendum)
[place] OFFICE PROGRESS NOTE  I have seen the patient, examined her and agree with documentation as follows and I also updated the family members about the plan of care  Patient Care Team: Jonathon Jordan, MD as PCP - General (Family Medicine) Heath Lark, MD as Referring Physician (Hematology and Oncology)  ASSESSMENT & PLAN:  CLL (chronic lymphocytic leukemia) (Erin Moore) She has persistent leukocytosis but this is not unexpected side effects from acalabrutinib Treatment currently on hold Overall, clinically, she appears to be responding to treatment  Acute respiratory failure with hypoxia Presented with worsening shortness of breath, cough, hypoxia Chest x-ray suggestive of multifocal pneumonia versus CHF exacerbation versus lymphangitic spread of lymphoma In my opinion, this is not consistent with diagnosis of lymphoma/CLL She had numerous courses of antibiotics in the past From the prior hospitalization, she diuresed almost 11 L of fluid out and certainly volume overload could give similar picture Continue antibiotics for possible pneumonia per hospitalist Recommend aggressive diuresis Recommend pulmonology consult as this patient is immunocompromised and at high risk for atypical infection -May benefit from bronchoscopy this admission. I have requested consult from Pulmonology.    Pancytopenia, acquired [facility]) The cause of her pancytopenia is multifactorial  Monitor   Acute diastolic CHF (congestive heart failure) ([place]) Last echocardiogram was performed on 12/08/2020 which showed an LVEF of 60 to 65% Respiratory status improved significantly with aggressive diuresis last admission and she lost about 10 pounds Continue diuresis per hospitalist   CKD (chronic kidney disease), symptom management only, stage 3 (moderate) ([place]) She has slight elevated serum creatinine from baseline but likely due to diuretic therapy For now, I recommend she continues the same  Goals  of care  Due to the patient's mild baseline mental deficit, her sister/HC POA makes all of her medical decisions I have updated her sister with the plan of care and she agreed with the plan of care  Discharge planning Anticipate she will be hospitalized for at least 3 to 4 days Recommend pulmonology consult this admission as well as cardiologist to comment on her diastolic dysfunction and tendency for fluid retention Will need improvement of her hypoxia and overall respiratory status before discharge can be considered I will return to check on her tomorrow  All questions were answered. The patient knows to call the clinic with any problems, questions or concerns.   Mikey Bussing, NP 12/30/2020 8:01 AM Heath Lark, MD  INTERVAL HISTORY: The patient is followed by Erin Moore for CLL and has been receiving treatment with acalabrutinib Now admitted for acute respiratory failure with hypoxia Chest x-ray suggestive of multifocal pneumonia versus CHF exacerbation versus lymphangitic spread of lymphoma Was hospitalized for similar episode 12/03/2020 through 12/09/2020 when she was treated with antibiotics and aggressively diuresed The patient is not having any fevers or chills She remains short of breath this morning and is on 6 L of O2 via nasal cannula She is receiving IV antibiotics as well as IV Lasix Acalabrutinib is on hold. I just saw her over a week ago and at the time, her oxygen saturation is stable and she does not have wheezes or hypoxemic respiratory failure  SUMMARY OF ONCOLOGIC HISTORY: Oncology History Overview Note  FISH del 13 q   CLL (chronic lymphocytic leukemia) (Erin Moore)  07/02/2010 Procedure   LN biopsy confirmed CLL   08/18/2010 Procedure   Bone marrow biopsy confirmed CLL   11/18/2010 - 04/15/2011 Chemotherapy   patient received 6 cycles of FLudarabine and Rituximab   01/13/2012  Relapse/Recurrence   Repeat BM biopsy confirmed relapse   01/27/2012 - 06/23/2012 Chemotherapy    Bendamustine & Rituximab given, complicated by infusion reaction to Rituximab, completed 6 cycles   06/29/2013 Imaging   Ct scan confirmed disease relapse with bulky lymphadenopathy   07/23/2013 Procedure   Repeat BM biopsy due to thrombocytopenia and progressive leukocytosis   08/02/2013 Relapse/Recurrence   Patient consented to start iburitinib as salvage therapy for relapsed CLL   11/09/2013 Imaging   Repeat CT scan the chest, abdomen and pelvis showed greater than 50% response to treatment   06/17/2014 Imaging   Repeat CT scan showed near complete response to treatment.   05/30/2015 Imaging   Repeat CT scan showed near complete response to treatment.   06/14/2016 Imaging   CT scan showed stable scattered small retroperitoneal lymph nodes and pelvic lymph nodes. No findings for recurrent or progressive lymphoma. Stable mild splenomegaly.   12/30/2017 Imaging   1. Recurrent adenopathy within the chest, abdomen, and pelvis. 2. Splenomegaly. 3.  Aortic Atherosclerosis (ICD10-I70.0). 4. Coronary artery calcifications.   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 The catheter is ready for immediate use.   01/24/2018 -  Chemotherapy   The patient had Gazyva and 1 dose of bendamustine. Bendamustine was discontinued due to inability to get insurance approval   05/08/2018 Imaging   1. Interval partial treatment response. Bilateral axillary, mediastinal, right hilar, retroperitoneal and bilateral pelvic adenopathy is all decreased. Mild splenomegaly, decreased. No new or progressive disease. 2.  Aortic Atherosclerosis (ICD10-I70.0).   01/31/2020 Imaging   1. Since 12/29/2017, marked progression of adenopathy within the chest, abdomen, and pelvis as detailed above. 2. Progressive splenomegaly, consistent with splenic involvement. 3. New  pulmonary parenchymal findings which could represent interval infection (including atypical etiologies) or aspiration. 4. Coronary artery atherosclerosis. Aortic Atherosclerosis (ICD10-I70.0).   02/07/2020 - 07/22/2020 Chemotherapy   The patient bendamustine for chemotherapy treatment.     04/28/2020 Imaging   Chest Impression:   1. New consolidation in the LEFT upper lobe is concerning for pneumonia versus lymphoma. Recommend clinical correlation with pulmonary infection. Favor recurrent pulmonary infection in light of RIGHT lung infection demonstrated on CT January 31, 2020. 2. Resolution of ground-glass densities in the RIGHT upper lobe and consolidation in the RIGHT lower lobe consistent resolved pulmonary infection. 3. Persistent enlarged axillary, mediastinal, and hilar lymph nodes.  Overall adenopathy is mildly improved.   Abdomen / Pelvis Impression:   1. Improved bulky retroperitoneal and iliac lymphadenopathy. The large lymph nodes are measurably decreased in size although remain bulky. 2. Marked splenomegaly also improved.   08/11/2020 Imaging   1. Disease progression, as evidenced by increase in adenopathy within the chest, abdomen, and pelvis. Persistent hepatosplenomegaly, with probable splenic involvement. 2. Although the left upper lobe consolidation has resolved, there are new areas of pulmonary opacity which could represent pulmonary lymphoma or concurrent pneumonia. 3. Coronary artery atherosclerosis. Aortic Atherosclerosis   08/18/2020 -  Chemotherapy   The patient had acalabrutinib for chemotherapy treatment.     12/03/2020 Imaging   1. No acute pulmonary embolism. 2. New bilateral patchy, nodular and consolidative airspace disease most consistent multifocal pneumonia including atypical pneumonia. Secondary differential would include a pulmonary lymphoma, less favored. 3. Bulky mediastinal and axillary lymphadenopathy related to CLL. No change from prior. 4.  Splenomegaly and upper abdominal adenopathy  related to CLL.     12/03/2020 - 12/09/2020 Hospital Admission   She was admitted to the hospital for hypoxemic respiratory failure.  Infection was ruled out.  The patient has diastolic heart dysfunction causing fluid retention and coughing.  She was subsequently discharged     REVIEW OF SYSTEMS:   Constitutional: Denies fevers, chills or abnormal weight loss Eyes: Denies blurriness of vision Ears, nose, mouth, throat, and face: Denies mucositis or sore throat Cardiovascular: Denies palpitation, chest discomfort or lower extremity swelling Gastrointestinal:  Denies nausea, heartburn or change in bowel habits Skin: Denies abnormal skin rashes Lymphatics: Denies new lymphadenopathy or easy bruising Neurological:Denies numbness, tingling or new weaknesses Behavioral/Psych: Mood is stable, no new changes  All other systems were reviewed with the patient and are negative.  I have reviewed the past medical history, past surgical history, social history and family history with the patient and they are unchanged from previous note.  ALLERGIES:  is allergic to azithromycin and ibuprofen.  MEDICATIONS:  Current Facility-Administered Medications  Medication Dose Route Frequency Provider Last Rate Last Admin   acetaminophen (TYLENOL) tablet 650 mg  650 mg Oral Q6H PRN Dahal, Marlowe Aschoff, MD       Or   acetaminophen (TYLENOL) suppository 650 mg  650 mg Rectal Q6H PRN Dahal, Marlowe Aschoff, MD       acyclovir (ZOVIRAX) tablet 400 mg  400 mg Oral Daily Dahal, Binaya, MD       albuterol (PROVENTIL) (2.5 MG/3ML) 0.083% nebulizer solution 2.5 mg  2.5 mg Nebulization Q6H Dahal, Binaya, MD   2.5 mg at 12/30/20 0134   bisacodyl (DULCOLAX) EC tablet 5 mg  5 mg Oral Daily PRN Dahal, Marlowe Aschoff, MD       bisoprolol (ZEBETA) tablet 10 mg  10 mg Oral Daily Dahal, Binaya, MD   10 mg at 12/29/20 2129   ceFEPIme (MAXIPIME) 2 g in sodium chloride 0.9 % 100 mL IVPB  2 g Intravenous Q12H  Pham, Anh P, RPH 200 mL/hr at 12/30/20 0513 2 g at 12/30/20 6301   Chlorhexidine Gluconate Cloth 2 % PADS 6 each  6 each Topical Daily Dahal, Marlowe Aschoff, MD   6 each at 12/29/20 2134   citalopram (CELEXA) tablet 20 mg  20 mg Oral q morning Dahal, Marlowe Aschoff, MD       furosemide (LASIX) injection 40 mg  40 mg Intravenous Daily Dahal, Binaya, MD       gabapentin (NEURONTIN) capsule 300 mg  300 mg Oral QHS Dahal, Marlowe Aschoff, MD   300 mg at 12/29/20 2130   heparin injection 5,000 Units  5,000 Units Subcutaneous Q8H Dahal, Marlowe Aschoff, MD   5,000 Units at 12/30/20 0513   hydrALAZINE (APRESOLINE) injection 10 mg  10 mg Intravenous Q6H PRN Dahal, Marlowe Aschoff, MD       morphine 2 MG/ML injection 2 mg  2 mg Intravenous Q2H PRN Dahal, Binaya, MD       oxyCODONE (Oxy IR/ROXICODONE) immediate release tablet 5 mg  5 mg Oral Q4H PRN Dahal, Binaya, MD       pantoprazole (PROTONIX) EC tablet 40 mg  40 mg Oral Daily Dahal, Binaya, MD       polyethylene glycol (MIRALAX / GLYCOLAX) packet 17 g  17 g Oral Daily PRN Dahal, Binaya, MD       sodium chloride flush (NS) 0.9 % injection 10-40 mL  10-40 mL Intracatheter PRN Dahal, Binaya, MD       sodium phosphate (FLEET) 7-19 GM/118ML enema 1 enema  1  enema Rectal Once PRN Terrilee Croak, MD       [START ON 12/31/2020] sulfamethoxazole-trimethoprim (BACTRIM DS) 800-160 MG per tablet 1 tablet  1 tablet Oral Once per day on Mon Wed Fri Terrilee Croak, MD        PHYSICAL EXAMINATION: ECOG PERFORMANCE STATUS: 1 - Symptomatic but completely ambulatory  Vitals:   12/30/20 0129 12/30/20 0522  BP: (!) 107/45 (!) 100/51  Pulse: 62 61  Resp: (!) 22 18  Temp: 97.9 F (36.6 C) 97.6 F (36.4 C)  SpO2: 97% 90%   Filed Weights   12/29/20 1245 12/29/20 1536  Weight: 100 kg 99.8 kg    GENERAL:alert, noted to have increased work of breathing SKIN: Noted skin bruising EYES: normal, Conjunctiva are pink and non-injected, sclera clear OROPHARYNX:no exudate, no erythema and lips, buccal mucosa, and  tongue normal  NECK: supple, thyroid normal size, non-tender, without nodularity LYMPH:  no palpable lymphadenopathy in the cervical, axillary or inguinal LUNGS: Rales to bilateral lungs with bilateral scattered wheezes HEART: regular rate & rhythm and no murmurs and no lower extremity edema ABDOMEN:abdomen soft, non-tender and normal bowel sounds Musculoskeletal:no cyanosis of digits and no clubbing  NEURO: alert & oriented x 3 with fluent speech, no focal motor/sensory deficits  LABORATORY DATA:  I have reviewed the data as listed    Component Value Date/Time   NA 141 12/30/2020 0319   NA 141 08/23/2017 1026   K 4.6 12/30/2020 0319   K 3.7 08/23/2017 1026   CL 103 12/30/2020 0319   CL 98 02/02/2013 1353   CO2 29 12/30/2020 0319   CO2 33 (H) 08/23/2017 1026   GLUCOSE 108 (H) 12/30/2020 0319   GLUCOSE 92 08/23/2017 1026   GLUCOSE 99 02/02/2013 1353   BUN 26 (H) 12/30/2020 0319   BUN 21.0 08/23/2017 1026   CREATININE 1.37 (H) 12/30/2020 0319   CREATININE 1.42 (H) 12/17/2020 1037   CREATININE 1.2 (H) 08/23/2017 1026   CALCIUM 7.9 (L) 12/30/2020 0319   CALCIUM 9.3 08/23/2017 1026   PROT 5.6 (L) 12/29/2020 1258   PROT 5.9 (L) 03/28/2020 1044   PROT 6.3 (L) 08/23/2017 1026   ALBUMIN 3.5 12/29/2020 1258   ALBUMIN 3.8 08/23/2017 1026   AST 14 (L) 12/29/2020 1258   AST 10 (L) 12/17/2020 1037   AST 11 08/23/2017 1026   ALT 8 12/29/2020 1258   ALT 10 12/17/2020 1037   ALT 10 08/23/2017 1026   ALKPHOS 86 12/29/2020 1258   ALKPHOS 66 08/23/2017 1026   BILITOT 0.5 12/29/2020 1258   BILITOT 0.4 12/17/2020 1037   BILITOT 0.59 08/23/2017 1026   GFRNONAA 40 (L) 12/30/2020 0319   GFRNONAA 39 (L) 12/17/2020 1037   GFRAA 49 (L) 07/21/2020 1120    No results found for: SPEP, UPEP  Lab Results  Component Value Date   WBC 131.3 (HH) 12/30/2020   NEUTROABS 6.0 12/29/2020   HGB 8.3 (L) 12/30/2020   HCT 30.0 (L) 12/30/2020   MCV 100.3 (H) 12/30/2020   PLT 143 (L) 12/30/2020       Chemistry      Component Value Date/Time   NA 141 12/30/2020 0319   NA 141 08/23/2017 1026   K 4.6 12/30/2020 0319   K 3.7 08/23/2017 1026   CL 103 12/30/2020 0319   CL 98 02/02/2013 1353   CO2 29 12/30/2020 0319   CO2 33 (H) 08/23/2017 1026   BUN 26 (H) 12/30/2020 0319   BUN 21.0 08/23/2017 1026  CREATININE 1.37 (H) 12/30/2020 0319   CREATININE 1.42 (H) 12/17/2020 1037   CREATININE 1.2 (H) 08/23/2017 1026      Component Value Date/Time   CALCIUM 7.9 (L) 12/30/2020 0319   CALCIUM 9.3 08/23/2017 1026   ALKPHOS 86 12/29/2020 1258   ALKPHOS 66 08/23/2017 1026   AST 14 (L) 12/29/2020 1258   AST 10 (L) 12/17/2020 1037   AST 11 08/23/2017 1026   ALT 8 12/29/2020 1258   ALT 10 12/17/2020 1037   ALT 10 08/23/2017 1026   BILITOT 0.5 12/29/2020 1258   BILITOT 0.4 12/17/2020 1037   BILITOT 0.59 08/23/2017 1026       RADIOGRAPHIC STUDIES: I have personally reviewed the radiological images as listed and agreed with the findings in the report. DG Chest 2 View  Result Date: 12/07/2020 CLINICAL DATA:  75 year old female with a history of CLL and hypoxia EXAM: CHEST - 2 VIEW COMPARISON:  Chest CT 12/03/2020, plain film 11/11/2020 FINDINGS: Cardiomediastinal silhouette unchanged in size and contour. Rounded opacity in the right hilar region, corresponding to adenopathy on the contemporaneous chest CT. Pattern of reticulonodular opacities of the bilateral lungs, corresponding to findings on prior CT. On the lateral view there is partial volume loss/consolidation of the right middle lobe, present on the CT. Minimal blunting of the right costophrenic angle. No pneumothorax. Right IJ port catheter. IMPRESSION: Reticulonodular opacities, compatible with multifocal infection and present on recent CT. As was noted, pulmonary lymphoma considered less likely. Partial volume loss/consolidation of the right middle lobe, present on comparison CT. Trace right-sided pleural fluid. Unchanged right port  catheter. Electronically Signed   By: Corrie Mckusick D.O.   On: 12/07/2020 15:25   CT Angio Chest PE W and/or Wo Contrast  Result Date: 12/03/2020 CLINICAL DATA:  Concern for pulmonary embolism. History of chronic lymphocytic leukemia. EXAM: CT ANGIOGRAPHY CHEST WITH CONTRAST TECHNIQUE: Multidetector CT imaging of the chest was performed using the standard protocol during bolus administration of intravenous contrast. Multiplanar CT image reconstructions and MIPs were obtained to evaluate the vascular anatomy. CONTRAST:  31m OMNIPAQUE IOHEXOL 350 MG/ML SOLN COMPARISON:  CT 08/11/2020 FINDINGS: Cardiovascular: No filling defects within the pulmonary arteries to suggest acute pulmonary embolism. Mediastinum/Nodes: Bulky mediastinal perihilar lymphadenopathy again noted. For example lymph node along the RIGHT bronchus intermedius measuring 25 mm compares to 25 mm. RIGHT lower paratracheal node measuring 18 mm compares to 15 mm. Lungs/Pleura: There is patchy bilateral nodular airspace disease which is worsened from comparison exam. Consolidation and peripheral nodularity in the posterior RIGHT upper lobe on image 65/6 is worsened. There is a consolidation at the LEFT lung base measuring 3.4 cm (image 88/6) which is new. There is atelectasis in the RIGHT middle lobe slightly increased. There is a RIGHT pleural effusion which is new. W Ithin the lung apices there is patchy nodular airspace disease also worsened from comparison exam. Upper Abdomen: A periaortic retroperitoneal adenopathy similar to prior. Spleen appears enlarged. Musculoskeletal: No aggressive osseous lesion Review of the MIP images confirms the above findings. IMPRESSION: 1. No acute pulmonary embolism. 2. New bilateral patchy, nodular and consolidative airspace disease most consistent multifocal pneumonia including atypical pneumonia. Secondary differential would include a pulmonary lymphoma, less favored. 3. Bulky mediastinal and axillary  lymphadenopathy related to CLL. No change from prior. 4. Splenomegaly and upper abdominal adenopathy related to CLL. Electronically Signed   By: SSuzy BouchardM.D.   On: 12/03/2020 13:41   DG Chest Port 1 View  Result Date: 12/29/2020 CLINICAL  DATA:  Shortness of breath and productive cough x1 week. EXAM: PORTABLE CHEST 1 VIEW COMPARISON:  Chest radiograph December 07, 2020 FINDINGS: Accessed right chest wall port with tip overlying the right atrium. The heart size and mediastinal contours are within unchanged. Nodular hilar contours similar to prior likely related to known bulky mediastinal adenopathy secondary to CLL. Bilateral patchy consolidations are again present with a new left midlung consolidation. Small right pleural effusion. The visualized skeletal structures are unchanged. IMPRESSION: 1. Bilateral patchy consolidations with new left midlung consolidation, findings suggestive of multifocal pneumonia. However pulmonary lymphoma slightly more suspicious on the differential in comparison prior imaging given persistence with interval increase in airspace opacities despite presumed treatment. 2. Diffuse interstitial opacities which is most likely related to pulmonary edema or an infectious process. However, in the setting of pulmonary lymphoma this would be suspicious for lymphangitic spread. 3. Small right pleural effusion increased compared to prior. Electronically Signed   By: Dahlia Bailiff MD   On: 12/29/2020 13:58   ECHOCARDIOGRAM COMPLETE  Result Date: 12/08/2020    ECHOCARDIOGRAM REPORT   Patient Name:   Erin Moore Date of Exam: 12/08/2020 Medical Rec #:  [id number]      Height:       62.0 in Accession #:    [id number]     Weight:       214.6 lb Date of Birth:  06-24-1946      BSA:          1.970 m Patient Age:    12 years       BP:           132/59 mmHg Patient Gender: F              HR:           66 bpm. Exam Location:  Inpatient Procedure: 2D Echo Indications:    Dyspnea R06.00   History:        Patient has no prior history of Echocardiogram examinations.                 Risk Factors:Hypertension and Dyslipidemia.  Sonographer:    Mikki Santee RDCS (AE) Referring Phys: [place]  1. Left ventricular ejection fraction, by estimation, is 60 to 65%. The left ventricle has normal function. The left ventricle has no regional wall motion abnormalities. Left ventricular diastolic parameters are consistent with Grade I diastolic dysfunction (impaired relaxation).  2. Right ventricular systolic function is normal. The right ventricular size is normal.  3. Mild mitral valve regurgitation  4. inferior vena cava is normal in size with greater than 50% respiratory variability, suggesting right atrial pressure of 3 mmHg. FINDINGS  Left Ventricle: Left ventricular ejection fraction, by estimation, is 60 to 65%. The left ventricle has normal function. The left ventricle has no regional wall motion abnormalities. The left ventricular internal cavity size was normal in size. There is  no left ventricular hypertrophy. Left ventricular diastolic parameters are consistent with Grade I diastolic dysfunction (impaired relaxation). Right Ventricle: The right ventricular size is normal. Right vetricular wall thickness was not assessed. Right ventricular systolic function is normal. Left Atrium: Left atrial size was normal in size. Right Atrium: Right atrial size was normal in size. Pericardium: There is no evidence of pericardial effusion. Mitral Valve: The mitral valve is abnormal. There is mild thickening of the mitral valve leaflet(s). Mild mitral valve regurgitation. Tricuspid Valve: The tricuspid valve is normal in structure. Tricuspid valve  regurgitation is trivial. Aortic Valve: The aortic valve is tricuspid. Aortic valve regurgitation is not visualized. Mild aortic valve sclerosis is present, with no evidence of aortic valve stenosis. Pulmonic Valve: The pulmonic valve was not  well visualized. Pulmonic valve regurgitation is not visualized. Aorta: The aortic root is normal in size and structure. Venous: The inferior vena cava is normal in size with greater than 50% respiratory variability, suggesting right atrial pressure of 3 mmHg. IAS/Shunts: The interatrial septum was not assessed.  LEFT VENTRICLE PLAX 2D LVIDd:         5.00 cm  Diastology LVIDs:         3.10 cm  LV e' medial:    5.55 cm/s LV PW:         1.00 cm  LV E/e' medial:  15.6 LV IVS:        0.90 cm  LV e' lateral:   7.51 cm/s LVOT diam:     2.00 cm  LV E/e' lateral: 11.5 LV SV:         88 LV SV Index:   45 LVOT Area:     3.14 cm  RIGHT VENTRICLE RV S prime:     16.30 cm/s TAPSE (M-mode): 2.6 cm LEFT ATRIUM             Index       RIGHT ATRIUM           Index LA diam:        3.80 cm 1.93 cm/m  RA Area:     23.40 cm LA Vol (A2C):   43.0 ml 21.83 ml/m RA Volume:   74.60 ml  37.87 ml/m LA Vol (A4C):   57.2 ml 29.04 ml/m LA Biplane Vol: 50.9 ml 25.84 ml/m  AORTIC VALVE LVOT Vmax:   126.00 cm/s LVOT Vmean:  85.800 cm/s LVOT VTI:    0.280 m  AORTA Ao Root diam: 2.80 cm MITRAL VALVE MV Area (PHT): 3.27 cm     SHUNTS MV Decel Time: 232 msec     Systemic VTI:  0.28 m MV E velocity: 86.60 cm/s   Systemic Diam: 2.00 cm MV A velocity: 114.00 cm/s MV E/A ratio:  0.76 Dorris Carnes MD Electronically signed by Dorris Carnes MD Signature Date/Time: 12/08/2020/4:17:05 PM    Final

## 2020-12-30 NOTE — Evaluation (Signed)
Clinical/Bedside Swallow Evaluation Patient Details  Name: ZABDI MIS MRN: [id number] Date of Birth: [birth year]  Today's Date: 12/30/2020 Time: SLP Start Time (ACUTE ONLY): 19 SLP Stop Time (ACUTE ONLY): 1255 SLP Time Calculation (min) (ACUTE ONLY): 25 min  Past Medical History:  Past Medical History:  Diagnosis Date  . Chronic lymphocytic leukemia (CLL), B-cell ([place]) 12/18/2013  . CLL (chronic lymphocytic leukemia) ([place]) FALL OF 2011  . CLL (chronic lymphocytic leukemia) ([place]) 06/02/2015  . Depression   . Fatigue 07/18/2013  . Hyperlipemia   . Hypertension   . Learning disorder   . Mental disability   . Osteoarthritis   . Pharyngitis 09/18/2013  . Upper respiratory symptom 01/2018   Past Surgical History:  Past Surgical History:  Procedure Laterality Date  . ABDOMINAL HYSTERECTOMY    . BREAST BIOPSY Left 2011  . BREAST REDUCTION SURGERY    . CARPAL TUNNEL RELEASE     BILATERAL  . CESAREAN SECTION    . IR FLUORO GUIDE PORT INSERTION RIGHT  01/17/2018  . IR US GUIDE VASC ACCESS RIGHT  01/17/2018  . REDUCTION MAMMAPLASTY Bilateral 1969  . REPLACEMENT TOTAL KNEE     LEFT KNEE  . TONSILLECTOMY    . TUBAL LIGATION  1975   BILATERAL   HPI:  HTN, HLD, CLL, CKD 3, diastolic CHF, not on supplemental oxygen at baseline.   She was last hospitalized from 2/16 to 2/22 for multifocal pneumonia, diastolic CHF exacerbation treated with antibiotics, diuresed with Lasix and discharged to ALF without oxygen   Assessment / Plan / Recommendation Clinical Impression  Orders received for MBS to be completed. SLP completed bedside assessment to determine if her diet needed to be modified prior to completion of MBS, which radiology indicated could take place tomorrow morning. Pt presents with adequate dentition, CN exam unremarkable. She does not report a history of swalowing difficulty, or esophageal dysmotility. Pt was observed eating lunch, consistenting of raw vegetables and pita bread with  hummus. She was drinking water. Good oral manipulation of solids, without oral residue or anterior leakage. No overt s/s aspiration following solid or thin liquid trials. Pt was noted to cough intermittently during this session, however, cough was not noted to be immediately following the swallow. Pt does not report globus sensation or regurgitation.  Will continue regular diet at this time. Radiology is aware of both regular barium swallow and MBS. For optimal scheduling purposes and for pt to make one trip for both studies, will set up these studies for tomorrow morning Wednesday 12/31/20.    SLP Visit Diagnosis: Dysphagia, unspecified (R13.10)    Aspiration Risk  Mild aspiration risk    Diet Recommendation Thin liquid;Regular   Liquid Administration via: Straw Medication Administration: Whole meds with liquid Supervision: Patient able to self feed Compensations: Slow rate;Small sips/bites Postural Changes: Seated upright at 90 degrees    Other  Recommendations Oral Care Recommendations: Oral care BID   Follow up Recommendations Other (comment) (pending MBS results)      Frequency and Duration  pending MBS results and recommendations          Prognosis Prognosis for Safe Diet Advancement: Good      Swallow Study   General Date of Onset: 12/29/20 HPI: HTN, HLD, CLL, CKD 3, diastolic CHF, not on supplemental oxygen at baseline.   She was last hospitalized from 2/16 to 2/22 for multifocal pneumonia, diastolic CHF exacerbation treated with antibiotics, diuresed with Lasix and discharged to ALF without oxygen Type of  Study: Bedside Swallow Evaluation Previous Swallow Assessment: none Diet Prior to this Study: Regular;Thin liquids Temperature Spikes Noted: No Respiratory Status: Nasal cannula History of Recent Intubation: No Behavior/Cognition: Alert;Cooperative;Pleasant mood Oral Cavity Assessment: Within Functional Limits Oral Care Completed by SLP: No Oral Cavity - Dentition:  Adequate natural dentition Vision: Functional for self-feeding Self-Feeding Abilities: Able to feed self Patient Positioning: Upright in bed Baseline Vocal Quality: Normal Volitional Cough: Strong    Oral/Motor/Sensory Function Overall Oral Motor/Sensory Function: Within functional limits   Ice Chips Ice chips: Not tested   Thin Liquid Thin Liquid: Within functional limits Presentation: Straw;Cup    Nectar Thick Nectar Thick Liquid: Not tested   Honey Thick Honey Thick Liquid: Not tested   Puree Puree: Within functional limits Presentation: Self Fed   Solid     Solid: Within functional limits Presentation: [place] B. [facility] [place], [facility], [place] Speech Language Pathologist Office: [phone] Pager: [phone]  Shonna Chock 12/30/2020,4:30 PM

## 2020-12-31 ENCOUNTER — Inpatient Hospital Stay (HOSPITAL_COMMUNITY): Payer: Medicare Other

## 2020-12-31 DIAGNOSIS — I5033 Acute on chronic diastolic (congestive) heart failure: Secondary | ICD-10-CM

## 2020-12-31 DIAGNOSIS — R918 Other nonspecific abnormal finding of lung field: Secondary | ICD-10-CM

## 2020-12-31 DIAGNOSIS — R059 Cough, unspecified: Secondary | ICD-10-CM

## 2020-12-31 DIAGNOSIS — R0902 Hypoxemia: Secondary | ICD-10-CM

## 2020-12-31 LAB — BASIC METABOLIC PANEL
Anion gap: 12 (ref 5–15)
BUN: 28 mg/dL — ABNORMAL HIGH (ref 8–23)
CO2: 28 mmol/L (ref 22–32)
Calcium: 8.9 mg/dL (ref 8.9–10.3)
Chloride: 103 mmol/L (ref 98–111)
Creatinine, Ser: 1.25 mg/dL — ABNORMAL HIGH (ref 0.44–1.00)
GFR, Estimated: 45 mL/min — ABNORMAL LOW (ref >60)
Glucose, Bld: 106 mg/dL — ABNORMAL HIGH (ref 70–99)
Potassium: 4.3 mmol/L (ref 3.5–5.1)
Sodium: 143 mmol/L (ref 135–145)

## 2020-12-31 LAB — CBC
HCT: 31.2 % — ABNORMAL LOW (ref 36.0–46.0)
Hemoglobin: 8.5 g/dL — ABNORMAL LOW (ref 12.0–15.0)
MCH: 27.5 pg (ref 26.0–34.0)
MCHC: 27.2 g/dL — ABNORMAL LOW (ref 30.0–36.0)
MCV: 101 fL — ABNORMAL HIGH (ref 80.0–100.0)
Platelets: 135 10*3/uL — ABNORMAL LOW (ref 150–400)
RBC: 3.09 MIL/uL — ABNORMAL LOW (ref 3.87–5.11)
RDW: 17.4 % — ABNORMAL HIGH (ref 11.5–15.5)
WBC: 124.1 10*3/uL (ref 4.0–10.5)
nRBC: 0 % (ref 0.0–0.2)

## 2020-12-31 LAB — PROCALCITONIN: Procalcitonin: <0.1 ng/mL

## 2020-12-31 LAB — BRAIN NATRIURETIC PEPTIDE: B Natriuretic Peptide: 117.3 pg/mL — ABNORMAL HIGH (ref 0.0–100.0)

## 2020-12-31 MED ORDER — FUROSEMIDE 10 MG/ML IJ SOLN
40.0000 mg | Freq: Two times a day (BID) | INTRAMUSCULAR | Status: DC
  Administered 2020-12-31: 40 mg via INTRAVENOUS
  Filled 2020-12-31: qty 4

## 2020-12-31 NOTE — Progress Notes (Signed)
Objective Swallowing Evaluation: Type of Study: MBS-Modified Barium Swallow Study   Patient Details  Name: Erin Moore MRN: [id number] Date of Birth: [birth year]  Today's Date: 12/31/2020 Time: SLP Start Time (ACUTE ONLY): 0840 -SLP Stop Time (ACUTE ONLY): 0850  SLP Time Calculation (min) (ACUTE ONLY): 10 min   Past Medical History:  Past Medical History:  Diagnosis Date  . Chronic lymphocytic leukemia (CLL), B-cell ([place]) 12/18/2013  . CLL (chronic lymphocytic leukemia) ([place]) FALL OF 2011  . CLL (chronic lymphocytic leukemia) ([place]) 06/02/2015  . Depression   . Fatigue 07/18/2013  . Hyperlipemia   . Hypertension   . Learning disorder   . Mental disability   . Osteoarthritis   . Pharyngitis 09/18/2013  . Upper respiratory symptom 01/2018   Past Surgical History:  Past Surgical History:  Procedure Laterality Date  . ABDOMINAL HYSTERECTOMY    . BREAST BIOPSY Left 2011  . BREAST REDUCTION SURGERY    . CARPAL TUNNEL RELEASE     BILATERAL  . CESAREAN SECTION    . IR FLUORO GUIDE PORT INSERTION RIGHT  01/17/2018  . IR US GUIDE VASC ACCESS RIGHT  01/17/2018  . REDUCTION MAMMAPLASTY Bilateral 1969  . REPLACEMENT TOTAL KNEE     LEFT KNEE  . TONSILLECTOMY    . TUBAL LIGATION  1975   BILATERAL   HPI: HTN, HLD, CLL, CKD 3, diastolic CHF, not on supplemental oxygen at baseline.   She was last hospitalized from 2/16 to 2/22 for multifocal pneumonia, diastolic CHF exacerbation treated with antibiotics, diuresed with Lasix and discharged to ALF without oxygen.  BSE completed yesterday and pt demonstrated no indications of aspiration.  Diet was not modified and MBS/ba swallow ordered for 12/03/2020.   Subjective: pt in chair    Assessment / Plan / Recommendation  CHL IP CLINICAL IMPRESSIONS 12/31/2020  Clinical Impression Pt presents with normal oropharyngeal swallow ability. No aspiration and flash penetration of thin via cup x1 was [facility].  Swallow is strong and timely with no oropharyngeal  retention.   Of note, pt did not cough during MBS, but did cough before and after.  No SLP follow up needed.  Advised pt to findings/recommendations.  SLP Visit Diagnosis Dysphagia, unspecified (R13.10)  Attention and concentration deficit following --  Frontal lobe and executive function deficit following --  Impact on safety and function Mild aspiration risk      CHL IP TREATMENT RECOMMENDATION 12/31/2020  Treatment Recommendations Defer until completion of intrumental exam     Prognosis 12/31/2020  Prognosis for Safe Diet Advancement Good  Barriers to Reach Goals --  Barriers/Prognosis Comment --    CHL IP DIET RECOMMENDATION 12/31/2020  SLP Diet Recommendations Regular solids;Thin liquid  Liquid Administration via Cup;Straw  Medication Administration Whole meds with liquid  Compensations Slow rate;Small sips/bites  Postural Changes Remain semi-upright after after feeds/meals (Comment);Seated upright at 90 degrees      CHL IP OTHER RECOMMENDATIONS 12/31/2020  Recommended Consults --  Oral Care Recommendations Oral care BID  Other Recommendations --      CHL IP FOLLOW UP RECOMMENDATIONS 12/31/2020  Follow up Recommendations None      No flowsheet data found.         CHL IP ORAL PHASE 12/31/2020  Oral Phase Impaired  Oral - Pudding Teaspoon --  Oral - Pudding Cup --  Oral - Honey Teaspoon --  Oral - Honey Cup --  Oral - Nectar Teaspoon --  Oral - Nectar Cup --  Oral -  Nectar Straw WFL  Oral - Thin Teaspoon --  Oral - Thin Cup WFL  Oral - Thin Straw WFL  Oral - Puree WFL  Oral - Mech Soft --  Oral - Regular WFL  Oral - Multi-Consistency --  Oral - Pill --  Oral Phase - Comment --    CHL IP PHARYNGEAL PHASE 12/31/2020  Pharyngeal Phase Impaired  Pharyngeal- Pudding Teaspoon --  Pharyngeal --  Pharyngeal- Pudding Cup --  Pharyngeal --  Pharyngeal- Honey Teaspoon --  Pharyngeal --  Pharyngeal- Honey Cup --  Pharyngeal --  Pharyngeal- Nectar Teaspoon --   Pharyngeal --  Pharyngeal- Nectar Cup --  Pharyngeal --  Pharyngeal- Nectar Straw WFL  Pharyngeal --  Pharyngeal- Thin Teaspoon --  Pharyngeal --  Pharyngeal- Thin Cup [facility]  Pharyngeal Material enters airway, remains ABOVE vocal cords then ejected out  Pharyngeal- Thin Straw WFL  Pharyngeal --  Pharyngeal- Puree WFL  Pharyngeal --  Pharyngeal- Mechanical Soft NT  Pharyngeal --  Pharyngeal- Regular WFL  Pharyngeal --  Pharyngeal- Multi-consistency --  Pharyngeal --  Pharyngeal- Pill --  Pharyngeal --  Pharyngeal Comment --     CHL IP CERVICAL ESOPHAGEAL PHASE 12/31/2020  Cervical Esophageal Phase WFL  Pudding Teaspoon --  Pudding Cup --  Honey Teaspoon --  Honey Cup --  Nectar Teaspoon --  Nectar Cup --  Nectar Straw --  Thin Teaspoon --  Thin Cup --  Thin Straw --  Puree --  Mechanical Soft --  Regular --  Multi-consistency --  Pill --  Cervical Esophageal Comment --    Kathleen Lime, MS [facility] SLP Acute Rehab Services Office [phone] Pager [phone]   Macario Golds 12/31/2020, 9:51 AM

## 2020-12-31 NOTE — Progress Notes (Signed)
PROGRESS NOTE  Erin Moore  DOB: 09/09/1946  PCP: Jonathon Jordan, MD PIR:[id number]  DOA: 12/29/2020  LOS: 2 days   Chief complaint: Shortness of breath.  Brief narrative: Erin Moore is a 75 y.o. female with PMH significant for HTN, HLD, CLL, CKD 3, diastolic CHF, not on supplemental oxygen at baseline. She was last hospitalized from 2/16 to 2/22 for multifocal pneumonia, diastolic CHF exacerbation treated with antibiotics, diuresed with Lasix and discharged to ALF without oxygen. Patient follows up with Dr. Alvy Bimler as an outpatient for CLL. Patient is chronically on acalabrutinib for CLL.  She has leukocytosis as a side effect of the immunotherapy.  Last seen by oncologist on 12/17/2020. Patient presented to the ED today from [facility] with shortness of breath for 1 week with productive cough, O2 sat 74% on room air.  In the ED, patient was afebrile, heart rate in 60s, blood pressure in low normal range.  Oxygen saturation more than 95% on 6 L by nasal cannula. Labs with WBC count elevated to 144,000, hemoglobin at 9, Lactic acid level normal Respiratory virus panel negative. Chest x-ray findings as below bilateral patchy consolidations with new left midlung consolidation, findings suggestive of multifocal pneumonia. However findings could also be because of lymphangitic spread of pulmonary lymphoma, edema or infection.  Patient was admitted to hospitalist service Oncology pulmonology, cardiology consultations obtained.  Subjective: Patient was seen and examined this morning.  Elderly Caucasian female.  Propped up in bed.   Wheezing better than yesterday but still is diffuse.  Remains on 6 L oxygen.  Getting aggressively diuresed.    Assessment/Plan: Acute respiratory failure with hypoxia -Presented with progressively worsening shortness of breath, cough, hypoxia  -possible etiologies: Multifocal pneumonia versus CHF exacerbation versus lymphangitic spread of  lymphoma. -Not on supplemental oxygen at home.  Currently requiring 6 L by nasal cannula.  Wean down as tolerated  Multifocal pneumonia -No fever, WBC count always elevated.  Based on CT scan report and immunocompromised status, multifocal pneumonia is also a consideration.   -Currently on IV cefepime.  Continue the same because of immunocompromised status. -Speech therapy evaluation to rule out dysphagia and aspiration pneumonia -Pulmonology consultation appreciated.  Recent Labs  Lab 12/29/20 1258 12/30/20 0319 12/31/20 0427  WBC 144.5* 131.3* 124.1*  LATICACIDVEN 0.6  --   --   PROCALCITON <0.10 <0.10 <0.10   Acute exacerbation of congestive heart failure Essential hypertension -Recent echocardiogram with EF 60 to 63%, grade 1 diastolic dysfunction -On Lasix 20 mg daily. Presented with shortness of breath, wheezing, mild bilateral pedal edema. -Lasix 40 mg IV twice daily at this time.  Continue bisoprolol.  Continue to hold losartan and amlodipine. -Net IO Since Admission: -4,841.39 mL [12/31/20 1350] -Continue to monitor for daily intake output, weight, blood pressure, BNP, renal function and electrolytes. 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 Alvy Bimler as an outpatient for CLL. Patient is chronically on acalabrutinib for CLL.  She has leukocytosis as a side effect of the immunotherapy.  Last seen by oncologist on 12/17/2020. -Defer to oncology for the concern of pulmonary lymphangitic spread. -Keep acalabrutinib on hold.  Continue acyclovir, Bactrim as before  CKD stage IIIa -Creatinine remains at baseline.  Continue to monitor Recent Labs    12/04/20 0313 12/05/20  0310 12/06/20 0400 12/07/20 0306 12/08/20 0310 12/09/20 0335 12/17/20 1037 12/29/20 1258  12/30/20 0319 12/31/20 0427  BUN 25* 32* 35* 36* 42* 43* 21 22 26* 28*  CREATININE 1.39* 1.11* 1.06* 0.97 1.08* 1.19* 1.42* 1.29* 1.37* 1.25*   GERD -PPI  Mobility: Encourage ambulation Code Status:   Code Status: Full Code  Nutritional status: Body mass index is 40.26 kg/m.     Diet Order            Diet Heart Room service appropriate? Yes; Fluid consistency: Thin  Diet effective now                 DVT prophylaxis: heparin injection 5,000 Units Start: 12/29/20 2200   Antimicrobials:  IV cefepime Fluid: None Consultants: Oncology, PCCM Family Communication:  None at bedside  Status is: Inpatient  Remains inpatient appropriate because: Remains on respiratory distress  Dispo: The patient is from: Home              Anticipated d/c is to: Home              Patient currently is not medically stable to d/c.   Difficult to place patient No  Infusions:  . ceFEPime (MAXIPIME) IV 2 g (12/31/20 0502)    Scheduled Meds: . acyclovir  400 mg Oral Daily  . albuterol  2.5 mg Nebulization Q6H  . bisoprolol  10 mg Oral Daily  . Chlorhexidine Gluconate Cloth  6 each Topical Daily  . citalopram  20 mg Oral q morning  . furosemide  40 mg Intravenous BID  . gabapentin  300 mg Oral QHS  . heparin  5,000 Units Subcutaneous Q8H  . pantoprazole  40 mg Oral Daily  . sulfamethoxazole-trimethoprim  1 tablet Oral Once per day on Mon Wed Fri    Antimicrobials: Anti-infectives (From admission, onward)   Start     Dose/Rate Route Frequency Ordered Stop   12/31/20 1000  sulfamethoxazole-trimethoprim (BACTRIM DS) 800-160 MG per tablet 1 tablet        1 tablet Oral Once per day on Mon Wed Fri 12/29/20 1722     12/30/20 1000  acyclovir (ZOVIRAX) tablet 400 mg        400 mg Oral Daily 12/29/20 1722     12/29/20 1800  ceFEPIme (MAXIPIME) 2 g in sodium chloride 0.9 % 100 mL IVPB        2 g 200 mL/hr over 30 Minutes Intravenous Every 12 hours 12/29/20 1726        PRN  meds: acetaminophen **OR** acetaminophen, bisacodyl, hydrALAZINE, morphine injection, oxyCODONE, polyethylene glycol, sodium chloride flush, sodium phosphate   Objective: Vitals:   12/31/20 0800 12/31/20 1328  BP:  (!) 143/57  Pulse:  72  Resp:  18  Temp:  98.6 F (37 C)  SpO2: 92% 90%    Intake/Output Summary (Last 24 hours) at 12/31/2020 1350 Last data filed at 12/31/2020 1100 Gross per 24 hour  Intake 208.61 ml  Output 3200 ml  Net -2991.39 ml   Filed Weights   12/29/20 1245 12/29/20 1536  Weight: 100 kg 99.8 kg   Weight change:  Body mass index is 40.26 kg/m.   Physical Exam: General exam: Pleasant, elderly Caucasian female. Mild respiratory distress Skin: No rashes, lesions or ulcers. HEENT: Atraumatic, normocephalic, no obvious bleeding Lungs: Audible wheezing bilaterally.  Bilateral scattered crackles CVS: Regular rate and rhythm, no murmur GI/Abd soft, nontender, nondistended, bowel sound present CNS: Alert, awake, oriented x3 Psychiatry: Mood appropriate  Extremities: Trace bilateral pedal edema, no calf tenderness  Data Review: I have personally reviewed the laboratory data and studies available.  Recent Labs  Lab 12/29/20 1258 12/30/20 0319 12/31/20 0427  WBC 144.5* 131.3* 124.1*  NEUTROABS 6.0  --   --   HGB 9.0* 8.3* 8.5*  HCT 32.3* 30.0* 31.2*  MCV 100.0 100.3* 101.0*  PLT 144* 143* 135*   Recent Labs  Lab 12/29/20 1258 12/30/20 0319 12/31/20 0427  NA 141 141 143  K 4.9 4.6 4.3  CL 105 103 103  CO2 27 29 28   GLUCOSE 112* 108* 106*  BUN 22 26* 28*  CREATININE 1.29* 1.37* 1.25*  CALCIUM 8.9 7.9* 8.9    F/u labs ordered Unresulted Labs (From admission, onward)          Start     Ordered   12/30/20 1346  Aspergillus Ag, BAL/Serum  Once,   R        12/30/20 1345   12/30/20 1345  Fungitell, Serum  Once,   R       Question:  Specimen collection method  Answer:  IV Team=IV Team collect   12/30/20 1345   12/30/20 3664  Basic metabolic  panel  Daily,   R      12/29/20 1711   12/30/20 0500  CBC  Daily,   R      12/29/20 1711   12/30/20 0500  Brain natriuretic peptide  Daily,   R      12/29/20 1707          Signed, Terrilee Croak, MD [facility] 12/31/2020

## 2020-12-31 NOTE — Progress Notes (Signed)
Progress Note  Patient Name: Erin Moore Date of Encounter: 12/31/2020  [facility] Cardiologist: Erin Him, MD   Subjective   Had a barium swallow study this morning.  Breathing stable.  No chest pain.  Inpatient Medications    Scheduled Meds: . acyclovir  400 mg Oral Daily  . albuterol  2.5 mg Nebulization Q6H  . bisoprolol  10 mg Oral Daily  . Chlorhexidine Gluconate Cloth  6 each Topical Daily  . citalopram  20 mg Oral q morning  . gabapentin  300 mg Oral QHS  . heparin  5,000 Units Subcutaneous Q8H  . pantoprazole  40 mg Oral Daily  . sulfamethoxazole-trimethoprim  1 tablet Oral Once per day on Mon Wed Fri   Continuous Infusions: . ceFEPime (MAXIPIME) IV 2 g (12/31/20 0502)   PRN Meds: acetaminophen **OR** acetaminophen, bisacodyl, hydrALAZINE, morphine injection, oxyCODONE, polyethylene glycol, sodium chloride flush, sodium phosphate   Vital Signs    Vitals:   12/30/20 2046 12/31/20 0101 12/31/20 0626 12/31/20 0800  BP: (!) 119/53  109/60   Pulse: 72  68   Resp: 20  18   Temp: 98 F (36.7 C)  97.6 F (36.4 C)   TempSrc: Oral  Oral   SpO2: 96% 92% 95% 92%  Weight:      Height:        Intake/Output Summary (Last 24 hours) at 12/31/2020 1031 Last data filed at 12/31/2020 0754 Gross per 24 hour  Intake 208.61 ml  Output 3400 ml  Net -3191.39 ml   Last 3 Weights 12/29/2020 12/29/2020 12/17/2020  Weight (lbs) 220 lb 2 oz 220 lb 7.4 oz 221 lb 12.8 oz  Weight (kg) 99.848 kg 100 kg 100.608 kg      Telemetry    SR - Personally Reviewed  ECG    N/A  Physical Exam   GEN: No acute distress.   Neck: No JVD Cardiac: RRR, no murmurs, rubs, or gallops.  Respiratory: Clear to auscultation bilaterally. GI: Soft, nontender, non-distended  MS: No edema; No deformity. Neuro:  Nonfocal  Psych: Normal affect   Labs    High Sensitivity Troponin:  No results for input(s): TROPONINIHS in the last 720 hours.    Chemistry Recent Labs  Lab  12/29/20 1258 12/30/20 0319 12/31/20 0427  NA 141 141 143  K 4.9 4.6 4.3  CL 105 103 103  CO2 27 29 28   GLUCOSE 112* 108* 106*  BUN 22 26* 28*  CREATININE 1.29* 1.37* 1.25*  CALCIUM 8.9 7.9* 8.9  PROT 5.6*  --   --   ALBUMIN 3.5  --   --   AST 14*  --   --   ALT 8  --   --   ALKPHOS 86  --   --   BILITOT 0.5  --   --   GFRNONAA 43* 40* 45*  ANIONGAP 9 9 12      Hematology Recent Labs  Lab 12/29/20 1258 12/30/20 0319 12/31/20 0427  WBC 144.5* 131.3* 124.1*  RBC 3.23* 2.99* 3.09*  HGB 9.0* 8.3* 8.5*  HCT 32.3* 30.0* 31.2*  MCV 100.0 100.3* 101.0*  MCH 27.9 27.8 27.5  MCHC 27.9* 27.7* 27.2*  RDW 17.5* 17.7* 17.4*  PLT 144* 143* 135*    BNP Recent Labs  Lab 12/29/20 1258 12/30/20 0319 12/31/20 0427  BNP 155.3* 120.9* 117.3*     DDimer No results for input(s): DDIMER in the last 168 hours.   Radiology    DG Chest  Port 1 View  Result Date: 12/29/2020 CLINICAL DATA:  Shortness of breath and productive cough x1 week. EXAM: PORTABLE CHEST 1 VIEW COMPARISON:  Chest radiograph December 07, 2020 FINDINGS: Accessed right chest wall port with tip overlying the right atrium. The heart size and mediastinal contours are within unchanged. Nodular hilar contours similar to prior likely related to known bulky mediastinal adenopathy secondary to CLL. Bilateral patchy consolidations are again present with a new left midlung consolidation. Small right pleural effusion. The visualized skeletal structures are unchanged. IMPRESSION: 1. Bilateral patchy consolidations with new left midlung consolidation, findings suggestive of multifocal pneumonia. However pulmonary lymphoma slightly more suspicious on the differential in comparison prior imaging given persistence with interval increase in airspace opacities despite presumed treatment. 2. Diffuse interstitial opacities which is most likely related to pulmonary edema or an infectious process. However, in the setting of pulmonary lymphoma  this would be suspicious for lymphangitic spread. 3. Small right pleural effusion increased compared to prior. Electronically Signed   By: Dahlia Bailiff MD   On: 12/29/2020 13:58   DG Swallowing Func-Speech Pathology  Result Date: 12/31/2020 Objective Swallowing Evaluation: Type of Study: MBS-Modified Barium Swallow Study  Patient Details Name: Erin Moore MRN: [id number] Date of Birth: 1946/05/24 Today's Date: 12/31/2020 Time: SLP Start Time (ACUTE ONLY): 0840 -SLP Stop Time (ACUTE ONLY): 0850 SLP Time Calculation (min) (ACUTE ONLY): 10 min Past Medical History: Past Medical History: Diagnosis Date . Chronic lymphocytic leukemia (CLL), B-cell ([place]) 12/18/2013 . CLL (chronic lymphocytic leukemia) ([place]) FALL OF 2011 . CLL (chronic lymphocytic leukemia) ([place]) 06/02/2015 . Depression  . Fatigue 07/18/2013 . Hyperlipemia  . Hypertension  . Learning disorder  . Mental disability  . Osteoarthritis  . Pharyngitis 09/18/2013 . Upper respiratory symptom 01/2018 Past Surgical History: Past Surgical History: Procedure Laterality Date . ABDOMINAL HYSTERECTOMY   . BREAST BIOPSY Left 2011 . BREAST REDUCTION SURGERY   . CARPAL TUNNEL RELEASE    BILATERAL . CESAREAN SECTION   . IR FLUORO GUIDE PORT INSERTION RIGHT  01/17/2018 . IR US GUIDE VASC ACCESS RIGHT  01/17/2018 . REDUCTION MAMMAPLASTY Bilateral 1969 . REPLACEMENT TOTAL KNEE    LEFT KNEE . TONSILLECTOMY   . TUBAL LIGATION  1975  BILATERAL HPI: HTN, HLD, CLL, CKD 3, diastolic CHF, not on supplemental oxygen at baseline.   She was last hospitalized from 2/16 to 2/22 for multifocal pneumonia, diastolic CHF exacerbation treated with antibiotics, diuresed with Lasix and discharged to ALF without oxygen.  BSE completed yesterday and pt demonstrated no indications of aspiration.  Diet was not modified and MBS/ba swallow ordered for 12/03/2020.  Subjective: pt in chair Assessment / Plan / Recommendation CHL IP CLINICAL IMPRESSIONS 12/31/2020 Clinical Impression Pt presents with normal  oropharyngeal swallow ability. No aspiration and flash penetration of thin via cup x1 was [facility].  Swallow is strong and timely with no oropharyngeal retention.   Of note, pt did not cough during MBS, but did cough before and after.  No SLP follow up needed.  Advised pt to findings/recommendations. SLP Visit Diagnosis Dysphagia, unspecified (R13.10) Attention and concentration deficit following -- Frontal lobe and executive function deficit following -- Impact on safety and function Mild aspiration risk   CHL IP TREATMENT RECOMMENDATION 12/31/2020 Treatment Recommendations Defer until completion of intrumental exam   Prognosis 12/31/2020 Prognosis for Safe Diet Advancement Good Barriers to Reach Goals -- Barriers/Prognosis Comment -- CHL IP DIET RECOMMENDATION 12/31/2020 SLP Diet Recommendations Regular solids;Thin liquid Liquid Administration via Cup;Straw Medication Administration Whole  meds with liquid Compensations Slow rate;Small sips/bites Postural Changes Remain semi-upright after after feeds/meals (Comment);Seated upright at 90 degrees   CHL IP OTHER RECOMMENDATIONS 12/31/2020 Recommended Consults -- Oral Care Recommendations Oral care BID Other Recommendations --   CH[facility] IP FOLLOW UP RECOMMENDATIONS 12/31/2020 Follow up Recommendations None   No flowsheet data found.     CHL IP ORAL PHASE 12/31/2020 Oral Phase Impaired Oral - Pudding Teaspoon -- Oral - Pudding Cup -- Oral - Honey Teaspoon -- Oral - Honey Cup -- Oral - Nectar Teaspoon -- Oral - Nectar Cup -- Oral - Nectar Straw WFL Oral - Thin Teaspoon -- Oral - Thin Cup WFL Oral - Thin Straw WFL Oral - Puree WFL Oral - Mech Soft -- Oral - Regular WFL Oral - Multi-Consistency -- Oral - Pill -- Oral Phase - Comment --  CHL IP PHARYNGEAL PHASE 12/31/2020 Pharyngeal Phase Impaired Pharyngeal- Pudding Teaspoon -- Pharyngeal -- Pharyngeal- Pudding Cup -- Pharyngeal -- Pharyngeal- Honey Teaspoon -- Pharyngeal -- Pharyngeal- Honey Cup -- Pharyngeal -- Pharyngeal- Nectar  Teaspoon -- Pharyngeal -- Pharyngeal- Nectar Cup -- Pharyngeal -- Pharyngeal- Nectar Straw WFL Pharyngeal -- Pharyngeal- Thin Teaspoon -- Pharyngeal -- Pharyngeal- Thin Cup [facility] Pharyngeal Material enters airway, remains ABOVE vocal cords then ejected out Pharyngeal- Thin Straw WFL Pharyngeal -- Pharyngeal- Puree WFL Pharyngeal -- Pharyngeal- Mechanical Soft NT Pharyngeal -- Pharyngeal- Regular WFL Pharyngeal -- Pharyngeal- Multi-consistency -- Pharyngeal -- Pharyngeal- Pill -- Pharyngeal -- Pharyngeal Comment --  CHL IP CERVICAL ESOPHAGEAL PHASE 12/31/2020 Cervical Esophageal Phase WFL Pudding Teaspoon -- Pudding Cup -- Honey Teaspoon -- Honey Cup -- Nectar Teaspoon -- Nectar Cup -- Nectar Straw -- Thin Teaspoon -- Thin Cup -- Thin Straw -- Puree -- Mechanical Soft -- Regular -- Multi-consistency -- Pill -- Cervical Esophageal Comment -- Kathleen Lime, MS [facility] SLP Acute Rehab Services Office [phone] Pager [phone] Macario Golds 12/31/2020, 9:52 AM              DG ESOPHAGUS W SINGLE CM (SOL OR THIN BA)  Result Date: 12/31/2020 CLINICAL DATA:  Chronic cough. EXAM: ESOPHOGRAM/BARIUM SWALLOW TECHNIQUE: Single contrast examination was performed using  thin barium. FLUOROSCOPY TIME:  Fluoroscopy Time:  42 seconds Radiation Exposure Index (if provided by the fluoroscopic device): 1.9 mGy Number of Acquired Spot Images: 0 COMPARISON:  CTA chest of 12/03/2020. FINDINGS: Focused, single-contrast exam performed with the patient in a minimally oblique supine to LPO position. Mild limitations secondary to patient clinical status, immobility. Evaluation of esophageal peristalsis demonstrates an incomplete primary peristaltic wave with proximal escape waves and contrast stasis in the mid and upper esophagus. Full column evaluation of the esophagus demonstrates no persistent narrowing to suggest stricture. A 13 mm barium tablet passes promptly. IMPRESSION: 1. Esophageal dysmotility, likely presbyesophagus. 2. No evidence  of esophageal stricture. Electronically Signed   By: Abigail Miyamoto M.D.   On: 12/31/2020 09:46    Cardiac Studies   Echo 11/2020 1. Left ventricular ejection fraction, by estimation, is 60 to 65%. The  left ventricle has normal function. The left ventricle has no regional  wall motion abnormalities. Left ventricular diastolic parameters are  consistent with Grade I diastolic  dysfunction (impaired relaxation).  2. Right ventricular systolic function is normal. The right ventricular  size is normal.  3. Mild mitral valve regurgitation  4. inferior vena cava is normal in size with greater than 50% respiratory  variability, suggesting right atrial pressure of 3 mmHg.   Patient Profile     75  y.o. female a hx of CLL, HTN, chronic diastolic heart failure, CKD stage III and pancytopenia seen for CHF in setting of recurrent acute hypoxic respiratory failure and multifocal pneumonia.   Diuresed 10L last admission. Discharged weight was 97kg.  Echo 12/08/20 with LVEF of 60-65%, no WM abnormality and grade 1 DD.   Assessment & Plan    1.  Chronic diastolic heart failure - BNP normal at 155>>120>>117 - Chest x-ray concerning for multifocal pneumonia with lymphangitic spread of pulmonary lymphoma, edema or infection. - Initial bump in creatinine with one dose of IV lasix >> increased to 40mg  BID>> renal function improved to 1.25 today with 3.3L diuresis in past 24 hours and Net I & O 4.3L.  - Will add daily weight.  - Will continue IV lasix 40mg  BID.  - Continue Bisoprolol 10mg  daily  - Will review afterload meds with MD. BP soft low.   2. Acute on CKD III - baseline Scr around 1.1 - Follow renal function with diuresis   3. Acute hypoxic respiratory failure - Appreciate PCCM assistant - No aspiration on barium study   For questions or updates, please contact [place] Please consult [email or web address] for contact info under        SignedLeanor Kail, PA  12/31/2020,  10:31 AM

## 2020-12-31 NOTE — Progress Notes (Signed)
Erin Moore   DOB:[birth year]   SA#:[id number]    ASSESSMENT & PLAN:  CLL (chronic lymphocytic leukemia) ([place]) She has persistent leukocytosis but this is not unexpected side effects from acalabrutinib Treatment currently on hold Overall, clinically, she appears to be responding to treatment  Acute respiratory failure with hypoxia Presented with worsening shortness of breath, cough, hypoxia Chest x-ray suggestive of multifocal pneumonia versus CHF exacerbation versus lymphangitic spread of lymphoma In my opinion, this is not consistent with diagnosis of lymphoma/CLL She had numerous courses of antibiotics in the past From the prior hospitalization, she diuresed almost 11 L of fluid out and certainly volume overload could give similar picture Continue antibiotics for possible pneumonia per hospitalist Recommend aggressive diuresis  Appreciated pulmonologist evaluation  No aspiration is noted during swallow eval  Pancytopenia, acquired ([place]) The cause of her pancytopenia is multifactorial  Monitor  Acute diastolic CHF (congestive heart failure) ([place]) Last echocardiogram was performed on 12/08/2020 which showed an LVEF of 60 to 65% Respiratory status improved significantly with aggressive diuresis last admission and she lost about 10 pounds Continue diuresis per hospitalist Appreciate cardiologist management  CKD (chronic kidney disease), symptom management only, stage 3 (moderate) ([place]) She has slight elevated serum creatinine from baseline but likely due to diuretic therapy For now, I recommend she continues the same  Goals of care  Due to the patient's mild baseline mental deficit, her sister/HC POA makes all of her medical decisions I have updated her sister with the plan of care and she agreed with the plan of care  Discharge planning Anticipate she will be hospitalized for at least 3 to 4 days All questions were answered. The patient knows to call the clinic with any  problems, questions or concerns.   Heath Lark, MD 12/31/2020 10:36 AM  Subjective:  She has not felt a whole lot better yet.  She is diuresing aggressively She continues to have intermittent cough and wheezing  Objective:  Vitals:   12/31/20 0626 12/31/20 0800  BP: 109/60   Pulse: 68   Resp: 18   Temp: 97.6 F (36.4 C)   SpO2: 95% 92%     Intake/Output Summary (Last 24 hours) at 12/31/2020 1036 Last data filed at 12/31/2020 0754 Gross per 24 hour  Intake 208.61 ml  Output 3400 ml  Net -3191.39 ml    GENERAL:alert, no distress and comfortable SKIN: She looks a bit pale LUNGS: Diffuse scattered wheezes.  She has oxygen delivered via nasal cannula HEART: regular rate & rhythm and no murmurs ABDOMEN:abdomen soft, non-tender and normal bowel sounds Musculoskeletal:no cyanosis of digits and no clubbing  NEURO: alert & oriented x 3 with fluent speech, no focal motor/sensory deficits   Labs:  Recent Labs    05/05/20 1007 06/09/20 1037 07/21/20 1120 08/25/20 1129 12/04/20 0313 12/05/20 0310 12/17/20 1037 12/29/20 1258 12/30/20 0319 12/31/20 0427  NA 142 140 142   < > 144   < > 145 141 141 143  K 4.4 3.8 4.2   < > 5.2*   < > 4.1 4.9 4.6 4.3  CL 103 103 105   < > 108   < > 107 105 103 103  CO2 26 27 29    < > 28   < > 27 27 29 28   GLUCOSE 106* 106* 102*   < > 137*   < > 91 112* 108* 106*  BUN 29* 22 26*   < > 25*   < > 21 22 26* 28*  CREATININE 1.18* 1.29* 1.26*   < > 1.39*   < > 1.42* 1.29* 1.37* 1.25*  CALCIUM 9.7 9.7 9.5   < > 9.1   < > 9.1 8.9 7.9* 8.9  GFRNONAA 45* 41* 42*   < > 40*   < > 39* 43* 40* 45*  GFRAA 53* 47* 49*  --   --   --   --   --   --   --   PROT 6.3* 5.9* 6.1*   < > 5.9*  --  5.8* 5.6*  --   --   ALBUMIN 4.3 3.7 3.8   < > 3.8  --  3.7 3.5  --   --   AST 11* 14* 19   < > 13*  --  10* 14*  --   --   ALT 12 10 14    < > 16  --  10 8  --   --   ALKPHOS 79 86 83   < > 102  --  99 86  --   --   BILITOT 0.4 0.4 0.5   < > 0.9  --  0.4 0.5  --   --    <  > = values in this interval not displayed.    Studies:  DG Chest 2 View  Result Date: 12/07/2020 CLINICAL DATA:  75 year old female with a history of CLL and hypoxia EXAM: CHEST - 2 VIEW COMPARISON:  Chest CT 12/03/2020, plain film 11/11/2020 FINDINGS: Cardiomediastinal silhouette unchanged in size and contour. Rounded opacity in the right hilar region, corresponding to adenopathy on the contemporaneous chest CT. Pattern of reticulonodular opacities of the bilateral lungs, corresponding to findings on prior CT. On the lateral view there is partial volume loss/consolidation of the right middle lobe, present on the CT. Minimal blunting of the right costophrenic angle. No pneumothorax. Right IJ port catheter. IMPRESSION: Reticulonodular opacities, compatible with multifocal infection and present on recent CT. As was noted, pulmonary lymphoma considered less likely. Partial volume loss/consolidation of the right middle lobe, present on comparison CT. Trace right-sided pleural fluid. Unchanged right port catheter. Electronically Signed   By: Corrie Mckusick D.O.   On: 12/07/2020 15:25   CT Angio Chest PE W and/or Wo Contrast  Result Date: 12/03/2020 CLINICAL DATA:  Concern for pulmonary embolism. History of chronic lymphocytic leukemia. EXAM: CT ANGIOGRAPHY CHEST WITH CONTRAST TECHNIQUE: Multidetector CT imaging of the chest was performed using the standard protocol during bolus administration of intravenous contrast. Multiplanar CT image reconstructions and MIPs were obtained to evaluate the vascular anatomy. CONTRAST:  47mL OMNIPAQUE IOHEXOL 350 MG/ML SOLN COMPARISON:  CT 08/11/2020 FINDINGS: Cardiovascular: No filling defects within the pulmonary arteries to suggest acute pulmonary embolism. Mediastinum/Nodes: Bulky mediastinal perihilar lymphadenopathy again noted. For example lymph node along the RIGHT bronchus intermedius measuring 25 mm compares to 25 mm. RIGHT lower paratracheal node measuring 18 mm  compares to 15 mm. Lungs/Pleura: There is patchy bilateral nodular airspace disease which is worsened from comparison exam. Consolidation and peripheral nodularity in the posterior RIGHT upper lobe on image 65/6 is worsened. There is a consolidation at the LEFT lung base measuring 3.4 cm (image 88/6) which is new. There is atelectasis in the RIGHT middle lobe slightly increased. There is a RIGHT pleural effusion which is new. W Ithin the lung apices there is patchy nodular airspace disease also worsened from comparison exam. Upper Abdomen: A periaortic retroperitoneal adenopathy similar to prior. Spleen appears enlarged. Musculoskeletal: No aggressive osseous lesion  Review of the MIP images confirms the above findings. IMPRESSION: 1. No acute pulmonary embolism. 2. New bilateral patchy, nodular and consolidative airspace disease most consistent multifocal pneumonia including atypical pneumonia. Secondary differential would include a pulmonary lymphoma, less favored. 3. Bulky mediastinal and axillary lymphadenopathy related to CLL. No change from prior. 4. Splenomegaly and upper abdominal adenopathy related to CLL. Electronically Signed   By: Suzy Bouchard M.D.   On: 12/03/2020 13:41   DG Chest Port 1 View  Result Date: 12/29/2020 CLINICAL DATA:  Shortness of breath and productive cough x1 week. EXAM: PORTABLE CHEST 1 VIEW COMPARISON:  Chest radiograph December 07, 2020 FINDINGS: Accessed right chest wall port with tip overlying the right atrium. The heart size and mediastinal contours are within unchanged. Nodular hilar contours similar to prior likely related to known bulky mediastinal adenopathy secondary to CLL. Bilateral patchy consolidations are again present with a new left midlung consolidation. Small right pleural effusion. The visualized skeletal structures are unchanged. IMPRESSION: 1. Bilateral patchy consolidations with new left midlung consolidation, findings suggestive of multifocal pneumonia.  However pulmonary lymphoma slightly more suspicious on the differential in comparison prior imaging given persistence with interval increase in airspace opacities despite presumed treatment. 2. Diffuse interstitial opacities which is most likely related to pulmonary edema or an infectious process. However, in the setting of pulmonary lymphoma this would be suspicious for lymphangitic spread. 3. Small right pleural effusion increased compared to prior. Electronically Signed   By: Dahlia Bailiff MD   On: 12/29/2020 13:58   DG Swallowing Func-Speech Pathology  Result Date: 12/31/2020 Objective Swallowing Evaluation: Type of Study: MBS-Modified Barium Swallow Study  Patient Details Name: Erin Moore MRN: [id number] Date of Birth: December 26, 1945 Today's Date: 12/31/2020 Time: SLP Start Time (ACUTE ONLY): 0840 -SLP Stop Time (ACUTE ONLY): 0850 SLP Time Calculation (min) (ACUTE ONLY): 10 min Past Medical History: Past Medical History: Diagnosis Date . Chronic lymphocytic leukemia (CLL), B-cell ([place]) 12/18/2013 . CLL (chronic lymphocytic leukemia) ([place]) FALL OF 2011 . CLL (chronic lymphocytic leukemia) ([place]) 06/02/2015 . Depression  . Fatigue 07/18/2013 . Hyperlipemia  . Hypertension  . Learning disorder  . Mental disability  . Osteoarthritis  . Pharyngitis 09/18/2013 . Upper respiratory symptom 01/2018 Past Surgical History: Past Surgical History: Procedure Laterality Date . ABDOMINAL HYSTERECTOMY   . BREAST BIOPSY Left 2011 . BREAST REDUCTION SURGERY   . CARPAL TUNNEL RELEASE    BILATERAL . CESAREAN SECTION   . IR FLUORO GUIDE PORT INSERTION RIGHT  01/17/2018 . IR US GUIDE VASC ACCESS RIGHT  01/17/2018 . REDUCTION MAMMAPLASTY Bilateral 1969 . REPLACEMENT TOTAL KNEE    LEFT KNEE . TONSILLECTOMY   . TUBAL LIGATION  1975  BILATERAL HPI: HTN, HLD, CLL, CKD 3, diastolic CHF, not on supplemental oxygen at baseline.   She was last hospitalized from 2/16 to 2/22 for multifocal pneumonia, diastolic CHF exacerbation treated with  antibiotics, diuresed with Lasix and discharged to ALF without oxygen.  BSE completed yesterday and pt demonstrated no indications of aspiration.  Diet was not modified and MBS/ba swallow ordered for 12/03/2020.  Subjective: pt in chair Assessment / Plan / Recommendation CHL IP CLINICAL IMPRESSIONS 12/31/2020 Clinical Impression Pt presents with normal oropharyngeal swallow ability. No aspiration and flash penetration of thin via cup x1 was [facility].  Swallow is strong and timely with no oropharyngeal retention.   Of note, pt did not cough during MBS, but did cough before and after.  No SLP follow up needed.  Advised pt to  findings/recommendations. SLP Visit Diagnosis Dysphagia, unspecified (R13.10) Attention and concentration deficit following -- Frontal lobe and executive function deficit following -- Impact on safety and function Mild aspiration risk   CHL IP TREATMENT RECOMMENDATION 12/31/2020 Treatment Recommendations Defer until completion of intrumental exam   Prognosis 12/31/2020 Prognosis for Safe Diet Advancement Good Barriers to Reach Goals -- Barriers/Prognosis Comment -- CHL IP DIET RECOMMENDATION 12/31/2020 SLP Diet Recommendations Regular solids;Thin liquid Liquid Administration via Cup;Straw Medication Administration Whole meds with liquid Compensations Slow rate;Small sips/bites Postural Changes Remain semi-upright after after feeds/meals (Comment);Seated upright at 90 degrees   CHL IP OTHER RECOMMENDATIONS 12/31/2020 Recommended Consults -- Oral Care Recommendations Oral care BID Other Recommendations --   CHL IP FOLLOW UP RECOMMENDATIONS 12/31/2020 Follow up Recommendations None   No flowsheet data found.     CHL IP ORAL PHASE 12/31/2020 Oral Phase Impaired Oral - Pudding Teaspoon -- Oral - Pudding Cup -- Oral - Honey Teaspoon -- Oral - Honey Cup -- Oral - Nectar Teaspoon -- Oral - Nectar Cup -- Oral - Nectar Straw WFL Oral - Thin Teaspoon -- Oral - Thin Cup WFL Oral - Thin Straw WFL Oral - Puree WFL Oral -  Mech Soft -- Oral - Regular WFL Oral - Multi-Consistency -- Oral - Pill -- Oral Phase - Comment --  CHL IP PHARYNGEAL PHASE 12/31/2020 Pharyngeal Phase Impaired Pharyngeal- Pudding Teaspoon -- Pharyngeal -- Pharyngeal- Pudding Cup -- Pharyngeal -- Pharyngeal- Honey Teaspoon -- Pharyngeal -- Pharyngeal- Honey Cup -- Pharyngeal -- Pharyngeal- Nectar Teaspoon -- Pharyngeal -- Pharyngeal- Nectar Cup -- Pharyngeal -- Pharyngeal- Nectar Straw WFL Pharyngeal -- Pharyngeal- Thin Teaspoon -- Pharyngeal -- Pharyngeal- Thin Cup [facility] Pharyngeal Material enters airway, remains ABOVE vocal cords then ejected out Pharyngeal- Thin Straw WFL Pharyngeal -- Pharyngeal- Puree WFL Pharyngeal -- Pharyngeal- Mechanical Soft NT Pharyngeal -- Pharyngeal- Regular WFL Pharyngeal -- Pharyngeal- Multi-consistency -- Pharyngeal -- Pharyngeal- Pill -- Pharyngeal -- Pharyngeal Comment --  CHL IP CERVICAL ESOPHAGEAL PHASE 12/31/2020 Cervical Esophageal Phase WFL Pudding Teaspoon -- Pudding Cup -- Honey Teaspoon -- Honey Cup -- Nectar Teaspoon -- Nectar Cup -- Nectar Straw -- Thin Teaspoon -- Thin Cup -- Thin Straw -- Puree -- Mechanical Soft -- Regular -- Multi-consistency -- Pill -- Cervical Esophageal Comment -- Kathleen Lime, MS [facility] SLP Acute Rehab Services Office [phone] Pager [phone] Macario Golds 12/31/2020, 9:52 AM              ECHOCARDIOGRAM COMPLETE  Result Date: 12/08/2020    ECHOCARDIOGRAM REPORT   Patient Name:   Erin Moore Date of Exam: 12/08/2020 Medical Rec #:  [id number]      Height:       62.0 in Accession #:    [id number]     Weight:       214.6 lb Date of Birth:  18-Mar-1946      BSA:          1.970 m Patient Age:    19 years       BP:           132/59 mmHg Patient Gender: F              HR:           66 bpm. Exam Location:  Inpatient Procedure: 2D Echo Indications:    Dyspnea R06.00  History:        Patient has no prior history of Echocardiogram examinations.  Risk Factors:Hypertension and Dyslipidemia.   Sonographer:    Mikki Santee RDCS (AE) Referring Phys: [place]  1. Left ventricular ejection fraction, by estimation, is 60 to 65%. The left ventricle has normal function. The left ventricle has no regional wall motion abnormalities. Left ventricular diastolic parameters are consistent with Grade I diastolic dysfunction (impaired relaxation).  2. Right ventricular systolic function is normal. The right ventricular size is normal.  3. Mild mitral valve regurgitation  4. inferior vena cava is normal in size with greater than 50% respiratory variability, suggesting right atrial pressure of 3 mmHg. FINDINGS  Left Ventricle: Left ventricular ejection fraction, by estimation, is 60 to 65%. The left ventricle has normal function. The left ventricle has no regional wall motion abnormalities. The left ventricular internal cavity size was normal in size. There is  no left ventricular hypertrophy. Left ventricular diastolic parameters are consistent with Grade I diastolic dysfunction (impaired relaxation). Right Ventricle: The right ventricular size is normal. Right vetricular wall thickness was not assessed. Right ventricular systolic function is normal. Left Atrium: Left atrial size was normal in size. Right Atrium: Right atrial size was normal in size. Pericardium: There is no evidence of pericardial effusion. Mitral Valve: The mitral valve is abnormal. There is mild thickening of the mitral valve leaflet(s). Mild mitral valve regurgitation. Tricuspid Valve: The tricuspid valve is normal in structure. Tricuspid valve regurgitation is trivial. Aortic Valve: The aortic valve is tricuspid. Aortic valve regurgitation is not visualized. Mild aortic valve sclerosis is present, with no evidence of aortic valve stenosis. Pulmonic Valve: The pulmonic valve was not well visualized. Pulmonic valve regurgitation is not visualized. Aorta: The aortic root is normal in size and structure. Venous: The  inferior vena cava is normal in size with greater than 50% respiratory variability, suggesting right atrial pressure of 3 mmHg. IAS/Shunts: The interatrial septum was not assessed.  LEFT VENTRICLE PLAX 2D LVIDd:         5.00 cm  Diastology LVIDs:         3.10 cm  LV e' medial:    5.55 cm/s LV PW:         1.00 cm  LV E/e' medial:  15.6 LV IVS:        0.90 cm  LV e' lateral:   7.51 cm/s LVOT diam:     2.00 cm  LV E/e' lateral: 11.5 LV SV:         88 LV SV Index:   45 LVOT Area:     3.14 cm  RIGHT VENTRICLE RV S prime:     16.30 cm/s TAPSE (M-mode): 2.6 cm LEFT ATRIUM             Index       RIGHT ATRIUM           Index LA diam:        3.80 cm 1.93 cm/m  RA Area:     23.40 cm LA Vol (A2C):   43.0 ml 21.83 ml/m RA Volume:   74.60 ml  37.87 ml/m LA Vol (A4C):   57.2 ml 29.04 ml/m LA Biplane Vol: 50.9 ml 25.84 ml/m  AORTIC VALVE LVOT Vmax:   126.00 cm/s LVOT Vmean:  85.800 cm/s LVOT VTI:    0.280 m  AORTA Ao Root diam: 2.80 cm MITRAL VALVE MV Area (PHT): 3.27 cm     SHUNTS MV Decel Time: 232 msec     Systemic VTI:  0.28 m MV E velocity:  86.60 cm/s   Systemic Diam: 2.00 cm MV A velocity: 114.00 cm/s MV E/A ratio:  0.76 Dorris Carnes MD Electronically signed by Dorris Carnes MD Signature Date/Time: 12/08/2020/4:17:05 PM    Final    DG ESOPHAGUS W SINGLE CM (SOL OR THIN BA)  Result Date: 12/31/2020 CLINICAL DATA:  Chronic cough. EXAM: ESOPHOGRAM/BARIUM SWALLOW TECHNIQUE: Single contrast examination was performed using  thin barium. FLUOROSCOPY TIME:  Fluoroscopy Time:  42 seconds Radiation Exposure Index (if provided by the fluoroscopic device): 1.9 mGy Number of Acquired Spot Images: 0 COMPARISON:  CTA chest of 12/03/2020. FINDINGS: Focused, single-contrast exam performed with the patient in a minimally oblique supine to LPO position. Mild limitations secondary to patient clinical status, immobility. Evaluation of esophageal peristalsis demonstrates an incomplete primary peristaltic wave with proximal escape waves and  contrast stasis in the mid and upper esophagus. Full column evaluation of the esophagus demonstrates no persistent narrowing to suggest stricture. A 13 mm barium tablet passes promptly. IMPRESSION: 1. Esophageal dysmotility, likely presbyesophagus. 2. No evidence of esophageal stricture. Electronically Signed   By: Abigail Miyamoto M.D.   On: 12/31/2020 09:46

## 2020-12-31 NOTE — Progress Notes (Signed)
   NAME:  Erin Moore, MRN:  [id number], DOB:  [birth year], LOS: 2 ADMISSION DATE:  12/29/2020, CONSULTATION DATE:  3/15 REFERRING MD:  Alvy Bimler, CHIEF COMPLAINT:  Abnormal cxr   History of Present Illness:  75 year old female who was admitted on 3/24 w/ about 1 week h/o  worsening SOB. Initial Pulse ox 74% on room air. WBC ct 144 K (felt 2/2 her acalabrutinib), RVP neg. CXR w/ diffuse patchy bilateral airspace disease. BNP 115. PCT <<0.10. started on ABX and IV diuresis.  PCCM asked to see 3/15 by oncology to consider bronchoscopy for atypical PNA the setting of immunocompromised state.  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 Started on Acalabrutinib 2021 Nov Pancytopenia  Diastolic HF, HTN  CKD stage III Admitted for HF Feb 2022  Significant Hospital Events:  3/14 admitted. PCT neg. BNP only in 100s. Bilateral patchy airspace disease. Got lasix. Started on cefepime. o2 needs 6lpm 3/15 I&O balance -1.1 liters. PCT still neg. Acyclovir added. PCCM consulted by Onc. Wanting FOB still on 6 liters  3/16 esophagram + sig dysmotility MBS neg for dysphagia  Consults:  ONC Pulm   Procedures:    Significant Diagnostic Tests:   Micro Data:  RVP: neg   Antimicrobials:  Cefepime 3/14 Acyclovir 3/15>>>  Interim History / Subjective:  Still has sig cough   Objective   Blood pressure 109/60, pulse 68, temperature 97.6 F (36.4 C), temperature source Oral, resp. rate 18, height 5\' 2"  (1.575 m), weight 99.8 kg, SpO2 92 %.        Intake/Output Summary (Last 24 hours) at 12/31/2020 0955 Last data filed at 12/31/2020 0754 Gross per 24 hour  Intake 208.61 ml  Output 3500 ml  Net -3291.39 ml   Filed Weights   12/29/20 1245 12/29/20 1536  Weight: 100 kg 99.8 kg    Examination:  General 75 year old female resting in bed. No distress still has sig cough that is wet and non-productive  HENT coarse upper airway noises, w/  marked pseudo-wheeze . No JVD. MMM pulm basilar rales. Marked pseudo-wheeze  Card rrr abd soft  Ext warm trace edema  Neuro intact    Resolved Hospital Problem list     Assessment & Plan:  CLL Chronic cough  Diastolic HF Pulmonary edema Acute hypoxic respiratory failure w/ patchy bilateral pulmonary infiltrates Esophageal dysmotility  HTN  CKD stage III   Acute Hypoxic Respiratory Failure 2/2 recurrent diffuse pulmonary infiltrates.  Esophageal Dysmotility    -Given her esophagram findings I think this most likely is a mix of pulmonary edema and recurrent aspiration. Seemingly less likely lymphangitic involvement, drug induced pneumonitis or atypical or fungal process.  Her cough is classic for recurrent reflux also adding to the argument for this especially so shortly after swallowing eval Plan Would cont IV diuresis as BP/BUN/cr allow F/u aspergillus and fungitell Reflux precautions (this is key)  Cont cefepime day 3 would complete 5-7 days pcxr today prob can hold off on bronch   Critical care time: NA    Erick Colace ACNP-BC [place] Pager # [phone] OR # [phone] if no answer

## 2020-12-31 NOTE — Progress Notes (Signed)
MBS completed.  Full report to follow.  Pt presents with normal oropharyngeal swallow ability. No aspiration and flash penetration of thin x1 was [facility].   No SLP follow up indicated after full report documented.    Kathleen Lime, MS [facility] SLP Acute Rehab Services Office [phone] Pager [phone]

## 2020-12-31 NOTE — Plan of Care (Signed)
°  Problem: Clinical Measurements: °Goal: Will remain free from infection °Outcome: Progressing °Goal: Cardiovascular complication will be avoided °Outcome: Progressing °  °Problem: Coping: °Goal: Level of anxiety will decrease °Outcome: Progressing °  °Problem: Safety: °Goal: Ability to remain free from injury will improve °Outcome: Progressing °  °Problem: Skin Integrity: °Goal: Risk for impaired skin integrity will decrease °Outcome: Progressing °  °

## 2020-12-31 NOTE — Plan of Care (Signed)
  Problem: Education: Goal: Knowledge of General Education information will improve Description: Including pain rating scale, medication(s)/side effects and non-pharmacologic comfort measures Outcome: Progressing   Problem: Clinical Measurements: Goal: Will remain free from infection Outcome: Progressing Goal: Respiratory complications will improve Outcome: Progressing   Problem: Activity: Goal: Risk for activity intolerance will decrease Outcome: Progressing   Problem: Safety: Goal: Ability to remain free from injury will improve Outcome: Progressing   Problem: Skin Integrity: Goal: Risk for impaired skin integrity will decrease Outcome: Progressing   

## 2021-01-01 ENCOUNTER — Inpatient Hospital Stay (HOSPITAL_COMMUNITY): Payer: Medicare Other

## 2021-01-01 ENCOUNTER — Other Ambulatory Visit (HOSPITAL_COMMUNITY): Payer: Self-pay | Admitting: Internal Medicine

## 2021-01-01 DIAGNOSIS — Z9981 Dependence on supplemental oxygen: Secondary | ICD-10-CM

## 2021-01-01 LAB — CBC
HCT: 30.1 % — ABNORMAL LOW (ref 36.0–46.0)
Hemoglobin: 8.3 g/dL — ABNORMAL LOW (ref 12.0–15.0)
MCH: 27.7 pg (ref 26.0–34.0)
MCHC: 27.6 g/dL — ABNORMAL LOW (ref 30.0–36.0)
MCV: 100.3 fL — ABNORMAL HIGH (ref 80.0–100.0)
Platelets: 123 10*3/uL — ABNORMAL LOW (ref 150–400)
RBC: 3 MIL/uL — ABNORMAL LOW (ref 3.87–5.11)
RDW: 17.2 % — ABNORMAL HIGH (ref 11.5–15.5)
WBC: 120.1 10*3/uL (ref 4.0–10.5)
nRBC: 0 % (ref 0.0–0.2)

## 2021-01-01 LAB — BASIC METABOLIC PANEL
Anion gap: 11 (ref 5–15)
BUN: 30 mg/dL — ABNORMAL HIGH (ref 8–23)
CO2: 31 mmol/L (ref 22–32)
Calcium: 9.1 mg/dL (ref 8.9–10.3)
Chloride: 102 mmol/L (ref 98–111)
Creatinine, Ser: 1.45 mg/dL — ABNORMAL HIGH (ref 0.44–1.00)
GFR, Estimated: 38 mL/min — ABNORMAL LOW (ref >60)
Glucose, Bld: 105 mg/dL — ABNORMAL HIGH (ref 70–99)
Potassium: 3.8 mmol/L (ref 3.5–5.1)
Sodium: 144 mmol/L (ref 135–145)

## 2021-01-01 LAB — FUNGITELL, SERUM: Fungitell Result: <31 pg/mL (ref <80)

## 2021-01-01 LAB — BRAIN NATRIURETIC PEPTIDE: B Natriuretic Peptide: 74.7 pg/mL (ref 0.0–100.0)

## 2021-01-01 MED ORDER — FUROSEMIDE 10 MG/ML IJ SOLN
40.0000 mg | Freq: Every day | INTRAMUSCULAR | Status: DC
  Administered 2021-01-01: 40 mg via INTRAVENOUS
  Filled 2021-01-01: qty 4

## 2021-01-01 MED ORDER — FUROSEMIDE 40 MG PO TABS: 40.0000 mg | ORAL_TABLET | Freq: Every day | ORAL | Status: DC

## 2021-01-01 MED ORDER — AMOXICILLIN-POT CLAVULANATE 875-125 MG PO TABS: 1.0000 | ORAL_TABLET | Freq: Two times a day (BID) | ORAL | 0 refills | Status: DC

## 2021-01-01 MED ORDER — HEPARIN SOD (PORK) LOCK FLUSH 100 UNIT/ML IV SOLN
500.0000 [IU] | INTRAVENOUS | Status: DC
  Filled 2021-01-01: qty 5

## 2021-01-01 MED ORDER — HEPARIN SOD (PORK) LOCK FLUSH 100 UNIT/ML IV SOLN
500.0000 [IU] | INTRAVENOUS | Status: DC | PRN
  Administered 2021-01-01: 500 [IU]
  Filled 2021-01-01 (×2): qty 5

## 2021-01-01 MED ORDER — FUROSEMIDE 10 MG/ML IJ SOLN: 40.0000 mg | Freq: Every day | INTRAMUSCULAR | Status: DC

## 2021-01-01 MED ORDER — FUROSEMIDE 40 MG PO TABS: 40.0000 mg | ORAL_TABLET | Freq: Every day | ORAL | 2 refills | Status: DC

## 2021-01-01 NOTE — Progress Notes (Signed)
Pharmacy Antibiotic Note  Erin Moore is a 75 y.o. 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 She presented to the ED on 12/29/2020 with c/o SOB and cough. CXR showed findings consistent with multifocal PNA. Pharmacy has been consulted to start cefepime for infection. 01/01/2021 D#3 full abx tx Possible pulm lymphoma vs CHF vs atypical PNA PCT neg x 3 days, AF, on Bactrim PTA for PJP px WBC elevated 2nd CLL/[place], SCr up 1.45 CCM: too unstable for bronch now, rec diurese, once euvolemic, repeat CT chest, fungitell, serum galactomannam sent - in process Esophageal dysmotility- prob mix pulm edema & recurrent aspiration,  CCM rec 5-7 days cefepime  Plan: continue cefepime 2 gm IV q12h F/u LOT, rec short course  ___________________________________________  Height: 5\' 2"  (157.5 cm) Weight: 96.3 kg (212 lb 3.2 oz) IBW/kg (Calculated) : 50.1  Temp (24hrs), Avg:98.5 F (36.9 C), Min:98.5 F (36.9 C), Max:98.6 F (37 C)  Recent Labs  Lab 12/29/20 1258 12/30/20 0319 12/31/20 0427 01/01/21 0429  WBC 144.5* 131.3* 124.1* 120.1*  CREATININE 1.29* 1.37* 1.25* 1.45*  LATICACIDVEN 0.6  --   --   --     Estimated Creatinine Clearance: 36.3 mL/min (A) (by C-G formula based on SCr of 1.45 mg/dL (H)).    Allergies  Allergen Reactions  . Azithromycin Hives    Hives and fever  . Ibuprofen Other (See Comments) and Swelling    Hives    3/14 cefepime>> Septra MWF/acyclovir as PTA>>  3/14 BCx2: ngtd 3/14 MRSA pcr: neg 3/14 flu/covid - 3/15 aspergilus ag: sent 3/15 fungitell: sent  Thank you for allowing pharmacy to be a part of this patient's care.  Eudelia Bunch, Pharm.D 01/01/2021 8:57 AM

## 2021-01-01 NOTE — TOC Progression Note (Signed)
Transition of Care [facility]) - Progression Note    Patient Details  Name: Erin Moore MRN: [id number] Date of Birth: Feb 19, 1946  [facility]) CM/SW Contact  Purcell Mouton, RN Phone Number: 01/01/2021, 3:07 PM  Clinical Narrative:    Spoke with Wells Guiles, RN at [facility] [place] [place], Oxygen will need to be delivered before pt may return. Referral given to in house rep.         Expected Discharge Plan and Services           Expected Discharge Date: 01/01/21                                     Social Determinants of Health (SDOH) Interventions    Readmission Risk Interventions Readmission Risk Prevention Plan 12/04/2020  Transportation Screening Complete  PCP or Specialist Appt within 5-7 Days Complete  Home Care Screening Complete  Some recent data might be hidden

## 2021-01-01 NOTE — Care Management Important Message (Signed)
Important Message  Patient Details IM Letter given to the Patient. Name: Erin Moore MRN: [id number] Date of Birth: 1946/04/27   Medicare Important Message Given:  Yes     [name] 01/01/2021, 1:00 PM

## 2021-01-01 NOTE — Progress Notes (Signed)
NAME:  Erin Moore, MRN:  [id number], DOB:  [birth year], LOS: 3 ADMISSION DATE:  12/29/2020, CONSULTATION DATE:  3/15 REFERRING MD:  Alvy Bimler, CHIEF COMPLAINT:  Abnormal cxr   History of Present Illness:  75 year old female who was admitted on 3/24 w/ about 1 week h/o  worsening SOB. Initial Pulse ox 74% on room air. WBC ct 144 K (felt 2/2 her acalabrutinib), RVP neg. CXR w/ diffuse patchy bilateral airspace disease. BNP 115. PCT <<0.10. started on ABX and IV diuresis.  PCCM asked to see 3/15 by oncology to consider bronchoscopy for atypical PNA the setting of immunocompromised state.  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 Started on Acalabrutinib 2021 Nov Pancytopenia  Diastolic HF, HTN  CKD stage III Admitted for HF Feb 2022  Significant Hospital Events:  3/14 admitted. PCT neg. BNP only in 100s. Bilateral patchy airspace disease. Got lasix. Started on cefepime. o2 needs 6lpm 3/15 I&O balance -1.1 liters. PCT still neg. Acyclovir added. PCCM consulted by Onc. Wanting FOB still on 6 liters  3/16 esophagram + sig dysmotility MBS neg for dysphagia  Consults:  ONC Pulm   Procedures:    Significant Diagnostic Tests:   Micro Data:  RVP: neg   Antimicrobials:  Cefepime 3/14 Acyclovir 3/15>>>  Interim History / Subjective:  Feels better.  She had a rough night, was up several times needing to use the bathroom  Objective   Blood pressure (Abnormal) 129/54, pulse 70, temperature 98.5 F (36.9 C), temperature source Oral, resp. rate 20, height 5\' 2"  (1.575 m), weight 96.3 kg, SpO2 95 %.        Intake/Output Summary (Last 24 hours) at 01/01/2021 1003 Last data filed at 01/01/2021 0518 Gross per 24 hour  Intake 431.5 ml  Output 1950 ml  Net -1518.5 ml   Filed Weights   12/29/20 1245 12/29/20 1536 01/01/21 0451  Weight: 100 kg 99.8 kg 96.3 kg    Examination:  General 75 year old female now sitting up in chair  she is in no acute distress HEENT normocephalic atraumatic upper airway noises remarkably improved Pulmonary bibasilar rales currently 5 L/min Cardiac regular rate and rhythm Abdomen soft not tender Extremities warm dry Neuro intact and at baseline  Resolved Hospital Problem list     Assessment & Plan:  CLL Chronic cough  Diastolic HF Pulmonary edema Acute hypoxic respiratory failure w/ patchy bilateral pulmonary infiltrates Esophageal dysmotility  HTN  CKD stage III   Acute Hypoxic Respiratory Failure 2/2 recurrent diffuse pulmonary infiltrates secondary to aspiration pneumonitis from significant Esophageal Dysmotility, volume overload, and cannot exclude lymphangitic spread of her CLL  -She continues to require significant oxygen, desaturates at at bedtime raising concern for some degree of OSA -Standard chest x-ray personally reviewed showed improved aeration, CT imaging personally reviewed a little bit worse when comparing previous CT scan in February.  Has bronchiectatic changes in the bases, chronically atelectatic right middle lobe, and some new involvement of the left upper lobe which could be more consistent with a malignancy -Her Aspergillus and Fungitell are still pending -Seemingly this is less likely to be a pneumonitis from her drug  Plan No role for steroids  Would complete 5 days of antibiotics  Still reasonable to push diuresis as blood pressure BUN/creatinine tolerate, she may be close to euvolemic  Follow-up Aspergillus and Fungitell, could empirically treat for fungal although radiographically not really consistent with this  Reflux precautions, this is key she  needs to be upright for at least 2 hours following meals  She does not need dysphagia precautions  Given her supplemental oxygen requirements we are not recommending bronchoscopy    Critical care time: NA    Erick Colace ACNP-BC [place] Pager # [phone] OR # [phone] if  no answer

## 2021-01-01 NOTE — Progress Notes (Addendum)
Erin Moore   DOB:Jul 13, 1946   UX#:[id number]    I have seen the patient twice today, examined her and discussed and reviewed plan with her and her sister ASSESSMENT & PLAN:  CLL (chronic lymphocytic leukemia) ([place]) She has persistent leukocytosis but this is not unexpected side effects from acalabrutinib Treatment currently on hold Overall, clinically, she appears to be responding to treatment   Acute respiratory failure with hypoxia Presented with worsening shortness of breath, cough, hypoxia Chest x-ray suggestive of multifocal pneumonia versus CHF exacerbation versus lymphangitic spread of lymphoma In my opinion, this is not consistent with diagnosis of lymphoma/CLL She had numerous courses of antibiotics in the past From the prior hospitalization, she diuresed almost 11 L of fluid out and certainly volume overload could give similar picture Continue antibiotics for possible pneumonia per hospitalist Recommend aggressive diuresis  No aspiration is noted during swallow eval CT chest performed this morning and results show possibility of atypical infection Awaiting guidance from pulmonologist   Pancytopenia, acquired ([place]) The cause of her pancytopenia is multifactorial  Monitor, she does not need transfusion support   Acute diastolic CHF (congestive heart failure) ([place]) Last echocardiogram was performed on 12/08/2020 which showed an LVEF of 60 to 65% Respiratory status improved significantly with aggressive diuresis last admission and she lost about 10 pounds Continue diuresis per hospitalist; BNP is now normal Appreciate cardiologist management   CKD (chronic kidney disease), symptom management only, stage 3 (moderate) ([place]) She has slight elevated serum creatinine from baseline but likely due to diuretic therapy For now, I recommend she continues the same   Goals of care  Due to the patient's mild baseline mental deficit, her sister/HC POA makes all of her medical  decisions I have updated her sister with the plan of care and she agreed with the plan of care   Discharge planning Anticipate she will be hospitalized for at least 3 to 4 days All questions were answered. The patient knows to call the clinic with any problems, questions or concerns.   Heath Lark, MD 01/01/2021 2:44 PM Heath Lark, MD  Subjective:  She tells me that she feels better today She is not short of breath that she has been previously Still has a cough with clear sputum production O2 currently at 5 L/min via nasal cannula Had CT of the chest performed earlier today and results pending  Objective:  Vitals:   01/01/21 1435 01/01/21 1444  BP:  139/82  Pulse:  71  Resp:  16  Temp:  (!) 97.4 F (36.3 C)  SpO2: 94% 97%     Intake/Output Summary (Last 24 hours) at 01/01/2021 1444 Last data filed at 01/01/2021 0518 Gross per 24 hour  Intake 431.5 ml  Output 1450 ml  Net -1018.5 ml    GENERAL:alert, no distress and comfortable SKIN: She looks a bit pale LUNGS: Diffuse scattered wheezes.  She has oxygen delivered via nasal cannula HEART: regular rate & rhythm and no murmurs ABDOMEN:abdomen soft, non-tender and normal bowel sounds Musculoskeletal:no cyanosis of digits and no clubbing  NEURO: alert & oriented x 3 with fluent speech, no focal motor/sensory deficits   Labs:  Recent Labs    05/05/20 1007 06/09/20 1037 07/21/20 1120 08/25/20 1129 12/04/20 0313 12/05/20 0310 12/17/20 1037 12/29/20 1258 12/30/20 0319 12/31/20 0427 01/01/21 0429  NA 142 140 142   < > 144   < > 145 141 141 143 144  K 4.4 3.8 4.2   < > 5.2*   < >  4.1 4.9 4.6 4.3 3.8  CL 103 103 105   < > 108   < > 107 105 103 103 102  CO2 26 27 29    < > 28   < > 27 27 29 28 31   GLUCOSE 106* 106* 102*   < > 137*   < > 91 112* 108* 106* 105*  BUN 29* 22 26*   < > 25*   < > 21 22 26* 28* 30*  CREATININE 1.18* 1.29* 1.26*   < > 1.39*   < > 1.42* 1.29* 1.37* 1.25* 1.45*  CALCIUM 9.7 9.7 9.5   < > 9.1   <  > 9.1 8.9 7.9* 8.9 9.1  GFRNONAA 45* 41* 42*   < > 40*   < > 39* 43* 40* 45* 38*  GFRAA 53* 47* 49*  --   --   --   --   --   --   --   --   PROT 6.3* 5.9* 6.1*   < > 5.9*  --  5.8* 5.6*  --   --   --   ALBUMIN 4.3 3.7 3.8   < > 3.8  --  3.7 3.5  --   --   --   AST 11* 14* 19   < > 13*  --  10* 14*  --   --   --   ALT 12 10 14    < > 16  --  10 8  --   --   --   ALKPHOS 79 86 83   < > 102  --  99 86  --   --   --   BILITOT 0.4 0.4 0.5   < > 0.9  --  0.4 0.5  --   --   --    < > = values in this interval not displayed.    Studies: I have personally reviewed the CT imaging DG Chest 2 View  Result Date: 12/07/2020 CLINICAL DATA:  75 year old female with a history of CLL and hypoxia EXAM: CHEST - 2 VIEW COMPARISON:  Chest CT 12/03/2020, plain film 11/11/2020 FINDINGS: Cardiomediastinal silhouette unchanged in size and contour. Rounded opacity in the right hilar region, corresponding to adenopathy on the contemporaneous chest CT. Pattern of reticulonodular opacities of the bilateral lungs, corresponding to findings on prior CT. On the lateral view there is partial volume loss/consolidation of the right middle lobe, present on the CT. Minimal blunting of the right costophrenic angle. No pneumothorax. Right IJ port catheter. IMPRESSION: Reticulonodular opacities, compatible with multifocal infection and present on recent CT. As was noted, pulmonary lymphoma considered less likely. Partial volume loss/consolidation of the right middle lobe, present on comparison CT. Trace right-sided pleural fluid. Unchanged right port catheter. Electronically Signed   By: Corrie Mckusick D.O.   On: 12/07/2020 15:25   CT CHEST WO CONTRAST  Result Date: 01/01/2021 CLINICAL DATA:  Respiratory failure EXAM: CT CHEST WITHOUT CONTRAST TECHNIQUE: Multidetector CT imaging of the chest was performed following the standard protocol without IV contrast. COMPARISON:  12/03/2020 FINDINGS: Cardiovascular: Normal heart size. No  pericardial effusion. Extensive coronary atherosclerosis. Unremarkable right-sided porta catheter. Mediastinum/Nodes: Bulky adenopathy in the bilateral axilla, mediastinum, and hila. A subcarinal node measures 3 cm in short axis. No clear change from recent prior. Lungs/Pleura: Ground-glass, micro nodular, and consolidative opacities in the bilateral lungs. Areas of consolidation have improved at the lateral segment right middle lobe and in the left lower lobe although there  is new areas of consolidation seen in the right lower lobe. Progressive ground-glass opacity in the left upper lobe especially. There is generalized tree in bud pattern from airspace filling. Mild cylindrical bronchiectasis in the left lower lobe. Small right pleural effusion. No cavitating disease. Upper Abdomen: Splenomegaly and upper retroperitoneal adenopathy. Musculoskeletal: Negative IMPRESSION: 1. Multifocal airspace disease with areas of both improvement and progression when compared to CT last month. The evolution is more consistent with an infectious/inflammatory process than pulmonary lymphoma. Given the mixed findings question 2 processes or atypical pathogen. 2. Adenopathy and splenomegaly in the setting of CLL. Electronically Signed   By: Monte Fantasia M.D.   On: 01/01/2021 10:56   CT Angio Chest PE W and/or Wo Contrast  Result Date: 12/03/2020 CLINICAL DATA:  Concern for pulmonary embolism. History of chronic lymphocytic leukemia. EXAM: CT ANGIOGRAPHY CHEST WITH CONTRAST TECHNIQUE: Multidetector CT imaging of the chest was performed using the standard protocol during bolus administration of intravenous contrast. Multiplanar CT image reconstructions and MIPs were obtained to evaluate the vascular anatomy. CONTRAST:  61mL OMNIPAQUE IOHEXOL 350 MG/ML SOLN COMPARISON:  CT 08/11/2020 FINDINGS: Cardiovascular: No filling defects within the pulmonary arteries to suggest acute pulmonary embolism. Mediastinum/Nodes: Bulky mediastinal  perihilar lymphadenopathy again noted. For example lymph node along the RIGHT bronchus intermedius measuring 25 mm compares to 25 mm. RIGHT lower paratracheal node measuring 18 mm compares to 15 mm. Lungs/Pleura: There is patchy bilateral nodular airspace disease which is worsened from comparison exam. Consolidation and peripheral nodularity in the posterior RIGHT upper lobe on image 65/6 is worsened. There is a consolidation at the LEFT lung base measuring 3.4 cm (image 88/6) which is new. There is atelectasis in the RIGHT middle lobe slightly increased. There is a RIGHT pleural effusion which is new. W Ithin the lung apices there is patchy nodular airspace disease also worsened from comparison exam. Upper Abdomen: A periaortic retroperitoneal adenopathy similar to prior. Spleen appears enlarged. Musculoskeletal: No aggressive osseous lesion Review of the MIP images confirms the above findings. IMPRESSION: 1. No acute pulmonary embolism. 2. New bilateral patchy, nodular and consolidative airspace disease most consistent multifocal pneumonia including atypical pneumonia. Secondary differential would include a pulmonary lymphoma, less favored. 3. Bulky mediastinal and axillary lymphadenopathy related to CLL. No change from prior. 4. Splenomegaly and upper abdominal adenopathy related to CLL. Electronically Signed   By: Suzy Bouchard M.D.   On: 12/03/2020 13:41   DG Chest Port 1 View  Result Date: 12/31/2020 CLINICAL DATA:  Evaluate for pneumonia. EXAM: PORTABLE CHEST 1 VIEW COMPARISON:  Chest x-ray dated December 29, 2020. FINDINGS: Unchanged right chest wall port catheter. Normal heart size. Unchanged bilateral hilar lymphadenopathy related to CLL. Improving airspace disease in both lungs with decreased right pleural effusion. No pneumothorax. No acute osseous abnormality. IMPRESSION: 1. Improving multifocal pneumonia. Decreasing small right pleural effusion. Electronically Signed   By: Titus Dubin M.D.    On: 12/31/2020 15:10   DG Chest Port 1 View  Result Date: 12/29/2020 CLINICAL DATA:  Shortness of breath and productive cough x1 week. EXAM: PORTABLE CHEST 1 VIEW COMPARISON:  Chest radiograph December 07, 2020 FINDINGS: Accessed right chest wall port with tip overlying the right atrium. The heart size and mediastinal contours are within unchanged. Nodular hilar contours similar to prior likely related to known bulky mediastinal adenopathy secondary to CLL. Bilateral patchy consolidations are again present with a new left midlung consolidation. Small right pleural effusion. The visualized skeletal structures are unchanged. IMPRESSION: 1.  Bilateral patchy consolidations with new left midlung consolidation, findings suggestive of multifocal pneumonia. However pulmonary lymphoma slightly more suspicious on the differential in comparison prior imaging given persistence with interval increase in airspace opacities despite presumed treatment. 2. Diffuse interstitial opacities which is most likely related to pulmonary edema or an infectious process. However, in the setting of pulmonary lymphoma this would be suspicious for lymphangitic spread. 3. Small right pleural effusion increased compared to prior. Electronically Signed   By: Dahlia Bailiff MD   On: 12/29/2020 13:58   DG Swallowing Func-Speech Pathology  Result Date: 12/31/2020 Objective Swallowing Evaluation: Type of Study: MBS-Modified Barium Swallow Study  Patient Details Name: Erin Moore MRN: [id number] Date of Birth: May 31, 1946 Today's Date: 12/31/2020 Time: SLP Start Time (ACUTE ONLY): 0840 -SLP Stop Time (ACUTE ONLY): 8756 SLP Time Calculation (min) (ACUTE ONLY): 10 min Past Medical History: Past Medical History: Diagnosis Date  Chronic lymphocytic leukemia (CLL), B-cell ([place]) 12/18/2013  CLL (chronic lymphocytic leukemia) ([place]) FALL OF 2011  CLL (chronic lymphocytic leukemia) ([place]) 06/02/2015  Depression   Fatigue 07/18/2013  Hyperlipemia   Hypertension    Learning disorder   Mental disability   Osteoarthritis   Pharyngitis 09/18/2013  Upper respiratory symptom 01/2018 Past Surgical History: Past Surgical History: Procedure Laterality Date  ABDOMINAL HYSTERECTOMY    BREAST BIOPSY Left 2011  BREAST REDUCTION SURGERY    CARPAL TUNNEL RELEASE    BILATERAL  CESAREAN SECTION    IR FLUORO GUIDE PORT INSERTION RIGHT  01/17/2018  IR US GUIDE VASC ACCESS RIGHT  01/17/2018  REDUCTION MAMMAPLASTY Bilateral 1969  REPLACEMENT TOTAL KNEE    LEFT KNEE  TONSILLECTOMY    TUBAL LIGATION  1975  BILATERAL HPI: HTN, HLD, CLL, CKD 3, diastolic CHF, not on supplemental oxygen at baseline.   She was last hospitalized from 2/16 to 2/22 for multifocal pneumonia, diastolic CHF exacerbation treated with antibiotics, diuresed with Lasix and discharged to ALF without oxygen.  BSE completed yesterday and pt demonstrated no indications of aspiration.  Diet was not modified and MBS/ba swallow ordered for 12/03/2020.  Subjective: pt in chair Assessment / Plan / Recommendation CHL IP CLINICAL IMPRESSIONS 12/31/2020 Clinical Impression Pt presents with normal oropharyngeal swallow ability. No aspiration and flash penetration of thin via cup x1 was [facility].  Swallow is strong and timely with no oropharyngeal retention.   Of note, pt did not cough during MBS, but did cough before and after.  No SLP follow up needed.  Advised pt to findings/recommendations. SLP Visit Diagnosis Dysphagia, unspecified (R13.10) Attention and concentration deficit following -- Frontal lobe and executive function deficit following -- Impact on safety and function Mild aspiration risk   CHL IP TREATMENT RECOMMENDATION 12/31/2020 Treatment Recommendations Defer until completion of intrumental exam   Prognosis 12/31/2020 Prognosis for Safe Diet Advancement Good Barriers to Reach Goals -- Barriers/Prognosis Comment -- CHL IP DIET RECOMMENDATION 12/31/2020 SLP Diet Recommendations Regular solids;Thin liquid Liquid Administration via Cup;Straw  Medication Administration Whole meds with liquid Compensations Slow rate;Small sips/bites Postural Changes Remain semi-upright after after feeds/meals (Comment);Seated upright at 90 degrees   CHL IP OTHER RECOMMENDATIONS 12/31/2020 Recommended Consults -- Oral Care Recommendations Oral care BID Other Recommendations --   CHL IP FOLLOW UP RECOMMENDATIONS 12/31/2020 Follow up Recommendations None   No flowsheet data found.     CHL IP ORAL PHASE 12/31/2020 Oral Phase Impaired Oral - Pudding Teaspoon -- Oral - Pudding Cup -- Oral - Honey Teaspoon -- Oral - Honey Cup -- Oral - Nectar  Teaspoon -- Oral - Nectar Cup -- Oral - Nectar Straw WFL Oral - Thin Teaspoon -- Oral - Thin Cup WFL Oral - Thin Straw WFL Oral - Puree WFL Oral - Mech Soft -- Oral - Regular WFL Oral - Multi-Consistency -- Oral - Pill -- Oral Phase - Comment --  CHL IP PHARYNGEAL PHASE 12/31/2020 Pharyngeal Phase Impaired Pharyngeal- Pudding Teaspoon -- Pharyngeal -- Pharyngeal- Pudding Cup -- Pharyngeal -- Pharyngeal- Honey Teaspoon -- Pharyngeal -- Pharyngeal- Honey Cup -- Pharyngeal -- Pharyngeal- Nectar Teaspoon -- Pharyngeal -- Pharyngeal- Nectar Cup -- Pharyngeal -- Pharyngeal- Nectar Straw WFL Pharyngeal -- Pharyngeal- Thin Teaspoon -- Pharyngeal -- Pharyngeal- Thin Cup [facility] Pharyngeal Material enters airway, remains ABOVE vocal cords then ejected out Pharyngeal- Thin Straw WFL Pharyngeal -- Pharyngeal- Puree WFL Pharyngeal -- Pharyngeal- Mechanical Soft NT Pharyngeal -- Pharyngeal- Regular WFL Pharyngeal -- Pharyngeal- Multi-consistency -- Pharyngeal -- Pharyngeal- Pill -- Pharyngeal -- Pharyngeal Comment --  CHL IP CERVICAL ESOPHAGEAL PHASE 12/31/2020 Cervical Esophageal Phase WFL Pudding Teaspoon -- Pudding Cup -- Honey Teaspoon -- Honey Cup -- Nectar Teaspoon -- Nectar Cup -- Nectar Straw -- Thin Teaspoon -- Thin Cup -- Thin Straw -- Puree -- Mechanical Soft -- Regular -- Multi-consistency -- Pill -- Cervical Esophageal Comment -- Kathleen Lime, MS [facility] SLP  Acute Rehab Services Office [phone] Pager [phone] Macario Golds 12/31/2020, 9:52 AM              ECHOCARDIOGRAM COMPLETE  Result Date: 12/08/2020    ECHOCARDIOGRAM REPORT   Patient Name:   Erin Moore Date of Exam: 12/08/2020 Medical Rec #:  [id number]      Height:       62.0 in Accession #:    [id number]     Weight:       214.6 lb Date of Birth:  [birth year]      BSA:          1.970 m Patient Age:    56 years       BP:           132/59 mmHg Patient Gender: F              HR:           66 bpm. Exam Location:  Inpatient Procedure: 2D Echo Indications:    Dyspnea R06.00  History:        Patient has no prior history of Echocardiogram examinations.                 Risk Factors:Hypertension and Dyslipidemia.  Sonographer:    Mikki Santee RDCS (AE) Referring Phys: [place]  1. Left ventricular ejection fraction, by estimation, is 60 to 65%. The left ventricle has normal function. The left ventricle has no regional wall motion abnormalities. Left ventricular diastolic parameters are consistent with Grade I diastolic dysfunction (impaired relaxation).  2. Right ventricular systolic function is normal. The right ventricular size is normal.  3. Mild mitral valve regurgitation  4. inferior vena cava is normal in size with greater than 50% respiratory variability, suggesting right atrial pressure of 3 mmHg. FINDINGS  Left Ventricle: Left ventricular ejection fraction, by estimation, is 60 to 65%. The left ventricle has normal function. The left ventricle has no regional wall motion abnormalities. The left ventricular internal cavity size was normal in size. There is  no left ventricular hypertrophy. Left ventricular diastolic parameters are consistent with Grade I diastolic dysfunction (impaired relaxation). Right Ventricle: The right ventricular size  is normal. Right vetricular wall thickness was not assessed. Right ventricular systolic function is normal. Left Atrium: Left  atrial size was normal in size. Right Atrium: Right atrial size was normal in size. Pericardium: There is no evidence of pericardial effusion. Mitral Valve: The mitral valve is abnormal. There is mild thickening of the mitral valve leaflet(s). Mild mitral valve regurgitation. Tricuspid Valve: The tricuspid valve is normal in structure. Tricuspid valve regurgitation is trivial. Aortic Valve: The aortic valve is tricuspid. Aortic valve regurgitation is not visualized. Mild aortic valve sclerosis is present, with no evidence of aortic valve stenosis. Pulmonic Valve: The pulmonic valve was not well visualized. Pulmonic valve regurgitation is not visualized. Aorta: The aortic root is normal in size and structure. Venous: The inferior vena cava is normal in size with greater than 50% respiratory variability, suggesting right atrial pressure of 3 mmHg. IAS/Shunts: The interatrial septum was not assessed.  LEFT VENTRICLE PLAX 2D LVIDd:         5.00 cm  Diastology LVIDs:         3.10 cm  LV e' medial:    5.55 cm/s LV PW:         1.00 cm  LV E/e' medial:  15.6 LV IVS:        0.90 cm  LV e' lateral:   7.51 cm/s LVOT diam:     2.00 cm  LV E/e' lateral: 11.5 LV SV:         88 LV SV Index:   45 LVOT Area:     3.14 cm  RIGHT VENTRICLE RV S prime:     16.30 cm/s TAPSE (M-mode): 2.6 cm LEFT ATRIUM             Index       RIGHT ATRIUM           Index LA diam:        3.80 cm 1.93 cm/m  RA Area:     23.40 cm LA Vol (A2C):   43.0 ml 21.83 ml/m RA Volume:   74.60 ml  37.87 ml/m LA Vol (A4C):   57.2 ml 29.04 ml/m LA Biplane Vol: 50.9 ml 25.84 ml/m  AORTIC VALVE LVOT Vmax:   126.00 cm/s LVOT Vmean:  85.800 cm/s LVOT VTI:    0.280 m  AORTA Ao Root diam: 2.80 cm MITRAL VALVE MV Area (PHT): 3.27 cm     SHUNTS MV Decel Time: 232 msec     Systemic VTI:  0.28 m MV E velocity: 86.60 cm/s   Systemic Diam: 2.00 cm MV A velocity: 114.00 cm/s MV E/A ratio:  0.76 Dorris Carnes MD Electronically signed by Dorris Carnes MD Signature Date/Time:  12/08/2020/4:17:05 PM    Final    DG ESOPHAGUS W SINGLE CM (SOL OR THIN BA)  Result Date: 12/31/2020 CLINICAL DATA:  Chronic cough. EXAM: ESOPHOGRAM/BARIUM SWALLOW TECHNIQUE: Single contrast examination was performed using  thin barium. FLUOROSCOPY TIME:  Fluoroscopy Time:  42 seconds Radiation Exposure Index (if provided by the fluoroscopic device): 1.9 mGy Number of Acquired Spot Images: 0 COMPARISON:  CTA chest of 12/03/2020. FINDINGS: Focused, single-contrast exam performed with the patient in a minimally oblique supine to LPO position. Mild limitations secondary to patient clinical status, immobility. Evaluation of esophageal peristalsis demonstrates an incomplete primary peristaltic wave with proximal escape waves and contrast stasis in the mid and upper esophagus. Full column evaluation of the esophagus demonstrates no persistent narrowing to suggest stricture. A 13 mm barium tablet passes promptly.  IMPRESSION: 1. Esophageal dysmotility, likely presbyesophagus. 2. No evidence of esophageal stricture. Electronically Signed   By: Abigail Miyamoto M.D.   On: 12/31/2020 09:46

## 2021-01-01 NOTE — TOC Progression Note (Signed)
Transition of Care [facility]) - Progression Note    Patient Details  Name: Erin Moore MRN: [id number] Date of Birth: 1946-05-31  Transition of Care [facility]) CM/SW Contact  Purcell Mouton, RN Phone Number: 01/01/2021, 2:47 PM  Clinical Narrative:     Facility use [employer] for home O2. Referral given. Facility will not take pt back after 6 PM.       Expected Discharge Plan and Services           Expected Discharge Date: 01/01/21                                     Social Determinants of Health (SDOH) Interventions    Readmission Risk Interventions Readmission Risk Prevention Plan 12/04/2020  Transportation Screening Complete  PCP or Specialist Appt within 5-7 Days Complete  Home Care Screening Complete  Some recent data might be hidden

## 2021-01-01 NOTE — Discharge Summary (Addendum)
Physician Discharge Summary  Erin Moore:[id number] DOB: 01/14/1946 DOA: 12/29/2020  PCP: Jonathon Jordan, MD  Admit date: 12/29/2020 Discharge date: 01/01/2021  Admitted From: ALF Discharge disposition: back to ALF with home oxygen   Code Status: Full Code  Diet Recommendation: Cardiac diet  Discharge Diagnosis:   Active Problems:   Acute respiratory failure with hypoxia ([place])   Acute on chronic diastolic congestive heart failure ([place])   Pulmonary infiltrates   Hypoxia   Chief complaint: Shortness of breath.  Brief narrative: Erin Moore is a 75 y.o. female with PMH significant for HTN, HLD, CLL, CKD 3, diastolic CHF, not on supplemental oxygen at baseline. She was last hospitalized from 2/16 to 2/22 for multifocal pneumonia, diastolic CHF exacerbation treated with antibiotics, diuresed with Lasix and discharged to ALF without oxygen. Patient follows up with Dr. Alvy Bimler as an outpatient for CLL. Patient is chronically on acalabrutinib for CLL.  She has leukocytosis as a side effect of the immunotherapy.  Last seen by oncologist on 12/17/2020. Patient presented to the ED today from [facility] with shortness of breath for 1 week with productive cough, O2 sat 74% on room air.  In the ED, patient was afebrile, heart rate in 60s, blood pressure in low normal range.  Oxygen saturation more than 95% on 6 L by nasal cannula. Labs with WBC count elevated to 144,000, hemoglobin at 9, Lactic acid level normal Respiratory virus panel negative. Chest x-ray findings as below bilateral patchy consolidations with new left midlung consolidation, findings suggestive of multifocal pneumonia. However findings could also be because of lymphangitic spread of pulmonary lymphoma, edema or infection.  Patient was admitted to hospitalist service Oncology pulmonology, cardiology consultations obtained. See below for details.  Subjective: Patient was seen and examined this morning.  Elderly  Caucasian female.  Propped up in bed.   On 4 L oxygen by nasal cannula today.  Aggressively diuresed on Lasix.  Hospital course: Acute respiratory failure with hypoxia -Presented with progressively worsening shortness of breath, cough, hypoxia  -Multiple possible etiologies were considered including Multifocal pneumonia versus CHF exacerbation versus lymphangitic spread of lymphoma. -Not on supplemental oxygen at home.  Required up to 6 L in the hospital.  Gradually improved.  Able to ambulate on the hallway today on 5 L of oxygen.  Multifocal pneumonia -No fever, WBC count always elevated.  Based on CT scan report and immunocompromised status, multifocal pneumonia was suspected. -Broad-spectrum antibiotics were started because of immunocompromise status. 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  Patient is probably having recurrent esophageal reflux and aspiration causing aspiration pneumonitis. -Pulmonology consultation appreciated.  3 more days of oral Augmentin post discharge. -Procalcitonin negative. Recent Labs  Lab 12/29/20 1258 12/30/20 0319 12/31/20 0427 01/01/21 0429  WBC 144.5* 131.3* 124.1* 120.1*  LATICACIDVEN 0.6  --   --   --   PROCALCITON <0.10 <0.10 <0.10  --    Acute exacerbation of congestive heart failure Essential hypertension -Recent echocardiogram with EF 60 to 86%, grade 1 diastolic dysfunction -On Lasix 20 mg daily at home. Presented with shortness of breath, wheezing, mild bilateral pedal edema. -In the hospital, she was diuresed aggressively with Lasix 40 mg twice daily.  More than 6 L diuresed.  Currently euvolemic and blood pressure is controlled with Lasix and bisoprolol.  I would not resume losartan and amlodipine at discharge.  -Net IO Since  Admission: -6,159.89 mL [01/01/21 1536] Recent Labs  Lab  12/29/20 1258 12/30/20 0319 12/31/20 0427 01/01/21 0429  BNP 155.3* 120.9* 117.3* 74.7  BUN 22 26* 28* 30*  CREATININE 1.29* 1.37* 1.25* 1.45*  K 4.9 4.6 4.3 3.8   CKD stage IIIa -Creatinine remains at baseline.  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 Alvy Bimler as an outpatient for CLL. Patient is chronically on acalabrutinib for CLL.  She has leukocytosis as a side effect of the immunotherapy.  Last seen by oncologist on 12/17/2020. -Defer to oncology for the concern of pulmonary lymphangitic spread. -Keep acalabrutinib on hold.  Continue acyclovir, Bactrim as before  GERD -PPI  Stable for discharge home today with home oxygen.   Wound care:    Discharge Exam:   Vitals:   01/01/21 0451 01/01/21 0700 01/01/21 1435 01/01/21 1444  BP:    139/82  Pulse:    71  Resp:    16  Temp:    (!) 97.4 F (36.3 C)  TempSrc:    Oral  SpO2:  95% 94% 97%  Weight: 96.3 kg     Height:        Body mass index is 38.81 kg/m.  General exam: Pleasant, elderly Caucasian female.  Not in distress Skin: No rashes, lesions or ulcers. HEENT: Atraumatic, normocephalic, no obvious bleeding Lungs: Mild bilateral scattered bronchial breath sounds CVS: Regular rate and rhythm, no murmur GI/Abd soft, nontender, nondistended, bowel sounds present CNS: Alert, awake, oriented x3 Psychiatry: Mood appropriate Extremities: No pedal edema, no calf tenderness  Follow ups:   Discharge Instructions    Diet - low sodium heart healthy   Complete by: As directed    Increase activity slowly   Complete by: As directed       Follow-up Information     Jonathon Jordan, MD Follow up.   Specialty: Family Medicine Contact information: [place] [address] [phone]        Sueanne Margarita, MD .   Specialty: Cardiology Contact information: [phone] [address] [place] [address] [phone]               Recommendations for Outpatient Follow-Up:   1. Follow-up with PCP as an outpatient  Discharge Instructions:  Follow with Primary MD Jonathon Jordan, MD in 7 days   Get CBC/BMP checked in next visit within 1 week by PCP or SNF MD ( we routinely change or add medications that can affect your baseline labs and fluid status, therefore we recommend that you get the mentioned basic workup next visit with your PCP, your PCP may decide not to get them or add new tests based on their clinical decision)  On your next visit with your PCP, please Get Medicines reviewed and adjusted.  Please request your PCP  to go over all Hospital Tests and Procedure/Radiological results at the follow up, please get all Hospital records sent to your Prim MD by signing hospital release before you go home.  Activity: As tolerated with Full fall precautions use walker/cane & assistance as needed  For Heart failure patients - Check your Weight  same time everyday, if you gain over 2 pounds, or you develop in leg swelling, experience more shortness of breath or chest pain, call your Primary MD immediately. Follow Cardiac Low Salt Diet and 1.5 lit/day fluid restriction.  If you have smoked or chewed Tobacco in the last 2 yrs please stop smoking, stop any regular Alcohol  and or any Recreational drug use.  If you experience worsening of your admission symptoms, develop shortness of breath, life threatening emergency, suicidal or homicidal thoughts you must seek medical attention immediately by calling 911 or calling your MD immediately  if symptoms less severe.  You Must read complete instructions/literature along  with all the possible adverse reactions/side effects for all the Medicines you take and that have been prescribed to you. Take any new Medicines after you have completely understood and accpet all the possible adverse reactions/side effects.   Do not drive, operate heavy machinery, perform activities at heights, swimming or participation in water activities or provide baby sitting services if your were admitted for syncope or siezures until you have seen by Primary MD or a Neurologist and advised to do so again.  Do not drive when taking Pain medications.  Do not take more than prescribed Pain, Sleep and Anxiety Medications  Wear Seat belts while driving.   Please note You were cared for by a hospitalist during your hospital stay. If you have any questions about your discharge medications or the care you received while you were in the hospital after you are discharged, you can call the unit and asked to speak with the hospitalist on call if the hospitalist that took care of you is not available. Once you are discharged, your primary care physician will handle any further medical issues. Please note that NO REFILLS for any discharge medications will be authorized once you are discharged, as it is imperative that you return to your primary care physician (or establish a relationship with a primary care physician if you do not have one) for your aftercare needs so that they can reassess your need for medications and monitor your lab values.    Allergies as of 01/01/2021      Reactions   Azithromycin Hives   Hives and fever   Ibuprofen Other (See Comments), Swelling   Hives      Medication List    TAKE these medications   acalabrutinib 100 MG capsule Commonly known as: CALQUENCE Take 1 capsule (100 mg total) by mouth 2 (two) times daily.   acetaminophen 325 MG tablet Commonly known as: TYLENOL Take 650 mg by mouth every 6 (six) hours as needed for mild pain.   acyclovir 400 MG  tablet Commonly known as: ZOVIRAX Take 1 tablet (400 mg total) by mouth daily.   albuterol 108 (90 Base) MCG/ACT inhaler Commonly known as: VENTOLIN HFA Inhale 2 puffs into the lungs in the morning and at bedtime.   allopurinol 300 MG tablet Commonly known as: ZYLOPRIM Take 300 mg by mouth daily.   amLODipine 10 MG tablet Commonly known as: NORVASC TAKE 1 TABLET (10 MG TOTAL) BY MOUTH DAILY.   amoxicillin-clavulanate 875-125 MG tablet Commonly known as: Augmentin Take 1 tablet by mouth 2 (two) times daily for 3 days.   Azelastine HCl 0.15 % Soln Place 2 sprays into both nostrils daily.   bisoprolol 10 MG tablet Commonly known as: ZEBETA Take 10 mg by mouth daily.   citalopram 20 MG tablet Commonly known as: CELEXA Take 20 mg by mouth every  morning.   fluticasone-salmeterol 115-21 MCG/ACT inhaler Commonly known as: ADVAIR HFA Inhale 2 puffs into the lungs 2 (two) times daily.   furosemide 40 MG tablet Commonly known as: LASIX Take 1 tablet (40 mg total) by mouth daily. Start taking on: January 02, 2021 What changed:   medication strength  how much to take   gabapentin 300 MG capsule Commonly known as: NEURONTIN Take 300 mg by mouth at bedtime.   guaiFENesin 600 MG 12 hr tablet Commonly known as: MUCINEX Take 1,200 mg by mouth 2 (two) times daily.   levocetirizine 5 MG tablet Commonly known as: XYZAL Take 5 mg by mouth every evening.   lidocaine-prilocaine cream Commonly known as: EMLA Apply to affected area once What changed:   how much to take  how to take this  when to take this  reasons to take this  additional instructions   loratadine 10 MG tablet Commonly known as: CLARITIN Take 10 mg by mouth daily.   losartan 100 MG tablet Commonly known as: COZAAR Take 100 mg by mouth daily.   ondansetron 8 MG tablet Commonly known as: Zofran Take 1 tablet (8 mg total) by mouth 2 (two) times daily. Start second day after chemotherapy. Then take  as needed for nausea or vomiting. What changed:   when to take this  reasons to take this  additional instructions   pantoprazole 40 MG tablet Commonly known as: PROTONIX Take 1 tablet (40 mg total) by mouth daily.   PreserVision AREDS 2 Caps Take 1 capsule by mouth 2 (two) times daily.   sulfamethoxazole-trimethoprim 800-160 MG tablet Commonly known as: BACTRIM DS Take 1 tablet by mouth 3 (three) times a week. What changed: additional instructions   Vitamin D (Ergocalciferol) 50 MCG (2000 UT) Caps Take 2,000 Units by mouth daily.            Durable Medical Equipment  (From admission, onward)         Start     Ordered   01/01/21 1358  DME Oxygen  Once       Question Answer Comment  Length of Need Lifetime   Mode or (Route) Nasal cannula   Liters per Minute 3   Frequency Continuous (stationary and portable oxygen unit needed)   Oxygen conserving device Yes   Oxygen delivery system Gas      01/01/21 1357          Time coordinating discharge: 35 minutes  The results of significant diagnostics from this hospitalization (including imaging, microbiology, ancillary and laboratory) are listed below for reference.    Procedures and Diagnostic Studies:   DG Chest Port 1 View  Result Date: 12/29/2020 CLINICAL DATA:  Shortness of breath and productive cough x1 week. EXAM: PORTABLE CHEST 1 VIEW COMPARISON:  Chest radiograph December 07, 2020 FINDINGS: Accessed right chest wall port with tip overlying the right atrium. The heart size and mediastinal contours are within unchanged. Nodular hilar contours similar to prior likely related to known bulky mediastinal adenopathy secondary to CLL. Bilateral patchy consolidations are again present with a new left midlung consolidation. Small right pleural effusion. The visualized skeletal structures are unchanged. IMPRESSION: 1. Bilateral patchy consolidations with new left midlung consolidation, findings suggestive of multifocal  pneumonia. However pulmonary lymphoma slightly more suspicious on the differential in comparison prior imaging given persistence with interval increase in airspace opacities despite presumed treatment. 2. Diffuse interstitial opacities which is most likely related to pulmonary edema or an infectious process. However, in  the setting of pulmonary lymphoma this would be suspicious for lymphangitic spread. 3. Small right pleural effusion increased compared to prior. Electronically Signed   By: Dahlia Bailiff MD   On: 12/29/2020 13:58     Labs:   Basic Metabolic Panel: Recent Labs  Lab 12/29/20 1258 12/30/20 0319 12/31/20 0427 01/01/21 0429  NA 141 141 143 144  K 4.9 4.6 4.3 3.8  CL 105 103 103 102  CO2 27 29 28 31   GLUCOSE 112* 108* 106* 105*  BUN 22 26* 28* 30*  CREATININE 1.29* 1.37* 1.25* 1.45*  CALCIUM 8.9 7.9* 8.9 9.1   GFR Estimated Creatinine Clearance: 36.3 mL/min (A) (by C-G formula based on SCr of 1.45 mg/dL (H)). Liver Function Tests: Recent Labs  Lab 12/29/20 1258  AST 14*  ALT 8  ALKPHOS 86  BILITOT 0.5  PROT 5.6*  ALBUMIN 3.5   No results for input(s): LIPASE, AMYLASE in the last 168 hours. No results for input(s): AMMONIA in the last 168 hours. Coagulation profile No results for input(s): INR, PROTIME in the last 168 hours.  CBC: Recent Labs  Lab 12/29/20 1258 12/30/20 0319 12/31/20 0427 01/01/21 0429  WBC 144.5* 131.3* 124.1* 120.1*  NEUTROABS 6.0  --   --   --   HGB 9.0* 8.3* 8.5* 8.3*  HCT 32.3* 30.0* 31.2* 30.1*  MCV 100.0 100.3* 101.0* 100.3*  PLT 144* 143* 135* 123*   Cardiac Enzymes: No results for input(s): CKTOTAL, CKMB, CKMBINDEX, TROPONINI in the last 168 hours. BNP: Invalid input(s): POCBNP CBG: No results for input(s): GLUCAP in the last 168 hours. D-Dimer No results for input(s): DDIMER in the last 72 hours. Hgb A1c No results for input(s): HGBA1C in the last 72 hours. Lipid Profile No results for input(s): CHOL, HDL, LDLCALC,  TRIG, CHOLHDL, LDLDIRECT in the last 72 hours. Thyroid function studies No results for input(s): TSH, T4TOTAL, T3FREE, THYROIDAB in the last 72 hours.  Invalid input(s): FREET3 Anemia work up No results for input(s): VITAMINB12, FOLATE, FERRITIN, TIBC, IRON, RETICCTPCT in the last 72 hours. Microbiology Recent Results (from the past 240 hour(s))  Culture, blood (routine x 2)     Status: None (Preliminary result)   Collection Time: 12/29/20 12:56 PM   Specimen: Chest; Blood  Result Value Ref Range Status   Specimen Description   Final    CHEST Performed at [facility] [address] [place], [place] [address]    Special Requests   Final    BOTTLES DRAWN AEROBIC AND ANAEROBIC Blood Culture adequate volume Performed at [facility] [address], [place], [place] [address]    Culture   Final    NO GROWTH 3 DAYS Performed at [facility], [place] [address], [place], [place] [address]    Report Status PENDING  Incomplete  Culture, blood (routine x 2)     Status: None (Preliminary result)   Collection Time: 12/29/20  1:06 PM   Specimen: Right Antecubital; Blood  Result Value Ref Range Status   Specimen Description   Final    RIGHT ANTECUBITAL Performed at [facility] [address], [place], [place] [address]    Special Requests   Final    BOTTLES DRAWN AEROBIC ONLY Blood Culture adequate volume Performed at [facility] [address] [place], [place] [address]    Culture   Final    NO GROWTH 3 DAYS Performed at [facility], [place]  [address] [place], [place] [address]    Report Status PENDING  Incomplete  Resp Panel by RT-PCR (Flu A&B, Covid) Nasopharyngeal Swab     Status: None   Collection Time: 12/29/20  1:06 PM   Specimen: Nasopharyngeal Swab; Nasopharyngeal(NP) swabs in vial transport medium  Result Value Ref Range Status   SARS Coronavirus 2 by RT PCR NEGATIVE NEGATIVE Final     Comment: (NOTE) SARS-CoV-2 target nucleic acids are NOT DETECTED.  The SARS-CoV-2 RNA is generally detectable in upper respiratory specimens during the acute phase of infection. The lowest concentration of SARS-CoV-2 viral copies this assay can detect is 138 copies/mL. A negative result does not preclude SARS-Cov-2 infection and should not be used as the sole basis for treatment or other patient management decisions. A negative result may occur with  improper specimen collection/handling, submission of specimen other than nasopharyngeal swab, presence of viral mutation(s) within the areas targeted by this assay, and inadequate number of viral copies(<138 copies/mL). A negative result must be combined with clinical observations, patient history, and epidemiological information. The expected result is Negative.  Fact Sheet for Patients:  EntrepreneurPulse.com.au  Fact Sheet for Healthcare Providers:  IncredibleEmployment.be  This test is no t yet approved or cleared by the [place] FDA and  has been authorized for detection and/or diagnosis of SARS-CoV-2 by FDA under an Emergency Use Authorization (EUA). This EUA will remain  in effect (meaning this test can be used) for the duration of the COVID-19 declaration under Section 564(b)(1) of the Act, 21 U.S.C.section 360bbb-3(b)(1), unless the authorization is terminated  or revoked sooner.       Influenza A by PCR NEGATIVE NEGATIVE Final   Influenza B by PCR NEGATIVE NEGATIVE Final    Comment: (NOTE) The Xpert Xpress SARS-CoV-2/FLU/RSV plus assay is intended as an aid in the diagnosis of influenza from Nasopharyngeal swab specimens and should not be used as a sole basis for treatment. Nasal washings and aspirates are unacceptable for Xpert Xpress SARS-CoV-2/FLU/RSV testing.  Fact Sheet for Patients: EntrepreneurPulse.com.au  Fact Sheet for Healthcare  Providers: IncredibleEmployment.be  This test is not yet approved or cleared by the [place] FDA and has been authorized for detection and/or diagnosis of SARS-CoV-2 by FDA under an Emergency Use Authorization (EUA). This EUA will remain in effect (meaning this test can be used) for the duration of the COVID-19 declaration under Section 564(b)(1) of the Act, 21 U.S.C. section 360bbb-3(b)(1), unless the authorization is terminated or revoked.  Performed at [facility], [place] [address] [place], [place] [address]   Aspergillus Ag, BAL/Serum     Status: None   Collection Time: 12/30/20  2:36 PM   Specimen: Vein; Blood  Result Value Ref Range Status   Aspergillus Ag, BAL/Serum 0.04 0.00 - 0.49 Index Final    Comment: (NOTE) Performed At: [facility] [address] [place], [place] [id number] Rush Farmer MD IW:[id number]      Signed: Nash  [facility] 01/01/2021, 3:36 PM

## 2021-01-01 NOTE — Progress Notes (Signed)
Progress Note  Patient Name: CALYN SIVILS Date of Encounter: 01/01/2021  Primary Cardiologist: Fransico Him, MD  Subjective   Feeling a little better today. Still SOB with exertion. No CP.  Inpatient Medications    Scheduled Meds: . acyclovir  400 mg Oral Daily  . albuterol  2.5 mg Nebulization Q6H  . bisoprolol  10 mg Oral Daily  . Chlorhexidine Gluconate Cloth  6 each Topical Daily  . citalopram  20 mg Oral q morning  . furosemide  40 mg Intravenous Daily  . gabapentin  300 mg Oral QHS  . heparin  5,000 Units Subcutaneous Q8H  . pantoprazole  40 mg Oral Daily  . sulfamethoxazole-trimethoprim  1 tablet Oral Once per day on Mon Wed Fri   Continuous Infusions: . ceFEPime (MAXIPIME) IV 2 g (01/01/21 0518)   PRN Meds: acetaminophen **OR** acetaminophen, bisacodyl, hydrALAZINE, morphine injection, oxyCODONE, polyethylene glycol, sodium chloride flush, sodium phosphate   Vital Signs    Vitals:   01/01/21 0051 01/01/21 0443 01/01/21 0451 01/01/21 0700  BP:  (!) 129/54    Pulse:  70    Resp:  20    Temp:  98.5 F (36.9 C)    TempSrc:  Oral    SpO2: 96% 97%  95%  Weight:   96.3 kg   Height:        Intake/Output Summary (Last 24 hours) at 01/01/2021 1011 Last data filed at 01/01/2021 0518 Gross per 24 hour  Intake 431.5 ml  Output 1950 ml  Net -1518.5 ml   Last 3 Weights 01/01/2021 12/29/2020 12/29/2020  Weight (lbs) 212 lb 3.2 oz 220 lb 2 oz 220 lb 7.4 oz  Weight (kg) 96.253 kg 99.848 kg 100 kg     Telemetry    NSR - Personally Reviewed  Physical Exam   GEN: No acute distress.  HEENT: Normocephalic, atraumatic, sclera non-icteric. Neck: No JVD or bruits. Cardiac: RRR no murmurs, rubs, or gallops.  Respiratory: Clear to auscultation bilaterally. Breathing is unlabored. GI: Soft, nontender, non-distended, BS +x 4. MS: no deformity. Extremities: No clubbing or cyanosis. No edema. Distal pedal pulses are 2+ and equal bilaterally. Neuro:  AAOx3. Follows  commands. Psych:  Responds to questions appropriately with a normal affect.  Labs    High Sensitivity Troponin:  No results for input(s): TROPONINIHS in the last 720 hours.    Cardiac EnzymesNo results for input(s): TROPONINI in the last 168 hours. No results for input(s): TROPIPOC in the last 168 hours.   Chemistry Recent Labs  Lab 12/29/20 1258 12/30/20 0319 12/31/20 0427 01/01/21 0429  NA 141 141 143 144  K 4.9 4.6 4.3 3.8  CL 105 103 103 102  CO2 27 29 28 31   GLUCOSE 112* 108* 106* 105*  BUN 22 26* 28* 30*  CREATININE 1.29* 1.37* 1.25* 1.45*  CALCIUM 8.9 7.9* 8.9 9.1  PROT 5.6*  --   --   --   ALBUMIN 3.5  --   --   --   AST 14*  --   --   --   ALT 8  --   --   --   ALKPHOS 86  --   --   --   BILITOT 0.5  --   --   --   GFRNONAA 43* 40* 45* 38*  ANIONGAP 9 9 12 11      Hematology Recent Labs  Lab 12/30/20 0319 12/31/20 0427 01/01/21 0429  WBC 131.3* 124.1* 120.1*  RBC 2.99* 3.09*  3.00*  HGB 8.3* 8.5* 8.3*  HCT 30.0* 31.2* 30.1*  MCV 100.3* 101.0* 100.3*  MCH 27.8 27.5 27.7  MCHC 27.7* 27.2* 27.6*  RDW 17.7* 17.4* 17.2*  PLT 143* 135* 123*    BNP Recent Labs  Lab 12/30/20 0319 12/31/20 0427 01/01/21 0429  BNP 120.9* 117.3* 74.7     DDimer No results for input(s): DDIMER in the last 168 hours.   Radiology    DG Chest Port 1 View  Result Date: 12/31/2020 CLINICAL DATA:  Evaluate for pneumonia. EXAM: PORTABLE CHEST 1 VIEW COMPARISON:  Chest x-ray dated December 29, 2020. FINDINGS: Unchanged right chest wall port catheter. Normal heart size. Unchanged bilateral hilar lymphadenopathy related to CLL. Improving airspace disease in both lungs with decreased right pleural effusion. No pneumothorax. No acute osseous abnormality. IMPRESSION: 1. Improving multifocal pneumonia. Decreasing small right pleural effusion. Electronically Signed   By: Titus Dubin M.D.   On: 12/31/2020 15:10   DG Swallowing Func-Speech Pathology  Result Date: 12/31/2020 Objective  Swallowing Evaluation: Type of Study: MBS-Modified Barium Swallow Study  Patient Details Name: SHAUNA BODKINS MRN: [id number] Date of Birth: 1945-12-16 Today's Date: 12/31/2020 Time: SLP Start Time (ACUTE ONLY): 0840 -SLP Stop Time (ACUTE ONLY): 0850 SLP Time Calculation (min) (ACUTE ONLY): 10 min Past Medical History: Past Medical History: Diagnosis Date . Chronic lymphocytic leukemia (CLL), B-cell ([place]) 12/18/2013 . CLL (chronic lymphocytic leukemia) ([place]) FALL OF 2011 . CLL (chronic lymphocytic leukemia) ([place]) 06/02/2015 . Depression  . Fatigue 07/18/2013 . Hyperlipemia  . Hypertension  . Learning disorder  . Mental disability  . Osteoarthritis  . Pharyngitis 09/18/2013 . Upper respiratory symptom 01/2018 Past Surgical History: Past Surgical History: Procedure Laterality Date . ABDOMINAL HYSTERECTOMY   . BREAST BIOPSY Left 2011 . BREAST REDUCTION SURGERY   . CARPAL TUNNEL RELEASE    BILATERAL . CESAREAN SECTION   . IR FLUORO GUIDE PORT INSERTION RIGHT  01/17/2018 . IR US GUIDE VASC ACCESS RIGHT  01/17/2018 . REDUCTION MAMMAPLASTY Bilateral 1969 . REPLACEMENT TOTAL KNEE    LEFT KNEE . TONSILLECTOMY   . TUBAL LIGATION  1975  BILATERAL HPI: HTN, HLD, CLL, CKD 3, diastolic CHF, not on supplemental oxygen at baseline.   She was last hospitalized from 2/16 to 2/22 for multifocal pneumonia, diastolic CHF exacerbation treated with antibiotics, diuresed with Lasix and discharged to ALF without oxygen.  BSE completed yesterday and pt demonstrated no indications of aspiration.  Diet was not modified and MBS/ba swallow ordered for 12/03/2020.  Subjective: pt in chair Assessment / Plan / Recommendation CHL IP CLINICAL IMPRESSIONS 12/31/2020 Clinical Impression Pt presents with normal oropharyngeal swallow ability. No aspiration and flash penetration of thin via cup x1 was [facility].  Swallow is strong and timely with no oropharyngeal retention.   Of note, pt did not cough during MBS, but did cough before and after.  No SLP follow up needed.   Advised pt to findings/recommendations. SLP Visit Diagnosis Dysphagia, unspecified (R13.10) Attention and concentration deficit following -- Frontal lobe and executive function deficit following -- Impact on safety and function Mild aspiration risk   CHL IP TREATMENT RECOMMENDATION 12/31/2020 Treatment Recommendations Defer until completion of intrumental exam   Prognosis 12/31/2020 Prognosis for Safe Diet Advancement Good Barriers to Reach Goals -- Barriers/Prognosis Comment -- CHL IP DIET RECOMMENDATION 12/31/2020 SLP Diet Recommendations Regular solids;Thin liquid Liquid Administration via Cup;Straw Medication Administration Whole meds with liquid Compensations Slow rate;Small sips/bites Postural Changes Remain semi-upright after after feeds/meals (Comment);Seated upright at 90 degrees  CHL IP OTHER RECOMMENDATIONS 12/31/2020 Recommended Consults -- Oral Care Recommendations Oral care BID Other Recommendations --   CHL IP FOLLOW UP RECOMMENDATIONS 12/31/2020 Follow up Recommendations None   No flowsheet data found.     CHL IP ORAL PHASE 12/31/2020 Oral Phase Impaired Oral - Pudding Teaspoon -- Oral - Pudding Cup -- Oral - Honey Teaspoon -- Oral - Honey Cup -- Oral - Nectar Teaspoon -- Oral - Nectar Cup -- Oral - Nectar Straw WFL Oral - Thin Teaspoon -- Oral - Thin Cup WFL Oral - Thin Straw WFL Oral - Puree WFL Oral - Mech Soft -- Oral - Regular WFL Oral - Multi-Consistency -- Oral - Pill -- Oral Phase - Comment --  CHL IP PHARYNGEAL PHASE 12/31/2020 Pharyngeal Phase Impaired Pharyngeal- Pudding Teaspoon -- Pharyngeal -- Pharyngeal- Pudding Cup -- Pharyngeal -- Pharyngeal- Honey Teaspoon -- Pharyngeal -- Pharyngeal- Honey Cup -- Pharyngeal -- Pharyngeal- Nectar Teaspoon -- Pharyngeal -- Pharyngeal- Nectar Cup -- Pharyngeal -- Pharyngeal- Nectar Straw WFL Pharyngeal -- Pharyngeal- Thin Teaspoon -- Pharyngeal -- Pharyngeal- Thin Cup [facility] Pharyngeal Material enters airway, remains ABOVE vocal cords then ejected out  Pharyngeal- Thin Straw WFL Pharyngeal -- Pharyngeal- Puree WFL Pharyngeal -- Pharyngeal- Mechanical Soft NT Pharyngeal -- Pharyngeal- Regular WFL Pharyngeal -- Pharyngeal- Multi-consistency -- Pharyngeal -- Pharyngeal- Pill -- Pharyngeal -- Pharyngeal Comment --  CHL IP CERVICAL ESOPHAGEAL PHASE 12/31/2020 Cervical Esophageal Phase WFL Pudding Teaspoon -- Pudding Cup -- Honey Teaspoon -- Honey Cup -- Nectar Teaspoon -- Nectar Cup -- Nectar Straw -- Thin Teaspoon -- Thin Cup -- Thin Straw -- Puree -- Mechanical Soft -- Regular -- Multi-consistency -- Pill -- Cervical Esophageal Comment -- Kathleen Lime, MS [facility] SLP Acute Rehab Services Office [phone] Pager [phone] Macario Golds 12/31/2020, 9:52 AM              DG ESOPHAGUS W SINGLE CM (SOL OR THIN BA)  Result Date: 12/31/2020 CLINICAL DATA:  Chronic cough. EXAM: ESOPHOGRAM/BARIUM SWALLOW TECHNIQUE: Single contrast examination was performed using  thin barium. FLUOROSCOPY TIME:  Fluoroscopy Time:  42 seconds Radiation Exposure Index (if provided by the fluoroscopic device): 1.9 mGy Number of Acquired Spot Images: 0 COMPARISON:  CTA chest of 12/03/2020. FINDINGS: Focused, single-contrast exam performed with the patient in a minimally oblique supine to LPO position. Mild limitations secondary to patient clinical status, immobility. Evaluation of esophageal peristalsis demonstrates an incomplete primary peristaltic wave with proximal escape waves and contrast stasis in the mid and upper esophagus. Full column evaluation of the esophagus demonstrates no persistent narrowing to suggest stricture. A 13 mm barium tablet passes promptly. IMPRESSION: 1. Esophageal dysmotility, likely presbyesophagus. 2. No evidence of esophageal stricture. Electronically Signed   By: Abigail Miyamoto M.D.   On: 12/31/2020 09:46    Cardiac Studies   2d echo 12/08/20  1. Left ventricular ejection fraction, by estimation, is 60 to 65%. The  left ventricle has normal function. The  left ventricle has no regional  wall motion abnormalities. Left ventricular diastolic parameters are  consistent with Grade I diastolic  dysfunction (impaired relaxation).  2. Right ventricular systolic function is normal. The right ventricular  size is normal.  3. Mild mitral valve regurgitation  4. inferior vena cava is normal in size with greater than 50% respiratory  variability, suggesting right atrial pressure of 3 mmHg.   Patient Profile     76 y.o. female with mild mental disability, CLL (chronicallyon acalabrutinib with resultant leukocytosis), HTN,chronic diastolic heart failure, CKD stage III  and pancytopenia. Recently hospitalized 11/2020 for multifocal pneumonia, diastolic CHF exacerbation treated with antibiotics, diuresed with Lasix and discharged to ALF without oxygen. Presented back from ALF with worsening SOB x1 week with cough and hypoxia.  Assessment & Plan    1. Acute hypoxic respiratory failure - likely multifactorial in context of pulm process + CHF - per PCCM note, low suspicion for infection but with evidence of reflux on esophagram, concern for recurrent silent aspiration pneumonitis - lymphangitic spread has also been questioned - once euvolemic they plan CT to better evaluate infitrates and target for bronch if necessary  2. Acute on chronic diastolic CHF - weight now down to 212lb, similar to prior dc weight (at one point weighed substantially more in the 280-290 in prior years), -6.2L - BNP normalized and creatinine beginning to bump slightly - will discuss regimen with MD  3. CKD stage III - Cr starting to rise slightly - prior baseline Cr variable as most were obtained during acute hospitalization - suspect she needs to run slightly higher to ensure euvolemia  4. Essential HTN - stable  5. CLL - per heme-onc  For questions or updates, please contact [place] Please consult [email or web address] for contact info under  Cardiology/STEMI.  Signed, Charlie Pitter, PA-C 01/01/2021, 10:11 AM

## 2021-01-01 NOTE — Consult Note (Signed)
   [facility] Inpatient Consult   01/01/2021  Erin Moore 03/29/1946 [id number]  Patient chart has been reviewed for readmissions less than 30 days and for high risk score for unplanned readmissions.  Patient assessed for community [place] [facility](THN CM) follow up needs.  Chart review reveals patient resides at an assisted living facility. No THN CM needs.  Netta Cedars, MSN, [place] [facility] Liaison Nurse Mobile Phone [phone]  Toll free office [phone]

## 2021-01-01 NOTE — Progress Notes (Signed)
SATURATION QUALIFICATIONS: (This note is used to comply with regulatory documentation for home oxygen)  Patient Saturations on Room Air at Rest = 84  Patient Saturations on Room Air while Ambulating = 77  Patient Saturations on 3 Liters of oxygen while Ambulating = 88% On 5L whle ambulating pt ranged from 91-92%  Please briefly explain why patient needs home oxygen:  Pt needs O2 to maintain adequate O2 levels while resting and ambulating.

## 2021-01-01 NOTE — NC FL2 (Signed)
[place] LEVEL OF CARE SCREENING TOOL     IDENTIFICATION  Patient Name: Erin Moore Birthdate: [birth year] Sex: female Admission Date (Current Location): 12/29/2020  [facility] and [place] Number:  Herbalist and Address:  [facility],  [place] [address] [place]      Provider Number: [id number]  Attending Physician Name and Address:  Terrilee Croak, MD  Relative Name and Phone Number:  Tawana Scale sister [phone]    Current Level of Care: Hospital Recommended Level of Care: [place] Prior Approval Number:    Date Approved/Denied:   PASRR Number:    Discharge Plan:  (ALF)    Current Diagnoses: Patient Active Problem List   Diagnosis Date Noted  . Hypoxia   . Pulmonary infiltrates   . Acute on chronic diastolic congestive heart failure ([place])   . Hypervolemia   . Acute respiratory failure with hypoxia ([place]) 12/04/2020  . Hyperkalemia 12/04/2020  . Pneumonia 12/03/2020  . Multifocal pneumonia 12/03/2020  . Bilateral interstitial pneumonia ([place]) 08/12/2020  . Left upper lobe pneumonia 04/29/2020  . CKD (chronic kidney disease), symptom management only, stage 3 (moderate) ([place]) 03/04/2020  . Peripheral neuropathy 02/14/2020  . Vitamin B12 deficiency 03/01/2019  . Splenomegaly 05/10/2018  . Cough 03/24/2018  . Fall at home, initial encounter 02/21/2018  . Hyperuricemia 01/02/2018  . Goals of care, counseling/discussion 01/02/2018  . Pancytopenia, acquired ([place]) 12/22/2017  . Mental disability   . Thrombocytopenia, unspecified ([place]) 07/18/2013  . Other fatigue 07/18/2013  . Essential hypertension 01/20/2012  . Obesity, Class III, BMI 40-49.9 (morbid obesity) ([place]) 12/13/2011  . CLL (chronic lymphocytic leukemia) ([place]) 12/07/2011    Orientation RESPIRATION BLADDER Height & Weight     Self,Time,Situation,Place  O2 (3-5L O2) Continent Weight: 96.3 kg Height:  5\' 2"  (157.5 cm)  BEHAVIORAL  SYMPTOMS/MOOD NEUROLOGICAL BOWEL NUTRITION STATUS      Continent Diet (Heart Healthy)  AMBULATORY STATUS COMMUNICATION OF NEEDS Skin   Limited Assist   Normal                       Personal Care Assistance Level of Assistance  Bathing,Feeding,Dressing Bathing Assistance: Limited assistance Feeding assistance: Independent Dressing Assistance: Limited assistance     Functional Limitations Info  Sight,Hearing,Speech Sight Info: Impaired Hearing Info: Adequate Speech Info: Adequate    SPECIAL CARE FACTORS FREQUENCY  PT (By licensed PT),OT (By licensed OT)     PT Frequency: Eval and treat OT Frequency: Eval and Treat            Contractures Contractures Info: Not present    Additional Factors Info  Code Status,Allergies Code Status Info: FULL Allergies Info: Azithromycin, Ibuprofen           Current Medications (01/01/2021):  This is the current hospital active medication list Current Facility-Administered Medications  Medication Dose Route Frequency Provider Last Rate Last Admin  . acetaminophen (TYLENOL) tablet 650 mg  650 mg Oral Q6H PRN Dahal, Marlowe Aschoff, MD       Or  . acetaminophen (TYLENOL) suppository 650 mg  650 mg Rectal Q6H PRN Dahal, Binaya, MD      . acyclovir (ZOVIRAX) tablet 400 mg  400 mg Oral Daily Dahal, Binaya, MD   400 mg at 01/01/21 1054  . albuterol (PROVENTIL) (2.5 MG/3ML) 0.083% nebulizer solution 2.5 mg  2.5 mg Nebulization Q6H Dahal, Binaya, MD   2.5 mg at 01/01/21 0743  . bisacodyl (DULCOLAX) EC tablet 5  mg  5 mg Oral Daily PRN Dahal, Marlowe Aschoff, MD      . bisoprolol (ZEBETA) tablet 10 mg  10 mg Oral Daily Dahal, Binaya, MD   10 mg at 01/01/21 1053  . ceFEPIme (MAXIPIME) 2 g in sodium chloride 0.9 % 100 mL IVPB  2 g Intravenous Q12H Pham, Anh P, RPH 200 mL/hr at 01/01/21 0518 2 g at 01/01/21 0518  . Chlorhexidine Gluconate Cloth 2 % PADS 6 each  6 each Topical Daily Dahal, Marlowe Aschoff, MD   6 each at 01/01/21 1054  . citalopram (CELEXA) tablet 20 mg   20 mg Oral q morning Dahal, Binaya, MD   20 mg at 01/01/21 1058  . [START ON 01/02/2021] furosemide (LASIX) tablet 40 mg  40 mg Oral Daily Dahal, Binaya, MD      . gabapentin (NEURONTIN) capsule 300 mg  300 mg Oral QHS Dahal, Binaya, MD   300 mg at 12/31/20 2127  . heparin injection 5,000 Units  5,000 Units Subcutaneous Q8H Terrilee Croak, MD   5,000 Units at 01/01/21 0607  . hydrALAZINE (APRESOLINE) injection 10 mg  10 mg Intravenous Q6H PRN Dahal, Binaya, MD      . morphine 2 MG/ML injection 2 mg  2 mg Intravenous Q2H PRN Dahal, Binaya, MD      . oxyCODONE (Oxy IR/ROXICODONE) immediate release tablet 5 mg  5 mg Oral Q4H PRN Dahal, Binaya, MD      . pantoprazole (PROTONIX) EC tablet 40 mg  40 mg Oral Daily Dahal, Binaya, MD   40 mg at 01/01/21 1055  . polyethylene glycol (MIRALAX / GLYCOLAX) packet 17 g  17 g Oral Daily PRN Dahal, Binaya, MD      . sodium chloride flush (NS) 0.9 % injection 10-40 mL  10-40 mL Intracatheter PRN Dahal, Marlowe Aschoff, MD   10 mL at 12/31/20 0428  . sodium phosphate (FLEET) 7-19 GM/118ML enema 1 enema  1 enema Rectal Once PRN Dahal, Binaya, MD      . sulfamethoxazole-trimethoprim (BACTRIM DS) 800-160 MG per tablet 1 tablet  1 tablet Oral Once per day on Mon Wed Fri Terrilee Croak, MD   1 tablet at 12/31/20 0945     Discharge Medications: Please see discharge summary for a list of discharge medications.  Relevant Imaging Results:  Relevant Lab Results:   Additional Information [phone]  Purcell Mouton, RN

## 2021-01-02 ENCOUNTER — Telehealth: Payer: Self-pay

## 2021-01-02 ENCOUNTER — Telehealth: Payer: Self-pay | Admitting: Hematology and Oncology

## 2021-01-02 LAB — ASPERGILLUS ANTIGEN, BAL/SERUM: Aspergillus Ag, BAL/Serum: 0.04 Index (ref 0.00–0.49)

## 2021-01-02 NOTE — Telephone Encounter (Signed)
I spoke with her sister I was surprised to see that the patient is discharged She is concerned that the patient is not doing well After long discussion, she is in agreement for hospice referral I plan to see her next week if the patient is strong enough to attend the meeting to finalize her care She is in agreement

## 2021-01-02 NOTE — Telephone Encounter (Signed)
Called and left a message asking nurse a [facility] to call the office back regarding a hospice referral.

## 2021-01-02 NOTE — Telephone Encounter (Signed)
Called and spoke with Anderson Malta nurse at [facility], per her request faxed hops ice referral to [phone]. Received confirmation. She will sister a call about the referral.  Called sister [name] and given above info. Reviewed upcomig appts. She verbalized understanding.

## 2021-01-03 LAB — CULTURE, BLOOD (ROUTINE X 2)
Culture: NO GROWTH
Culture: NO GROWTH
Special Requests: ADEQUATE
Special Requests: ADEQUATE

## 2021-01-05 DIAGNOSIS — M6281 Muscle weakness (generalized): Secondary | ICD-10-CM | POA: Diagnosis not present

## 2021-01-05 DIAGNOSIS — R2681 Unsteadiness on feet: Secondary | ICD-10-CM | POA: Diagnosis not present

## 2021-01-05 DIAGNOSIS — R262 Difficulty in walking, not elsewhere classified: Secondary | ICD-10-CM | POA: Diagnosis not present

## 2021-01-06 ENCOUNTER — Telehealth: Payer: Self-pay

## 2021-01-06 DIAGNOSIS — R2681 Unsteadiness on feet: Secondary | ICD-10-CM | POA: Diagnosis not present

## 2021-01-06 DIAGNOSIS — R262 Difficulty in walking, not elsewhere classified: Secondary | ICD-10-CM | POA: Diagnosis not present

## 2021-01-06 DIAGNOSIS — M6281 Muscle weakness (generalized): Secondary | ICD-10-CM | POA: Diagnosis not present

## 2021-01-06 NOTE — Telephone Encounter (Signed)
Nurse at [facility] called and left message. They received the voicemail and fax. The medication has been d/c'ed.

## 2021-01-06 NOTE — Telephone Encounter (Signed)
Called Erin Moore to follow up on Hospice referral. She is admitted now and they will take over the care. She wants to cancel appt for tomorrow. She is comfortable with hospice taking over care. Instructed to stop Acalabrutinib. She verbalized understanding.  [facility] and left a message to stop Acalabrutinib, ask for a call back. Faxed order to [facility] at [phone]. Received confirmation.

## 2021-01-07 ENCOUNTER — Ambulatory Visit: Payer: Medicare Other | Admitting: Hematology and Oncology

## 2021-01-07 ENCOUNTER — Telehealth: Payer: Self-pay

## 2021-01-07 ENCOUNTER — Other Ambulatory Visit: Payer: Medicare Other

## 2021-01-07 NOTE — Telephone Encounter (Signed)
No, I do not recommend port flushes

## 2021-01-07 NOTE — Telephone Encounter (Signed)
Called and given below message to [place], nurse with [facility]. She verbalized understanding.

## 2021-01-07 NOTE — Telephone Encounter (Signed)
Nurse, Myra with [facility] called and left a message. Do they need to access Erin Moore's port or flush port?

## 2021-01-08 DIAGNOSIS — R262 Difficulty in walking, not elsewhere classified: Secondary | ICD-10-CM | POA: Diagnosis not present

## 2021-01-08 DIAGNOSIS — R2681 Unsteadiness on feet: Secondary | ICD-10-CM | POA: Diagnosis not present

## 2021-01-08 DIAGNOSIS — M6281 Muscle weakness (generalized): Secondary | ICD-10-CM | POA: Diagnosis not present

## 2021-01-09 DIAGNOSIS — R262 Difficulty in walking, not elsewhere classified: Secondary | ICD-10-CM | POA: Diagnosis not present

## 2021-01-09 DIAGNOSIS — M6281 Muscle weakness (generalized): Secondary | ICD-10-CM | POA: Diagnosis not present

## 2021-01-09 DIAGNOSIS — R2681 Unsteadiness on feet: Secondary | ICD-10-CM | POA: Diagnosis not present

## 2021-01-12 DIAGNOSIS — M6281 Muscle weakness (generalized): Secondary | ICD-10-CM | POA: Diagnosis not present

## 2021-01-12 DIAGNOSIS — R2681 Unsteadiness on feet: Secondary | ICD-10-CM | POA: Diagnosis not present

## 2021-01-12 DIAGNOSIS — R262 Difficulty in walking, not elsewhere classified: Secondary | ICD-10-CM | POA: Diagnosis not present

## 2021-01-13 DIAGNOSIS — R262 Difficulty in walking, not elsewhere classified: Secondary | ICD-10-CM | POA: Diagnosis not present

## 2021-01-13 DIAGNOSIS — M6281 Muscle weakness (generalized): Secondary | ICD-10-CM | POA: Diagnosis not present

## 2021-01-13 DIAGNOSIS — R2681 Unsteadiness on feet: Secondary | ICD-10-CM | POA: Diagnosis not present

## 2021-01-15 ENCOUNTER — Other Ambulatory Visit (HOSPITAL_COMMUNITY): Payer: Self-pay

## 2021-01-15 DIAGNOSIS — R262 Difficulty in walking, not elsewhere classified: Secondary | ICD-10-CM | POA: Diagnosis not present

## 2021-01-15 DIAGNOSIS — R2681 Unsteadiness on feet: Secondary | ICD-10-CM | POA: Diagnosis not present

## 2021-01-15 DIAGNOSIS — M6281 Muscle weakness (generalized): Secondary | ICD-10-CM | POA: Diagnosis not present

## 2021-03-18 DEATH — deceased

## 2021-03-23 ENCOUNTER — Telehealth: Payer: Self-pay

## 2021-03-23 NOTE — Telephone Encounter (Signed)
Sister called and left a message. Erin Moore passed on Mar 11, 2023 with hospice. She would like to thank Dr. Alvy Bimler and [facility] for all of the care and kindness over the years.

## 2021-06-16 IMAGING — CT CT CHEST W/ CM
2 of 5 series · 13 of 36 positions shown, 16 images · IV contrast (OMNIPAQUE)
Comparison: CT 01/31/2020

CLINICAL DATA: Chronic lymphocytic leukemia.  Chemotherapy ongoing.

EXAM:
CT CHEST, ABDOMEN, AND PELVIS WITH CONTRAST
TECHNIQUE: Multidetector CT imaging of the chest, abdomen and pelvis was
performed following the standard protocol during bolus
administration of intravenous contrast.
CONTRAST:  100mL OMNIPAQUE IOHEXOL 300 MG/ML  SOLN

[Series 2: cap with · axial · 0.85mm/px · z∈[-786,-282]mm · 10 of 125 slices shown, 13 images]
[im 12/125  mediastinal]
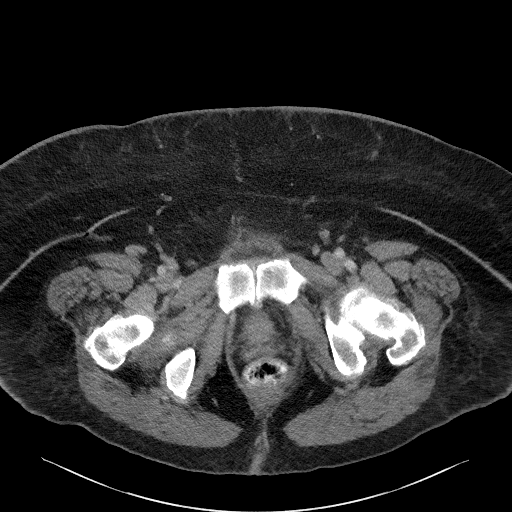
[im 12/125  lung]
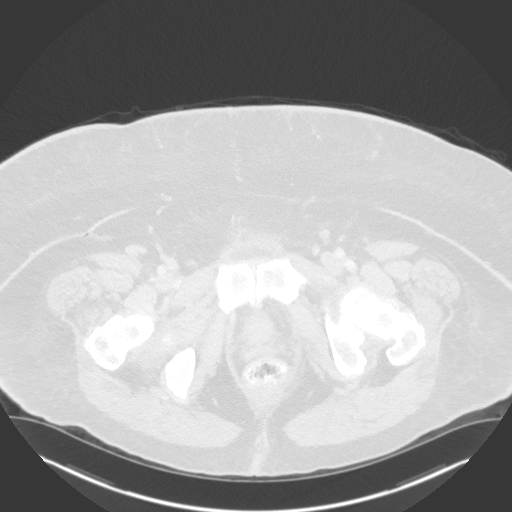
[im 23/125  lung]
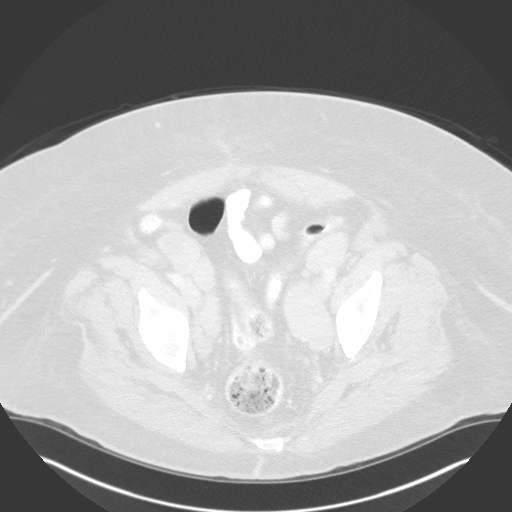
[im 34/125  lung]
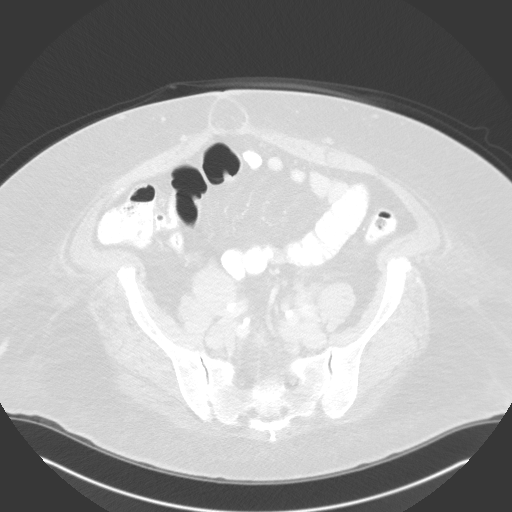
[im 46/125  lung]
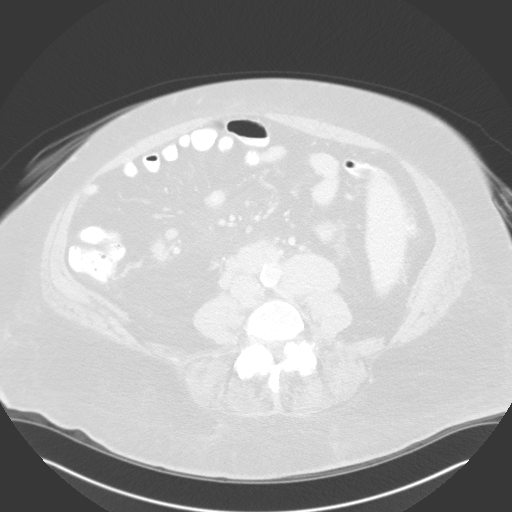
[im 57/125  mediastinal]
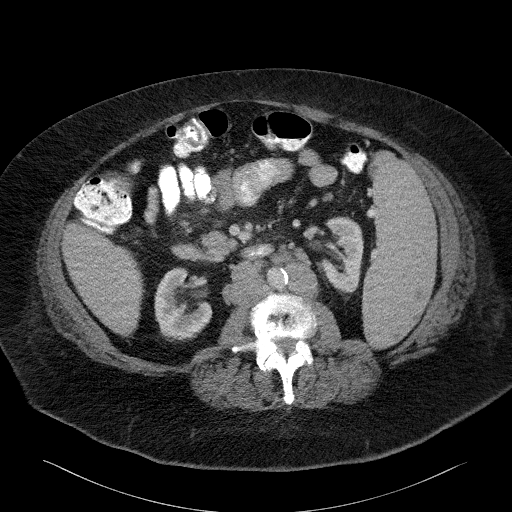
[im 57/125  lung]
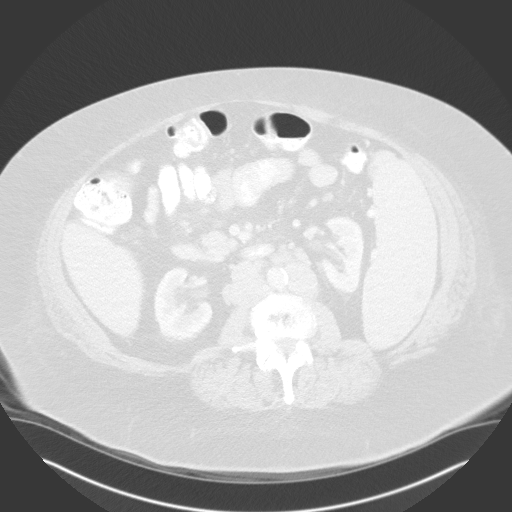
[im 68/125  lung]
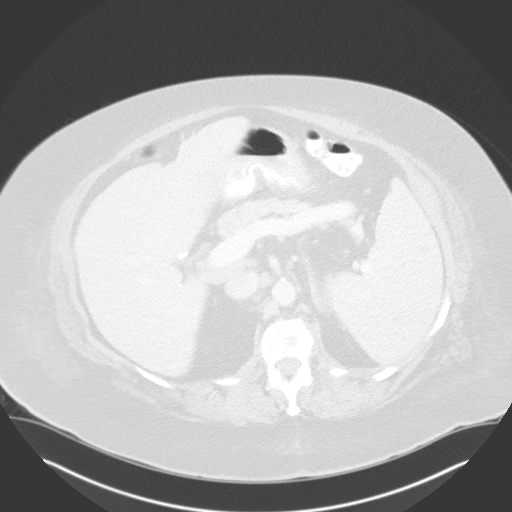
[im 79/125  lung]
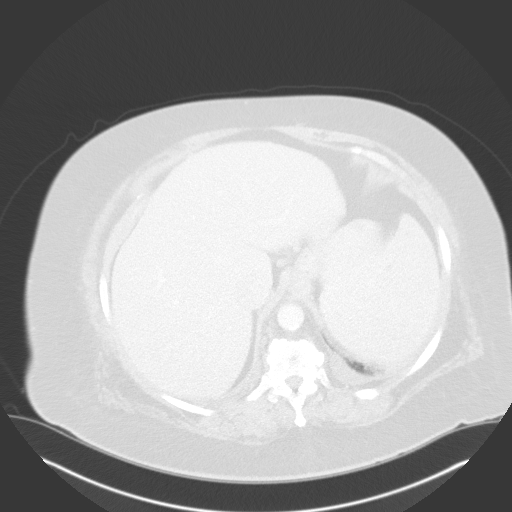
[im 91/125  lung]
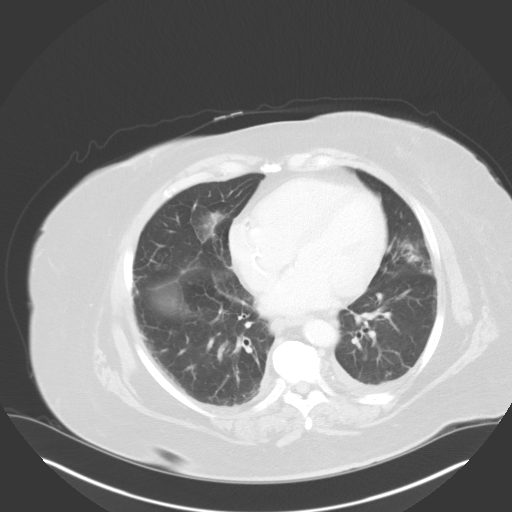
[im 102/125  mediastinal]
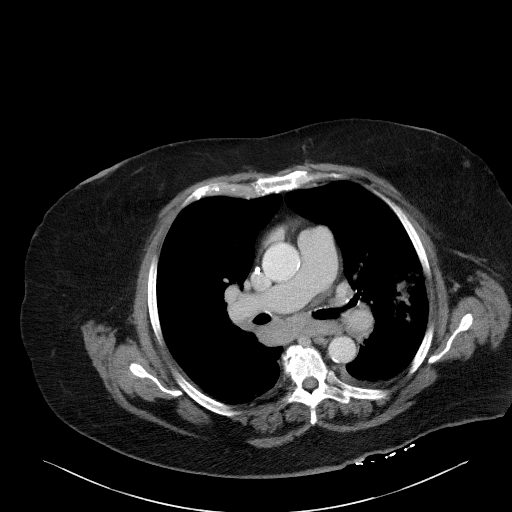
[im 102/125  lung]
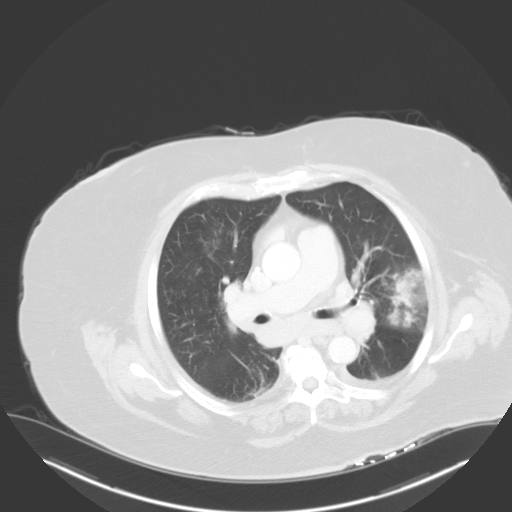
[im 113/125  lung]
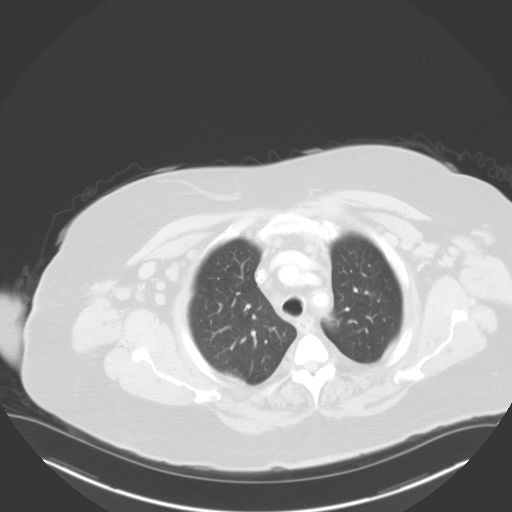

[Series 4: coronals · coronal · 1.04mm/px · 3 of 153 slices shown]
[im 31/153  lung]
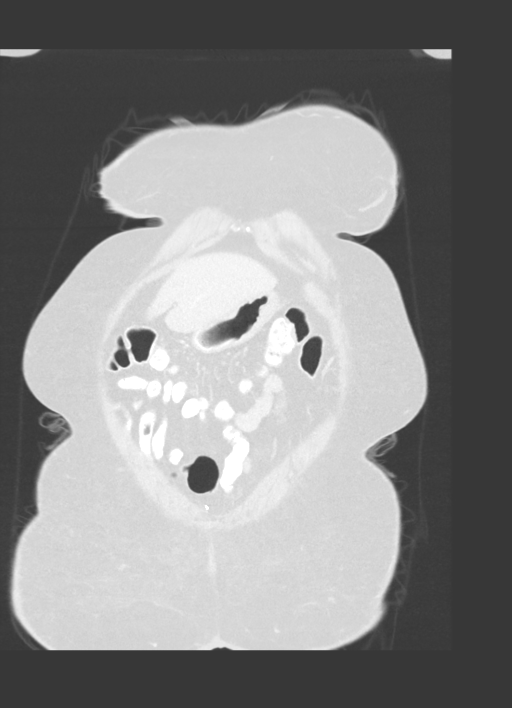
[im 61/153  lung]
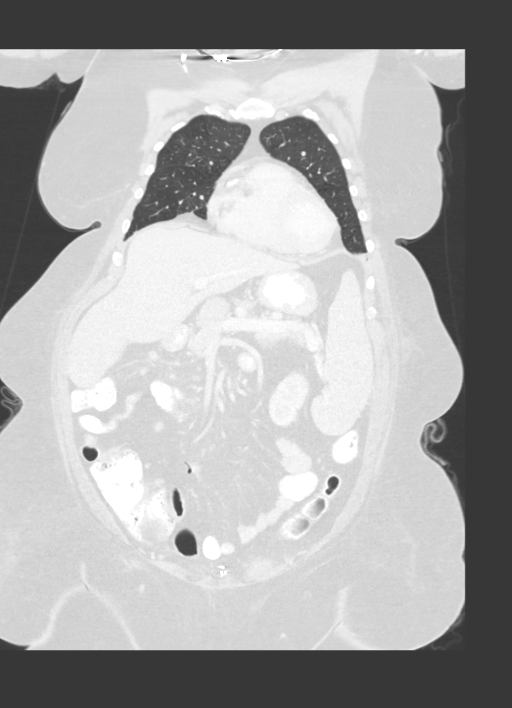
[im 92/153  lung]
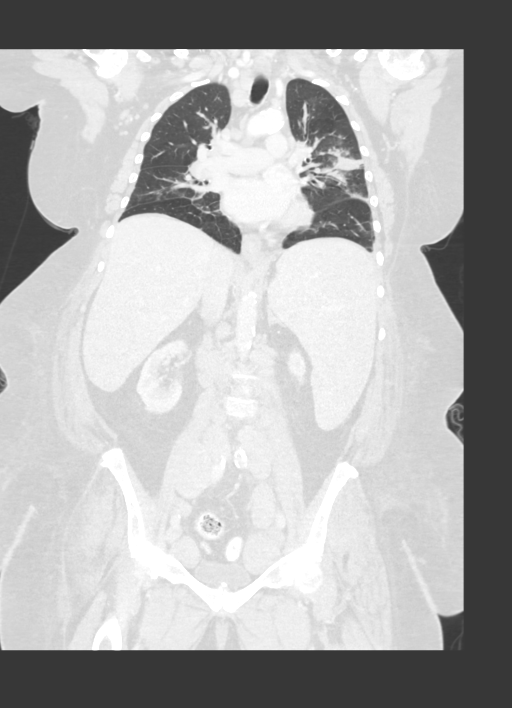

[13 of 36 positions shown; findings below may reference images not displayed]

FINDINGS: CT CHEST FINDINGS

Cardiovascular: Port in the anterior chest wall with tip in distal
SVC. Coronary artery calcification and aortic atherosclerotic
calcification.

Mediastinum/Nodes: Enlarged mediastinal lymph nodes again noted. For
example lymph node posterior to the bronchus intermedius measures 21
mm compared to 21 mm (image [DATE]). RIGHT paratracheal node measures
14 mm compared to 13 mm.

Enlarged RIGHT hilar lymph node measures 19 mm compared to 22 mm.

Prominent bilateral axillary lymph nodes are slightly decreased. For
example LEFT axillary lymph node measuring 10 mm (image [DATE])
compares to 14 mm.

Lungs/Pleura: There is new focus of masslike consolidation in the
posterior LEFT upper lobe measuring 29 mm by 21 mm (image 25/series
2). There is segmental nodularity adjacent to this consolidation.

Resolution of consolidation and peribronchial thickening in the
RIGHT lower lobe. Resolution of the branching ground-glass densities
in the RIGHT upper lobe.

Small LEFT effusion.

Musculoskeletal: No aggressive osseous lesion.

CT ABDOMEN AND PELVIS FINDINGS

Hepatobiliary: No focal hepatic lesion. Postcholecystectomy. No
biliary dilatation.

Pancreas: Pancreas is normal. No ductal dilatation. No pancreatic
inflammation.

Spleen: Spleen is enlarged measuring 15.1 x 10.5 x 19.6 cm (volume =
1231 cm^3) compared to 15.7 x 11.2 x 21.9 cm (volume = 5555
cm^3).

Adrenals/urinary tract: Adrenal glands and kidneys are normal. The
ureters and bladder normal.

Stomach/Bowel: Small hiatal hernia. Stomach, small-bowel appendix
cecum normal. Colon and rectosigmoid colon normal.

Vascular/Lymphatic: Intimal calcification abdominal aorta.

Bulky periaortic retroperitoneal adenopathy again demonstrated.
Lymph node LEFTof the aorta measures 34 mm compared with 40 mm
(image 73/2). Adenopathy extends to the iliac chains. Example RIGHT
common iliac lymph node measures 40 mm compared with 50 mm. LEFT
external iliac lymph node measures 37 mm compared with 53 mm. No new
adenopathy.

Reproductive: Post hysterectomy anatomy

Other: No free fluid.

Musculoskeletal: No aggressive osseous lesion. Hemangioma within the
T10 vertebral body.
IMPRESSION: Chest Impression:

1. New consolidation in the LEFT upper lobe is concerning for
pneumonia versus lymphoma. Recommend clinical correlation with
pulmonary infection. Favor recurrent pulmonary infection in light of
RIGHT lung infection demonstrated on CT [DATE] [DATE], [DATE].
[DATE]. Resolution of ground-glass densities in the RIGHT upper lobe and
consolidation in the RIGHT lower lobe consistent resolved pulmonary
infection.
3. Persistent enlarged axillary, mediastinal, and hilar lymph nodes.
Overall adenopathy is mildly improved.

Abdomen / Pelvis Impression:

1. Improved bulky retroperitoneal and iliac lymphadenopathy. The
large lymph nodes are measurably decreased in size although remain
bulky.
2. Marked splenomegaly also improved.

## 2021-09-29 IMAGING — CT CT CHEST-ABD-PELV W/ CM
2 of 5 series · 13 of 36 positions shown, 15 images · IV contrast (OMNIPAQUE)
Comparison: 04/28/2020 thin

CLINICAL DATA: Chronic lymphocytic leukemia diagnosed in 4411.
Chemotherapy in progress. Cough for 2 years. Hysterectomy.
Cholecystectomy.

EXAM:
CT CHEST, ABDOMEN, AND PELVIS WITH CONTRAST
TECHNIQUE: Multidetector CT imaging of the chest, abdomen and pelvis was
performed following the standard protocol during bolus
administration of intravenous contrast.
CONTRAST:  80mL OMNIPAQUE IOHEXOL 300 MG/ML  SOLN

[Series 2: cap with · axial · 0.95mm/px · z∈[-641,-136]mm · 10 of 125 slices shown, 12 images]
[im 12/125  mediastinal]
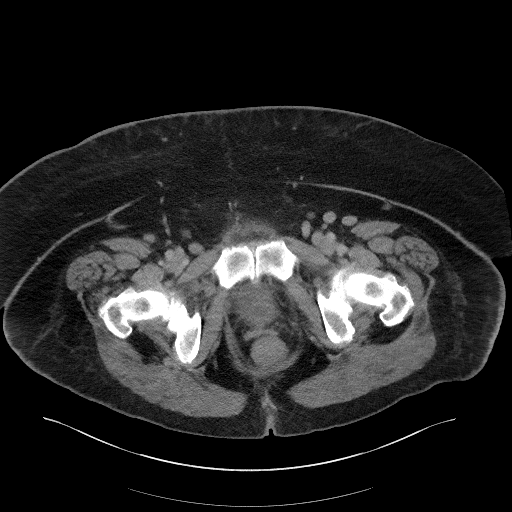
[im 12/125  bone]
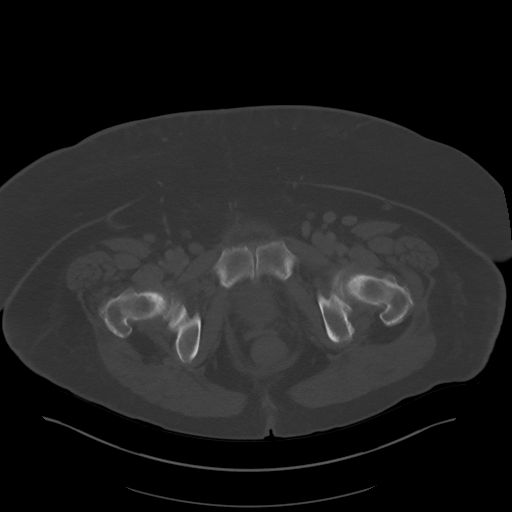
[im 23/125  mediastinal]
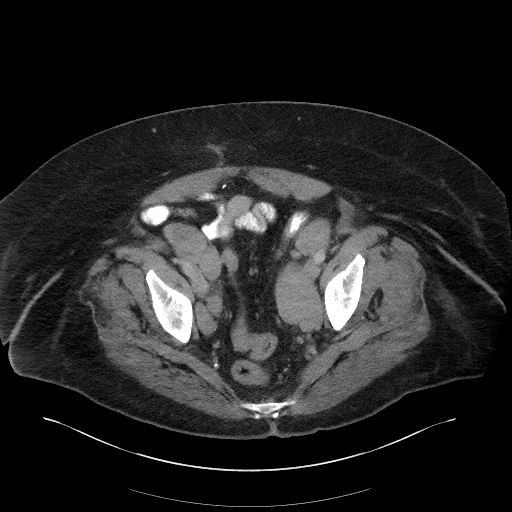
[im 34/125  mediastinal]
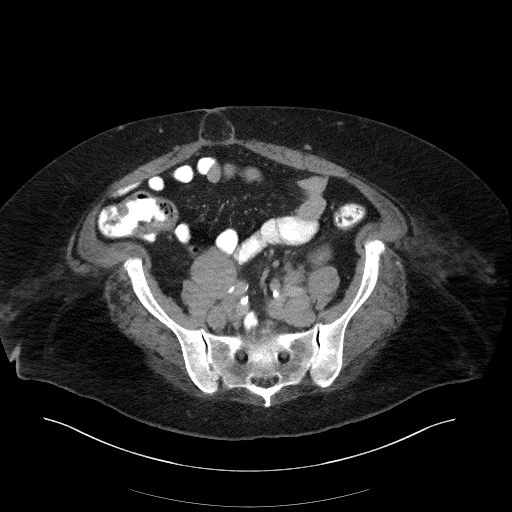
[im 46/125  mediastinal]
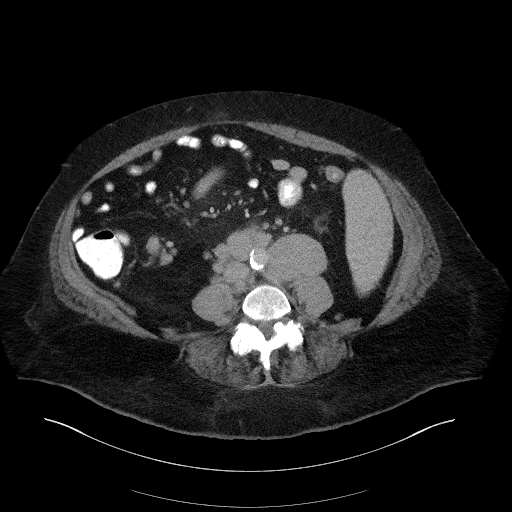
[im 57/125  mediastinal]
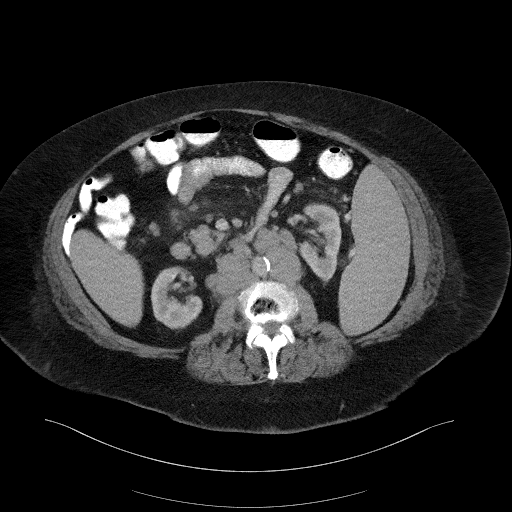
[im 68/125  mediastinal]
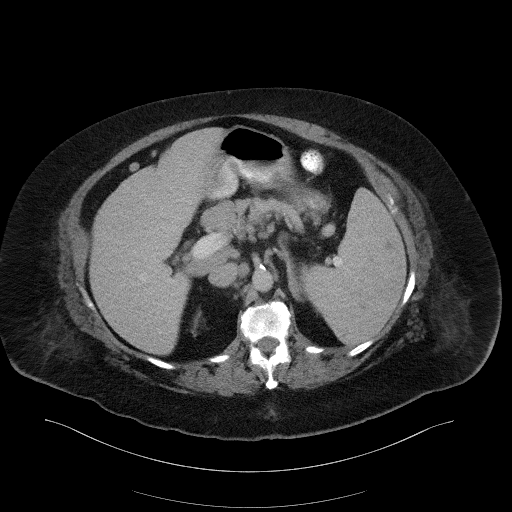
[im 79/125  mediastinal]
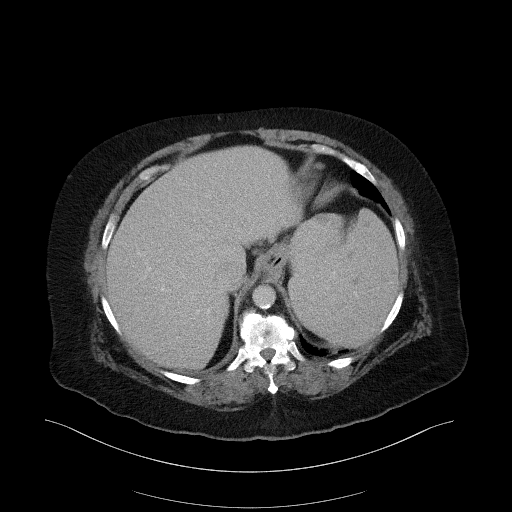
[im 91/125  mediastinal]
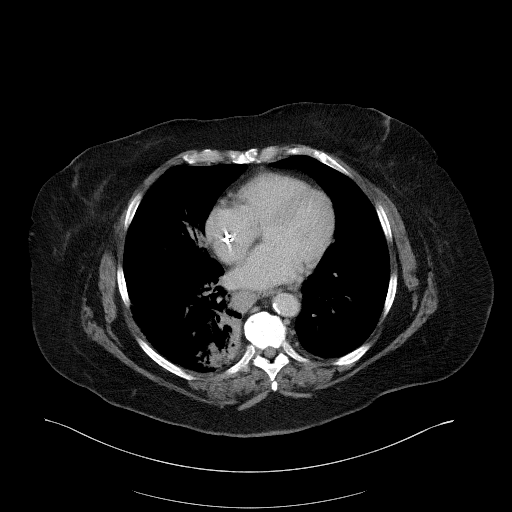
[im 102/125  mediastinal]
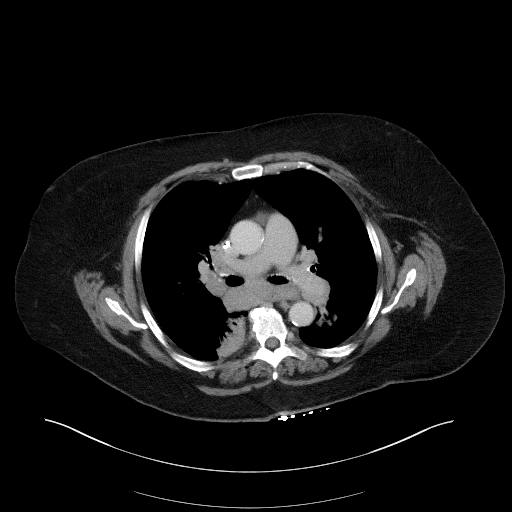
[im 102/125  bone]
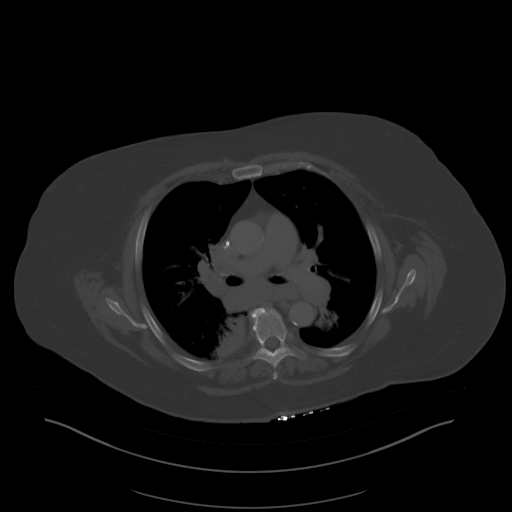
[im 113/125  mediastinal]
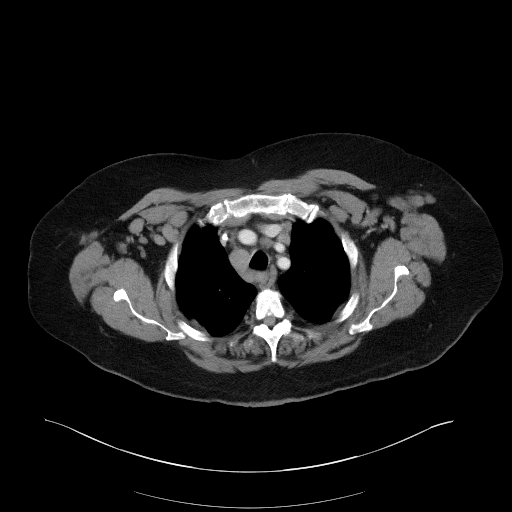

[Series 4: coronals · coronal · 0.91mm/px · 3 of 163 slices shown]
[im 33/163  mediastinal]
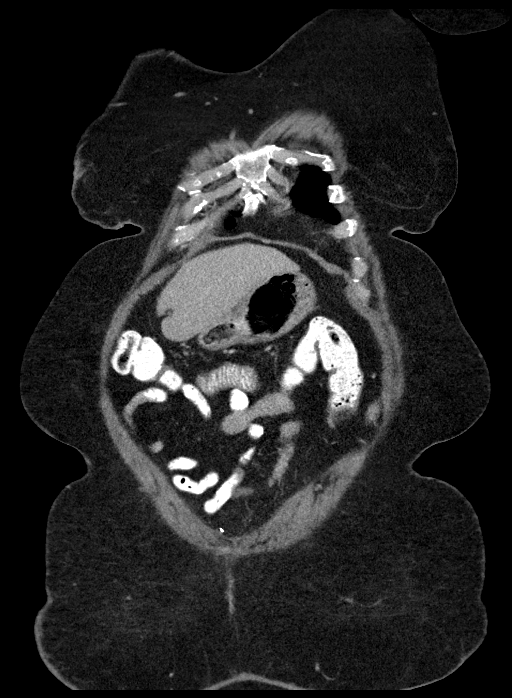
[im 65/163  mediastinal]
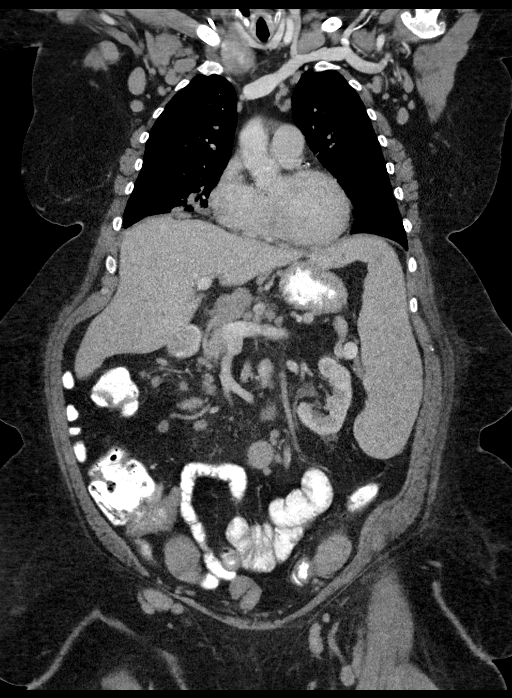
[im 98/163  mediastinal]
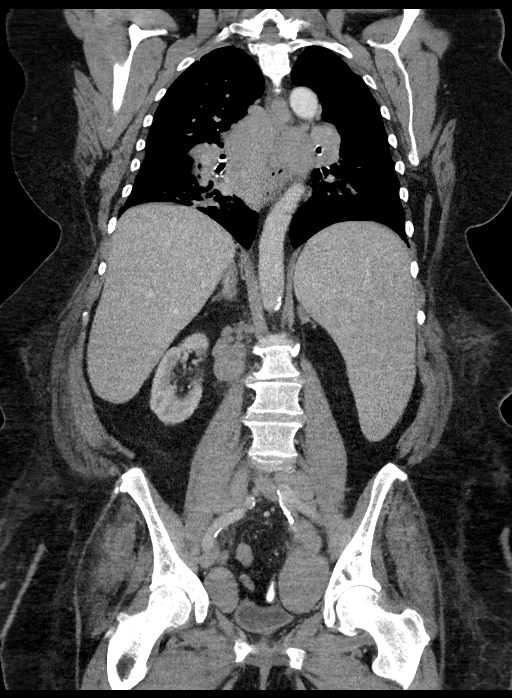

[13 of 36 positions shown; findings below may reference images not displayed]

FINDINGS: CT CHEST FINDINGS

Cardiovascular: Right Port-A-Cath tip at mid right atrium. Bovine
arch. Aortic atherosclerosis. Normal heart size, without pericardial
effusion. Multivessel coronary artery atherosclerosis. No central
pulmonary embolism, on this non-dedicated study.

Mediastinum/Nodes: Left low jugular node measures 1.7 cm on [DATE]
versus similar on the prior exam (when remeasured).

Bilateral axillary adenopathy, with an index left-sided node
measuring 1.2 cm on [DATE] versus 1.0 cm on the prior exam (when
remeasured).

Right paratracheal node with maintenance of its fatty hilum measures
1.5 cm on [DATE] versus 1.3 cm on the prior exam (when remeasured).

Nodal mass within the subcarinal station with extension into the
azygoesophageal recess measures 2.7 cm on [DATE] versus 2.1 cm when
measured at a similar level on the prior exam.

Right infrahilar node measures 2.1 cm on [DATE] versus 1.9 cm on the
prior.

Lungs/Pleura: No pleural fluid. Previously described left upper lobe
consolidation has resolved. Similar right upper lobe
peribronchovascular ground-glass nodularity.

Worsened bibasilar aeration. Right lower lobe primarily dependent
consolidation, at the site of scarring on the prior exam. Example
80/6.

Superior segment left lower lobe peribronchovascular mild
consolidation including on 58/6, new.

Volume loss and peribronchovascular airspace disease within the
inferior right middle lobe, including on 90/6, new.

Musculoskeletal: No acute osseous abnormality.

CT ABDOMEN PELVIS FINDINGS

Hepatobiliary: Mild hepatomegaly at 18.1 cm craniocaudal, chronic.
No focal liver lesion. Cholecystectomy, without biliary ductal
dilatation.

Pancreas: Normal, without mass or ductal dilatation.

Spleen: Splenomegaly at 20.7 cm craniocaudal versus 19.6 cm on the
prior. subcentimeter foci of hypoattenuation within the spleen are
similar, favored to represent lymphomatous involvement.

Adrenals/Urinary Tract: Normal adrenal glands. Normal kidneys,
without hydronephrosis. Normal urinary bladder.

Stomach/Bowel: Normal stomach, without wall thickening. Normal
colon, appendix, and terminal ileum. Normal small bowel.

Vascular/Lymphatic: Advanced aortic and branch vessel
atherosclerosis.

Left periaortic node measures 4.3 cm on 74/2 versus 3.4 cm on the
prior.

Index retrocaval node measures 2.8 cm on 66/2 versus 2.4 cm on the
prior.

Small bowel mesenteric node measures 1.6 cm on 73/2 versus 1.4 cm on
the prior.

Massive pelvic sidewall adenopathy. An index left external iliac
nodal mass measures 4.3 cm short axis on 105/2 versus 3.7 cm on the
prior.

Reproductive: Hysterectomy.  No adnexal mass.

Other: No significant free fluid. Mild pelvic floor laxity. Ventral
pelvic wall laxity contains fat

Musculoskeletal: Thoracolumbar spondylosis.
IMPRESSION: 1. Disease progression, as evidenced by increase in adenopathy
within the chest, abdomen, and pelvis. Persistent
hepatosplenomegaly, with probable splenic involvement.
2. Although the left upper lobe consolidation has resolved, there
are new areas of pulmonary opacity which could represent pulmonary
lymphoma or concurrent pneumonia.
3. Coronary artery atherosclerosis. Aortic Atherosclerosis
([id number]).
